# Patient Record
Sex: Female | Born: 1970 | Race: Black or African American | Hispanic: No | Marital: Single | Smoking: Former smoker
Health system: Southern US, Community
[De-identification: ages and names within clinical notes are randomized; demographics above are authoritative.]

## PROBLEM LIST (undated history)

## (undated) DIAGNOSIS — R011 Cardiac murmur, unspecified: Secondary | ICD-10-CM

## (undated) DIAGNOSIS — IMO0002 Reserved for concepts with insufficient information to code with codable children: Secondary | ICD-10-CM

## (undated) DIAGNOSIS — R87619 Unspecified abnormal cytological findings in specimens from cervix uteri: Secondary | ICD-10-CM

## (undated) DIAGNOSIS — R638 Other symptoms and signs concerning food and fluid intake: Secondary | ICD-10-CM

## (undated) DIAGNOSIS — Z8619 Personal history of other infectious and parasitic diseases: Secondary | ICD-10-CM

## (undated) DIAGNOSIS — A630 Anogenital (venereal) warts: Secondary | ICD-10-CM

## (undated) DIAGNOSIS — Z87898 Personal history of other specified conditions: Secondary | ICD-10-CM

## (undated) DIAGNOSIS — O9081 Anemia of the puerperium: Secondary | ICD-10-CM

## (undated) DIAGNOSIS — R87629 Unspecified abnormal cytological findings in specimens from vagina: Secondary | ICD-10-CM

## (undated) DIAGNOSIS — Z87440 Personal history of urinary (tract) infections: Secondary | ICD-10-CM

## (undated) DIAGNOSIS — J45909 Unspecified asthma, uncomplicated: Secondary | ICD-10-CM

## (undated) DIAGNOSIS — D219 Benign neoplasm of connective and other soft tissue, unspecified: Secondary | ICD-10-CM

## (undated) DIAGNOSIS — G43909 Migraine, unspecified, not intractable, without status migrainosus: Secondary | ICD-10-CM

## (undated) DIAGNOSIS — F53 Postpartum depression: Secondary | ICD-10-CM

## (undated) DIAGNOSIS — N926 Irregular menstruation, unspecified: Secondary | ICD-10-CM

## (undated) DIAGNOSIS — D649 Anemia, unspecified: Secondary | ICD-10-CM

## (undated) DIAGNOSIS — R51 Headache: Secondary | ICD-10-CM

## (undated) DIAGNOSIS — O99345 Other mental disorders complicating the puerperium: Secondary | ICD-10-CM

## (undated) DIAGNOSIS — E669 Obesity, unspecified: Secondary | ICD-10-CM

## (undated) DIAGNOSIS — R102 Pelvic and perineal pain: Secondary | ICD-10-CM

## (undated) DIAGNOSIS — R896 Abnormal cytological findings in specimens from other organs, systems and tissues: Secondary | ICD-10-CM

## (undated) HISTORY — DX: Personal history of urinary (tract) infections: Z87.440

## (undated) HISTORY — DX: Unspecified asthma, uncomplicated: J45.909

## (undated) HISTORY — DX: Postpartum depression: F53.0

## (undated) HISTORY — DX: Abnormal cytological findings in specimens from other organs, systems and tissues: R89.6

## (undated) HISTORY — DX: Obesity, unspecified: E66.9

## (undated) HISTORY — DX: Other symptoms and signs concerning food and fluid intake: R63.8

## (undated) HISTORY — DX: Anemia of the puerperium: O90.81

## (undated) HISTORY — DX: Anogenital (venereal) warts: A63.0

## (undated) HISTORY — DX: Personal history of other specified conditions: Z87.898

## (undated) HISTORY — DX: Pelvic and perineal pain: R10.2

## (undated) HISTORY — DX: Other mental disorders complicating the puerperium: O99.345

## (undated) HISTORY — DX: Personal history of other infectious and parasitic diseases: Z86.19

## (undated) HISTORY — DX: Migraine, unspecified, not intractable, without status migrainosus: G43.909

## (undated) HISTORY — PX: OTHER SURGICAL HISTORY: SHX169

## (undated) HISTORY — DX: Headache: R51

---

## 2000-06-23 ENCOUNTER — Encounter: Admission: RE | Admit: 2000-06-23 | Discharge: 2000-09-21 | Payer: Self-pay | Admitting: Family Medicine

## 2002-01-26 ENCOUNTER — Other Ambulatory Visit: Admission: RE | Admit: 2002-01-26 | Discharge: 2002-01-26 | Payer: Self-pay | Admitting: Family Medicine

## 2002-02-06 ENCOUNTER — Emergency Department (HOSPITAL_COMMUNITY): Admission: EM | Admit: 2002-02-06 | Discharge: 2002-02-06 | Payer: Self-pay | Admitting: Emergency Medicine

## 2002-02-06 ENCOUNTER — Encounter: Payer: Self-pay | Admitting: Emergency Medicine

## 2003-03-17 ENCOUNTER — Other Ambulatory Visit: Admission: RE | Admit: 2003-03-17 | Discharge: 2003-03-17 | Payer: Self-pay | Admitting: Family Medicine

## 2005-02-08 ENCOUNTER — Encounter: Admission: RE | Admit: 2005-02-08 | Discharge: 2005-02-08 | Payer: Self-pay | Admitting: Family Medicine

## 2006-12-23 DIAGNOSIS — IMO0001 Reserved for inherently not codable concepts without codable children: Secondary | ICD-10-CM

## 2006-12-23 HISTORY — DX: Reserved for inherently not codable concepts without codable children: IMO0001

## 2007-01-29 ENCOUNTER — Inpatient Hospital Stay (HOSPITAL_COMMUNITY): Admission: AD | Admit: 2007-01-29 | Discharge: 2007-01-29 | Payer: Self-pay | Admitting: Obstetrics and Gynecology

## 2007-04-07 ENCOUNTER — Inpatient Hospital Stay (HOSPITAL_COMMUNITY): Admission: AD | Admit: 2007-04-07 | Discharge: 2007-04-08 | Payer: Self-pay | Admitting: Obstetrics and Gynecology

## 2007-05-04 ENCOUNTER — Inpatient Hospital Stay (HOSPITAL_COMMUNITY): Admission: AD | Admit: 2007-05-04 | Discharge: 2007-05-04 | Payer: Self-pay | Admitting: Obstetrics and Gynecology

## 2007-07-04 ENCOUNTER — Inpatient Hospital Stay (HOSPITAL_COMMUNITY): Admission: AD | Admit: 2007-07-04 | Discharge: 2007-07-09 | Payer: Self-pay | Admitting: Obstetrics and Gynecology

## 2007-07-04 ENCOUNTER — Encounter (INDEPENDENT_AMBULATORY_CARE_PROVIDER_SITE_OTHER): Payer: Self-pay | Admitting: Obstetrics and Gynecology

## 2007-07-04 HISTORY — PX: TUBAL LIGATION: SHX77

## 2007-07-04 HISTORY — PX: OTHER SURGICAL HISTORY: SHX169

## 2007-07-29 DIAGNOSIS — Z87440 Personal history of urinary (tract) infections: Secondary | ICD-10-CM

## 2007-07-29 HISTORY — DX: Personal history of urinary (tract) infections: Z87.440

## 2007-08-03 ENCOUNTER — Inpatient Hospital Stay (HOSPITAL_COMMUNITY): Admission: AD | Admit: 2007-08-03 | Discharge: 2007-08-03 | Payer: Self-pay | Admitting: Obstetrics and Gynecology

## 2007-08-05 ENCOUNTER — Inpatient Hospital Stay (HOSPITAL_COMMUNITY): Admission: AD | Admit: 2007-08-05 | Discharge: 2007-08-05 | Payer: Self-pay | Admitting: Obstetrics and Gynecology

## 2007-08-08 ENCOUNTER — Inpatient Hospital Stay (HOSPITAL_COMMUNITY): Admission: AD | Admit: 2007-08-08 | Discharge: 2007-08-08 | Payer: Self-pay | Admitting: Obstetrics and Gynecology

## 2007-08-09 ENCOUNTER — Inpatient Hospital Stay (HOSPITAL_COMMUNITY): Admission: AD | Admit: 2007-08-09 | Discharge: 2007-08-09 | Payer: Self-pay | Admitting: Obstetrics and Gynecology

## 2007-08-18 DIAGNOSIS — O9081 Anemia of the puerperium: Secondary | ICD-10-CM

## 2007-08-18 DIAGNOSIS — F53 Postpartum depression: Secondary | ICD-10-CM

## 2007-08-18 HISTORY — DX: Postpartum depression: F53.0

## 2007-08-18 HISTORY — DX: Anemia of the puerperium: O90.81

## 2007-08-24 DIAGNOSIS — Z87898 Personal history of other specified conditions: Secondary | ICD-10-CM

## 2007-08-24 HISTORY — DX: Personal history of other specified conditions: Z87.898

## 2008-07-04 HISTORY — PX: MYOMECTOMY: SHX85

## 2011-05-07 NOTE — H&P (Signed)
NAMECONNER, NEISS NO.:  [id number]   MEDICAL RECORD NO.:  [id number]          PATIENT TYPE:  INP   LOCATION:  9304                          FACILITY:  [facility]   PHYSICIAN:  Hal Morales, M.D.DATE OF BIRTH:  [birth year]   DATE OF ADMISSION:  07/04/2007  DATE OF DISCHARGE:                              HISTORY & PHYSICAL   PRIORITY HISTORY AND PHYSICAL   Ms. Gossett is a 40 year old, gravida 4, para 0-0-3-0, at 59 and 4/7th  weeks, who presented this afternoon with regular uterine contractions  since last night.  She denies any leaking or bleeding and reports  positive fetal movement.  She also denies any headache, visual symptoms,  or epigastric pain.  Pregnancy has been remarkable for:  1)  a large  fibroid in lower uterine segment approximately 12 cm x 10 cm on  ultrasound this week.  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, GC and  Chlamydia cultures were negative, hemoglobin on primary entry into our  practice was 10.4, it was 9.1 in April, group-B Strep culture was  negative at 36 weeks, first trimester screening was normal, AFT was  normal, one-hour Glucola was 84 at 18 weeks, followup at 28 weeks was  normal, a 24-hour urine was done at 23 to 24 weeks secondary to a mild  elevation in blood pressure, she had a fetofibronectin done at 25 weeks  for cramping, the cervix was also shortened at that time, LFTs were  slightly elevated on April 11th at 46 SGOT and 53 SGPT, hepatitis  screening was normal, GC and Chlamydia cultures were both negative at 25  weeks, followup liver function tests showed SGOT at 52, SGPT was  negative, she had a one-hour GTT at 26 weeks that was normal, TSH was  done at 27 weeks and it was 2.052, fetofibronectin was negative on May  12th.  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  She had an ultrasound earlier this week  showing transverse lie and a large fibroid.   HISTORY OF PRESENT PREGNANCY:  Patient entered care at approximately  five-and-a-half to six weeks gestation.  She had an ultrasound showing  growth with an EDC of August 4th.  That was her first return at new OB  visit.  No fibroids were noted at that time; however, she did have high-  risk HPV noted  on her Pap with ASCUS.  Her mother did pass away during  her pregnancy at 12 weeks.  She did have some depression early but  declined any medication.  She had a colposcopy at 14 weeks.  Plan was  made to repeat her Pap at 36 weeks.  She received IV fluids at 14 weeks.  She had an ultrasound at 18 weeks showing normal growth, she had a  positive echogenic intracardiac focus, she had two large fibroids of 12  cm and 9 cm, one-hour Glucola was normal.  She had cramping at 22 weeks.  She was placed on Vicodin at that time.  She had a slightly elevated  blood pressure at 23 weeks.  Labs showed elevated LFTs.  A 24-hour urine  was done and was normal.  She had another ultrasound at 24 weeks showing  a lower uterine segment anterior fibroid.  The cervix was 1.81 cm  dilated and 1.43 cm with funneling.  She received febrofibonectin at  that time and betamethasone was given.  Hepatitis testing was done and  was negative.  GC and Chlamydia  cultures were negative at 25 weeks.  Her  LFTs were repeated and SGPT was normal, SGOT was still slightly elevated  at 52.  Fetofibronectin was negative at 24 weeks.  She was out of work  during that time when her cervix was shortened.  She was allowed to  return to work at approximately 26 weeks.  One-hour Glucola and LFTs  were done at 26 weeks.  Her one-hour Glucola was normal, her TSH was  normal, LFTs had been resolved.  She was on some terbutaline at 30  weeks.  She had a negative fetofibronectin at that time.  The cervix was  long, closed, and high.  She did desire a tubal sterilization.  This was  reviewed at 30 week with the patient.  She was having lots of pelvic  pain at 32 weeks.  Urine was sent to culture.  She had an ultrasound at  32 weeks showing a transverse lie, fibroids were 14 cm noted, the cervix  was 2.72 cm.  She had Trichomonas diagnosed at that time on wet prep.  She had all STD testing done, which showed a positive HSV-1 and HSV-2  titers, but no lesions.  She was given Ambien at 33 weeks.  Urine  culture was negative.  She had an ultrasound this past week showing  normal growth, but one fibroid was noted that was 12 x 10 cm long.  A  C-  section was discussed with the patient at that time, but no definitive  plans had been made.   OBSTETRICAL HISTORY:  In 1992, she had a termination at six to eight  weeks.  In 1999, she had a termination at eight weeks.  In 2003, she had  a termination at six weeks.   PAST MEDICAL HISTORY:  1. She is a previous pills and Depo-Provera user.  2. She was diagnosed with anemia several years ago but has never been      on any iron supplements.  3. She has had a recent bladder infection.  4. She has migraines and has been on several meds in the past but has      not used any medications during her pregnancy.  5. The patient was a previous smoker.   The patient has no known medication allergies.   FAMILY HISTORY:  Her mother,  father, sister, an aunt, and some first  cousins all have hypertension.  Her mother had varicosities.  Her  maternal aunt had diabetes.  Her mother had breast cancer and did pass  away during the pregnancy.  Her father has had prostate cancer.   GENETIC HISTORY:  Remarkable in the patient having a heart murmur at  birth.  The father of the baby has fathered twins before, and the  patient's sister has twins.   SOCIAL HISTORY:  The patient is African-American, she is of the  Apostolic faith, she is single, the father of the baby has been  sporadically involved, the patient has two years of college, she is a  Clinical biochemist rep, her partner is a Technical sales engineer.  She has been followed  initially by the certified midwife nurse service but did transfer to the  physician service during her pregnancy.  She denies any alcohol, drug,  or tobacco use during this pregnancy.   PHYSICAL EXAMINATION:  VITAL SIGNS:  Stable; the patient is afebrile.  HEENT:  Within normal limits.  LUNGS:  Her breath sounds are clear.  HEART:  Regular rate and rhythm without murmur.  BREASTS:  Soft and nontender.  ABDOMEN:  Fundal height is approximately 42 cm, and the fetal weight 6.5  to 7.5 pounds.  The fetus is transverse by a bedside ultrasound.  The  cervix is fingertip, 50%, with the presenting part high, fetal heart  rate was initially reactive but then was nonreactive after patient  received Stadol.  There were no decelerations noted.  Uterine  contractions initially were approximately every 4 minutes and remained  so despite Stadol and IV hydration.  Group-B Strep culture was negative  at 36 weeks.  There are no HSV lesions noted.  Hemoglobin today is 10.6.   IMPRESSION:  1. Intrauterine pregnancy at 70 and 4/7th weeks.  2. Probable early labor.  3. Transverse lie.  4. Large fibroid.  5. Group B Streptococcus negative.  6. Desires tubal sterilization.   PLAN:  1. Admit to [facility]  with a consult by Dr.      Pennie Rushing as attending physician.  2. Routine physician preoperative orders.  3. Dr. Pennie Rushing reviewed with the patient and her family the risks and      benefits of C-section including bleeding, infection, and damage to      other organs.  C-section was recommended in light of the patient's      transverse lie and probable early labor as well as the large      fibroid.  Dr. Pennie Rushing also reviewed with the patient the risks of      bleeding from the fibroid during the C-section, and a Hold-A-Clot      was also obtained.      Renaldo Reel Emilee Hero, C.N.M.      Hal Morales, M.D.  Electronically Signed    VLL/MEDQ  D:  07/04/2007  T:  07/05/2007  Job:  [id number]

## 2011-05-07 NOTE — Consult Note (Signed)
Ellen Hill, MAHN NO.:  [id number]   MEDICAL RECORD NO.:  [id number]          PATIENT TYPE:  MAT   LOCATION:  MATC                          FACILITY:  [facility]   PHYSICIAN:  Janine Limbo, M.D.DATE OF BIRTH:  05/05/1971   DATE OF CONSULTATION:  08/03/2007  DATE OF DISCHARGE:  08/03/2007                                 CONSULTATION   HISTORY OF PRESENT ILLNESS:  The patient is a 40 year old female, now  para 0-1-3-1, who has been followed at the [facility] for women.  The the patient had a  classical cesarean section with bilateral tubal ligation and myomectomy  on July 05, 2007.  She presented to [facility] at that time in  labor at 36 weeks and 5 days.  Her infant was noted to be in a  transverse lie.  The patient was noted to have several large fibroids  measuring 10 x 10 cm and 12 x 10 cm.  A myomectomy was required to stop  her bleeding.  The patients postoperative hemoglobin was 9.3.  A repeat  hemoglobin was 7.6.  The patient was discharged to home doing well.  The  patient reports that for the past week she has had increasing right  lower quadrant pain.  She denies fever and chills.  She denies any  drainage from her incision.  She was seen in the office 1 week ago and  was noted to have a healing incision.  However, upon presentation to the  office today she was noted to have some induration at her incision and  significant right lower quadrant pain.   OBSTETRICAL HISTORY:  On July 05, 2007 the patient had a classical  cesarean section, tubal ligation, and myomectomy at 36 weeks and 5 days  gestation.  She had three first trimester elective pregnancy  terminations (1992, 1999, 2003).   DRUG ALLERGIES:  NO KNOWN DRUG ALLERGIES.   PAST MEDICAL HISTORY:  The patient has a history of anemia even when she  is not pregnant.  She has a history of migraine headaches.  She has a  history of obesity.  Her  height is 5 feet 6 inches.  Her weight during  her pregnancy was noted to be as high as 330 pounds.  The patient has a  past history of herpes virus.  She has fibroids as mentioned in the  history of present illness.   SOCIAL HISTORY:  The patient denies smoking when she was pregnant but  she was a previous smoker.  She denies alcohol use and recreational drug  use.   REVIEW OF SYSTEMS:  Please see history of present illness.  She has  tolerated a regular diet.  She denies constipation, diabetes, nausea,  and vomiting.   FAMILY HISTORY:  Is noncontributory.   PHYSICAL EXAM:  VITAL SIGNS:  Temperature is 92, blood pressure is  136/99, pulse 96, respirations 22.  HEENT:  Within normal limits.  CHEST:  Clear.  HEART:  Regular rate and rhythm.  ABDOMEN:  Soft and there is tenderness in  the right lower quadrant.  She  has a very large pannus and when you lift the pannus you can see a well-  healed incision.  There is no evidence of infection for discharge.  There is some brawny induration at the incision and there is some edema  at the mons area where there is dependent fluid.  EXTREMITIES:  Grossly normal.  NEUROLOGY:  Grossly normal.  PELVIC:  Deferred.   Weight is 301 pounds today.   LABORATORY VALUES:  Hemoglobin is 8.0.  White blood cell count is  12,100, platelet count of 389,000.   EMERGENCY ROOM COURSE:  The patient was monitored in the emergency  department.  Her blood work was done.  The patient stated that she had  borrowed a friend's car and that she had to leave to pick up her child.  I had discussed trying to do an ultrasound or CT scan to better evaluate  the integrity of her incision and the subcutaneous area.  The patient  reported that she was unable to stay and that she would plan to return  to the office on August 04, 2007 for evaluation at that time.   ASSESSMENT:  1. Right lower quadrant pain of uncertain etiology.  2. Possible skin infection.  3.  Obesity.  4. Status post classical cesarean section, bilateral tubal ligation,      and myomectomy on July 05, 2007.  5. Significant anemia (consistent with anemia following her cesarean      section).   PLAN:  The patient was told to follow-up in the office as she has  dictated.   DISCHARGE MEDICATIONS:  1. She was given a prescription for Keflex and she will take 500 mg      three times each day for 10 days.  2. She was given a prescription for ibuprofen and she will take 600 mg      every 6 hours as needed for pain.  3. She was given a prescription for Percocet 5/325 and she will take 1-      2 tablets every 4 hours as needed for pain.   The patient was told to call and return to maternity admissions tonight  if she finds that her symptoms worsen.      Janine Limbo, M.D.  Electronically Signed     AVS/MEDQ  D:  08/03/2007  T:  08/04/2007  Job:  [id number]

## 2011-05-07 NOTE — Discharge Summary (Signed)
Ellen Hill, Ellen Hill NO.:  [id number]   MEDICAL RECORD NO.:  [id number]          PATIENT TYPE:  INP   LOCATION:  9304                          [facility]   PHYSICIAN:  Hal Morales, M.D.DATE OF BIRTH:  [birth year]   DATE OF ADMISSION:  07/04/2007  DATE OF DISCHARGE:  07/09/2007                               DISCHARGE SUMMARY   ADMISSION DIAGNOSIS:  1. Intrauterine pregnancy at 36-4/7 weeks.  2. Early labor.  3. Transverse lie.  4. Large fibroid.  5. Group B strep negative.   DISCHARGE DIAGNOSES:  1. Intrauterine pregnancy at 36-4/7 weeks.  2. Early labor.  3. Transverse lie.  4. Large fibroid.  5. Group B strep negative.  6. Two large anterior fibroids.  7. Desires sterilization.  8. Elevated liver function tests.  9. NICU infant.   HOSPITAL PROCEDURES:  1. Electronic fetal monitoring.  2. Primary classical cesarean section.  3. Multiple myomectomy.  4. Bilateral tubal ligation.   HOSPITAL COURSE:  The patient was admitted with contractions and she had  a known large fibroid on her uterus and a transverse presentation of her  baby and decision was made to proceed with cesarean section for delivery  with myomectomy. Upon entering the abdominal cavity two large anterior  fibroids, weighing a total of 949 grams were noted and had to be removed  in order to deliver the baby. Classical cesarean section incision had to  be done on the uterus in order to achieve delivery. Bilateral tubal  ligation was also done.  There were no complications.  Baby a female  baby was delivered weighing 4 pounds 9 ounces, Apgars nine and nine. The  patient was taken to recovery and then to the third floor and the baby  was taken to NICU. On postop day #1 the patient was doing well.  She was  afebrile.  JP was draining a small amount of blood. Chemistry showed an  elevated AST of 161 and an ALT of 191.  Other labs were normal.  She had  some low urine output  secondary to dehydration that was treated with  fluids. On postop day #2 she was doing well, infant was stable in the  NICU, pain was well controlled and IV was discontinued and the patient  got shower. She declined transfusion for hemoglobin of 7.6. Postop day  #3 the patient continued to do well, baby was stable in the NICU.  She  was afebrile.  AST went down 49 and ALT went down to 76, exam was within  normal limits and she had some eczema rash that was treated with  hydrocortisone cream. On postop day #4 the baby was still in NICU and  was stable, orthostatic vital signs were normal and physical exam was  normal.  She continued to receive routine care.  On postop day #5 she  was doing well and ready to go home.  Baby was going to be going home  today, vital signs were stable.  She was afebrile.  Abdomen was soft and  nontender.  Chest was clear.  Heart  rate; regular rate and rhythm.  Incision clean, dry and intact.  Staples were removed with Steri-Strips  placed.  JP drain was removed.  Lochia was within normal limits.  Extremities were within normal limits except for 1+ edema. CMET was  normal except for AST of 74 and ALT of 77. The patient was deemed to  receive full benefit of her hospital stay and was discharged home.   DISCHARGE MEDICATIONS:  1. Motrin 600 mg p.o. q.6 hours p.r.n.  2. Tylox 1-2 p.o. q.4 hours p.r.n.  3. Lasix 20 mg one p.o. daily.  4. Potassium chloride 10 mEq one p.o. daily.   DISCHARGE LABS:  Sodium 142, potassium 3.4, creatinine 0.62, AST 74, ALT  77.  CBC showed a hemoglobin 6.9, platelets 193.   DISCHARGE INSTRUCTIONS:  Per [facility] handout.  Discharge follow up in 6  weeks or p.r.n.      Marie L. Williams, C.N.M.      Hal Morales, M.D.  Electronically Signed    MLW/MEDQ  D:  07/09/2007  T:  07/09/2007  Job:  [id number]

## 2011-05-07 NOTE — Op Note (Signed)
Ellen Hill, COLCORD                ACCOUNT NO.:  [id number]   MEDICAL RECORD NO.:  [id number]          PATIENT TYPE:  INP   LOCATION:  9304                          [facility]:  [facility]   PHYSICIAN:  Hal Morales, M.D.DATE OF BIRTH:  Jun 25, 1971   DATE OF PROCEDURE:  07/04/2007  DATE OF DISCHARGE:                               OPERATIVE REPORT   PREOPERATIVE DIAGNOSES:  Intrauterine pregnancy at 36 weeks and 5 days,  preterm labor transverse fetal lie, desire for surgical sterilization,  uterine fibroids and pelvic pain.   POSTOPERATIVE DIAGNOSES:  Intrauterine pregnancy at 36 weeks and 5 days,  preterm labor transverse fetal lie, desire for surgical sterilization,  uterine fibroids and pelvic pain.   OPERATION:  Multiple myomectomy, primary classical cesarean section.   SURGEON:  Dr. Dierdre Forth   FIRST ASSISTANT:  Dr. Christin Bach   SECOND ASSISTANT:  Erin Sons, certified nurse midwife.   ANESTHESIA:  Epidural   ESTIMATED BLOOD LOSS:  1000 mL.   COMPLICATIONS:  None.   FINDINGS:  The uterus was enlarged for the gravid state.  On the  anterior surface of the uterus obstructing the entire anterior fundus  were two large myoma, the first measuring 10 x 10 cm and the second  measuring 12 x 10 cm.  The tubes and ovaries were normal for the gravid  state.  The patient was delivered of a female infant weighing 4 pounds  and 9 ounces with Apgars of 9 and 9 at 1 and 5 minutes respectively.  There were several other very small myomata throughout the uterine  fundus.   PROCEDURE:  The patient was taken to the operating room after  appropriate identification and placed on the operating table.  After the  placement of an epidural anesthetic she was placed in the supine  position with a left lateral tilt.  The abdomen was taped to allow  exposure to the suprapubic region.  The abdomen and perineum were  prepped multiple layers of Betadine and a Foley catheter inserted into  the bladder and connected to straight drainage.  The abdomen was draped  as a sterile field.  After the assurance of adequate surgical anesthesia  the suprapubic region was infiltrated with 20 mL of quarter percent  Marcaine.  A suprapubic incision was made and the abdomen opened in  layers.  The peritoneum was entered and upon visualizing the large  myomata were obstructing the anterior uterus.  It was clear that  myomectomy would have to be performed in order to allow for delivery.  The serosa of the myomas were infiltrated with a dilute solution of  Pitressin and incised.  The myoma were shelled out of their overlying  serosa and a Kelly clamp placed over the base of the pedicle attaching  the myomata to the anterior uterus.  The myomata were then removed from  the operative field.  The pedicles were oversewn with figure-of-eight  sutures of 0 Vicryl to allow for adequate hemostasis.  It was noted that  there was no presenting part in the lower uterine segment and that the  lower uterine segment was very poorly developed with minimal width for  an incision.  It was decided to proceed with a classical incision which  was made in the midline of the fundus.  This extended toward the bladder  flap.  The membranes were then ruptured with the egress of clear fluid.  The infant was delivered from a frank breech position, having been  transverse on entry into the uterus.  The nares and pharynx were  suctioned.  The cord clamped and cut.  She was handed off to the  awaiting pediatricians.  The placenta was manually removed from the  uterus.  Uterine cavity was cleared of products of conception.  The  uterine incision was then closed in three layers the first figure-of-  eight sutures.  A second figure-of-eight mattress sutures and the third  a running interlocking serosal suture of all of 0 Vicryl.  Further  hemostasis was then achieved along the entire incision with interrupted  mattress  sutures of 0 Vicryl.  The left fallopian tube was then  identified, followed to its fimbriated end, and grasped at the isthmic  portion and elevated.  A suture of 2-0 chromic was placed through the  mesosalpinx and tied fore and aft on the knuckle of tube.  A second  ligature was placed proximal to that.  The intervening knuckle of tube  was excised in the cut ends cauterized.  A similar procedure was carried  out on the opposite side.  The portions of right and left tube were  removed from the operative field.  At this time the uterus which had  been exteriorized from the abdominal incision was replaced and copious  irrigation carried out.  A last hemostatic suture was placed of 0 Vicryl  along the C-section incision line and hemostasis deemed been adequate.  A piece of intercede was placed along the entire length of the uterine  incision.  The abdominal peritoneum was then closed with a running  suture of 2-0 Vicryl.  The rectus fascia was closed with running sutures  of 0 Vicryl from each apex to the midline and tied in the midline.  The  subcutaneous tissue was copiously irrigated and made hemostatic with  Bovie cautery.  A 10 mm Jackson-Pratt drain was placed through a stab  wound in the left lower quadrant.  It was sewn in with a suture of 0-0  silk.  The skin incision was then closed with staples.  A grenade was  attached to the drain.  Sterile dressings were applied and the patient  taken from the operating room to the recovery room in satisfactory  condition having tolerated the procedure well with sponge and instrument  counts correct.   SPECIMENS TO PATHOLOGY:  Two large myomas and the placenta.  The infant  went to the full-term nursery.  Initially, however, was transferred to  the neonatal intensive care unit.      Hal Morales, M.D.  Electronically Signed     VPH/MEDQ  D:  07/05/2007  T:  07/05/2007  Job:  [id number]

## 2011-05-10 NOTE — H&P (Signed)
NAMEASHBY, LEFLORE NO.:  [id number]   MEDICAL RECORD NO.:  [id number]          PATIENT TYPE:  INP   LOCATION:  9155                          FACILITY:  [facility]   PHYSICIAN:  Janine Limbo, M.D.DATE OF BIRTH:  Aug 23, 1971   DATE OF ADMISSION:  04/07/2007  DATE OF DISCHARGE:                              HISTORY & PHYSICAL   Ms. Lewers is a 40 year old gravida 4, para 0-0-3-0 who presents at 23-  1/[redacted] weeks gestation, EDD July 28, 2007.  She presents from the office  of [facility] with complaint of contractions and cramping.  Ultrasound  examination done today finds cervical length shortened at 1.81 cm with  funneling. The patient was found to have elevated liver function tests  on April 03, 2007 with SGOT 46 and SGPT 53.  Her blood pressures in  maternity admissions have been normal.  She is admitted for continuous  monitoring.  She is to receive betamethasone 12.5 mg today to be  repeated in 24 hours. She will have a gallbladder ultrasound on  admission secondary to elevated LFTs. Her pregnancy has been followed by  the M.D. service at [facility] and is remarkable for:  1. Obesity.  The patient's weight is 332 pounds, height is 5 feet 6-      1/2 inches.  2. History of three elective AB's.  3. Fibroids.  4. Migraines.  5. Elevated liver function tests.  6. Shortened cervix on ultrasound with funneling.   This patient began prenatal care at the office of [facility] on December 02, 2007 at [redacted] weeks gestation, [facility] determined by first trimester ultrasound  and confirmed with follow-up.  Her pregnancy has been remarkable for  fibroids with increased cramping and pain.  She has had proteinuria and  isolated elevated blood pressures.   PRENATAL LAB WORK:  On December 01, 2006 hemoglobin and hematocrit 10.4  and 32.8, platelets 299,000.  Blood type and Rh A+, antibody screen  negative, VDRL nonreactive, rubella immune, hepatitis B surface antigen  negative, HIV nonreactive.  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  CF testing is negative.   OB HISTORY:  In 1992 the patient had a first trimester elective AB with  no complications. In 1999 first trimester elective AB with no  complications. In 2003 the patient had a first trimester elective AB  with no complications.  This is her fourth and current pregnancy.   MEDICAL HISTORY:  Is significant for fibroids, migraines and obesity.   FAMILY HISTORY:  The patient's mother, father, sister and aunts with  chronic hypertension. The patient's mother with varicose veins. The  patient's mother has breast cancer and father has prostate cancer.   GENETIC HISTORY.:  There is no genetic history of familial or  chromosomal disorders, children that were born with birth defects or any  that died in infancy.   ALLERGIES:  The patient has no known drug allergies.   She stopped cigarette smoking with positive EPT and denies the use of  alcohol and illicit drugs.   SOCIAL HISTORY:  Ms. Barley is a  40 year old African American single  female.  She works as a Occupational psychologist. The father of  the baby, Araceli Bouche is a Technical sales engineer. He is involved and supportive.  They are Apostolic in their faith.   REVIEW OF SYSTEMS:  Is as described above.  The patient is 24 weeks and  1 day gestation with shortened cervix to 1.81 cm on ultrasound  examination today at the office of [facility] with funneling. She complains of  cramping and has a history of elevated liver function tests and is here  for evaluation.   PHYSICAL EXAM:  VITAL SIGNS:  The patient is afebrile.  Blood pressure  117/79.  HEENT:  Unremarkable.  HEART:  Regular rate and rhythm.  LUNGS:  Clear.  ABDOMEN:  Soft and nontender.  It is gravid in its contour.  Uterine  fundus is noted to extend 24 cm above the level of the pubic symphysis.  Baseline of fetal heart rate monitor is 140-150 with positive  variability and is reassuring. There is negative  CVA tenderness noted  bilaterally.  Digital exam of the cervix done by Dr. Normand Sloop at the  office of [facility] found the cervix to be closed. Fetal fibronectin was  obtained.  EXTREMITIES:  Show no pathologic edema.  DTRs are 1+ with no clonus.  There is no calf tenderness noted bilaterally.   ASSESSMENT:  Intrauterine pregnancy at [redacted] weeks gestation, shortened  cervix to 1.81 cm with funneling on ultrasound, abdominal cramping,  elevated liver function tests.   PLAN:  Admit per Dr. Marline Backbone for continuous monitoring,  betamethasone 12.5 mg IM today and repeat in 24 hours. The patient will  begin Motrin 800 mg p.o. q.8 h for cramping.  She is admitted for  observation.      Rica Koyanagi, C.N.M.      Janine Limbo, M.D.  Electronically Signed    SDM/MEDQ  D:  04/07/2007  T:  04/08/2007  Job:  [id number]

## 2011-05-10 NOTE — Discharge Summary (Signed)
Ellen Hill, Ellen Hill NO.:  [id number]   MEDICAL RECORD NO.:  [id number]          PATIENT TYPE:  MAT   LOCATION:  [facility]                          [facility]ILITY:  [facility]   PHYSICIAN:  Janine Limbo, M.D.DATE OF BIRTH:  November 01, 1971   DATE OF ADMISSION:  05/04/2007  DATE OF DISCHARGE:  05/04/2007                               DISCHARGE SUMMARY   DISCHARGE DIAGNOSES:  1. Intrauterine gestation at [redacted] weeks gestation.  2. No evidence of preterm labor.  3. Preterm uterine irritability.  4. Fibroid uterus.   PROCEDURES THIS ADMISSION:  Bone.   HISTORY OF PRESENT ILLNESS:  Ellen Hill is a 40 year old female, gravida  4, para 0-3-0, who presented at [redacted] weeks gestation complaining of lower  abdominal cramping and vaginal pain of 1 day duration.  The patient has  been followed at the [facility]  of [facility].  The patient's pregnancy is  significant for a shortened cervix for which she received betamethasone  at [redacted] weeks gestation.  She was hospitalized for 2 days and observation.  She has a past history of 3 elective pregnancy terminations.  She has a  known history of fibroids.   PHYSICAL EXAMINATION:  VITAL SIGNS:  Stable and the patient is afebrile.  Fetal monitoring showed a assuring fetal heart rate tracing bit it was  difficult to trace the infant because of the body habitus of the  patient.  She was noted to have mild irritability of the uterus.   EMERGENCY ROOM COURSE:  The patient was tested for a urinary tract  infection and it was noted to be negative.  Her fetal fibronectin was  negative.  An ultrasound was performed that showed a transverse infant.  The cervix was 4.22 cm in length.  The amniotic fluid volume was normal.  The biophysical profile was 8 out of 8.  The patient was noted to have  two large fibroids, each measuring greater than 10 cm in size.  The  patient was given subcu terbutaline and  the uterine irritability  resolved.  After observation for several hours we felt that the patient  was ready for discharge.  She will follow up in the office or call  should she have any further problems.  She will take terbutaline 5 mg  every 4 to 6 hours as needed for uterine cramping.      Janine Limbo, M.D.  Electronically Signed     AVS/MEDQ  D:  05/22/2007  T:  05/22/2007  Job:  [id number]

## 2011-05-20 ENCOUNTER — Ambulatory Visit (INDEPENDENT_AMBULATORY_CARE_PROVIDER_SITE_OTHER): Payer: Self-pay

## 2011-05-20 ENCOUNTER — Inpatient Hospital Stay (INDEPENDENT_AMBULATORY_CARE_PROVIDER_SITE_OTHER)
Admission: RE | Admit: 2011-05-20 | Discharge: 2011-05-20 | Disposition: A | Discharge status: Home / Self Care | Payer: Self-pay | Source: Ambulatory Visit | Attending: Family Medicine | Admitting: Family Medicine

## 2011-05-20 DIAGNOSIS — J45909 Unspecified asthma, uncomplicated: Secondary | ICD-10-CM

## 2011-08-02 ENCOUNTER — Emergency Department (HOSPITAL_BASED_OUTPATIENT_CLINIC_OR_DEPARTMENT_OTHER)
Admission: EM | Admit: 2011-08-02 | Discharge: 2011-08-02 | Disposition: A | Discharge status: Home / Self Care | Payer: Self-pay | Attending: Emergency Medicine | Admitting: Emergency Medicine

## 2011-08-02 DIAGNOSIS — N898 Other specified noninflammatory disorders of vagina: Secondary | ICD-10-CM | POA: Insufficient documentation

## 2011-08-02 DIAGNOSIS — R109 Unspecified abdominal pain: Secondary | ICD-10-CM | POA: Insufficient documentation

## 2011-08-02 DIAGNOSIS — N939 Abnormal uterine and vaginal bleeding, unspecified: Secondary | ICD-10-CM

## 2011-08-02 LAB — URINALYSIS, ROUTINE W REFLEX MICROSCOPIC
Bilirubin Urine: NEGATIVE
Ketones, ur: NEGATIVE mg/dL
Leukocytes, UA: NEGATIVE
Nitrite: NEGATIVE
Protein, ur: NEGATIVE mg/dL

## 2011-08-02 LAB — BASIC METABOLIC PANEL
BUN: 12 mg/dL (ref 6–23)
Calcium: 9.4 mg/dL (ref 8.4–10.5)
Creatinine, Ser: 0.7 mg/dL (ref 0.50–1.10)
GFR calc Af Amer: >60 mL/min (ref >60)
GFR calc non Af Amer: >60 mL/min (ref >60)

## 2011-08-02 LAB — CBC
MCH: 22.9 pg — ABNORMAL LOW (ref 26.0–34.0)
MCHC: 31.6 g/dL (ref 30.0–36.0)
MCV: 72.3 fL — ABNORMAL LOW (ref 78.0–100.0)
Platelets: 215 10*3/uL (ref 150–400)
RDW: 16.7 % — ABNORMAL HIGH (ref 11.5–15.5)
WBC: 7.3 10*3/uL (ref 4.0–10.5)

## 2011-08-02 MED ORDER — HYDROCODONE-ACETAMINOPHEN 7.5-325 MG PO TABS: 1.0000 | ORAL_TABLET | Freq: Four times a day (QID) | ORAL | Status: AC | PRN

## 2011-08-02 NOTE — ED Notes (Signed)
Pt c/o abdominal pain and heavy vaginal bleeding that started "after my child was born in 2008" and symptoms have worsened the past 6 months.  Pt has not seen an OBGYN and she states she bleeds daily.

## 2011-08-02 NOTE — ED Provider Notes (Signed)
History     CSN: [id number] Arrival date & time: 08/02/2011  6:03 PM  Chief Complaint  Patient presents with  . Abdominal Pain  . Vaginal Bleeding   HPI Comments: Pt reports since 2008 she has been having increasing vaginal bleeding. She saturates multiple pads and tampons. She passes clots almost daily. She has cramping sharpe pain in the lower left abd. She has not been evaluated for this problem. She came to ED tonight because family required her to come. She c/o feeling weak and lightheaded at times.  Patient is a 40 y.o. female presenting with abdominal pain and vaginal bleeding. The history is provided by the patient.  Abdominal Pain The primary symptoms of the illness include abdominal pain and vaginal bleeding. The primary symptoms of the illness do not include shortness of breath or dysuria. The current episode started more than 2 days ago. The onset of the illness was gradual. The problem has not changed since onset. Vaginal bleeding was first noticed more than 2 days ago. Vaginal bleeding other than menses is a chronic problem. Vaginal bleeding occurred more than 10 times. The quantity of blood was heavier than menses.  The patient has not had a change in bowel habit. Additional symptoms associated with the illness include diaphoresis. Symptoms associated with the illness do not include chills, anorexia, hematuria, frequency or back pain. Significant associated medical issues do not include PUD, GERD, inflammatory bowel disease, diabetes, gallstones, liver disease, diverticulitis, HIV or cardiac disease.  Vaginal Bleeding Associated symptoms include abdominal pain and diaphoresis. Pertinent negatives include no anorexia, arthralgias, chest pain, chills, coughing or neck pain.    History reviewed. No pertinent past medical history.  Past Surgical History  Procedure Date  . Fibroidecto   . Fibroidectomy   . Tubal ligation   . Cesarean section     History reviewed. No pertinent  family history.  History  Substance Use Topics  . Smoking status: Never Smoker   . Smokeless tobacco: Not on file  . Alcohol Use: No    OB History    Grav Para Term Preterm Abortions TAB SAB Ect Mult Living                  Review of Systems  Constitutional: Positive for diaphoresis. Negative for chills and activity change.       All ROS Neg except as noted in HPI  HENT: Negative for nosebleeds and neck pain.   Eyes: Negative for photophobia and discharge.  Respiratory: Negative for cough, shortness of breath and wheezing.   Cardiovascular: Negative for chest pain and palpitations.  Gastrointestinal: Positive for abdominal pain. Negative for blood in stool and anorexia.  Genitourinary: Positive for vaginal bleeding. Negative for dysuria, frequency and hematuria.  Musculoskeletal: Negative for back pain and arthralgias.  Skin: Negative.   Neurological: Negative for dizziness, seizures and speech difficulty.  Psychiatric/Behavioral: Negative for hallucinations and confusion.    Physical Exam  BP 154/103  Pulse 92  Temp(Src) 99.4 F (37.4 C) (Oral)  Resp 20  Ht 5\' 6"  (1.676 m)  Wt 370 lb (167.831 kg)  BMI 59.72 kg/m2  SpO2 100%  Physical Exam  Nursing note and vitals reviewed. Constitutional: She is oriented to person, place, and time. She appears well-developed and well-nourished.  Non-toxic appearance.  HENT:  Head: Normocephalic.  Right Ear: Tympanic membrane and external ear normal.  Left Ear: Tympanic membrane and external ear normal.  Eyes: EOM and lids are normal. Pupils are equal, round, and  reactive to light.  Neck: Normal range of motion. Neck supple. Carotid bruit is not present.  Cardiovascular: Normal rate, regular rhythm, normal heart sounds, intact distal pulses and normal pulses.   Pulmonary/Chest: Breath sounds normal. No respiratory distress.  Abdominal: Soft. Bowel sounds are normal. There is tenderness.       Left upper and lower abd tenderness.    Genitourinary:       Left cvat. Chaperone present during pelvic exam. External vaginal area wnl. No wall motion tenderness. No mass of adnexa. Can not asses size of uterus due to body habitus.  Musculoskeletal: Normal range of motion.  Lymphadenopathy:       Head (right side): No submandibular adenopathy present.       Head (left side): No submandibular adenopathy present.    She has no cervical adenopathy.  Neurological: She is alert and oriented to person, place, and time. She has normal strength. No cranial nerve deficit or sensory deficit.  Skin: Skin is warm and dry.  Psychiatric: She has a normal mood and affect. Her speech is normal.    ED Course  Procedures  MDM I have reviewed nursing notes, vital signs, and all appropriate lab and imaging results for this patient.      Kathie Dike, [place] 08/02/11 2206

## 2011-08-05 LAB — GC/CHLAMYDIA PROBE AMP, GENITAL: Chlamydia, DNA Probe: NEGATIVE

## 2011-08-11 NOTE — ED Provider Notes (Signed)
Medical screening examination/treatment/procedure(s) were performed by non-physician practitioner and as supervising physician I was immediately available for consultation/collaboration.   Bannon Giammarco, MD 08/11/11 0912 

## 2011-10-07 LAB — COMPREHENSIVE METABOLIC PANEL
ALT: 62 — ABNORMAL HIGH
AST: 46 — ABNORMAL HIGH
AST: 74 — ABNORMAL HIGH
Albumin: 1.8 — ABNORMAL LOW
Albumin: 2 — ABNORMAL LOW
Alkaline Phosphatase: 76
Alkaline Phosphatase: 78
BUN: 3 — ABNORMAL LOW
BUN: 5 — ABNORMAL LOW
CO2: 25
Calcium: 8 — ABNORMAL LOW
Calcium: 8 — ABNORMAL LOW
Chloride: 107
Chloride: 110
Chloride: 112
Creatinine, Ser: 0.58
Creatinine, Ser: 0.62
GFR calc Af Amer: >60
GFR calc Af Amer: >60
GFR calc non Af Amer: >60
Glucose, Bld: 69 — ABNORMAL LOW
Glucose, Bld: 72
Potassium: 3.4 — ABNORMAL LOW
Potassium: 3.6
Sodium: 139
Total Bilirubin: 0.3
Total Bilirubin: 0.3
Total Bilirubin: 0.3

## 2011-10-07 LAB — CBC
HCT: 22.6 — ABNORMAL LOW
HCT: 25.2 — ABNORMAL LOW
Hemoglobin: 7.1 — CL
Hemoglobin: 6.9 — CL
MCHC: 32.5
MCV: 71.2 — ABNORMAL LOW
MCV: 71.6 — ABNORMAL LOW
Platelets: 387
RBC: 3.53 — ABNORMAL LOW
RBC: 2.83 — ABNORMAL LOW
RDW: 17.8 — ABNORMAL HIGH
WBC: 12.1 — ABNORMAL HIGH
WBC: 8.1
WBC: 5.3

## 2011-10-08 LAB — CBC
HCT: 29 — ABNORMAL LOW
Hemoglobin: 10.6 — ABNORMAL LOW
Hemoglobin: 9.3 — ABNORMAL LOW
MCHC: 32.1
MCHC: 32.5
MCV: 74.3 — ABNORMAL LOW
MCV: 75.7 — ABNORMAL LOW
MCV: 76.1 — ABNORMAL LOW
Platelets: 162
RBC: 3.82 — ABNORMAL LOW
RBC: 4.45
RDW: 16.5 — ABNORMAL HIGH
WBC: 7.9

## 2011-10-08 LAB — COMPREHENSIVE METABOLIC PANEL
AST: 161 — ABNORMAL HIGH
AST: 84 — ABNORMAL HIGH
Albumin: 2.6 — ABNORMAL LOW
Alkaline Phosphatase: 133 — ABNORMAL HIGH
CO2: 20
CO2: 25
Calcium: 8.1 — ABNORMAL LOW
Chloride: 109
Creatinine, Ser: 0.59
GFR calc Af Amer: >60
GFR calc Af Amer: >60
GFR calc non Af Amer: >60
Potassium: 3.8
Total Bilirubin: 0.5

## 2011-10-08 LAB — URINALYSIS, ROUTINE W REFLEX MICROSCOPIC
Hgb urine dipstick: NEGATIVE
Nitrite: NEGATIVE
Specific Gravity, Urine: 1.02
Urobilinogen, UA: 1
pH: 6.5

## 2011-10-08 LAB — RPR: RPR Ser Ql: NONREACTIVE

## 2011-11-01 ENCOUNTER — Other Ambulatory Visit: Payer: Self-pay | Admitting: Family Medicine

## 2011-11-01 DIAGNOSIS — R52 Pain, unspecified: Secondary | ICD-10-CM

## 2011-11-01 DIAGNOSIS — N920 Excessive and frequent menstruation with regular cycle: Secondary | ICD-10-CM

## 2011-11-05 ENCOUNTER — Ambulatory Visit
Admission: RE | Admit: 2011-11-05 | Discharge: 2011-11-05 | Disposition: A | Discharge status: Home / Self Care | Payer: Medicaid Other | Source: Ambulatory Visit | Attending: Family Medicine | Admitting: Family Medicine

## 2011-11-05 DIAGNOSIS — N920 Excessive and frequent menstruation with regular cycle: Secondary | ICD-10-CM

## 2011-11-05 DIAGNOSIS — R52 Pain, unspecified: Secondary | ICD-10-CM

## 2012-02-13 DIAGNOSIS — R102 Pelvic and perineal pain: Secondary | ICD-10-CM

## 2012-02-13 DIAGNOSIS — E669 Obesity, unspecified: Secondary | ICD-10-CM

## 2012-02-13 HISTORY — DX: Obesity, unspecified: E66.9

## 2012-02-13 HISTORY — DX: Pelvic and perineal pain: R10.2

## 2012-03-02 ENCOUNTER — Encounter (INDEPENDENT_AMBULATORY_CARE_PROVIDER_SITE_OTHER): Payer: Medicaid Other | Admitting: Obstetrics and Gynecology

## 2012-03-02 DIAGNOSIS — N926 Irregular menstruation, unspecified: Secondary | ICD-10-CM

## 2012-03-02 DIAGNOSIS — N949 Unspecified condition associated with female genital organs and menstrual cycle: Secondary | ICD-10-CM

## 2012-03-07 ENCOUNTER — Observation Stay (HOSPITAL_COMMUNITY)
Admission: AD | Admit: 2012-03-07 | Discharge: 2012-03-09 | Disposition: A | Discharge status: Home / Self Care | Payer: Medicaid Other | Source: Ambulatory Visit | Attending: Obstetrics and Gynecology | Admitting: Obstetrics and Gynecology

## 2012-03-07 ENCOUNTER — Inpatient Hospital Stay (HOSPITAL_COMMUNITY): Payer: Medicaid Other

## 2012-03-07 ENCOUNTER — Encounter (HOSPITAL_COMMUNITY): Payer: Self-pay | Admitting: Obstetrics and Gynecology

## 2012-03-07 DIAGNOSIS — R109 Unspecified abdominal pain: Secondary | ICD-10-CM | POA: Insufficient documentation

## 2012-03-07 DIAGNOSIS — E669 Obesity, unspecified: Secondary | ICD-10-CM

## 2012-03-07 DIAGNOSIS — N920 Excessive and frequent menstruation with regular cycle: Secondary | ICD-10-CM

## 2012-03-07 DIAGNOSIS — D649 Anemia, unspecified: Secondary | ICD-10-CM

## 2012-03-07 HISTORY — DX: Irregular menstruation, unspecified: N92.6

## 2012-03-07 HISTORY — DX: Benign neoplasm of connective and other soft tissue, unspecified: D21.9

## 2012-03-07 LAB — DIFFERENTIAL
Basophils Absolute: 0 10*3/uL (ref 0.0–0.1)
Basophils Relative: 0 % (ref 0–1)
Lymphocytes Relative: 35 % (ref 12–46)
Monocytes Absolute: 0.4 10*3/uL (ref 0.1–1.0)
Neutro Abs: 3.6 10*3/uL (ref 1.7–7.7)
Neutrophils Relative %: 57 % (ref 43–77)

## 2012-03-07 LAB — CBC
HCT: 31.5 % — ABNORMAL LOW (ref 36.0–46.0)
MCHC: 30.8 g/dL (ref 30.0–36.0)
RDW: 17.2 % — ABNORMAL HIGH (ref 11.5–15.5)
WBC: 6.3 10*3/uL (ref 4.0–10.5)

## 2012-03-07 MED ORDER — SODIUM CHLORIDE 0.9 % IV SOLN: 250.0000 mL | INTRAVENOUS | Status: DC | PRN

## 2012-03-07 MED ORDER — SODIUM CHLORIDE 0.9 % IJ SOLN
3.0000 mL | Freq: Two times a day (BID) | INTRAMUSCULAR | Status: DC
  Administered 2012-03-07 – 2012-03-08 (×2): 3 mL via INTRAVENOUS

## 2012-03-07 MED ORDER — ONDANSETRON HCL 4 MG/2ML IJ SOLN
4.0000 mg | Freq: Four times a day (QID) | INTRAMUSCULAR | Status: DC | PRN
  Administered 2012-03-08 – 2012-03-09 (×3): 4 mg via INTRAVENOUS
  Filled 2012-03-07 (×3): qty 2

## 2012-03-07 MED ORDER — ZOLPIDEM TARTRATE 10 MG PO TABS
10.0000 mg | ORAL_TABLET | Freq: Every evening | ORAL | Status: DC | PRN
  Administered 2012-03-07 – 2012-03-08 (×2): 10 mg via ORAL
  Filled 2012-03-07 (×2): qty 1

## 2012-03-07 MED ORDER — IBUPROFEN 600 MG PO TABS
600.0000 mg | ORAL_TABLET | Freq: Four times a day (QID) | ORAL | Status: DC | PRN
  Administered 2012-03-08: 600 mg via ORAL
  Filled 2012-03-07: qty 1

## 2012-03-07 MED ORDER — OXYCODONE-ACETAMINOPHEN 5-325 MG PO TABS
1.0000 | ORAL_TABLET | Freq: Four times a day (QID) | ORAL | Status: DC | PRN
  Administered 2012-03-07: 2 via ORAL
  Administered 2012-03-08: 1 via ORAL
  Administered 2012-03-08 – 2012-03-09 (×2): 2 via ORAL
  Filled 2012-03-07 (×2): qty 2
  Filled 2012-03-07: qty 1
  Filled 2012-03-07: qty 2

## 2012-03-07 MED ORDER — TRAMADOL HCL 50 MG PO TABS
50.0000 mg | ORAL_TABLET | Freq: Four times a day (QID) | ORAL | Status: DC | PRN
  Filled 2012-03-07: qty 2

## 2012-03-07 MED ORDER — SODIUM BICARBONATE 8.4 % IV SOLN
INTRAVENOUS | Status: AC
  Filled 2012-03-07: qty 50

## 2012-03-07 MED ORDER — LIDOCAINE HCL (PF) 1 % IJ SOLN
INTRAMUSCULAR | Status: AC
  Filled 2012-03-07: qty 5

## 2012-03-07 MED ORDER — HYDROCODONE-ACETAMINOPHEN 5-325 MG PO TABS: 1.0000 | ORAL_TABLET | Freq: Four times a day (QID) | ORAL | Status: DC | PRN

## 2012-03-07 MED ORDER — ALBUTEROL SULFATE HFA 108 (90 BASE) MCG/ACT IN AERS
2.0000 | INHALATION_SPRAY | Freq: Four times a day (QID) | RESPIRATORY_TRACT | Status: DC | PRN
  Administered 2012-03-07: 2 via RESPIRATORY_TRACT
  Filled 2012-03-07: qty 6.7

## 2012-03-07 MED ORDER — SODIUM CHLORIDE 0.9 % IJ SOLN
3.0000 mL | INTRAMUSCULAR | Status: DC | PRN
  Administered 2012-03-08: 3 mL via INTRAVENOUS

## 2012-03-07 MED ORDER — SIMETHICONE 80 MG PO CHEW: 80.0000 mg | CHEWABLE_TABLET | Freq: Four times a day (QID) | ORAL | Status: DC | PRN

## 2012-03-07 MED ORDER — ESTROGENS CONJUGATED 25 MG IJ SOLR
25.0000 mg | Freq: Four times a day (QID) | INTRAMUSCULAR | Status: DC
  Administered 2012-03-07 – 2012-03-08 (×4): 25 mg via INTRAVENOUS
  Filled 2012-03-07 (×4): qty 25

## 2012-03-07 MED ORDER — ALBUTEROL 90 MCG/ACT IN AERS: 2.0000 | INHALATION_SPRAY | Freq: Four times a day (QID) | RESPIRATORY_TRACT | Status: DC | PRN

## 2012-03-07 MED ORDER — TRAMADOL HCL 50 MG PO TABS
50.0000 mg | ORAL_TABLET | Freq: Four times a day (QID) | ORAL | Status: DC | PRN
  Administered 2012-03-07: 100 mg via ORAL

## 2012-03-07 MED ORDER — ONDANSETRON HCL 4 MG PO TABS: 4.0000 mg | ORAL_TABLET | Freq: Four times a day (QID) | ORAL | Status: DC | PRN

## 2012-03-07 NOTE — MAU Provider Note (Signed)
History     CSN: [id number]  Arrival date and time: 03/07/12 1246   First Provider Initiated Contact with Patient 03/07/12 1344      Chief Complaint  Patient presents with  . Fibroids  . Abdominal Pain   HPI Comments: Pt is a 40yo G1P1 that presents with DUB. Pt seen in office on Monday and started on provera 20mg  then on Thursday switched to OCP and has had no improvement in bleeding, reports passing clots and saturating a pad or 2 per hour. C/O dizziness with standing. Pt also c/o severe abdominal cramping with no relief from hydrocodone.    Abdominal Pain Associated symptoms include constipation.      Past Medical History  Diagnosis Date  . Fibroids   . Irregular bleeding     Past Surgical History  Procedure Date  . Fibroidecto   . Fibroidectomy   . Tubal ligation   . Cesarean section     History reviewed. No pertinent family history.  History  Substance Use Topics  . Smoking status: Never Smoker   . Smokeless tobacco: Not on file  . Alcohol Use: No    Allergies: No Known Allergies  Prescriptions prior to admission  Medication Sig Dispense Refill  . albuterol (PROVENTIL,VENTOLIN) 90 MCG/ACT inhaler Inhale 2 puffs into the lungs every 6 (six) hours as needed. Shortness of breath and wheezing       . HYDROcodone-acetaminophen (NORCO) 5-325 MG per tablet Take 1 tablet by mouth every 6 (six) hours as needed.      Marland Kitchen ibuprofen (ADVIL,MOTRIN) 200 MG tablet Take 400 mg by mouth every 6 (six) hours as needed. Pain        . Multiple Vitamin (MULTIVITAMIN) tablet Take 1 tablet by mouth daily.        . norethindrone-ethinyl estradiol (BALZIVA) 0.4-35 MG-MCG tablet Take 1 tablet by mouth daily. Take as directed      . zolpidem (AMBIEN) 5 MG tablet Take 5 mg by mouth at bedtime as needed. For sleep        Review of Systems  Gastrointestinal: Positive for abdominal pain and constipation.  Neurological: Positive for dizziness and weakness.  All other systems reviewed  and are negative.   Physical Exam   Blood pressure 140/75, pulse 97, temperature 98.1 F (36.7 C), resp. rate 18, last menstrual period 01/23/2012.  Physical Exam  Nursing note and vitals reviewed. Constitutional: She is oriented to person, place, and time. She appears well-developed and well-nourished. No distress.  HENT:  Head: Normocephalic.  Neck: Normal range of motion.  Cardiovascular: Normal rate, regular rhythm and normal heart sounds.   Respiratory: Effort normal and breath sounds normal.  GI: Soft. Bowel sounds are normal. She exhibits no distension. There is tenderness in the right lower quadrant and left lower quadrant.  Genitourinary: Vagina normal. Cervix exhibits no motion tenderness.       Bimanual - cx firm, unable to palpate uterus secondary to body habitus, slightly tender with exam   Musculoskeletal: Normal range of motion. She exhibits no edema and no tenderness.  Neurological: She is alert and oriented to person, place, and time.  Skin: Skin is warm and dry.  Psychiatric: She has a normal mood and affect. Her behavior is normal.    MAU Course  Procedures    Assessment and Plan  DUB - non-responsive to provera and OCP  Will draw CBC Check orthostatics - stable D/W DR. Dillard - she will come see pt  Ellen Hill 03/07/2012,  1:49 PM

## 2012-03-07 NOTE — MAU Note (Signed)
Vaginal bleeding since 1/30 medication to stop the bleeding is not helping (Provera) and pain medication does not help.

## 2012-03-07 NOTE — H&P (Signed)
Ellen Hill is an 41 y.o. female. whp presents with heavy vaginal bleeding.  She is soaking more than one pad in an hour.  No chest pain or sob.  She has been bleeding off and on for the last two months. She has tried provera 20 mg qd and and ocp taper without relief.  She is very frustrated and desires a hysterectomy  Pertinent Gynecological History: Menses: flow is excessive with use of 24 pads or tampons on heaviest days Bleeding: dysfunctional uterine bleeding Contraception: BTL DES exposure: unknown Blood transfusions: none Sexually transmitted diseases: na Previous GYN Procedures: myomectomy and classical cesarean  Last mammogram: na Date:  Last pap: normal Date: 2013 OB History: G1, P1   Menstrual History: Menarche age: unknown Patient's last menstrual period was 01/23/2012.    Past Medical History  Diagnosis Date  . Fibroids   . Irregular bleeding     Past Surgical History  Procedure Date  . Fibroidecto   . Fibroidectomy   . Tubal ligation   . Cesarean section     History reviewed. No pertinent family history.  Social History:  reports that she has never smoked. She does not have any smokeless tobacco history on file. She reports that she does not drink alcohol or use illicit drugs.  Allergies: No Known Allergies  Prescriptions prior to admission  Medication Sig Dispense Refill  . albuterol (PROVENTIL,VENTOLIN) 90 MCG/ACT inhaler Inhale 2 puffs into the lungs every 6 (six) hours as needed. Shortness of breath and wheezing       . HYDROcodone-acetaminophen (NORCO) 5-325 MG per tablet Take 1 tablet by mouth every 6 (six) hours as needed.      Marland Kitchen ibuprofen (ADVIL,MOTRIN) 200 MG tablet Take 400 mg by mouth every 6 (six) hours as needed. Pain        . Multiple Vitamin (MULTIVITAMIN) tablet Take 1 tablet by mouth daily.        . norethindrone-ethinyl estradiol (BALZIVA) 0.4-35 MG-MCG tablet Take 1 tablet by mouth daily. Take as directed      . zolpidem (AMBIEN) 5 MG  tablet Take 5 mg by mouth at bedtime as needed. For sleep         ROS Review of Systems - History obtained from the patient General ROS: negative Hematological and Lymphatic ROS: negative Respiratory ROS: no cough, shortness of breath, or wheezing Cardiovascular ROS: no chest pain or dyspnea on exertion Genito-Urinary ROS: no dysuria, trouble voiding, or hematuria  Blood pressure 140/75, pulse 97, temperature 98.1 F (36.7 C), resp. rate 18, last menstrual period 01/23/2012. Physical Exam Physical Examination: General appearance - alert, well appearing, and in no distress Neck - supple, no significant adenopathy Chest - clear to auscultation, no wheezes, rales or rhonchi, symmetric air entry Heart - normal rate, regular rhythm, normal S1, S2, no murmurs, rubs, clicks or gallops Abdomen - soft, nontender, nondistended, no masses or organomegaly Breasts - breasts appear normal, no suspicious masses, no skin or nipple changes or axillary nodes Pelvic - normal external genitalia, vulva, vagina, cervix, uterus and adnexa, pt has heavy vb Neurological - alert, oriented, normal speech, no focal findings or movement disorder noted Musculoskeletal - no joint tenderness, deformity or swelling Extremities - peripheral pulses normal, no pedal edema, no clubbing or cyanosis Skin - normal coloration and turgor, no rashes, no suspicious skin lesions noted   Results for orders placed during the hospital encounter of 03/07/12 (from the past 24 hour(s))  CBC     Status: Abnormal  Collection Time   03/07/12  1:24 PM      Component Value Range   WBC 6.3  4.0 - 10.5 (K/uL)   RBC 4.35  3.87 - 5.11 (MIL/uL)   Hemoglobin 9.7 (*) 12.0 - 15.0 (g/dL)   HCT 16.1 (*) 09.6 - 46.0 (%)   MCV 72.4 (*) 78.0 - 100.0 (fL)   MCH 22.3 (*) 26.0 - 34.0 (pg)   MCHC 30.8  30.0 - 36.0 (g/dL)   RDW 04.5 (*) 40.9 - 15.5 (%)   Platelets 261  150 - 400 (K/uL)  DIFFERENTIAL     Status: Normal   Collection Time   03/07/12   1:24 PM      Component Value Range   Neutrophils Relative 57  43 - 77 (%)   Neutro Abs 3.6  1.7 - 7.7 (K/uL)   Lymphocytes Relative 35  12 - 46 (%)   Lymphs Abs 2.2  0.7 - 4.0 (K/uL)   Monocytes Relative 6  3 - 12 (%)   Monocytes Absolute 0.4  0.1 - 1.0 (K/uL)   Eosinophils Relative 2  0 - 5 (%)   Eosinophils Absolute 0.1  0.0 - 0.7 (K/uL)   Basophils Relative 0  0 - 1 (%)   Basophils Absolute 0.0  0.0 - 0.1 (K/uL)    No results found.  Assessment/Plan: mennorhagia Anemia Pt failed out patient treatment She is not a candidate for endometrial ablation because of history of classical cesarean and myomectomy Pt considering [place] and hysterectomy.  Because of BMI and past surgeries will [facility] to [facility] or [facility] if she chooses hysterectomy Will give iv premarin and try ocps. If pt is refractory will do D&C Pt understands risks of DVT or VTE with iv premarin  Nadea Kirkland A 03/07/2012, 2:35 PM

## 2012-03-08 DIAGNOSIS — D649 Anemia, unspecified: Secondary | ICD-10-CM

## 2012-03-08 DIAGNOSIS — N920 Excessive and frequent menstruation with regular cycle: Secondary | ICD-10-CM

## 2012-03-08 DIAGNOSIS — E669 Obesity, unspecified: Secondary | ICD-10-CM

## 2012-03-08 LAB — CBC
MCH: 22.1 pg — ABNORMAL LOW (ref 26.0–34.0)
Platelets: 242 10*3/uL (ref 150–400)
RBC: 3.76 MIL/uL — ABNORMAL LOW (ref 3.87–5.11)
RDW: 17.3 % — ABNORMAL HIGH (ref 11.5–15.5)

## 2012-03-08 MED ORDER — ESTROGENS CONJUGATED 25 MG IJ SOLR
25.0000 mg | Freq: Four times a day (QID) | INTRAMUSCULAR | Status: AC
  Administered 2012-03-08: 25 mg via INTRAVENOUS
  Filled 2012-03-08: qty 25

## 2012-03-08 MED ORDER — NORETHINDRONE-ETH ESTRADIOL 0.4-35 MG-MCG PO TABS
1.0000 | ORAL_TABLET | Freq: Four times a day (QID) | ORAL | Status: DC
  Administered 2012-03-08 – 2012-03-09 (×3): 1 via ORAL
  Filled 2012-03-08 (×3): qty 1

## 2012-03-08 MED ORDER — FERROUS SULFATE 325 (65 FE) MG PO TABS
325.0000 mg | ORAL_TABLET | Freq: Two times a day (BID) | ORAL | Status: DC
  Administered 2012-03-08 – 2012-03-09 (×2): 325 mg via ORAL
  Filled 2012-03-08 (×2): qty 1

## 2012-03-08 MED ORDER — DOCUSATE SODIUM 100 MG PO CAPS
100.0000 mg | ORAL_CAPSULE | Freq: Two times a day (BID) | ORAL | Status: DC
  Administered 2012-03-08 – 2012-03-09 (×3): 100 mg via ORAL
  Filled 2012-03-08 (×3): qty 1

## 2012-03-08 NOTE — Progress Notes (Signed)
Subjective: Patient reports nausea, tolerating PO and no problems voiding.   Bleeding has slowed down.  Pt with hot flashes and cramping only made better with percocet  Objective: I have reviewed patient's vital signs, intake and output, labs and radiology results.  General: alert and cooperative Resp: clear to auscultation bilaterally Cardio: regular rate and rhythm, S1, S2 normal, no murmur, click, rub or gallop GI: soft, non-tender; bowel sounds normal; no masses,  no organomegaly Extremities: extremities normal, atraumatic, no cyanosis or edema and Homans sign is negative, no sign of DVT Vaginal Bleeding: minimal  Ultrasound uterus normal size with thickened endometrium CBC    Component Value Date/Time   WBC 7.9 03/08/2012 0531   RBC 3.76* 03/08/2012 0531   HGB 8.3* 03/08/2012 0531   HCT 27.4* 03/08/2012 0531   PLT 242 03/08/2012 0531   MCV 72.9* 03/08/2012 0531   MCH 22.1* 03/08/2012 0531   MCHC 30.3 03/08/2012 0531   RDW 17.3* 03/08/2012 0531   LYMPHSABS 2.2 03/07/2012 1324   MONOABS 0.4 03/07/2012 1324   EOSABS 0.1 03/07/2012 1324   BASOSABS 0.0 03/07/2012 1324    Assessment/Plan: Menorrhagia with anemia Give one more dose of iv premarin then start pill taper Return to my office on 3/20 as scheduled for EMBx and SHG Pt considering [place] will plan for that as well Continue iron  LOS: 1 day    Ha Placeres A 03/08/2012, 12:26 PM

## 2012-03-09 DIAGNOSIS — D649 Anemia, unspecified: Secondary | ICD-10-CM

## 2012-03-09 DIAGNOSIS — N920 Excessive and frequent menstruation with regular cycle: Secondary | ICD-10-CM

## 2012-03-09 DIAGNOSIS — E669 Obesity, unspecified: Secondary | ICD-10-CM

## 2012-03-09 MED ORDER — DSS 100 MG PO CAPS: 100.0000 mg | ORAL_CAPSULE | Freq: Two times a day (BID) | ORAL | Status: AC

## 2012-03-09 MED ORDER — NORETHINDRONE-ETH ESTRADIOL 0.4-35 MG-MCG PO TABS: 1.0000 | ORAL_TABLET | Freq: Four times a day (QID) | ORAL | Status: DC

## 2012-03-09 MED ORDER — IBUPROFEN 600 MG PO TABS: 600.0000 mg | ORAL_TABLET | Freq: Four times a day (QID) | ORAL | Status: DC | PRN

## 2012-03-09 MED ORDER — FERROUS SULFATE 325 (65 FE) MG PO TABS: 325.0000 mg | ORAL_TABLET | Freq: Two times a day (BID) | ORAL | Status: DC

## 2012-03-09 MED ORDER — ONDANSETRON HCL 4 MG PO TABS: 4.0000 mg | ORAL_TABLET | Freq: Four times a day (QID) | ORAL | Status: AC | PRN

## 2012-03-09 NOTE — Discharge Instructions (Signed)
Call [facility] @ [phone] if:  You have a temperature greater than or equal to 100.4 degrees Farenheit orally You have pain that is not made better by the pain medication given and taken as directed You have excessive bleeding (soaking a pad each hour) You experience chest pain, shortness of breath, leg pain or swelling or headache that causes you to lose your vision  Take Colace (Docusate Sodium/Stool Softener) 100 mg 2-3 times daily while taking narcotic  pain medicine and iron supplements to avoid constipation or until bowel movements are regular.  Remember to eat a light meal and to take pain medications prior to your office visit on Wednesday In preparation for an endometrial biopsy as explained to you.

## 2012-03-09 NOTE — Progress Notes (Signed)
Post discharge review completed. 

## 2012-03-09 NOTE — Progress Notes (Signed)
Ellen Hill is a40 y.o.  [id number]  Subjective: Patient is Doing well.  Only reports mile abdominal achiness-no cramping, and resolution of nausea. Patient denies leg pain/swelling, headache, chest pain or shortness of breath. Bowel  & bladder function regular, tolerating regular diet and ambulating without assistance.   Objective: Vital signs in last 24 hours: Temp:  [97.4 F (36.3 C)-98.6 F (37 C)] 97.4 F (36.3 C) (03/18 0538) Pulse Rate:  [69-80] 80  (03/18 0538) Resp:  [18] 18  (03/18 0538) BP: (118-129)/(71-78) 120/75 mmHg (03/18 0538) SpO2:  [100 %] 100 % (03/18 0538)  Intake/Output from previous day: 03/17 0701 - 03/18 0700 In: 10.2 [I.V.:10.2] Out: -  Intake/Output this shift:    Lab 03/08/12 0531 03/07/12 1324  WBC 7.9 6.3  HGB 8.3* 9.7*  HCT 27.4* 31.5*  PLT 242 261    No results found for this basename: NA:3,K:3,CL:3,CO2:3,BUN:3,CREATININE:3,CALCIUM:3,LABALBU:3,PROT:3,BILITOT:3,ALKPHOS:3,ALT:3,AST:3,GLUCOSE:3 in the last 168 hours  EXAM: Resp: clear to auscultation bilaterally Cardio: regular rate and rhythm GI: soft, non-tender; bowel sounds normal; no masses,  no organomegaly Extremities: Homans sign is negative, no sign of DVT Vaginal Bleeding: none on perineal pad SCD hose in place and functioning  Assessment: s/p : stable, progressing well and anemia  Plan: Discharge home [facility] taper  LOS: 2 days    Pharoah Goggins,  PA-C 03/09/2012 7:56 AM

## 2012-03-09 NOTE — Discharge Summary (Signed)
  Physician Discharge Summary  Patient ID: NAMI STRAWDER MRN: [id number] DOB/AGE: April 14, 1971 41 y.o.  Admit date: 03/07/2012 Discharge date: 03/09/2012  Admission Diagnoses: abd pn heavy bleeding fibroids  Discharge Diagnoses: abd pn heavy bleeding fibroids        Active Problems:  Menorrhagia Morbid Obesity  Discharged Condition: Stable  Hospital Course: On date of admission patient was placed on IV Premarin for management of menorrhagia and anemia.  Patient's bleeding abated with this treatment and was started on a 35 mcg oral contraceptive taper.  By hospital day #2 patient had received the maximum benefit of her hospitial stay and was deemed ready for discharge home with outpatient follow-up with Dr. Normand Sloop in two days.   Disposition: Home  Discharge Orders    Future Appointments: Provider: Department: Dept Phone: Center:   03/11/2012 2:00 PM [facility] [phone] None   03/11/2012 2:15 PM Naima A Dillard, MD [facility] [phone] None     Medication List  As of 03/09/2012  8:17 AM   TAKE these medications         albuterol 90 MCG/ACT inhaler   Commonly known as: PROVENTIL,VENTOLIN   Inhale 2 puffs into the lungs every 6 (six) hours as needed. Shortness of breath and wheezing      DSS 100 MG Caps   Take 100 mg by mouth 2 (two) times daily.      ferrous sulfate 325 (65 FE) MG tablet   Take 1 tablet (325 mg total) by mouth 2 (two) times daily with a meal.      HYDROcodone-acetaminophen 5-325 MG per tablet   Commonly known as: NORCO   Take 1 tablet by mouth every 6 (six) hours as needed.      multivitamin tablet   Take 1 tablet by mouth daily.      norethindrone-ethinyl estradiol 0.4-35 MG-MCG tablet   Commonly known as: OVCON-35,BALZIVA,BRIELLYN   Take 1 tablet by mouth 4 (four) times daily. X 2 days then tid x 2 days then bid  X 2 days then qd      ondansetron 4 MG tablet   Commonly known as: ZOFRAN   Take 1 tablet (4 mg total) by mouth every 6  (six) hours as needed for nausea.      zolpidem 5 MG tablet   Commonly known as: AMBIEN   Take 5 mg by mouth at bedtime as needed. For sleep         ASK your doctor about these medications         ibuprofen 200 MG tablet   Commonly known as: ADVIL,MOTRIN   Take 400 mg by mouth every 6 (six) hours as needed. Pain             Follow-up Information    Follow up with [facility], MD in 2 days.   Contact information:   [address] [place] [address] [phone]          Signed: Patrick Jupiter 03/09/2012, 8:17 AM

## 2012-03-10 ENCOUNTER — Other Ambulatory Visit: Payer: Self-pay | Admitting: Obstetrics and Gynecology

## 2012-03-10 DIAGNOSIS — D219 Benign neoplasm of connective and other soft tissue, unspecified: Secondary | ICD-10-CM

## 2012-03-11 ENCOUNTER — Encounter (INDEPENDENT_AMBULATORY_CARE_PROVIDER_SITE_OTHER): Payer: Medicaid Other

## 2012-03-11 ENCOUNTER — Encounter (INDEPENDENT_AMBULATORY_CARE_PROVIDER_SITE_OTHER): Payer: Medicaid Other | Admitting: Obstetrics and Gynecology

## 2012-03-11 DIAGNOSIS — N926 Irregular menstruation, unspecified: Secondary | ICD-10-CM

## 2012-03-11 DIAGNOSIS — D250 Submucous leiomyoma of uterus: Secondary | ICD-10-CM

## 2012-03-11 DIAGNOSIS — E669 Obesity, unspecified: Secondary | ICD-10-CM

## 2012-03-17 ENCOUNTER — Ambulatory Visit
Admission: RE | Admit: 2012-03-17 | Discharge: 2012-03-17 | Disposition: A | Discharge status: Home / Self Care | Payer: Medicaid Other | Source: Ambulatory Visit | Attending: Obstetrics and Gynecology | Admitting: Obstetrics and Gynecology

## 2012-03-17 DIAGNOSIS — D219 Benign neoplasm of connective and other soft tissue, unspecified: Secondary | ICD-10-CM

## 2012-03-17 HISTORY — DX: Anemia, unspecified: D64.9

## 2012-03-17 NOTE — Progress Notes (Signed)
LMP:  01/23/2012 w/ continued flow through hospitalization of 03/07/2012-03/09/2012.  Pt wasplaced on IV Premarin for management of menorrhagia and anemia.  Bleeding abated w/ Rx, & pt was then started on a 35 mcg oral contraceptive taper.    Pt describes heavy flow w/ clots, cramping pain, abd swelling & bloating.  States that she has been taking Oxycodone as needed for pain.   Hgb:  8.3 on 03/08/2012.  Pt taking Rx Fe 325 mg bid.

## 2012-03-18 ENCOUNTER — Other Ambulatory Visit: Payer: Self-pay | Admitting: Obstetrics and Gynecology

## 2012-03-18 DIAGNOSIS — D219 Benign neoplasm of connective and other soft tissue, unspecified: Secondary | ICD-10-CM

## 2012-03-19 ENCOUNTER — Ambulatory Visit
Admission: RE | Admit: 2012-03-19 | Discharge: 2012-03-19 | Disposition: A | Discharge status: Home / Self Care | Payer: Medicaid Other | Source: Ambulatory Visit | Attending: Obstetrics and Gynecology | Admitting: Obstetrics and Gynecology

## 2012-03-19 DIAGNOSIS — D219 Benign neoplasm of connective and other soft tissue, unspecified: Secondary | ICD-10-CM

## 2012-03-19 MED ORDER — GADOBENATE DIMEGLUMINE 529 MG/ML IV SOLN
20.0000 mL | Freq: Once | INTRAVENOUS | Status: AC | PRN
  Administered 2012-03-19: 20 mL via INTRAVENOUS

## 2012-03-23 ENCOUNTER — Other Ambulatory Visit: Payer: Self-pay | Admitting: Interventional Radiology

## 2012-03-23 DIAGNOSIS — D219 Benign neoplasm of connective and other soft tissue, unspecified: Secondary | ICD-10-CM

## 2012-04-01 ENCOUNTER — Other Ambulatory Visit: Payer: Self-pay | Admitting: Obstetrics and Gynecology

## 2012-04-01 ENCOUNTER — Ambulatory Visit
Admission: RE | Admit: 2012-04-01 | Discharge: 2012-04-01 | Disposition: A | Discharge status: Home / Self Care | Payer: Medicaid Other | Source: Ambulatory Visit | Attending: Interventional Radiology | Admitting: Interventional Radiology

## 2012-04-01 DIAGNOSIS — D219 Benign neoplasm of connective and other soft tissue, unspecified: Secondary | ICD-10-CM

## 2012-04-06 ENCOUNTER — Other Ambulatory Visit: Payer: Self-pay | Admitting: Obstetrics and Gynecology

## 2012-04-06 NOTE — Telephone Encounter (Signed)
Routed to triage/needs to go back to [facility] shelf

## 2012-04-07 ENCOUNTER — Encounter (HOSPITAL_COMMUNITY): Payer: Self-pay | Admitting: *Deleted

## 2012-04-07 ENCOUNTER — Other Ambulatory Visit: Payer: Self-pay

## 2012-04-07 MED ORDER — ZOLPIDEM TARTRATE 5 MG PO TABS: ORAL_TABLET | ORAL | Status: DC

## 2012-04-08 NOTE — Telephone Encounter (Signed)
Spoke with pt rgd msg pt wants rf on percocet advised pt need eval first pt has appt on 04/13/12 at 9:15 with AVS pt voice understanding.

## 2012-04-10 ENCOUNTER — Encounter (HOSPITAL_COMMUNITY): Payer: Self-pay

## 2012-04-13 ENCOUNTER — Encounter: Payer: Self-pay | Admitting: Obstetrics and Gynecology

## 2012-04-13 ENCOUNTER — Ambulatory Visit (INDEPENDENT_AMBULATORY_CARE_PROVIDER_SITE_OTHER): Payer: Medicaid Other | Admitting: Obstetrics and Gynecology

## 2012-04-13 VITALS — BP 138/82 | Temp 97.5°F | Resp 16 | Ht 66.0 in | Wt 363.0 lb

## 2012-04-13 DIAGNOSIS — D649 Anemia, unspecified: Secondary | ICD-10-CM

## 2012-04-13 DIAGNOSIS — R109 Unspecified abdominal pain: Secondary | ICD-10-CM

## 2012-04-13 DIAGNOSIS — N946 Dysmenorrhea, unspecified: Secondary | ICD-10-CM

## 2012-04-13 DIAGNOSIS — E669 Obesity, unspecified: Secondary | ICD-10-CM

## 2012-04-13 DIAGNOSIS — D259 Leiomyoma of uterus, unspecified: Secondary | ICD-10-CM

## 2012-04-13 DIAGNOSIS — D219 Benign neoplasm of connective and other soft tissue, unspecified: Secondary | ICD-10-CM

## 2012-04-13 LAB — POCT URINALYSIS DIPSTICK
Glucose, UA: NEGATIVE
Leukocytes, UA: NEGATIVE
Nitrite, UA: NEGATIVE
Protein, UA: NEGATIVE
Spec Grav, UA: 1.015
Urobilinogen, UA: NEGATIVE

## 2012-04-13 MED ORDER — IBUPROFEN 200 MG PO TABS: 200.0000 mg | ORAL_TABLET | Freq: Four times a day (QID) | ORAL | Status: AC | PRN

## 2012-04-13 MED ORDER — OXYCODONE-ACETAMINOPHEN 5-325 MG PO TABS: 1.0000 | ORAL_TABLET | ORAL | Status: AC | PRN

## 2012-04-13 NOTE — Progress Notes (Addendum)
Vag. Discharge:no Odor:no Fever:no Irreg.Periods:no Dyspareunia:no Dysuria:no Frequency:no Urgency:no Hematuria:no Kidney stones:no Constipation:no Diarrhea:no Rectal Bleeding: no Vomiting:yes Nausea:yes Pregnant:no Fibroids:yes Endometriosis:yes Hx of Ovarian Cyst:no Hx IUD:no Hx STD-PID:yes Appendectomy:no Gall Bladder Dz:no  S: Pt with a known hx of fibroids, menorrhagia, anemia, and obesity. Was treated at the [facility] recently. [place] consult was done in March 2013. MRI is next. Patient needs more pain meds.  O: Abd is nontender; Back: no CVA tenderness  A: fibroids, anemia, menorrhagia, dysmenorrhia Pelvic: declined  P: Percocet  5/325; Motrin 600 mg q 6 hours prn, RTO 1 week.  AVS

## 2012-04-21 ENCOUNTER — Ambulatory Visit (INDEPENDENT_AMBULATORY_CARE_PROVIDER_SITE_OTHER): Payer: Medicaid Other | Admitting: Obstetrics and Gynecology

## 2012-04-21 ENCOUNTER — Encounter: Payer: Self-pay | Admitting: Obstetrics and Gynecology

## 2012-04-21 VITALS — BP 134/90 | Temp 98.6°F | Wt 360.0 lb

## 2012-04-21 DIAGNOSIS — N920 Excessive and frequent menstruation with regular cycle: Secondary | ICD-10-CM

## 2012-04-21 DIAGNOSIS — D259 Leiomyoma of uterus, unspecified: Secondary | ICD-10-CM

## 2012-04-21 NOTE — Progress Notes (Signed)
Ellen Hill is an 41 y.o. female. whp presents with history of  heavy vaginal bleeding. She was admitted to the hospital for soaking more than one pad in an hour. No chest pain or sob. She has been bleeding off and on for the last two months.  Pt is not a candidate for endometrial ablation secondary to history of a myomectomy.  She is also not a candidate for [place] per Dr Fredia Sorrow.  Pt was found to have a polyp on [place].  Pt [facility] offered [facility].  Pt desires to try polypectomy first with ocps. If they dont work , she desires to be referred for hysterectomy  Pertinent Gynecological History:  Menses: flow is excessive with use of 24 pads or tampons on heaviest days  Bleeding: dysfunctional uterine bleeding  Contraception: BTL  DES exposure: unknown  Blood transfusions: none  Sexually transmitted diseases: na  Previous GYN Procedures: myomectomy and classical cesarean  Last mammogram: na Date:  Last pap: normal Date: 2013  OB History: G1, P1  Menstrual History:  Menarche age: unknown  Patient's last menstrual period was 01/23/2012.  Past Medical History   Diagnosis  Date   .  Fibroids    .  Irregular bleeding     Past Surgical History   Procedure  Date   .  Fibroidecto    .  myomectomy   .  Tubal ligation    .  Cesarean section    History reviewed. No pertinent family history.  Social History: reports that she has never smoked. She does not have any smokeless tobacco history on file. She reports that she does not drink alcohol or use illicit drugs.  Allergies: No Known Allergies  Prescriptions prior to admission   Medication  Sig  Dispense  Refill   .  albuterol (PROVENTIL,VENTOLIN) 90 MCG/ACT inhaler  Inhale 2 puffs into the lungs every 6 (six) hours as needed. Shortness of breath and wheezing     .  HYDROcodone-acetaminophen (NORCO) 5-325 MG per tablet  Take 1 tablet by mouth every 6 (six) hours as needed.     Marland Kitchen  ibuprofen (ADVIL,MOTRIN) 200 MG  tablet  Take 400 mg by mouth every 6 (six) hours as needed. Pain     .  Multiple Vitamin (MULTIVITAMIN) tablet  Take 1 tablet by mouth daily.     .  norethindrone-ethinyl estradiol (BALZIVA) 0.4-35 MG-MCG tablet  Take 1 tablet by mouth daily. Take as directed     .  zolpidem (AMBIEN) 5 MG tablet  Take 5 mg by mouth at bedtime as needed. For sleep     ROS  Review of Systems - History obtained from the patient  General ROS: negative  Hematological and Lymphatic ROS: negative  Respiratory ROS: no cough, shortness of breath, or wheezing  Cardiovascular ROS: no chest pain or dyspnea on exertion  Genito-Urinary ROS: no dysuria, trouble voiding, or hematuria  Blood pressure 140/75, pulse 97, temperature 98.1 F (36.7 C), resp. rate 18, last menstrual period 01/23/2012.  Physical Exam  Physical Examination: General appearance - alert, well appearing, and in no distress  Neck - supple, no significant adenopathy  Chest - clear to auscultation, no wheezes, rales or rhonchi, symmetric air entry  Heart - normal rate, regular rhythm, normal S1, S2, no murmurs, rubs, clicks or gallops  Abdomen - soft, nontender, nondistended, no masses or organomegaly , obese Breasts - breasts appear normal, no suspicious masses, no skin or  nipple changes or axillary nodes  Pelvic - normal external genitalia, vulva, vagina, cervix, uterus and adnexa, pt has heavy vb  Neurological - alert, oriented, normal speech, no focal findings or movement disorder noted  Musculoskeletal - no joint tenderness, deformity or swelling  Extremities - peripheral pulses normal, no pedal edema, no clubbing or cyanosis  Skin - normal coloration and turgor, no rashes, no suspicious skin lesions noted  Results for orders placed during the hospital encounter of 03/07/12 (from the past 24 hour(s))   CBC Status: Abnormal    Collection Time    03/07/12 1:24 PM   Component  Value  Range    WBC  6.3  4.0 - 10.5 (K/uL)    RBC  4.35  3.87 - 5.11  (MIL/uL)    Hemoglobin  9.7 (*)  12.0 - 15.0 (g/dL)    HCT  96.0 (*)  45.4 - 46.0 (%)    MCV  72.4 (*)  78.0 - 100.0 (fL)    MCH  22.3 (*)  26.0 - 34.0 (pg)    MCHC  30.8  30.0 - 36.0 (g/dL)    RDW  09.8 (*)  11.9 - 15.5 (%)    Platelets  261  150 - 400 (K/uL)   DIFFERENTIAL Status: Normal    Collection Time    03/07/12 1:24 PM   Component  Value  Range    Neutrophils Relative  57  43 - 77 (%)    Neutro Abs  3.6  1.7 - 7.7 (K/uL)    Lymphocytes Relative  35  12 - 46 (%)    Lymphs Abs  2.2  0.7 - 4.0 (K/uL)    Monocytes Relative  6  3 - 12 (%)    Monocytes Absolute  0.4  0.1 - 1.0 (K/uL)    Eosinophils Relative  2  0 - 5 (%)    Eosinophils Absolute  0.1  0.0 - 0.7 (K/uL)    Basophils Relative  0  0 - 1 (%)    Basophils Absolute  0.0  0.0 - 0.1 (K/uL)   No results found.  Assessment/Plan:  mennorhagia  Anemia  Pt failed out patient treatment  She desires D&C hysteroscopy polypectomy. She understands the risks are but not limited to bleeding, infection, perforation of the uterus.   Velna Hedgecock A  03/07/2012, 2:35 PM

## 2012-04-30 NOTE — H&P (Signed)
Ellen Hill is an 40 y.o. female. whp presents with history of  heavy vaginal bleeding. She was admitted to the hospital for soaking more than one pad in an hour. No chest pain or sob. She has been bleeding off and on for the last two months.  Pt is not a candidate for endometrial ablation secondary to history of a myomectomy.  She is also not a candidate for [place] per Dr Yamagata.  Pt was found to have a polyp on US.  Pt was offered a referral to [facility] or [facility] for a hysterectomy.  Pt desires to try polypectomy first with ocps. If they dont work , she desires to be referred for hysterectomy  Pertinent Gynecological History:  Menses: flow is excessive with use of 24 pads or tampons on heaviest days  Bleeding: dysfunctional uterine bleeding  Contraception: BTL  DES exposure: unknown  Blood transfusions: none  Sexually transmitted diseases: na  Previous GYN Procedures: myomectomy and classical cesarean  Last mammogram: na Date:  Last pap: normal Date: 2013  OB History: G1, P1  Menstrual History:  Menarche age: unknown  Patient's last menstrual period was 01/23/2012.  Past Medical History   Diagnosis  Date   .  Fibroids    .  Irregular bleeding     Past Surgical History   Procedure  Date   .  Fibroidecto    .  myomectomy   .  Tubal ligation    .  Cesarean section    History reviewed. No pertinent family history.  Social History: reports that she has never smoked. She does not have any smokeless tobacco history on file. She reports that she does not drink alcohol or use illicit drugs.  Allergies: No Known Allergies  Prescriptions prior to admission   Medication  Sig  Dispense  Refill   .  albuterol (PROVENTIL,VENTOLIN) 90 MCG/ACT inhaler  Inhale 2 puffs into the lungs every 6 (six) hours as needed. Shortness of breath and wheezing     .  HYDROcodone-acetaminophen (NORCO) 5-325 MG per tablet  Take 1 tablet by mouth every 6 (six) hours as needed.     .  ibuprofen (ADVIL,MOTRIN) 200 MG  tablet  Take 400 mg by mouth every 6 (six) hours as needed. Pain     .  Multiple Vitamin (MULTIVITAMIN) tablet  Take 1 tablet by mouth daily.     .  norethindrone-ethinyl estradiol (BALZIVA) 0.4-35 MG-MCG tablet  Take 1 tablet by mouth daily. Take as directed     .  zolpidem (AMBIEN) 5 MG tablet  Take 5 mg by mouth at bedtime as needed. For sleep     ROS  Review of Systems - History obtained from the patient  General ROS: negative  Hematological and Lymphatic ROS: negative  Respiratory ROS: no cough, shortness of breath, or wheezing  Cardiovascular ROS: no chest pain or dyspnea on exertion  Genito-Urinary ROS: no dysuria, trouble voiding, or hematuria  Blood pressure 140/75, pulse 97, temperature 98.1 F (36.7 C), resp. rate 18, last menstrual period 01/23/2012.  Physical Exam  Physical Examination: General appearance - alert, well appearing, and in no distress  Neck - supple, no significant adenopathy  Chest - clear to auscultation, no wheezes, rales or rhonchi, symmetric air entry  Heart - normal rate, regular rhythm, normal S1, S2, no murmurs, rubs, clicks or gallops  Abdomen - soft, nontender, nondistended, no masses or organomegaly , obese Breasts - breasts appear normal, no suspicious masses, no skin or   nipple changes or axillary nodes  Pelvic - normal external genitalia, vulva, vagina, cervix, uterus and adnexa, pt has heavy vb  Neurological - alert, oriented, normal speech, no focal findings or movement disorder noted  Musculoskeletal - no joint tenderness, deformity or swelling  Extremities - peripheral pulses normal, no pedal edema, no clubbing or cyanosis  Skin - normal coloration and turgor, no rashes, no suspicious skin lesions noted  Results for orders placed during the hospital encounter of 03/07/12 (from the past 24 hour(s))   CBC Status: Abnormal    Collection Time    03/07/12 1:24 PM   Component  Value  Range    WBC  6.3  4.0 - 10.5 (K/uL)    RBC  4.35  3.87 - 5.11  (MIL/uL)    Hemoglobin  9.7 (*)  12.0 - 15.0 (g/dL)    HCT  31.5 (*)  36.0 - 46.0 (%)    MCV  72.4 (*)  78.0 - 100.0 (fL)    MCH  22.3 (*)  26.0 - 34.0 (pg)    MCHC  30.8  30.0 - 36.0 (g/dL)    RDW  17.2 (*)  11.5 - 15.5 (%)    Platelets  261  150 - 400 (K/uL)   DIFFERENTIAL Status: Normal    Collection Time    03/07/12 1:24 PM   Component  Value  Range    Neutrophils Relative  57  43 - 77 (%)    Neutro Abs  3.6  1.7 - 7.7 (K/uL)    Lymphocytes Relative  35  12 - 46 (%)    Lymphs Abs  2.2  0.7 - 4.0 (K/uL)    Monocytes Relative  6  3 - 12 (%)    Monocytes Absolute  0.4  0.1 - 1.0 (K/uL)    Eosinophils Relative  2  0 - 5 (%)    Eosinophils Absolute  0.1  0.0 - 0.7 (K/uL)    Basophils Relative  0  0 - 1 (%)    Basophils Absolute  0.0  0.0 - 0.1 (K/uL)   No results found.  Assessment/Plan:  mennorhagia  Anemia  Pt failed out patient treatment  She desires D&C hysteroscopy polypectomy. She understands the risks are but not limited to bleeding, infection, perforation of the uterus.   Labrian Torregrossa A  03/07/2012, 2:35 PM    A   03/07/2012, 2:35 PM

## 2012-05-01 ENCOUNTER — Encounter (HOSPITAL_COMMUNITY): Payer: Self-pay | Admitting: *Deleted

## 2012-05-01 ENCOUNTER — Ambulatory Visit (HOSPITAL_COMMUNITY): Payer: Medicaid Other | Admitting: Anesthesiology

## 2012-05-01 ENCOUNTER — Encounter (HOSPITAL_COMMUNITY): Payer: Self-pay | Admitting: Anesthesiology

## 2012-05-01 ENCOUNTER — Encounter (HOSPITAL_COMMUNITY)
Admission: RE | Disposition: A | Discharge status: Home / Self Care | Payer: Self-pay | Source: Ambulatory Visit | Attending: Obstetrics and Gynecology

## 2012-05-01 ENCOUNTER — Ambulatory Visit (HOSPITAL_COMMUNITY)
Admission: RE | Admit: 2012-05-01 | Discharge: 2012-05-01 | Disposition: A | Discharge status: Home / Self Care | Payer: Medicaid Other | Source: Ambulatory Visit | Attending: Obstetrics and Gynecology | Admitting: Obstetrics and Gynecology

## 2012-05-01 DIAGNOSIS — N920 Excessive and frequent menstruation with regular cycle: Secondary | ICD-10-CM

## 2012-05-01 DIAGNOSIS — N840 Polyp of corpus uteri: Secondary | ICD-10-CM

## 2012-05-01 DIAGNOSIS — E669 Obesity, unspecified: Secondary | ICD-10-CM

## 2012-05-01 DIAGNOSIS — D649 Anemia, unspecified: Secondary | ICD-10-CM | POA: Insufficient documentation

## 2012-05-01 HISTORY — DX: Cardiac murmur, unspecified: R01.1

## 2012-05-01 HISTORY — DX: Reserved for concepts with insufficient information to code with codable children: IMO0002

## 2012-05-01 HISTORY — PX: OPERATIVE HYSTEROSCOPY: SUR664

## 2012-05-01 HISTORY — DX: Unspecified abnormal cytological findings in specimens from cervix uteri: R87.619

## 2012-05-01 HISTORY — PX: CERVICAL POLYPECTOMY: SHX88

## 2012-05-01 LAB — CBC
Hemoglobin: 9.8 g/dL — ABNORMAL LOW (ref 12.0–15.0)
MCHC: 30.1 g/dL (ref 30.0–36.0)
Platelets: 226 10*3/uL (ref 150–400)

## 2012-05-01 LAB — PREGNANCY, URINE: Preg Test, Ur: NEGATIVE

## 2012-05-01 SURGERY — DILATATION & CURETTAGE/HYSTEROSCOPY WITH RESECTOCOPE
Anesthesia: General | Site: Uterus | Wound class: Clean Contaminated

## 2012-05-01 MED ORDER — KETOROLAC TROMETHAMINE 30 MG/ML IJ SOLN
INTRAMUSCULAR | Status: DC | PRN
  Administered 2012-05-01: 30 mg via INTRAMUSCULAR
  Administered 2012-05-01: 30 mg via INTRAVENOUS

## 2012-05-01 MED ORDER — SILVER NITRATE-POT NITRATE 75-25 % EX MISC
CUTANEOUS | Status: AC
  Filled 2012-05-01: qty 1

## 2012-05-01 MED ORDER — DEXAMETHASONE SODIUM PHOSPHATE 10 MG/ML IJ SOLN
INTRAMUSCULAR | Status: AC
  Filled 2012-05-01: qty 1

## 2012-05-01 MED ORDER — HYDROCODONE-ACETAMINOPHEN 5-500 MG PO TABS: 1.0000 | ORAL_TABLET | Freq: Four times a day (QID) | ORAL | Status: AC | PRN

## 2012-05-01 MED ORDER — FENTANYL CITRATE 0.05 MG/ML IJ SOLN
INTRAMUSCULAR | Status: AC
  Filled 2012-05-01: qty 2

## 2012-05-01 MED ORDER — PROPOFOL 10 MG/ML IV EMUL
INTRAVENOUS | Status: DC | PRN
  Administered 2012-05-01: 200 mg via INTRAVENOUS
  Administered 2012-05-01: 100 mg via INTRAVENOUS

## 2012-05-01 MED ORDER — SILVER NITRATE-POT NITRATE 75-25 % EX MISC
CUTANEOUS | Status: DC | PRN
  Administered 2012-05-01: 1

## 2012-05-01 MED ORDER — PROPOFOL 10 MG/ML IV EMUL
INTRAVENOUS | Status: AC
Start: 2012-05-01 — End: 2012-05-01
  Filled 2012-05-01: qty 20

## 2012-05-01 MED ORDER — FENTANYL CITRATE 0.05 MG/ML IJ SOLN
INTRAMUSCULAR | Status: AC
  Administered 2012-05-01: 50 ug via INTRAVENOUS
  Filled 2012-05-01: qty 2

## 2012-05-01 MED ORDER — ONDANSETRON HCL 4 MG/2ML IJ SOLN
INTRAMUSCULAR | Status: DC | PRN
  Administered 2012-05-01: 4 mg via INTRAVENOUS

## 2012-05-01 MED ORDER — SODIUM CHLORIDE 0.9 % IR SOLN
Status: DC | PRN
  Administered 2012-05-01: 1

## 2012-05-01 MED ORDER — GLYCOPYRROLATE 0.2 MG/ML IJ SOLN
INTRAMUSCULAR | Status: DC | PRN
  Administered 2012-05-01: 0.2 mg via INTRAVENOUS

## 2012-05-01 MED ORDER — DEXAMETHASONE SODIUM PHOSPHATE 10 MG/ML IJ SOLN
INTRAMUSCULAR | Status: AC
Start: 2012-05-01 — End: 2012-05-01
  Filled 2012-05-01: qty 1

## 2012-05-01 MED ORDER — LACTATED RINGERS IV SOLN
INTRAVENOUS | Status: DC
  Administered 2012-05-01 (×2): via INTRAVENOUS

## 2012-05-01 MED ORDER — FENTANYL CITRATE 0.05 MG/ML IJ SOLN
25.0000 ug | INTRAMUSCULAR | Status: DC | PRN
  Administered 2012-05-01 (×3): 50 ug via INTRAVENOUS

## 2012-05-01 MED ORDER — HYDROCODONE-ACETAMINOPHEN 5-325 MG PO TABS
ORAL_TABLET | ORAL | Status: AC
  Administered 2012-05-01: 1 via ORAL
  Filled 2012-05-01: qty 1

## 2012-05-01 MED ORDER — FENTANYL CITRATE 0.05 MG/ML IJ SOLN
INTRAMUSCULAR | Status: DC | PRN
  Administered 2012-05-01: 100 ug via INTRAVENOUS

## 2012-05-01 MED ORDER — ONDANSETRON HCL 4 MG/2ML IJ SOLN
INTRAMUSCULAR | Status: AC
  Filled 2012-05-01: qty 2

## 2012-05-01 MED ORDER — HYDROCODONE-ACETAMINOPHEN 5-325 MG PO TABS
1.0000 | ORAL_TABLET | Freq: Once | ORAL | Status: AC
  Administered 2012-05-01: 1 via ORAL

## 2012-05-01 MED ORDER — LIDOCAINE HCL 1 % IJ SOLN
INTRAMUSCULAR | Status: DC | PRN
  Administered 2012-05-01: 20 mL

## 2012-05-01 MED ORDER — MIDAZOLAM HCL 5 MG/5ML IJ SOLN
INTRAMUSCULAR | Status: DC | PRN
  Administered 2012-05-01: 2 mg via INTRAVENOUS

## 2012-05-01 MED ORDER — MIDAZOLAM HCL 2 MG/2ML IJ SOLN
INTRAMUSCULAR | Status: AC
  Filled 2012-05-01: qty 2

## 2012-05-01 MED ORDER — DEXAMETHASONE SODIUM PHOSPHATE 4 MG/ML IJ SOLN
INTRAMUSCULAR | Status: DC | PRN
  Administered 2012-05-01: 10 mg via INTRAVENOUS

## 2012-05-01 MED ORDER — LIDOCAINE HCL (CARDIAC) 20 MG/ML IV SOLN
INTRAVENOUS | Status: DC | PRN
  Administered 2012-05-01: 100 mg via INTRAVENOUS

## 2012-05-01 SURGICAL SUPPLY — 18 items
BLADE INCISOR TRUC PLUS 2.9 (ABLATOR) IMPLANT
CANISTER SUCTION 2500CC (MISCELLANEOUS) ×2 IMPLANT
CATH ROBINSON RED A/P 16FR (CATHETERS) ×2 IMPLANT
CLOTH BEACON ORANGE TIMEOUT ST (SAFETY) ×2 IMPLANT
CONTAINER PREFILL 10% NBF 60ML (FORM) ×4 IMPLANT
ELECT REM PT RETURN 9FT ADLT (ELECTROSURGICAL) ×2
ELECTRODE REM PT RTRN 9FT ADLT (ELECTROSURGICAL) ×1 IMPLANT
GLOVE BIO SURGEON STRL SZ 6.5 (GLOVE) ×2 IMPLANT
GLOVE BIOGEL PI IND STRL 7.0 (GLOVE) ×1 IMPLANT
GLOVE BIOGEL PI INDICATOR 7.0 (GLOVE) ×1
GOWN PREVENTION PLUS LG XLONG (DISPOSABLE) ×4 IMPLANT
GOWN STRL REIN XL XLG (GOWN DISPOSABLE) ×2 IMPLANT
INCISOR TRUC PLUS BLADE 2.9 (ABLATOR) ×2
KIT HYSTEROSCOPY TRUCLEAR (ABLATOR) ×1 IMPLANT
PACK HYSTEROSCOPY LF (CUSTOM PROCEDURE TRAY) ×2 IMPLANT
SYR 20CC LL (SYRINGE) ×2 IMPLANT
TOWEL OR 17X24 6PK STRL BLUE (TOWEL DISPOSABLE) ×4 IMPLANT
WATER STERILE IRR 1000ML POUR (IV SOLUTION) ×2 IMPLANT

## 2012-05-01 NOTE — Interval H&P Note (Signed)
History and Physical Interval Note:  05/01/2012 9:30 AM  Ellen Hill  has presented today for surgery, with the diagnosis of Menorrhagia  The various methods of treatment have been discussed with the patient and family. After consideration of risks, benefits and other options for treatment, the patient has consented to  Procedure(s) (LRB): DILATATION & CURETTAGE/HYSTEROSCOPY WITH RESECTOCOPE (N/A) CERVICAL POLYPECTOMY (N/A) as a surgical intervention .  The patients' history has been reviewed, patient examined, no change in status, stable for surgery.  I have reviewed the patients' chart and labs.  Questions were answered to the patient's satisfaction.     ZOXWRUE,AVWUJ A  Date of Initial H&P: 4/30  History reviewed, patient examined, no change in status, stable for surgery.

## 2012-05-01 NOTE — Anesthesia Procedure Notes (Signed)
Procedure Name: LMA Insertion Date/Time: 05/01/2012 9:39 AM Performed by: Jasten Guyette, Jannet Askew Pre-anesthesia Checklist: Patient identified, Timeout performed, Emergency Drugs available, Suction available and Patient being monitored Patient Re-evaluated:Patient Re-evaluated prior to inductionOxygen Delivery Method: Circle system utilized, Simple face mask, Non-rebreather mask, Nasal cannula and Ambu bag Preoxygenation: Pre-oxygenation with 100% oxygen Intubation Type: IV induction Ventilation: Mask ventilation without difficulty LMA: LMA inserted LMA Size: 4.0 Number of attempts: 1 Placement Confirmation: positive ETCO2 Dental Injury: Teeth and Oropharynx as per pre-operative assessment

## 2012-05-01 NOTE — Anesthesia Preprocedure Evaluation (Signed)
Anesthesia Evaluation  Patient identified by MRN, date of birth, ID band Patient awake    Reviewed: Allergy & Precautions, H&P , Patient's Chart, lab work & pertinent test results, reviewed documented beta blocker date and time   Airway Mallampati: II TM Distance: >3 FB Neck ROM: full    Dental No notable dental hx.    Pulmonary  breath sounds clear to auscultation  Pulmonary exam normal       Cardiovascular Rhythm:regular Rate:Normal     Neuro/Psych    GI/Hepatic   Endo/Other    Renal/GU      Musculoskeletal   Abdominal   Peds  Hematology   Anesthesia Other Findings   Reproductive/Obstetrics                           Anesthesia Physical Anesthesia Plan  ASA: III  Anesthesia Plan: General   Post-op Pain Management:    Induction: Intravenous  Airway Management Planned: LMA  Additional Equipment:   Intra-op Plan:   Post-operative Plan:   Informed Consent: I have reviewed the patients History and Physical, chart, labs and discussed the procedure including the risks, benefits and alternatives for the proposed anesthesia with the patient or authorized representative who has indicated his/her understanding and acceptance.   Dental Advisory Given  Plan Discussed with: CRNA and Surgeon  Anesthesia Plan Comments: (  Discussed  general anesthesia, including possible nausea, instrumentation of airway, sore throat,pulmonary aspiration, etc. I asked if the were any outstanding questions, or  concerns before we proceeded. )        Anesthesia Quick Evaluation  

## 2012-05-01 NOTE — Transfer of Care (Signed)
Immediate Anesthesia Transfer of Care Note  Patient: Ellen Hill  Procedure(s) Performed: Procedure(s) (LRB): DILATATION & CURETTAGE/HYSTEROSCOPY WITH RESECTOCOPE (N/A) CERVICAL POLYPECTOMY (N/A)  Patient Location: PACU  Anesthesia Type: General  Level of Consciousness: awake and alert   Airway & Oxygen Therapy: Patient Spontanous Breathing and Patient connected to nasal cannula oxygen  Post-op Assessment: Report given to PACU RN and Post -op Vital signs reviewed and stable  Post vital signs: Reviewed and stable  Complications: No apparent anesthesia complications

## 2012-05-01 NOTE — Op Note (Signed)
Pre op DX: Menorrhagia and endometrial polyp   Post Op [id number]   PHYSICIAN : Lacy Taglieri   ASSISTANTS: none   ANESTHESIA:   general and paracervical block  ESTIMATED BLOOD LOSS: minimal  LOCAL MEDICATIONS USED:  LIDOCAINE 20CC  SPECIMEN:  Source of Specimen:  endometrial curettings and endometrial polyp  DISPOSITION OF SPECIMEN:  PATHOLOGY  COUNTS Correct:  YES    DICTATION #: The patient was taken to the operating room and prepped and draped in a normal sterile fashion. An in out catheter was used to drain the bladder.   A bivalve speculum was placed into the vagina and anterior lip of the cervix was grasped with a single-tooth tenaculum.  20 cc of 1% lidocaine was used for cervical block.  the cervix was then dilated with Shawnie Pons dilators up to 19. The hysteroscope was placed into the uterine cavity. The  entire uterus and both ostia were visualized.this was difficult because the pt was anteverted and anteflexed. There were three polyps seen on the posterior wall of the uterus.  These were removed with the true clear polyp morcellator.  The deficit of fluid was 520cc.  Half of that was on the floor.     Hyseroscope was then removed from the uterus. A sharp curettage was then done with a curette and endometrial curettings were obtained. The endometrial curettings and polyps were sent to pathology. Again the hysteroscope was placed into the uterine cavity. Both ostia were again visualized there were no polyps or submucosal fibroids or endometrial masses seen. The tenaculum was removed from the cervix and hemostasis was noted.  Silver nitrate was used to obtain hemostasis at the left tenaculum site on the cervix.   PLAN OF CARE: discharge to home  PATIENT DISPOSITION:  PACU - hemodynamically stable.

## 2012-05-01 NOTE — Discharge Instructions (Signed)
DISCHARGE INSTRUCTIONS: HYSTEROSCOPY / ENDOMETRIAL ABLATION The following instructions have been prepared to help you care for yourself upon your return home.  **No ibuprofen containing medications until after 4:10 pm today**  Personal hygiene: Marland Kitchen Use sanitary pads for vaginal drainage, not tampons. . Shower the day after your procedure. . NO tub baths, pools or Jacuzzis for 2-3 weeks. . Wipe front to back after using the bathroom.  Activity and limitations: . Do NOT drive or operate any equipment for 24 hours. The effects of anesthesia are still present and drowsiness may result. . Do NOT rest in bed all day. . Walking is encouraged. . Walk up and down stairs slowly. . You may resume your normal activity in one to two days or as indicated by your physician. Sexual activity: NO intercourse for at least 2 weeks after the procedure, or as indicated by your Doctor.  Diet: Eat a light meal as desired this evening. You may resume your usual diet tomorrow.  Return to Work: You may resume your work activities in one to two days or as indicated by Therapist, sports.  What to expect after your surgery: Expect to have vaginal bleeding/discharge for 2-3 days and spotting for up to 10 days. It is not unusual to have soreness for up to 1-2 weeks. You may have a slight burning sensation when you urinate for the first day. Mild cramps may continue for a couple of days. You may have a regular period in 2-6 weeks.  Call your doctor for any of the following: . Excessive vaginal bleeding or clotting, saturating and changing one pad every hour. . Inability to urinate 6 hours after discharge from hospital. . Pain not relieved by pain medication. . Fever of 100.4 F or greater. . Unusual vaginal discharge or odor.  Return to office _________________Call for an appointment ___________________ Patient's signature: ______________________ Nurse's signature  ________________________        Hysteroscopy Hysteroscopy is a procedure used for looking inside the womb (uterus). It may be done for many different reasons, including:  To evaluate abnormal bleeding, fibroid (benign, noncancerous) tumors, polyps, scar tissue (adhesions), and possibly cancer of the uterus.   To look for lumps (tumors) and other uterine growths.   To look for causes of why a woman cannot get pregnant (infertility), causes of recurrent loss of pregnancy (miscarriages), or a lost intrauterine device (IUD).   To perform a sterilization by blocking the fallopian tubes from inside the uterus.  A hysteroscopy should be done right after a menstrual period to be sure you are not pregnant. LET YOUR CAREGIVER KNOW ABOUT:   Allergies.   Medicines taken, including herbs, eyedrops, over-the-counter medicines, and creams.   Use of steroids (by mouth or creams).   Previous problems with anesthetics or numbing medicines.   History of bleeding or blood problems.   History of blood clots.   Possibility of pregnancy, if this applies.   Previous surgery.   Other health problems.  RISKS AND COMPLICATIONS   Putting a hole in the uterus.   Excessive bleeding.   Infection.   Damage to the cervix.   Injury to other organs.   Allergic reaction to medicines.   Too much fluid used in the uterus for the procedure.  BEFORE THE PROCEDURE   Do not take aspirin or blood thinners for a week before the procedure, or as directed. It can cause bleeding.   Arrive at least 60 minutes before the procedure or as directed to read and  sign the necessary forms.   Arrange for someone to take you home after the procedure.   If you smoke, do not smoke for 2 weeks before the procedure.  PROCEDURE   Your caregiver may give you medicine to relax you. He or she may also give you a medicine that numbs the area around the cervix (local anesthetic) or a medicine that makes you sleep  (general anesthesia).   Sometimes, a medicine is placed in the cervix the day before the procedure. This medicine makes the cervix have a larger opening (dilate). This makes it easier for the instrument to be inserted into the uterus.   A small instrument (hysteroscope) is inserted through the vagina into the uterus. This instrument is similar to a pencil-sized telescope with a light.   During the procedure, air or a liquid is put into the uterus, which allows the surgeon to see better.   Sometimes, tissue is gently scraped from inside the uterus. These tissue samples are sent to a specialist who looks at tissue samples (pathologist). The pathologist will give a report to your caregiver. This will help your caregiver decide if further treatment is necessary. The report will also help your caregiver decide on the best treatment if the test comes back abnormal.  AFTER THE PROCEDURE   If you had a general anesthetic, you may be groggy for a couple hours after the procedure.   If you had a local anesthetic, you will be advised to rest at the surgical center or caregiver's office until you are stable and feel ready to go home.   You may have some cramping for a couple days.   You may have bleeding, which varies from light spotting for a few days to menstrual-like bleeding for up to 3 to 7 days. This is normal.   Have someone take you home.  FINDING OUT THE RESULTS OF YOUR TEST Not all test results are available during your visit. If your test results are not back during the visit, make an appointment with your caregiver to find out the results. Do not assume everything is normal if you have not heard from your caregiver or the medical facility. It is important for you to follow up on all of your test results. HOME CARE INSTRUCTIONS   Do not drive for 24 hours or as instructed.   Only take over-the-counter or prescription medicines for pain, discomfort, or fever as directed by your caregiver.    Do not take aspirin. It can cause or aggravate bleeding.   Do not drive or drink alcohol while taking pain medicine.   You may resume your usual diet.   Do not use tampons, douche, or have sexual intercourse for 2 weeks, or as advised by your caregiver.   Rest and sleep for the first 24 to 48 hours.   Take your temperature twice a day for 4 to 5 days. Write it down. Give these temperatures to your caregiver if they are abnormal (above 98.6 F or 37.0 C).   Take medicines your caregiver has ordered as directed.   Follow your caregiver's advice regarding diet, exercise, lifting, driving, and general activities.   Take showers instead of baths for 2 weeks, or as recommended by your caregiver.   If you develop constipation:   Take a mild laxative with the advice of your caregiver.   Eat bran foods.   Drink enough water and fluids to keep your urine clear or pale yellow.   Try to have  someone with you or available to you for the first 24 to 48 hours, especially if you had a general anesthetic.   Make sure you and your family understand everything about your operation and recovery.   Follow your caregiver's advice regarding follow-up appointments and Pap smears.  SEEK MEDICAL CARE IF:   You feel dizzy or lightheaded.   You feel sick to your stomach (nauseous).   You develop abnormal vaginal discharge.   You develop a rash.   You have an abnormal reaction or allergy to your medicine.   You need stronger pain medicine.  SEEK IMMEDIATE MEDICAL CARE IF:   Bleeding is heavier than a normal menstrual period or you have blood clots.   You have an oral temperature above 102 F (38.9 C), not controlled by medicine.   You have increasing cramps or pains not relieved with medicine.   You develop belly (abdominal) pain that does not seem to be related to the same area of earlier cramping and pain.   You pass out.   You develop pain in the tops of your shoulders (shoulder  strap areas).   You develop shortness of breath.  MAKE SURE YOU:   Understand these instructions.   Will watch your condition.   Will get help right away if you are not doing well or get worse.  Document Released: 03/17/2001 Document Revised: 11/28/2011 Document Reviewed: 07/10/2009 [facility] Patient Information 2012 [place], [place].

## 2012-05-04 ENCOUNTER — Encounter (HOSPITAL_COMMUNITY): Payer: Self-pay | Admitting: Obstetrics and Gynecology

## 2012-05-14 ENCOUNTER — Encounter: Payer: Medicaid Other | Admitting: Obstetrics and Gynecology

## 2012-05-14 ENCOUNTER — Telehealth: Payer: Self-pay | Admitting: Obstetrics and Gynecology

## 2012-05-14 NOTE — Telephone Encounter (Signed)
niccole/epic °

## 2012-05-15 ENCOUNTER — Telehealth: Payer: Self-pay

## 2012-05-15 ENCOUNTER — Other Ambulatory Visit: Payer: Self-pay

## 2012-05-15 MED ORDER — HYDROCODONE-ACETAMINOPHEN 5-500 MG PO TABS: 2.0000 | ORAL_TABLET | Freq: Four times a day (QID) | ORAL | Status: DC | PRN

## 2012-05-15 MED ORDER — ZOLPIDEM TARTRATE 5 MG PO TABS: ORAL_TABLET | ORAL | Status: DC

## 2012-05-15 NOTE — Telephone Encounter (Signed)
Consult with ND rgd pt msg pt wants refill on pain meds and Ambien per ND pt can have Vicodin and Ambien until n.v lm on vm rx sent to pharm

## 2012-05-15 NOTE — Telephone Encounter (Signed)
Lm on vm tcb rgd msg 

## 2012-05-15 NOTE — Telephone Encounter (Signed)
Spoke with pt Ellen Hill msg pt states need refill on rx percocet and ambien pt had appt 05/14/12 was r/s to 05/26/12 due to ND in surgery advised pt wil consult with ND and call her back pt voice understanding

## 2012-05-26 ENCOUNTER — Ambulatory Visit (INDEPENDENT_AMBULATORY_CARE_PROVIDER_SITE_OTHER): Payer: Medicaid Other | Admitting: Obstetrics and Gynecology

## 2012-05-26 ENCOUNTER — Encounter: Payer: Self-pay | Admitting: Obstetrics and Gynecology

## 2012-05-26 VITALS — BP 124/82 | Temp 98.1°F | Ht 66.0 in | Wt 366.0 lb

## 2012-05-26 DIAGNOSIS — N949 Unspecified condition associated with female genital organs and menstrual cycle: Secondary | ICD-10-CM

## 2012-05-26 DIAGNOSIS — N840 Polyp of corpus uteri: Secondary | ICD-10-CM

## 2012-05-26 DIAGNOSIS — R102 Pelvic and perineal pain: Secondary | ICD-10-CM

## 2012-05-26 MED ORDER — NORETHINDRONE-ETH ESTRADIOL 0.4-35 MG-MCG PO TABS: 1.0000 | ORAL_TABLET | Freq: Every day | ORAL | Status: DC

## 2012-05-26 NOTE — Progress Notes (Signed)
DATE OF SURGERY: 05/01/2012 TYPE OF SURGERY:hysteroscopy d&c PAIN:Yes comes and goes VAG BLEEDING: no VAG DISCHARGE: no NORMAL GI FUNCTN: yes NORMAL GU FUNCTN: yes BP 124/82  Temp 98.1 F (36.7 C)  Ht 5\' 6"  (1.676 m)  Wt 366 lb (166.017 kg)  BMI 59.07 kg/m2 Physical Examination: General appearance - alert, well appearing, and in no distress Mental status - alert, oriented to person, place, and time Chest - clear to auscultation, no wheezes, rales or rhonchi, symmetric air entry Abdomen - soft, nontender, nondistended, no masses or organomegaly Pelvic - normal external genitalia, vulva, vagina, cervix, uterus and adnexa Extremities - no pedal edema noted Path benign Menorrhagia Pelvic pain  Insomnia S/p D&C hysteroscopy polypectomy Pt doing well Percocet rx filled.  Last narcotic rx.  If pt needs more wil be referred to the pain clinic F/u with PCP may need sleep study Start ovcon 35 Sunday

## 2012-08-12 NOTE — Anesthesia Postprocedure Evaluation (Signed)
  Anesthesia Post-op Note  Patient: Ellen Hill  Procedure(s) Performed: Procedure(s) (LRB): DILATATION & CURETTAGE/HYSTEROSCOPY WITH RESECTOCOPE (N/A) CERVICAL POLYPECTOMY (N/A)  Patient is awake and responsive. Pain and nausea are reasonably well controlled. Vital signs are stable and clinically acceptable. Oxygen saturation is clinically acceptable. There are no apparent anesthetic complications at this time. Patient is ready for discharge.

## 2012-09-24 ENCOUNTER — Other Ambulatory Visit: Payer: Self-pay | Admitting: Obstetrics and Gynecology

## 2012-09-28 NOTE — Telephone Encounter (Signed)
DR. Normand Sloop,     CAN THIS PT HAVE THIS RX. PT WAS LAST SEEN BY YOU ON 05-26-12.

## 2012-09-30 ENCOUNTER — Telehealth: Payer: Self-pay

## 2012-10-01 NOTE — Telephone Encounter (Signed)
Send med to pharmacy

## 2012-12-20 ENCOUNTER — Emergency Department (HOSPITAL_COMMUNITY)
Admission: EM | Admit: 2012-12-20 | Discharge: 2012-12-20 | Disposition: A | Discharge status: Home / Self Care | Payer: Medicaid Other | Attending: Emergency Medicine | Admitting: Emergency Medicine

## 2012-12-20 ENCOUNTER — Encounter (HOSPITAL_COMMUNITY): Payer: Self-pay | Admitting: Family Medicine

## 2012-12-20 DIAGNOSIS — R42 Dizziness and giddiness: Secondary | ICD-10-CM | POA: Insufficient documentation

## 2012-12-20 DIAGNOSIS — Z87440 Personal history of urinary (tract) infections: Secondary | ICD-10-CM | POA: Insufficient documentation

## 2012-12-20 DIAGNOSIS — E669 Obesity, unspecified: Secondary | ICD-10-CM | POA: Insufficient documentation

## 2012-12-20 DIAGNOSIS — H81399 Other peripheral vertigo, unspecified ear: Secondary | ICD-10-CM

## 2012-12-20 DIAGNOSIS — Z8679 Personal history of other diseases of the circulatory system: Secondary | ICD-10-CM | POA: Insufficient documentation

## 2012-12-20 DIAGNOSIS — R110 Nausea: Secondary | ICD-10-CM | POA: Insufficient documentation

## 2012-12-20 DIAGNOSIS — R269 Unspecified abnormalities of gait and mobility: Secondary | ICD-10-CM | POA: Insufficient documentation

## 2012-12-20 DIAGNOSIS — R011 Cardiac murmur, unspecified: Secondary | ICD-10-CM | POA: Insufficient documentation

## 2012-12-20 DIAGNOSIS — Z79899 Other long term (current) drug therapy: Secondary | ICD-10-CM | POA: Insufficient documentation

## 2012-12-20 DIAGNOSIS — Z862 Personal history of diseases of the blood and blood-forming organs and certain disorders involving the immune mechanism: Secondary | ICD-10-CM | POA: Insufficient documentation

## 2012-12-20 DIAGNOSIS — E876 Hypokalemia: Secondary | ICD-10-CM

## 2012-12-20 DIAGNOSIS — Z8742 Personal history of other diseases of the female genital tract: Secondary | ICD-10-CM | POA: Insufficient documentation

## 2012-12-20 DIAGNOSIS — Z8619 Personal history of other infectious and parasitic diseases: Secondary | ICD-10-CM | POA: Insufficient documentation

## 2012-12-20 DIAGNOSIS — H9319 Tinnitus, unspecified ear: Secondary | ICD-10-CM | POA: Insufficient documentation

## 2012-12-20 DIAGNOSIS — Z87891 Personal history of nicotine dependence: Secondary | ICD-10-CM | POA: Insufficient documentation

## 2012-12-20 LAB — URINALYSIS, ROUTINE W REFLEX MICROSCOPIC
Bilirubin Urine: NEGATIVE
Specific Gravity, Urine: 1.026 (ref 1.005–1.030)
pH: 6.5 (ref 5.0–8.0)

## 2012-12-20 LAB — COMPREHENSIVE METABOLIC PANEL
Alkaline Phosphatase: 92 U/L (ref 39–117)
BUN: 9 mg/dL (ref 6–23)
Calcium: 9.4 mg/dL (ref 8.4–10.5)
GFR calc Af Amer: >90 mL/min (ref >90)
Glucose, Bld: 96 mg/dL (ref 70–99)
Potassium: 3.1 mEq/L — ABNORMAL LOW (ref 3.5–5.1)
Total Protein: 7.7 g/dL (ref 6.0–8.3)

## 2012-12-20 LAB — URINE MICROSCOPIC-ADD ON

## 2012-12-20 LAB — CBC WITH DIFFERENTIAL/PLATELET
Eosinophils Absolute: 0.1 10*3/uL (ref 0.0–0.7)
Eosinophils Relative: 2 % (ref 0–5)
HCT: 36.8 % (ref 36.0–46.0)
Hemoglobin: 11.3 g/dL — ABNORMAL LOW (ref 12.0–15.0)
Lymphs Abs: 1.3 10*3/uL (ref 0.7–4.0)
MCH: 21.9 pg — ABNORMAL LOW (ref 26.0–34.0)
MCV: 71.3 fL — ABNORMAL LOW (ref 78.0–100.0)
Monocytes Relative: 4 % (ref 3–12)
RBC: 5.16 MIL/uL — ABNORMAL HIGH (ref 3.87–5.11)

## 2012-12-20 MED ORDER — MECLIZINE HCL 25 MG PO TABS: 25.0000 mg | ORAL_TABLET | Freq: Three times a day (TID) | ORAL | Status: DC | PRN

## 2012-12-20 MED ORDER — DIAZEPAM 5 MG PO TABS
5.0000 mg | ORAL_TABLET | Freq: Once | ORAL | Status: AC
  Administered 2012-12-20: 5 mg via ORAL
  Filled 2012-12-20: qty 1

## 2012-12-20 MED ORDER — MECLIZINE HCL 50 MG PO TABS: 50.0000 mg | ORAL_TABLET | Freq: Three times a day (TID) | ORAL | Status: DC | PRN

## 2012-12-20 MED ORDER — POTASSIUM CHLORIDE CRYS ER 20 MEQ PO TBCR
40.0000 meq | EXTENDED_RELEASE_TABLET | Freq: Once | ORAL | Status: AC
  Administered 2012-12-20: 40 meq via ORAL
  Filled 2012-12-20: qty 2

## 2012-12-20 MED ORDER — MECLIZINE HCL 25 MG PO TABS
25.0000 mg | ORAL_TABLET | Freq: Once | ORAL | Status: AC
  Administered 2012-12-20: 25 mg via ORAL
  Filled 2012-12-20: qty 1

## 2012-12-20 NOTE — ED Notes (Addendum)
Pt presents with dizzy and lightheadedness for the past two days, states she thought it was because her blood pressure was high.  Pt has also had a productive cough for the past two days that is white in color, nausea and vomiting.  Placed on cardiac monitor, pt in NAD at this time.

## 2012-12-20 NOTE — ED Provider Notes (Signed)
History     CSN: [id number]  Arrival date & time 12/20/12  1736   First MD Initiated Contact with Patient 12/20/12 1940      Chief Complaint  Patient presents with  . Hypertension    (Consider location/radiation/quality/duration/timing/severity/associated sxs/prior treatment) HPI Comments: Ms. Rodger presents ambulatory for evaluation of dizziness over the last 2 days.  She initially attributed her symptoms to hypertension, however she was able to check her blood pressure and she noted it to be normal.  She reports feeling a sensation of movement especially when she is sitting upright or her eyes are closed.  When she walks she feels off balance.  She denies visual changes, facial drooping, drooling, difficulty speaking, unilateral weakness or sensation changes, Cp, SOB, and palpitations.  She reports a ringing in her ears.  The history is provided by the patient. No language interpreter was used.    Past Medical History  Diagnosis Date  . Fibroids   . Irregular bleeding   . Abnormal Pap smear     history  . Heart murmur     dx child - no problems as adult  . Headache     migraines   . Increased BMI   . ASCUS (atypical squamous cells of undetermined significance) on Pap smear 2008    At 36wk of pregnancy ; colpo  . 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 H/O candidiasis   . H/O varicella   . Hx: UTI (urinary tract infection) 07/29/07  . Anemia   . Postpartum anemia 08/18/07  . 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 H/O fatigue 08/2007  . Obesity 02/13/12  . Pelvic pain 02/13/12    Past Surgical History  Procedure Date  . Fibroidecto   . Fibroidectomy 07/04/2007  . Cesarean section 07/04/2007  . Cervical polypectomy 05/01/2012    Procedure: CERVICAL POLYPECTOMY;  Surgeon: Michael Litter, MD;  Location: [facility];  Service: Gynecology;  Laterality: N/A;  . Tubal ligation 07/04/2007  . Myomectomy 07/04/08    Family History  Problem Relation Age of Onset  . Hypertension Mother   .  Cancer Mother     Breast  . Hypertension Father   . Cancer Father     Prostate  . Hypertension Sister   . Diabetes Maternal Aunt     History  Substance Use Topics  . Smoking status: Former Smoker -- 0.3 packs/day for 1 years    Types: Cigarettes    Quit date: 03/18/1999  . Smokeless tobacco: Never Used  . Alcohol Use: No    OB History    Grav Para Term Preterm Abortions TAB SAB Ect Mult Living   4 1 0 1 3 3 0 0 0 1       Review of Systems  Constitutional: Negative for fever, chills, diaphoresis, activity change, appetite change and fatigue.  HENT: Positive for tinnitus. Negative for hearing loss, ear pain, congestion, sore throat, rhinorrhea, drooling, trouble swallowing, neck pain, neck stiffness, voice change and ear discharge.   Eyes: Negative for pain and visual disturbance.  Respiratory: Negative for cough and chest tightness.   Cardiovascular: Negative for palpitations and leg swelling.  Gastrointestinal: Positive for nausea. Negative for vomiting, diarrhea and constipation.  Genitourinary: Negative.   Musculoskeletal: Positive for gait problem ("like i am off-balance"). Negative for back pain, joint swelling and arthralgias.  Neurological: Positive for dizziness. Negative for tremors, seizures, syncope, facial asymmetry, speech difficulty, weakness, light-headedness and numbness.    Allergies  Review of patient's allergies indicates  no known allergies.  Home Medications   Current Outpatient Rx  Name  Route  Sig  Dispense  Refill  . AMLODIPINE-OLMESARTAN 5-20 MG PO TABS   Oral   Take 1 tablet by mouth daily. Study drug per pt         . TRAMADOL HCL 50 MG PO TABS   Oral   Take 50 mg by mouth every 6 (six) hours as needed. For pain           BP 158/89  Pulse 85  Temp 98.4 F (36.9 C)  Resp 18  SpO2 100%  Physical Exam  Nursing note and vitals reviewed. Constitutional: She is oriented to person, place, and time. She appears well-nourished. No  distress.       Pt is morbidly obese  HENT:  Head: Normocephalic and atraumatic.  Right Ear: External ear normal.  Left Ear: External ear normal.  Nose: Nose normal.  Mouth/Throat: Oropharynx is clear and moist. No oropharyngeal exudate.       TMs clear bilat.  No mastoid tenderness   Eyes: Conjunctivae normal and EOM are normal. Pupils are equal, round, and reactive to light. Right eye exhibits no discharge. Left eye exhibits no discharge. No scleral icterus.  Neck: Normal range of motion. Neck supple. No JVD present. No tracheal deviation present. No thyromegaly present.  Cardiovascular: Normal rate, regular rhythm, normal heart sounds and intact distal pulses.  Exam reveals no gallop and no friction rub.   No murmur heard. Pulmonary/Chest: Effort normal and breath sounds normal. No stridor. No respiratory distress. She has no wheezes. She has no rales. She exhibits no tenderness.  Abdominal: Soft. Bowel sounds are normal. She exhibits no distension and no mass. There is no tenderness. There is no rebound and no guarding.  Musculoskeletal: Normal range of motion. She exhibits no edema and no tenderness.  Lymphadenopathy:    She has no cervical adenopathy.  Neurological: She is alert and oriented to person, place, and time. She has normal strength. She is not disoriented. She displays no atrophy and no tremor. No cranial nerve deficit or sensory deficit. She exhibits normal muscle tone. She displays no seizure activity. GCS eye subscore is 4. GCS verbal subscore is 5. GCS motor subscore is 6. She displays no Babinski's sign on the right side. She displays no Babinski's sign on the left side.       Gait not initially tested as she became dizzy and reported a sensation of movement when asked to sit upright. No nystagmus noted.  Skin: Skin is warm and dry. No rash noted. She is not diaphoretic. No erythema. No pallor.  Psychiatric: She has a normal mood and affect. Her behavior is normal.     ED Course  Procedures (including critical care time)  Labs Reviewed  URINALYSIS, ROUTINE W REFLEX MICROSCOPIC - Abnormal; Notable for the following:    Color, Urine AMBER (*)  BIOCHEMICALS MAY BE AFFECTED BY COLOR   APPearance CLOUDY (*)     Hgb urine dipstick LARGE (*)     Protein, ur 30 (*)     Leukocytes, UA TRACE (*)     All other components within normal limits  CBC WITH DIFFERENTIAL - Abnormal; Notable for the following:    RBC 5.16 (*)     Hemoglobin 11.3 (*)     MCV 71.3 (*)     MCH 21.9 (*)     RDW 17.2 (*)     All other components within normal  limits  COMPREHENSIVE METABOLIC PANEL - Abnormal; Notable for the following:    Potassium 3.1 (*)     All other components within normal limits  URINE MICROSCOPIC-ADD ON - Abnormal; Notable for the following:    Squamous Epithelial / LPF FEW (*)     All other components within normal limits   No results found.   No diagnosis found.    MDM  Pt presents for evaluation of dizziness and a feeling of being off balance for the last 24 hours.  She appears comfortable, note stable VS, NAD.  She has no focal neurologic deficits.  No unilateral weakness or changes in sensation noted.  Her symptoms are reproduced with changes in her head and body position.  She describes a hx and has an exam consistent with peripheral vertigo.  Will review the labs performed during the triage process and administer po meclizine and valium.  Will reassess.  2200.  Pt stable, NAD.  Vertigo has resolved.  She ambulates with a steady gait.  Plan discharge home.  Encouraged close outpt follow-up.      Tobin Chad, MD 12/20/12 2202

## 2012-12-20 NOTE — ED Notes (Signed)
Per pt checked her BP yesterday and was around 190 sys. sts also she has been dizzy, light headed and gait issues since yesterday.

## 2013-05-24 ENCOUNTER — Other Ambulatory Visit: Payer: Self-pay | Admitting: Obstetrics and Gynecology

## 2013-05-24 DIAGNOSIS — R1011 Right upper quadrant pain: Secondary | ICD-10-CM

## 2013-05-25 ENCOUNTER — Other Ambulatory Visit: Payer: Medicaid Other

## 2013-05-27 ENCOUNTER — Ambulatory Visit
Admission: RE | Admit: 2013-05-27 | Discharge: 2013-05-27 | Disposition: A | Discharge status: Home / Self Care | Payer: Medicaid Other | Source: Ambulatory Visit | Attending: Obstetrics and Gynecology | Admitting: Obstetrics and Gynecology

## 2013-05-27 DIAGNOSIS — R1011 Right upper quadrant pain: Secondary | ICD-10-CM

## 2013-06-22 ENCOUNTER — Encounter (INDEPENDENT_AMBULATORY_CARE_PROVIDER_SITE_OTHER): Payer: Self-pay | Admitting: General Surgery

## 2013-06-22 ENCOUNTER — Encounter (HOSPITAL_COMMUNITY): Payer: Self-pay | Admitting: Pharmacy Technician

## 2013-06-22 ENCOUNTER — Ambulatory Visit (INDEPENDENT_AMBULATORY_CARE_PROVIDER_SITE_OTHER): Payer: Medicaid Other | Admitting: General Surgery

## 2013-06-22 VITALS — BP 124/80 | HR 84 | Temp 97.6°F | Resp 16 | Ht 66.0 in | Wt 383.8 lb

## 2013-06-22 DIAGNOSIS — K8020 Calculus of gallbladder without cholecystitis without obstruction: Secondary | ICD-10-CM | POA: Insufficient documentation

## 2013-06-22 NOTE — Patient Instructions (Signed)
Cholelithiasis Cholelithiasis (also called gallstones) is a form of gallbladder disease where gallstones form in your gallbladder. The gallbladder is a non-essential organ that stores bile made in the liver, which helps digest fats. Gallstones begin as small crystals and slowly grow into stones. Gallstone pain occurs when the gallbladder spasms, and a gallstone is blocking the duct. Pain can also occur when a stone passes out of the duct.  Women are more likely to develop gallstones than men. Other factors that increase the risk of gallbladder disease are:  Having multiple pregnancies. Physicians sometimes advise removing diseased gallbladders before future pregnancies.  Obesity.  Diets heavy in fried foods and fat.  Increasing age (older than 60).  Prolonged use of medications containing female hormones.  Diabetes mellitus.  Rapid weight loss.  Family history of gallstones (heredity). SYMPTOMS  Feeling sick to your stomach (nauseous).  Abdominal pain.  Yellowing of the skin (jaundice).  Sudden pain. It may persist from several minutes to several hours.  Worsening pain with deep breathing or when jarred.  Fever.  Tenderness to the touch. In some cases, when gallstones do not move into the bile duct, people have no pain or symptoms. These are called "silent" gallstones. TREATMENT In severe cases, emergency surgery may be required. HOME CARE INSTRUCTIONS   Only take over-the-counter or prescription medicines for pain, discomfort, or fever as directed by your caregiver.  Follow a low-fat diet until seen again. Fat causes the gallbladder to contract, which can result in pain.  Follow up as instructed. Attacks are almost always recurrent and surgery is usually required for permanent treatment. SEEK IMMEDIATE MEDICAL CARE IF:   Your pain increases and is not controlled by medications.  You have an oral temperature above 102 F (38.9 C), not controlled by medication.  You  develop nausea and vomiting. MAKE SURE YOU:   Understand these instructions.  Will watch your condition.  Will get help right away if you are not doing well or get worse. Document Released: 12/05/2005 Document Revised: 03/02/2012 Document Reviewed: 02/07/2011 [employer] Patient Information 2014 [employer].  

## 2013-06-22 NOTE — Progress Notes (Signed)
Patient ID: Ellen Hill, female   DOB: 12/04/1971, 42 y.o.   MRN: [id number]  No chief complaint on file.   HPI Ellen Hill is a 42 y.o. female.   HPI 42-year-old morbidly obese African American female referred by Dr. Dillard for evaluation of right upper quadrant pain. The patient states that she's had at least a 7 month history and probably longer time of intermittent right upper quadrant pain. She states last year was primarily spent dealing with uterine fibroids and polyps. She relates  That she will have episodes 3-4 times a week of right upper quadrant pain that radiates to her back. The pain will last hours. She denies any nausea vomiting fever or chills. She has a bowel movement about every other day. She denies any acholic stools. The pain generally occurs in the afternoon after eating a meal. She has noticed a correlation with ice cream. She denies any jaundice or NSAID use. She describes the pain as a sharp poking type pain. Other than taking pain medicine sheHas found nothing to ameliorate her abdominal pain. She denies any weight loss.  Past Medical History  Diagnosis Date  . Fibroids   . Irregular bleeding   . Abnormal Pap smear     history  . Heart murmur     dx child - no problems as adult  . Headache(784.0)     migraines   . Increased BMI   . ASCUS (atypical squamous cells of undetermined significance) on Pap smear 2008    At 36wk of pregnancy ; colpo  . 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 H/O candidiasis   . H/O varicella   . Hx: UTI (urinary tract infection) 07/29/07  . Anemia   . Postpartum anemia 08/18/07  . 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 H/O fatigue 08/2007  . Obesity 02/13/12  . Pelvic pain 02/13/12  . Asthma     Past Surgical History  Procedure Laterality Date  . Fibroidecto    . Fibroidectomy  07/04/2007  . Cesarean section  07/04/2007  . Cervical polypectomy  05/01/2012    Procedure: CERVICAL POLYPECTOMY;  Surgeon: Naima A Dillard, MD;  Location: [facility];   Service: Gynecology;  Laterality: N/A;  . Tubal ligation  07/04/2007  . Myomectomy  07/04/08  . Abdominal hysterectomy      Family History  Problem Relation Age of Onset  . Hypertension Mother   . Cancer Mother     Breast  . Hypertension Father   . Cancer Father     Prostate  . Hypertension Sister   . Diabetes Maternal Aunt   . Heart attack Father     Social History History  Substance Use Topics  . Smoking status: Former Smoker -- 0.30 packs/day for 1 years    Types: Cigarettes    Quit date: 03/18/1999  . Smokeless tobacco: Never Used  . Alcohol Use: No    No Known Allergies  Current Outpatient Prescriptions  Medication Sig Dispense Refill  . ibuprofen (ADVIL,MOTRIN) 200 MG tablet Take 200 mg by mouth every 6 (six) hours as needed for pain.       No current facility-administered medications for this visit.    Review of Systems Review of Systems  Constitutional: Negative for fever, chills and unexpected weight change.  HENT: Negative for hearing loss, congestion, sore throat, trouble swallowing and voice change.   Eyes: Negative for visual disturbance.  Respiratory: Positive for wheezing. Negative for cough.   Cardiovascular: Positive for leg swelling.   Negative for chest pain and palpitations.       Denies chest pain, shortness of breath, paroxysmal nocturnal dyspnea. States that she sleeps on 3 pillows primarily for comfort.  Gastrointestinal: Positive for abdominal pain and constipation. Negative for nausea, vomiting, diarrhea, blood in stool, abdominal distention and anal bleeding.  Genitourinary: Negative for hematuria, vaginal bleeding and difficulty urinating.       Had problem with uterine polyps and fibroids over the past year  Musculoskeletal: Negative for arthralgias.  Skin: Negative for rash and wound.  Neurological: Negative for seizures, syncope and headaches.  Hematological: Negative for adenopathy. Does not bruise/bleed easily.    Psychiatric/Behavioral: Negative for confusion.    Blood pressure 124/80, pulse 84, temperature 97.6 F (36.4 C), resp. rate 16, height 5' 6" (1.676 m), weight 383 lb 12.8 oz (174.091 kg).  Physical Exam Physical Exam  Vitals reviewed. Constitutional: She is oriented to person, place, and time. She appears well-developed and well-nourished. No distress.  Morbidly obese  HENT:  Head: Normocephalic and atraumatic.  Right Ear: External ear normal.  Left Ear: External ear normal.  Eyes: Conjunctivae are normal.  Neck: Normal range of motion. Neck supple. No tracheal deviation present. No thyromegaly present.  Cardiovascular: Normal rate, regular rhythm and normal heart sounds.   Pulmonary/Chest: Effort normal and breath sounds normal. No respiratory distress. She has no wheezes.  Abdominal: Soft. She exhibits no distension. There is tenderness in the right upper quadrant. There is no rebound and no guarding.    Very mild tenderness to palpation in right upper quadrant; has thickened skin around her umbilicus  Musculoskeletal: Normal range of motion. She exhibits no edema and no tenderness.  Lymphadenopathy:    She has no cervical adenopathy.  Neurological: She is alert and oriented to person, place, and time.  Skin: Skin is warm and dry. No rash noted. She is not diaphoretic. No erythema. No pallor.  Psychiatric: She has a normal mood and affect. Her behavior is normal. Judgment and thought content normal.    Data Reviewed Dr Dillard's note  Abd u/s -   COMPLETE ABDOMINAL ULTRASOUND  Comparison: None.  Findings:  Gallbladder: Multiple layering gallstones measuring up to 13 mm.  No gallbladder wall thickening or pericholecystic fluid. Negative  sonographic Murphy's sign.  Common bile duct: Measures 6 mm.  Liver: No focal lesion identified. Hyperechoic hepatic  parenchyma, suggesting hepatic steatosis.  IVC: Appears normal.  Pancreas: Poorly visualized due to overlying bowel  gas.  Spleen: Measures 11.1 cm.  Right Kidney: Measures 11.7 cm. No mass or hydronephrosis.  Left Kidney: Measures 12.2 cm. No mass or hydronephrosis.  Abdominal aorta: No aneurysm identified.   IMPRESSION:  Cholelithiasis, without associated sonographic findings to suggest  acute cholecystitis.  Suspected hepatic steatosis.  Assessment    Symptomatic cholelithiasis  Hepatic steatosis    Plan    I believe the patient's symptoms are consistent with gallbladder disease. However a small component of her symptoms could be due to hepatic steatosis. We discussed the importance of weight loss  We discussed gallbladder disease. The patient was given educational material. We discussed non-operative and operative management. We discussed the signs & symptoms of acute cholecystitis  I discussed laparoscopic cholecystectomy with IOC in detail.  The patient was given educational material as well as diagrams detailing the procedure.  We discussed the risks and benefits of a laparoscopic cholecystectomy including, but not limited to bleeding, infection, injury to surrounding structures such as the intestine or liver, bile leak,   retained gallstones, need to convert to an open procedure, prolonged diarrhea, blood clots such as  DVT, common bile duct injury, anesthesia risks, and possible need for additional procedures.  We discussed the typical post-operative recovery course. I explained that the likelihood of improvement of their symptoms is good.  Shantoya Geurts M. Skylen Spiering, MD, FACS General, Bariatric, & Minimally Invasive Surgery [facility]          Freddye Cardamone M 06/22/2013, 3:15 PM    

## 2013-07-01 NOTE — Pre-Procedure Instructions (Signed)
Ellen Hill  07/01/2013   Your procedure is scheduled on: Tuesday, July 06, 2013  Report to [facility] at 10:15 AM come to main entrance A.  Call this number if you have problems the morning of surgery: [phone]   Remember:   Do not eat food or drink liquids after midnight.   Take these medicines the morning of surgery with A SIP OF WATER:  Stop taking Aspirin, Coumadin, Plavix, Effient, and herbal medications. Do not take any NSAIDs ie: Ibuprofen, Advil, Naproxen or any medication containing Aspirin.  Do not wear jewelry, make-up or nail polish.  Do not wear lotions, powders, or perfume,deodorant.  Do not shave 48 hours prior to surgery.  Do not bring valuables to the hospital.  [facility] is not responsible  for any belongings or valuables.  Contacts, dentures or bridgework may not be worn into surgery.  Leave suitcase in the car. After surgery it may be brought to your room.  For patients admitted to the hospital, checkout time is 11:00 AM the day of discharge.   Patients discharged the day of surgery will not be allowed to drive home.  Name and phone number of your driver:   Special Instructions: Shower using CHG 2 nights before surgery and the night before surgery.  If you shower the day of surgery use CHG.  Use special wash - you have one bottle of CHG for all showers.  You should use approximately 1/3 of the bottle for each shower.   Please read over the following fact sheets that you were given: Pain Booklet, Coughing and Deep Breathing and Surgical Site Infection Prevention

## 2013-07-02 ENCOUNTER — Encounter (HOSPITAL_COMMUNITY)
Admission: RE | Admit: 2013-07-02 | Discharge: 2013-07-02 | Disposition: A | Discharge status: Home / Self Care | Payer: Medicaid Other | Source: Ambulatory Visit | Attending: General Surgery | Admitting: General Surgery

## 2013-07-02 ENCOUNTER — Encounter (HOSPITAL_COMMUNITY): Payer: Self-pay

## 2013-07-02 LAB — COMPREHENSIVE METABOLIC PANEL
ALT: 29 U/L (ref 0–35)
AST: 25 U/L (ref 0–37)
CO2: 26 mEq/L (ref 19–32)
Chloride: 104 mEq/L (ref 96–112)
GFR calc non Af Amer: >90 mL/min (ref >90)
Sodium: 139 mEq/L (ref 135–145)
Total Bilirubin: 0.4 mg/dL (ref 0.3–1.2)

## 2013-07-02 LAB — CBC WITH DIFFERENTIAL/PLATELET
Basophils Absolute: 0 10*3/uL (ref 0.0–0.1)
HCT: 36.3 % (ref 36.0–46.0)
Lymphocytes Relative: 22 % (ref 12–46)
Monocytes Absolute: 0.2 10*3/uL (ref 0.1–1.0)
Neutro Abs: 5.1 10*3/uL (ref 1.7–7.7)
Platelets: 204 10*3/uL (ref 150–400)
RBC: 4.99 MIL/uL (ref 3.87–5.11)
RDW: 16.5 % — ABNORMAL HIGH (ref 11.5–15.5)
WBC: 6.8 10*3/uL (ref 4.0–10.5)

## 2013-07-02 NOTE — Progress Notes (Addendum)
Primary Physician - Dr. Darcella Cheshire Does not have a cardiologist No prior cardiac testing  No period in over a year after hysteroscopy.

## 2013-07-05 MED ORDER — CEFOXITIN SODIUM 2 G IV SOLR
2.0000 g | INTRAVENOUS | Status: AC
  Administered 2013-07-06: 2 g via INTRAVENOUS
  Filled 2013-07-05: qty 2

## 2013-07-05 MED ORDER — CHLORHEXIDINE GLUCONATE 4 % EX LIQD: 1.0000 "application " | Freq: Once | CUTANEOUS | Status: DC

## 2013-07-06 ENCOUNTER — Observation Stay (HOSPITAL_COMMUNITY)
Admission: RE | Admit: 2013-07-06 | Discharge: 2013-07-07 | Disposition: A | Discharge status: Home / Self Care | Payer: Medicaid Other | Source: Ambulatory Visit | Attending: General Surgery | Admitting: General Surgery

## 2013-07-06 ENCOUNTER — Encounter (HOSPITAL_COMMUNITY): Payer: Self-pay | Admitting: *Deleted

## 2013-07-06 ENCOUNTER — Encounter (HOSPITAL_COMMUNITY)
Admission: RE | Disposition: A | Discharge status: Home / Self Care | Payer: Self-pay | Source: Ambulatory Visit | Attending: General Surgery

## 2013-07-06 ENCOUNTER — Encounter (HOSPITAL_COMMUNITY): Payer: Self-pay | Admitting: Certified Registered"

## 2013-07-06 ENCOUNTER — Ambulatory Visit (HOSPITAL_COMMUNITY): Payer: Medicaid Other | Admitting: Certified Registered"

## 2013-07-06 DIAGNOSIS — Z6841 Body Mass Index (BMI) 40.0 and over, adult: Secondary | ICD-10-CM | POA: Insufficient documentation

## 2013-07-06 DIAGNOSIS — K824 Cholesterolosis of gallbladder: Secondary | ICD-10-CM

## 2013-07-06 DIAGNOSIS — K7689 Other specified diseases of liver: Secondary | ICD-10-CM | POA: Insufficient documentation

## 2013-07-06 DIAGNOSIS — K8010 Calculus of gallbladder with chronic cholecystitis without obstruction: Principal | ICD-10-CM | POA: Insufficient documentation

## 2013-07-06 DIAGNOSIS — K8020 Calculus of gallbladder without cholecystitis without obstruction: Secondary | ICD-10-CM

## 2013-07-06 HISTORY — PX: CHOLECYSTECTOMY: SHX55

## 2013-07-06 SURGERY — LAPAROSCOPIC CHOLECYSTECTOMY WITH INTRAOPERATIVE CHOLANGIOGRAM
Anesthesia: General | Site: Abdomen | Wound class: Contaminated

## 2013-07-06 MED ORDER — ONDANSETRON HCL 4 MG/2ML IJ SOLN
INTRAMUSCULAR | Status: DC | PRN
  Administered 2013-07-06: 4 mg via INTRAVENOUS

## 2013-07-06 MED ORDER — HYDROMORPHONE HCL PF 1 MG/ML IJ SOLN
0.2500 mg | INTRAMUSCULAR | Status: DC | PRN
  Administered 2013-07-06 (×2): 0.5 mg via INTRAVENOUS

## 2013-07-06 MED ORDER — HYDROMORPHONE HCL PF 1 MG/ML IJ SOLN
INTRAMUSCULAR | Status: AC
  Filled 2013-07-06: qty 1

## 2013-07-06 MED ORDER — KCL IN DEXTROSE-NACL 20-5-0.45 MEQ/L-%-% IV SOLN
INTRAVENOUS | Status: DC
  Administered 2013-07-06 – 2013-07-07 (×2): via INTRAVENOUS
  Filled 2013-07-06 (×4): qty 1000

## 2013-07-06 MED ORDER — ENOXAPARIN SODIUM 40 MG/0.4ML ~~LOC~~ SOLN
40.0000 mg | SUBCUTANEOUS | Status: DC
  Filled 2013-07-06: qty 0.4

## 2013-07-06 MED ORDER — BUPIVACAINE-EPINEPHRINE PF 0.25-1:200000 % IJ SOLN
INTRAMUSCULAR | Status: AC
  Filled 2013-07-06: qty 30

## 2013-07-06 MED ORDER — OXYCODONE-ACETAMINOPHEN 5-325 MG PO TABS
1.0000 | ORAL_TABLET | ORAL | Status: DC | PRN
  Administered 2013-07-07 (×2): 2 via ORAL
  Filled 2013-07-06 (×2): qty 2

## 2013-07-06 MED ORDER — METOCLOPRAMIDE HCL 5 MG/ML IJ SOLN
INTRAMUSCULAR | Status: DC | PRN
  Administered 2013-07-06: 10 mg via INTRAVENOUS

## 2013-07-06 MED ORDER — ACETAMINOPHEN 325 MG PO TABS: 650.0000 mg | ORAL_TABLET | ORAL | Status: DC | PRN

## 2013-07-06 MED ORDER — ONDANSETRON HCL 4 MG/2ML IJ SOLN: 4.0000 mg | Freq: Four times a day (QID) | INTRAMUSCULAR | Status: DC | PRN

## 2013-07-06 MED ORDER — FENTANYL CITRATE 0.05 MG/ML IJ SOLN
INTRAMUSCULAR | Status: DC | PRN
  Administered 2013-07-06: 50 ug via INTRAVENOUS
  Administered 2013-07-06: 100 ug via INTRAVENOUS
  Administered 2013-07-06: 50 ug via INTRAVENOUS

## 2013-07-06 MED ORDER — SODIUM CHLORIDE 0.9 % IR SOLN
Status: DC | PRN
  Administered 2013-07-06: 1000 mL

## 2013-07-06 MED ORDER — METOCLOPRAMIDE HCL 5 MG/ML IJ SOLN
INTRAMUSCULAR | Status: AC
  Filled 2013-07-06: qty 2

## 2013-07-06 MED ORDER — MIDAZOLAM HCL 5 MG/5ML IJ SOLN
INTRAMUSCULAR | Status: DC | PRN
  Administered 2013-07-06: 2 mg via INTRAVENOUS

## 2013-07-06 MED ORDER — SUCCINYLCHOLINE CHLORIDE 20 MG/ML IJ SOLN
INTRAMUSCULAR | Status: DC | PRN
  Administered 2013-07-06: 100 mg via INTRAVENOUS

## 2013-07-06 MED ORDER — BUPIVACAINE-EPINEPHRINE 0.25% -1:200000 IJ SOLN
INTRAMUSCULAR | Status: DC | PRN
  Administered 2013-07-06: 20 mL

## 2013-07-06 MED ORDER — DEXAMETHASONE SODIUM PHOSPHATE 4 MG/ML IJ SOLN
INTRAMUSCULAR | Status: DC | PRN
  Administered 2013-07-06: 8 mg via INTRAVENOUS

## 2013-07-06 MED ORDER — LACTATED RINGERS IV SOLN
INTRAVENOUS | Status: DC
  Administered 2013-07-06 (×2): via INTRAVENOUS

## 2013-07-06 MED ORDER — ROCURONIUM BROMIDE 100 MG/10ML IV SOLN
INTRAVENOUS | Status: DC | PRN
  Administered 2013-07-06: 10 mg via INTRAVENOUS
  Administered 2013-07-06: 30 mg via INTRAVENOUS

## 2013-07-06 MED ORDER — SODIUM CHLORIDE 0.9 % IV SOLN
INTRAVENOUS | Status: DC | PRN
  Administered 2013-07-06: 14:00:00

## 2013-07-06 MED ORDER — NEOSTIGMINE METHYLSULFATE 1 MG/ML IJ SOLN
INTRAMUSCULAR | Status: DC | PRN
  Administered 2013-07-06: 3 mg via INTRAVENOUS

## 2013-07-06 MED ORDER — OXYCODONE HCL 5 MG/5ML PO SOLN: 5.0000 mg | Freq: Once | ORAL | Status: DC | PRN

## 2013-07-06 MED ORDER — OXYCODONE HCL 5 MG PO TABS: 5.0000 mg | ORAL_TABLET | Freq: Once | ORAL | Status: DC | PRN

## 2013-07-06 MED ORDER — MORPHINE SULFATE 2 MG/ML IJ SOLN
1.0000 mg | INTRAMUSCULAR | Status: DC | PRN
  Administered 2013-07-06 (×2): 2 mg via INTRAVENOUS
  Filled 2013-07-06 (×2): qty 1

## 2013-07-06 MED ORDER — METOCLOPRAMIDE HCL 5 MG/ML IJ SOLN
10.0000 mg | Freq: Once | INTRAMUSCULAR | Status: AC | PRN
  Administered 2013-07-06: 10 mg via INTRAVENOUS

## 2013-07-06 MED ORDER — ONDANSETRON HCL 4 MG PO TABS: 4.0000 mg | ORAL_TABLET | Freq: Four times a day (QID) | ORAL | Status: DC | PRN

## 2013-07-06 MED ORDER — GLYCOPYRROLATE 0.2 MG/ML IJ SOLN
INTRAMUSCULAR | Status: DC | PRN
  Administered 2013-07-06: 0.4 mg via INTRAVENOUS

## 2013-07-06 MED ORDER — PROPOFOL 10 MG/ML IV BOLUS
INTRAVENOUS | Status: DC | PRN
  Administered 2013-07-06: 200 mg via INTRAVENOUS

## 2013-07-06 MED ORDER — PANTOPRAZOLE SODIUM 40 MG IV SOLR
40.0000 mg | INTRAVENOUS | Status: DC
  Administered 2013-07-06: 40 mg via INTRAVENOUS
  Filled 2013-07-06 (×2): qty 40

## 2013-07-06 MED ORDER — LIDOCAINE HCL (CARDIAC) 20 MG/ML IV SOLN
INTRAVENOUS | Status: DC | PRN
  Administered 2013-07-06: 100 mg via INTRAVENOUS

## 2013-07-06 SURGICAL SUPPLY — 48 items
APL SKNCLS STERI-STRIP NONHPOA (GAUZE/BANDAGES/DRESSINGS) ×1
APPLIER CLIP 5 13 M/L LIGAMAX5 (MISCELLANEOUS) ×2
APR CLP MED LRG 5 ANG JAW (MISCELLANEOUS) ×1
BAG SPEC RTRVL LRG 6X4 10 (ENDOMECHANICALS) ×1
BANDAGE ADHESIVE 1X3 (GAUZE/BANDAGES/DRESSINGS) ×6 IMPLANT
BENZOIN TINCTURE PRP APPL 2/3 (GAUZE/BANDAGES/DRESSINGS) ×2 IMPLANT
BLADE SURG ROTATE 9660 (MISCELLANEOUS) IMPLANT
CANISTER SUCTION 2500CC (MISCELLANEOUS) ×2 IMPLANT
CHLORAPREP W/TINT 26ML (MISCELLANEOUS) ×2 IMPLANT
CLIP APPLIE 5 13 M/L LIGAMAX5 (MISCELLANEOUS) ×1 IMPLANT
CLOTH BEACON ORANGE TIMEOUT ST (SAFETY) ×2 IMPLANT
COVER MAYO STAND STRL (DRAPES) ×2 IMPLANT
COVER SURGICAL LIGHT HANDLE (MISCELLANEOUS) ×2 IMPLANT
DECANTER SPIKE VIAL GLASS SM (MISCELLANEOUS) ×2 IMPLANT
DEVICE TROCAR PUNCTURE CLOSURE (ENDOMECHANICALS) ×1 IMPLANT
DRAPE C-ARM 42X72 X-RAY (DRAPES) ×2 IMPLANT
DRAPE PROXIMA HALF (DRAPES) ×1 IMPLANT
DRAPE UTILITY 15X26 W/TAPE STR (DRAPE) ×4 IMPLANT
DRSG TEGADERM 4X4.75 (GAUZE/BANDAGES/DRESSINGS) ×2 IMPLANT
ELECT REM PT RETURN 9FT ADLT (ELECTROSURGICAL) ×2
ELECTRODE REM PT RTRN 9FT ADLT (ELECTROSURGICAL) ×1 IMPLANT
GAUZE SPONGE 2X2 8PLY STRL LF (GAUZE/BANDAGES/DRESSINGS) ×1 IMPLANT
GLOVE BIOGEL M STRL SZ7.5 (GLOVE) ×2 IMPLANT
GLOVE BIOGEL PI IND STRL 7.0 (GLOVE) IMPLANT
GLOVE BIOGEL PI IND STRL 8 (GLOVE) ×1 IMPLANT
GLOVE BIOGEL PI INDICATOR 7.0 (GLOVE) ×2
GLOVE BIOGEL PI INDICATOR 8 (GLOVE) ×1
GOWN STRL NON-REIN LRG LVL3 (GOWN DISPOSABLE) ×6 IMPLANT
GOWN STRL REIN XL XLG (GOWN DISPOSABLE) ×2 IMPLANT
KIT BASIN OR (CUSTOM PROCEDURE TRAY) ×2 IMPLANT
KIT ROOM TURNOVER OR (KITS) ×2 IMPLANT
NS IRRIG 1000ML POUR BTL (IV SOLUTION) ×2 IMPLANT
PAD ARMBOARD 7.5X6 YLW CONV (MISCELLANEOUS) ×2 IMPLANT
POUCH SPECIMEN RETRIEVAL 10MM (ENDOMECHANICALS) ×2 IMPLANT
SCISSORS LAP 5X35 DISP (ENDOMECHANICALS) IMPLANT
SET CHOLANGIOGRAPH 5 50 .035 (SET/KITS/TRAYS/PACK) ×2 IMPLANT
SET IRRIG TUBING LAPAROSCOPIC (IRRIGATION / IRRIGATOR) ×2 IMPLANT
SLEEVE ENDOPATH XCEL 5M (ENDOMECHANICALS) ×4 IMPLANT
SPECIMEN JAR SMALL (MISCELLANEOUS) ×2 IMPLANT
SPONGE GAUZE 2X2 STER 10/PKG (GAUZE/BANDAGES/DRESSINGS) ×1
SUT MNCRL AB 4-0 PS2 18 (SUTURE) ×2 IMPLANT
SUT VICRYL 0 UR6 27IN ABS (SUTURE) ×1 IMPLANT
TOWEL OR 17X24 6PK STRL BLUE (TOWEL DISPOSABLE) ×2 IMPLANT
TOWEL OR 17X26 10 PK STRL BLUE (TOWEL DISPOSABLE) ×2 IMPLANT
TRAY LAPAROSCOPIC (CUSTOM PROCEDURE TRAY) ×2 IMPLANT
TROCAR XCEL 12X100 BLDLESS (ENDOMECHANICALS) ×1 IMPLANT
TROCAR XCEL BLUNT TIP 100MML (ENDOMECHANICALS) ×2 IMPLANT
TROCAR XCEL NON-BLD 5MMX100MML (ENDOMECHANICALS) ×2 IMPLANT

## 2013-07-06 NOTE — Transfer of Care (Signed)
Immediate Anesthesia Transfer of Care Note  Patient: Ellen Hill  Procedure(s) Performed: Procedure(s) with comments: LAPAROSCOPIC CHOLECYSTECTOMY  (N/A) - attempted cholangiogram  Patient Location: PACU  Anesthesia Type:General  Level of Consciousness: awake, alert , oriented and patient cooperative  Airway & Oxygen Therapy: Patient Spontanous Breathing and Patient connected to face mask oxygen  Post-op Assessment: Report given to PACU RN, Post -op Vital signs reviewed and stable and Patient moving all extremities  Post vital signs: Reviewed and stable  Complications: No apparent anesthesia complications

## 2013-07-06 NOTE — Progress Notes (Signed)
Report given to angel rn as caregiver 

## 2013-07-06 NOTE — Anesthesia Postprocedure Evaluation (Signed)
Anesthesia Post Note  Patient: Ellen Hill  Procedure(s) Performed: Procedure(s) (LRB): LAPAROSCOPIC CHOLECYSTECTOMY  (N/A)  Anesthesia type: General  Patient location: PACU  Post pain: Pain level controlled  Post assessment: Patient's Cardiovascular Status Stable  Last Vitals:  Filed Vitals:   07/06/13 1430  BP: 114/58  Pulse: 70  Temp:   Resp: 20    Post vital signs: Reviewed and stable  Level of consciousness: alert  Complications: No apparent anesthesia complications

## 2013-07-06 NOTE — Preoperative (Signed)
Beta Blockers   Reason not to administer Beta Blockers:Not Applicable 

## 2013-07-06 NOTE — Anesthesia Procedure Notes (Signed)
Procedure Name: Intubation Date/Time: 07/06/2013 12:20 PM Performed by: Jerilee Hoh Pre-anesthesia Checklist: Patient identified, Emergency Drugs available, Suction available and Patient being monitored Patient Re-evaluated:Patient Re-evaluated prior to inductionOxygen Delivery Method: Circle system utilized Preoxygenation: Pre-oxygenation with 100% oxygen Intubation Type: IV induction Ventilation: Mask ventilation without difficulty Laryngoscope Size: Mac and 3 Grade View: Grade II Tube type: Oral Tube size: 7.5 mm Number of attempts: 1 Airway Equipment and Method: Stylet Placement Confirmation: ETT inserted through vocal cords under direct vision,  positive ETCO2 and breath sounds checked- equal and bilateral Secured at: 21 cm Tube secured with: Tape Dental Injury: Teeth and Oropharynx as per pre-operative assessment

## 2013-07-06 NOTE — Interval H&P Note (Signed)
History and Physical Interval Note:  07/06/2013 10:34 AM  Ellen Hill  has presented today for surgery, with the diagnosis of Symptomatic cholelithiasis  The various methods of treatment have been discussed with the patient and family. After consideration of risks, benefits and other options for treatment, the patient has consented to  Procedure(s): LAPAROSCOPIC CHOLECYSTECTOMY WITH INTRAOPERATIVE CHOLANGIOGRAM (N/A) as a surgical intervention .  The patient's history has been reviewed, patient examined, no change in status, stable for surgery.  I have reviewed the patient's chart and labs.  Questions were answered to the patient's satisfaction.    [name]. Andrey Campanile, MD, FACS General, Bariatric, & Minimally Invasive Surgery [facility], [place]   [facility] M

## 2013-07-06 NOTE — Anesthesia Preprocedure Evaluation (Signed)
Anesthesia Evaluation  Patient identified by MRN, date of birth, ID band Patient awake    Reviewed: Allergy & Precautions, H&P , NPO status , Patient's Chart, lab work & pertinent test results, reviewed documented beta blocker date and time   Airway Mallampati: II TM Distance: >3 FB Neck ROM: full    Dental   Pulmonary asthma ,  breath sounds clear to auscultation        Cardiovascular + Valvular Problems/Murmurs Rhythm:regular     Neuro/Psych  Headaches, PSYCHIATRIC DISORDERS    GI/Hepatic negative GI ROS, Neg liver ROS,   Endo/Other  Morbid obesity  Renal/GU negative Renal ROS  negative genitourinary   Musculoskeletal   Abdominal   Peds  Hematology  (+) anemia ,   Anesthesia Other Findings See surgeon's H&P   Reproductive/Obstetrics negative OB ROS                           Anesthesia Physical Anesthesia Plan  ASA: III  Anesthesia Plan: General   Post-op Pain Management:    Induction: Intravenous  Airway Management Planned: Oral ETT and Video Laryngoscope Planned  Additional Equipment:   Intra-op Plan:   Post-operative Plan: Extubation in OR  Informed Consent: I have reviewed the patients History and Physical, chart, labs and discussed the procedure including the risks, benefits and alternatives for the proposed anesthesia with the patient or authorized representative who has indicated his/her understanding and acceptance.   Dental Advisory Given  Plan Discussed with: CRNA and Surgeon  Anesthesia Plan Comments:         Anesthesia Quick Evaluation

## 2013-07-06 NOTE — Op Note (Signed)
Laparoscopic Cholecystectomy Procedure Note  Indications: This patient presents with symptomatic gallbladder disease and will undergo laparoscopic cholecystectomy.  Pre-operative Diagnosis: Calculus of gallbladder with other cholecystitis, without mention of obstruction; fatty liver  Post-operative Diagnosis: Same  Surgeon: [name]   Assistants: Ovidio Kin, MD FACS  Anesthesia: General endotracheal anesthesia  ASA Class: 3  Procedure Details  The patient was seen again in the Holding Room. The risks, benefits, complications, treatment options, and expected outcomes were discussed with the patient. The possibilities of reaction to medication, pulmonary aspiration, perforation of viscus, bleeding, recurrent infection, finding a normal gallbladder, the need for additional procedures, failure to diagnose a condition, the possible need to convert to an open procedure, and creating a complication requiring transfusion or operation were discussed with the patient. The likelihood of improving the patient's symptoms with return to their baseline status is good.  The patient and/or family concurred with the proposed plan, giving informed consent. The site of surgery properly noted. The patient was taken to Operating Room, identified as Ellen Hill and the procedure verified as Laparoscopic Cholecystectomy with Intraoperative Cholangiogram. A Time Out was held and the above information confirmed.  Prior to the induction of general anesthesia, antibiotic prophylaxis was administered. General endotracheal anesthesia was then administered and tolerated well. After the induction, the abdomen was prepped with Chloraprep and draped in the sterile fashion. The patient had thickened skin around from her umbilicus from probable chronic drainage from her umbilicus. This was also prepped with Betadine. The patient was positioned in the supine position.  Because of the skin changes at the umbilicus and her  morbid obesity, I elected to gain entry via an optiview technique in the LUQ just below the left subcostal ribcage. 1cm incision made and then 0 degree 5mm laparoscope was advanced thru a 5mm trocar was advanced thru all layers of the abdominal cavity and the scope was carefully advanced into the abdominal cavity.   Pneumoperitoneum was then created with CO2 and tolerated well without any adverse changes in the patient's vital signs. There were a few omental adhesions in the lower midline. An 12 mm trocar was placed supraumbilically.   Two 5-mm ports were placed in the right upper quadrant. All skin incisions were infiltrated with a local anesthetic agent before making the incision and placing the trocars.   We positioned the patient in reverse Trendelenburg, tilted slightly to the patient's left.  The patient had a very large fatty liver. The gallbladder was identified, the fundus grasped and retracted cephalad. Adhesions were lysed bluntly and with the electrocautery where indicated, taking care not to injure any adjacent organs or viscus. The infundibulum was grasped and retracted laterally, exposing the peritoneum overlying the triangle of Calot. This was then divided and exposed in a blunt fashion. A critical view of the cystic duct and cystic artery was obtained.  The cystic duct was clearly identified and bluntly dissected circumferentially. The cystic duct was ligated with a clip distally.   An incision was made in the cystic duct and the [facility] cholangiogram catheter introduced. I could not thread the catheter so I aborted the cholangiogram. There was flow of bile out of the cystic duct.   The cystic duct was then ligated with 3 clips on the biliary side and divided. The cystic artery (both an anterior and posterior branch) was identified, dissected free, ligated with clips and divided as well.   The gallbladder was dissected from the liver bed in retrograde fashion with the electrocautery.  The  gallbladder was removed and placed in an Endocatch sac.  The gallbladder and Endocatch sac were then removed through the umbilical port site by opening up the bag and removing the gallbladder and stones in pieces due to size and number of gallstones.  The liver bed was irrigated and inspected. Hemostasis was achieved with the electrocautery. Copious irrigation was utilized and was repeatedly aspirated until clear.  The umbilical fascia was closed using (2) interrupted 0-vicryl sutures using a suture passer under laparoscopic guidance.   We again inspected the right upper quadrant for hemostasis.  The umbilical closure was inspected and there was no air leak and nothing trapped within the closure. Pneumoperitoneum was released as we removed the trocars.  4-0 Monocryl was used to close the skin.   Benzoin, steri-strips, and clean dressings were applied. The patient was then extubated and brought to the recovery room in stable condition. Instrument, sponge, and needle counts were correct at closure and at the conclusion of the case.   Findings: Chronic cholecystitis; fatty liver  Estimated Blood Loss: Minimal         Drains: none         Specimens: Gallbladder           Complications: None; patient tolerated the procedure well.         Disposition: PACU - hemodynamically stable.         Condition: stable  [name]. Andrey Campanile, MD, FACS General, Bariatric, & Minimally Invasive Surgery [facility], [place]

## 2013-07-06 NOTE — H&P (View-Only) (Signed)
Patient ID: Ellen Hill, female   DOB: 1971-11-30, 42 y.o.   MRN: [id number]  No chief complaint on file.   HPI Ellen Hill is a 42 y.o. female.   HPI 42 year old morbidly obese African American female referred by Dr. Normand Sloop for evaluation of right upper quadrant pain. The patient states that she's had at least a 7 month history and probably longer time of intermittent right upper quadrant pain. She states last year was primarily spent dealing with uterine fibroids and polyps. She relates  That she will have episodes 3-4 times a week of right upper quadrant pain that radiates to her back. The pain will last hours. She denies any nausea vomiting fever or chills. She has a bowel movement about every other day. She denies any acholic stools. The pain generally occurs in the afternoon after eating a meal. She has noticed a correlation with ice cream. She denies any jaundice or NSAID use. She describes the pain as a sharp poking type pain. Other than taking pain medicine sheHas found nothing to ameliorate her abdominal pain. She denies any weight loss.  Past Medical History  Diagnosis Date  . Fibroids   . Irregular bleeding   . Abnormal Pap smear     history  . Heart murmur     dx child - no problems as adult  . Headache(784.0)     migraines   . Increased BMI   . ASCUS (atypical squamous cells of undetermined significance) on Pap smear 2008    At 36wk of pregnancy ; colpo  . 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 H/O candidiasis   . H/O varicella   . Hx: UTI (urinary tract infection) 07/29/07  . Anemia   . Postpartum anemia 08/18/07  . 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 H/O fatigue 08/2007  . Obesity 02/13/12  . Pelvic pain 02/13/12  . Asthma     Past Surgical History  Procedure Laterality Date  . Fibroidecto    . Fibroidectomy  07/04/2007  . Cesarean section  07/04/2007  . Cervical polypectomy  05/01/2012    Procedure: CERVICAL POLYPECTOMY;  Surgeon: Michael Litter, MD;  Location: [facility];   Service: Gynecology;  Laterality: N/A;  . Tubal ligation  07/04/2007  . Myomectomy  07/04/08  . Abdominal hysterectomy      Family History  Problem Relation Age of Onset  . Hypertension Mother   . Cancer Mother     Breast  . Hypertension Father   . Cancer Father     Prostate  . Hypertension Sister   . Diabetes Maternal Aunt   . Heart attack Father     Social History History  Substance Use Topics  . Smoking status: Former Smoker -- 0.30 packs/day for 1 years    Types: Cigarettes    Quit date: 03/18/1999  . Smokeless tobacco: Never Used  . Alcohol Use: No    No Known Allergies  Current Outpatient Prescriptions  Medication Sig Dispense Refill  . ibuprofen (ADVIL,MOTRIN) 200 MG tablet Take 200 mg by mouth every 6 (six) hours as needed for pain.       No current facility-administered medications for this visit.    Review of Systems Review of Systems  Constitutional: Negative for fever, chills and unexpected weight change.  HENT: Negative for hearing loss, congestion, sore throat, trouble swallowing and voice change.   Eyes: Negative for visual disturbance.  Respiratory: Positive for wheezing. Negative for cough.   Cardiovascular: Positive for leg swelling.  Negative for chest pain and palpitations.       Denies chest pain, shortness of breath, paroxysmal nocturnal dyspnea. States that she sleeps on 3 pillows primarily for comfort.  Gastrointestinal: Positive for abdominal pain and constipation. Negative for nausea, vomiting, diarrhea, blood in stool, abdominal distention and anal bleeding.  Genitourinary: Negative for hematuria, vaginal bleeding and difficulty urinating.       Had problem with uterine polyps and fibroids over the past year  Musculoskeletal: Negative for arthralgias.  Skin: Negative for rash and wound.  Neurological: Negative for seizures, syncope and headaches.  Hematological: Negative for adenopathy. Does not bruise/bleed easily.    Psychiatric/Behavioral: Negative for confusion.    Blood pressure 124/80, pulse 84, temperature 97.6 F (36.4 C), resp. rate 16, height 5\' 6"  (1.676 m), weight 383 lb 12.8 oz (174.091 kg).  Physical Exam Physical Exam  Vitals reviewed. Constitutional: She is oriented to person, place, and time. She appears well-developed and well-nourished. No distress.  Morbidly obese  HENT:  Head: Normocephalic and atraumatic.  Right Ear: External ear normal.  Left Ear: External ear normal.  Eyes: Conjunctivae are normal.  Neck: Normal range of motion. Neck supple. No tracheal deviation present. No thyromegaly present.  Cardiovascular: Normal rate, regular rhythm and normal heart sounds.   Pulmonary/Chest: Effort normal and breath sounds normal. No respiratory distress. She has no wheezes.  Abdominal: Soft. She exhibits no distension. There is tenderness in the right upper quadrant. There is no rebound and no guarding.    Very mild tenderness to palpation in right upper quadrant; has thickened skin around her umbilicus  Musculoskeletal: Normal range of motion. She exhibits no edema and no tenderness.  Lymphadenopathy:    She has no cervical adenopathy.  Neurological: She is alert and oriented to person, place, and time.  Skin: Skin is warm and dry. No rash noted. She is not diaphoretic. No erythema. No pallor.  Psychiatric: She has a normal mood and affect. Her behavior is normal. Judgment and thought content normal.    Data Reviewed Dr Redmond Baseman note  Abd u/s -   COMPLETE ABDOMINAL ULTRASOUND  Comparison: None.  Findings:  Gallbladder: Multiple layering gallstones measuring up to 13 mm.  No gallbladder wall thickening or pericholecystic fluid. Negative  sonographic Murphy's sign.  Common bile duct: Measures 6 mm.  Liver: No focal lesion identified. Hyperechoic hepatic  parenchyma, suggesting hepatic steatosis.  IVC: Appears normal.  Pancreas: Poorly visualized due to overlying bowel  gas.  Spleen: Measures 11.1 cm.  Right Kidney: Measures 11.7 cm. No mass or hydronephrosis.  Left Kidney: Measures 12.2 cm. No mass or hydronephrosis.  Abdominal aorta: No aneurysm identified.   IMPRESSION:  Cholelithiasis, without associated sonographic findings to suggest  acute cholecystitis.  Suspected hepatic steatosis.  Assessment    Symptomatic cholelithiasis  Hepatic steatosis    Plan    I believe the patient's symptoms are consistent with gallbladder disease. However a small component of her symptoms could be due to hepatic steatosis. We discussed the importance of weight loss  We discussed gallbladder disease. The patient was given Agricultural engineer. We discussed non-operative and operative management. We discussed the signs & symptoms of acute cholecystitis  I discussed laparoscopic cholecystectomy with IOC in detail.  The patient was given educational material as well as diagrams detailing the procedure.  We discussed the risks and benefits of a laparoscopic cholecystectomy including, but not limited to bleeding, infection, injury to surrounding structures such as the intestine or liver, bile leak,  retained gallstones, need to convert to an open procedure, prolonged diarrhea, blood clots such as  DVT, common bile duct injury, anesthesia risks, and possible need for additional procedures.  We discussed the typical post-operative recovery course. I explained that the likelihood of improvement of their symptoms is good.  [name]. Andrey Campanile, MD, FACS General, Bariatric, & Minimally Invasive Surgery [facility], [place]          [facility] M 06/22/2013, 3:15 PM

## 2013-07-07 MED ORDER — KETOROLAC TROMETHAMINE 30 MG/ML IJ SOLN
30.0000 mg | Freq: Once | INTRAMUSCULAR | Status: AC
Start: 2013-07-07 — End: 2013-07-07
  Administered 2013-07-07: 30 mg via INTRAVENOUS
  Filled 2013-07-07: qty 1

## 2013-07-07 MED ORDER — OXYCODONE-ACETAMINOPHEN 5-325 MG PO TABS: 1.0000 | ORAL_TABLET | ORAL | Status: DC | PRN

## 2013-07-07 NOTE — Progress Notes (Signed)
Patient discharged to home with instructions. 

## 2013-07-07 NOTE — Discharge Summary (Signed)
Physician Discharge Summary  Ellen Hill:[id number] DOB: 1971/05/25 DOA: 07/06/2013  PCP: Altamese Elgin, MD  Admit date: 07/06/2013 Discharge date: 07/07/2013  Recommendations for Outpatient Follow-up:  1.   Follow-up Information   Follow up with Ellen Ina, MD On 07/21/2013. (9:30 AM)    Contact information:   [address] [place] [address] [phone]      Discharge Diagnoses:  Symptomatic cholelithiasis Fatty liver  Surgical Procedure: Lap cholecystectomy 7/15  Discharge Condition: good Disposition: home  Diet recommendation: low fat  Filed Weights   07/06/13 1537  Weight: 386 lb 12.8 oz (175.451 kg)    Hospital Course:  Pt went to surgery for planned Lap cholecystectomy for symptomatic cholelithiasis. She was kept overnight for observation. She did well. On POD 1 her vitals were normal. She was tolerating liquids. Her pain was controlled. She was deemed stable for discharge.   BP 118/67  Pulse 82  Temp(Src) 98.3 F (36.8 C) (Oral)  Resp 17  Ht 5\' 6"  (1.676 m)  Wt 386 lb 12.8 oz (175.451 kg)  BMI 62.46 kg/m2  SpO2 100%  Gen: alert, NAD, non-toxic appearing Pupils: equal, no scleral icterus Pulm: Lungs clear to auscultation, symmetric chest rise CV: regular rate and rhythm Abd: soft, nontender, nondistended. Dressings c/d/i. No cellulitis.  Ext: no edema, Skin: no rash, no jaundice    Discharge Instructions      Discharge Orders   Future Appointments Provider Department Dept Phone   07/21/2013 9:30 AM Ellen Ina, MD [place] [facility] Surgery, [place] [phone]   Future Orders Complete By Expires     Increase activity slowly  As directed         Medication List         ibuprofen 200 MG tablet  Commonly known as:  ADVIL,MOTRIN  Take 200 mg by mouth every 6 (six) hours as needed for pain.     oxyCODONE-acetaminophen 5-325 MG per tablet  Commonly known as:  PERCOCET/ROXICET  Take 1-2 tablets by mouth every 4 (four)  hours as needed.     traMADol 50 MG tablet  Commonly known as:  ULTRAM  Take 50 mg by mouth 4 (four) times daily as needed for pain.       Follow-up Information   Follow up with Ellen Ina, MD On 07/21/2013. (9:30 AM)    Contact information:   [address] [place] [address] [phone]        The results of significant diagnostics from this hospitalization (including imaging, microbiology, ancillary and laboratory) are listed below for reference.    Significant Diagnostic Studies: No results found.  Microbiology: No results found for this or any previous visit (from the past 240 hour(s)).   Labs: Basic Metabolic Panel:  Recent Labs Lab 07/02/13 0859  NA 139  K 4.2  CL 104  CO2 26  GLUCOSE 113*  BUN 8  CREATININE 0.67  CALCIUM 9.6   Liver Function Tests:  Recent Labs Lab 07/02/13 0859  AST 25  ALT 29  ALKPHOS 86  BILITOT 0.4  PROT 7.9  ALBUMIN 3.7   No results found for this basename: LIPASE, AMYLASE,  in the last 168 hours No results found for this basename: AMMONIA,  in the last 168 hours CBC:  Recent Labs Lab 07/02/13 0859  WBC 6.8  NEUTROABS 5.1  HGB 11.6*  HCT 36.3  MCV 72.7*  PLT 204    Active Problems:   Obesity  Symptomatic cholelithiasis   Time coordinating discharge: 10 minutes   Signed:  [name], MD [facility], [place] [phone] 07/07/2013, 7:46 AM

## 2013-07-08 ENCOUNTER — Encounter (HOSPITAL_COMMUNITY): Payer: Self-pay | Admitting: General Surgery

## 2013-07-21 ENCOUNTER — Ambulatory Visit (INDEPENDENT_AMBULATORY_CARE_PROVIDER_SITE_OTHER): Payer: Medicaid Other | Admitting: General Surgery

## 2013-07-21 ENCOUNTER — Encounter (INDEPENDENT_AMBULATORY_CARE_PROVIDER_SITE_OTHER): Payer: Self-pay | Admitting: General Surgery

## 2013-07-21 VITALS — BP 128/78 | HR 68 | Temp 97.0°F | Resp 15 | Ht 66.0 in | Wt 375.8 lb

## 2013-07-21 DIAGNOSIS — L02212 Cutaneous abscess of back [any part, except buttock]: Secondary | ICD-10-CM

## 2013-07-21 DIAGNOSIS — L02219 Cutaneous abscess of trunk, unspecified: Secondary | ICD-10-CM

## 2013-07-21 DIAGNOSIS — Z09 Encounter for follow-up examination after completed treatment for conditions other than malignant neoplasm: Secondary | ICD-10-CM

## 2013-07-21 MED ORDER — OXYCODONE-ACETAMINOPHEN 5-325 MG PO TABS: 1.0000 | ORAL_TABLET | ORAL | Status: DC | PRN

## 2013-07-21 NOTE — Patient Instructions (Addendum)
Abscess Care After An abscess (also called a boil or furuncle) is an infected area that contains a collection of pus. Signs and symptoms of an abscess include pain, tenderness, redness, or hardness, or you may feel a moveable soft area under your skin. An abscess can occur anywhere in the body. The infection may spread to surrounding tissues causing cellulitis. A cut (incision) by the surgeon was made over your abscess and the pus was drained out. Gauze may have been packed into the space to provide a drain that will allow the cavity to heal from the inside outwards. The boil may be painful for 5 to 7 days. Most people with a boil do not have high fevers. Your abscess, if seen early, may not have localized, and may not have been lanced. If not, another appointment may be required for this if it does not get better on its own or with medications.  HOME CARE INSTRUCTIONS   Only take over-the-counter or prescription medicines for pain, discomfort, or fever as directed by your caregiver.   When you bathe Thursday, take a pain pill about 30 minutes prior to dressing change, soak and then remove gauze and then remove iodoform packing strip.  You may then wash the wound gently with mild soapy water. Cover with gauze and change daily.   SEEK IMMEDIATE MEDICAL CARE IF:   You develop increased pain, swelling, redness, drainage, or bleeding in the wound site.   You develop signs of generalized infection including muscle aches, chills, fever, or a general ill feeling.   An oral temperature above 102 F (38.9 C) develops, not controlled by medication.  See your caregiver for a recheck if you develop any of the symptoms described above. If medications (antibiotics) were prescribed, take them as directed.   Weight Loss Surgery   August 12th is next seminar Call [phone] to register

## 2013-07-21 NOTE — Progress Notes (Signed)
Subjective:     Patient ID: Ellen Hill, female   DOB: 03/22/1971, 42 y.o.   MRN: [id number]  HPI 42 year old morbidly obese African American female comes in for followup after undergoing laparoscopic cholecystectomy on July 15. She states that she's done well since surgery. She denies any nausea, vomiting, fever, chills, diarrhea or constipation. She reports a good appetite. She states that the symptoms she was having preoperatively have resolved. She still has a little bit of soreness around her umbilical incision. However she states that she has a lump on her back in the midportion that is quite sore and swollen and actually started draining some fluid the other day. She states the fluid was yellowish and blood-tinged and foul-smelling  Review of Systems     Objective:   Physical Exam BP 128/78  Pulse 68  Temp(Src) 97 F (36.1 C) (Temporal)  Resp 15  Ht 5\' 6"  (1.676 m)  Wt 375 lb 12.8 oz (170.462 kg)  BMI 60.68 kg/m2 Alert, no apparent distress Clear toAuscultation Regular rate and rhythm Obese, soft, nontender, well-healed trocar incisions. No cellulitis. No evidence of hernia Back-over the midportion in the midline in her thoracic spine there is soft tissue swelling with fluctuance and induration. It is mildly tender. There is no cellulitis    Assessment:     Status post laparoscopic cholecystectomy for chronic cholecystitis Back abscess     Plan:     We discussed her pathology report. She was given a copy of it. We discussed the importance of eating a well-balanced diet. With respect to her back abscess it does appear to be infected and needing incision and drainage. She was given Agricultural engineer.  After obtaining informed consent, the area was prepped with ChloraPrep. 3 cc of 1% Xylocaine mixed with sodium bicarbonate was infiltrated over the area of maximal induration. A 1 inch incision was made with a #15 blade. There is copious amounts of seropurulent fluid. The  cavity was packed with quarter-inch iodoform gauze followed by dry dressing. The patient tolerated the procedure well. She was given wound care instructions. Followup with me in 3-4 weeks for a wound check  She also inquired about surgical weight loss procedures. She was given information and registration number to attend our next surgical weight loss seminar  Ellen Hill. Ellen Campanile, MD, FACS General, Bariatric, & Minimally Invasive Surgery [facility] Surgery, [place]

## 2013-08-25 ENCOUNTER — Encounter (INDEPENDENT_AMBULATORY_CARE_PROVIDER_SITE_OTHER): Payer: Medicaid Other | Admitting: General Surgery

## 2013-10-21 ENCOUNTER — Other Ambulatory Visit: Payer: Self-pay | Admitting: Family Medicine

## 2013-10-21 DIAGNOSIS — Z1231 Encounter for screening mammogram for malignant neoplasm of breast: Secondary | ICD-10-CM

## 2013-10-28 ENCOUNTER — Other Ambulatory Visit: Payer: Self-pay

## 2013-11-25 ENCOUNTER — Ambulatory Visit
Admission: RE | Admit: 2013-11-25 | Discharge: 2013-11-25 | Disposition: A | Discharge status: Home / Self Care | Payer: Medicaid Other | Source: Ambulatory Visit | Attending: Family Medicine | Admitting: Family Medicine

## 2013-11-25 DIAGNOSIS — Z1231 Encounter for screening mammogram for malignant neoplasm of breast: Secondary | ICD-10-CM

## 2013-11-26 ENCOUNTER — Other Ambulatory Visit: Payer: Self-pay | Admitting: Family Medicine

## 2013-11-26 DIAGNOSIS — R928 Other abnormal and inconclusive findings on diagnostic imaging of breast: Secondary | ICD-10-CM

## 2013-12-01 ENCOUNTER — Ambulatory Visit
Admission: RE | Admit: 2013-12-01 | Discharge: 2013-12-01 | Disposition: A | Discharge status: Home / Self Care | Payer: Medicaid Other | Source: Ambulatory Visit | Attending: Family Medicine | Admitting: Family Medicine

## 2013-12-01 DIAGNOSIS — R928 Other abnormal and inconclusive findings on diagnostic imaging of breast: Secondary | ICD-10-CM

## 2014-06-20 ENCOUNTER — Other Ambulatory Visit: Payer: Self-pay | Admitting: Family Medicine

## 2014-06-20 DIAGNOSIS — N6310 Unspecified lump in the right breast, unspecified quadrant: Secondary | ICD-10-CM

## 2014-06-30 ENCOUNTER — Ambulatory Visit
Admission: RE | Admit: 2014-06-30 | Discharge: 2014-06-30 | Disposition: A | Discharge status: Home / Self Care | Payer: Medicaid Other | Source: Ambulatory Visit | Attending: Family Medicine | Admitting: Family Medicine

## 2014-06-30 DIAGNOSIS — N6310 Unspecified lump in the right breast, unspecified quadrant: Secondary | ICD-10-CM

## 2014-10-24 ENCOUNTER — Encounter (INDEPENDENT_AMBULATORY_CARE_PROVIDER_SITE_OTHER): Payer: Self-pay | Admitting: General Surgery

## 2014-10-29 ENCOUNTER — Encounter (HOSPITAL_COMMUNITY): Payer: Self-pay | Admitting: *Deleted

## 2014-10-29 ENCOUNTER — Emergency Department (HOSPITAL_COMMUNITY)
Admission: EM | Admit: 2014-10-29 | Discharge: 2014-10-29 | Disposition: A | Discharge status: Home / Self Care | Payer: Medicaid Other | Source: Home / Self Care | Attending: Family Medicine | Admitting: Family Medicine

## 2014-10-29 DIAGNOSIS — B86 Scabies: Secondary | ICD-10-CM

## 2014-10-29 MED ORDER — PERMETHRIN 5 % EX CREA: TOPICAL_CREAM | CUTANEOUS | Status: DC

## 2014-10-29 NOTE — ED Provider Notes (Signed)
Ellen Hill is a 43 y.o. female who presents to Urgent Care today for  Rash. Patient has had a pruritic rash present across her body present for several weeks. All of her family have a similar rash and suspect scabies. No treatment tried yet. No fevers or chills nausea vomiting or diarrhea. No new detergent soaps shampoos cosmetics or medications.   Past Medical History  Diagnosis Date  . Fibroids   . Irregular bleeding   . Abnormal Pap smear     history  . Heart murmur     dx child - no problems as adult  . Headache(784.0)     migraines   . Increased BMI   . ASCUS (atypical squamous cells of undetermined significance) on Pap smear 2008    At 36wk of pregnancy ; colpo  . 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 H/O candidiasis   . H/O varicella   . Hx: UTI (urinary tract infection) 07/29/07  . Anemia   . Postpartum anemia 08/18/07  . 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 H/O fatigue 08/2007  . Obesity 02/13/12  . Pelvic pain 02/13/12  . Asthma     rarely uses inhaler (seasonal)   Past Surgical History  Procedure Laterality Date  . Fibroidecto    . Fibroidectomy  07/04/2007  . Cesarean section  07/04/2007  . Cervical polypectomy  05/01/2012    Procedure: CERVICAL POLYPECTOMY;  Surgeon: Betsy Coder, MD;  Location: [facility];  Service: Gynecology;  Laterality: N/A;  . Tubal ligation  07/04/2007  . Myomectomy  07/04/08  . Operative hysteroscopy  05/01/2012    no menses since that time  . Cholecystectomy  07/06/2013  . Cholecystectomy N/A 07/06/2013    Procedure: LAPAROSCOPIC CHOLECYSTECTOMY ;  Surgeon: Gayland Curry, MD;  Location: [facility];  Service: General;  Laterality: N/A;  attempted cholangiogram   History  Substance Use Topics  . Smoking status: Former Smoker -- 0.30 packs/day for 1 years    Types: Cigarettes    Quit date: 03/18/1999  . Smokeless tobacco: Never Used  . Alcohol Use: Yes     Comment: social   ROS as above Medications: No current facility-administered medications for  this encounter.   Current Outpatient Prescriptions  Medication Sig Dispense Refill  . ibuprofen (ADVIL,MOTRIN) 200 MG tablet Take 200 mg by mouth every 6 (six) hours as needed for pain.    Marland Kitchen oxyCODONE-acetaminophen (PERCOCET/ROXICET) 5-325 MG per tablet Take 1-2 tablets by mouth every 4 (four) hours as needed. 20 tablet 0  . permethrin (ELIMITE) 5 % cream Apply from the neck down at night and wash off in the morning once 180 g 1   No Known Allergies   Exam:  BP 130/60 mmHg  Pulse 82  Temp(Src) 98.1 F (36.7 C) (Oral)  Resp 16  SpO2 99% Gen: Well NAD morbid obesity Skin: several small erythematous excoriated pruritic papules scattered across body  No results found for this or any previous visit (from the past 24 hour(s)). No results found.  Assessment and Plan: 43 y.o. female with scabies. Treatment with permethrin  Discussed warning signs or symptoms. Please see discharge instructions. Patient expresses understanding.     Gregor Hams, MD 10/29/14 1159

## 2014-10-29 NOTE — Discharge Instructions (Signed)
Thank you for coming in today. °Apply the permethrin cream from the neck down at night and wash off in the morning. °Use Benadryl at night as needed for itching. °Use Gold Bond Itch as needed. °Come back if not getting better or worsening. ° ° °Scabies °Scabies are small bugs (mites) that burrow under the skin and cause red bumps and severe itching. These bugs can only be seen with a microscope. Scabies are highly contagious. They can spread easily from person to person by direct contact. They are also spread through sharing clothing or linens that have the scabies mites living in them. It is not unusual for an entire family to become infected through shared towels, clothing, or bedding.  °HOME CARE INSTRUCTIONS  °· Your caregiver may prescribe a cream or lotion to kill the mites. If cream is prescribed, massage the cream into the entire body from the neck to the bottom of both feet. Also massage the cream into the scalp and face if your child is less than 1 year old. Avoid the eyes and mouth. Do not wash your hands after application. °· Leave the cream on for 8 to 12 hours. Your child should bathe or shower after the 8 to 12 hour application period. Sometimes it is helpful to apply the cream to your child right before bedtime. °· One treatment is usually effective and will eliminate approximately 95% of infestations. For severe cases, your caregiver may decide to repeat the treatment in 1 week. Everyone in your household should be treated with one application of the cream. °· New rashes or burrows should not appear within 24 to 48 hours after successful treatment. However, the itching and rash may last for 2 to 4 weeks after successful treatment. Your caregiver may prescribe a medicine to help with the itching or to help the rash go away more quickly. °· Scabies can live on clothing or linens for up to 3 days. All of your child's recently used clothing, towels, stuffed toys, and bed linens should be washed in hot  water and then dried in a dryer for at least 20 minutes on high heat. Items that cannot be washed should be enclosed in a plastic bag for at least 3 days. °· To help relieve itching, bathe your child in a cool bath or apply cool washcloths to the affected areas. °· Your child may return to school after treatment with the prescribed cream. °SEEK MEDICAL CARE IF:  °· The itching persists longer than 4 weeks after treatment. °· The rash spreads or becomes infected. Signs of infection include red blisters or yellow-tan crust. °Document Released: 12/09/2005 Document Revised: 03/02/2012 Document Reviewed: 04/19/2009 °ExitCare® Patient Information ©2015 [employer]. This information is not intended to replace advice given to you by your health care provider. Make sure you discuss any questions you have with your health care provider. ° °

## 2014-10-29 NOTE — ED Notes (Signed)
Pt  Reports  She has  Symptoms  Of  A  Rash  That  Itches   - is  Fine  In nature      Symptoms  X  2  Weeks     No  Angioedema     Appears  In no  Severe  Distress         Family   Members  Has  Similar  Symptoms

## 2015-01-25 ENCOUNTER — Other Ambulatory Visit: Payer: Self-pay

## 2015-01-25 DIAGNOSIS — Z1231 Encounter for screening mammogram for malignant neoplasm of breast: Secondary | ICD-10-CM

## 2015-01-27 ENCOUNTER — Ambulatory Visit
Admission: RE | Admit: 2015-01-27 | Discharge: 2015-01-27 | Disposition: A | Discharge status: Home / Self Care | Payer: Medicaid Other | Source: Ambulatory Visit

## 2015-01-27 DIAGNOSIS — Z1231 Encounter for screening mammogram for malignant neoplasm of breast: Secondary | ICD-10-CM

## 2017-04-02 ENCOUNTER — Inpatient Hospital Stay (HOSPITAL_COMMUNITY)
Admission: AD | Admit: 2017-04-02 | Discharge: 2017-04-02 | Disposition: A | Discharge status: Home / Self Care | Payer: Medicaid Other | Source: Ambulatory Visit | Attending: Family Medicine | Admitting: Family Medicine

## 2017-04-02 ENCOUNTER — Encounter (HOSPITAL_COMMUNITY): Payer: Self-pay | Admitting: *Deleted

## 2017-04-02 DIAGNOSIS — N939 Abnormal uterine and vaginal bleeding, unspecified: Secondary | ICD-10-CM | POA: Insufficient documentation

## 2017-04-02 DIAGNOSIS — D649 Anemia, unspecified: Secondary | ICD-10-CM | POA: Insufficient documentation

## 2017-04-02 DIAGNOSIS — Z6841 Body Mass Index (BMI) 40.0 and over, adult: Secondary | ICD-10-CM | POA: Insufficient documentation

## 2017-04-02 DIAGNOSIS — J45909 Unspecified asthma, uncomplicated: Secondary | ICD-10-CM | POA: Insufficient documentation

## 2017-04-02 DIAGNOSIS — Z87891 Personal history of nicotine dependence: Secondary | ICD-10-CM | POA: Insufficient documentation

## 2017-04-02 DIAGNOSIS — E669 Obesity, unspecified: Secondary | ICD-10-CM | POA: Insufficient documentation

## 2017-04-02 HISTORY — DX: Unspecified abnormal cytological findings in specimens from vagina: R87.629

## 2017-04-02 LAB — URINALYSIS, ROUTINE W REFLEX MICROSCOPIC
Bilirubin Urine: NEGATIVE
Glucose, UA: NEGATIVE mg/dL
KETONES UR: NEGATIVE mg/dL
LEUKOCYTES UA: NEGATIVE
NITRITE: NEGATIVE
PH: 6 (ref 5.0–8.0)
PROTEIN: 30 mg/dL — AB
SPECIFIC GRAVITY, URINE: 1.013 (ref 1.005–1.030)

## 2017-04-02 LAB — POCT PREGNANCY, URINE: Preg Test, Ur: NEGATIVE

## 2017-04-02 LAB — CBC
HCT: 35.5 % — ABNORMAL LOW (ref 36.0–46.0)
Hemoglobin: 11 g/dL — ABNORMAL LOW (ref 12.0–15.0)
MCH: 23.3 pg — AB (ref 26.0–34.0)
MCHC: 31 g/dL (ref 30.0–36.0)
MCV: 75.2 fL — AB (ref 78.0–100.0)
PLATELETS: 227 10*3/uL (ref 150–400)
RBC: 4.72 MIL/uL (ref 3.87–5.11)
RDW: 16.8 % — ABNORMAL HIGH (ref 11.5–15.5)
WBC: 5.7 10*3/uL (ref 4.0–10.5)

## 2017-04-02 MED ORDER — MEGESTROL ACETATE 20 MG PO TABS: 60.0000 mg | ORAL_TABLET | Freq: Two times a day (BID) | ORAL | 0 refills | Status: DC

## 2017-04-02 MED ORDER — IBUPROFEN 600 MG PO TABS: 600.0000 mg | ORAL_TABLET | Freq: Four times a day (QID) | ORAL | 0 refills | Status: DC | PRN

## 2017-04-02 MED ORDER — KETOROLAC TROMETHAMINE 60 MG/2ML IM SOLN
60.0000 mg | Freq: Once | INTRAMUSCULAR | Status: AC
  Administered 2017-04-02: 60 mg via INTRAMUSCULAR
  Filled 2017-04-02: qty 2

## 2017-04-02 MED ORDER — TRAMADOL HCL 50 MG PO TABS: 50.0000 mg | ORAL_TABLET | Freq: Four times a day (QID) | ORAL | 0 refills | Status: DC | PRN

## 2017-04-02 NOTE — MAU Note (Addendum)
Pt C/O lower & mid abd pain for the last week.  Started bleeding heavily on Saturday, is worse today, passing clots.  Pt states she has been bleeding for the last 8-9 months.

## 2017-04-02 NOTE — MAU Provider Note (Signed)
History     CSN: [id number]  Arrival date and time: 04/02/17 1258   First Provider Initiated Contact with Patient 04/02/17 1658      Chief Complaint  Patient presents with  . Abdominal Pain  . Vaginal Bleeding   HPI   Ms.Ellen Hill is a 46 y.o. female [phone] non pregnant, with a history of abnormal uterine bleeding and anemia, here in MAU with abdominal pain and vaginal bleeding.  She is without GYN care at this time.  Her last visit was with [facility] and she underwent a hysteroscopy in 2015 due to polyps. She had several polyps removed and her bleeding improved. She recently saw her primary care Dr on 3/28 and was given medication (provera) "to stop bleeding" she took that for 10 days and says it did nothing. She has continued to have heavy bleeding, passing clots for more than 2 days, changing her pad every hour. The bleeding has been off and on for 9 months, however heavier in the last 48 hours.  She currently denies dizziness.   OB History    Gravida Para Term Preterm AB Living   4 1 0 1 3 1    SAB TAB Ectopic Multiple Live Births   0 3 0 0 1      Past Medical History:  Diagnosis Date  . Abnormal Pap smear    history  . Anemia   . ASCUS (atypical squamous cells of undetermined significance) on Pap smear 2008   At 36wk of pregnancy ; colpo  . Asthma    rarely uses inhaler (seasonal)  . 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 Fibroids   . H/O candidiasis   . H/O fatigue 08/2007  . H/O varicella   . Headache(784.0)    migraines   . Heart murmur    dx child - no problems as adult  . Hx: UTI (urinary tract infection) 07/29/07  . Increased BMI   . Irregular bleeding   . Obesity 02/13/12  . Pelvic pain 02/13/12  . Postpartum anemia 08/18/07  . 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 after loss of parents, is fine  . Vaginal Pap smear, abnormal    f/u wnl    Past Surgical History:  Procedure Laterality Date  . CERVICAL POLYPECTOMY  05/01/2012   Procedure: CERVICAL POLYPECTOMY;   Surgeon: Betsy Coder, MD;  Location: [facility];  Service: Gynecology;  Laterality: N/A;  . CESAREAN SECTION  07/04/2007  . CHOLECYSTECTOMY  07/06/2013  . CHOLECYSTECTOMY N/A 07/06/2013   Procedure: LAPAROSCOPIC CHOLECYSTECTOMY ;  Surgeon: Gayland Curry, MD;  Location: [facility];  Service: General;  Laterality: N/A;  attempted cholangiogram  . fibroidecto    . fibroidectomy  07/04/2007  . MYOMECTOMY  07/04/08  . OPERATIVE HYSTEROSCOPY  05/01/2012   no menses since that time  . TUBAL LIGATION  07/04/2007    Family History  Problem Relation Age of Onset  . Hypertension Mother   . Cancer Mother     Breast  . Hypertension Father   . Cancer Father     Prostate  . Heart attack Father   . Hypertension Sister   . Diabetes Maternal Aunt     Social History  Substance Use Topics  . Smoking status: Former Smoker    Packs/day: 0.30    Years: 1.00    Types: Cigarettes    Quit date: 03/18/1999  . Smokeless tobacco: Never Used  . Alcohol use No    Allergies: No Known Allergies  Prescriptions Prior to Admission  Medication Sig Dispense Refill Last Dose  . ibuprofen (ADVIL,MOTRIN) 200 MG tablet Take 200 mg by mouth every 6 (six) hours as needed for pain.   Taking  . oxyCODONE-acetaminophen (PERCOCET/ROXICET) 5-325 MG per tablet Take 1-2 tablets by mouth every 4 (four) hours as needed. 20 tablet 0   . permethrin (ELIMITE) 5 % cream Apply from the neck down at night and wash off in the morning once 180 g 1    Results for orders placed or performed during the hospital encounter of 04/02/17 (from the past 48 hour(s))  Urinalysis, Routine w reflex microscopic     Status: Abnormal   Collection Time: 04/02/17  1:37 PM  Result Value Ref Range   Color, Urine YELLOW YELLOW   APPearance HAZY (A) CLEAR   Specific Gravity, Urine 1.013 1.005 - 1.030   pH 6.0 5.0 - 8.0   Glucose, UA NEGATIVE NEGATIVE mg/dL   Hgb urine dipstick LARGE (A) NEGATIVE   Bilirubin Urine NEGATIVE NEGATIVE   Ketones, ur NEGATIVE  NEGATIVE mg/dL   Protein, ur 30 (A) NEGATIVE mg/dL   Nitrite NEGATIVE NEGATIVE   Leukocytes, UA NEGATIVE NEGATIVE   RBC / HPF TOO NUMEROUS TO COUNT 0 - 5 RBC/hpf   WBC, UA 6-30 0 - 5 WBC/hpf   Bacteria, UA RARE (A) NONE SEEN   Squamous Epithelial / LPF 0-5 (A) NONE SEEN   Mucous PRESENT   Pregnancy, urine POC     Status: None   Collection Time: 04/02/17  1:46 PM  Result Value Ref Range   Preg Test, Ur NEGATIVE NEGATIVE    Comment:        THE SENSITIVITY OF THIS METHODOLOGY IS >24 mIU/mL   CBC     Status: Abnormal   Collection Time: 04/02/17  3:25 PM  Result Value Ref Range   WBC 5.7 4.0 - 10.5 K/uL   RBC 4.72 3.87 - 5.11 MIL/uL   Hemoglobin 11.0 (L) 12.0 - 15.0 g/dL   HCT 35.5 (L) 36.0 - 46.0 %   MCV 75.2 (L) 78.0 - 100.0 fL   MCH 23.3 (L) 26.0 - 34.0 pg   MCHC 31.0 30.0 - 36.0 g/dL   RDW 16.8 (H) 11.5 - 15.5 %   Platelets 227 150 - 400 K/uL   Review of Systems  Constitutional: Positive for fatigue.  Neurological: Positive for weakness and headaches. Negative for dizziness.   Physical Exam   Blood pressure (!) 157/96, pulse (!) 111, temperature 98.3 F (36.8 C), temperature source Oral, resp. rate 18, height 5\' 6"  (1.676 m), weight (!) 397 lb (180.1 kg).  Physical Exam  Constitutional: She is oriented to person, place, and time. She appears well-developed and well-nourished. No distress.  Morbid obese   HENT:  Head: Normocephalic.  Eyes: Pupils are equal, round, and reactive to light.  Genitourinary:  Genitourinary Comments: Vagina - Small- moderate amount of blood in the vaginal canal. Cervix - Difficult to exam due to body habitus.  Bimanual exam: Cervix closed Uterus non tender, normal size Chaperone present for exam.   Musculoskeletal: Normal range of motion.  Neurological: She is alert and oriented to person, place, and time.  Skin: Skin is warm. She is not diaphoretic.  Psychiatric: Her behavior is normal.   MAU Course  Procedures  None  MDM  UA   CBC: hgb stable  Toradol 60 mg IM: Patient rates her pain 0/10  Assessment and Plan   A:  1. Abnormal uterine  bleeding (AUB)     P:  Discharge home in stable condition Rx: Ultram, Megace Outpatient [place] ordered Message to [facility] for follow up appointment following [place] Return to MAU if symptoms worsen  Bleeding precautions   Lezlie Lye, NP 04/02/2017 6:01 PM

## 2017-04-02 NOTE — MAU Note (Signed)
For the last 9/10 months has been having on going bleeding, though has been light, (one pad/tampon a day). Became heavy on Sunday.  Is now changing every 60-90 min, soaking  Super plus tampons and pad.

## 2017-04-09 ENCOUNTER — Telehealth: Payer: Self-pay | Admitting: *Deleted

## 2017-04-09 NOTE — Telephone Encounter (Signed)
Called pt and informed her of appts which have been scheduled.  She will have pelvic US and transvaginal US on 4/25 @ 1000 and will need to have a full bladder for the exam - instructions given. Her appt in the office will be 5/22 @ 3pm.  Pt voiced understanding and had no questions. She stated she has been taking the Megace as prescribed and her bleeding has improved - now only light bleeding.

## 2017-04-16 ENCOUNTER — Ambulatory Visit (HOSPITAL_COMMUNITY)
Admission: RE | Admit: 2017-04-16 | Discharge: 2017-04-16 | Disposition: A | Discharge status: Home / Self Care | Payer: Self-pay | Source: Ambulatory Visit | Attending: Obstetrics and Gynecology | Admitting: Obstetrics and Gynecology

## 2017-04-16 DIAGNOSIS — I10 Essential (primary) hypertension: Secondary | ICD-10-CM | POA: Insufficient documentation

## 2017-04-16 DIAGNOSIS — R102 Pelvic and perineal pain: Secondary | ICD-10-CM | POA: Insufficient documentation

## 2017-04-16 DIAGNOSIS — D250 Submucous leiomyoma of uterus: Secondary | ICD-10-CM | POA: Insufficient documentation

## 2017-04-16 DIAGNOSIS — E119 Type 2 diabetes mellitus without complications: Secondary | ICD-10-CM | POA: Insufficient documentation

## 2017-04-16 DIAGNOSIS — N939 Abnormal uterine and vaginal bleeding, unspecified: Secondary | ICD-10-CM | POA: Insufficient documentation

## 2017-05-13 ENCOUNTER — Encounter: Payer: Self-pay | Admitting: Obstetrics & Gynecology

## 2017-05-13 ENCOUNTER — Ambulatory Visit (INDEPENDENT_AMBULATORY_CARE_PROVIDER_SITE_OTHER): Payer: Self-pay | Admitting: Clinical

## 2017-05-13 ENCOUNTER — Ambulatory Visit (INDEPENDENT_AMBULATORY_CARE_PROVIDER_SITE_OTHER): Payer: Self-pay | Admitting: Obstetrics & Gynecology

## 2017-05-13 ENCOUNTER — Other Ambulatory Visit (HOSPITAL_COMMUNITY)
Admission: RE | Admit: 2017-05-13 | Discharge: 2017-05-13 | Disposition: A | Discharge status: Home / Self Care | Payer: Self-pay | Source: Ambulatory Visit | Attending: Obstetrics & Gynecology | Admitting: Obstetrics & Gynecology

## 2017-05-13 ENCOUNTER — Other Ambulatory Visit (HOSPITAL_COMMUNITY)
Admission: RE | Admit: 2017-05-13 | Discharge: 2017-05-13 | Disposition: A | Discharge status: Home / Self Care | Payer: Medicaid Other | Source: Ambulatory Visit | Attending: Obstetrics & Gynecology | Admitting: Obstetrics & Gynecology

## 2017-05-13 VITALS — BP 168/96 | HR 102 | Wt 378.3 lb

## 2017-05-13 DIAGNOSIS — Z01419 Encounter for gynecological examination (general) (routine) without abnormal findings: Secondary | ICD-10-CM | POA: Insufficient documentation

## 2017-05-13 DIAGNOSIS — N939 Abnormal uterine and vaginal bleeding, unspecified: Secondary | ICD-10-CM | POA: Insufficient documentation

## 2017-05-13 DIAGNOSIS — F4323 Adjustment disorder with mixed anxiety and depressed mood: Secondary | ICD-10-CM

## 2017-05-13 DIAGNOSIS — Z1151 Encounter for screening for human papillomavirus (HPV): Secondary | ICD-10-CM

## 2017-05-13 DIAGNOSIS — N946 Dysmenorrhea, unspecified: Secondary | ICD-10-CM

## 2017-05-13 DIAGNOSIS — Z113 Encounter for screening for infections with a predominantly sexual mode of transmission: Secondary | ICD-10-CM

## 2017-05-13 DIAGNOSIS — D251 Intramural leiomyoma of uterus: Secondary | ICD-10-CM

## 2017-05-13 DIAGNOSIS — Z124 Encounter for screening for malignant neoplasm of cervix: Secondary | ICD-10-CM

## 2017-05-13 DIAGNOSIS — D250 Submucous leiomyoma of uterus: Secondary | ICD-10-CM

## 2017-05-13 DIAGNOSIS — Z3202 Encounter for pregnancy test, result negative: Secondary | ICD-10-CM

## 2017-05-13 DIAGNOSIS — D252 Subserosal leiomyoma of uterus: Secondary | ICD-10-CM | POA: Insufficient documentation

## 2017-05-13 DIAGNOSIS — R030 Elevated blood-pressure reading, without diagnosis of hypertension: Secondary | ICD-10-CM

## 2017-05-13 LAB — POCT PREGNANCY, URINE: PREG TEST UR: NEGATIVE

## 2017-05-13 MED ORDER — MEGESTROL ACETATE 40 MG PO TABS: 40.0000 mg | ORAL_TABLET | Freq: Two times a day (BID) | ORAL | 5 refills | Status: DC

## 2017-05-13 NOTE — Progress Notes (Signed)
History:  46 y.o. [id number] here today for AUB. Pt is s/p c/section x1.   She had a hysteroscopy with [facility] for endometrial polyps in 2015. Pt with a h/o uterine fiborids.. She is s/p a myomectomy prev at the time of her c/s. She had a BTL at that time.  She was started on Megace in the MAU but, she ran out. The bleeding was stopped on the Megace but, she ran out.     The following portions of the patient's history were reviewed and updated as appropriate: allergies, current medications, past family history, past medical history, past social history, past surgical history and problem list.  Review of Systems:  Pertinent items are noted in HPI.   Objective:  Physical Exam Blood pressure (!) 168/96, pulse (!) 102, weight (!) 378 lb 4.8 oz (171.6 kg).  CONSTITUTIONAL: Well-developed, well-nourished female in no acute distress.  HENT:  Normocephalic, atraumatic EYES: Conjunctivae and EOM are normal. No scleral icterus.  NECK: Normal range of motion SKIN: Skin is warm and dry. No rash noted. Not diaphoretic.No pallor. [place]: Alert and oriented to person, place, and time. Normal coordination.  Abd: obese Pelvic: Normal appearing external genitalia; normal appearing vaginal mucosa and cervix.  lots of blood in vault. Adnexa and uterine size difficult to assess due to body habitus.  The indications for endometrial biopsy were reviewed.   Risks of the biopsy including cramping, bleeding, infection, uterine perforation, inadequate specimen and need for additional procedures  were discussed. The patient states she understands and agrees to undergo procedure today. Consent was signed. Time out was performed. Urine HCG was negative. A sterile speculum was placed in the patient's vagina and the cervix was prepped with Betadine. A single-toothed tenaculum was placed on the anterior lip of the cervix to stabilize it. The 3 mm pipelle was introduced into the endometrial cavity without difficulty to a depth of  9cm, and a moderate amount of tissue was obtained and sent to pathology. The instruments were removed from the patient's vagina. Minimal bleeding from the cervix was noted. The patient tolerated the procedure well.  Labs and Imaging US Transvaginal Non-ob  Result Date: 04/16/2017 CLINICAL DATA:  Abnormal uterine bleeding for 6-8 months, ranges from light to heavy, associated pelvic pain with heavy bleeding, history hypertension, diabetes mellitus EXAM: TRANSABDOMINAL AND TRANSVAGINAL ULTRASOUND OF PELVIS TECHNIQUE: Both transabdominal and transvaginal ultrasound examinations of the pelvis were performed. Transabdominal technique was performed for global imaging of the pelvis including uterus, ovaries, adnexal regions, and pelvic cul-de-sac. It was necessary to proceed with endovaginal exam following the transabdominal exam to visualize the uterus and ovaries. COMPARISON:  03/07/2012 FINDINGS: Uterus Measurements: 11.0 x 6.3 x 6.7 cm. Mildly heterogeneous myometrium. Multiple focal areas of abnormal echogenicity seen likely representing leiomyomata, largest posterior mid uterus 3.1 x 2.9 x 2.5 cm and 2.9 x 2.9 x 2.5 cm both extending submucosal. Endometrium Thickness: Abnormally thickened 21 mm thick, heterogeneous. No definite discrete mass or endometrial fluid Right ovary Measurements: Questionably visualized 2.2 x 2.1 x 2.3 cm versus artifact. Left ovary Not visualized on either transabdominal or endovaginal imaging Other findings No free pelvic fluid or adnexal masses. IMPRESSION: Multiple uterine leiomyomas. Nonvisualization of LEFT ovary and questionable visualization of a normal sized RIGHT ovary. Abnormal thickened endometrial complex 21 mm thick, heterogeneous in appearance. If bleeding remains unresponsive to hormonal or medical therapy, focal lesion work-up with sonohysterogram should be considered. Endometrial biopsy should also be considered in pre-menopausal patients at high risk for endometrial  carcinoma. (Ref: Radiological Reasoning: Algorithmic Workup of Abnormal Vaginal Bleeding with Endovaginal Sonography and Sonohysterography. AJR 2008; 676:P95-09) Electronically Signed   By: Lavonia Dana M.D.   On: 04/16/2017 12:20   US Pelvis Complete  Result Date: 04/16/2017 CLINICAL DATA:  Abnormal uterine bleeding for 6-8 months, ranges from light to heavy, associated pelvic pain with heavy bleeding, history hypertension, diabetes mellitus EXAM: TRANSABDOMINAL AND TRANSVAGINAL ULTRASOUND OF PELVIS TECHNIQUE: Both transabdominal and transvaginal ultrasound examinations of the pelvis were performed. Transabdominal technique was performed for global imaging of the pelvis including uterus, ovaries, adnexal regions, and pelvic cul-de-sac. It was necessary to proceed with endovaginal exam following the transabdominal exam to visualize the uterus and ovaries. COMPARISON:  03/07/2012 FINDINGS: Uterus Measurements: 11.0 x 6.3 x 6.7 cm. Mildly heterogeneous myometrium. Multiple focal areas of abnormal echogenicity seen likely representing leiomyomata, largest posterior mid uterus 3.1 x 2.9 x 2.5 cm and 2.9 x 2.9 x 2.5 cm both extending submucosal. Endometrium Thickness: Abnormally thickened 21 mm thick, heterogeneous. No definite discrete mass or endometrial fluid Right ovary Measurements: Questionably visualized 2.2 x 2.1 x 2.3 cm versus artifact. Left ovary Not visualized on either transabdominal or endovaginal imaging Other findings No free pelvic fluid or adnexal masses. IMPRESSION: Multiple uterine leiomyomas. Nonvisualization of LEFT ovary and questionable visualization of a normal sized RIGHT ovary. Abnormal thickened endometrial complex 21 mm thick, heterogeneous in appearance. If bleeding remains unresponsive to hormonal or medical therapy, focal lesion work-up with sonohysterogram should be considered. Endometrial biopsy should also be considered in pre-menopausal patients at high risk for endometrial  carcinoma. (Ref: Radiological Reasoning: Algorithmic Workup of Abnormal Vaginal Bleeding with Endovaginal Sonography and Sonohysterography. AJR 2008; 326:Z12-45) Electronically Signed   By: Lavonia Dana M.D.   On: 04/16/2017 12:20    Assessment & Plan:  AUB and uterine fibroids GYN with PAP Elevated BP- pt denies h/o HTN.  She has never been on meds  F/u endo bx F/u PAP F/u OV in 4 weeks  Megace 40mg  2 po bid for 4 days then 1 po bid  Routine post-procedure instructions were given to the patient. The patient will follow up to review the results and for further management   Izaiah Tabb L. Harraway-Smith, M.D., Cherlynn June

## 2017-05-13 NOTE — Patient Instructions (Addendum)
Uterine Artery Embolization for Fibroids Uterine artery embolization is a nonsurgical treatment to shrink fibroids. A thin plastic tube (catheter) is used to inject material that blocks off the blood supply to the fibroid, which causes the fibroid to shrink. Tell a health care provider about:  Any allergies you have.  All medicines you are taking, including vitamins, herbs, eye drops, creams, and over-the-counter medicines.  Any problems you or family members have had with anesthetic medicines.  Any blood disorders you have.  Any surgeries you have had.  Any medical conditions you have. What are the risks?  Injury to the uterus from decreased blood supply  Infection.  Blood infection (septicemia).  Lack of menstrual periods (amenorrhea).  Death of tissue cells (necrosis) around your bladder or vulva.  Development of a hole between organs or from an organ to the surface of your skin (fistula).  Blood clot in the legs (deep vein thrombosis) or lung (pulmonary embolus). What happens before the procedure?  Ask your health care provider about changing or stopping your regular medicines.  Do not take aspirin or blood thinners (anticoagulants) for 1 week before the surgery or as directed by your health care provider.  Do not eat or drink anything for 8 hours before the surgery or as directed by your health care provider.  Empty your bladder before the procedure begins. What happens during the procedure?  An IV tube will be placed into one of your veins. This will be used to give you a sedative and pain medication (conscious sedation).  You will be given a medicine that numbs the area (local anesthetic).  A small cut will be made in your groin. A catheter is then inserted into the main artery of your leg.  The catheter will be guided through the artery to your uterus. A series of images will be taken while dye is injected through the catheter in your groin. X-rays are taken at  the same time. This is done to provide a road map of the blood supply to your uterus and fibroids.  Tiny plastic spheres, about the size of sand grains, will be injected through the catheter. Metal coils may be used to help block the artery. The particles will lodge in tiny branches of the uterine artery that supplies blood to the fibroids.  The procedure is repeated on the artery that supplies the other side of the uterus.  The catheter is then removed and pressure is held to stop any bleeding. No stitches are needed.  A dressing is then placed over the cut (incision). What happens after the procedure?  You will be taken to a recovery area where your progress will be monitored until you are awake, stable, and taking fluids well. If there are no other problems, you will then be moved to a regular hospital room.  You will be observed overnight in the hospital.  You will have cramping that should be controlled with pain medication. This information is not intended to replace advice given to you by your health care provider. Make sure you discuss any questions you have with your health care provider. Document Released: 02/24/2006 Document Revised: 05/16/2016 Document Reviewed: 06/24/2013 Elsevier Interactive Patient Education  2017 Elsevier Inc.  Uterine Fibroids Uterine fibroids are tissue masses (tumors) that can develop in the womb (uterus). They are also called leiomyomas. This type of tumor is not cancerous (benign) and does not spread to other parts of the body outside of the pelvic area, which is between the  hip bones. Occasionally, fibroids may develop in the fallopian tubes, in the cervix, or on the support structures (ligaments) that surround the uterus. You can have one or many fibroids. Fibroids can vary in size, weight, and where they grow in the uterus. Some can become quite large. Most fibroids do not require medical treatment. What are the causes? A fibroid can develop when a  single uterine cell keeps growing (replicating). Most cells in the human body have a control mechanism that keeps them from replicating without control. What are the signs or symptoms? Symptoms may include:  Heavy bleeding during your period.  Bleeding or spotting between periods.  Pelvic pain and pressure.  Bladder problems, such as needing to urinate more often (urinary frequency) or urgently.  Inability to reproduce offspring (infertility).  Miscarriages. How is this diagnosed? Uterine fibroids are diagnosed through a physical exam. Your health care provider may feel the lumpy tumors during a pelvic exam. Ultrasonography and an MRI may be done to determine the size, location, and number of fibroids. How is this treated? Treatment may include:  Watchful waiting. This involves getting the fibroid checked by your health care provider to see if it grows or shrinks. Follow your health care provider's recommendations for how often to have this checked.  Hormone medicines. These can be taken by mouth or given through an intrauterine device (IUD).  Surgery.  Removing the fibroids (myomectomy) or the uterus (hysterectomy).  Removing blood supply to the fibroids (uterine artery embolization). If fibroids interfere with your fertility and you want to become pregnant, your health care provider may recommend having the fibroids removed. Follow these instructions at home:  Keep all follow-up visits as directed by your health care provider. This is important.  Take over-the-counter and prescription medicines only as told by your health care provider.  If you were prescribed a hormone treatment, take the hormone medicines exactly as directed.  Ask your health care provider about taking iron pills and increasing the amount of dark green, leafy vegetables in your diet. These actions can help to boost your blood iron levels, which may be affected by heavy menstrual bleeding.  Pay close  attention to your period and tell your health care provider about any changes, such as:  Increased blood flow that requires you to use more pads or tampons than usual per month.  A change in the number of days that your period lasts per month.  A change in symptoms that are associated with your period, such as abdominal cramping or back pain. Contact a health care provider if:  You have pelvic pain, back pain, or abdominal cramps that cannot be controlled with medicines.  You have an increase in bleeding between and during periods.  You soak tampons or pads in a half hour or less.  You feel lightheaded, extra tired, or weak. Get help right away if:  You faint.  You have a sudden increase in pelvic pain. This information is not intended to replace advice given to you by your health care provider. Make sure you discuss any questions you have with your health care provider. Document Released: 12/06/2000 Document Revised: 08/08/2016 Document Reviewed: 06/07/2014 Elsevier Interactive Patient Education  2017 [employer].

## 2017-05-13 NOTE — BH Specialist Note (Signed)
Integrated Behavioral Health Initial Visit  MRN: [id number] Name: Ellen Hill   Session Start time: 4:20 Session End time: 4:50 Total time: 30 minutes  Type of Service: [place] Interpretor:No. Interpretor Name and Language: n/a   Warm Hand Off Completed.       SUBJECTIVE: Ellen Hill is a 46 y.o. female accompanied by patient. Patient was referred by Dr Ihor Dow for depression, anxiety. Patient reports the following symptoms/concerns: Pt states her primary concern is feeling overwhelmingly tired, physical pain, stressed, with lack of appetite and worry interfering with sleep; pt open to learning strategy to cope with changing life stress. Duration of problem: Over one month; Severity of problem: moderate  OBJECTIVE: Mood: Appropriate and Affect: Appropriate Risk of harm to self or others: No plan to harm self or others   LIFE CONTEXT: Family and Social: Lives with 9yo daughter School/Work: Currently out of work because of health issues Self-Care: - Life Changes: Changing health, loss of work income  GOALS ADDRESSED: Patient will reduce symptoms of: anxiety, depression and stress and increase knowledge and/or ability of: self-management skills and also: Increase healthy adjustment to current life circumstances   INTERVENTIONS: Mindfulness or Relaxation Training and Psychoeducation and/or Health Education  Standardized Assessments completed: GAD-7 and PHQ 9  ASSESSMENT: Patient currently experiencing Adjustment disorder with mixed anxious and depressed mood. Patient may benefit from psychoeducation and brief therapeutic interventions regarding coping with symptoms of anxiety and depression.  PLAN: 1. Follow up with behavioral health clinician on : One month, or as needed 2. Behavioral recommendations:  -CALM relaxation breathing exercise in morning; at bedtime -Consider sleep app at bedtime for improved sleep -Read  educational material regarding coping with symptoms of anxiety and depression 3. Referral(s): [facility] [employer] [facility] (In Clinic)   Caroleen Hamman [place], [place]   Depression screen [facility] 2/9 05/13/2017 05/13/2017  Decreased Interest 3 3  Down, Depressed, Hopeless 1 1  PHQ - 2 Score 4 4  Altered sleeping 3 -  Tired, decreased energy 3 -  Change in appetite 3 -  Feeling bad or failure about yourself  1 -  Trouble concentrating 1 -  Moving slowly or fidgety/restless 0 -  Suicidal thoughts 0 -  PHQ-9 Score 15 -   GAD 7 : Generalized Anxiety Score 05/13/2017  Nervous, Anxious, on Edge 0  Control/stop worrying 1  Worry too much - different things 2  Trouble relaxing 3  Restless 0  Easily annoyed or irritable 2  Afraid - awful might happen 0  Total GAD 7 Score 8

## 2017-05-16 LAB — CYTOLOGY - PAP
Chlamydia: NEGATIVE
Diagnosis: NEGATIVE
HPV: NOT DETECTED
NEISSERIA GONORRHEA: NEGATIVE

## 2017-06-16 ENCOUNTER — Ambulatory Visit: Payer: Self-pay | Admitting: Obstetrics & Gynecology

## 2017-06-17 ENCOUNTER — Telehealth: Payer: Self-pay | Admitting: Obstetrics & Gynecology

## 2017-06-17 ENCOUNTER — Ambulatory Visit: Payer: Self-pay | Admitting: Obstetrics & Gynecology

## 2017-06-17 NOTE — Telephone Encounter (Signed)
Patient called to change her appointment due to her Medicaid changing. She would like a call back to discuss with a nurse her medication.

## 2017-06-18 NOTE — Telephone Encounter (Signed)
Called patient- no answer or voicemail to leave a message. 

## 2017-06-23 NOTE — Telephone Encounter (Signed)
Called patient no answer . I have left a message we are calling in regards to a medication question if she still has question she should called [place] back.

## 2017-07-21 ENCOUNTER — Ambulatory Visit (INDEPENDENT_AMBULATORY_CARE_PROVIDER_SITE_OTHER): Payer: Medicaid Other | Admitting: Obstetrics & Gynecology

## 2017-07-21 ENCOUNTER — Encounter: Payer: Self-pay | Admitting: Obstetrics & Gynecology

## 2017-07-21 VITALS — BP 129/83 | HR 97 | Ht 66.0 in | Wt 380.0 lb

## 2017-07-21 DIAGNOSIS — N939 Abnormal uterine and vaginal bleeding, unspecified: Secondary | ICD-10-CM | POA: Diagnosis not present

## 2017-07-21 DIAGNOSIS — N840 Polyp of corpus uteri: Secondary | ICD-10-CM

## 2017-07-21 MED ORDER — MEGESTROL ACETATE 40 MG PO TABS: 40.0000 mg | ORAL_TABLET | Freq: Two times a day (BID) | ORAL | 3 refills | Status: DC

## 2017-07-21 NOTE — Progress Notes (Signed)
History:  46 y.o. [id number] here today for f/u of AUB. Pt reports that she is taking the Megace and has been on it for for 3 montsh with no further bleeding.  She reports that it has 'changed my life.'   The following portions of the patient's history were reviewed and updated as appropriate: allergies, current medications, past family history, past medical history, past social history, past surgical history and problem list.  Review of Systems:  Pertinent items are noted in HPI.   Objective:  Physical Exam Blood pressure 129/83, pulse 97, height 5\' 6"  (1.676 m), weight (!) 380 lb (172.4 kg). CONSTITUTIONAL: Well-developed, well-nourished female in no acute distress.  HENT:  Normocephalic, atraumatic EYES: Conjunctivae and EOM are normal. No scleral icterus.  NECK: Normal range of motion SKIN: Skin is warm and dry. No rash noted. Not diaphoretic.No pallor. [place]: Alert and oriented to person, place, and time. Normal coordination.   Labs and Imaging 04/16/2017 CLINICAL DATA:  Abnormal uterine bleeding for 6-8 months, ranges from light to heavy, associated pelvic pain with heavy bleeding, history hypertension, diabetes mellitus  EXAM: TRANSABDOMINAL AND TRANSVAGINAL ULTRASOUND OF PELVIS  TECHNIQUE: Both transabdominal and transvaginal ultrasound examinations of the pelvis were performed. Transabdominal technique was performed for global imaging of the pelvis including uterus, ovaries, adnexal regions, and pelvic cul-de-sac. It was necessary to proceed with endovaginal exam following the transabdominal exam to visualize the uterus and ovaries.  COMPARISON:  03/07/2012  FINDINGS: Uterus  Measurements: 11.0 x 6.3 x 6.7 cm. Mildly heterogeneous myometrium. Multiple focal areas of abnormal echogenicity seen likely representing leiomyomata, largest posterior mid uterus 3.1 x 2.9 x 2.5 cm and 2.9 x 2.9 x 2.5 cm both extending submucosal.  Endometrium  Thickness:  Abnormally thickened 21 mm thick, heterogeneous. No definite discrete mass or endometrial fluid  Right ovary  Measurements: Questionably visualized 2.2 x 2.1 x 2.3 cm versus artifact.  Left ovary  Not visualized on either transabdominal or endovaginal imaging  Other findings  No free pelvic fluid or adnexal masses.  IMPRESSION: Multiple uterine leiomyomas.  Nonvisualization of LEFT ovary and questionable visualization of a normal sized RIGHT ovary.  Abnormal thickened endometrial complex 21 mm thick, heterogeneous in appearance.  If bleeding remains unresponsive to hormonal or medical therapy, focal lesion work-up with sonohysterogram should be considered. Endometrial biopsy should also be considered in pre-menopausal patients at high risk for endometrial carcinoma. (Ref: Radiological Reasoning: Algorithmic Workup of Abnormal Vaginal Bleeding with Endovaginal Sonography and Sonohysterography. AJR 2008; 051:T02-11)   Assessment & Plan:  AUB-   I reveiwed her surg PAth from the endo bx and the [place]  Keep Megace 40 bid for an additional 3 months  6 Months f/u or sooner prn  If pt restarts bleeding after her 3 months off, we will consider endometrial ablation  Total face-to-face time with patient was 15 min.  Greater than 50% was spent in counseling and coordination of care with the patient.   Lora Glomski L. Harraway-Smith, M.D., Cherlynn June

## 2017-08-22 ENCOUNTER — Ambulatory Visit (HOSPITAL_COMMUNITY)
Admission: EM | Admit: 2017-08-22 | Discharge: 2017-08-22 | Disposition: A | Discharge status: Home / Self Care | Payer: Medicaid Other | Attending: Physician Assistant | Admitting: Physician Assistant

## 2017-08-22 ENCOUNTER — Encounter (HOSPITAL_COMMUNITY): Payer: Self-pay | Admitting: *Deleted

## 2017-08-22 DIAGNOSIS — K0889 Other specified disorders of teeth and supporting structures: Secondary | ICD-10-CM

## 2017-08-22 MED ORDER — LIDOCAINE VISCOUS 2 % MT SOLN: 15.0000 mL | OROMUCOSAL | 0 refills | Status: DC | PRN

## 2017-08-22 MED ORDER — PENICILLIN V POTASSIUM 500 MG PO TABS: 500.0000 mg | ORAL_TABLET | Freq: Four times a day (QID) | ORAL | 0 refills | Status: AC

## 2017-08-22 MED ORDER — PENICILLIN V POTASSIUM 500 MG PO TABS: 500.0000 mg | ORAL_TABLET | Freq: Three times a day (TID) | ORAL | 0 refills | Status: DC

## 2017-08-22 MED ORDER — MELOXICAM 15 MG PO TABS
15.0000 mg | ORAL_TABLET | Freq: Every day | ORAL | 0 refills | Status: DC
Start: 2017-08-22 — End: 2018-12-08

## 2017-08-22 NOTE — ED Triage Notes (Signed)
Pt  Reports  Has  A  Cracked     r  Upper  Tooth    X   2-3   Days   Has  A  Toothache   At  This  Time     Pt  Reports   Headache   As   Well

## 2017-08-22 NOTE — Discharge Instructions (Signed)
Start Penicillin as directed for dental abscess. Mobic and lidocaine solution for pain. Follow up with dentist for further treatment and evaluation. If experiencing swelling of the throat, trouble breathing, trouble swallowing, follow up for reevaluation.

## 2017-08-22 NOTE — ED Provider Notes (Signed)
[place]    CSN: [id number] Arrival date & time: 08/22/17  1044     History   Chief Complaint Chief Complaint  Patient presents with  . Dental Pain    HPI Ellen Hill is a 46 y.o. female.   46 year old female comes in for 3 day history of dental pain. She cracked her tooth on the right upper jaw a few months ago, but it has not given her any problems until now. She has been taking ibuprofen 800 mg and using Orajel with temporary relief. She has associated headache. She has noticed some facial swelling on the right. Denies trouble breathing, trouble swallowing, swelling of the throat. Has had some chills, denies fever, night sweats.      Past Medical History:  Diagnosis Date  . Abnormal Pap smear    history  . Anemia   . ASCUS (atypical squamous cells of undetermined significance) on Pap smear 2008   At 36wk of pregnancy ; colpo  . Asthma    rarely uses inhaler (seasonal)  . 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 Fibroids   . H/O candidiasis   . H/O fatigue 08/2007  . H/O varicella   . Headache(784.0)    migraines   . Heart murmur    dx child - no problems as adult  . Hx: UTI (urinary tract infection) 07/29/07  . Increased BMI   . Irregular bleeding   . Obesity 02/13/12  . Pelvic pain 02/13/12  . Postpartum anemia 08/18/07  . 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 after loss of parents, is fine  . Vaginal Pap smear, abnormal    f/u wnl    Patient Active Problem List   Diagnosis Date Noted  . Dysmenorrhea 04/13/2012  . Obesity 04/13/2012  . Anemia 04/13/2012  . Fibroids 04/13/2012  . Menorrhagia 03/07/2012    Past Surgical History:  Procedure Laterality Date  . CERVICAL POLYPECTOMY  05/01/2012   Procedure: CERVICAL POLYPECTOMY;  Surgeon: Betsy Coder, MD;  Location: [facility];  Service: Gynecology;  Laterality: N/A;  . CESAREAN SECTION  07/04/2007  . CHOLECYSTECTOMY  07/06/2013  . CHOLECYSTECTOMY N/A 07/06/2013   Procedure: LAPAROSCOPIC  CHOLECYSTECTOMY ;  Surgeon: Gayland Curry, MD;  Location: [facility];  Service: General;  Laterality: N/A;  attempted cholangiogram  . fibroidecto    . fibroidectomy  07/04/2007  . MYOMECTOMY  07/04/08  . OPERATIVE HYSTEROSCOPY  05/01/2012   no menses since that time  . TUBAL LIGATION  07/04/2007    OB History    Gravida Para Term Preterm AB Living   4 1 0 1 3 1    SAB TAB Ectopic Multiple Live Births   0 3 0 0 1       Home Medications    Prior to Admission medications   Medication Sig Start Date End Date Taking? Authorizing Provider  ibuprofen (ADVIL,MOTRIN) 600 MG tablet Take 1 tablet (600 mg total) by mouth every 6 (six) hours as needed. 04/02/17   Laury Deep, CNM  lidocaine (XYLOCAINE) 2 % solution Use as directed 15 mLs in the mouth or throat as needed for mouth pain. 08/22/17   Ok Edwards, PA-C  megestrol (MEGACE) 40 MG tablet Take 1 tablet (40 mg total) by mouth 2 (two) times daily. Can increase to two tablets twice a day in the event of heavy bleeding 07/21/17   Lavonia Drafts, MD  meloxicam (MOBIC) 15 MG tablet Take 1 tablet (15 mg total) by mouth daily.  08/22/17   Tasia Catchings, Amy V, PA-C  penicillin v potassium (VEETID) 500 MG tablet Take 1 tablet (500 mg total) by mouth 4 (four) times daily. 08/22/17 08/29/17  Ok Edwards, PA-C    Family History Family History  Problem Relation Age of Onset  . Hypertension Mother   . Cancer Mother        Breast  . Hypertension Father   . Cancer Father        Prostate  . Heart attack Father   . Hypertension Sister   . Diabetes Maternal Aunt     Social History Social History  Substance Use Topics  . Smoking status: Former Smoker    Packs/day: 0.30    Years: 1.00    Types: Cigarettes    Quit date: 03/18/1999  . Smokeless tobacco: Never Used  . Alcohol use No     Allergies   Patient has no known allergies.   Review of Systems Review of Systems  Reason unable to perform ROS: See HPI as above.     Physical Exam Triage Vital  Signs ED Triage Vitals [08/22/17 1202]  Enc Vitals Group     BP (!) 142/95     Pulse Rate 78     Resp 18     Temp 98.4 F (36.9 C)     Temp Source Oral     SpO2 100 %     Weight      Height      Head Circumference      Peak Flow      Pain Score 10     Pain Loc      Pain Edu?      Excl. in [place]?    No data found.   Updated Vital Signs BP (!) 142/95 (BP Location: Right Arm)   Pulse 78   Temp 98.4 F (36.9 C) (Oral)   Resp 18   SpO2 100%    Physical Exam  Constitutional: She is oriented to person, place, and time. She appears well-developed and well-nourished. No distress.  HENT:  Head: Normocephalic and atraumatic.  Mouth/Throat: Uvula is midline, oropharynx is clear and moist and mucous membranes are normal. Abnormal dentition (right upper tooth).    No pain on palpation of the gums. No obvious gum swelling.   Mild facial swelling of the right. No trismus.   Eyes: Pupils are equal, round, and reactive to light. Conjunctivae and EOM are normal.  Neck: Normal range of motion. Neck supple.  Lymphadenopathy:    She has no cervical adenopathy.  Neurological: She is alert and oriented to person, place, and time.  Skin: Skin is warm and dry.     UC Treatments / Results  Labs (all labs ordered are listed, but only abnormal results are displayed) Labs Reviewed - No data to display  EKG  EKG Interpretation None       Radiology No results found.  Procedures Procedures (including critical care time)  Medications Ordered in UC Medications - No data to display   Initial Impression / Assessment and Plan / UC Course  I have reviewed the triage vital signs and the nursing notes.  Pertinent labs & imaging results that were available during my care of the patient were reviewed by me and considered in my medical decision making (see chart for details).    Start antibiotics for possible dental abscess. Symptomatic treatment as needed. Discussed with patient  symptoms can return if dental problem is not addressed. Follow up  with dentist for further evaluation and treatment of dental pain. Resources given. Return precautions given.    Final Clinical Impressions(s) / UC Diagnoses   Final diagnoses:  Pain, dental    New Prescriptions New Prescriptions   LIDOCAINE (XYLOCAINE) 2 % SOLUTION    Use as directed 15 mLs in the mouth or throat as needed for mouth pain.   MELOXICAM (MOBIC) 15 MG TABLET    Take 1 tablet (15 mg total) by mouth daily.   PENICILLIN V POTASSIUM (VEETID) 500 MG TABLET    Take 1 tablet (500 mg total) by mouth 4 (four) times daily.      Ok Edwards, PA-C 08/22/17 1227

## 2017-09-03 ENCOUNTER — Other Ambulatory Visit: Payer: Self-pay | Admitting: Family Medicine

## 2017-09-03 DIAGNOSIS — Z1231 Encounter for screening mammogram for malignant neoplasm of breast: Secondary | ICD-10-CM

## 2017-09-11 ENCOUNTER — Ambulatory Visit
Admission: RE | Admit: 2017-09-11 | Discharge: 2017-09-11 | Disposition: A | Discharge status: Home / Self Care | Payer: Medicaid Other | Source: Ambulatory Visit | Attending: Family Medicine | Admitting: Family Medicine

## 2017-09-11 DIAGNOSIS — Z1231 Encounter for screening mammogram for malignant neoplasm of breast: Secondary | ICD-10-CM

## 2018-12-08 ENCOUNTER — Encounter: Payer: Self-pay | Admitting: Family Medicine

## 2018-12-08 ENCOUNTER — Ambulatory Visit: Payer: Self-pay

## 2018-12-08 ENCOUNTER — Ambulatory Visit (INDEPENDENT_AMBULATORY_CARE_PROVIDER_SITE_OTHER): Payer: BC Managed Care – PPO | Admitting: Family Medicine

## 2018-12-08 VITALS — BP 128/90 | HR 108 | Temp 97.9°F | Ht 66.0 in | Wt >= 6400 oz

## 2018-12-08 DIAGNOSIS — R030 Elevated blood-pressure reading, without diagnosis of hypertension: Secondary | ICD-10-CM | POA: Diagnosis not present

## 2018-12-08 DIAGNOSIS — Z7689 Persons encountering health services in other specified circumstances: Secondary | ICD-10-CM | POA: Diagnosis not present

## 2018-12-08 DIAGNOSIS — Z6841 Body Mass Index (BMI) 40.0 and over, adult: Secondary | ICD-10-CM

## 2018-12-08 DIAGNOSIS — M62830 Muscle spasm of back: Secondary | ICD-10-CM

## 2018-12-08 DIAGNOSIS — M5416 Radiculopathy, lumbar region: Secondary | ICD-10-CM | POA: Diagnosis not present

## 2018-12-08 MED ORDER — CYCLOBENZAPRINE HCL 5 MG PO TABS: 5.0000 mg | ORAL_TABLET | Freq: Three times a day (TID) | ORAL | 0 refills | Status: DC | PRN

## 2018-12-08 NOTE — Telephone Encounter (Signed)
Message from [facility] sent at 12/08/2018 5:11 PM EST   Adonis Huguenin with [facility] stated that patient just had cyclobenzaprine (FLEXERIL) 10 MG tablet filled a few days ago that was prescribed by another provider. Adonis Huguenin requests a call back to advise if the prescription should be filled. Cb# [phone]     Reason for Disposition . Pharmacy calling with prescription questions and triager unable to answer question  Protocols used: MEDICATION QUESTION CALL-A-AH

## 2018-12-08 NOTE — Patient Instructions (Signed)
Back Exercises If you have pain in your back, do these exercises 2-3 times each day or as told by your doctor. When the pain goes away, do the exercises once each day, but repeat the steps more times for each exercise (do more repetitions). If you do not have pain in your back, do these exercises once each day or as told by your doctor. Exercises Single Knee to Chest  Do these steps 3-5 times in a row for each leg: 1. Lie on your back on a firm bed or the floor with your legs stretched out. 2. Bring one knee to your chest. 3. Hold your knee to your chest by grabbing your knee or thigh. 4. Pull on your knee until you feel a gentle stretch in your lower back. 5. Keep doing the stretch for 10-30 seconds. 6. Slowly let go of your leg and straighten it.  Pelvic Tilt  Do these steps 5-10 times in a row: 1. Lie on your back on a firm bed or the floor with your legs stretched out. 2. Bend your knees so they point up to the ceiling. Your feet should be flat on the floor. 3. Tighten your lower belly (abdomen) muscles to press your lower back against the floor. This will make your tailbone point up to the ceiling instead of pointing down to your feet or the floor. 4. Stay in this position for 5-10 seconds while you gently tighten your muscles and breathe evenly.  Cat-Cow  Do these steps until your lower back bends more easily: 1. Get on your hands and knees on a firm surface. Keep your hands under your shoulders, and keep your knees under your hips. You may put padding under your knees. 2. Let your head hang down, and make your tailbone point down to the floor so your lower back is round like the back of a cat. 3. Stay in this position for 5 seconds. 4. Slowly lift your head and make your tailbone point up to the ceiling so your back hangs low (sags) like the back of a cow. 5. Stay in this position for 5 seconds.  Press-Ups  Do these steps 5-10 times in a row: 1. Lie on your belly (face-down)  on the floor. 2. Place your hands near your head, about shoulder-width apart. 3. While you keep your back relaxed and keep your hips on the floor, slowly straighten your arms to raise the top half of your body and lift your shoulders. Do not use your back muscles. To make yourself more comfortable, you may change where you place your hands. 4. Stay in this position for 5 seconds. 5. Slowly return to lying flat on the floor.  Bridges  Do these steps 10 times in a row: 1. Lie on your back on a firm surface. 2. Bend your knees so they point up to the ceiling. Your feet should be flat on the floor. 3. Tighten your butt muscles and lift your butt off of the floor until your waist is almost as high as your knees. If you do not feel the muscles working in your butt and the back of your thighs, slide your feet 1-2 inches farther away from your butt. 4. Stay in this position for 3-5 seconds. 5. Slowly lower your butt to the floor, and let your butt muscles relax.  If this exercise is too easy, try doing it with your arms crossed over your chest. Belly Crunches  Do these steps 5-10 times in   a row: 1. Lie on your back on a firm bed or the floor with your legs stretched out. 2. Bend your knees so they point up to the ceiling. Your feet should be flat on the floor. 3. Cross your arms over your chest. 4. Tip your chin a little bit toward your chest but do not bend your neck. 5. Tighten your belly muscles and slowly raise your chest just enough to lift your shoulder blades a tiny bit off of the floor. 6. Slowly lower your chest and your head to the floor.  Back Lifts Do these steps 5-10 times in a row: 1. Lie on your belly (face-down) with your arms at your sides, and rest your forehead on the floor. 2. Tighten the muscles in your legs and your butt. 3. Slowly lift your chest off of the floor while you keep your hips on the floor. Keep the back of your head in line with the curve in your back. Look at  the floor while you do this. 4. Stay in this position for 3-5 seconds. 5. Slowly lower your chest and your face to the floor.  Contact a doctor if:  Your back pain gets a lot worse when you do an exercise.  Your back pain does not lessen 2 hours after you exercise. If you have any of these problems, stop doing the exercises. Do not do them again unless your doctor says it is okay. Get help right away if:  You have sudden, very bad back pain. If this happens, stop doing the exercises. Do not do them again unless your doctor says it is okay. This information is not intended to replace advice given to you by your health care provider. Make sure you discuss any questions you have with your health care provider. Document Released: 01/11/2011 Document Revised: 05/16/2016 Document Reviewed: 02/02/2015 Elsevier Interactive Patient Education  2018 [employer].  How to Take Your Blood Pressure You can take your blood pressure at home with a machine. You may need to check your blood pressure at home:  To check if you have high blood pressure (hypertension).  To check your blood pressure over time.  To make sure your blood pressure medicine is working.  Supplies needed: You will need a blood pressure machine, or monitor. You can buy one at a drugstore or online. When choosing one:  Choose one with an arm cuff.  Choose one that wraps around your upper arm. Only one finger should fit between your arm and the cuff.  Do not choose one that measures your blood pressure from your wrist or finger.  Your doctor can suggest a monitor. How to prepare Avoid these things for 30 minutes before checking your blood pressure:  Drinking caffeine.  Drinking alcohol.  Eating.  Smoking.  Exercising.  Five minutes before checking your blood pressure:  Pee.  Sit in a dining chair. Avoid sitting in a soft couch or armchair.  Be quiet. Do not talk.  How to take your blood pressure Follow the  instructions that came with your machine. If you have a digital blood pressure monitor, these may be the instructions: 1. Sit up straight. 2. Place your feet on the floor. Do not cross your ankles or legs. 3. Rest your left arm at the level of your heart. You may rest it on a table, desk, or chair. 4. Pull up your shirt sleeve. 5. Wrap the blood pressure cuff around the upper part of your left arm. The  cuff should be 1 inch (2.5 cm) above your elbow. It is best to wrap the cuff around bare skin. 6. Fit the cuff snugly around your arm. You should be able to place only one finger between the cuff and your arm. 7. Put the cord inside the groove of your elbow. 8. Press the power button. 9. Sit quietly while the cuff fills with air and loses air. 10. Write down the numbers on the screen. 11. Wait 2-3 minutes and then repeat steps 1-10.  What do the numbers mean? Two numbers make up your blood pressure. The first number is called systolic pressure. The second is called diastolic pressure. An example of a blood pressure reading is "120 over 80" (or 120/80). If you are an adult and do not have a medical condition, use this guide to find out if your blood pressure is normal: Normal  First number: below 120.  Second number: below 80. Elevated  First number: 120-129.  Second number: below 80. Hypertension stage 1  First number: 130-139.  Second number: 80-89. Hypertension stage 2  First number: 140 or above.  Second number: 2 or above. Your blood pressure is above normal even if only the top or bottom number is above normal. Follow these instructions at home:  Check your blood pressure as often as your doctor tells you to.  Take your monitor to your next doctor's appointment. Your doctor will: ? Make sure you are using it correctly. ? Make sure it is working right.  Make sure you understand what your blood pressure numbers should be.  Tell your doctor if your medicines are  causing side effects. Contact a doctor if:  Your blood pressure keeps being high. Get help right away if:  Your first blood pressure number is higher than 180.  Your second blood pressure number is higher than 120. This information is not intended to replace advice given to you by your health care provider. Make sure you discuss any questions you have with your health care provider. Document Released: 11/21/2008 Document Revised: 11/06/2016 Document Reviewed: 05/17/2016 Elsevier Interactive Patient Education  2018 [employer].  Preventing Hypertension Hypertension, commonly called high blood pressure, is when the force of blood pumping through the arteries is too strong. Arteries are blood vessels that carry blood from the heart throughout the body. Over time, hypertension can damage the arteries and decrease blood flow to important parts of the body, including the brain, heart, and kidneys. Often, hypertension does not cause symptoms until blood pressure is very high. For this reason, it is important to have your blood pressure checked on a regular basis. Hypertension can often be prevented with diet and lifestyle changes. If you already have hypertension, you can control it with diet and lifestyle changes, as well as medicine. What nutrition changes can be made? Maintain a healthy diet. This includes:  Eating less salt (sodium). Ask your health care provider how much sodium is safe for you to have. The general recommendation is to consume less than 1 tsp (2,300 mg) of sodium a day. ? Do not add salt to your food. ? Choose low-sodium options when grocery shopping and eating out.  Limiting fats in your diet. You can do this by eating low-fat or fat-free dairy products and by eating less red meat.  Eating more fruits, vegetables, and whole grains. Make a goal to eat: ? 1-2 cups of fresh fruits and vegetables each day. ? 3-4 servings of whole grains each day.  Avoiding  foods and  beverages that have added sugars.  Eating fish that contain healthy fats (omega-3 fatty acids), such as mackerel or salmon.  If you need help putting together a healthy eating plan, try the DASH diet. This diet is high in fruits, vegetables, and whole grains. It is low in sodium, red meat, and added sugars. DASH stands for Dietary Approaches to Stop Hypertension. What lifestyle changes can be made?  Lose weight if you are overweight. Losing just 3?5% of your body weight can help prevent or control hypertension. ? For example, if your present weight is 200 lb (91 kg), a loss of 3-5% of your weight means losing 6-10 lb (2.7-4.5 kg). ? Ask your health care provider to help you with a diet and exercise plan to safely lose weight.  Get enough exercise. Do at least 150 minutes of moderate-intensity exercise each week. ? You could do this in short exercise sessions several times a day, or you could do longer exercise sessions a few times a week. For example, you could take a brisk 10-minute walk or bike ride, 3 times a day, for 5 days a week.  Find ways to reduce stress, such as exercising, meditating, listening to music, or taking a yoga class. If you need help reducing stress, ask your health care provider.  Do not smoke. This includes e-cigarettes. Chemicals in tobacco and nicotine products raise your blood pressure each time you smoke. If you need help quitting, ask your health care provider.  Avoid alcohol. If you drink alcohol, limit alcohol intake to no more than 1 drink a day for nonpregnant women and 2 drinks a day for men. One drink equals 12 oz of beer, 5 oz of wine, or 1 oz of hard liquor. Why are these changes important? Diet and lifestyle changes can help you prevent hypertension, and they may make you feel better overall and improve your quality of life. If you have hypertension, making these changes will help you control it and help prevent major complications, such as:  Hardening and  narrowing of arteries that supply blood to: ? Your heart. This can cause a heart attack. ? Your brain. This can cause a stroke. ? Your kidneys. This can cause kidney failure.  Stress on your heart muscle, which can cause heart failure.  What can I do to lower my risk?  Work with your health care provider to make a hypertension prevention plan that works for you. Follow your plan and keep all follow-up visits as told by your health care provider.  Learn how to check your blood pressure at home. Make sure that you know your personal target blood pressure, as told by your health care provider. How is this treated? In addition to diet and lifestyle changes, your health care provider may recommend medicines to help lower your blood pressure. You may need to try a few different medicines to find what works best for you. You also may need to take more than one medicine. Take over-the-counter and prescription medicines only as told by your health care provider. Where to find support: Your health care provider can help you prevent hypertension and help you keep your blood pressure at a healthy level. Your local hospital or your community may also provide support services and prevention programs. The American Heart Association offers an online support network at: CheapBootlegs.com.cy Where to find more information: Learn more about hypertension from:  [place], [facility] Blood Institute: ElectronicHangman.is  Centers for Disease Control and Prevention:  https://ingram.com/  American Academy of Family Physicians: http://familydoctor.org/familydoctor/en/diseases-conditions/high-blood-pressure.printerview.all.html  Learn more about the DASH diet from:  [place], [facility] [place]: https://www.reyes.com/  Contact a health care provider if:  You think you are having a reaction to  medicines you have taken.  You have recurrent headaches or feel dizzy.  You have swelling in your ankles.  You have trouble with your vision. Summary  Hypertension often does not cause any symptoms until blood pressure is very high. It is important to get your blood pressure checked regularly.  Diet and lifestyle changes are the most important steps in preventing hypertension.  By keeping your blood pressure in a healthy range, you can prevent complications like heart attack, heart failure, stroke, and kidney failure.  Work with your health care provider to make a hypertension prevention plan that works for you. This information is not intended to replace advice given to you by your health care provider. Make sure you discuss any questions you have with your health care provider. Document Released: 12/24/2015 Document Revised: 08/19/2016 Document Reviewed: 08/19/2016 Elsevier Interactive Patient Education  [employer].

## 2018-12-08 NOTE — Progress Notes (Signed)
Patient presents to clinic today to establish care.  SUBJECTIVE: PMH: Pt is a 47 yo female with pmh sig for obesitiy, HTN.  Previously seen by Dr. Nyra Market at [facility] family [facility].  HTN: -Patient states she does not believe she has high blood pressure. -In the past she was on metoprolol, clonidine -BP was 176H-607P systolic -Patient's brother, sister, and dad have HTN. -Patient endorses eating fast food several times per week -Pt states her bp was up 2/2 pain in the past  back pain: -x 2-3 weeks -t endorses spasms and right lower back -Denies injury, heavy lifting, pushing/pulling -States that when her back is spasming it stops her in her tracks.  Also notes numbness in the legs from midcalf down. -states she told her previous provider about this in the past. -Pt was given muscle relaxer and ibuprofen. -Only takes muscle relaxer at home as it makes her sleepy. -pt typically walks during lunch for exercise, but has not been able to walk as far given her back pain.  Weight concern: -pt typically walks for exercise at lunch. -noted difficulty with recent back injury -trying to drink more water -may have 2 meals per day.  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  She is also in school studying business administration with concentration in accounting online.  Patient denies alcohol, tobacco, drug use.  Health Maintenance: Mammogram --2018 PAP --August 2018   Past Medical History:  Diagnosis Date  . Abnormal Pap smear    history  . Anemia   . ASCUS (atypical squamous cells of undetermined significance) on Pap smear 2008   At 36wk of pregnancy ; colpo  . Asthma    rarely uses inhaler (seasonal)  . 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 Fibroids   . H/O candidiasis   . H/O fatigue 08/2007  . H/O varicella   .  Headache(784.0)    migraines   . Heart murmur    dx child - no problems as adult  . Hx: UTI (urinary tract infection) 07/29/07  . Increased BMI   . Irregular bleeding   . Obesity 02/13/12  . Pelvic pain 02/13/12  . Postpartum anemia 08/18/07  . 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 after loss of parents, is fine  . Vaginal Pap smear, abnormal    f/u wnl    Past Surgical History:  Procedure Laterality Date  . CERVICAL POLYPECTOMY  05/01/2012   Procedure: CERVICAL POLYPECTOMY;  Surgeon: Betsy Coder, MD;  Location: [facility];  Service: Gynecology;  Laterality: N/A;  . CESAREAN SECTION  07/04/2007  . CHOLECYSTECTOMY  07/06/2013  . CHOLECYSTECTOMY N/A 07/06/2013   Procedure: LAPAROSCOPIC CHOLECYSTECTOMY ;  Surgeon: Gayland Curry, MD;  Location: [facility];  Service: General;  Laterality: N/A;  attempted cholangiogram  . fibroidecto    . fibroidectomy  07/04/2007  . MYOMECTOMY  07/04/08  . OPERATIVE HYSTEROSCOPY  05/01/2012   no menses since that time  . TUBAL LIGATION  07/04/2007    Current Outpatient Medications on File Prior to Visit  Medication Sig Dispense Refill  . cloNIDine (CATAPRES) 0.2 MG tablet Take 0.2 mg by mouth 2 (two) times daily.    . cyclobenzaprine (FLEXERIL) 10 MG tablet Take 10 mg by mouth 3 (three) times daily as needed for muscle spasms.    Marland Kitchen ibuprofen (ADVIL,MOTRIN) 600 MG tablet Take 1 tablet (600 mg total)  by mouth every 6 (six) hours as needed. (Patient taking differently: Take 800 mg by mouth every 6 (six) hours as needed. ) 30 tablet 0  . megestrol (MEGACE) 40 MG tablet Take 1 tablet (40 mg total) by mouth 2 (two) times daily. Can increase to two tablets twice a day in the event of heavy bleeding 60 tablet 3  . metoprolol tartrate (LOPRESSOR) 100 MG tablet Take 100 mg by mouth 2 (two) times daily.    Marland Kitchen OMEPRAZOLE PO Take 40 mg by mouth as needed.    Marland Kitchen spironolactone (ALDACTONE) 25 MG tablet Take 25 mg by mouth 2 (two) times daily.     No current facility-administered  medications on file prior to visit.     No Known Allergies  Family History  Problem Relation Age of Onset  . Hypertension Mother   . Cancer Mother        Breast  . Breast cancer Mother   . Hypertension Father   . Cancer Father        Prostate  . Heart attack Father   . Hypertension Sister   . Diabetes Maternal Aunt     Social History   Socioeconomic History  . Marital status: Single    Spouse name: Not on file  . Number of children: Not on file  . Years of education: Not on file  . Highest education level: Not on file  Occupational History  . Not on file  Social Needs  . Financial resource strain: Not on file  . Food insecurity:    Worry: Not on file    Inability: Not on file  . Transportation needs:    Medical: Not on file    Non-medical: Not on file  Tobacco Use  . Smoking status: Former Smoker    Packs/day: 0.30    Years: 1.00    Pack years: 0.30    Types: Cigarettes    Last attempt to quit: 03/18/1999    Years since quitting: 19.7  . Smokeless tobacco: Never Used  Substance and Sexual Activity  . Alcohol use: No  . Drug use: No  . Sexual activity: Not Currently    Birth control/protection: Surgical  Lifestyle  . Physical activity:    Days per week: Not on file    Minutes per session: Not on file  . Stress: Not on file  Relationships  . Social connections:    Talks on phone: Not on file    Gets together: Not on file    Attends religious service: Not on file    Active member of club or organization: Not on file    Attends meetings of clubs or organizations: Not on file    Relationship status: Not on file  . Intimate partner violence:    Fear of current or ex partner: Not on file    Emotionally abused: Not on file    Physically abused: Not on file    Forced sexual activity: Not on file  Other Topics Concern  . Not on file  Social History Narrative  . Not on file    ROS General: Denies fever, chills, night sweats, changes in weight, changes  in appetite HEENT: Denies headaches, ear pain, changes in vision, rhinorrhea, sore throat CV: Denies CP, palpitations, SOB, orthopnea Pulm: Denies SOB, cough, wheezing GI: Denies abdominal pain, nausea, vomiting, diarrhea, constipation GU: Denies dysuria, hematuria, frequency, vaginal discharge Msk: Denies muscle cramps, joint pains  +back pain/spasm, numbness in LEs Neuro: Denies weakness, numbness,  tingling Skin: Denies rashes, bruising Psych: Denies depression, anxiety, hallucinations  BP 128/90 (BP Location: Left Arm, Patient Position: Sitting, Cuff Size: Large) Comment (Cuff Size): thigh high cuff  Pulse (!) 108   Temp 97.9 F (36.6 C) (Oral)   Ht 5\' 6"  (1.676 m)   Wt (!) 409 lb (185.5 kg)   SpO2 98%   BMI 66.01 kg/m   Physical Exam Gen. Pleasant, well developed, well-nourished, obese, in NAD HEENT - [place]/AT, PERRL, no scleral icterus, no nasal drainage, pharynx without erythema or exudate. TMs normal b/l Lungs: no use of accessory muscles, CTAB, no wheezes, rales or rhonchi Cardiovascular: RRR, No r/g/m, no peripheral edema Musculoskeletal: TTP of R lower back in crease created by skin.  Indentions in b/l shoulders from bra strap.  No TTP of butt or LEs.  no deformities, moves all four extremities, no cyanosis or clubbing, normal tone Neuro:  A&Ox3, CN II-XII intact, normal gait Skin:  Warm, dry, intact, no lesions, hyperpigmentation of posterior neck and skin tags noted.  No results found for this or any previous visit (from the past 2160 hour(s)).  Assessment/Plan: Muscle spasm of back  -discussed various causes  -can used NSAIDs during the day, biofreeze or other topical muscle reliever ok. -given handout - Plan: cyclobenzaprine (FLEXERIL) 5 MG tablet  Lumbar back pain with radiculopathy affecting lower extremity -Discussed various causes of lumbar back pain.  Pt's back pain likely worse 2/2 large breast size and obesity. -encouraged to continue walking for exercise to  help with weight loss -continue NSAIDs, MSK relaxer qhs prn, heat, massage, topical pain reliever -given handouts  Elevated blood pressure reading -Pt advised of HTN dx. -discussed diastolic pressure elevated in clinic -pt advised to reduce the amount of fast food she is eating daily. -discussed checking bp at home and increasing p.o. intake of water.  Class III severe obesity due to excess calories without serious comorbidity with body mass index (BMI) of 60.0-69.9 in adult [facility]) -Discussed continuing to walk daily for exercise -Patient congratulated on eliminating sodas from diet -Encouraged to increase p.o. intake of water -Encouraged to eat several small meals per day -Consider follow-up with nutrition -We will continue to monitor  Encounter to establish care  -We reviewed the PMH, PSH, FH, SH, Meds and Allergies. -We provided refills for any medications we will prescribe as needed. -We addressed current concerns per orders and patient instructions. -We have asked for records for pertinent exams, studies, vaccines and notes from previous providers. -We have advised patient to follow up per instructions below.   F/u in 1 month More than 50% of over 35 minutes spent face-to-face with the patient, counseling and/or coordinating care.    Grier Mitts, MD

## 2018-12-09 ENCOUNTER — Encounter: Payer: Self-pay | Admitting: Family Medicine

## 2018-12-09 NOTE — Telephone Encounter (Signed)
Called pt pharmacy advised to hold Rx for future refill since pt just refilled this medication

## 2018-12-14 ENCOUNTER — Encounter (HOSPITAL_BASED_OUTPATIENT_CLINIC_OR_DEPARTMENT_OTHER): Payer: Self-pay

## 2018-12-14 ENCOUNTER — Other Ambulatory Visit: Payer: Self-pay

## 2018-12-14 ENCOUNTER — Emergency Department (HOSPITAL_BASED_OUTPATIENT_CLINIC_OR_DEPARTMENT_OTHER)
Admission: EM | Admit: 2018-12-14 | Discharge: 2018-12-14 | Disposition: A | Discharge status: Home / Self Care | Payer: BC Managed Care – PPO | Attending: Emergency Medicine | Admitting: Emergency Medicine

## 2018-12-14 DIAGNOSIS — M5416 Radiculopathy, lumbar region: Secondary | ICD-10-CM | POA: Insufficient documentation

## 2018-12-14 DIAGNOSIS — R202 Paresthesia of skin: Secondary | ICD-10-CM | POA: Diagnosis not present

## 2018-12-14 DIAGNOSIS — Z87891 Personal history of nicotine dependence: Secondary | ICD-10-CM | POA: Diagnosis not present

## 2018-12-14 DIAGNOSIS — R200 Anesthesia of skin: Secondary | ICD-10-CM | POA: Diagnosis not present

## 2018-12-14 DIAGNOSIS — Z79899 Other long term (current) drug therapy: Secondary | ICD-10-CM | POA: Insufficient documentation

## 2018-12-14 DIAGNOSIS — Z8679 Personal history of other diseases of the circulatory system: Secondary | ICD-10-CM | POA: Insufficient documentation

## 2018-12-14 DIAGNOSIS — J45909 Unspecified asthma, uncomplicated: Secondary | ICD-10-CM | POA: Diagnosis not present

## 2018-12-14 DIAGNOSIS — M545 Low back pain: Secondary | ICD-10-CM | POA: Diagnosis present

## 2018-12-14 MED ORDER — NAPROXEN 500 MG PO TABS: 500.0000 mg | ORAL_TABLET | Freq: Two times a day (BID) | ORAL | 0 refills | Status: DC

## 2018-12-14 NOTE — ED Provider Notes (Signed)
[facility]ER[facility] DEPARTMENT Provider Note   CSN: [id number] Arrival date & time: 12/14/18  1347     History   Chief Complaint Chief Complaint  Patient presents with  . Back Pain    HPI Ellen Hill is a 47 y.o. female.  Patient is a 47 year old female with a history of obesity, anemia and headaches who is presenting today with back pain and leg pain.  Patient states about 3 weeks ago she started having severe pain and spasm in the right lower back.  She saw her doctor and they started her on muscle relaxers and NSAIDs.  She states over the last few weeks the back pain has improved however now she is having pain that radiates down mostly the right leg but also numbness and tingling sensation in bilateral legs right greater than left and feeling like her legs are getting give out on her.  She is started walking by holding onto things because she is just concerned she may fall.  She also will intermittently urinate on herself or have a bowel movement on herself because she states she just cannot hold it but does have the sensation when she needs to go.  No prior history of back issues or surgeries.  No steroid injections of the back.  No IV drug use.  The history is provided by the patient.  Back Pain   This is a new problem. Episode onset: 3 weeks ago. The problem occurs constantly. The problem has been gradually worsening. The pain is associated with no known injury. The pain is present in the lumbar spine. The quality of the pain is described as stabbing, cramping and shooting. The pain radiates to the left thigh, right knee, right foot, right thigh, left foot and left knee. The pain is at a severity of 6/10. The pain is moderate. The symptoms are aggravated by bending, twisting and certain positions. The pain is the same all the time. Associated symptoms include numbness, bowel incontinence, leg pain, paresthesias and tingling. Pertinent negatives include no chest pain,  no fever, no abdominal pain and no bladder incontinence. She has tried muscle relaxants and NSAIDs for the symptoms. The treatment provided mild relief. Risk factors include obesity.    Past Medical History:  Diagnosis Date  . Abnormal Pap smear    history  . Anemia   . ASCUS (atypical squamous cells of undetermined significance) on Pap smear 2008   At 36wk of pregnancy ; colpo  . Asthma    rarely uses inhaler (seasonal)  . 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 Fibroids   . H/O candidiasis   . H/O fatigue 08/2007  . H/O varicella   . Headache(784.0)    migraines   . Heart murmur    dx child - no problems as adult  . Hx: UTI (urinary tract infection) 07/29/07  . Increased BMI   . Irregular bleeding   . Migraines   . Obesity 02/13/12  . Pelvic pain 02/13/12  . Postpartum anemia 08/18/07  . 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 after loss of parents, is fine  . Vaginal Pap smear, abnormal    f/u wnl    Patient Active Problem List   Diagnosis Date Noted  . Lumbar back pain with radiculopathy affecting lower extremity 12/08/2018  . Dysmenorrhea 04/13/2012  . Class 3 severe obesity due to excess calories without serious comorbidity with body mass index (BMI) of 60.0 to 69.9 in adult ([place]) 04/13/2012  . Anemia  04/13/2012  . Fibroids 04/13/2012  . Menorrhagia 03/07/2012    Past Surgical History:  Procedure Laterality Date  . CERVICAL POLYPECTOMY  05/01/2012   Procedure: CERVICAL POLYPECTOMY;  Surgeon: Betsy Coder, MD;  Location: [facility];  Service: Gynecology;  Laterality: N/A;  . CESAREAN SECTION  07/04/2007  . CHOLECYSTECTOMY  07/06/2013  . CHOLECYSTECTOMY N/A 07/06/2013   Procedure: LAPAROSCOPIC CHOLECYSTECTOMY ;  Surgeon: Gayland Curry, MD;  Location: [facility];  Service: General;  Laterality: N/A;  attempted cholangiogram  . fibroidecto    . fibroidectomy  07/04/2007  . MYOMECTOMY  07/04/08  . OPERATIVE HYSTEROSCOPY  05/01/2012   no menses since that time  . TUBAL LIGATION   07/04/2007     OB History    Gravida  4   Para  1   Term  0   Preterm  1   AB  3   Living  1     SAB  0   TAB  3   Ectopic  0   Multiple  0   Live Births  1            Home Medications    Prior to Admission medications   Medication Sig Start Date End Date Taking? Authorizing Provider  cloNIDine (CATAPRES) 0.2 MG tablet Take 0.2 mg by mouth 2 (two) times daily.    [provider]  cyclobenzaprine (FLEXERIL) 5 MG tablet Take 1 tablet (5 mg total) by mouth 3 (three) times daily as needed for muscle spasms. 12/08/18   Billie Ruddy, MD  ibuprofen (ADVIL,MOTRIN) 600 MG tablet Take 1 tablet (600 mg total) by mouth every 6 (six) hours as needed. Patient taking differently: Take 800 mg by mouth every 6 (six) hours as needed.  04/02/17   Laury Deep, CNM  megestrol (MEGACE) 40 MG tablet Take 1 tablet (40 mg total) by mouth 2 (two) times daily. Can increase to two tablets twice a day in the event of heavy bleeding 07/21/17   Lavonia Drafts, MD  metoprolol tartrate (LOPRESSOR) 100 MG tablet Take 100 mg by mouth 2 (two) times daily.    [provider]  naproxen (NAPROSYN) 500 MG tablet Take 1 tablet (500 mg total) by mouth 2 (two) times daily. 12/14/18   Blanchie Dessert, MD  OMEPRAZOLE PO Take 40 mg by mouth as needed.    [provider]  spironolactone (ALDACTONE) 25 MG tablet Take 25 mg by mouth 2 (two) times daily.    [provider]    Family History Family History  Problem Relation Age of Onset  . Hypertension Mother   . Cancer Mother        Breast  . Breast cancer Mother   . Hypertension Father   . Cancer Father        Prostate  . Heart attack Father   . Hypertension Sister   . Diabetes Maternal Aunt     Social History Social History   Tobacco Use  . Smoking status: Former Smoker    Packs/day: 0.30    Years: 1.00    Pack years: 0.30    Types: Cigarettes    Last attempt to quit: 03/18/1999    Years  since quitting: 19.7  . Smokeless tobacco: Never Used  Substance Use Topics  . Alcohol use: No  . Drug use: No     Allergies   Patient has no known allergies.   Review of Systems Review of Systems  Constitutional: Negative for fever.  Cardiovascular: Negative for chest pain.  Gastrointestinal: Positive for bowel incontinence. Negative for abdominal pain.  Genitourinary: Negative for bladder incontinence.  Musculoskeletal: Positive for back pain.  Neurological: Positive for tingling, numbness and paresthesias.  All other systems reviewed and are negative.    Physical Exam Updated Vital Signs BP (!) 145/90   Pulse 89   Temp 98.3 F (36.8 C) (Oral)   Resp 18   SpO2 99%   Physical Exam Vitals signs and nursing note reviewed.  Constitutional:      General: She is not in acute distress.    Appearance: She is well-developed.     Comments: Morbidly obese  HENT:     Head: Normocephalic and atraumatic.  Eyes:     Pupils: Pupils are equal, round, and reactive to light.  Neck:     Musculoskeletal: Normal range of motion and neck supple. Normal range of motion. No neck rigidity, spinous process tenderness or muscular tenderness.  Cardiovascular:     Rate and Rhythm: Normal rate and regular rhythm.     Heart sounds: Normal heart sounds. No murmur.  Pulmonary:     Effort: Pulmonary effort is normal.     Breath sounds: Normal breath sounds. No wheezing or rales.  Abdominal:     General: There is no distension.     Palpations: Abdomen is soft.     Tenderness: There is no abdominal tenderness.  Musculoskeletal:        General: No tenderness.     Lumbar back: She exhibits decreased range of motion and pain. She exhibits no swelling, no deformity and normal pulse.  Skin:    General: Skin is warm and dry.     Capillary Refill: Capillary refill takes less than 2 seconds.     Findings: No rash.  Neurological:     Mental Status: She is alert and oriented to person, place, and  time.     Coordination: Coordination normal.     Deep Tendon Reflexes:     Reflex Scores:      Patellar reflexes are 1+ on the right side and 1+ on the left side.    Comments: Decreased sensation in bilateral lower ext.  No foot drop.  Normal reflexes.  5/5 strength in bilateral lower ext.  Normal rectal tone and sensation.  Normal perineal sensation.  Able to ambulate here.      ED Treatments / Results  Labs (all labs ordered are listed, but only abnormal results are displayed) Labs Reviewed - No data to display  EKG None  Radiology No results found.  Procedures Procedures (including critical care time)  Medications Ordered in ED Medications - No data to display   Initial Impression / Assessment and Plan / ED Course  I have reviewed the triage vital signs and the nursing notes.  Pertinent labs & imaging results that were available during my care of the patient were reviewed by me and considered in my medical decision making (see chart for details).     Pt with gradual onset of back pain suggestive of radiculopathy.  No neurovascular compromise and no incontinence.  Pt has no infectious sx, hx of CA  or other red flags concerning for pathologic back pain.  Pt is able to ambulate but is painful.  Normal strength and reflexes on exam.  Decreased sensation in the right leg and left leg.  Normal sensation in the perineum and rectum and normal rectal tone.  Denies trauma. Will give pt pain control and  to return for developement of above sx.  Discussed f/u with her PCP for possible PT or MRI   Final Clinical Impressions(s) / ED Diagnoses   Final diagnoses:  Lumbar radiculopathy, acute    ED Discharge Orders         Ordered    naproxen (NAPROSYN) 500 MG tablet  2 times daily     12/14/18 1746           Blanchie Dessert, MD 12/14/18 1844

## 2018-12-14 NOTE — ED Notes (Signed)
Pt ambulated to the restroom with a slow gait. Pt steadied herself on the wall and with this RN.

## 2018-12-14 NOTE — ED Triage Notes (Signed)
Pt c/o low back x3 weeks. Pt states the back pain is causing her to loose strength in bilateral legs. Pt is also having urinary and fecal incontinence with the back pain.

## 2018-12-14 NOTE — ED Notes (Signed)
Nurse first-pt c/o bilat LE numbness x 3 weeks-NAD-pt was walking up to door-family member brought pt the rest of the way in via w/c

## 2018-12-14 NOTE — Discharge Instructions (Signed)
If you start having no sensation in the rectum or you can't urinate.  Or is unable to move the leg due to weakness return immediately

## 2018-12-15 ENCOUNTER — Telehealth: Payer: Self-pay | Admitting: Family Medicine

## 2018-12-15 NOTE — Telephone Encounter (Signed)
Copied from [place] [id number]. Topic: Quick Communication - See Telephone Encounter >> Dec 15, 2018  8:08 AM Margot Ables wrote: CRM for notification. See Telephone encounter for: 12/15/18.  Pt called stating she was seen in the ER at [facility] 12/14/18. They did not have MRI unit and advised her to call PCP to discuss PT or MRI. Pt requesting MRI for lower back pain and leg numbness. Pt saw Dr. Volanda Napoleon 12/08/18 for same issue but stated it got worse.

## 2018-12-15 NOTE — Telephone Encounter (Signed)
Spoke with patient and she has an appointment with Dr Volanda Napoleon 01/08/19.  An earlier appointment was offered, but the patient declined.

## 2018-12-21 ENCOUNTER — Encounter: Payer: Self-pay | Admitting: Family Medicine

## 2018-12-21 ENCOUNTER — Ambulatory Visit (INDEPENDENT_AMBULATORY_CARE_PROVIDER_SITE_OTHER): Payer: BC Managed Care – PPO | Admitting: Family Medicine

## 2018-12-21 VITALS — BP 140/82 | HR 108 | Temp 98.5°F | Wt >= 6400 oz

## 2018-12-21 DIAGNOSIS — M5442 Lumbago with sciatica, left side: Secondary | ICD-10-CM | POA: Diagnosis not present

## 2018-12-21 DIAGNOSIS — M5441 Lumbago with sciatica, right side: Secondary | ICD-10-CM | POA: Diagnosis not present

## 2018-12-21 MED ORDER — METHYLPREDNISOLONE 4 MG PO TBPK: ORAL_TABLET | ORAL | 0 refills | Status: DC

## 2018-12-21 NOTE — Progress Notes (Signed)
   Subjective:    Patient ID: Ellen Hill, female    DOB: 07-May-1971, 47 y.o.   MRN: [id number]  HPI Here for continuing low back pain. This started about 6 weeks ago. No hx of trauma. She has had severe pain in the lower back and pain radiates down both legs. The right leg is more affected than the left. Both legs also have numbness and tingling, and they are showing some weakness. She says she has almost fallen a few times when he legs give out beneath her. She has not been able to work. She was seen here on 12-08-18 and again in the ER on 12-14-18. She is taking Ibuprofen and Flexeril with no relief.    Review of Systems  Constitutional: Negative.   Respiratory: Negative.   Cardiovascular: Negative.   Musculoskeletal: Positive for back pain.  Neurological: Positive for weakness and numbness.       Objective:   Physical Exam Constitutional:      Appearance: She is obese.     Comments: She is in obvious pain, limping   Cardiovascular:     Rate and Rhythm: Normal rate and regular rhythm.     Pulses: Normal pulses.     Heart sounds: Normal heart sounds.  Pulmonary:     Effort: Pulmonary effort is normal.     Breath sounds: Normal breath sounds.  Musculoskeletal:     Comments: Tender in the lower back and over both sciatic notches. ROM is limited by pain. Positive bilateral SLR.   Neurological:     General: No focal deficit present.     Mental Status: She is oriented to person, place, and time.           Assessment & Plan:  Low back pain with sciatica. Given a Medrol dose pack. Written out of work from 01-14-18 until 12-28-18. Set up a lumbar spine MRI soon.  Alysia Penna, MD

## 2018-12-27 ENCOUNTER — Ambulatory Visit
Admission: RE | Admit: 2018-12-27 | Discharge: 2018-12-27 | Disposition: A | Discharge status: Home / Self Care | Payer: BC Managed Care – PPO | Source: Ambulatory Visit | Attending: Family Medicine | Admitting: Family Medicine

## 2018-12-27 DIAGNOSIS — M5442 Lumbago with sciatica, left side: Principal | ICD-10-CM

## 2018-12-27 DIAGNOSIS — M5441 Lumbago with sciatica, right side: Secondary | ICD-10-CM

## 2018-12-28 ENCOUNTER — Telehealth: Payer: Self-pay | Admitting: Family Medicine

## 2018-12-28 NOTE — Telephone Encounter (Signed)
Please inform her of the MRI result and she can follow up with Dr. Volanda Napoleon

## 2018-12-28 NOTE — Telephone Encounter (Signed)
Patient called in and stated that she will need a note for work due to not being able to return to work today as previous note states because she is unable to walk with out assistance.

## 2018-12-28 NOTE — Telephone Encounter (Signed)
Called and spoke with Ellen Hill and she is aware of results of the MRI.  appt scheduled with Dr. Volanda Napoleon for Wednesday at 10 am.  Ellen Hill voiced her understanding of appt.

## 2018-12-30 ENCOUNTER — Encounter: Payer: Self-pay | Admitting: Family Medicine

## 2018-12-30 ENCOUNTER — Ambulatory Visit (INDEPENDENT_AMBULATORY_CARE_PROVIDER_SITE_OTHER): Payer: BC Managed Care – PPO | Admitting: Family Medicine

## 2018-12-30 VITALS — BP 126/82 | HR 102 | Temp 98.2°F | Wt 398.0 lb

## 2018-12-30 DIAGNOSIS — M5116 Intervertebral disc disorders with radiculopathy, lumbar region: Secondary | ICD-10-CM | POA: Diagnosis not present

## 2018-12-30 NOTE — Patient Instructions (Signed)
Herniated Disk  A herniated disk, also called a ruptured disk or slipped disk, occurs when a disk in the spine bulges out too far. Between the bones in the spine (vertebrae), there are oval disks that are made of a soft, spongy center that is surrounded by a tough outer ring. The disks connect your vertebrae, help your spine move, and absorb shocks from your movement. When you have a herniated disk, the spongy center of the disk bulges out or breaks through the outer ring. It can press on a nerve between the vertebrae and cause pain. This can occur anywhere in the back or neck area, but the lower back is most commonly affected. What are the causes? This condition may be caused by:  Age-related wear and tear. The spongy centers of spinal disks tend to shrink and dry out with age, which makes them more likely to herniate.  Sudden injury, such as a strain or sprain. What increases the risk? Aging is the main risk factor for a herniated disk. Other risk factors include:  Being a man who is 30-50 years old.  Frequently doing activities that involve heavy lifting, bending, or twisting.  Frequently driving for long hours at a time.  Not getting enough exercise.  Being overweight.  Smoking.  Having a family history of back problems or herniated disks.  Being pregnant or giving birth.  Having poor nutrition.  Being tall. What are the signs or symptoms? Symptoms may vary depending on where your herniated disk is located.  A herniated disk in the lower back may cause sharp pain in: ? Part of the arm, leg, hip, or buttocks. ? The back of the lower leg (calf). ? The lower back, spreading down through the leg into the foot (sciatica).  A herniated disk in the neck may cause dizziness and vertigo. It may also cause pain or weakness in: ? The neck. ? The shoulder blades. ? Upper arm, forearm, or fingers.  You may also have muscle weakness. It may be difficult to: ? Lift your leg or  arm. ? Stand on your toes. ? Squeeze tightly with one of your hands.  Other symptoms may include: ? Numbness or tingling in the affected areas of the hands, arms, feet, or legs. ? Inability to control when you urinate or when you have bowel movements. This is a rare but serious sign of a severe herniated disk in the lower back. How is this diagnosed? This condition may be diagnosed based on:  Your symptoms.  Your medical history.  A physical exam. The exam may include: ? Straight-leg test. You will lie on your back while your health care provider lifts your leg, keeping your knee straight. If you feel pain, you likely have a herniated disk. ? Neurological tests. This includes checking for numbness, reflexes, muscle strength, and posture.  Imaging tests, such as: ? X-rays. ? MRI. ? CT scan. ? Electromyogram (EMG) to check the nerves that control muscles. This test may be used to determine which nerves are affected by your herniated disk. How is this treated? Treatment for this condition may include:  A short period of rest. This is usually the first treatment. ? You may be on bed rest for up to 2 days, or you may be instructed to stay home and avoid physical activity. ? If you have a herniated disk in your lower back, avoid sitting as much as possible. Sitting increases pressure on the disk.  Medicines. These may include: ? NSAIDs   to help reduce pain and swelling. ? Muscle relaxants to prevent sudden tightening of the back muscles (back spasms). ? Prescription pain medicines, if you have severe pain.  Steroid injections in the area of the herniated disk. This can help reduce pain and swelling.  Physical therapy to strengthen your back muscles. In many cases, symptoms go away with treatment over a period of days or weeks. You will most likely be free of symptoms after 3-4 months. If other treatments do not help to relieve your symptoms, you may need surgery. Follow these  instructions at home: Medicines  Take over-the-counter and prescription medicines only as told by your health care provider.  Do not drive or use heavy machinery while taking prescription pain medicine. Activity  Rest as directed.  After your rest period: ? Return to your normal activities and gradually begin exercising as told by your health care provider. Ask your health care provider what activities and exercises are safe for you. ? Use good posture. ? Avoid movements that cause pain. ? Do not lift anything that is heavier than 10 lb (4.5 kg) until your health care provider says this is safe. ? Do not sit or stand for long periods of time without changing positions. ? Do not sit for long periods of time without getting up and moving around.  If physical therapy was prescribed, do exercises as instructed.  Aim to strengthen muscles in your back and abdomen with exercises like crunches, swimming, or walking. General instructions  Do not use any products that contain nicotine or tobacco, such as cigarettes and e-cigarettes. These products can delay healing. If you need help quitting, ask your health care provider.  Do not wear high-heeled shoes.  Do not sleep on your belly.  If you are overweight, work with your health care provider to lose weight safely.  To prevent or treat constipation while you are taking prescription pain medicine, your health care provider may recommend that you: ? Drink enough fluid to keep your urine clear or pale yellow. ? Take over-the-counter or prescription medicines. ? Eat foods that are high in fiber, such as fresh fruits and vegetables, whole grains, and beans. ? Limit foods that are high in fat and processed sugars, such as fried and sweet foods.  Keep all follow-up visits as told by your health care provider. This is important. How is this prevented?   Maintain a healthy weight.  Try to avoid stressful situations.  Maintain physical  fitness. Do at least 150 minutes of moderate-intensity exercise each week, such as brisk walking or water aerobics.  When lifting objects: ? Keep your feet at least shoulder-width apart and tighten your abdominal muscles. ? Keep your spine neutral as you bend your knees and hips. It is important to lift using the strength of your legs, not your back. Do not lock your knees straight out. ? Always ask for help to lift heavy or awkward objects. Contact a health care provider if:  You have back pain or neck pain that does not get better after 6 weeks.  You have severe pain in your back, neck, legs, or arms.  You develop numbness, tingling, or weakness in any part of your body. Get help right away if:  You cannot move your arms or legs.  You cannot control when you urinate or have bowel movements.  You feel dizzy or you faint.  You have shortness of breath. This information is not intended to replace advice given to you by   your health care provider. Make sure you discuss any questions you have with your health care provider. Document Released: 12/06/2000 Document Revised: 08/05/2016 Document Reviewed: 08/05/2016 Elsevier Interactive Patient Education  2019 Elsevier Inc.  

## 2018-12-30 NOTE — Progress Notes (Signed)
Subjective:    Patient ID: Ellen Hill, female    DOB: [birth year], 48 y.o.   MRN: [id number]  No chief complaint on file.   HPI Patient was seen today for follow-up on MRI results.  Pt seen in ED on 12/23 and in clinic 12/21/18 for continued low back pain with sciatica, given Medrol-dosepak.  MRI done 12/27/2018 with disc herniation at L5/S1 with compression of left S1 nerve and foraminal narrowing.  Pt states she has was given a note at last [employer] to return to work next wk.  Since last visit, pt endorses worsening pain in back and numbness and tingling in legs.  Pt was unable to get out of bed last wk.  Pain worse with prolonged sitting.  She states she has been using her walker to get around.  Though ready to return to work,pt unsure how she will be able to get around using the walker.  Tried ibuprofen and flexeril with no relief.  Pt considering taking FMLA.  Past Medical History:  Diagnosis Date  . Abnormal Pap smear    history  . Anemia   . ASCUS (atypical squamous cells of undetermined significance) on Pap smear 2008   At 36wk of pregnancy ; colpo  . Asthma    rarely uses inhaler (seasonal)  . 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 Fibroids   . H/O candidiasis   . H/O fatigue 08/2007  . H/O varicella   . Headache(784.0)    migraines   . Heart murmur    dx child - no problems as adult  . Hx: UTI (urinary tract infection) 07/29/07  . Increased BMI   . Irregular bleeding   . Migraines   . Obesity 02/13/12  . Pelvic pain 02/13/12  . Postpartum anemia 08/18/07  . 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 after loss of parents, is fine  . Vaginal Pap smear, abnormal    f/u wnl    No Known Allergies  ROS General: Denies fever, chills, night sweats, changes in weight, changes in appetite HEENT: Denies headaches, ear pain, changes in vision, rhinorrhea, sore throat CV: Denies CP, palpitations, SOB, orthopnea Pulm: Denies SOB, cough, wheezing GI: Denies abdominal pain, nausea, vomiting,  diarrhea, constipation GU: Denies dysuria, hematuria, frequency, vaginal discharge Msk: Denies muscle cramps, joint pains  +low back pain   Neuro: +weakness, numbness, tingling in b/l LEs Skin: Denies rashes, bruising Psych: Denies depression, anxiety, hallucinations  Objective:    Blood pressure 126/82, pulse (!) 102, temperature 98.2 F (36.8 C), temperature source Oral, weight (!) 398 lb (180.5 kg), SpO2 98 %.   Gen. Pleasant, well-nourished, in no distress, obese, normal affect   HEENT: [place]/AT, face symmetric, no scleral icterus, PERRLA, nares patent without drainage Lungs: no accessory muscle use, CTAB, no wheezes or rales Cardiovascular: RRR, no m/r/g, no peripheral edema Musculoskeletal: No deformities, no cyanosis or clubbing, normal tone.  TTP of midline Lumbar spine and paraspinal muscles. Neuro:  A&Ox3, CN II-XII intact, ambulating with a walker   Wt Readings from Last 3 Encounters:  12/30/18 (!) 398 lb (180.5 kg)  12/21/18 (!) 405 lb 7 oz (183.9 kg)  12/08/18 (!) 409 lb (185.5 kg)    Lab Results  Component Value Date   WBC 5.7 04/02/2017   HGB 11.0 (L) 04/02/2017   HCT 35.5 (L) 04/02/2017   PLT 227 04/02/2017   GLUCOSE 113 (H) 07/02/2013   ALT 29 07/02/2013   AST 25 07/02/2013   NA 139 07/02/2013  K 4.2 07/02/2013   CL 104 07/02/2013   CREATININE 0.67 07/02/2013   BUN 8 07/02/2013   CO2 26 07/02/2013    Assessment/Plan:  Lumbar disc herniation with radiculopathy  -continue use of walker to aid with ambulation -consider Gabapentin for increased pain (concerned may make sleepy) and PT. -discussed importance of eating healthy and weight loss -will place referral to Neurosurg for further management. - Plan: Ambulatory referral to Neurosurgery  F/u prn  Grier Mitts, MD

## 2019-01-01 ENCOUNTER — Telehealth: Payer: Self-pay | Admitting: Family Medicine

## 2019-01-01 NOTE — Telephone Encounter (Signed)
FMLA paperwork to be filled out, placed in dr's folder. Call [phone] upon completion.

## 2019-01-03 ENCOUNTER — Encounter: Payer: Self-pay | Admitting: Family Medicine

## 2019-01-04 ENCOUNTER — Telehealth: Payer: Self-pay | Admitting: *Deleted

## 2019-01-04 NOTE — Telephone Encounter (Signed)
Copied from [place] [id number]. Topic: General - Inquiry >> Jan 04, 2019  9:01 AM Ellen Hill E wrote: Reason for CRM: Pt was written to go back to work today. Pt's employer will not except the note due to the Pt accommodations or instructions not being included. Please advise Pt if a new note can be done with the accommodations or special instructions asap. Pt also inquired about the status of her Neurology referral and asked if she still needs to come in for her appt on Friday 01.17.2020 / please advise

## 2019-01-04 NOTE — Telephone Encounter (Signed)
Please Advise

## 2019-01-05 NOTE — Telephone Encounter (Signed)
FMLA papers received and been given to Dr Volanda Napoleon for completing

## 2019-01-07 NOTE — Telephone Encounter (Signed)
Pt called to inquire about FMLA papers. Has OV on 01/08/19 and would like to be able to take them home then.

## 2019-01-07 NOTE — Telephone Encounter (Signed)
FYI

## 2019-01-08 ENCOUNTER — Ambulatory Visit: Payer: BC Managed Care – PPO | Admitting: Family Medicine

## 2019-01-08 NOTE — Telephone Encounter (Signed)
Pt Family and Medical Leave forms have been completed and faxed [place], pt is aware

## 2019-01-13 ENCOUNTER — Other Ambulatory Visit: Payer: Self-pay | Admitting: Neurosurgery

## 2019-01-13 DIAGNOSIS — M4804 Spinal stenosis, thoracic region: Secondary | ICD-10-CM

## 2019-01-14 ENCOUNTER — Ambulatory Visit
Admission: RE | Admit: 2019-01-14 | Discharge: 2019-01-14 | Disposition: A | Discharge status: Home / Self Care | Payer: BC Managed Care – PPO | Source: Ambulatory Visit | Attending: Neurosurgery | Admitting: Neurosurgery

## 2019-01-14 ENCOUNTER — Other Ambulatory Visit: Payer: Self-pay | Admitting: Family Medicine

## 2019-01-14 ENCOUNTER — Other Ambulatory Visit: Payer: Self-pay | Admitting: Neurosurgery

## 2019-01-14 DIAGNOSIS — M4804 Spinal stenosis, thoracic region: Secondary | ICD-10-CM

## 2019-01-14 DIAGNOSIS — M62830 Muscle spasm of back: Secondary | ICD-10-CM

## 2019-01-14 NOTE — Telephone Encounter (Signed)
Ok to send refills? 

## 2019-01-16 ENCOUNTER — Other Ambulatory Visit: Payer: Self-pay

## 2019-01-17 ENCOUNTER — Inpatient Hospital Stay (HOSPITAL_COMMUNITY)
Admission: EM | Admit: 2019-01-17 | Discharge: 2019-01-29 | DRG: 519 | Disposition: A | Discharge status: Rehabilitation Facility | Payer: BC Managed Care – PPO | Attending: Neurosurgery | Admitting: Neurosurgery

## 2019-01-17 ENCOUNTER — Encounter (HOSPITAL_COMMUNITY): Payer: Self-pay

## 2019-01-17 ENCOUNTER — Other Ambulatory Visit: Payer: Self-pay

## 2019-01-17 DIAGNOSIS — K592 Neurogenic bowel, not elsewhere classified: Secondary | ICD-10-CM | POA: Diagnosis present

## 2019-01-17 DIAGNOSIS — M5416 Radiculopathy, lumbar region: Secondary | ICD-10-CM

## 2019-01-17 DIAGNOSIS — M25562 Pain in left knee: Secondary | ICD-10-CM | POA: Diagnosis not present

## 2019-01-17 DIAGNOSIS — Z791 Long term (current) use of non-steroidal anti-inflammatories (NSAID): Secondary | ICD-10-CM | POA: Diagnosis not present

## 2019-01-17 DIAGNOSIS — M5117 Intervertebral disc disorders with radiculopathy, lumbosacral region: Secondary | ICD-10-CM | POA: Diagnosis present

## 2019-01-17 DIAGNOSIS — J45909 Unspecified asthma, uncomplicated: Secondary | ICD-10-CM | POA: Diagnosis present

## 2019-01-17 DIAGNOSIS — G9520 Unspecified cord compression: Secondary | ICD-10-CM | POA: Diagnosis not present

## 2019-01-17 DIAGNOSIS — W19XXXA Unspecified fall, initial encounter: Secondary | ICD-10-CM | POA: Diagnosis not present

## 2019-01-17 DIAGNOSIS — E669 Obesity, unspecified: Secondary | ICD-10-CM | POA: Diagnosis not present

## 2019-01-17 DIAGNOSIS — D72823 Leukemoid reaction: Secondary | ICD-10-CM | POA: Diagnosis not present

## 2019-01-17 DIAGNOSIS — Z8249 Family history of ischemic heart disease and other diseases of the circulatory system: Secondary | ICD-10-CM | POA: Diagnosis not present

## 2019-01-17 DIAGNOSIS — N319 Neuromuscular dysfunction of bladder, unspecified: Secondary | ICD-10-CM | POA: Diagnosis present

## 2019-01-17 DIAGNOSIS — G8220 Paraplegia, unspecified: Secondary | ICD-10-CM | POA: Diagnosis present

## 2019-01-17 DIAGNOSIS — Z79891 Long term (current) use of opiate analgesic: Secondary | ICD-10-CM

## 2019-01-17 DIAGNOSIS — D638 Anemia in other chronic diseases classified elsewhere: Secondary | ICD-10-CM | POA: Diagnosis not present

## 2019-01-17 DIAGNOSIS — Z6841 Body Mass Index (BMI) 40.0 and over, adult: Secondary | ICD-10-CM

## 2019-01-17 DIAGNOSIS — Z79899 Other long term (current) drug therapy: Secondary | ICD-10-CM | POA: Diagnosis not present

## 2019-01-17 DIAGNOSIS — B9620 Unspecified Escherichia coli [E. coli] as the cause of diseases classified elsewhere: Secondary | ICD-10-CM | POA: Diagnosis not present

## 2019-01-17 DIAGNOSIS — M4714 Other spondylosis with myelopathy, thoracic region: Secondary | ICD-10-CM | POA: Diagnosis present

## 2019-01-17 DIAGNOSIS — R300 Dysuria: Secondary | ICD-10-CM | POA: Diagnosis not present

## 2019-01-17 DIAGNOSIS — M48061 Spinal stenosis, lumbar region without neurogenic claudication: Secondary | ICD-10-CM | POA: Diagnosis not present

## 2019-01-17 DIAGNOSIS — N390 Urinary tract infection, site not specified: Secondary | ICD-10-CM | POA: Diagnosis not present

## 2019-01-17 DIAGNOSIS — M4804 Spinal stenosis, thoracic region: Secondary | ICD-10-CM | POA: Diagnosis not present

## 2019-01-17 DIAGNOSIS — Z87891 Personal history of nicotine dependence: Secondary | ICD-10-CM

## 2019-01-17 DIAGNOSIS — Z419 Encounter for procedure for purposes other than remedying health state, unspecified: Secondary | ICD-10-CM

## 2019-01-17 DIAGNOSIS — Z79818 Long term (current) use of other agents affecting estrogen receptors and estrogen levels: Secondary | ICD-10-CM

## 2019-01-17 DIAGNOSIS — Z4789 Encounter for other orthopedic aftercare: Secondary | ICD-10-CM | POA: Diagnosis not present

## 2019-01-17 DIAGNOSIS — G959 Disease of spinal cord, unspecified: Secondary | ICD-10-CM | POA: Diagnosis not present

## 2019-01-17 DIAGNOSIS — D72829 Elevated white blood cell count, unspecified: Secondary | ICD-10-CM | POA: Diagnosis not present

## 2019-01-17 DIAGNOSIS — A499 Bacterial infection, unspecified: Secondary | ICD-10-CM | POA: Diagnosis not present

## 2019-01-17 DIAGNOSIS — M545 Low back pain: Secondary | ICD-10-CM | POA: Diagnosis present

## 2019-01-17 LAB — BASIC METABOLIC PANEL
Anion gap: 8 (ref 5–15)
BUN: 14 mg/dL (ref 6–20)
CO2: 21 mmol/L — ABNORMAL LOW (ref 22–32)
Calcium: 9 mg/dL (ref 8.9–10.3)
Chloride: 113 mmol/L — ABNORMAL HIGH (ref 98–111)
Creatinine, Ser: 0.83 mg/dL (ref 0.44–1.00)
GFR calc Af Amer: >60 mL/min (ref >60)
GFR calc non Af Amer: >60 mL/min (ref >60)
Glucose, Bld: 85 mg/dL (ref 70–99)
Potassium: 3.9 mmol/L (ref 3.5–5.1)
Sodium: 142 mmol/L (ref 135–145)

## 2019-01-17 LAB — CBC
HCT: 37.2 % (ref 36.0–46.0)
Hemoglobin: 10.9 g/dL — ABNORMAL LOW (ref 12.0–15.0)
MCH: 23.2 pg — ABNORMAL LOW (ref 26.0–34.0)
MCHC: 29.3 g/dL — ABNORMAL LOW (ref 30.0–36.0)
MCV: 79.1 fL — ABNORMAL LOW (ref 80.0–100.0)
Platelets: 218 10*3/uL (ref 150–400)
RBC: 4.7 MIL/uL (ref 3.87–5.11)
RDW: 17.2 % — ABNORMAL HIGH (ref 11.5–15.5)
WBC: 6.2 10*3/uL (ref 4.0–10.5)
nRBC: 0 % (ref 0.0–0.2)

## 2019-01-17 LAB — MRSA PCR SCREENING: MRSA by PCR: NEGATIVE

## 2019-01-17 MED ORDER — SODIUM CHLORIDE 0.9% FLUSH: 3.0000 mL | INTRAVENOUS | Status: DC | PRN

## 2019-01-17 MED ORDER — HYDROCODONE-ACETAMINOPHEN 5-325 MG PO TABS
1.0000 | ORAL_TABLET | ORAL | Status: DC | PRN
  Administered 2019-01-19 – 2019-01-28 (×7): 2 via ORAL
  Filled 2019-01-17 (×7): qty 2

## 2019-01-17 MED ORDER — SODIUM CHLORIDE 0.9% FLUSH
3.0000 mL | Freq: Two times a day (BID) | INTRAVENOUS | Status: DC
  Administered 2019-01-17 – 2019-01-28 (×19): 3 mL via INTRAVENOUS

## 2019-01-17 MED ORDER — BISACODYL 5 MG PO TBEC
5.0000 mg | DELAYED_RELEASE_TABLET | Freq: Every day | ORAL | Status: DC | PRN
  Administered 2019-01-22: 5 mg via ORAL
  Filled 2019-01-17: qty 1

## 2019-01-17 MED ORDER — SODIUM CHLORIDE 0.9 % IV SOLN
INTRAVENOUS | Status: DC
  Administered 2019-01-19 – 2019-01-24 (×7): via INTRAVENOUS

## 2019-01-17 MED ORDER — ONDANSETRON HCL 4 MG PO TABS: 4.0000 mg | ORAL_TABLET | Freq: Four times a day (QID) | ORAL | Status: DC | PRN

## 2019-01-17 MED ORDER — METHOCARBAMOL 500 MG PO TABS: 500.0000 mg | ORAL_TABLET | Freq: Four times a day (QID) | ORAL | Status: DC | PRN

## 2019-01-17 MED ORDER — METHOCARBAMOL 1000 MG/10ML IJ SOLN
500.0000 mg | Freq: Four times a day (QID) | INTRAVENOUS | Status: DC | PRN
  Filled 2019-01-17: qty 5

## 2019-01-17 MED ORDER — MEGESTROL ACETATE 40 MG PO TABS
40.0000 mg | ORAL_TABLET | Freq: Two times a day (BID) | ORAL | Status: DC
  Administered 2019-01-17 – 2019-01-29 (×23): 40 mg via ORAL
  Filled 2019-01-17 (×25): qty 1

## 2019-01-17 MED ORDER — CYCLOBENZAPRINE HCL 10 MG PO TABS
10.0000 mg | ORAL_TABLET | Freq: Three times a day (TID) | ORAL | Status: DC
  Administered 2019-01-17 – 2019-01-29 (×34): 10 mg via ORAL
  Filled 2019-01-17 (×33): qty 1

## 2019-01-17 MED ORDER — SENNOSIDES-DOCUSATE SODIUM 8.6-50 MG PO TABS
1.0000 | ORAL_TABLET | Freq: Every evening | ORAL | Status: DC | PRN
  Administered 2019-01-21 – 2019-01-22 (×2): 1 via ORAL
  Filled 2019-01-17 (×2): qty 1

## 2019-01-17 MED ORDER — MORPHINE SULFATE (PF) 4 MG/ML IV SOLN
4.0000 mg | Freq: Once | INTRAVENOUS | Status: AC
  Administered 2019-01-17: 4 mg via INTRAVENOUS
  Filled 2019-01-17: qty 1

## 2019-01-17 MED ORDER — ACETAMINOPHEN 325 MG PO TABS
650.0000 mg | ORAL_TABLET | Freq: Four times a day (QID) | ORAL | Status: DC | PRN
  Administered 2019-01-17 – 2019-01-18 (×2): 650 mg via ORAL
  Filled 2019-01-17 (×2): qty 2

## 2019-01-17 MED ORDER — SODIUM CHLORIDE 0.9 % IV SOLN: 250.0000 mL | INTRAVENOUS | Status: DC | PRN

## 2019-01-17 MED ORDER — ACETAMINOPHEN 650 MG RE SUPP: 650.0000 mg | Freq: Four times a day (QID) | RECTAL | Status: DC | PRN

## 2019-01-17 MED ORDER — HYDROMORPHONE HCL 1 MG/ML IJ SOLN
1.0000 mg | INTRAMUSCULAR | Status: DC | PRN
  Filled 2019-01-17: qty 1

## 2019-01-17 MED ORDER — DOCUSATE SODIUM 100 MG PO CAPS
100.0000 mg | ORAL_CAPSULE | Freq: Two times a day (BID) | ORAL | Status: DC
  Administered 2019-01-17 – 2019-01-27 (×15): 100 mg via ORAL
  Filled 2019-01-17 (×17): qty 1

## 2019-01-17 MED ORDER — KETOROLAC TROMETHAMINE 15 MG/ML IJ SOLN
15.0000 mg | Freq: Four times a day (QID) | INTRAMUSCULAR | Status: AC | PRN
  Administered 2019-01-17 – 2019-01-18 (×3): 15 mg via INTRAVENOUS
  Filled 2019-01-17 (×3): qty 1

## 2019-01-17 MED ORDER — FLEET ENEMA 7-19 GM/118ML RE ENEM: 1.0000 | ENEMA | Freq: Once | RECTAL | Status: DC | PRN

## 2019-01-17 MED ORDER — ONDANSETRON HCL 4 MG/2ML IJ SOLN: 4.0000 mg | Freq: Four times a day (QID) | INTRAMUSCULAR | Status: DC | PRN

## 2019-01-17 MED ORDER — DEXAMETHASONE SODIUM PHOSPHATE 10 MG/ML IJ SOLN
10.0000 mg | Freq: Once | INTRAMUSCULAR | Status: AC
  Administered 2019-01-17: 10 mg via INTRAVENOUS
  Filled 2019-01-17: qty 1

## 2019-01-17 MED ORDER — OXYCODONE HCL 5 MG PO TABS
5.0000 mg | ORAL_TABLET | ORAL | Status: DC | PRN
  Administered 2019-01-17 – 2019-01-24 (×4): 10 mg via ORAL
  Administered 2019-01-26: 5 mg via ORAL
  Administered 2019-01-28 – 2019-01-29 (×2): 10 mg via ORAL
  Filled 2019-01-17 (×4): qty 2
  Filled 2019-01-17: qty 1
  Filled 2019-01-17 (×2): qty 2

## 2019-01-17 NOTE — ED Notes (Signed)
[facility] called for pt 

## 2019-01-17 NOTE — H&P (Addendum)
Chief Complaint   Chief Complaint  Patient presents with  . Back Pain    HPI   Consult requested by: Candice Camp Reason for consult: thoracic myelopathy, LBP, gait instability  HPI: Ellen Hill is a 48 y.o. female, patient of Dr Annette Stable who presented to the ER with worsening gait instability, LBP and RLE weakness/pain. Symptoms have been progressively worsening over the past 2.5 months. She was diagnosed with lumbar radiculopathy by her PCP and underwent a lumbar MRI. She was recently seen in the office and an MRI of her thoracic spine was ordered due to thoracic stenosis seen on the lumbar MRI. She underwent MRI T spine 01/14/19 and had scheduled f/u on Tuesday but pain was too severe and weakness caused her to fall over the weekend without injury so she came to ER. She denies bowel/bladder dysfunction, LLE symptoms.   Patient Active Problem List   Diagnosis Date Noted  . 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 Lumbar back pain with radiculopathy affecting lower extremity 12/08/2018  . Dysmenorrhea 04/13/2012  . Class 3 severe obesity due to excess calories without serious comorbidity with body mass index (BMI) of 60.0 to 69.9 in adult ([place]) 04/13/2012  . Anemia 04/13/2012  . Fibroids 04/13/2012  . Menorrhagia 03/07/2012    PMH: Past Medical History:  Diagnosis Date  . Abnormal Pap smear    history  . Anemia   . ASCUS (atypical squamous cells of undetermined significance) on Pap smear 2008   At 36wk of pregnancy ; colpo  . Asthma    rarely uses inhaler (seasonal)  . 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 Fibroids   . H/O candidiasis   . H/O fatigue 08/2007  . H/O varicella   . Headache(784.0)    migraines   . Heart murmur    dx child - no problems as adult  . Hx: UTI (urinary tract infection) 07/29/07  . Increased BMI   . Irregular bleeding   . Migraines   . Obesity 02/13/12  . Pelvic pain 02/13/12  . Postpartum anemia 08/18/07  . 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  after loss of parents, is fine  . Vaginal Pap smear, abnormal    f/u wnl    PSH: Past Surgical History:  Procedure Laterality Date  . CERVICAL POLYPECTOMY  05/01/2012   Procedure: CERVICAL POLYPECTOMY;  Surgeon: Betsy Coder, MD;  Location: [facility];  Service: Gynecology;  Laterality: N/A;  . CESAREAN SECTION  07/04/2007  . CHOLECYSTECTOMY  07/06/2013  . CHOLECYSTECTOMY N/A 07/06/2013   Procedure: LAPAROSCOPIC CHOLECYSTECTOMY ;  Surgeon: Gayland Curry, MD;  Location: [facility];  Service: General;  Laterality: N/A;  attempted cholangiogram  . fibroidecto    . fibroidectomy  07/04/2007  . MYOMECTOMY  07/04/08  . OPERATIVE HYSTEROSCOPY  05/01/2012   no menses since that time  . TUBAL LIGATION  07/04/2007    Medications Prior to Admission  Medication Sig Dispense Refill Last Dose  . cyclobenzaprine (FLEXERIL) 10 MG tablet Take 10 mg by mouth every 8 (eight) hours.   01/16/2019 at Unknown time  . HYDROcodone-acetaminophen (NORCO/VICODIN) 5-325 MG tablet Take 1 tablet by mouth every 6 (six) hours.   01/16/2019 at Unknown time  . ibuprofen (ADVIL,MOTRIN) 800 MG tablet Take 800 mg by mouth 3 (three) times daily as needed for moderate pain.    Past Week at Unknown time  . megestrol (MEGACE) 40 MG tablet Take 1 tablet (40 mg total) by mouth 2 (two) times  daily. Can increase to two tablets twice a day in the event of heavy bleeding (Patient taking differently: Take 40 mg by mouth daily. ) 60 tablet 3 01/16/2019 at Unknown time  . naproxen (NAPROSYN) 500 MG tablet Take 1 tablet (500 mg total) by mouth 2 (two) times daily. 20 tablet 0 Past Month at Unknown time  . cyclobenzaprine (FLEXERIL) 5 MG tablet TAKE 1 TABLET BY MOUTH THREE TIMES A DAY AS NEEDED FOR MUSCLE SPASMS (Patient not taking: No sig reported) 30 tablet 0 Not Taking at Unknown time  . ibuprofen (ADVIL,MOTRIN) 600 MG tablet Take 1 tablet (600 mg total) by mouth every 6 (six) hours as needed. (Patient not taking: Reported on 01/17/2019) 30 tablet 0  Not Taking at Unknown time  . methylPREDNISolone (MEDROL DOSEPAK) 4 MG TBPK tablet As directed (Patient not taking: Reported on 01/17/2019) 21 tablet 0 Not Taking at Unknown time    SH: Social History   Tobacco Use  . Smoking status: Former Smoker    Packs/day: 0.30    Years: 1.00    Pack years: 0.30    Types: Cigarettes    Last attempt to quit: 03/18/1999    Years since quitting: 19.8  . Smokeless tobacco: Never Used  Substance Use Topics  . Alcohol use: No  . Drug use: No    MEDS: Prior to Admission medications   Medication Sig Start Date End Date Taking? Authorizing Provider  cyclobenzaprine (FLEXERIL) 10 MG tablet Take 10 mg by mouth every 8 (eight) hours. 01/14/19  Yes [provider]  HYDROcodone-acetaminophen (NORCO/VICODIN) 5-325 MG tablet Take 1 tablet by mouth every 6 (six) hours. 01/14/19  Yes [provider]  ibuprofen (ADVIL,MOTRIN) 800 MG tablet Take 800 mg by mouth 3 (three) times daily as needed for moderate pain.  12/29/18  Yes [provider]  megestrol (MEGACE) 40 MG tablet Take 1 tablet (40 mg total) by mouth 2 (two) times daily. Can increase to two tablets twice a day in the event of heavy bleeding Patient taking differently: Take 40 mg by mouth daily.  07/21/17  Yes Lavonia Drafts, MD  naproxen (NAPROSYN) 500 MG tablet Take 1 tablet (500 mg total) by mouth 2 (two) times daily. 12/14/18  Yes Plunkett, Loree Fee, MD  cyclobenzaprine (FLEXERIL) 5 MG tablet TAKE 1 TABLET BY MOUTH THREE TIMES A DAY AS NEEDED FOR MUSCLE SPASMS Patient not taking: No sig reported 01/15/19   Billie Ruddy, MD  ibuprofen (ADVIL,MOTRIN) 600 MG tablet Take 1 tablet (600 mg total) by mouth every 6 (six) hours as needed. Patient not taking: Reported on 01/17/2019 04/02/17   Laury Deep, CNM  methylPREDNISolone (MEDROL DOSEPAK) 4 MG TBPK tablet As directed Patient not taking: Reported on 01/17/2019 12/21/18   Laurey Morale, MD    ALLERGY: No Known  Allergies  Social History   Tobacco Use  . Smoking status: Former Smoker    Packs/day: 0.30    Years: 1.00    Pack years: 0.30    Types: Cigarettes    Last attempt to quit: 03/18/1999    Years since quitting: 19.8  . Smokeless tobacco: Never Used  Substance Use Topics  . Alcohol use: No     Family History  Problem Relation Age of Onset  . Hypertension Mother   . Cancer Mother        Breast  . Breast cancer Mother   . Hypertension Father   . Cancer Father        Prostate  .  Heart attack Father   . Hypertension Sister   . Diabetes Maternal Aunt      ROS   Review of Systems  Constitutional: Positive for malaise/fatigue. Negative for chills and fever.  HENT: Negative.   Eyes: Negative.   Respiratory: Negative.   Cardiovascular: Negative.   Gastrointestinal: Negative.   Genitourinary: Negative.   Musculoskeletal: Positive for back pain, falls and myalgias.  Skin: Negative.   Neurological: Positive for tingling (RLE), sensory change (RLE) and weakness (RLE). Negative for dizziness, tremors, speech change, focal weakness, seizures, loss of consciousness and headaches.    Exam   Vitals:   01/17/19 2300 01/18/19 0300  BP: (!) 154/79 138/86  Pulse: 96 95  Resp:    Temp: 98.5 F (36.9 C) 98.8 F (37.1 C)  SpO2: 100% 100%   General appearance: WDWN, resting comfortably Eyes: No scleral injection Cardiovascular: Regular rate and rhythm without murmurs, rubs, gallops. No edema or variciosities. Distal pulses normal. Pulmonary: Effort normal, non-labored breathing Musculoskeletal:     Muscle tone upper extremities: Normal    Muscle tone lower extremities: Normal    Motor exam: 5/5 BUE, LLE 5/5, RLE 4-/5 (poor effort) Neurological Mental Status:    - Patient is awake, alert, oriented to person, place, month, year, and situation    - Patient is able to give a clear and coherent history.    - No signs of aphasia or neglect Cranial Nerves    - II: Visual Fields are  full. PERRL    - III/IV/VI: EOMI without ptosis or diploplia.     - V: Facial sensation is grossly normal    - VII: Facial movement is symmetric.     - VIII: hearing is intact to voice    - X: Uvula elevates symmetrically    - XI: Shoulder shrug is symmetric.    - XII: tongue is midline without atrophy or fasciculations.  Sensory: Sensation grossly intact to LT LLE, decreased sensation RLE  Results - Imaging/Labs   Results for orders placed or performed during the hospital encounter of 01/17/19 (from the past 48 hour(s))  Basic metabolic panel     Status: Abnormal   Collection Time: 01/17/19  2:46 PM  Result Value Ref Range   Sodium 142 135 - 145 mmol/L   Potassium 3.9 3.5 - 5.1 mmol/L   Chloride 113 (H) 98 - 111 mmol/L   CO2 21 (L) 22 - 32 mmol/L   Glucose, Bld 85 70 - 99 mg/dL   BUN 14 6 - 20 mg/dL   Creatinine, Ser 0.83 0.44 - 1.00 mg/dL   Calcium 9.0 8.9 - 10.3 mg/dL   GFR calc non Af Amer >60 >60 mL/min   GFR calc Af Amer >60 >60 mL/min   Anion gap 8 5 - 15    Comment: Performed at [facility], [place] [address], [place], [place] [address]  CBC     Status: Abnormal   Collection Time: 01/17/19  2:46 PM  Result Value Ref Range   WBC 6.2 4.0 - 10.5 K/uL   RBC 4.70 3.87 - 5.11 MIL/uL   Hemoglobin 10.9 (L) 12.0 - 15.0 g/dL   HCT 37.2 36.0 - 46.0 %   MCV 79.1 (L) 80.0 - 100.0 fL   MCH 23.2 (L) 26.0 - 34.0 pg   MCHC 29.3 (L) 30.0 - 36.0 g/dL   RDW 17.2 (H) 11.5 - 15.5 %   Platelets 218 150 - 400 K/uL   nRBC 0.0 0.0 -  0.2 %    Comment: Performed at [facility], [place] [address], [place], [place] [address]  MRSA PCR Screening     Status: None   Collection Time: 01/17/19  6:38 PM  Result Value Ref Range   MRSA by PCR NEGATIVE NEGATIVE    Comment:        The GeneXpert MRSA Assay (FDA approved for NASAL specimens only), is one component of a comprehensive MRSA colonization surveillance program. It is not intended to diagnose  MRSA infection nor to guide or monitor treatment for MRSA infections. Performed at [facility], [place] [address], [place], [place] [address]     No results found.  Impression/Plan   48 y.o. female with progressively worsening lower back pain, gait instability and RLE pain/weakness/numbness over the past 2.5 months. She has RLE weakness but question effort put forth vs true weakness. MRI T spine shows multilevel thoracic stenosis with cord signal changes. Will admit for pain control and possible surgical intervention in the coming week. Will advise Dr Annette Stable of her admission.

## 2019-01-17 NOTE — Progress Notes (Signed)
Called back to get report.

## 2019-01-17 NOTE — ED Triage Notes (Signed)
Pt states she is having right lower back pain for 2.5 months. Pt states that the pain is now causing her difficulty walking.

## 2019-01-17 NOTE — ED Notes (Signed)
Attempt report x1  

## 2019-01-17 NOTE — ED Provider Notes (Signed)
[facility] DEPT Provider Note   CSN: [id number] Arrival date & time: 01/17/19  1221     History   Chief Complaint Chief Complaint  Patient presents with  . Back Pain    HPI Ellen Hill is a 48 y.o. female w PMHx lumbar radiculopathy, severe obesity, multiple asthma, presenting to the emergency department with complaint of vaginally worsening low back pain and paresthesias to the right leg.  She has been having right-sided low back pain for 2-1/2 months, followed by Dr. Annette Stable with neurosurgery. Recent outpatient MRIs of the L-spine and T-spine this month.  L-spine MRI showing L5-S1 disc herniation, L4-L5 foraminal stenosis.  T-spine MRI showing significant stenosis throughout T-spine.  Patient states she has not followed up with Dr. Annette Stable regarding the T-spine results, her appointment is this Tuesday.   She reports today with gradually worsening numbness sensation to her right leg that has been causing her to fall.  She states pain shoots down the leg as well.  She lives on the second story and has to climb a flight of stairs to get into her home and has been having significant trouble ambulating and getting in and out of her home.  States she had to call some men from church to help her out of her house today.  Had a fall yesterday without new injury. States medrol dosepak provided relief of symptoms, finished a couple of weeks ago. Has been taking prescribed hydrocodone and flexeril without enough relief. Pain is rated 7/10 severity. No new incontinence.   The history is provided by the patient and medical records.    Past Medical History:  Diagnosis Date  . Abnormal Pap smear    history  . Anemia   . ASCUS (atypical squamous cells of undetermined significance) on Pap smear 2008   At 36wk of pregnancy ; colpo  . Asthma    rarely uses inhaler (seasonal)  . 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 Fibroids   . H/O candidiasis   . H/O fatigue 08/2007    . H/O varicella   . Headache(784.0)    migraines   . Heart murmur    dx child - no problems as adult  . Hx: UTI (urinary tract infection) 07/29/07  . Increased BMI   . Irregular bleeding   . Migraines   . Obesity 02/13/12  . Pelvic pain 02/13/12  . Postpartum anemia 08/18/07  . 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 after loss of parents, is fine  . Vaginal Pap smear, abnormal    f/u wnl    Patient Active Problem List   Diagnosis Date Noted  . 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 Lumbar back pain with radiculopathy affecting lower extremity 12/08/2018  . Dysmenorrhea 04/13/2012  . Class 3 severe obesity due to excess calories without serious comorbidity with body mass index (BMI) of 60.0 to 69.9 in adult ([place]) 04/13/2012  . Anemia 04/13/2012  . Fibroids 04/13/2012  . Menorrhagia 03/07/2012    Past Surgical History:  Procedure Laterality Date  . CERVICAL POLYPECTOMY  05/01/2012   Procedure: CERVICAL POLYPECTOMY;  Surgeon: Betsy Coder, MD;  Location: [facility];  Service: Gynecology;  Laterality: N/A;  . CESAREAN SECTION  07/04/2007  . CHOLECYSTECTOMY  07/06/2013  . CHOLECYSTECTOMY N/A 07/06/2013   Procedure: LAPAROSCOPIC CHOLECYSTECTOMY ;  Surgeon: Gayland Curry, MD;  Location: [facility];  Service: General;  Laterality: N/A;  attempted cholangiogram  . fibroidecto    . fibroidectomy  07/04/2007  .  MYOMECTOMY  07/04/08  . OPERATIVE HYSTEROSCOPY  05/01/2012   no menses since that time  . TUBAL LIGATION  07/04/2007     OB History    Gravida  4   Para  1   Term  0   Preterm  1   AB  3   Living  1     SAB  0   TAB  3   Ectopic  0   Multiple  0   Live Births  1            Home Medications    Prior to Admission medications   Medication Sig Start Date End Date Taking? Authorizing Provider  cyclobenzaprine (FLEXERIL) 10 MG tablet Take 10 mg by mouth every 8 (eight) hours. 01/14/19  Yes [provider]  HYDROcodone-acetaminophen (NORCO/VICODIN) 5-325 MG tablet  Take 1 tablet by mouth every 6 (six) hours. 01/14/19  Yes [provider]  ibuprofen (ADVIL,MOTRIN) 800 MG tablet Take 800 mg by mouth 3 (three) times daily as needed for moderate pain.  12/29/18  Yes [provider]  megestrol (MEGACE) 40 MG tablet Take 1 tablet (40 mg total) by mouth 2 (two) times daily. Can increase to two tablets twice a day in the event of heavy bleeding Patient taking differently: Take 40 mg by mouth daily.  07/21/17  Yes Lavonia Drafts, MD  naproxen (NAPROSYN) 500 MG tablet Take 1 tablet (500 mg total) by mouth 2 (two) times daily. 12/14/18  Yes Plunkett, Loree Fee, MD  cyclobenzaprine (FLEXERIL) 5 MG tablet TAKE 1 TABLET BY MOUTH THREE TIMES A DAY AS NEEDED FOR MUSCLE SPASMS Patient not taking: No sig reported 01/15/19   Billie Ruddy, MD  ibuprofen (ADVIL,MOTRIN) 600 MG tablet Take 1 tablet (600 mg total) by mouth every 6 (six) hours as needed. Patient not taking: Reported on 01/17/2019 04/02/17   Laury Deep, CNM  methylPREDNISolone (MEDROL DOSEPAK) 4 MG TBPK tablet As directed Patient not taking: Reported on 01/17/2019 12/21/18   Laurey Morale, MD    Family History Family History  Problem Relation Age of Onset  . Hypertension Mother   . Cancer Mother        Breast  . Breast cancer Mother   . Hypertension Father   . Cancer Father        Prostate  . Heart attack Father   . Hypertension Sister   . Diabetes Maternal Aunt     Social History Social History   Tobacco Use  . Smoking status: Former Smoker    Packs/day: 0.30    Years: 1.00    Pack years: 0.30    Types: Cigarettes    Last attempt to quit: 03/18/1999    Years since quitting: 19.8  . Smokeless tobacco: Never Used  Substance Use Topics  . Alcohol use: No  . Drug use: No     Allergies   Patient has no known allergies.   Review of Systems Review of Systems  Musculoskeletal: Positive for back pain.  Neurological: Positive for weakness and numbness.  All other  systems reviewed and are negative.    Physical Exam Updated Vital Signs BP (!) 170/100   Pulse 98   Temp 98.3 F (36.8 C) (Oral)   Resp 20   Ht 5\' 6"  (1.676 m)   Wt (!) 170.1 kg   SpO2 100%   BMI 60.53 kg/m   Physical Exam Vitals signs and nursing note reviewed.  Constitutional:      General:  She is not in acute distress.    Appearance: She is well-developed.     Comments: Morbidly obese.  HENT:     Head: Normocephalic and atraumatic.  Eyes:     Conjunctiva/sclera: Conjunctivae normal.  Cardiovascular:     Rate and Rhythm: Normal rate and regular rhythm.  Pulmonary:     Effort: Pulmonary effort is normal.     Breath sounds: Normal breath sounds.  Abdominal:     General: Bowel sounds are normal.     Tenderness: There is no abdominal tenderness.  Musculoskeletal:     Comments: No midline spinal tenderness.  Neurological:     Mental Status: She is alert.     Comments: Patient with decreased effort on neuro exam, however with encouragement patient does appear to have equal strength to bilateral lower extremities with dorsi and plantar flexion.  Reports decreased sensation to the right leg versus the left.   Psychiatric:        Mood and Affect: Mood normal.        Behavior: Behavior normal.      ED Treatments / Results  Labs (all labs ordered are listed, but only abnormal results are displayed) Labs Reviewed  BASIC METABOLIC PANEL - Abnormal; Notable for the following components:      Result Value   Chloride 113 (*)    CO2 21 (*)    All other components within normal limits  CBC - Abnormal; Notable for the following components:   Hemoglobin 10.9 (*)    MCV 79.1 (*)    MCH 23.2 (*)    MCHC 29.3 (*)    RDW 17.2 (*)    All other components within normal limits  MRSA PCR SCREENING  HIV ANTIBODY (ROUTINE TESTING W REFLEX)    EKG None  Radiology No results found.  Procedures Procedures (including critical care time)  Medications Ordered in  ED Medications  megestrol (MEGACE) tablet 40 mg (has no administration in time range)  cyclobenzaprine (FLEXERIL) tablet 10 mg (has no administration in time range)  0.9 %  sodium chloride infusion (has no administration in time range)  sodium chloride flush (NS) 0.9 % injection 3 mL (has no administration in time range)  sodium chloride flush (NS) 0.9 % injection 3 mL (has no administration in time range)  0.9 %  sodium chloride infusion (has no administration in time range)  ondansetron (ZOFRAN) tablet 4 mg (has no administration in time range)    Or  ondansetron (ZOFRAN) injection 4 mg (has no administration in time range)  sodium phosphate (FLEET) 7-19 GM/118ML enema 1 enema (has no administration in time range)  bisacodyl (DULCOLAX) EC tablet 5 mg (has no administration in time range)  senna-docusate (Senokot-S) tablet 1 tablet (has no administration in time range)  docusate sodium (COLACE) capsule 100 mg (has no administration in time range)  HYDROcodone-acetaminophen (NORCO/VICODIN) 5-325 MG per tablet 1-2 tablet (has no administration in time range)  acetaminophen (TYLENOL) tablet 650 mg (has no administration in time range)    Or  acetaminophen (TYLENOL) suppository 650 mg (has no administration in time range)  oxyCODONE (Oxy IR/ROXICODONE) immediate release tablet 5-10 mg (has no administration in time range)  HYDROmorphone (DILAUDID) injection 1 mg (has no administration in time range)  morphine 4 MG/ML injection 4 mg (4 mg Intravenous Given 01/17/19 1718)  dexamethasone (DECADRON) injection 10 mg (10 mg Intravenous Given 01/17/19 1718)     Initial Impression / Assessment and Plan / ED Course  I  have reviewed the triage vital signs and the nursing notes.  Pertinent labs & imaging results that were available during my care of the patient were reviewed by me and considered in my medical decision making (see chart for details).  Clinical Course as of Jan 18 1840  Nancy Fetter Jan 17, 2019  1444 Patient discussed with Lyndle Herrlich with neurosurgery.  Will admit to [facility] for further management.   [JR]    Clinical Course User Index [JR] , [place] N, PA-C    Patient presenting with gradually worsening pain to her low back and paresthesias to the right leg.  Outpatient MRIs of the L and T-spine revealing disc herniation and significant stenosis.  Followed by Dr. Annette Stable with neurosurgery, her follow-up appointment is on Tuesday.  Patient having significant difficulty ambulating at home, lives on the second story and is unable to climb the stairs.  Pain not sufficiently controlled with prescribed hydrocodone and Flexeril.  On neuro exam, patient displays decreased effort, however able to encourage equal strength out of both extremities.  She does report sensory deficit to the right leg compared to the left.  Patient discussed with neurosurgery PA, who will admit to [facility] for further management.  Patient stable for transfer.  Patient discussed with Dr. Ellender Hose who agrees with care plan.  Final Clinical Impressions(s) / ED Diagnoses   Final diagnoses:  Lumbar radiculopathy    ED Discharge Orders    None       , [place] N, PA-C 01/17/19 Ward Chatters    Duffy Bruce, MD 01/18/19 [phone]

## 2019-01-17 NOTE — ED Notes (Signed)
ED TO INPATIENT HANDOFF REPORT  Name/Age/Gender Ellen Hill 48 y.o. female  Code Status    Code Status Orders  (From admission, onward)         Start     Ordered   01/17/19 1503  Full code  Continuous     01/17/19 1503        Code Status History    Date Active Date Inactive Code Status Order ID Comments User Context   07/06/2013 1533 07/07/2013 1251 Full Code [id number]  Gayland Curry, MD Inpatient      Home/SNF/Other Home  Chief Complaint weakness  Level of Care/Admitting Diagnosis ED Disposition    ED Disposition Condition [facility]: [place] [[id number]]  Level of Care: Med-Surg [16]  Diagnosis: Thoracic myelopathy [627035]  Admitting Physician: Consuella Lose [[id number]]  Attending Physician: Earnie Larsson [phone]  Estimated length of stay: 5 - 7 days  Certification:: I certify this patient will need inpatient services for at least 2 midnights  Bed request comments: 3W or 4NP  PT Class (Do Not Modify): Inpatient [101]  PT Acc Code (Do Not Modify): Private [1]       Medical History Past Medical History:  Diagnosis Date  . Abnormal Pap smear    history  . Anemia   . ASCUS (atypical squamous cells of undetermined significance) on Pap smear 2008   At 36wk of pregnancy ; colpo  . Asthma    rarely uses inhaler (seasonal)  . 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 Fibroids   . H/O candidiasis   . H/O fatigue 08/2007  . H/O varicella   . Headache(784.0)    migraines   . Heart murmur    dx child - no problems as adult  . Hx: UTI (urinary tract infection) 07/29/07  . Increased BMI   . Irregular bleeding   . Migraines   . Obesity 02/13/12  . Pelvic pain 02/13/12  . Postpartum anemia 08/18/07  . 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 after loss of parents, is fine  . Vaginal Pap smear, abnormal    f/u wnl    Allergies No Known Allergies  IV Location/Drains/Wounds Patient Lines/Drains/Airways Status   Active  Line/Drains/Airways    Name:   Placement date:   Placement time:   Site:   Days:   Incision 05/01/12 Perineum Other (Comment)   05/01/12    0956     2452   Incision 07/06/13 Abdomen Other (Comment)   07/06/13    1306     2021   Incision - 4 Ports Abdomen Umbilicus Mid;Upper Right;Lateral Right;Medial   07/06/13    -     2021          Labs/Imaging No results found for this or any previous visit (from the past 20 hour(s)). No results found. None  Pending Labs Unresulted Labs (From admission, onward)    Start     Ordered   01/17/19 1502  HIV antibody (Routine Testing)  Once,   R     01/17/19 1503   01/17/19 7169  Basic metabolic panel  Once,   STAT     01/17/19 1445   01/17/19 1446  CBC  ONCE - STAT,   STAT     01/17/19 1445          Vitals/Pain Today's Vitals   01/17/19 1228 01/17/19 1500 01/17/19 1606 01/17/19 1646  BP: (!) 158/114 (!) 162/105  (!) 168/104  Pulse: (!) 111 (!) 102  99  Resp: 18 18  20   Temp: 98.3 F (36.8 C)     TempSrc: Oral     SpO2: 100% 100%  100%  Weight:      Height:      PainSc:   9      Isolation Precautions No active isolations  Medications Medications  morphine 4 MG/ML injection 4 mg (has no administration in time range)  dexamethasone (DECADRON) injection 10 mg (has no administration in time range)  megestrol (MEGACE) tablet 40 mg (has no administration in time range)  cyclobenzaprine (FLEXERIL) tablet 10 mg (has no administration in time range)  0.9 %  sodium chloride infusion (has no administration in time range)  sodium chloride flush (NS) 0.9 % injection 3 mL (has no administration in time range)  sodium chloride flush (NS) 0.9 % injection 3 mL (has no administration in time range)  0.9 %  sodium chloride infusion (has no administration in time range)  ondansetron (ZOFRAN) tablet 4 mg (has no administration in time range)    Or  ondansetron (ZOFRAN) injection 4 mg (has no administration in time range)  sodium phosphate (FLEET)  7-19 GM/118ML enema 1 enema (has no administration in time range)  bisacodyl (DULCOLAX) EC tablet 5 mg (has no administration in time range)  senna-docusate (Senokot-S) tablet 1 tablet (has no administration in time range)  docusate sodium (COLACE) capsule 100 mg (has no administration in time range)  HYDROcodone-acetaminophen (NORCO/VICODIN) 5-325 MG per tablet 1-2 tablet (has no administration in time range)  acetaminophen (TYLENOL) tablet 650 mg (has no administration in time range)    Or  acetaminophen (TYLENOL) suppository 650 mg (has no administration in time range)  oxyCODONE (Oxy IR/ROXICODONE) immediate release tablet 5-10 mg (has no administration in time range)  HYDROmorphone (DILAUDID) injection 1 mg (has no administration in time range)    Mobility non-ambulatory currently

## 2019-01-18 ENCOUNTER — Other Ambulatory Visit: Payer: Self-pay | Admitting: Neurosurgery

## 2019-01-18 LAB — HIV ANTIBODY (ROUTINE TESTING W REFLEX): HIV Screen 4th Generation wRfx: NONREACTIVE

## 2019-01-18 LAB — SURGICAL PCR SCREEN
MRSA, PCR: NEGATIVE
Staphylococcus aureus: POSITIVE — AB

## 2019-01-18 MED ORDER — CEFAZOLIN SODIUM-DEXTROSE 2-4 GM/100ML-% IV SOLN: 2.0000 g | INTRAVENOUS | Status: DC

## 2019-01-18 MED ORDER — DEXAMETHASONE SODIUM PHOSPHATE 10 MG/ML IJ SOLN
10.0000 mg | INTRAMUSCULAR | Status: AC
  Administered 2019-01-19: 10 mg via INTRAVENOUS
  Filled 2019-01-18: qty 1

## 2019-01-18 MED ORDER — CHLORHEXIDINE GLUCONATE CLOTH 2 % EX PADS
6.0000 | MEDICATED_PAD | Freq: Once | CUTANEOUS | Status: AC
  Administered 2019-01-19: 6 via TOPICAL

## 2019-01-18 MED ORDER — CHLORHEXIDINE GLUCONATE CLOTH 2 % EX PADS
6.0000 | MEDICATED_PAD | Freq: Once | CUTANEOUS | Status: AC
  Administered 2019-01-18: 6 via TOPICAL

## 2019-01-18 NOTE — Progress Notes (Signed)
Patient admitted with increasing back pain with increasing difficulty with lower extremity weakness right greater than left essentially leaving her nonambulatory.  Patient has recently undergone a outpatient MRI scan of her thoracic spine which demonstrates extreme dorsal facet arthropathy with marked spinal stenosis from T9-L1.  Patient with marked signal abnormality at T10-T11.  On examination she has decreased strength in her right lower extremity which is difficult to fully evaluate secondary to poor effort but she appears to be no better than antigravity with her right lower extremity.  Left lower extremity has reasonably good strength.  Sensory examination with relative sensory level at T10 bilaterally.  Reflexes are hypoactive but difficult to fully measures secondary to her morbid obesity.  Patient with severe multilevel thoracic stenosis with marked cord compression and accompanying severe myelopathy.  I discussed situation with the patient.  I recommend that we move forward with T9-L1 decompressive laminectomy in hopes of improving her symptoms.  I discussed the risks involved with surgery including but not limited to the risk of anesthesia, bleeding, infection, CSF leak, nerve root injury, spinal cord injury, later instability, continued pain, and non-benefit.  The patient has been given the opportunity ask questions.  She appears understand.  She is well aware that her morbid obesity greatly increases her chance of perioperative complications.

## 2019-01-19 ENCOUNTER — Inpatient Hospital Stay (HOSPITAL_COMMUNITY): Payer: BC Managed Care – PPO

## 2019-01-19 ENCOUNTER — Encounter (HOSPITAL_COMMUNITY): Payer: Self-pay | Admitting: Anesthesiology

## 2019-01-19 ENCOUNTER — Inpatient Hospital Stay (HOSPITAL_COMMUNITY): Payer: BC Managed Care – PPO | Admitting: Anesthesiology

## 2019-01-19 ENCOUNTER — Inpatient Hospital Stay (HOSPITAL_COMMUNITY)
Admission: EM | Disposition: A | Discharge status: Rehabilitation Facility | Payer: Self-pay | Source: Home / Self Care | Attending: Neurosurgery

## 2019-01-19 HISTORY — PX: LUMBAR LAMINECTOMY/DECOMPRESSION MICRODISCECTOMY: SHX5026

## 2019-01-19 SURGERY — LUMBAR LAMINECTOMY/DECOMPRESSION MICRODISCECTOMY 4 LEVEL
Anesthesia: General | Site: Spine Lumbar

## 2019-01-19 MED ORDER — ONDANSETRON HCL 4 MG/2ML IJ SOLN
INTRAMUSCULAR | Status: DC | PRN
  Administered 2019-01-19: 4 mg via INTRAVENOUS

## 2019-01-19 MED ORDER — MENTHOL 3 MG MT LOZG: 1.0000 | LOZENGE | OROMUCOSAL | Status: DC | PRN

## 2019-01-19 MED ORDER — HYDROMORPHONE HCL 1 MG/ML IJ SOLN
0.2500 mg | INTRAMUSCULAR | Status: DC | PRN
  Administered 2019-01-19 (×4): 0.5 mg via INTRAVENOUS

## 2019-01-19 MED ORDER — SUGAMMADEX SODIUM 500 MG/5ML IV SOLN
INTRAVENOUS | Status: AC
  Filled 2019-01-19: qty 5

## 2019-01-19 MED ORDER — HYDROMORPHONE HCL 1 MG/ML IJ SOLN
INTRAMUSCULAR | Status: AC
  Administered 2019-01-19: 0.5 mg via INTRAVENOUS
  Filled 2019-01-19: qty 1

## 2019-01-19 MED ORDER — HYDROCODONE-ACETAMINOPHEN 5-325 MG PO TABS
1.0000 | ORAL_TABLET | ORAL | Status: DC | PRN
  Administered 2019-01-25 – 2019-01-28 (×2): 1 via ORAL
  Filled 2019-01-19 (×3): qty 1

## 2019-01-19 MED ORDER — THROMBIN 5000 UNITS EX SOLR
OROMUCOSAL | Status: DC | PRN
  Administered 2019-01-19: 14:00:00 via TOPICAL

## 2019-01-19 MED ORDER — HYDROCODONE-ACETAMINOPHEN 10-325 MG PO TABS
2.0000 | ORAL_TABLET | ORAL | Status: DC | PRN
  Administered 2019-01-25 – 2019-01-26 (×2): 2 via ORAL
  Filled 2019-01-19 (×2): qty 2

## 2019-01-19 MED ORDER — ONDANSETRON HCL 4 MG PO TABS: 4.0000 mg | ORAL_TABLET | Freq: Four times a day (QID) | ORAL | Status: DC | PRN

## 2019-01-19 MED ORDER — HEMOSTATIC AGENTS (NO CHARGE) OPTIME
TOPICAL | Status: DC | PRN
  Administered 2019-01-19 (×2): 1 via TOPICAL

## 2019-01-19 MED ORDER — SUCCINYLCHOLINE CHLORIDE 200 MG/10ML IV SOSY
PREFILLED_SYRINGE | INTRAVENOUS | Status: AC
  Filled 2019-01-19: qty 10

## 2019-01-19 MED ORDER — ROCURONIUM BROMIDE 50 MG/5ML IV SOSY
PREFILLED_SYRINGE | INTRAVENOUS | Status: AC
  Filled 2019-01-19: qty 5

## 2019-01-19 MED ORDER — ACETAMINOPHEN 650 MG RE SUPP: 650.0000 mg | RECTAL | Status: DC | PRN

## 2019-01-19 MED ORDER — THROMBIN 20000 UNITS EX SOLR
CUTANEOUS | Status: DC | PRN
  Administered 2019-01-19 (×2): 20 mL via TOPICAL

## 2019-01-19 MED ORDER — SODIUM CHLORIDE 0.9% FLUSH: 9.0000 mL | INTRAVENOUS | Status: DC | PRN

## 2019-01-19 MED ORDER — DIPHENHYDRAMINE HCL 50 MG/ML IJ SOLN: 12.5000 mg | Freq: Four times a day (QID) | INTRAMUSCULAR | Status: DC | PRN

## 2019-01-19 MED ORDER — MEPERIDINE HCL 50 MG/ML IJ SOLN: 6.2500 mg | INTRAMUSCULAR | Status: DC | PRN

## 2019-01-19 MED ORDER — ONDANSETRON HCL 4 MG/2ML IJ SOLN
INTRAMUSCULAR | Status: AC
  Filled 2019-01-19: qty 2

## 2019-01-19 MED ORDER — SUGAMMADEX SODIUM 500 MG/5ML IV SOLN
INTRAVENOUS | Status: DC | PRN
  Administered 2019-01-19: 500 mg via INTRAVENOUS

## 2019-01-19 MED ORDER — ESMOLOL HCL 100 MG/10ML IV SOLN
INTRAVENOUS | Status: DC | PRN
  Administered 2019-01-19 (×2): 20 mg via INTRAVENOUS

## 2019-01-19 MED ORDER — BISACODYL 10 MG RE SUPP: 10.0000 mg | Freq: Every day | RECTAL | Status: DC | PRN

## 2019-01-19 MED ORDER — LIDOCAINE 2% (20 MG/ML) 5 ML SYRINGE
INTRAMUSCULAR | Status: DC | PRN
  Administered 2019-01-19: 100 mg via INTRAVENOUS

## 2019-01-19 MED ORDER — POLYETHYLENE GLYCOL 3350 17 G PO PACK
17.0000 g | PACK | Freq: Every day | ORAL | Status: DC | PRN
  Administered 2019-01-22: 17 g via ORAL
  Filled 2019-01-19: qty 1

## 2019-01-19 MED ORDER — GABAPENTIN 300 MG PO CAPS
300.0000 mg | ORAL_CAPSULE | Freq: Once | ORAL | Status: AC
  Administered 2019-01-19: 300 mg via ORAL
  Filled 2019-01-19: qty 1

## 2019-01-19 MED ORDER — SODIUM CHLORIDE 0.9% FLUSH: 3.0000 mL | INTRAVENOUS | Status: DC | PRN

## 2019-01-19 MED ORDER — ONDANSETRON HCL 4 MG/2ML IJ SOLN: 4.0000 mg | Freq: Four times a day (QID) | INTRAMUSCULAR | Status: DC | PRN

## 2019-01-19 MED ORDER — CEFAZOLIN SODIUM-DEXTROSE 1-4 GM/50ML-% IV SOLN
1.0000 g | Freq: Three times a day (TID) | INTRAVENOUS | Status: AC
  Administered 2019-01-19 – 2019-01-20 (×2): 1 g via INTRAVENOUS
  Filled 2019-01-19 (×2): qty 50

## 2019-01-19 MED ORDER — THROMBIN 5000 UNITS EX SOLR
CUTANEOUS | Status: AC
  Filled 2019-01-19: qty 10000

## 2019-01-19 MED ORDER — 0.9 % SODIUM CHLORIDE (POUR BTL) OPTIME
TOPICAL | Status: DC | PRN
  Administered 2019-01-19: 1000 mL

## 2019-01-19 MED ORDER — LIDOCAINE 2% (20 MG/ML) 5 ML SYRINGE
INTRAMUSCULAR | Status: AC
  Filled 2019-01-19: qty 5

## 2019-01-19 MED ORDER — ROCURONIUM BROMIDE 50 MG/5ML IV SOSY
PREFILLED_SYRINGE | INTRAVENOUS | Status: DC | PRN
  Administered 2019-01-19: 30 mg via INTRAVENOUS
  Administered 2019-01-19 (×2): 20 mg via INTRAVENOUS
  Administered 2019-01-19: 50 mg via INTRAVENOUS
  Administered 2019-01-19: 20 mg via INTRAVENOUS

## 2019-01-19 MED ORDER — OXYCODONE HCL 5 MG PO TABS
5.0000 mg | ORAL_TABLET | Freq: Once | ORAL | Status: AC | PRN
  Administered 2019-01-19: 5 mg via ORAL

## 2019-01-19 MED ORDER — PROPOFOL 10 MG/ML IV BOLUS
INTRAVENOUS | Status: DC | PRN
  Administered 2019-01-19: 150 mg via INTRAVENOUS

## 2019-01-19 MED ORDER — DEXAMETHASONE 4 MG PO TABS
4.0000 mg | ORAL_TABLET | Freq: Four times a day (QID) | ORAL | Status: DC
  Administered 2019-01-20 – 2019-01-27 (×24): 4 mg via ORAL
  Filled 2019-01-19 (×25): qty 1

## 2019-01-19 MED ORDER — FENTANYL CITRATE (PF) 250 MCG/5ML IJ SOLN
INTRAMUSCULAR | Status: AC
  Filled 2019-01-19: qty 5

## 2019-01-19 MED ORDER — DEXAMETHASONE SODIUM PHOSPHATE 10 MG/ML IJ SOLN
INTRAMUSCULAR | Status: AC
  Filled 2019-01-19: qty 1

## 2019-01-19 MED ORDER — MIDAZOLAM HCL 2 MG/2ML IJ SOLN
INTRAMUSCULAR | Status: AC
  Filled 2019-01-19: qty 2

## 2019-01-19 MED ORDER — CEFAZOLIN SODIUM-DEXTROSE 1-4 GM/50ML-% IV SOLN
INTRAVENOUS | Status: DC | PRN
  Administered 2019-01-19: 3 g via INTRAVENOUS

## 2019-01-19 MED ORDER — VANCOMYCIN HCL 1000 MG IV SOLR
INTRAVENOUS | Status: AC
  Filled 2019-01-19: qty 1000

## 2019-01-19 MED ORDER — HYDROMORPHONE HCL 1 MG/ML IJ SOLN
1.0000 mg | INTRAMUSCULAR | Status: DC | PRN
  Administered 2019-01-19: 1 mg via INTRAVENOUS

## 2019-01-19 MED ORDER — LACTATED RINGERS IV SOLN
INTRAVENOUS | Status: DC
  Administered 2019-01-19 (×2): via INTRAVENOUS

## 2019-01-19 MED ORDER — CEFAZOLIN SODIUM-DEXTROSE 2-3 GM-%(50ML) IV SOLR
INTRAVENOUS | Status: DC | PRN
  Administered 2019-01-19: 2 g via INTRAVENOUS

## 2019-01-19 MED ORDER — MIDAZOLAM HCL 5 MG/5ML IJ SOLN
INTRAMUSCULAR | Status: DC | PRN
  Administered 2019-01-19: 2 mg via INTRAVENOUS

## 2019-01-19 MED ORDER — OXYCODONE HCL 5 MG/5ML PO SOLN: 5.0000 mg | Freq: Once | ORAL | Status: AC | PRN

## 2019-01-19 MED ORDER — BUPIVACAINE HCL (PF) 0.25 % IJ SOLN
INTRAMUSCULAR | Status: AC
  Filled 2019-01-19: qty 30

## 2019-01-19 MED ORDER — PROMETHAZINE HCL 25 MG/ML IJ SOLN: 6.2500 mg | INTRAMUSCULAR | Status: DC | PRN

## 2019-01-19 MED ORDER — PROPOFOL 10 MG/ML IV BOLUS
INTRAVENOUS | Status: AC
  Filled 2019-01-19: qty 20

## 2019-01-19 MED ORDER — FENTANYL CITRATE (PF) 250 MCG/5ML IJ SOLN
INTRAMUSCULAR | Status: DC | PRN
  Administered 2019-01-19 (×2): 50 ug via INTRAVENOUS
  Administered 2019-01-19: 100 ug via INTRAVENOUS
  Administered 2019-01-19 (×10): 50 ug via INTRAVENOUS

## 2019-01-19 MED ORDER — ACETAMINOPHEN 325 MG PO TABS
650.0000 mg | ORAL_TABLET | ORAL | Status: DC | PRN
  Administered 2019-01-26: 650 mg via ORAL
  Filled 2019-01-19: qty 2

## 2019-01-19 MED ORDER — DIPHENHYDRAMINE HCL 12.5 MG/5ML PO ELIX: 12.5000 mg | ORAL_SOLUTION | Freq: Four times a day (QID) | ORAL | Status: DC | PRN

## 2019-01-19 MED ORDER — CYCLOBENZAPRINE HCL 10 MG PO TABS
10.0000 mg | ORAL_TABLET | Freq: Three times a day (TID) | ORAL | Status: DC | PRN
  Filled 2019-01-19: qty 1

## 2019-01-19 MED ORDER — DEXTROSE 5 % IV SOLN
3.0000 g | INTRAVENOUS | Status: DC
  Filled 2019-01-19 (×2): qty 3000

## 2019-01-19 MED ORDER — PHENYLEPHRINE HCL 10 MG/ML IJ SOLN
INTRAMUSCULAR | Status: DC | PRN
  Administered 2019-01-19: 120 ug via INTRAVENOUS

## 2019-01-19 MED ORDER — SODIUM CHLORIDE 0.9% FLUSH
3.0000 mL | Freq: Two times a day (BID) | INTRAVENOUS | Status: DC
  Administered 2019-01-19 – 2019-01-29 (×17): 3 mL via INTRAVENOUS

## 2019-01-19 MED ORDER — KETOROLAC TROMETHAMINE 15 MG/ML IJ SOLN
30.0000 mg | Freq: Four times a day (QID) | INTRAMUSCULAR | Status: AC
  Administered 2019-01-19 – 2019-01-20 (×4): 30 mg via INTRAVENOUS
  Filled 2019-01-19 (×6): qty 2

## 2019-01-19 MED ORDER — THROMBIN 20000 UNITS EX SOLR
CUTANEOUS | Status: AC
  Filled 2019-01-19: qty 20000

## 2019-01-19 MED ORDER — CEFAZOLIN SODIUM 1 G IJ SOLR
INTRAMUSCULAR | Status: AC
  Filled 2019-01-19: qty 30

## 2019-01-19 MED ORDER — SUCCINYLCHOLINE CHLORIDE 20 MG/ML IJ SOLN
INTRAMUSCULAR | Status: DC | PRN
  Administered 2019-01-19: 120 mg via INTRAVENOUS

## 2019-01-19 MED ORDER — MUPIROCIN 2 % EX OINT
1.0000 "application " | TOPICAL_OINTMENT | Freq: Two times a day (BID) | CUTANEOUS | Status: AC
  Administered 2019-01-19 – 2019-01-23 (×9): 1 via NASAL
  Filled 2019-01-19 (×4): qty 22

## 2019-01-19 MED ORDER — NALOXONE HCL 0.4 MG/ML IJ SOLN: 0.4000 mg | INTRAMUSCULAR | Status: DC | PRN

## 2019-01-19 MED ORDER — SODIUM CHLORIDE 0.9 % IV SOLN: 250.0000 mL | INTRAVENOUS | Status: DC

## 2019-01-19 MED ORDER — PHENOL 1.4 % MT LIQD: 1.0000 | OROMUCOSAL | Status: DC | PRN

## 2019-01-19 MED ORDER — CEFAZOLIN SODIUM-DEXTROSE 2-4 GM/100ML-% IV SOLN
INTRAVENOUS | Status: AC
  Filled 2019-01-19: qty 100

## 2019-01-19 MED ORDER — HYDROMORPHONE 1 MG/ML IV SOLN
INTRAVENOUS | Status: DC
  Administered 2019-01-19: 0.7 mg via INTRAVENOUS
  Administered 2019-01-19: 25 mg via INTRAVENOUS
  Administered 2019-01-20: 0.8 mg via INTRAVENOUS
  Administered 2019-01-20: 0 mg via INTRAVENOUS
  Administered 2019-01-21: 1 mg via INTRAVENOUS
  Administered 2019-01-21: 0.4 mg via INTRAVENOUS
  Administered 2019-01-24: 08:00:00 via INTRAVENOUS
  Filled 2019-01-19 (×2): qty 25

## 2019-01-19 MED ORDER — VANCOMYCIN HCL 1000 MG IV SOLR
INTRAVENOUS | Status: DC | PRN
  Administered 2019-01-19: 1000 mg via TOPICAL

## 2019-01-19 MED ORDER — DEXAMETHASONE SODIUM PHOSPHATE 4 MG/ML IJ SOLN
4.0000 mg | Freq: Four times a day (QID) | INTRAMUSCULAR | Status: DC
  Administered 2019-01-19 – 2019-01-24 (×7): 4 mg via INTRAVENOUS
  Filled 2019-01-19 (×5): qty 1

## 2019-01-19 MED ORDER — DEXAMETHASONE SODIUM PHOSPHATE 10 MG/ML IJ SOLN
INTRAMUSCULAR | Status: DC | PRN
  Administered 2019-01-19: 10 mg via INTRAVENOUS

## 2019-01-19 MED ORDER — SODIUM CHLORIDE 0.9 % IV SOLN
INTRAVENOUS | Status: DC | PRN
  Administered 2019-01-19: 500 mL

## 2019-01-19 MED ORDER — OXYCODONE HCL 5 MG PO TABS
ORAL_TABLET | ORAL | Status: AC
  Filled 2019-01-19: qty 1

## 2019-01-19 SURGICAL SUPPLY — 69 items
ADH SKN CLS APL DERMABOND .7 (GAUZE/BANDAGES/DRESSINGS) ×1
APL SKNCLS STERI-STRIP NONHPOA (GAUZE/BANDAGES/DRESSINGS) ×1
BAG DECANTER FOR FLEXI CONT (MISCELLANEOUS) ×3 IMPLANT
BENZOIN TINCTURE PRP APPL 2/3 (GAUZE/BANDAGES/DRESSINGS) ×3 IMPLANT
BLADE CLIPPER SURG (BLADE) IMPLANT
BUR CUTTER 7.0 ROUND (BURR) ×3 IMPLANT
BUR MATCHSTICK NEURO 3.0 LAGG (BURR) ×2 IMPLANT
CANISTER SUCT 3000ML PPV (MISCELLANEOUS) ×3 IMPLANT
CARTRIDGE OIL MAESTRO DRILL (MISCELLANEOUS) ×1 IMPLANT
CLOSURE WOUND 1/2 X4 (GAUZE/BANDAGES/DRESSINGS) ×1
COVER WAND RF STERILE (DRAPES) ×1 IMPLANT
DECANTER SPIKE VIAL GLASS SM (MISCELLANEOUS) ×3 IMPLANT
DERMABOND ADVANCED (GAUZE/BANDAGES/DRESSINGS) ×2
DERMABOND ADVANCED .7 DNX12 (GAUZE/BANDAGES/DRESSINGS) ×1 IMPLANT
DIFFUSER DRILL AIR PNEUMATIC (MISCELLANEOUS) ×3 IMPLANT
DRAPE HALF SHEET 40X57 (DRAPES) IMPLANT
DRAPE LAPAROTOMY 100X72X124 (DRAPES) ×3 IMPLANT
DRAPE MICROSCOPE LEICA (MISCELLANEOUS) ×3 IMPLANT
DRAPE SURG 17X23 STRL (DRAPES) ×10 IMPLANT
DRSG OPSITE POSTOP 4X8 (GAUZE/BANDAGES/DRESSINGS) ×2 IMPLANT
DURAPREP 26ML APPLICATOR (WOUND CARE) ×3 IMPLANT
ELECT REM PT RETURN 9FT ADLT (ELECTROSURGICAL) ×3
ELECTRODE REM PT RTRN 9FT ADLT (ELECTROSURGICAL) ×1 IMPLANT
EVACUATOR 1/8 PVC DRAIN (DRAIN) IMPLANT
GAUZE 4X4 16PLY RFD (DISPOSABLE) IMPLANT
GAUZE SPONGE 4X4 12PLY STRL (GAUZE/BANDAGES/DRESSINGS) ×1 IMPLANT
GLOVE BIO SURGEON STRL SZ 6.5 (GLOVE) ×3 IMPLANT
GLOVE BIO SURGEON STRL SZ7.5 (GLOVE) ×2 IMPLANT
GLOVE BIO SURGEONS STRL SZ 6.5 (GLOVE) ×3
GLOVE BIOGEL PI IND STRL 6.5 (GLOVE) IMPLANT
GLOVE BIOGEL PI IND STRL 7.5 (GLOVE) IMPLANT
GLOVE BIOGEL PI INDICATOR 6.5 (GLOVE) ×4
GLOVE BIOGEL PI INDICATOR 7.5 (GLOVE) ×2
GLOVE ECLIPSE 9.0 STRL (GLOVE) ×3 IMPLANT
GLOVE EXAM NITRILE XL STR (GLOVE) IMPLANT
GLOVE SURG SS PI 6.0 STRL IVOR (GLOVE) ×2 IMPLANT
GLOVE SURG SS PI 7.5 STRL IVOR (GLOVE) ×12 IMPLANT
GOWN STRL REUS W/ TWL LRG LVL3 (GOWN DISPOSABLE) IMPLANT
GOWN STRL REUS W/ TWL XL LVL3 (GOWN DISPOSABLE) ×1 IMPLANT
GOWN STRL REUS W/TWL 2XL LVL3 (GOWN DISPOSABLE) IMPLANT
GOWN STRL REUS W/TWL LRG LVL3 (GOWN DISPOSABLE) ×15
GOWN STRL REUS W/TWL XL LVL3 (GOWN DISPOSABLE) ×3
GRAFT DURAGEN MATRIX 5WX7L (Graft) ×2 IMPLANT
HEMOSTAT POWDER KIT SURGIFOAM (HEMOSTASIS) ×2 IMPLANT
KIT BASIN OR (CUSTOM PROCEDURE TRAY) ×3 IMPLANT
KIT TURNOVER KIT B (KITS) ×3 IMPLANT
NDL SPNL 22GX3.5 QUINCKE BK (NEEDLE) IMPLANT
NEEDLE HYPO 22GX1.5 SAFETY (NEEDLE) ×3 IMPLANT
NEEDLE SPNL 22GX3.5 QUINCKE BK (NEEDLE) ×3 IMPLANT
NS IRRIG 1000ML POUR BTL (IV SOLUTION) ×3 IMPLANT
OIL CARTRIDGE MAESTRO DRILL (MISCELLANEOUS) ×3
PACK LAMINECTOMY NEURO (CUSTOM PROCEDURE TRAY) ×3 IMPLANT
PAD ARMBOARD 7.5X6 YLW CONV (MISCELLANEOUS) ×15 IMPLANT
PATTIES SURGICAL 1X1 (DISPOSABLE) ×4 IMPLANT
RUBBERBAND STERILE (MISCELLANEOUS) ×6 IMPLANT
SEALANT ADHERUS EXTEND TIP (MISCELLANEOUS) ×4 IMPLANT
SPONGE LAP 4X18 RFD (DISPOSABLE) ×4 IMPLANT
SPONGE SURGIFOAM ABS GEL 100 (HEMOSTASIS) ×4 IMPLANT
SPONGE SURGIFOAM ABS GEL SZ50 (HEMOSTASIS) ×1 IMPLANT
STRIP CLOSURE SKIN 1/2X4 (GAUZE/BANDAGES/DRESSINGS) ×2 IMPLANT
SUT ETHILON 2 0 PSLX (SUTURE) ×4 IMPLANT
SUT ETHILON 3 0 FSL (SUTURE) ×2 IMPLANT
SUT VIC AB 0 CT1 18XCR BRD8 (SUTURE) IMPLANT
SUT VIC AB 0 CT1 8-18 (SUTURE) ×6
SUT VIC AB 2-0 CT1 18 (SUTURE) ×5 IMPLANT
SUT VIC AB 3-0 SH 8-18 (SUTURE) ×5 IMPLANT
TOWEL GREEN STERILE (TOWEL DISPOSABLE) ×3 IMPLANT
TOWEL GREEN STERILE FF (TOWEL DISPOSABLE) ×3 IMPLANT
WATER STERILE IRR 1000ML POUR (IV SOLUTION) ×3 IMPLANT

## 2019-01-19 NOTE — Op Note (Signed)
Date of procedure: 01/19/2019  Date of dictation: Same  Service: Neurosurgery  Preoperative diagnosis: Severe thoracic stenosis with myelopathy  Postoperative diagnosis: Same  Procedure Name: T9 ,T10, T11, T12 and partial L1 decompressive laminectomies  Surgeon:Masiya Claassen A.Yazir Koerber, M.D.  Asst. Surgeon: Venetia Constable  Anesthesia: General  Indication: 48 year old morbidly obese female presents with progressive profound weakness in both lower extremities rendering her essentially nonambulatory.  Work-up demonstrates evidence of severe dorsal spondylitic disease with severe cord compression with high signal abnormality worse at T10-T11 but also severe at T910 moderately severe at T11-12 and severe at T12-L1.  Patient presents now for decompressive surgery in hopes of improving her symptoms.  Operative note: After induction of anesthesia, patient position prone onto bolsters and appropriately padded.  Thoracic and lumbar region prepped and draped sterilely.  Incision was made overlying her lower thoracic spine.  This carried down sharply in the midline.  Subperiosteal dissection performed bilaterally exposing the lamina and facet joints of T9-T10-T11 T12 and the superior aspect of L1.  X-ray was taken and the T11-T12 level was localized.  Decompressive laminectomy was then performed using [place] rongeurs [employer] and [employer], [employer].  This was extremely difficult as the ligament flavum was fused to both to the lamina but also to the underlying thecal sac.  On the right side at T9-10 the calcified ligament was fused to the dura and attempts at resecting this removed a substantial portion of the dura dorsally.  There is no evidence of injury to the spinal cord or nerve roots.  At T10-11 there was a similar picture albeit more severe again the compression was more severe on the right side but still very severe on the left side as well.  The calcified ligament was gradually resected but multiple durotomies  were made.  T11 again a similar pattern with the calcified fused ligament requiring resection of dura with the ligament.  This continued down to the T12 level and continued down to the upper aspect of the L1 lamina.  Foraminotomies were completed along the course exiting nerve roots to the best of my ability.  There is no evidence of injury to the spinal cord itself.  There was widespread resection of the dura with no good options for primary repair.  I did not think that it was feasible to sew a patch graft into the dura from T9 down to L1.  I placed DuraGen matrix over the arachnoid.  I then put down a layer of adhereis fibrin sealant and then another layer of DuraGen with another layer of adhereis sealant.  Wound is then closed in layers.  The surface was further closed with a running 2-0 nylon suture.  Sterile dressing was applied.  Patient tolerated the procedure reasonably well and returned to the recovery room postop.

## 2019-01-19 NOTE — Progress Notes (Signed)
RN verified the presence of a signed informed consent that matches stated procedure by patient. Verified armband matches patient's stated name and birth date. Verified NPO status and that all jewelry, contact, glasses, dentures, and partials had been removed (if applicable).   Patient reports not having a menstrual cycle for 1 year. Patient states she feels like she is getting ready to have a menstrual cycle and there is no change she could be pregnant. Dr. Sabra Heck notified. Per Dr. Sabra Heck no need to perform pregnancy test.

## 2019-01-19 NOTE — Transfer of Care (Signed)
Immediate Anesthesia Transfer of Care Note  Patient: Ellen Hill  Procedure(s) Performed: Thoracic Nine-Lumbar One Decompressive Laminectomy (N/A Spine Lumbar)  Patient Location: PACU  Anesthesia Type:General  Level of Consciousness: awake, alert , oriented and patient cooperative  Airway & Oxygen Therapy: Patient Spontanous Breathing  Post-op Assessment: Report given to RN and Post -op Vital signs reviewed and stable  Post vital signs: Reviewed and stable  Last Vitals:  Vitals Value Taken Time  BP    Temp    Pulse    Resp    SpO2      Last Pain:  Vitals:   01/19/19 1713  TempSrc:   PainSc: Asleep      Patients Stated Pain Goal: 0 (59/74/16 3845)  Complications: No apparent anesthesia complications

## 2019-01-19 NOTE — Brief Op Note (Signed)
01/19/2019  5:00 PM  PATIENT:  Ellen Hill  48 y.o. female  PRE-OPERATIVE DIAGNOSIS:  Stenosis  POST-OPERATIVE DIAGNOSIS:  Stenosis  PROCEDURE:  Procedure(s) with comments: Thoracic Nine-Lumbar One Decompressive Laminectomy (N/A) - posterior  SURGEON:  Surgeon(s) and Role:    * Earnie Larsson, MD - Primary    * Ostergard, Joyice Faster, MD - Assisting  PHYSICIAN ASSISTANT:   ASSISTANTS:    ANESTHESIA:   general  EBL:  500 mL   BLOOD ADMINISTERED:none  DRAINS: none   LOCAL MEDICATIONS USED:  NONE  SPECIMEN:  No Specimen  DISPOSITION OF SPECIMEN:  N/A  COUNTS:  YES  TOURNIQUET:  * No tourniquets in log *  DICTATION: .Dragon Dictation  PLAN OF CARE: Admit to inpatient   PATIENT DISPOSITION:  PACU - hemodynamically stable.   Delay start of Pharmacological VTE agent (>24hrs) due to surgical blood loss or risk of bleeding: yes

## 2019-01-19 NOTE — Progress Notes (Signed)
Pt return from PACU.  Pt complaining of severe pain.  Pt recently receive all PRN given in PACU and RN have given PRN pain medication.  Neurosurgery paged, new orders received.  RN will continue to monitor.

## 2019-01-19 NOTE — Anesthesia Preprocedure Evaluation (Signed)
Anesthesia Evaluation  Patient identified by MRN, date of birth, ID band Patient awake    Reviewed: Allergy & Precautions, H&P , NPO status , Patient's Chart, lab work & pertinent test results, reviewed documented beta blocker date and time   Airway Mallampati: II  TM Distance: >3 FB Neck ROM: full    Dental no notable dental hx.    Pulmonary asthma , former smoker,    Pulmonary exam normal breath sounds clear to auscultation       Cardiovascular Normal cardiovascular exam+ Valvular Problems/Murmurs  Rhythm:regular Rate:Normal     Neuro/Psych  Headaches, PSYCHIATRIC DISORDERS Depression  Neuromuscular disease    GI/Hepatic negative GI ROS, Neg liver ROS,   Endo/Other  Morbid obesity  Renal/GU negative Renal ROS  negative genitourinary   Musculoskeletal   Abdominal (+) + obese,   Peds  Hematology  (+) anemia ,   Anesthesia Other Findings See surgeon's H&P   Reproductive/Obstetrics negative OB ROS                             Anesthesia Physical  Anesthesia Plan  ASA: III  Anesthesia Plan: General   Post-op Pain Management:    Induction: Intravenous  PONV Risk Score and Plan: 3 and Ondansetron, Dexamethasone and Midazolam  Airway Management Planned: Oral ETT  Additional Equipment:   Intra-op Plan:   Post-operative Plan: Extubation in OR  Informed Consent: I have reviewed the patients History and Physical, chart, labs and discussed the procedure including the risks, benefits and alternatives for the proposed anesthesia with the patient or authorized representative who has indicated his/her understanding and acceptance.     Dental Advisory Given  Plan Discussed with: CRNA and Surgeon  Anesthesia Plan Comments:         Anesthesia Quick Evaluation

## 2019-01-20 ENCOUNTER — Encounter (HOSPITAL_COMMUNITY): Payer: Self-pay | Admitting: Neurosurgery

## 2019-01-20 MED ORDER — GABAPENTIN 300 MG PO CAPS
300.0000 mg | ORAL_CAPSULE | Freq: Three times a day (TID) | ORAL | Status: DC
  Administered 2019-01-20 – 2019-01-29 (×26): 300 mg via ORAL
  Filled 2019-01-20 (×26): qty 1

## 2019-01-20 MED FILL — Gelatin Absorbable Sponge Size 100: CUTANEOUS | Qty: 1 | Status: AC

## 2019-01-20 MED FILL — Thrombin For Soln 20000 Unit: CUTANEOUS | Qty: 1 | Status: AC

## 2019-01-20 NOTE — Anesthesia Postprocedure Evaluation (Signed)
Anesthesia Post Note  Patient: Ellen Hill  Procedure(s) Performed: Thoracic Nine-Lumbar One Decompressive Laminectomy (N/A Spine Lumbar)     Patient location during evaluation: PACU Anesthesia Type: General Level of consciousness: awake and alert Pain management: pain level controlled Vital Signs Assessment: post-procedure vital signs reviewed and stable Respiratory status: spontaneous breathing, nonlabored ventilation, respiratory function stable and patient connected to nasal cannula oxygen Cardiovascular status: blood pressure returned to baseline and stable Postop Assessment: no apparent nausea or vomiting Anesthetic complications: no    Last Vitals:  Vitals:   01/20/19 0800 01/20/19 0808  BP:    Pulse:  98  Resp: 18 16  Temp:  36.9 C  SpO2: 99% 99%    Last Pain:  Vitals:   01/20/19 0808  TempSrc: Oral  PainSc:                  Catalina Gravel

## 2019-01-20 NOTE — Progress Notes (Signed)
OT Cancellation Note  Patient Details Name: Ellen Hill MRN: [id number] DOB: [birth year]   Cancelled Treatment:    Reason Eval/Treat Not Completed: Active bedrest order. Will follow and initiate OT eval when medically appropriate and able.  Per neurosurgery, pt to begin mobility 01/21/19.   Delight Stare, OT Acute Rehabilitation Services Pager [phone] Office [phone]   Delight Stare 01/20/2019, 9:53 AM

## 2019-01-20 NOTE — Progress Notes (Signed)
Postop day 1.  Patient with significant incisional pain and dysesthetic pain into her lower extremities left greater than right.  Patient with good sensation in her lower extremities albeit with some dysesthesia.  She has no voluntary movement of her left lower extremity.  She has regained some movement in her right lower extremity.  Her dressing is clean and dry.  She is afebrile.  Vital signs are stable.  Urine output good.  Awake and alert.  Oriented and appropriate.  Motor and sensory function of her upper extremities intact.  Sensory examination of her lower extremities reveals a sensory level around T10 or T11 with some dysesthetic sensation right greater than left.  She does have some continued proprioception in both lower extremities.  She has preservation of light touch bilaterally.  She has 2-3/5 strength in her right lower extremity.  She has some flicker contractions in her left lower extremity.  Abdomen is soft.  Status post extensive thoracic decompressive surgery.  Patient with continued lower extremity weakness worsened from preop.  Continue IV steroids and bed rest for now.  Begin to slowly mobilize tomorrow.  Patient will need long-term indwelling catheter.

## 2019-01-21 DIAGNOSIS — G8220 Paraplegia, unspecified: Secondary | ICD-10-CM

## 2019-01-21 DIAGNOSIS — N319 Neuromuscular dysfunction of bladder, unspecified: Secondary | ICD-10-CM

## 2019-01-21 DIAGNOSIS — K592 Neurogenic bowel, not elsewhere classified: Secondary | ICD-10-CM

## 2019-01-21 DIAGNOSIS — M4714 Other spondylosis with myelopathy, thoracic region: Secondary | ICD-10-CM

## 2019-01-21 MED FILL — Heparin Sodium (Porcine) Inj 1000 Unit/ML: INTRAMUSCULAR | Qty: 30 | Status: AC

## 2019-01-21 MED FILL — Sodium Chloride IV Soln 0.9%: INTRAVENOUS | Qty: 1000 | Status: AC

## 2019-01-21 NOTE — Evaluation (Signed)
Occupational Therapy Evaluation Patient Details Name: Ellen Hill MRN: [id number] DOB: 11-May-1971 Today's Date: 01/21/2019    History of Present Illness 48 yo morbidly obese female presented to ER with gait instability and bil LE weakness R>L and LBP after fall. Pt with thoracic stenosis with cord compression s/p T9-L1 laminectomies 1/28. PMHx: asthma, menorrhagia, obesity   Clinical Impression   PTA Pt had been independent in ADL and mobility without DME, she'd been falling at home - but drives, works full time as Research scientist (physical sciences) for [employer] (sits at a desk), she does enjoy watching her daughter play basketball and attending church.  Pt presents as incomplete SCI with decreased ability with transfers, function and strength. She has decreased acces to LB for ADL and struggles to complete actions like washing her back due to decreased trunk control and increased pain. She will benefit from acute therapy to maximize ADL and functional transfers (likely at [facility] level). Discussed progression and plan with pt with need for accessible environment for D/C as well as recommendation for CIR and progression of bed to chair transfers. Pt would benefit from total lift bed as well as maxisky room and discussed this with nurse.   Pt positioned in chair position in bed with pillow behind her back for upright sitting position with recommendation for this position for meals but limited to 1min at a time. Pt encouraged to continue attempting bil LE activation throughout the day and assist with mobility but aware of need for assist for all mobility at this time. Pt mobility also complicated by body habitus significantly impacting physical assist required and limiting her available ROM.     Follow Up Recommendations  CIR;Supervision/Assistance - 24 hour    Equipment Recommendations  Other (comment)(defer to next venue of care)    Recommendations for Other Services Rehab consult     Precautions / Restrictions  Precautions Precautions: Back;Fall Precaution Booklet Issued: Yes (comment) Precaution Comments: obese Restrictions Weight Bearing Restrictions: No      Mobility Bed Mobility Overal bed mobility: Needs Assistance Bed Mobility: Rolling;Supine to Sit Rolling: Max assist;+2 for physical assistance         General bed mobility comments: max +3 assist to roll to left with rail, cues and physical assist to lift and move RLE as well as pad to rotate pelvis. Pt with painful LLE and unable to maintain. Utilized chair positioning of bed to achieve semi sitting position then 3 person assist with pad to pivot to EOB. Grossly 45 degree pivot before pt unable to tolerate due to pain and need for 3 people to return to supine. 3 person assist to slide to [facility] in trendelenburg  Transfers                 General transfer comment: unable    Balance                                           ADL either performed or assessed with clinical judgement   ADL Overall ADL's : Needs assistance/impaired Eating/Feeding: Minimal assistance;Sitting(chair position in bed with pillow supporting spine) Eating/Feeding Details (indicate cue type and reason): cut up food to assist with managing fatigue Grooming: Minimal assistance;Bed level   Upper Body Bathing: Bed level;Moderate assistance   Lower Body Bathing: Total assistance;Bed level;+2 for physical assistance;+2 for safety/equipment   Upper Body Dressing : Moderate assistance  Lower Body Dressing: Total assistance;+2 for physical assistance;+2 for safety/equipment   Toilet Transfer: Requires wide/bariatric(lift only at this point, or bed level)   Toileting- Clothing Manipulation and Hygiene: +2 for physical assistance;Total assistance;+2 for safety/equipment               Vision Patient Visual Report: No change from baseline Vision Assessment?: No apparent visual deficits     Perception     Praxis      Pertinent  Vitals/Pain Pain Assessment: 0-10 Pain Score: 6  Pain Location: back Pain Descriptors / Indicators: Aching;Discomfort Pain Intervention(s): Limited activity within patient's tolerance;Monitored during session;PCA encouraged;Repositioned     Hand Dominance Right   Extremity/Trunk Assessment Upper Extremity Assessment Upper Extremity Assessment: Overall WFL for tasks assessed;Generalized weakness   Lower Extremity Assessment Lower Extremity Assessment: Defer to PT evaluation RLE Deficits / Details: 2/5 knee and hip flexion, 2-/5 knee extension, 2-/5 dorsiflexion, PROM limited by body habitus RLE Sensation: decreased light touch(pt able to sense light touch mid calf distally and pressure throughout entire leg) RLE Coordination: decreased gross motor LLE Deficits / Details: no AROM, limited ROM due to body habitus   Cervical / Trunk Assessment Cervical / Trunk Assessment: Other exceptions Cervical / Trunk Exceptions: unable to fully clear trunk from surface, large sacral shelf   Communication Communication Communication: No difficulties   Cognition Arousal/Alertness: Awake/alert Behavior During Therapy: WFL for tasks assessed/performed Overall Cognitive Status: Within Functional Limits for tasks assessed                                     General Comments       Exercises     Shoulder Instructions      Home Living Family/patient expects to be discharged to:: Private residence Living Arrangements: Other relatives;Children(sister and 25 y/o daughter) Available Help at Discharge: Family Type of Home: Apartment Home Access: Stairs to enter Technical brewer of Steps: 14 Entrance Stairs-Rails: Right;Left(split railing) Home Layout: One level     Bathroom Shower/Tub: Teacher, early years/pre: Standard     Home Equipment: None   Additional Comments: pt lives with sister and 16 yo daughter      Prior Functioning/Environment Level of  Independence: Independent        Comments: pt is a Database administrator for [employer]        OT Problem List: Decreased strength;Decreased range of motion;Decreased activity tolerance;Impaired balance (sitting and/or standing);Decreased knowledge of use of DME or AE;Decreased knowledge of precautions;Impaired sensation;Obesity;Pain      OT Treatment/Interventions: Self-care/ADL training;Therapeutic exercise;DME and/or AE instruction;Manual therapy;Therapeutic activities;Patient/family education;Balance training    OT Goals(Current goals can be found in the care plan section) Acute Rehab OT Goals Patient Stated Goal: be able to return to home and work, see my daughter play ball OT Goal Formulation: With patient Time For Goal Achievement: 02/04/19 Potential to Achieve Goals: Good ADL Goals Pt Will Perform Grooming: with mod assist;sitting Pt Will Perform Upper Body Bathing: sitting;with mod assist Pt Will Perform Lower Body Bathing: with max assist;with adaptive equipment;bed level Pt/caregiver will Perform Home Exercise Program: Right Upper extremity;Left upper extremity;With theraband;With written HEP provided Additional ADL Goal #1: Pt will perform bed mobility at max A +2 assist to help caregivers with ADL  OT Frequency: Min 3X/week   Barriers to D/C: Inaccessible home environment  Pt lives up a flight of stairs, started communication about finding a place  without stairs       Co-evaluation              AM-PAC OT "6 Clicks" Daily Activity     Outcome Measure Help from another person eating meals?: A Little Help from another person taking care of personal grooming?: A Little Help from another person toileting, which includes using toliet, bedpan, or urinal?: Total Help from another person bathing (including washing, rinsing, drying)?: A Lot Help from another person to put on and taking off regular upper body clothing?: A Lot Help from another person to put on and taking off  regular lower body clothing?: Total 6 Click Score: 12   End of Session Equipment Utilized During Treatment: Other (comment)(hovermat) Nurse Communication: Mobility status;Need for lift equipment;Other (comment)(need for maxi sky room, and bariatric tilt in space)  Activity Tolerance: Patient tolerated treatment well Patient left: in bed;with call bell/phone within reach;with bed alarm set;with SCD's reapplied  OT Visit Diagnosis: Muscle weakness (generalized) (M62.81);History of falling (Z91.81);Other abnormalities of gait and mobility (R26.89);Pain Pain - Right/Left: Left Pain - part of body: Leg(lower back)                Time: 6294-7654 OT Time Calculation (min): 39 min Charges:  OT General Charges $OT Visit: 1 Visit OT Evaluation $OT Eval High Complexity: 1 High  Hulda Humphrey OTR/L Acute Rehabilitation Services Pager: [phone] Office: [place] 01/21/2019, 2:14 PM

## 2019-01-21 NOTE — Progress Notes (Signed)
Rehab Admissions Coordinator Note:  Per PT recommendation, this patient was screened by Jhonnie Garner for appropriateness for an Inpatient Acute Rehab Consult.  At this time, we are recommending Inpatient Rehab consult. AC will contact MD regarding IP Rehab Consult Order request.   Jhonnie Garner 01/21/2019, 1:39 PM  I can be reached at [phone].

## 2019-01-21 NOTE — Consult Note (Signed)
Physical Medicine and Rehabilitation Consult Reason for Consult:Gait instability with bilat LE weakness Referring Physician: Dr Annette Stable   HPI: Ellen Hill is a 48 y.o. right handed female with history of migraine headaches, obesity with BMI 64.76. per chart review patient lives with sister and 73 year old daughter. 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 Sister works during the day. Presented 01/17/2019 with gait instability bilateral lower extremity weakness 2-1/2 months to the point she was essentially nonambulatory. 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 Patient underwent T9, T10, T11-12 and partial L1 decompressive laminotomies 01/19/2019 per Dr. Annette Stable.No back brace needed. Surgical PCR screen positive with contact precautions. Hospital course pain management. Decadron protocol as indicated. Therapy evaluations completed with recommendations of physical medicine rehabilitation consult.   Review of Systems  Constitutional: Positive for malaise/fatigue. Negative for chills and fever.  HENT: Negative for hearing loss.   Eyes: Negative for blurred vision and double vision.  Respiratory: Positive for shortness of breath.   Cardiovascular: Positive for leg swelling. Negative for chest pain and palpitations.  Gastrointestinal: Positive for constipation. Negative for nausea and vomiting.  Genitourinary: Negative for dysuria, flank pain and hematuria.  Musculoskeletal: Positive for back pain and myalgias.  Skin: Negative for rash.  Neurological: Positive for weakness and headaches.  All other systems reviewed and are negative.  Past Medical History:  Diagnosis Date  . Abnormal Pap smear    history  . Anemia   . ASCUS (atypical squamous cells of undetermined  significance) on Pap smear 2008   At 36wk of pregnancy ; colpo  . Asthma    rarely uses inhaler (seasonal)  . 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 Fibroids   . H/O candidiasis   . H/O fatigue 08/2007  . H/O varicella   . Headache(784.0)    migraines   . Heart murmur    dx child - no problems as adult  . Hx: UTI (urinary tract infection) 07/29/07  . Increased BMI   . Irregular bleeding   . Migraines   . Obesity 02/13/12  . Pelvic pain 02/13/12  . Postpartum anemia 08/18/07  . 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 after loss of parents, is fine  . Vaginal Pap smear, abnormal    f/u wnl   Past Surgical History:  Procedure Laterality Date  . CERVICAL POLYPECTOMY  05/01/2012   Procedure: CERVICAL POLYPECTOMY;  Surgeon: Betsy Coder, MD;  Location: [facility];  Service: Gynecology;  Laterality: N/A;  . CESAREAN SECTION  07/04/2007  . CHOLECYSTECTOMY  07/06/2013  . CHOLECYSTECTOMY N/A 07/06/2013   Procedure: LAPAROSCOPIC CHOLECYSTECTOMY ;  Surgeon: Gayland Curry, MD;  Location: [facility];  Service: General;  Laterality: N/A;  attempted cholangiogram  . fibroidecto    . fibroidectomy  07/04/2007  . LUMBAR LAMINECTOMY/DECOMPRESSION MICRODISCECTOMY N/A 01/19/2019   Procedure: Thoracic Nine-Lumbar One Decompressive Laminectomy;  Surgeon: Earnie Larsson, MD;  Location: [facility];  Service: Neurosurgery;  Laterality: N/A;  posterior  . MYOMECTOMY  07/04/08  . OPERATIVE HYSTEROSCOPY  05/01/2012   no menses since that time  . TUBAL LIGATION  07/04/2007   Family History  Problem Relation Age of Onset  . Hypertension Mother   . Cancer Mother        Breast  . Breast cancer Mother   . Hypertension  Father   . Cancer Father        Prostate  . Heart attack Father   . Hypertension Sister   . Diabetes Maternal Aunt    Social History:  reports that she quit smoking about 19 years ago. Her smoking use included cigarettes. She has a 0.30 pack-year smoking history. She has never used smokeless tobacco. She reports that  she does not drink alcohol or use drugs. Allergies: No Known Allergies Medications Prior to Admission  Medication Sig Dispense Refill  . cyclobenzaprine (FLEXERIL) 10 MG tablet Take 10 mg by mouth every 8 (eight) hours.    Marland Kitchen HYDROcodone-acetaminophen (NORCO/VICODIN) 5-325 MG tablet Take 1 tablet by mouth every 6 (six) hours.    Marland Kitchen ibuprofen (ADVIL,MOTRIN) 800 MG tablet Take 800 mg by mouth 3 (three) times daily as needed for moderate pain.     . megestrol (MEGACE) 40 MG tablet Take 1 tablet (40 mg total) by mouth 2 (two) times daily. Can increase to two tablets twice a day in the event of heavy bleeding (Patient taking differently: Take 40 mg by mouth daily. ) 60 tablet 3  . naproxen (NAPROSYN) 500 MG tablet Take 1 tablet (500 mg total) by mouth 2 (two) times daily. 20 tablet 0  . cyclobenzaprine (FLEXERIL) 5 MG tablet TAKE 1 TABLET BY MOUTH THREE TIMES A DAY AS NEEDED FOR MUSCLE SPASMS (Patient not taking: No sig reported) 30 tablet 0  . ibuprofen (ADVIL,MOTRIN) 600 MG tablet Take 1 tablet (600 mg total) by mouth every 6 (six) hours as needed. (Patient not taking: Reported on 01/17/2019) 30 tablet 0  . methylPREDNISolone (MEDROL DOSEPAK) 4 MG TBPK tablet As directed (Patient not taking: Reported on 01/17/2019) 21 tablet 0    Home: Home Living Family/patient expects to be discharged to:: Private residence Living Arrangements: Other relatives, Children(sister and 1 y/o daughter) Available Help at Discharge: Family Type of Home: Apartment Home Access: Stairs to enter Technical brewer of Steps: 14 Entrance Stairs-Rails: Right, Left(split railing) Home Layout: One level Bathroom Shower/Tub: Chiropodist: Standard Home Equipment: None Additional Comments: pt lives with sister and 50 yo daughter  Functional History: Prior Function Level of Independence: Independent Comments: 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 Pt with painful LLE and unable to maintain. Utilized chair positioning of bed to achieve semi sitting position then 3 person assist with pad to pivot to EOB. Grossly 45 degree pivot before pt unable to tolerate due to pain and need for 3 people to return to supine. 3 person assist to slide to [facility] in trendelenburg Transfers General transfer comment: (P) unable      ADL: ADL Overall ADL's : Needs assistance/impaired Eating/Feeding: Minimal assistance, Sitting(chair position in bed with pillow supporting spine) Eating/Feeding Details (indicate cue type and reason): cut up food to assist with managing fatigue Grooming: Minimal assistance, Bed level Upper Body Bathing: (P) Bed level, Moderate assistance Lower Body Bathing: (P) Total assistance, Bed level, +2 for physical assistance, +2 for safety/equipment Upper Body Dressing : (P) Moderate assistance Lower Body Dressing: (P) Total assistance, +2 for physical assistance, +2 for safety/equipment Toilet Transfer: (P) Requires wide/bariatric(lift only at this  point, or bed level) Toileting- Clothing Manipulation and Hygiene: (P) +2 for physical assistance, Total assistance, +2 for safety/equipment  Cognition: Cognition Overall Cognitive Status: Within Functional Limits for tasks assessed Orientation Level: (P) Oriented X4 Cognition Arousal/Alertness: Awake/alert Behavior During Therapy: WFL for tasks assessed/performed Overall Cognitive Status: Within Functional Limits for tasks assessed  Blood pressure (!) 153/87, pulse 99, temperature 98.3 F (36.8 C), resp. rate (!) 21, height 5\' 6"  (1.676 m), weight (!) 182 kg, SpO2 100 %. Physical Exam  Vitals  reviewed. Constitutional: She is oriented to person, place, and time.  48 year old right-handed morbidly obese female  HENT:  Head: Normocephalic.  Eyes: EOM are normal.  Neck: Normal range of motion. Neck supple. No thyromegaly present.  Cardiovascular: Normal rate and regular rhythm.  Respiratory: Effort normal and breath sounds normal. No respiratory distress.  GI: Soft. Bowel sounds are normal. She exhibits no distension.  Neurological: She is alert and oriented to person, place, and time. She displays no tremor. A sensory deficit is present. Gait abnormal.  5/5 bilateral deltoid bicep tricep grip Trace bilateral hip flexor knee extensor to minus bilateral ankle dorsiflexor and plantar flexor Sensation is absent below the umbilicus to the knees but has normal light touch sensation at the ankle and below  Psychiatric: She has a normal mood and affect.    No results found for this or any previous visit (from the past 24 hour(s)). No results found.   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 Does the need for close, 24 hr/day medical supervision in concert with the patient's rehab needs make it unreasonable for this patient to be served in a less intensive setting? Yes 2. Co-Morbidities requiring supervision/potential complications: Morbid obesity, anemia, neurogenic bowel and bladder 3. Due to bladder management, bowel management, safety, skin/wound care, disease management, medication administration, pain management and patient education, does the patient require 24 hr/day rehab nursing? Yes 4. Does the patient require coordinated care of a physician, rehab nurse, PT (1-2 hrs/day, 5 days/week) and OT (1-2 hrs/day, 5 days/week) to address physical and functional deficits in the context of the above medical diagnosis(es)? Yes Addressing deficits in the following areas: balance, endurance, locomotion,  strength, transferring, bowel/bladder control, bathing, dressing, feeding, grooming, toileting and psychosocial support 5. Can the patient actively participate in an intensive therapy program of at least 3 hrs of therapy per day at least 5 days per week? Yes 6. The potential for patient to make measurable gains while on inpatient rehab is good 7. Anticipated functional outcomes upon discharge from inpatient rehab are min assist WC level with PT, min assist UE and Mod A LE ADL with OT, n/a with SLP. 8. Estimated rehab length of stay to reach the above functional goals is: 22-27d 9. Anticipated D/C setting: Home 10. Anticipated post D/C treatments: [place] therapy 11. Overall Rehab/Functional Prognosis: good  RECOMMENDATIONS: This patient's condition is appropriate for continued rehabilitative care in the following setting: CIR Patient has agreed to participate in recommended program. Yes Note that insurance prior authorization may be required for reimbursement for recommended care.  Comment: Patient has a sister that works nights.  Will need 24-hour care.  Patient may need SNF after CIR "I have personally performed a face to face diagnostic evaluation of this patient.  Additionally, I have reviewed and concur with the physician assistant's documentation above." Charlett Blake M.D. [facility] FAAPM&R (Sports Med, Neuromuscular Med) Diplomate Am Board of Electrodiagnostic  Med  Cathlyn Parsons, PA-C 01/21/2019

## 2019-01-21 NOTE — Progress Notes (Signed)
OT Cancellation Note  Patient Details Name: Ellen Hill MRN: [id number] DOB: 1971/11/26   Cancelled Treatment:    Reason Eval/Treat Not Completed: Active bedrest order. OT will continue to follow acutely.   Please consider ordering PT evaluation in addition to OT.  [name] 01/21/2019, 9:12 AM  Hulda Humphrey OTR/L Acute Rehabilitation Services Pager: [phone] Office: [phone]

## 2019-01-21 NOTE — Evaluation (Signed)
Physical Therapy Evaluation Patient Details Name: Ellen Hill MRN: [id number] DOB: Mar 21, 1971 Today's Date: 01/21/2019   History of Present Illness  48 yo morbidly obese female presented to ER with gait instability and bil LE weakness R>L and LBP after fall. Pt with thoracic stenosis with cord compression s/p T9-L1 laminectomies 1/28. PMHx: asthma, menorrhagia, obesity  Clinical Impression  Ellen Hill is very pleasant and nervous about mobility due to pain and lack of movement of bil LE. PT with no activation of LLE and unable to sense light touch with inconsistent and sporadic response to pressure on LLE. Pt with no sensation to light touch from knee to umbilicus on RLE but can accurately sense pressure. Pt with grossly 2-/5 function of RLE with inability to bend knee in supine to assist with rolling. PT presents as incomplete SCI with decreased ability with transfers, function and strength who will benefit from acute therapy to maximize mobility, strength and function. Discussed progression and plan with pt with need for accessible environment for D/C as well as recommendation for CIR and progression of bed to chair transfers. Pt would benefit from total lift bed as well as maxisky room and discussed this with nurse.   Pt positioned in chair position in bed with pillow behind her back for upright sitting position with recommendation for this position for meals but limited to 28min at a time. Pt encouraged to continue attempting bil LE activation throughout the day and assist with mobility but aware of need for assist for all mobility at this time. Pt mobility also complicated by body habitus significantly impacting physical assist required and limiting her available ROM.    BP supine 154/78 With semi chair position 153/87  Follow Up Recommendations CIR;Supervision/Assistance - 24 hour    Equipment Recommendations  Wheelchair (measurements PT);Wheelchair cushion (measurements PT);Hospital  bed;Other (comment)(hoyer lift)    Recommendations for Other Services       Precautions / Restrictions Precautions Precautions: Back;Fall Precaution Booklet Issued: Yes (comment) Precaution Comments: obese      Mobility  Bed Mobility Overal bed mobility: Needs Assistance Bed Mobility: Rolling;Supine to Sit Rolling: Max assist;+2 for physical assistance         General bed mobility comments: max +3 assist to roll to left with rail, cues and physical assist to lift and move RLE as well as pad to rotate pelvis. Pt with painful LLE and unable to maintain. Utilized chair positioning of bed to achieve semi sitting position then 3 person assist with pad to pivot to EOB. Grossly 45 degree pivot before pt unable to tolerate due to pain and need for 3 people to return to supine. 3 person assist to slide to [facility] in trendelenburg  Transfers                 General transfer comment: unable  Ambulation/Gait                Stairs            Wheelchair Mobility    Modified Rankin (Stroke Patients Only)       Balance                                             Pertinent Vitals/Pain Pain Assessment: 0-10 Pain Score: 6  Pain Location: back Pain Descriptors / Indicators: Aching;Discomfort Pain Intervention(s): Limited activity within patient's tolerance;PCA encouraged;Monitored  during session;Repositioned    Home Living Family/patient expects to be discharged to:: Private residence Living Arrangements: Other relatives Available Help at Discharge: Family Type of Home: Apartment Home Access: Stairs to enter Entrance Stairs-Rails: Right;Left(split railing) Technical brewer of Steps: Wheeler: One level Home Equipment: None Additional Comments: pt lives with sister and 5 yo daughter    Prior Function Level of Independence: Independent         Comments: pt is a Database administrator for [employer]     Hand Dominance         Extremity/Trunk Assessment   Upper Extremity Assessment Upper Extremity Assessment: Defer to OT evaluation    Lower Extremity Assessment Lower Extremity Assessment: RLE deficits/detail;LLE deficits/detail RLE Deficits / Details: 2/5 knee and hip flexion, 2-/5 knee extension, 2-/5 dorsiflexion, PROM limited by body habitus RLE Sensation: decreased light touch(pt able to sense light touch mid calf distally and pressure throughout entire leg) RLE Coordination: decreased gross motor LLE Deficits / Details: no AROM, limited ROM due to body habitus    Cervical / Trunk Assessment Cervical / Trunk Assessment: Other exceptions Cervical / Trunk Exceptions: unable to fully clear trunk from surface, large sacral shelf  Communication   Communication: No difficulties  Cognition Arousal/Alertness: Awake/alert Behavior During Therapy: WFL for tasks assessed/performed Overall Cognitive Status: Within Functional Limits for tasks assessed                                        General Comments      Exercises     Assessment/Plan    PT Assessment Patient needs continued PT services  PT Problem List Decreased strength;Decreased balance;Decreased knowledge of precautions;Pain;Decreased range of motion;Decreased mobility;Decreased knowledge of use of DME;Obesity;Decreased activity tolerance       PT Treatment Interventions DME instruction;Functional mobility training;Balance training;Patient/family education;Therapeutic activities;Neuromuscular re-education;Wheelchair mobility training;Therapeutic exercise    PT Goals (Current goals can be found in the Care Plan section)  Acute Rehab PT Goals Patient Stated Goal: be able to return to home and work, see my daughter play ball Time For Goal Achievement: 02/04/19 Potential to Achieve Goals: Fair    Frequency Min 3X/week   Barriers to discharge Decreased caregiver support      Co-evaluation               AM-PAC PT "6  Clicks" Mobility  Outcome Measure Help needed turning from your back to your side while in a flat bed without using bedrails?: Total Help needed moving from lying on your back to sitting on the side of a flat bed without using bedrails?: Total Help needed moving to and from a bed to a chair (including a wheelchair)?: Total Help needed standing up from a chair using your arms (e.g., wheelchair or bedside chair)?: Total Help needed to walk in hospital room?: Total Help needed climbing 3-5 steps with a railing? : Total 6 Click Score: 6    End of Session   Activity Tolerance: Patient tolerated treatment well Patient left: in bed;with call bell/phone within reach Nurse Communication: Mobility status;Need for lift equipment PT Visit Diagnosis: Other abnormalities of gait and mobility (R26.89);Muscle weakness (generalized) (M62.81);Other symptoms and signs involving the nervous system (R29.898)    Time: 3244-0102 PT Time Calculation (min) (ACUTE ONLY): 35 min   Charges:   PT Evaluation $PT Eval High Complexity: 1 High  Elwyn Reach, PT Acute Rehabilitation Services Pager: [phone] Office: [place] 01/21/2019, 12:48 PM

## 2019-01-21 NOTE — Progress Notes (Signed)
Postop day 2.  Overall situation unchanged.  Patient still without voluntary movement of her left lower extremity.  Patient with 2-3/5 strength in her right lower extremity.  Still with dysesthetic pain into both lower extremities left greater than right.  Abdomen soft.  Spirits decent.  Afebrile.  Vital signs are stable.  Urine output good.  Physical exam as above.  Status post multilevel thoracic decompression.  Continue current medical therapy.  Begin mobilization.

## 2019-01-22 MED ORDER — BOOST / RESOURCE BREEZE PO LIQD CUSTOM
1.0000 | Freq: Two times a day (BID) | ORAL | Status: DC
  Administered 2019-01-23 – 2019-01-27 (×8): 1 via ORAL

## 2019-01-22 NOTE — Progress Notes (Signed)
Inpatient Rehabilitation Admissions Coordinator  I met with pt at bedside to discuss goals and expectation of an inpt rehab admit. Pt is discussing with her best friend the possibility of moving in with her at d/c. It is one level with level entry. Family and friends can provide assistance at a wheelchair level. I await further progress with therapies to tolerate the intensity of an inpt rehab admit. I will follow up on Monday.   , RN, MSN Rehab Admissions Coordinator [phone] 01/22/2019 3:45 PM  

## 2019-01-22 NOTE — Plan of Care (Signed)
  Problem: Health Behavior/Discharge Planning: Goal: Ability to manage health-related needs will improve Outcome: Not Progressing   Problem: Clinical Measurements: Goal: Respiratory complications will improve Outcome: Not Progressing   Problem: Activity: Goal: Risk for activity intolerance will decrease Outcome: Not Progressing   Problem: Nutrition: Goal: Adequate nutrition will be maintained Outcome: Not Progressing   Problem: Elimination: Goal: Will not experience complications related to bowel motility Outcome: Not Progressing Goal: Will not experience complications related to urinary retention Outcome: Not Progressing   Problem: Pain Managment: Goal: General experience of comfort will improve Outcome: Not Progressing

## 2019-01-22 NOTE — Progress Notes (Signed)
Postop day 3.  Patient's pain better controlled.  Using her PCA less.  Patient states her back pain is well controlled.  Still having some neuropathic symptoms into her left lower extremity but these are improved.  She is afebrile.  Her vital signs are stable.  Urine output is good.  Patient is awake and alert.  She is oriented and appropriate.  Motor and sensory function of her upper extremities or intact.  Lower extremity strength still with 2-3/5 strength in her right lower extremity.  Patient with some slightly increased flicker movement in her left lower extremity.  Patient with good sensation in both lower extremities.  Patient with good proprioception in both lower extremities.  Her wound is not evaluated today as it is very difficult to move her given her size.  Her abdomen is soft.  Overall progressing as would be expected.  Continue efforts at mobilization.

## 2019-01-22 NOTE — Progress Notes (Addendum)
Initial Nutrition Assessment  DOCUMENTATION CODES:   Morbid obesity  INTERVENTION:  -Boost Breeze po BID, each supplement provides 250 kcal and 9 grams of protein  -Encouraged high protein foods to assist with healing  NUTRITION DIAGNOSIS:   Inadequate oral intake related to poor appetite as evidenced by per patient/family report.  GOAL:   Patient will meet greater than or equal to 90% of their needs  MONITOR:   PO intake, Supplement acceptance  REASON FOR ASSESSMENT:   Low Braden   ASSESSMENT:   Pt with PMH of morbid obesity, lumbar radiculopathy, and asthma. 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  Spoke with pt at bedside with her brother Ellen Hill in room. 3 days s/p extensive thoracic decompressive surgery, pt was awake and alert.  Pt reported that she has had a poor appetite for 1-1.53months. Normally she will only have one meal a day and snacks on chips. She did mention that the meal she has is not a large one; often sandwiches or easy to make items. Her family has noticed that she is eating less often. S/p surgery, meal completion has been between 25-50% per chart and pt.   Because of the ongoing pain for the last 2-2.5 months, she was having a hard time going up the flight of stairs at her home. She said that once she did get up the stairs she did not want to eat and just wanted to rest. UBW is 350#; pt reported unknown amount of wt loss over the last 1-1.5 months. Per pt chart 1.7% wt loss since 12/08/18 which is not significant for the time frame.  Recommended Boost Breeze or Ensure to pt d/t prolonged poor appetite and to assist with healing. Pt agreed to Boost Breeze BID. Educated about increased protein needs d/t s/p surgery. NFPE revealed no depletion.   Medications reviewed and include: Dexamethasone 4mg  q 6hrs Labs reviewed.  NUTRITION - FOCUSED PHYSICAL EXAM:    Most Recent Value   Orbital Region  No depletion  Upper Arm Region  No depletion  Thoracic and Lumbar Region  No depletion  Buccal Region  No depletion  Temple Region  No depletion  Clavicle Bone Region  No depletion  Clavicle and Acromion Bone Region  No depletion  Scapular Bone Region  No depletion  Dorsal Hand  No depletion  Patellar Region  No depletion  Anterior Thigh Region  No depletion  Posterior Calf Region  No depletion  Edema (RD Assessment)  Mild [lower r/l left]  Hair  Reviewed  Eyes  Reviewed  Mouth  Reviewed  Skin  Reviewed  Nails  Reviewed      Diet Order:   Diet Order            Diet regular Room service appropriate? Yes; Fluid consistency: Thin  Diet effective now              EDUCATION NEEDS:   Education needs have been addressed  Skin:  Skin Assessment: Skin Integrity Issues: Skin Integrity Issues:: Incisions Incisions: Closed incision on back  Last BM:  1/27  Height:   Ht Readings from Last 1 Encounters:  01/17/19 5\' 6"  (1.676 m)    Weight:   Wt Readings from Last 1 Encounters:  01/17/19 (!) 182 kg    Ideal Body Weight:  59.09 kg  BMI:  Body mass index is 64.76 kg/m.  Estimated Nutritional Needs:   Kcal:  1700-1900 kcal  Protein:  85-95 g  Fluid:  >/= 1.7L    [place] Intern

## 2019-01-23 ENCOUNTER — Other Ambulatory Visit: Payer: Self-pay

## 2019-01-23 NOTE — Progress Notes (Signed)
Postop day 4.  Back pain well controlled.  Neuropathic pain into left lower extremity improved.  Patient states that she is beginning to get increased sensation in her lower abdomen and proximal thighs.  She is afebrile.  Her vital signs are stable.  She is awake and alert.  She is oriented and appropriate.  Motor and sensory function of her upper extremities normal.  Patient with 2-3/5 strength in right lower extremity.  Patient with some increased contraction in her left lower extremity with attempts at voluntary movement but no definite movement as of yet.  Patient still with proprioception bilaterally.  Less dysesthetic pain in her left lower extremity today.  Wound impossible to examine secondary to patient size.  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  Continue supportive management.  Continue efforts at mobilization.  Hopefully lower extremity strength will continue to gradually improve.

## 2019-01-24 NOTE — Progress Notes (Signed)
Wasted approximately 44mL of dilaudid PCA with Loleta Dicker, RN and replaced with new dilaudid PCA syringe.

## 2019-01-24 NOTE — Progress Notes (Signed)
2246: PCA Discontinued. 34mL wasted in Stericycle w/ Regino Schultze, RN witness.

## 2019-01-24 NOTE — Progress Notes (Signed)
   Providing Compassionate, Quality Care - Together   Subjective: Patient reports improved sensation in her abdomen and lower extremities. She is also able to move her lower extremities more than yesterday. She reports having a bowel movement overnight. Pain is well-controlled.  Objective: Vital signs in last 24 hours: Temp:  [98.1 F (36.7 C)-99.2 F (37.3 C)] 99.2 F (37.3 C) (02/02 0755) Pulse Rate:  [86-95] 86 (02/02 0755) Resp:  [15-20] 20 (02/02 0755) BP: (140-156)/(90-103) 147/93 (02/02 0755) SpO2:  [99 %-100 %] 100 % (02/02 0755)  Intake/Output from previous day: 02/01 0701 - 02/02 0700 In: 1032.1 [I.V.:1032.1] Out: 9767 [Urine:3475] Intake/Output this shift: Total I/O In: 360 [P.O.:360] Out: 1250 [Urine:1250]  Alert and oriented x 4 Motor and sensory function of BUE intact Strength RLE 3/5, LLE 1/5 Sensation improving in BLE Abdomen soft Unable to visualize patient's incision at this time  Assessment/Plan: Ms. Towell is 5 days status post T9, T10, T11, T12, and partial L1 decompressive laminectomies. Weakness and dysthesias slowly improving since surgery. Foley maintained due to bladder dysfunction. Therapies have begun working with her.   LOS: 7 days    -Discontinue PCA, utilize PO pain medication -Continue working with therapies and mobilizing patient. They are recommending CIR -Maintain foley catheter  Viona Gilmore, DNP, AGNP-C Nurse Practitioner  [facility] [place] [address], [address], [place], [place] [address] P: [phone]    F: [phone]  01/24/2019, 11:20 AM

## 2019-01-25 NOTE — Progress Notes (Signed)
Overall stable.  Pain well controlled.  Continues to feel that she is regaining sensation in both legs.  Afebrile.  Vital signs are stable.  Wound still not able to be examined secondary to immobility.  Examination of her motor function reveals stable 2-3/5 strength in her right lower extremity.  She has some continued slight contraction in her left lower extremity.  Sensory examination stable.  She still has preserved proprioception in both feet.  Continue current management.  Continue therapy and rehab efforts.

## 2019-01-25 NOTE — Progress Notes (Signed)
Occupational Therapy Treatment Patient Details Name: Ellen Hill MRN: [id number] DOB: [birth year] Today's Date: 01/25/2019    History of present illness 48 yo morbidly obese female presented to ER with gait instability and bil LE weakness R>L and LBP after fall. Pt with thoracic stenosis with cord compression s/p T9-L1 laminectomies 1/28. PMHx: asthma, menorrhagia, obesity   OT comments  Pt progressing towards OT goals this session. Pt was able to improve in rolling, requiring mod A +2 for rolling to place maxisky sling. Pt initially anxious with lift to recliner, but THRILLED once upright. Able to participate in exercises/sensation testing for BLE. Pt educated for pressure relief and need to reposition every 30 min. RN aware of above and need for lift back with tilt bed ordered. Current POC remains appropriate and essential for safety and to maximize safety and independence. Next session to bring theraband and establish HEP for BUE.    Follow Up Recommendations  CIR;Supervision/Assistance - 24 hour    Equipment Recommendations  Other (comment);Tub/shower bench;Wheelchair (measurements OT);Wheelchair cushion (measurements OT);3 in 1 bedside commode(defer to next venue of care; will need wide/bari equipment)    Recommendations for Other Services      Precautions / Restrictions Precautions Precautions: Back;Fall Precaution Booklet Issued: Yes (comment) Precaution Comments: obese Restrictions Weight Bearing Restrictions: No       Mobility Bed Mobility Overal bed mobility: Needs Assistance Bed Mobility: Rolling;Supine to Sit Rolling: Max assist;+2 for physical assistance   Supine to sit: Total assist;+2 for physical assistance     General bed mobility comments: max +3 assist to roll to right with rail, cues and physical assist to lift and move LLE as well as pad to rotate pelvis. Pt with painful LLE and required mod assist to maintain. Max +2 to roll to left with cues and  assist to bend and move RLE. Placed sling with rolling and able to use maxisky to transfer from supine to sit.   Transfers                 General transfer comment: +3 assist for lift from bed to chair and positioning in sitting in bariatric recliner with pillows in chair for pressure relief with back supported upright by pillows    Balance Overall balance assessment: Needs assistance Sitting-balance support: Bilateral upper extremity supported   Sitting balance - Comments: in chair with feet supported on floor pt min assist to use bil UE and lean forward, back support/unsupported with no LOB with bil UE support                                   ADL either performed or assessed with clinical judgement   ADL Overall ADL's : Needs assistance/impaired     Grooming: Set up;Bed level;Wash/dry hands;Wash/dry face;Sitting Grooming Details (indicate cue type and reason): sitting in recliner at end of session                 Toilet Transfer: Total assistance Toilet Transfer Details (indicate cue type and reason): maxi sky used Toileting- Clothing Manipulation and Hygiene: Total assistance Toileting - Clothing Manipulation Details (indicate cue type and reason): foley still in place             Vision   Vision Assessment?: No apparent visual deficits   Perception     Praxis      Cognition Arousal/Alertness: Awake/alert Behavior During Therapy: WFL for tasks assessed/performed  Overall Cognitive Status: Within Functional Limits for tasks assessed                                          Exercises General Exercises - Lower Extremity Short Arc Quad: AAROM;PROM;10 reps;Seated;Right;Left(PROM LLE) Hip Flexion/Marching: PROM;AAROM;10 reps;Seated;Right;Left(PROM LLE)   Shoulder Instructions       General Comments      Pertinent Vitals/ Pain       Pain Assessment: 0-10 Pain Score: 5  Pain Location: back, LLE with movement Pain  Descriptors / Indicators: Aching;Discomfort Pain Intervention(s): Limited activity within patient's tolerance;Monitored during session;Repositioned  Home Living                                          Prior Functioning/Environment              Frequency  Min 3X/week        Progress Toward Goals  OT Goals(current goals can now be found in the care plan section)  Progress towards OT goals: Progressing toward goals  Acute Rehab OT Goals Patient Stated Goal: be able to return to home and work, see my daughter play ball OT Goal Formulation: With patient Time For Goal Achievement: 02/04/19 Potential to Achieve Goals: Good  Plan Discharge plan remains appropriate;Frequency remains appropriate    Co-evaluation    PT/OT/SLP Co-Evaluation/Treatment: Yes Reason for Co-Treatment: Complexity of the patient's impairments (multi-system involvement);For patient/therapist safety;To address functional/ADL transfers PT goals addressed during session: Mobility/safety with mobility;Strengthening/ROM OT goals addressed during session: ADL's and self-care;Strengthening/ROM      AM-PAC OT "6 Clicks" Daily Activity     Outcome Measure   Help from another person eating meals?: A Little Help from another person taking care of personal grooming?: A Little Help from another person toileting, which includes using toliet, bedpan, or urinal?: Total Help from another person bathing (including washing, rinsing, drying)?: A Lot Help from another person to put on and taking off regular upper body clothing?: A Lot Help from another person to put on and taking off regular lower body clothing?: Total 6 Click Score: 12    End of Session Equipment Utilized During Treatment: (hovermat, maxi sky)  OT Visit Diagnosis: Muscle weakness (generalized) (M62.81);History of falling (Z91.81);Other abnormalities of gait and mobility (R26.89);Pain Pain - Right/Left: Left Pain - part of body:  Leg(back (icision))   Activity Tolerance Patient tolerated treatment well   Patient Left in chair;with call bell/phone within reach   Nurse Communication Mobility status;Need for lift equipment        Time: 1004-1045 OT Time Calculation (min): 41 min  Charges: OT General Charges $OT Visit: 1 Visit OT Treatments $Self Care/Home Management : 8-22 mins  Hulda Humphrey OTR/L Acute Rehabilitation Services Pager: [phone] Office: [place] 01/25/2019, 3:22 PM

## 2019-01-25 NOTE — Progress Notes (Signed)
Physical Therapy Treatment Patient Details Name: Ellen Hill MRN: [id number] DOB: September 18, 1971 Today's Date: 01/25/2019    History of Present Illness 48 yo morbidly obese female presented to ER with gait instability and bil LE weakness R>L and LBP after fall. Pt with thoracic stenosis with cord compression s/p T9-L1 laminectomies 1/28. PMHx: asthma, menorrhagia, obesity    PT Comments    Pt very pleasant and eager to progress mobility. Pt with decreased pain this session with ability to roll with decreased assistance and tolerate OOB to chair via maxisky. PT reports increased comfort in chair with pt positioned in fully upright with back supported and feet on floor. Pt educated for pressure relief and need to reposition every 30 min. RN aware of above and need for lift back with tilt bed ordered. Pt with increased strength RLE with 2/5 strength grossly and trace activation of left hip abductors/ adductors as well as hamstring with light touch only intact on left foot and throughout rLE today. Will continue to follow and recommended continued work on activating bil LE and assisting with bed mobility. Pt very appreciative for OOB this session.   BP 123/107 in chair    Follow Up Recommendations  CIR;Supervision/Assistance - 24 hour     Equipment Recommendations  Wheelchair (measurements PT);Wheelchair cushion (measurements PT);Hospital bed;Other (comment)    Recommendations for Other Services       Precautions / Restrictions Precautions Precautions: Back;Fall Precaution Booklet Issued: Yes (comment) Precaution Comments: obese Restrictions Weight Bearing Restrictions: No    Mobility  Bed Mobility Overal bed mobility: Needs Assistance Bed Mobility: Rolling;Supine to Sit Rolling: Max assist;+2 for physical assistance   Supine to sit: Total assist;+2 for physical assistance     General bed mobility comments: max +3 assist to roll to right with rail, cues and physical assist to  lift and move LLE as well as pad to rotate pelvis. Pt with painful LLE and required mod assist to maintain. Max +2 to roll to left with cues and assist to bend and move RLE. Placed sling with rolling and able to use maxisky to transfer from supine to sit.   Transfers                 General transfer comment: +3 assist for lift from bed to chair and positioning in sitting in bariatric recliner with pillows in chair for pressure relief with back supported upright by pillows  Ambulation/Gait             General Gait Details: unable   Stairs             Wheelchair Mobility    Modified Rankin (Stroke Patients Only)       Balance Overall balance assessment: Needs assistance Sitting-balance support: Bilateral upper extremity supported   Sitting balance - Comments: in chair with feet supported on floor pt min assist to use bil UE and lean forward, back support/unsupported with no LOB with bil UE support                                    Cognition Arousal/Alertness: Awake/alert Behavior During Therapy: WFL for tasks assessed/performed Overall Cognitive Status: Within Functional Limits for tasks assessed  Exercises General Exercises - Lower Extremity Short Arc Quad: AAROM;PROM;10 reps;Seated;Right;Left(PROM LLE) Hip Flexion/Marching: PROM;AAROM;10 reps;Seated;Right;Left(PROM LLE)    General Comments        Pertinent Vitals/Pain Pain Assessment: 0-10 Pain Score: 5  Pain Location: back, LLE with movement Pain Descriptors / Indicators: Aching;Discomfort Pain Intervention(s): Limited activity within patient's tolerance;Monitored during session;Repositioned    Home Living                      Prior Function            PT Goals (current goals can now be found in the care plan section) Progress towards PT goals: Progressing toward goals    Frequency    Min 4X/week       PT Plan Current plan remains appropriate    Co-evaluation PT/OT/SLP Co-Evaluation/Treatment: Yes Reason for Co-Treatment: Complexity of the patient's impairments (multi-system involvement);Necessary to address cognition/behavior during functional activity;For patient/therapist safety PT goals addressed during session: Mobility/safety with mobility;Strengthening/ROM        AM-PAC PT "6 Clicks" Mobility   Outcome Measure  Help needed turning from your back to your side while in a flat bed without using bedrails?: A Lot Help needed moving from lying on your back to sitting on the side of a flat bed without using bedrails?: Total Help needed moving to and from a bed to a chair (including a wheelchair)?: Total Help needed standing up from a chair using your arms (e.g., wheelchair or bedside chair)?: Total Help needed to walk in hospital room?: Total Help needed climbing 3-5 steps with a railing? : Total 6 Click Score: 7    End of Session   Activity Tolerance: Patient tolerated treatment well Patient left: in chair;with call bell/phone within reach Nurse Communication: Mobility status;Need for lift equipment PT Visit Diagnosis: Other abnormalities of gait and mobility (R26.89);Muscle weakness (generalized) (M62.81);Other symptoms and signs involving the nervous system (R29.898)     Time: 0569-7948 PT Time Calculation (min) (ACUTE ONLY): 41 min  Charges:  $Therapeutic Activity: 23-37 mins                     [place], PT Acute Rehabilitation Services Pager: [phone] Office: [place] 01/25/2019, 11:44 AM

## 2019-01-26 MED ORDER — METOPROLOL TARTRATE 5 MG/5ML IV SOLN
INTRAVENOUS | Status: AC
  Filled 2019-01-26: qty 5

## 2019-01-26 NOTE — Care Management Note (Signed)
Case Management Note  Patient Details  Name: Ellen Hill MRN: [id number] Date of Birth: July 23, 1971  Subjective/Objective: 48 yo morbidly obese female presented to ER with gait instability and bil LE weakness R>L and LBP after fall. Pt with thoracic stenosis with cord compression s/p T9-L1 laminectomies 1/28.  PTA, pt independent, lives at home with sister and 63 yo daughter.                   Action/Plan: PT/OT recommending CIR, and consult in process.  Awaiting further progression with therapies prior to moving forward with insurance auth for CIR.    Expected Discharge Date:                  Expected Discharge Plan:  [place]  In-House Referral:     Discharge planning Services  CM Consult  Post Acute Care Choice:    Choice offered to:     DME Arranged:    DME Agency:     HH Arranged:    [place] Agency:     Status of Service:  In process, will continue to follow  If discussed at Long Length of Stay Meetings, dates discussed:    Additional Comments:  Reinaldo Raddle, RN, BSN  Trauma/Neuro ICU Case Manager [phone]

## 2019-01-26 NOTE — Progress Notes (Signed)
Physical Therapy Treatment Patient Details Name: Ellen Hill MRN: [id number] DOB: [birth year] Today's Date: 01/26/2019    History of Present Illness 48 yo morbidly obese female presented to ER with gait instability and bil LE weakness R>L and LBP after fall. Pt with thoracic stenosis with cord compression s/p T9-L1 laminectomies 1/28. PMHx: asthma, menorrhagia, obesity    PT Comments    Pt very pleasant and ready to mobilize pt reports fatigue after being OOB yesterday but tolerated for 2 hrs with pressure relief and reposition every 30 min. Pt able to demonstrate trace activation of left quads, hip flexors and hamstrings today with increased sensation to pressure but light touch only intact on left foot. Pt with slowly improving RLE strength maintaining 2-/5 at best on RLE. Pt educated for positioning and HEP with RN educated for time limits on sitting. Continue to await tilt bed arrival.     Follow Up Recommendations  CIR;Supervision/Assistance - 24 hour     Equipment Recommendations  Wheelchair (measurements PT);Wheelchair cushion (measurements PT);Hospital bed;Other (comment)    Recommendations for Other Services       Precautions / Restrictions Precautions Precautions: Back;Fall Precaution Comments: obese    Mobility  Bed Mobility Overal bed mobility: Needs Assistance Bed Mobility: Rolling;Supine to Sit Rolling: Max assist;+2 for physical assistance         General bed mobility comments: max +3 assist to roll to right with rail, cues and physical assist to lift and move LLE as well as pad to rotate pelvis. Pt with painful LLE and required mod assist to maintain. Max +2 to roll to left with cues and assist to bend and move RLE. Placed sling with rolling and able to use maxisky to transfer from supine to sit.   Transfers                 General transfer comment: +3 assist for lift from bed to chair and positioning in sitting in bariatric recliner with pillows  in chair for pressure relief with back supported upright by pillows  Ambulation/Gait             General Gait Details: unable   Stairs             Wheelchair Mobility    Modified Rankin (Stroke Patients Only)       Balance                                            Cognition Arousal/Alertness: Awake/alert Behavior During Therapy: WFL for tasks assessed/performed Overall Cognitive Status: Within Functional Limits for tasks assessed                                        Exercises General Exercises - Lower Extremity Short Arc Quad: AAROM;PROM;10 reps;Seated;Right;Left(PROM of LLE with trace quad activation) Hip Flexion/Marching: PROM;AAROM;10 reps;Seated;Right;Left(PROM LLE with trace hip flexor activation)    General Comments        Pertinent Vitals/Pain Pain Score: 4  Pain Location: back, LLE with movement Pain Descriptors / Indicators: Aching;Discomfort Pain Intervention(s): Limited activity within patient's tolerance;Premedicated before session;Monitored during session;Repositioned    Home Living                      Prior Function  PT Goals (current goals can now be found in the care plan section) Progress towards PT goals: Progressing toward goals    Frequency    Min 4X/week      PT Plan Current plan remains appropriate    Co-evaluation              AM-PAC PT "6 Clicks" Mobility   Outcome Measure  Help needed turning from your back to your side while in a flat bed without using bedrails?: A Lot Help needed moving from lying on your back to sitting on the side of a flat bed without using bedrails?: Total Help needed moving to and from a bed to a chair (including a wheelchair)?: Total Help needed standing up from a chair using your arms (e.g., wheelchair or bedside chair)?: Total Help needed to walk in hospital room?: Total Help needed climbing 3-5 steps with a railing? :  Total 6 Click Score: 7    End of Session   Activity Tolerance: Patient tolerated treatment well Patient left: in chair;with call bell/phone within reach Nurse Communication: Mobility status;Need for lift equipment PT Visit Diagnosis: Other abnormalities of gait and mobility (R26.89);Muscle weakness (generalized) (M62.81);Other symptoms and signs involving the nervous system (R29.898)     Time: 0802-0826 PT Time Calculation (min) (ACUTE ONLY): 24 min  Charges:  $Therapeutic Exercise: 8-22 mins $Therapeutic Activity: 8-22 mins                     [place], PT Acute Rehabilitation Services Pager: [phone] Office: [place] 01/26/2019, 9:32 AM

## 2019-01-26 NOTE — Progress Notes (Signed)
   Providing Compassionate, Quality Care - Together   Subjective: Patient reports lower extremity paresthesias are continuing to improve. She is up to chair upon assessment and reports that she has been up for three hours. She is pleased with her progress.  Objective: Vital signs in last 24 hours: Temp:  [98.4 F (36.9 C)-98.9 F (37.2 C)] 98.4 F (36.9 C) (02/04 0719) Pulse Rate:  [96-108] 98 (02/04 0719) Resp:  [16-20] 20 (02/04 0719) BP: (127-138)/(66-99) 138/89 (02/04 0719) SpO2:  [100 %] 100 % (02/04 0719)  Intake/Output from previous day: 02/03 0701 - 02/04 0700 In: 837 [P.O.:837] Out: 1250 [Urine:1250] Intake/Output this shift: Total I/O In: 243 [P.O.:240; I.V.:3] Out: -   Alert and oriented x 4 Motor and sensory function of BUE intact Strength RLE 3/5, LLE 1/5 Sensation improving in BLE Abdomen soft Incision clean, dry, and intact  Lab Results: No results for input(s): WBC, HGB, HCT, PLT in the last 72 hours. BMET No results for input(s): NA, K, CL, CO2, GLUCOSE, BUN, CREATININE, CALCIUM in the last 72 hours.  Studies/Results: No results found.  Assessment/Plan: Ellen Hill is 7 days status post T9, T10, T11, T12, and partial L1 decompressive laminectomies. Weakness and dysthesias slowly improving since surgery. Foley maintained due to bladder dysfunction. Therapies are working with her.   LOS: 9 days    -Continue working with therapies and mobilizing patient. They are recommending CIR -Maintain foley catheter   Ellen Gilmore, DNP, AGNP-C Nurse Practitioner  [facility] [place] [address], [place], [place] [address] P: [phone]    F: [phone]  01/26/2019, 11:35 AM

## 2019-01-26 NOTE — Progress Notes (Signed)
Inpatient Rehabilitation Admissions Coordinator  I met with patient with his sister at bedside. Pt in recliner. I await further progress in therapies before proceeding with insurance authorization for a possible inpt rehab admit. Discussed with PT and RN CM.  Danne Baxter, RN, MSN Rehab Admissions Coordinator [phone] 01/26/2019 11:53 AM

## 2019-01-27 LAB — URINALYSIS, ROUTINE W REFLEX MICROSCOPIC
Bilirubin Urine: NEGATIVE
Glucose, UA: NEGATIVE mg/dL
Ketones, ur: NEGATIVE mg/dL
Nitrite: NEGATIVE
Protein, ur: 100 mg/dL — AB
RBC / HPF: >50 RBC/hpf — ABNORMAL HIGH (ref 0–5)
Specific Gravity, Urine: 1.033 — ABNORMAL HIGH (ref 1.005–1.030)
WBC, UA: >50 WBC/hpf — ABNORMAL HIGH (ref 0–5)
pH: 7 (ref 5.0–8.0)

## 2019-01-27 LAB — CBC
HCT: 35.1 % — ABNORMAL LOW (ref 36.0–46.0)
Hemoglobin: 11.1 g/dL — ABNORMAL LOW (ref 12.0–15.0)
MCH: 23.7 pg — ABNORMAL LOW (ref 26.0–34.0)
MCHC: 31.6 g/dL (ref 30.0–36.0)
MCV: 74.8 fL — ABNORMAL LOW (ref 80.0–100.0)
Platelets: 180 10*3/uL (ref 150–400)
RBC: 4.69 MIL/uL (ref 3.87–5.11)
RDW: 16.7 % — ABNORMAL HIGH (ref 11.5–15.5)
WBC: 20.5 10*3/uL — ABNORMAL HIGH (ref 4.0–10.5)
nRBC: 0 % (ref 0.0–0.2)

## 2019-01-27 MED ORDER — ALUM & MAG HYDROXIDE-SIMETH 200-200-20 MG/5ML PO SUSP
30.0000 mL | Freq: Four times a day (QID) | ORAL | Status: DC | PRN
  Administered 2019-01-27: 30 mL via ORAL
  Filled 2019-01-27: qty 30

## 2019-01-27 MED ORDER — DEXAMETHASONE SODIUM PHOSPHATE 4 MG/ML IJ SOLN: 4.0000 mg | Freq: Two times a day (BID) | INTRAMUSCULAR | Status: DC

## 2019-01-27 MED ORDER — SULFAMETHOXAZOLE-TRIMETHOPRIM 800-160 MG PO TABS
1.0000 | ORAL_TABLET | Freq: Two times a day (BID) | ORAL | Status: DC
  Administered 2019-01-27 – 2019-01-29 (×4): 1 via ORAL
  Filled 2019-01-27 (×6): qty 1

## 2019-01-27 MED ORDER — DEXAMETHASONE 2 MG PO TABS
2.0000 mg | ORAL_TABLET | Freq: Two times a day (BID) | ORAL | Status: DC
  Administered 2019-01-27 – 2019-01-29 (×4): 2 mg via ORAL
  Filled 2019-01-27 (×4): qty 1

## 2019-01-27 NOTE — Progress Notes (Signed)
Overall stable.  Patient looks a little bit more puny and worn out today.  Pain well controlled.  Increasing strength in her left lower extremity is certainly positive.  She is having no new radicular pain.  She does note some mild headache.  She has had some difficulty with her urinary catheter becoming clogged and having leakage around the catheter site.  Awake and alert.  Oriented and appropriate.  Motor examination with 2-3/5 strength in her right lower extremity.  Left lower extremity with good voluntary plantarflexion on the left grading out at 2/5.  She also has some internal rotation of her left hip and she has some weak extension of her left knee.  Wound the last 2 days has been examined.  It is clean and dry and appears to be healing well.  Overall progressing about as well as could be hoped.  Check blood work and urinalysis.  Continue rehab efforts.

## 2019-01-28 LAB — CBC WITH DIFFERENTIAL/PLATELET
Abs Immature Granulocytes: 0.43 10*3/uL — ABNORMAL HIGH (ref 0.00–0.07)
BASOS PCT: 0 %
Basophils Absolute: 0 10*3/uL (ref 0.0–0.1)
EOS ABS: 0 10*3/uL (ref 0.0–0.5)
Eosinophils Relative: 0 %
HCT: 36.7 % (ref 36.0–46.0)
Hemoglobin: 11 g/dL — ABNORMAL LOW (ref 12.0–15.0)
Immature Granulocytes: 2 %
Lymphocytes Relative: 15 %
Lymphs Abs: 2.6 10*3/uL (ref 0.7–4.0)
MCH: 22.9 pg — ABNORMAL LOW (ref 26.0–34.0)
MCHC: 30 g/dL (ref 30.0–36.0)
MCV: 76.5 fL — ABNORMAL LOW (ref 80.0–100.0)
Monocytes Absolute: 1.6 10*3/uL — ABNORMAL HIGH (ref 0.1–1.0)
Monocytes Relative: 9 %
NEUTROS PCT: 74 %
NRBC: 0 % (ref 0.0–0.2)
Neutro Abs: 13 10*3/uL — ABNORMAL HIGH (ref 1.7–7.7)
PLATELETS: 182 10*3/uL (ref 150–400)
RBC: 4.8 MIL/uL (ref 3.87–5.11)
RDW: 16.6 % — ABNORMAL HIGH (ref 11.5–15.5)
WBC: 17.7 10*3/uL — AB (ref 4.0–10.5)

## 2019-01-28 NOTE — Progress Notes (Signed)
Looks a little better today.  Patient states she feels better.  Pain well controlled.  Patient is having some difficulty with loose stools then urinary incontinence.  Afebrile.  Vital signs are stable.  Awake and alert.  Oriented and appropriate.  Upper extremity strength 5/5.  Lower extremity strength right 2-3/5 left with some increasing plantarflexion and some hip flexion and knee extension grading out at 1-2/5.  Sensory examination stable.  Wound clean and dry.  Abdomen soft.  Progressing reasonably well.  Continue efforts at mobilization and rehab.

## 2019-01-28 NOTE — Progress Notes (Addendum)
Occupational Therapy Treatment Patient Details Name: Ellen Hill MRN: [id number] DOB: [birth year] Today's Date: 01/28/2019    History of present illness 48 yo morbidly obese female presented to ER with gait instability and bil LE weakness R>L and LBP after fall. Pt with thoracic stenosis with cord compression s/p T9-L1 laminectomies 1/28. PMHx: asthma, menorrhagia, obesity   OT comments  Pt presents supine in bed willing to participate in therapy session. Use of maxisky to transition pt from hospital bed to Vital Go tilt bed. Pt requiring two-three person assist with bed mobility for placement of lift pad. Pt tolerating two rounds of tiling with vital go bed. Overall tolerates up to 30 degrees well and able to tolerate approx 10 min this session. Trialed tilting past 30 degrees to approx 45 degrees, pt able to withstand but only briefly due to LLE buckling, requiring return to supine to reposition. Repositioned straps to better stabilize L knee during second round but continues to require external support from therapist to prevent LLE buckle. Pt nervous about tilting given this was her first time doing so but overall tolerated well. She maintains good motivation to work and progress with therapy. Continue to recommend CIR level services at time of discharge. Will follow. BP during tilt is as follows below.   BP supine 128/85 131/108 at 30 degree tilt 111/62 at 45 degrees    Follow Up Recommendations  CIR;Supervision/Assistance - 24 hour    Equipment Recommendations  Other (comment)(TBD in next venue of care)    Recommendations for Other Services Rehab consult    Precautions / Restrictions Precautions Precautions: Back;Fall Precaution Comments: obese Restrictions Weight Bearing Restrictions: No       Mobility Bed Mobility Overal bed mobility: Needs Assistance Bed Mobility: Rolling Rolling: Max assist;+2 for physical assistance         General bed mobility comments: max +3  assist to roll to right with rail, cues and physical assist to lift and move LLE as well as pad to rotate pelvis. Pt with painful LLE and required mod assist to maintain. Max +2 to roll to left with cues and assist to bend and move RLE. Placed sling with rolling and able to use maxisky to transfer from regular bed to bari total lift bed  Transfers                 General transfer comment: +3 assist for lift from bed to vital go tilt bed, use of vital go tilt bed with tilting features this session     Balance                                           ADL either performed or assessed with clinical judgement   ADL Overall ADL's : Needs assistance/impaired Eating/Feeding: Set up;Sitting Eating/Feeding Details (indicate cue type and reason): supported sitting at bed level                         Toileting- Clothing Manipulation and Hygiene: Total assistance Toileting - Clothing Manipulation Details (indicate cue type and reason): currently with flexi seal       General ADL Comments: assisted with transition  to vital go tild bed and completing initial tilt, pt tolerating well     Vision       Perception     Praxis  Cognition Arousal/Alertness: Awake/alert Behavior During Therapy: WFL for tasks assessed/performed Overall Cognitive Status: Within Functional Limits for tasks assessed                                          Exercises General Exercises - Lower Extremity Quad Sets: AROM;10 reps;Standing(tilt 30 degrees x 2 trials)   Shoulder Instructions       General Comments      Pertinent Vitals/ Pain       Pain Assessment: 0-10 Pain Score: 5  Pain Location: back, LLE with movement Pain Descriptors / Indicators: Aching;Discomfort Pain Intervention(s): Limited activity within patient's tolerance;Premedicated before session;Monitored during session;Repositioned  Home Living                                           Prior Functioning/Environment              Frequency  Min 3X/week        Progress Toward Goals  OT Goals(current goals can now be found in the care plan section)  Progress towards OT goals: Progressing toward goals  Acute Rehab OT Goals Patient Stated Goal: be able to return to home and work, see my daughter play ball OT Goal Formulation: With patient Time For Goal Achievement: 02/04/19 Potential to Achieve Goals: Good  Plan Discharge plan remains appropriate;Frequency remains appropriate    Co-evaluation    PT/OT/SLP Co-Evaluation/Treatment: Yes Reason for Co-Treatment: Complexity of the patient's impairments (multi-system involvement);Necessary to address cognition/behavior during functional activity;For patient/therapist safety PT goals addressed during session: Mobility/safety with mobility;Strengthening/ROM        AM-PAC OT "6 Clicks" Daily Activity     Outcome Measure   Help from another person eating meals?: A Little Help from another person taking care of personal grooming?: A Little Help from another person toileting, which includes using toliet, bedpan, or urinal?: Total Help from another person bathing (including washing, rinsing, drying)?: A Lot Help from another person to put on and taking off regular upper body clothing?: A Lot Help from another person to put on and taking off regular lower body clothing?: Total 6 Click Score: 12    End of Session Equipment Utilized During Treatment: Other (comment)(vital go bed)  OT Visit Diagnosis: Muscle weakness (generalized) (M62.81);History of falling (Z91.81);Other abnormalities of gait and mobility (R26.89);Pain Pain - Right/Left: Left Pain - part of body: Leg(lower back)   Activity Tolerance Patient tolerated treatment well   Patient Left in bed;with call bell/phone within reach   Nurse Communication Mobility status;Need for lift equipment        Time: 4132-4401 OT Time  Calculation (min): 66 min  Charges: OT General Charges $OT Visit: 1 Visit OT Treatments $Self Care/Home Management : 8-22 mins $Therapeutic Activity: 8-22 mins  Lou Cal, OT Supplemental Rehabilitation Services Pager [phone] Office [place] 01/28/2019, 12:51 PM

## 2019-01-28 NOTE — Progress Notes (Signed)
Flexiseal was removed today.  Diarrhea leaking out the sides. I do not want to cause any intestinal damage because of her weight and the inability to eliminate her bowel movements properly. Flexiseal is unable to drain with the pressure of her weight. Still no movement in both legs. Marlene Bast, RN

## 2019-01-28 NOTE — H&P (Signed)
Physical Medicine and Rehabilitation Admission H&P    Chief Complaint  Patient presents with  . Back Pain  : HPI: Ellen Hill is a 48 year old right-handed female with history of migraine headaches, obesity with BMI 64.76. Per chart review patient lives with her sister 22 year old daughter. One level apartment with multiple steps to entry. 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 Sister works during the day. She has a neighbor friend who planned to assist she would stay with on discharge with accessible home. Presented 01/17/2019 with gait instability bilateral lower extremity weakness 2-1/2 months to the point she was essentially nonambulatory. 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 Patient underwent T9, T10, T11-12 and partial L1 decompressive laminotomies 01/19/2019 per Dr. Annette Stable. No back brace required. Surgical PCR screening positive for contact precautions. Hospital course ongoing pain management. Decadron protocol as indicated. Elevated WBC 20,500 decreased to 17,700 monitored closely while on steroid therapy with urine culture completed 01/27/2019 greater 100,000 Proteus and 50,000 Escherichia coli and currently maintained on Bactrim. Bouts of diarrhea noted patient initially did have a a rectal tube in  place and since has been removed.Therapy evaluations completed with recommendations of physical medicine rehabilitation consult. Patient was admitted for a comprehensive rehabilitation program.  Review of Systems  Constitutional: Positive for malaise/fatigue. Negative for fever.  HENT: Negative for hearing loss.   Eyes: Negative for blurred vision and double vision.  Respiratory: Positive for shortness of breath.   Cardiovascular: Positive for leg swelling. Negative for chest pain.  Gastrointestinal: Positive  for diarrhea. Negative for nausea and vomiting.  Genitourinary: Negative for dysuria and hematuria.  Musculoskeletal: Positive for back pain and myalgias.  Neurological: Positive for weakness.  All other systems reviewed and are negative.  Past Medical History:  Diagnosis Date  . Abnormal Pap smear    history  . Anemia   . ASCUS (atypical squamous cells of undetermined significance) on Pap smear 2008   At 36wk of pregnancy ; colpo  . Asthma    rarely uses inhaler (seasonal)  . 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 Fibroids   . H/O candidiasis   . H/O fatigue 08/2007  . H/O varicella   . Headache(784.0)    migraines   . Heart murmur    dx child - no problems as adult  . Hx: UTI (urinary tract infection) 07/29/07  . Increased BMI   . Irregular bleeding   . Migraines   . Obesity 02/13/12  . Pelvic pain 02/13/12  . Postpartum anemia 08/18/07  . 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 after loss of parents, is fine  . Vaginal Pap smear, abnormal    f/u wnl   Past Surgical History:  Procedure Laterality Date  . CERVICAL POLYPECTOMY  05/01/2012   Procedure: CERVICAL POLYPECTOMY;  Surgeon: Betsy Coder, MD;  Location: [facility];  Service: Gynecology;  Laterality: N/A;  . CESAREAN SECTION  07/04/2007  . CHOLECYSTECTOMY  07/06/2013  . CHOLECYSTECTOMY N/A 07/06/2013   Procedure: LAPAROSCOPIC CHOLECYSTECTOMY ;  Surgeon: Gayland Curry, MD;  Location: [facility];  Service: General;  Laterality: N/A;  attempted cholangiogram  . fibroidecto    . fibroidectomy  07/04/2007  . LUMBAR LAMINECTOMY/DECOMPRESSION MICRODISCECTOMY N/A 01/19/2019   Procedure: Thoracic Nine-Lumbar One Decompressive Laminectomy;  Surgeon: Earnie Larsson, MD;  Location: [facility];  Service: Neurosurgery;  Laterality:  N/A;  posterior  . MYOMECTOMY  07/04/08  . OPERATIVE HYSTEROSCOPY  05/01/2012   no menses since that time  . TUBAL LIGATION  07/04/2007   Family History  Problem Relation Age of Onset  . Hypertension Mother   . Cancer Mother          Breast  . Breast cancer Mother   . Hypertension Father   . Cancer Father        Prostate  . Heart attack Father   . Hypertension Sister   . Diabetes Maternal Aunt    Social History:  reports that she quit smoking about 19 years ago. Her smoking use included cigarettes. She has a 0.30 pack-year smoking history. She has never used smokeless tobacco. She reports that she does not drink alcohol or use drugs. Allergies: No Known Allergies Medications Prior to Admission  Medication Sig Dispense Refill  . cyclobenzaprine (FLEXERIL) 10 MG tablet Take 10 mg by mouth every 8 (eight) hours.    Marland Kitchen HYDROcodone-acetaminophen (NORCO/VICODIN) 5-325 MG tablet Take 1 tablet by mouth every 6 (six) hours.    Marland Kitchen ibuprofen (ADVIL,MOTRIN) 800 MG tablet Take 800 mg by mouth 3 (three) times daily as needed for moderate pain.     . megestrol (MEGACE) 40 MG tablet Take 1 tablet (40 mg total) by mouth 2 (two) times daily. Can increase to two tablets twice a day in the event of heavy bleeding (Patient taking differently: Take 40 mg by mouth daily. ) 60 tablet 3  . naproxen (NAPROSYN) 500 MG tablet Take 1 tablet (500 mg total) by mouth 2 (two) times daily. 20 tablet 0  . cyclobenzaprine (FLEXERIL) 5 MG tablet TAKE 1 TABLET BY MOUTH THREE TIMES A DAY AS NEEDED FOR MUSCLE SPASMS (Patient not taking: No sig reported) 30 tablet 0  . ibuprofen (ADVIL,MOTRIN) 600 MG tablet Take 1 tablet (600 mg total) by mouth every 6 (six) hours as needed. (Patient not taking: Reported on 01/17/2019) 30 tablet 0  . methylPREDNISolone (MEDROL DOSEPAK) 4 MG TBPK tablet As directed (Patient not taking: Reported on 01/17/2019) 21 tablet 0    Drug Regimen Review  Drug regimen was reviewed and remains appropriate with no significant issues identified  Home: Home Living Family/patient expects to be discharged to:: Private residence Living Arrangements: Other relatives, Children(sister and 46 y/o daughter) Available Help at Discharge:  Family Type of Home: Apartment Home Access: Stairs to enter Technical brewer of Steps: 14 Entrance Stairs-Rails: Right, Left(split railing) Home Layout: One level Bathroom Shower/Tub: Chiropodist: Standard Home Equipment: None Additional Comments: pt lives with sister and 42 yo daughter   Functional History: Prior Function Level of Independence: Independent Comments: pt is a Database administrator for [employer]  Functional Status:  Mobility: Bed Mobility Overal bed mobility: Needs Assistance Bed Mobility: Rolling Rolling: Max assist, +2 for physical assistance Supine to sit: Total assist, +2 for physical assistance General bed mobility comments: max +3 assist to roll to right with rail, cues and physical assist to lift and move LLE as well as pad to rotate pelvis. Pt with painful LLE and required mod assist to maintain. Max +2 to roll to left with cues and assist to bend and move RLE. Placed sling with rolling and able to use maxisky to transfer from regular bed to bari total lift bed Transfers Transfer via Lift Equipment: [place] transfer comment: +3 assist for lift from bed to vital go tilt bed, use of vital go tilt bed with tilting features  this session  Ambulation/Gait General Gait Details: unable    ADL: ADL Overall ADL's : Needs assistance/impaired Eating/Feeding: Set up, Sitting Eating/Feeding Details (indicate cue type and reason): supported sitting at bed level Grooming: Set up, Bed level, Wash/dry hands, Wash/dry face, Sitting Grooming Details (indicate cue type and reason): sitting in recliner at end of session Upper Body Bathing: Bed level, Moderate assistance Lower Body Bathing: Total assistance, Bed level, +2 for physical assistance, +2 for safety/equipment Upper Body Dressing : Moderate assistance Lower Body Dressing: Total assistance, +2 for physical assistance, +2 for safety/equipment Toilet Transfer: Total assistance Toilet Transfer  Details (indicate cue type and reason): maxi sky used Toileting- Clothing Manipulation and Hygiene: Total assistance Toileting - Clothing Manipulation Details (indicate cue type and reason): currently with flexi seal General ADL Comments: assisted with transition  to vital go tild bed and completing initial tilt, pt tolerating well  Cognition: Cognition Overall Cognitive Status: Within Functional Limits for tasks assessed Orientation Level: Oriented X4 Cognition Arousal/Alertness: Awake/alert Behavior During Therapy: WFL for tasks assessed/performed Overall Cognitive Status: Within Functional Limits for tasks assessed  Physical Exam: Blood pressure 124/90, pulse (!) 102, temperature (!) 97.5 F (36.4 C), temperature source Oral, resp. rate 16, height 5\' 6"  (1.676 m), weight (!) 182 kg, SpO2 100 %. Physical Exam  Constitutional: No distress.  HENT:  Head: Normocephalic.  Eyes: Pupils are equal, round, and reactive to light.  Neck: Normal range of motion. No tracheal deviation present. No thyromegaly present.  Cardiovascular: Normal rate.  Respiratory: Effort normal.  GI: Soft. She exhibits no distension.  Neurological: She is alert.  Patient is alert sitting up in bed. Mood is a bit flat but appropriate. She is oriented to person place and time. Follows full commands. UE 5/5. RLE tr HF, tr-1/5 KE, 1 to 1+/5 ADF/PF. LLE: 0/5 prox to distal. Decreased sensation to light touch/pain up to waist.   Skin: Skin is warm.  Psychiatric: She has a normal mood and affect. Her behavior is normal. Judgment and thought content normal.    Results for orders placed or performed during the hospital encounter of 01/17/19 (from the past 48 hour(s))  Culture, Urine     Status: Abnormal (Preliminary result)   Collection Time: 01/27/19 10:00 AM  Result Value Ref Range   Specimen Description URINE, RANDOM    Special Requests      NONE Performed at [facility], [address], [place],  [place] [address]    Culture (A)     >=100,000 COLONIES/mL PROTEUS MIRABILIS 50,000 COLONIES/mL ESCHERICHIA COLI    Report Status PENDING   Urinalysis, Routine w reflex microscopic     Status: Abnormal   Collection Time: 01/27/19 10:12 AM  Result Value Ref Range   Color, Urine AMBER (A) YELLOW    Comment: BIOCHEMICALS MAY BE AFFECTED BY COLOR   APPearance TURBID (A) CLEAR   Specific Gravity, Urine 1.033 (H) 1.005 - 1.030   pH 7.0 5.0 - 8.0   Glucose, UA NEGATIVE NEGATIVE mg/dL   Hgb urine dipstick LARGE (A) NEGATIVE   Bilirubin Urine NEGATIVE NEGATIVE   Ketones, ur NEGATIVE NEGATIVE mg/dL   Protein, ur 100 (A) NEGATIVE mg/dL   Nitrite NEGATIVE NEGATIVE   Leukocytes, UA LARGE (A) NEGATIVE   RBC / HPF >50 (H) 0 - 5 RBC/hpf   WBC, UA >50 (H) 0 - 5 WBC/hpf   Bacteria, UA MANY (A) NONE SEEN   WBC Clumps PRESENT    Mucus PRESENT  Comment: Performed at [facility], [place] [address] [place], [place] [address]  CBC     Status: Abnormal   Collection Time: 01/27/19 11:50 AM  Result Value Ref Range   WBC 20.5 (H) 4.0 - 10.5 K/uL   RBC 4.69 3.87 - 5.11 MIL/uL   Hemoglobin 11.1 (L) 12.0 - 15.0 g/dL   HCT 35.1 (L) 36.0 - 46.0 %   MCV 74.8 (L) 80.0 - 100.0 fL   MCH 23.7 (L) 26.0 - 34.0 pg   MCHC 31.6 30.0 - 36.0 g/dL   RDW 16.7 (H) 11.5 - 15.5 %   Platelets 180 150 - 400 K/uL   nRBC 0.0 0.0 - 0.2 %    Comment: Performed at [facility], [place] [address], [place], [place] [address]  CBC with Differential/Platelet     Status: Abnormal   Collection Time: 01/28/19 12:10 PM  Result Value Ref Range   WBC 17.7 (H) 4.0 - 10.5 K/uL   RBC 4.80 3.87 - 5.11 MIL/uL   Hemoglobin 11.0 (L) 12.0 - 15.0 g/dL   HCT 36.7 36.0 - 46.0 %   MCV 76.5 (L) 80.0 - 100.0 fL   MCH 22.9 (L) 26.0 - 34.0 pg   MCHC 30.0 30.0 - 36.0 g/dL   RDW 16.6 (H) 11.5 - 15.5 %   Platelets 182 150 - 400 K/uL   nRBC 0.0 0.0 - 0.2 %   Neutrophils Relative % 74 %   Neutro Abs 13.0 (H) 1.7 - 7.7 K/uL   Lymphocytes  Relative 15 %   Lymphs Abs 2.6 0.7 - 4.0 K/uL   Monocytes Relative 9 %   Monocytes Absolute 1.6 (H) 0.1 - 1.0 K/uL   Eosinophils Relative 0 %   Eosinophils Absolute 0.0 0.0 - 0.5 K/uL   Basophils Relative 0 %   Basophils Absolute 0.0 0.0 - 0.1 K/uL   Immature Granulocytes 2 %   Abs Immature Granulocytes 0.43 (H) 0.00 - 0.07 K/uL    Comment: Performed at [facility] [address] [place], [place] [address]   No results found.     Medical Problem List and Plan: 1.  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 No back brace required  -admit to inpatient rehab 2.  DVT Prophylaxis/Anticoagulation: SCDs. Check vascular study 3. Pain Management: Neurontin 300 mg 3 times a day, Flexeril 10 mg every 8 hours and when necessary, hydrocodone as needed 4. Mood:  Provide emotional support 5. Neuropsych: This patient is capable of making decisions on her own behalf. 6. Skin/Wound Care:  Routine skin checks 7. Fluids/Electrolytes/Nutrition:  Routine ins and outs with follow-up chemistries 8. Proteus/Escherichia coliUTI. Bactrim 01/27/2019 with sensitivities pending 9. Morbid obesity. Latest BMI 64.79. Dietary follow-up 10. Neurogenic bowel and bladder. Establish bowel program. Check PVR. Provided education.  Post Admission Physician Evaluation: 1. Functional deficits secondary  to thoracic myelopathy. 2. Patient is admitted to receive collaborative, interdisciplinary care between the physiatrist, rehab nursing staff, and therapy team. 3. Patient's level of medical complexity and substantial therapy needs in context of that medical necessity cannot be provided at a lesser intensity of care such as a SNF. 4. Patient has experienced substantial functional loss from his/her baseline which was documented above under the "Functional History" and "Functional Status" headings.  Judging by the  patient's diagnosis, physical exam, and functional history, the patient has potential for functional progress which will result in measurable gains  while on inpatient rehab.  These gains will be of substantial and practical use upon discharge  in facilitating mobility and self-care at the household level. 5. Physiatrist will provide 24 hour management of medical needs as well as oversight of the therapy plan/treatment and provide guidance as appropriate regarding the interaction of the two. 6. The Preadmission Screening has been reviewed and patient status is unchanged unless otherwise stated above. 7. 24 hour rehab nursing will assist with bladder management, bowel management, safety, skin/wound care, disease management, medication administration, pain management and patient education  and help integrate therapy concepts, techniques,education, etc. 8. PT will assess and treat for/with: Lower extremity strength, range of motion, stamina, balance, functional mobility, safety, adaptive techniques and equipment, NMR, w/c assessment, family ed.   Goals are: min assist. 9. OT will assess and treat for/with: ADL's, functional mobility, safety, upper extremity strength, adaptive techniques and equipment, NMR, w/c use, pain control, family ed.   Goals are: min assist. Therapy may proceed with showering this patient. 10. SLP will assess and treat for/with: n/a.  Goals are: n/a. 11. Case Management and Social Worker will assess and treat for psychological issues and discharge planning. 12. Team conference will be held weekly to assess progress toward goals and to determine barriers to discharge. 13. Patient will receive at least 3 hours of therapy per day at least 5 days per week. 14. ELOS: 22-27 days       15. Prognosis:  excellent   I have personally performed a face to face diagnostic evaluation of this patient and formulated the key components of the plan.  Additionally, I have personally reviewed laboratory  data, imaging studies, as well as relevant notes and concur with the physician assistant's documentation above.  [name], MD, FAAPMR    Lavon Paganini [place], PA-C 01/29/2019

## 2019-01-28 NOTE — Progress Notes (Signed)
Inpatient Rehabilitation Admissions Coordinator  I have begun insurance authorization with [employer] for a possible inpt rehab admit.  Danne Baxter, RN, MSN Rehab Admissions Coordinator [phone] 01/28/2019 8:57 AM

## 2019-01-28 NOTE — Progress Notes (Signed)
   Providing Compassionate, Quality Care - Together    Nursing staff reports foley catheter removed yesterday at noon. Patient was started on Bactrim yesterday based on UA and CBC. Lab concerned for contamination of blood sample, but phlebotomy unable to redraw yesterday.  Vital signs in last 24 hours: Temp:  [97.5 F (36.4 C)-98.4 F (36.9 C)] 98.4 F (36.9 C) (02/06 0833) Pulse Rate:  [88-98] 90 (02/06 0833) Resp:  [14-16] 16 (02/06 0303) BP: (109-156)/(59-87) 129/81 (02/06 0833) SpO2:  [99 %-100 %] 100 % (02/06 0833)  Intake/Output from previous day: 02/05 0701 - 02/06 0700 In: 360 [P.O.:360] Out: 1005 [Urine:1005] Intake/Output this shift: No intake/output data recorded.   Lab Results: Recent Labs    01/27/19 1150  WBC 20.5*  HGB 11.1*  HCT 35.1*  PLT 180    Assessment/Plan: Ellen Hill is 7 days status post T9, T10, T11, T12, and partial L1 decompressive laminectomies. Weakness and dysthesias slowly improving since surgery. Foley previously maintained due to bladder dysfunction. It was removed 01/27/2019 due to UA and culture results. Therapies are working with Ellen Hill with CIR recommendation.   LOS: 11 days    -Redraw CBC to ensure WBC trending down -Continue treatment with Bactrim; adjustment will be made if indicated with sensitivity -Bladder scan patient following void to ensure patient is fully emptying her bladder  Viona Gilmore, DNP, AGNP-C Nurse Practitioner  [facility]. [address], [address], [place], [place] [address] P: [phone]    F: [phone]  01/28/2019, 11:30 AM

## 2019-01-28 NOTE — Progress Notes (Signed)
Inpatient Rehabilitation Admissions Coordinator  I contacted Meghan NP for Dr. Annette Stable to request follow up on elevated WBC issues from 2/5 with plan of care outlined. She will discuss with Dr. Annette Stable and follow up.  Danne Baxter, RN, MSN Rehab Admissions Coordinator [phone] 01/28/2019 10:56 AM

## 2019-01-28 NOTE — Progress Notes (Signed)
Physical Therapy Treatment Patient Details Name: Ellen Hill MRN: [id number] DOB: [birth year] Today's Date: 01/28/2019    History of Present Illness 48 yo morbidly obese female presented to ER with gait instability and bil LE weakness R>L and LBP after fall. Pt with thoracic stenosis with cord compression s/p T9-L1 laminectomies 1/28. PMHx: asthma, menorrhagia, obesity    PT Comments    Pt pleasant and reports feeling discouraged with flexiseal and generally not feeling as well. Pt with bari total lift bed present and pt lifted from her bed to total lift bed. Pt then strapped and tilted. Pt tolerated tilt to 45degrees with noted bil quad contractions but left knee buckled with increased tilt with internal hip rotation and knee flexion with pt returned to supine, straps repositioned and bil knees supported by therapists with 2nd tilt performed to 30 degrees with repeated quad sets in partial tilt and then briefly tilted to 45 again with left foot sliding and pt returned to supine for safety. Pt positioned in semi chair position in bed with pillows in place. Pt with 2-/5 quad and hamstring activation of LLE with upright positioning and tilt today within very limited ROM grossly 5 degrees of knee ROM. Pt tolerated 30 degree tilt approximately 10 min total.  Will plan to vary sessions between lift to chair for increased sitting tolerance and tilting for maximal weight bearing.  BP supine 128/85 131/108 at 30 degree tilt 111/62 at 45 degrees   Follow Up Recommendations  CIR;Supervision/Assistance - 24 hour     Equipment Recommendations  Wheelchair (measurements PT);Wheelchair cushion (measurements PT);Hospital bed;Other (comment)    Recommendations for Other Services       Precautions / Restrictions Precautions Precautions: Back;Fall Precaution Comments: obese    Mobility  Bed Mobility Overal bed mobility: Needs Assistance Bed Mobility: Rolling Rolling: Max assist;+2 for physical  assistance         General bed mobility comments: max +3 assist to roll to right with rail, cues and physical assist to lift and move LLE as well as pad to rotate pelvis. Pt with painful LLE and required mod assist to maintain. Max +2 to roll to left with cues and assist to bend and move RLE. Placed sling with rolling and able to use maxisky to transfer from regular bed to bari total lift bed  Transfers                    Ambulation/Gait                 Stairs             Wheelchair Mobility    Modified Rankin (Stroke Patients Only)       Balance                                            Cognition Arousal/Alertness: Awake/alert Behavior During Therapy: WFL for tasks assessed/performed Overall Cognitive Status: Within Functional Limits for tasks assessed                                        Exercises General Exercises - Lower Extremity Quad Sets: AROM;10 reps;Standing(tilt 30 degrees x 2 trials)    General Comments        Pertinent Vitals/Pain Pain Score:  5  Pain Location: back, LLE with movement Pain Descriptors / Indicators: Aching;Discomfort Pain Intervention(s): Limited activity within patient's tolerance;Premedicated before session;Monitored during session;Repositioned    Home Living                      Prior Function            PT Goals (current goals can now be found in the care plan section) Progress towards PT goals: Progressing toward goals    Frequency    Min 4X/week      PT Plan Current plan remains appropriate    Co-evaluation PT/OT/SLP Co-Evaluation/Treatment: Yes Reason for Co-Treatment: Complexity of the patient's impairments (multi-system involvement);For patient/therapist safety PT goals addressed during session: Mobility/safety with mobility;Strengthening/ROM        AM-PAC PT "6 Clicks" Mobility   Outcome Measure  Help needed turning from your back to your  side while in a flat bed without using bedrails?: A Lot Help needed moving from lying on your back to sitting on the side of a flat bed without using bedrails?: Total Help needed moving to and from a bed to a chair (including a wheelchair)?: Total Help needed standing up from a chair using your arms (e.g., wheelchair or bedside chair)?: Total Help needed to walk in hospital room?: Total Help needed climbing 3-5 steps with a railing? : Total 6 Click Score: 7    End of Session   Activity Tolerance: Patient tolerated treatment well Patient left: in bed;with call bell/phone within reach Nurse Communication: Mobility status;Need for lift equipment;Other (comment)(total lift bed with instruction book given to RN) PT Visit Diagnosis: Other abnormalities of gait and mobility (R26.89);Muscle weakness (generalized) (M62.81);Other symptoms and signs involving the nervous system (R29.898)     Time: 9450-3888 PT Time Calculation (min) (ACUTE ONLY): 66 min  Charges:  $Therapeutic Activity: 23-37 mins                     Jenkins, PT Acute Rehabilitation Services Pager: [phone] Office: [place] 01/28/2019, 12:15 PM

## 2019-01-29 ENCOUNTER — Other Ambulatory Visit: Payer: Self-pay

## 2019-01-29 ENCOUNTER — Inpatient Hospital Stay (HOSPITAL_COMMUNITY)
Admission: RE | Admit: 2019-01-29 | Discharge: 2019-03-05 | DRG: 560 | Disposition: A | Discharge status: Skilled Nursing Facility | Payer: BC Managed Care – PPO | Source: Intra-hospital | Attending: Physical Medicine & Rehabilitation | Admitting: Physical Medicine & Rehabilitation

## 2019-01-29 DIAGNOSIS — J45909 Unspecified asthma, uncomplicated: Secondary | ICD-10-CM | POA: Diagnosis present

## 2019-01-29 DIAGNOSIS — N319 Neuromuscular dysfunction of bladder, unspecified: Secondary | ICD-10-CM | POA: Diagnosis present

## 2019-01-29 DIAGNOSIS — F4321 Adjustment disorder with depressed mood: Secondary | ICD-10-CM | POA: Diagnosis present

## 2019-01-29 DIAGNOSIS — Z6841 Body Mass Index (BMI) 40.0 and over, adult: Secondary | ICD-10-CM | POA: Diagnosis not present

## 2019-01-29 DIAGNOSIS — R300 Dysuria: Secondary | ICD-10-CM

## 2019-01-29 DIAGNOSIS — Z79899 Other long term (current) drug therapy: Secondary | ICD-10-CM

## 2019-01-29 DIAGNOSIS — K592 Neurogenic bowel, not elsewhere classified: Secondary | ICD-10-CM | POA: Diagnosis present

## 2019-01-29 DIAGNOSIS — G8220 Paraplegia, unspecified: Secondary | ICD-10-CM | POA: Diagnosis present

## 2019-01-29 DIAGNOSIS — Z87891 Personal history of nicotine dependence: Secondary | ICD-10-CM

## 2019-01-29 DIAGNOSIS — R159 Full incontinence of feces: Secondary | ICD-10-CM | POA: Diagnosis not present

## 2019-01-29 DIAGNOSIS — D72829 Elevated white blood cell count, unspecified: Secondary | ICD-10-CM | POA: Diagnosis not present

## 2019-01-29 DIAGNOSIS — F419 Anxiety disorder, unspecified: Secondary | ICD-10-CM | POA: Diagnosis present

## 2019-01-29 DIAGNOSIS — Z4789 Encounter for other orthopedic aftercare: Principal | ICD-10-CM

## 2019-01-29 DIAGNOSIS — D638 Anemia in other chronic diseases classified elsewhere: Secondary | ICD-10-CM

## 2019-01-29 DIAGNOSIS — M7989 Other specified soft tissue disorders: Secondary | ICD-10-CM | POA: Diagnosis not present

## 2019-01-29 DIAGNOSIS — R609 Edema, unspecified: Secondary | ICD-10-CM | POA: Diagnosis not present

## 2019-01-29 DIAGNOSIS — B9620 Unspecified Escherichia coli [E. coli] as the cause of diseases classified elsewhere: Secondary | ICD-10-CM | POA: Diagnosis present

## 2019-01-29 DIAGNOSIS — N390 Urinary tract infection, site not specified: Secondary | ICD-10-CM | POA: Diagnosis present

## 2019-01-29 DIAGNOSIS — G4700 Insomnia, unspecified: Secondary | ICD-10-CM | POA: Diagnosis present

## 2019-01-29 DIAGNOSIS — B964 Proteus (mirabilis) (morganii) as the cause of diseases classified elsewhere: Secondary | ICD-10-CM | POA: Diagnosis present

## 2019-01-29 DIAGNOSIS — E669 Obesity, unspecified: Secondary | ICD-10-CM

## 2019-01-29 DIAGNOSIS — G959 Disease of spinal cord, unspecified: Secondary | ICD-10-CM

## 2019-01-29 DIAGNOSIS — K5900 Constipation, unspecified: Secondary | ICD-10-CM | POA: Diagnosis present

## 2019-01-29 MED ORDER — OXYCODONE HCL 5 MG PO TABS: 5.0000 mg | ORAL_TABLET | Freq: Four times a day (QID) | ORAL | 0 refills | Status: DC | PRN

## 2019-01-29 MED ORDER — ACETAMINOPHEN 325 MG PO TABS
650.0000 mg | ORAL_TABLET | ORAL | Status: DC | PRN
  Administered 2019-02-01: 650 mg via ORAL
  Filled 2019-01-29 (×2): qty 2

## 2019-01-29 MED ORDER — DEXAMETHASONE 2 MG PO TABS
2.0000 mg | ORAL_TABLET | Freq: Two times a day (BID) | ORAL | Status: DC
  Administered 2019-01-29 – 2019-02-01 (×6): 2 mg via ORAL
  Filled 2019-01-29 (×7): qty 1

## 2019-01-29 MED ORDER — SULFAMETHOXAZOLE-TRIMETHOPRIM 800-160 MG PO TABS
1.0000 | ORAL_TABLET | Freq: Two times a day (BID) | ORAL | Status: AC
  Administered 2019-01-29 – 2019-02-03 (×11): 1 via ORAL
  Filled 2019-01-29 (×11): qty 1

## 2019-01-29 MED ORDER — SENNOSIDES-DOCUSATE SODIUM 8.6-50 MG PO TABS
2.0000 | ORAL_TABLET | Freq: Every day | ORAL | Status: DC
  Administered 2019-01-30 – 2019-03-04 (×30): 2 via ORAL
  Filled 2019-01-29 (×34): qty 2

## 2019-01-29 MED ORDER — ONDANSETRON HCL 4 MG PO TABS: 4.0000 mg | ORAL_TABLET | Freq: Four times a day (QID) | ORAL | Status: DC | PRN

## 2019-01-29 MED ORDER — CYCLOBENZAPRINE HCL 10 MG PO TABS: 10.0000 mg | ORAL_TABLET | Freq: Three times a day (TID) | ORAL | Status: DC | PRN

## 2019-01-29 MED ORDER — SORBITOL 70 % SOLN
30.0000 mL | Freq: Every day | Status: DC | PRN
  Administered 2019-02-12 – 2019-02-16 (×3): 30 mL via ORAL
  Filled 2019-01-29 (×5): qty 30

## 2019-01-29 MED ORDER — BISACODYL 5 MG PO TBEC: 5.0000 mg | DELAYED_RELEASE_TABLET | Freq: Every day | ORAL | Status: DC | PRN

## 2019-01-29 MED ORDER — DEXAMETHASONE SODIUM PHOSPHATE 4 MG/ML IJ SOLN
4.0000 mg | Freq: Two times a day (BID) | INTRAMUSCULAR | Status: DC
  Filled 2019-01-29 (×2): qty 1

## 2019-01-29 MED ORDER — ACETAMINOPHEN 650 MG RE SUPP: 650.0000 mg | RECTAL | Status: DC | PRN

## 2019-01-29 MED ORDER — BISACODYL 10 MG RE SUPP
10.0000 mg | Freq: Every day | RECTAL | Status: DC
  Administered 2019-01-30 – 2019-02-06 (×6): 10 mg via RECTAL
  Filled 2019-01-29 (×9): qty 1

## 2019-01-29 MED ORDER — ONDANSETRON HCL 4 MG/2ML IJ SOLN: 4.0000 mg | Freq: Four times a day (QID) | INTRAMUSCULAR | Status: DC | PRN

## 2019-01-29 MED ORDER — HYDROCODONE-ACETAMINOPHEN 5-325 MG PO TABS: 1.0000 | ORAL_TABLET | ORAL | 0 refills | Status: DC | PRN

## 2019-01-29 MED ORDER — GABAPENTIN 300 MG PO CAPS: 300.0000 mg | ORAL_CAPSULE | Freq: Three times a day (TID) | ORAL | 1 refills | Status: DC

## 2019-01-29 MED ORDER — ALUM & MAG HYDROXIDE-SIMETH 200-200-20 MG/5ML PO SUSP
30.0000 mL | Freq: Four times a day (QID) | ORAL | Status: DC | PRN
  Administered 2019-02-19: 30 mL via ORAL
  Filled 2019-01-29: qty 30

## 2019-01-29 MED ORDER — MEGESTROL ACETATE 40 MG PO TABS
40.0000 mg | ORAL_TABLET | Freq: Two times a day (BID) | ORAL | Status: DC
  Administered 2019-01-29 – 2019-02-08 (×20): 40 mg via ORAL
  Filled 2019-01-29 (×20): qty 1

## 2019-01-29 MED ORDER — GABAPENTIN 300 MG PO CAPS
300.0000 mg | ORAL_CAPSULE | Freq: Three times a day (TID) | ORAL | Status: DC
  Administered 2019-01-29 – 2019-02-07 (×29): 300 mg via ORAL
  Filled 2019-01-29 (×28): qty 1

## 2019-01-29 MED ORDER — HYDROCODONE-ACETAMINOPHEN 5-325 MG PO TABS
1.0000 | ORAL_TABLET | ORAL | Status: DC | PRN
  Administered 2019-01-29 – 2019-02-01 (×7): 1 via ORAL
  Administered 2019-02-01 – 2019-02-03 (×7): 2 via ORAL
  Administered 2019-02-03: 1 via ORAL
  Administered 2019-02-04 – 2019-02-09 (×13): 2 via ORAL
  Administered 2019-02-09: 1 via ORAL
  Administered 2019-02-10 – 2019-02-12 (×5): 2 via ORAL
  Administered 2019-02-12: 1 via ORAL
  Administered 2019-02-13 – 2019-02-14 (×4): 2 via ORAL
  Administered 2019-02-14: 1 via ORAL
  Administered 2019-02-15: 2 via ORAL
  Administered 2019-02-15: 1 via ORAL
  Administered 2019-02-15 – 2019-02-19 (×13): 2 via ORAL
  Administered 2019-02-20 (×2): 1 via ORAL
  Administered 2019-02-20 – 2019-02-21 (×2): 2 via ORAL
  Administered 2019-02-21: 1 via ORAL
  Administered 2019-02-21 – 2019-02-24 (×10): 2 via ORAL
  Administered 2019-02-25: 1 via ORAL
  Administered 2019-02-25 (×2): 2 via ORAL
  Administered 2019-02-25: 1 via ORAL
  Administered 2019-02-26 (×4): 2 via ORAL
  Administered 2019-02-27: 1 via ORAL
  Administered 2019-02-27 – 2019-02-28 (×3): 2 via ORAL
  Administered 2019-03-01: 1 via ORAL
  Administered 2019-03-01 – 2019-03-02 (×4): 2 via ORAL
  Administered 2019-03-02: 1 via ORAL
  Administered 2019-03-03 – 2019-03-05 (×9): 2 via ORAL
  Filled 2019-01-29: qty 1
  Filled 2019-01-29 (×10): qty 2
  Filled 2019-01-29: qty 1
  Filled 2019-01-29: qty 2
  Filled 2019-01-29: qty 1
  Filled 2019-01-29: qty 2
  Filled 2019-01-29 (×2): qty 1
  Filled 2019-01-29 (×9): qty 2
  Filled 2019-01-29 (×2): qty 1
  Filled 2019-01-29 (×6): qty 2
  Filled 2019-01-29: qty 1
  Filled 2019-01-29 (×2): qty 2
  Filled 2019-01-29: qty 1
  Filled 2019-01-29 (×4): qty 2
  Filled 2019-01-29 (×2): qty 1
  Filled 2019-01-29 (×11): qty 2
  Filled 2019-01-29: qty 1
  Filled 2019-01-29 (×11): qty 2
  Filled 2019-01-29: qty 1
  Filled 2019-01-29 (×4): qty 2
  Filled 2019-01-29: qty 1
  Filled 2019-01-29 (×8): qty 2
  Filled 2019-01-29: qty 1
  Filled 2019-01-29 (×7): qty 2
  Filled 2019-01-29: qty 1
  Filled 2019-01-29 (×2): qty 2
  Filled 2019-01-29: qty 1
  Filled 2019-01-29 (×5): qty 2
  Filled 2019-01-29: qty 1
  Filled 2019-01-29: qty 2

## 2019-01-29 MED ORDER — SULFAMETHOXAZOLE-TRIMETHOPRIM 800-160 MG PO TABS: 1.0000 | ORAL_TABLET | Freq: Two times a day (BID) | ORAL | 0 refills | Status: DC

## 2019-01-29 NOTE — Progress Notes (Signed)
Physical Therapy Treatment Patient Details Name: Ellen Hill MRN: [id number] DOB: 02-20-1971 Today's Date: 01/29/2019    History of Present Illness 48 yo morbidly obese female presented to ER with gait instability and bil LE weakness R>L and LBP after fall. Pt with thoracic stenosis with cord compression s/p T9-L1 laminectomies 1/28. PMHx: asthma, menorrhagia, obesity    PT Comments    Pt very pleasant and willing to mobilize stating she is still getting used to the tilt bed. Pt able to tolerate tilt to 45 degrees x 10 min today without buckling but with some hyperextension of LLE in increased weight bearing with decreased activation of left quad and hamstring today. Pt performed standing quad sets on right and able to lift OOB to chair with feet supported in upright position and education for pressure relief and no more than 2 hrs in chair and RN aware as well. Pt continues to be highly motivated and moving well with using bil UE to bring trunk forward in chair.    Follow Up Recommendations  CIR;Supervision/Assistance - 24 hour     Equipment Recommendations  Wheelchair (measurements PT);Wheelchair cushion (measurements PT);Hospital bed;Other (comment)    Recommendations for Other Services       Precautions / Restrictions Precautions Precautions: Back;Fall Precaution Comments: obese    Mobility  Bed Mobility Overal bed mobility: Needs Assistance Bed Mobility: Rolling Rolling: Mod assist;+2 for physical assistance         General bed mobility comments: pt able to roll to right with assist to move LLE and slight assist to roll but able to roll with 1 person assist. to left mod +2 with assist to move RLE and pt using rail to assist with rolling for lift pad placement  Transfers Overall transfer level: Needs assistance   Transfers: Sit to/from Stand Sit to Stand: Total assist         General transfer comment: utilized lift bed for tilting to 45 degrees for 10 min  with bil knees and feet monitored for sliding and buckling with pt able to perform right knee movement quad contraction in standing. Maxisky from bed to chair after tilt  Ambulation/Gait             General Gait Details: unable   Stairs             Wheelchair Mobility    Modified Rankin (Stroke Patients Only)       Balance Overall balance assessment: Needs assistance Sitting-balance support: Bilateral upper extremity supported   Sitting balance - Comments: in chair with feet supported on floor pt min assist to use bil UE and lean forward, back support/unsupported with no LOB with bil UE support                                    Cognition Arousal/Alertness: Awake/alert Behavior During Therapy: WFL for tasks assessed/performed Overall Cognitive Status: Within Functional Limits for tasks assessed                                        Exercises General Exercises - Lower Extremity Quad Sets: AROM;10 reps;Standing;Right;Left(tilt 30 degrees x 1 trial left and 2 trials right. very limited contraction on left today)    General Comments        Pertinent Vitals/Pain Pain Assessment: No/denies pain  Home Living   Living Arrangements: (lives with sister and her 56 year old daughter) Available Help at Discharge: Family;Friend(s);Available 24 hours/day(pt states friends and family can arrange 24/7 assist at d/c)     Entrance Stairs-Rails: Right;Left          Prior Function        Comments: decline in function over 2 1/2 months   PT Goals (current goals can now be found in the care plan section) Progress towards PT goals: Progressing toward goals    Frequency           PT Plan Current plan remains appropriate    Co-evaluation              AM-PAC PT "6 Clicks" Mobility   Outcome Measure  Help needed turning from your back to your side while in a flat bed without using bedrails?: A Lot Help needed moving  from lying on your back to sitting on the side of a flat bed without using bedrails?: Total Help needed moving to and from a bed to a chair (including a wheelchair)?: Total Help needed standing up from a chair using your arms (e.g., wheelchair or bedside chair)?: Total Help needed to walk in hospital room?: Total Help needed climbing 3-5 steps with a railing? : Total 6 Click Score: 7    End of Session   Activity Tolerance: Patient tolerated treatment well Patient left: in chair;with call bell/phone within reach Nurse Communication: Mobility status;Need for lift equipment;Other (comment) PT Visit Diagnosis: Other abnormalities of gait and mobility (R26.89);Muscle weakness (generalized) (M62.81);Other symptoms and signs involving the nervous system (R29.898)     Time: 2248-2500 PT Time Calculation (min) (ACUTE ONLY): 40 min  Charges:  $Therapeutic Exercise: 8-22 mins $Therapeutic Activity: 23-37 mins                     Hickman, PT Acute Rehabilitation Services Pager: [phone] Office: [place] 01/29/2019, 10:54 AM

## 2019-01-29 NOTE — Plan of Care (Signed)

## 2019-01-29 NOTE — Progress Notes (Signed)
Patient ID: Ellen Hill, female   DOB: 1971/10/25, 48 y.o.   MRN: [id number]   Pt admitted to room 4W14. Oriented to floor, room number, call bell, and rehab's no fall policy. Pt denies any pain at this time.  Continue plan of care.   Quana Chamberlain W Abigayle Wilinski

## 2019-01-29 NOTE — Progress Notes (Addendum)
Inpatient Rehabilitation Admissions Coordinator  I have insurance approval to admit pt to inpt rehab today. I met with pt at bedside and she is in agreement. I will contact Attending service to arrange d/c today. I will alert RN CM and SW.  Danne Baxter, RN, MSN Rehab Admissions Coordinator [phone] 01/29/2019 8:51 AM   I contacted Meghan NP for Dr. Annette Stable. We will arrange d/c to CIR today.  Danne Baxter, RN, MSN Rehab Admissions Coordinator [phone] 01/29/2019 9:07 AM

## 2019-01-29 NOTE — Progress Notes (Signed)
Ellen Gong, RN  Rehab Admission Coordinator  Physical Medicine and Rehabilitation  PMR Pre-admission  Signed  Date of Service:  01/29/2019 9:53 AM       Related encounter: ED to Hosp-Admission (Current) from 01/17/2019 in [facility]         Show:Clear all [x] Manual[x] Template[x] Copied  Added by: [x] Ellen Gong, RN  [] Hover for details PMR Admission Coordinator Pre-Admission Assessment  Patient: Ellen Hill is an 48 y.o., female MRN: [id number] DOB: 1971-06-05 Height: 5\' 6"  (167.6 cm) Weight: (!) 182 kg                                                                                                                                                  Insurance Information HMO:     PPO: yes     PCP:      IPA:      80/20:      OTHER:  PRIMARY: [place] [employer] of [place]      Policy#: [id number]      Subscriber: pt CM Name: Ellen Hill      Phone#: [phone]     Fax#: [phone] Pre-Cert#: [id number] approved for 7 days       Employer: [employer] Benefits:  Phone #: [phone]     Name: 01/28/2019 Eff. 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  Presented 01/17/2019 with  gait instability bilateral lower extremity weakness 2-1/2 months to the point she was essentially nonambulatory. 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 Patient underwent T9, T10, T11-12 and partial L1 decompressive laminotomies 01/19/2019 per Dr. Annette Stable. No back brace required. Surgical PCR screening positive for contact precautions. Hospital course ongoing pain management. Decadron protocol as  indicated. Elevated WBC 20,500 decreased to 17,21monitored closely while on steroid therapy with urine culture completed 01/27/2019 greater 100,000 Proteus and 50,000 Escherichia coli andcurrently maintained on Bactrim. Bouts of diarrhea noted patient initially did have a rectal tube in place and since has been removed.   Past Medical History      Past Medical History:  Diagnosis Date  . Abnormal Pap smear    history  . Anemia   . ASCUS (atypical squamous cells of undetermined significance) on Pap smear 2008   At 46wk of pregnancy ; colpo  . Asthma    rarely uses inhaler (seasonal)  . 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 Fibroids   . H/O candidiasis   . H/O fatigue 08/2007  . H/O varicella   . Headache(784.0)    migraines   . Heart murmur    dx child - no problems as adult  . Hx: UTI (urinary tract infection) 07/29/07  . Increased BMI   . Irregular bleeding   . Migraines   . Obesity 02/13/12  . Pelvic pain 02/13/12  . Postpartum anemia 08/18/07  . 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 after loss of parents, is fine  . Vaginal Pap smear, abnormal    f/u wnl    Family History  family history includes Breast cancer in her mother; Cancer in her father and mother; Diabetes in her maternal aunt; Heart attack in her father; Hypertension in her father, mother, and sister.  Prior Rehab/Hospitalizations:  Has the  patient had major surgery during 100 days prior to admission? No  Current Medications   Current Facility-Administered Medications:  .  0.9 %  sodium chloride infusion, 250 mL, Intravenous, PRN, Costella, Vincent J, PA-C .  0.9 %  sodium chloride infusion, 250 mL, Intravenous, Continuous, Pool, Henry, MD .  acetaminophen (TYLENOL) tablet 650 mg, 650 mg, Oral, Q4H PRN, 650 mg at 01/26/19 1723 **OR** acetaminophen (TYLENOL) suppository 650 mg, 650 mg, Rectal, Q4H PRN, Pool, Mallie Mussel, MD .  alum & mag hydroxide-simeth (MAALOX/MYLANTA) 200-200-20 MG/5ML suspension 30 mL, 30 mL, Oral, Q6H PRN, Kristeen Miss, MD, 30 mL at 01/27/19 0204 .  bisacodyl (DULCOLAX) EC tablet 5 mg, 5 mg, Oral, Daily PRN, Costella, Vincent J, PA-C, 5 mg at 01/22/19 1800 .  bisacodyl (DULCOLAX) suppository 10 mg, 10 mg, Rectal, Daily PRN, Earnie Larsson, MD .  cyclobenzaprine (FLEXERIL) tablet 10 mg, 10 mg, Oral, Q8H, Costella, Vincent J, PA-C, 10 mg at 01/29/19 9528 .  cyclobenzaprine (FLEXERIL) tablet 10 mg, 10 mg, Oral, TID PRN, Earnie Larsson, MD .  dexamethasone (DECADRON) injection 4 mg, 4 mg, Intravenous, Q12H **OR** dexamethasone (DECADRON) tablet 2 mg, 2 mg, Oral, Q12H, Earnie Larsson, MD, 2 mg at 01/29/19 0923 .  feeding supplement (BOOST / RESOURCE BREEZE) liquid 1 Container, 1 Container, Oral, BID BM, Earnie Larsson, MD, 1 Container at 01/27/19 1050 .  gabapentin (NEURONTIN) capsule 300 mg, 300 mg, Oral, TID, Earnie Larsson, MD, 300 mg at 01/29/19 0923 .  HYDROcodone-acetaminophen (NORCO) 10-325 MG per tablet 2 tablet, 2 tablet, Oral, Q4H PRN, Earnie Larsson, MD, 2 tablet at 01/26/19 0603 .  HYDROcodone-acetaminophen (NORCO/VICODIN) 5-325 MG per tablet 1 tablet, 1 tablet, Oral, Q4H PRN, Earnie Larsson, MD, 1 tablet at 01/28/19 1429 .  HYDROcodone-acetaminophen (NORCO/VICODIN) 5-325 MG per tablet 1-2 tablet, 1-2 tablet, Oral, Q4H PRN, Costella, Vista Mink, PA-C, 2 tablet at 01/28/19 1844 .  megestrol (MEGACE) tablet 40 mg, 40 mg, Oral, BID,  Costella, Vincent J, PA-C, 40 mg at 01/29/19  9675 .  menthol-cetylpyridinium (CEPACOL) lozenge 3 mg, 1 lozenge, Oral, PRN **OR** phenol (CHLORASEPTIC) mouth spray 1 spray, 1 spray, Mouth/Throat, PRN, Pool, Henry, MD .  ondansetron (ZOFRAN) tablet 4 mg, 4 mg, Oral, Q6H PRN **OR** ondansetron (ZOFRAN) injection 4 mg, 4 mg, Intravenous, Q6H PRN, Pool, Mallie Mussel, MD .  oxyCODONE (Oxy IR/ROXICODONE) immediate release tablet 5-10 mg, 5-10 mg, Oral, Q3H PRN, Costella, Vincent J, PA-C, 10 mg at 01/29/19 0923 .  sodium chloride flush (NS) 0.9 % injection 3 mL, 3 mL, Intravenous, Q12H, Costella, Vincent J, PA-C, 3 mL at 01/28/19 2212 .  sodium chloride flush (NS) 0.9 % injection 3 mL, 3 mL, Intravenous, PRN, Costella, Vincent J, PA-C .  sodium chloride flush (NS) 0.9 % injection 3 mL, 3 mL, Intravenous, Q12H, Pool, Mallie Mussel, MD, 3 mL at 01/28/19 2212 .  sodium chloride flush (NS) 0.9 % injection 3 mL, 3 mL, Intravenous, PRN, Earnie Larsson, MD .  sulfamethoxazole-trimethoprim (BACTRIM DS,SEPTRA DS) 800-160 MG per tablet 1 tablet, 1 tablet, Oral, Q12H, Earnie Larsson, MD, 1 tablet at 01/29/19 9163  Patients Current Diet:     Diet Order                  Diet regular Room service appropriate? Yes; Fluid consistency: Thin  Diet effective now               Precautions / Restrictions Precautions Precautions: Back, Fall Precaution Booklet Issued: Yes (comment) Precaution Comments: obese Restrictions Weight Bearing Restrictions: No   Has the patient had 2 or more falls or a fall with injury in the past year?Yes  Prior Activity Level Limited Community (1-2x/wk): was independent; sedentary; worked. Gradual decline in function over last 2 1/2 months  Development worker, international aid / [place] Devices/Equipment: Wheelchair, Environmental consultant (specify type)(front wheel walker) Home Equipment: None  Prior Device Use: Indicate devices/aids used by the patient prior to current illness, exacerbation or  injury? None of the above  Prior Functional Level Prior Function Level of Independence: Independent Comments: decline in function over 2 1/2 months  Self Care: Did the patient need help bathing, dressing, using the toilet or eating?  Independent  Indoor Mobility: Did the patient need assistance with walking from room to room (with or without device)? Independent  Stairs: Did the patient need assistance with internal or external stairs (with or without device)? Independent  Functional Cognition: Did the patient need help planning regular tasks such as shopping or remembering to take medications? Independent  Current Functional Level Cognition  Overall Cognitive Status: Within Functional Limits for tasks assessed Orientation Level: Oriented X4    Extremity Assessment (includes Sensation/Coordination)  Upper Extremity Assessment: Overall WFL for tasks assessed  Lower Extremity Assessment: Defer to PT evaluation RLE Deficits / Details: 2/5 knee and hip flexion, 2-/5 knee extension, 2-/5 dorsiflexion, PROM limited by body habitus RLE Sensation: decreased light touch(pt able to sense light touch mid calf distally and pressure throughout entire leg) RLE Coordination: decreased gross motor LLE Deficits / Details: no AROM, limited ROM due to body habitus    ADLs  Overall ADL's : Needs assistance/impaired Eating/Feeding: Set up, Sitting Eating/Feeding Details (indicate cue type and reason): supported sitting at bed level Grooming: Set up, Bed level, Wash/dry hands, Wash/dry face, Sitting Grooming Details (indicate cue type and reason): sitting in recliner at end of session Upper Body Bathing: Bed level, Moderate assistance Lower Body Bathing: Total assistance, Bed level, +2 for physical assistance, +2 for safety/equipment Upper Body Dressing :  Moderate assistance Lower Body Dressing: Total assistance, +2 for physical assistance, +2 for safety/equipment Toilet Transfer: Total  assistance Toilet Transfer Details (indicate cue type and reason): maxi sky used Toileting- Clothing Manipulation and Hygiene: Total assistance Toileting - Clothing Manipulation Details (indicate cue type and reason): currently with flexi seal General ADL Comments: assisted with transition  to vital go tild bed and completing initial tilt, pt tolerating well    Mobility  Overal bed mobility: Needs Assistance Bed Mobility: Rolling Rolling: Max assist, +2 for physical assistance Supine to sit: Total assist, +2 for physical assistance General bed mobility comments: max +3 assist to roll to right with rail, cues and physical assist to lift and move LLE as well as pad to rotate pelvis. Pt with painful LLE and required mod assist to maintain. Max +2 to roll to left with cues and assist to bend and move RLE. Placed sling with rolling and able to use maxisky to transfer from regular bed to bari total lift bed    Transfers  Transfer via Lift Equipment: [place] transfer comment: +3 assist for lift from bed to vital go tilt bed, use of vital go tilt bed with tilting features this session     Ambulation / Gait / Stairs / Wheelchair Mobility  Ambulation/Gait General Gait Details: unable    Posture / Balance Dynamic Sitting Balance Sitting balance - Comments: in chair with feet supported on floor pt min assist to use bil UE and lean forward, back support/unsupported with no LOB with bil UE support Balance Overall balance assessment: Needs assistance Sitting-balance support: Bilateral upper extremity supported Sitting balance - Comments: in chair with feet supported on floor pt min assist to use bil UE and lean forward, back support/unsupported with no LOB with bil UE support    Special needs/care consideration BiPAP/CPAP n/a CPM n/a Continuous Drip IV n/a Dialysis n/a Life Vest n/a Oxygen n/a Special Bed tilt bariatric bed Trach Size n/a Wound Vac n/a Skin MASD right leg and  bilateral groin area Bowel mgmt: incontinent LBM 2/6. flexiseal unable to be used due to pt size Bladder mgmt: external catheter Diabetic mgmt n/a Maxi sky list use Bariatric equipment   Previous Home Environment Living Arrangements: (lives with sister and her 55 year old daughter)  Lives With: (sister in sister's apartment) Available Help at Discharge: Family, Friend(s), Available 24 hours/day(pt states friends and family can arrange 24/7 assist at d/c) Type of Home: Apartment Home Layout: One level Home Access: Stairs to enter Entrance Stairs-Rails: Right, Left Entrance Stairs-Number of Steps: 14 Bathroom Shower/Tub: Optometrist: Yes How Accessible: Accessible via walker Home Care Services: No Additional Comments: pt lives with sister and 54 yo daughter  Discharge Living Setting Plans for Discharge Living Setting: Lives with (comment)(patient states she has arranged with her Best friend to go t) Type of Home at Discharge: House Discharge Home Layout: One level Discharge Home Access: Level entry Discharge Bathroom Shower/Tub: Tub/shower unit Discharge Bathroom Accessibility: No Does the patient have any problems obtaining your medications?: No  Social/Family/Support Systems Patient Roles: Parent(employee) Contact Information: Sister, Ellen Hill Anticipated Caregiver: friends and family Anticipated Caregiver's Contact Information: see above Ability/Limitations of Caregiver: Ellen Hill works; her best freind works but Estate manager/land agent adn pt state they will arrrange friends and family to provide close to 24/7 asisst Caregiver Availability: 24/7 Discharge Plan Discussed with Primary Caregiver: Yes Is Caregiver In Agreement with Plan?: Yes Does Caregiver/Family have Issues with Lodging/Transportation while Pt is in Rehab?:  No  Goals/Additional Needs Patient/Family Goal for Rehab: min assist with PT and OT at wheelchair level Expected  length of stay: elos 22 TO 27 days Equipment Needs: [place] for transfers on admission Pt/Family Agrees to Admission and willing to participate: Yes Program Orientation Provided & Reviewed with Pt/Caregiver Including Roles  & Responsibilities: Yes  Barriers to Discharge: Neurogenic Bowel & Bladder, Insurance for SNF coverage, Weight  Decrease burden of Care through IP rehab admission: n/a  Possible need for SNF placement upon discharge: if pt does not progress to min assist wheelchair level; I discussed with pt that [place] not guaranteed to pay for both CIR and SNF. Multiple discussions of the need to be at her friend's home which is one level and wheelchair accessible.  Patient Condition: This patient's medical and functional status has changed since the consult dated: 01/21/2019 in which the Rehabilitation Physician determined and documented that the patient's condition is appropriate for intensive rehabilitative care in an inpatient rehabilitation facility. See "History of Present Illness" (above) for medical update. Functional changes are: max assist with lift used to transfer. Patient's medical and functional status update has been discussed with the Rehabilitation physician and patient remains appropriate for inpatient rehabilitation. Will admit to inpatient rehab today.  Preadmission Screen Completed By:  Cleatrice Burke, 01/29/2019 9:53 AM ______________________________________________________________________   Discussed status with Dr. Naaman Plummer on 01/29/2019 at  1127 and received telephone approval for admission today.  Admission Coordinator:  Cleatrice Burke, time 0375 Date 01/29/2019           Cosigned by: [name], MD at 01/29/2019 11:30 AM  Revision History

## 2019-01-29 NOTE — Discharge Summary (Signed)
Physician Discharge Summary  Patient ID: Ellen Hill MRN: [id number] DOB/AGE: [birth year] 46 y.o.  Admit date: 01/17/2019 Discharge date: 01/29/2019  Admission Diagnoses: Thoracic myelopathy  Discharge Diagnoses:  Active Problems:   Thoracic myelopathy   Discharged Condition: fair  Hospital Course: Ellen Hill was admitted on 01/17/2019 due to severe thoracic myelopathy. She underwent T9, T10, T11, T12, and partial L1 decompressive laminectomies. Her weakness and dysthesias are slowly improving post operatively. Her post op course was complicated by a urinary tract infection. She is still receiving Bactrim for treatment. WBC improving. She will discharge to inpatient rehabilitation to continue building her strength and endurance.  Consults: rehabilitation medicine  Significant Diagnostic Studies:  Results for orders placed or performed during the hospital encounter of 01/17/19 (from the past 24 hour(s))  CBC with Differential/Platelet     Status: Abnormal   Collection Time: 01/28/19 12:10 PM  Result Value Ref Range   WBC 17.7 (H) 4.0 - 10.5 K/uL   RBC 4.80 3.87 - 5.11 MIL/uL   Hemoglobin 11.0 (L) 12.0 - 15.0 g/dL   HCT 36.7 36.0 - 46.0 %   MCV 76.5 (L) 80.0 - 100.0 fL   MCH 22.9 (L) 26.0 - 34.0 pg   MCHC 30.0 30.0 - 36.0 g/dL   RDW 16.6 (H) 11.5 - 15.5 %   Platelets 182 150 - 400 K/uL   nRBC 0.0 0.0 - 0.2 %   Neutrophils Relative % 74 %   Neutro Abs 13.0 (H) 1.7 - 7.7 K/uL   Lymphocytes Relative 15 %   Lymphs Abs 2.6 0.7 - 4.0 K/uL   Monocytes Relative 9 %   Monocytes Absolute 1.6 (H) 0.1 - 1.0 K/uL   Eosinophils Relative 0 %   Eosinophils Absolute 0.0 0.0 - 0.5 K/uL   Basophils Relative 0 %   Basophils Absolute 0.0 0.0 - 0.1 K/uL   Immature Granulocytes 2 %   Abs Immature Granulocytes 0.43 (H) 0.00 - 0.07 K/uL     Treatments: surgery: T9, T10, T11, T12, and partial L1 decompressive laminectomies  Discharge Exam: Blood pressure 133/73, pulse (!) 108, temperature  98 F (36.7 C), temperature source Oral, resp. rate 18, height 5\' 6"  (1.676 m), weight (!) 182 kg, SpO2 100 %.  Alert and oriented x 4 Motor and sensory function of BUE intact Strength RLE 3/5, LLE 1/5 Sensation improving in BLE Abdomen soft Incision clean, dry, and intact  Disposition: Discharge disposition: [place] Not Defined        Allergies as of 01/29/2019   No Known Allergies     Medication List    STOP taking these medications   methylPREDNISolone 4 MG Tbpk tablet Commonly known as:  MEDROL DOSEPAK   naproxen 500 MG tablet Commonly known as:  NAPROSYN     TAKE these medications   cyclobenzaprine 10 MG tablet Commonly known as:  FLEXERIL Take 10 mg by mouth every 8 (eight) hours. What changed:  Another medication with the same name was removed. Continue taking this medication, and follow the directions you see here.   gabapentin 300 MG capsule Commonly known as:  NEURONTIN Take 1 capsule (300 mg total) by mouth 3 (three) times daily.   HYDROcodone-acetaminophen 5-325 MG tablet Commonly known as:  NORCO/VICODIN Take 1-2 tablets by mouth every 4 (four) hours as needed for moderate pain. What changed:    how much to take  when to take this  reasons to take this   ibuprofen 800 MG tablet Commonly  known as:  ADVIL,MOTRIN Take 800 mg by mouth 3 (three) times daily as needed for moderate pain. What changed:  Another medication with the same name was removed. Continue taking this medication, and follow the directions you see here.   megestrol 40 MG tablet Commonly known as:  MEGACE Take 1 tablet (40 mg total) by mouth 2 (two) times daily. Can increase to two tablets twice a day in the event of heavy bleeding What changed:    when to take this  additional instructions   oxyCODONE 5 MG immediate release tablet Commonly known as:  Oxy IR/ROXICODONE Take 1-2 tablets (5-10 mg total) by mouth every 6 (six) hours as needed for  breakthrough pain.   sulfamethoxazole-trimethoprim 800-160 MG tablet Commonly known as:  BACTRIM DS,SEPTRA DS Take 1 tablet by mouth every 12 (twelve) hours for 4 days.      Follow-up Information    Earnie Larsson, MD. Schedule an appointment as soon as possible for a visit in 2 week(s).   Specialty:  Neurosurgery Contact information: [address] [place] [address] [phone]           Signed: Viona Gilmore, DNP, AGNP-C Nurse Practitioner  [facility] [place] [address], [place], [place] [address] P: [phone]    F: [phone]  01/29/2019, 9:20 AM

## 2019-01-29 NOTE — Progress Notes (Signed)
Kirsteins, Luanna Salk, MD  Physician  Physical Medicine and Rehabilitation  Consult Note  Signed  Date of Service:  01/21/2019 2:06 PM       Related encounter: ED to Hosp-Admission (Current) from 01/17/2019 in [place]      Signed      Expand All Collapse All    Show:Clear all [x] Manual[x] Template[] Copied  Added by: [x] Angiulli, Lavon Paganini, PA-C[x] Kirsteins, Luanna Salk, MD  [] Hover for details      Physical Medicine and Rehabilitation Consult Reason for Consult:Gait instability with bilat LE weakness Referring Physician: Dr Annette Stable   HPI: Ellen Hill is a 48 y.o. right handed female with history of migraine headaches, obesity with BMI 64.76. per chart review patient lives with sister and 60 year old daughter. 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 Sister works during the day. Presented 01/17/2019 with gait instability bilateral lower extremity weakness 2-1/2 months to the point she was essentially nonambulatory. 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 Patient underwent T9, T10, T11-12 and partial L1 decompressive laminotomies 01/19/2019 per Dr. Annette Stable.No back brace needed. Surgical PCR screen positive with contact precautions. Hospital course pain management. Decadron protocol as indicated. Therapy evaluations completed with recommendations of physical medicine rehabilitation consult.   Review of Systems  Constitutional: Positive for malaise/fatigue. Negative for chills and fever.  HENT: Negative for hearing loss.   Eyes: Negative for blurred vision and double vision.  Respiratory: Positive for shortness of breath.   Cardiovascular: Positive for leg swelling. Negative for chest pain and palpitations.  Gastrointestinal: Positive  for constipation. Negative for nausea and vomiting.  Genitourinary: Negative for dysuria, flank pain and hematuria.  Musculoskeletal: Positive for back pain and myalgias.  Skin: Negative for rash.  Neurological: Positive for weakness and headaches.  All other systems reviewed and are negative.      Past Medical History:  Diagnosis Date  . Abnormal Pap smear    history  . Anemia   . ASCUS (atypical squamous cells of undetermined significance) on Pap smear 2008   At 36wk of pregnancy ; colpo  . Asthma    rarely uses inhaler (seasonal)  . 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 Fibroids   . H/O candidiasis   . H/O fatigue 08/2007  . H/O varicella   . Headache(784.0)    migraines   . Heart murmur    dx child - no problems as adult  . Hx: UTI (urinary tract infection) 07/29/07  . Increased BMI   . Irregular bleeding   . Migraines   . Obesity 02/13/12  . Pelvic pain 02/13/12  . Postpartum anemia 08/18/07  . 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 after loss of parents, is fine  . Vaginal Pap smear, abnormal    f/u wnl        Past Surgical History:  Procedure Laterality Date  . CERVICAL POLYPECTOMY  05/01/2012   Procedure: CERVICAL POLYPECTOMY;  Surgeon: Betsy Coder, MD;  Location: [facility];  Service: Gynecology;  Laterality: N/A;  . CESAREAN SECTION  07/04/2007  . CHOLECYSTECTOMY  07/06/2013  . CHOLECYSTECTOMY N/A 07/06/2013   Procedure: LAPAROSCOPIC CHOLECYSTECTOMY ;  Surgeon: Gayland Curry, MD;  Location: [facility];  Service: General;  Laterality: N/A;  attempted cholangiogram  . fibroidecto    . fibroidectomy  07/04/2007  .  LUMBAR LAMINECTOMY/DECOMPRESSION MICRODISCECTOMY N/A 01/19/2019   Procedure: Thoracic Nine-Lumbar One Decompressive Laminectomy;  Surgeon: Earnie Larsson, MD;  Location: [facility];  Service: Neurosurgery;  Laterality: N/A;  posterior  . MYOMECTOMY  07/04/08  . OPERATIVE HYSTEROSCOPY  05/01/2012   no menses since that time  . TUBAL LIGATION   07/04/2007        Family History  Problem Relation Age of Onset  . Hypertension Mother   . Cancer Mother        Breast  . Breast cancer Mother   . Hypertension Father   . Cancer Father        Prostate  . Heart attack Father   . Hypertension Sister   . Diabetes Maternal Aunt    Social History:  reports that she quit smoking about 19 years ago. Her smoking use included cigarettes. She has a 0.30 pack-year smoking history. She has never used smokeless tobacco. She reports that she does not drink alcohol or use drugs. Allergies: No Known Allergies       Medications Prior to Admission  Medication Sig Dispense Refill  . cyclobenzaprine (FLEXERIL) 10 MG tablet Take 10 mg by mouth every 8 (eight) hours.    Marland Kitchen HYDROcodone-acetaminophen (NORCO/VICODIN) 5-325 MG tablet Take 1 tablet by mouth every 6 (six) hours.    Marland Kitchen ibuprofen (ADVIL,MOTRIN) 800 MG tablet Take 800 mg by mouth 3 (three) times daily as needed for moderate pain.     . megestrol (MEGACE) 40 MG tablet Take 1 tablet (40 mg total) by mouth 2 (two) times daily. Can increase to two tablets twice a day in the event of heavy bleeding (Patient taking differently: Take 40 mg by mouth daily. ) 60 tablet 3  . naproxen (NAPROSYN) 500 MG tablet Take 1 tablet (500 mg total) by mouth 2 (two) times daily. 20 tablet 0  . cyclobenzaprine (FLEXERIL) 5 MG tablet TAKE 1 TABLET BY MOUTH THREE TIMES A DAY AS NEEDED FOR MUSCLE SPASMS (Patient not taking: No sig reported) 30 tablet 0  . ibuprofen (ADVIL,MOTRIN) 600 MG tablet Take 1 tablet (600 mg total) by mouth every 6 (six) hours as needed. (Patient not taking: Reported on 01/17/2019) 30 tablet 0  . methylPREDNISolone (MEDROL DOSEPAK) 4 MG TBPK tablet As directed (Patient not taking: Reported on 01/17/2019) 21 tablet 0    Home: Home Living Family/patient expects to be discharged to:: Private residence Living Arrangements: Other relatives, Children(sister and 22 y/o daughter) Available  Help at Discharge: Family Type of Home: Apartment Home Access: Stairs to enter Technical brewer of Steps: 14 Entrance Stairs-Rails: Right, Left(split railing) Home Layout: One level Bathroom Shower/Tub: Chiropodist: Standard Home Equipment: None Additional Comments: pt lives with sister and 41 yo daughter  Functional History: Prior Function Level of Independence: Independent Comments: pt is a Database administrator for [employer] Functional Status:  Mobility: Bed Mobility Overal bed mobility: (P) Needs Assistance Bed Mobility: (P) Rolling, Supine to Sit Rolling: (P) Max assist, +2 for physical assistance General bed mobility comments: (P) max +3 assist to roll to left with rail, cues and physical assist to lift and move RLE as well as pad to rotate pelvis. Pt with painful LLE and unable to maintain. Utilized chair positioning of bed to achieve semi sitting position then 3 person assist with pad to pivot to EOB. Grossly 45 degree pivot before pt unable to tolerate due to pain and need for 3 people to return to supine. 3 person assist to slide to [facility] in  trendelenburg Transfers General transfer comment: (P) unable  ADL: ADL Overall ADL's : Needs assistance/impaired Eating/Feeding: Minimal assistance, Sitting(chair position in bed with pillow supporting spine) Eating/Feeding Details (indicate cue type and reason): cut up food to assist with managing fatigue Grooming: Minimal assistance, Bed level Upper Body Bathing: (P) Bed level, Moderate assistance Lower Body Bathing: (P) Total assistance, Bed level, +2 for physical assistance, +2 for safety/equipment Upper Body Dressing : (P) Moderate assistance Lower Body Dressing: (P) Total assistance, +2 for physical assistance, +2 for safety/equipment Toilet Transfer: (P) Requires wide/bariatric(lift only at this point, or bed level) Toileting- Clothing Manipulation and Hygiene: (P) +2 for physical assistance, Total assistance, +2  for safety/equipment  Cognition: Cognition Overall Cognitive Status: Within Functional Limits for tasks assessed Orientation Level: (P) Oriented X4 Cognition Arousal/Alertness: Awake/alert Behavior During Therapy: WFL for tasks assessed/performed Overall Cognitive Status: Within Functional Limits for tasks assessed  Blood pressure (!) 153/87, pulse 99, temperature 98.3 F (36.8 C), resp. rate (!) 21, height 5\' 6"  (1.676 m), weight (!) 182 kg, SpO2 100 %. Physical Exam  Vitals reviewed. Constitutional: She is oriented to person, place, and time.  48 year old right-handed morbidly obese female  HENT:  Head: Normocephalic.  Eyes: EOM are normal.  Neck: Normal range of motion. Neck supple. No thyromegaly present.  Cardiovascular: Normal rate and regular rhythm.  Respiratory: Effort normal and breath sounds normal. No respiratory distress.  GI: Soft. Bowel sounds are normal. She exhibits no distension.  Neurological: She is alert and oriented to person, place, and time. She displays no tremor. A sensory deficit is present. Gait abnormal.  5/5 bilateral deltoid bicep tricep grip Trace bilateral hip flexor knee extensor to minus bilateral ankle dorsiflexor and plantar flexor Sensation is absent below the umbilicus to the knees but has normal light touch sensation at the ankle and below  Psychiatric: She has a normal mood and affect.    LabResultsLast24Hours  No results found for this or any previous visit (from the past 24 hour(s)).   ImagingResults(Last48hours)  No results found.     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 Does the need for close, 24 hr/day medical supervision in concert with the patient's rehab needs make it unreasonable for this patient to be served in a less intensive setting? Yes 2. Co-Morbidities requiring supervision/potential complications: Morbid obesity,  anemia, neurogenic bowel and bladder 3. Due to bladder management, bowel management, safety, skin/wound care, disease management, medication administration, pain management and patient education, does the patient require 24 hr/day rehab nursing? Yes 4. Does the patient require coordinated care of a physician, rehab nurse, PT (1-2 hrs/day, 5 days/week) and OT (1-2 hrs/day, 5 days/week) to address physical and functional deficits in the context of the above medical diagnosis(es)? Yes Addressing deficits in the following areas: balance, endurance, locomotion, strength, transferring, bowel/bladder control, bathing, dressing, feeding, grooming, toileting and psychosocial support 5. Can the patient actively participate in an intensive therapy program of at least 3 hrs of therapy per day at least 5 days per week? Yes 6. The potential for patient to make measurable gains while on inpatient rehab is good 7. Anticipated functional outcomes upon discharge from inpatient rehab are min assist WC level with PT, min assist UE and Mod A LE ADL with OT, n/a with SLP. 8. Estimated rehab length of stay to reach the above functional goals is: 22-27d 9. Anticipated D/C setting: Home 10. Anticipated post D/C treatments: [place] therapy 11. Overall Rehab/Functional  Prognosis: good  RECOMMENDATIONS: This patient's condition is appropriate for continued rehabilitative care in the following setting: CIR Patient has agreed to participate in recommended program. Yes Note that insurance prior authorization may be required for reimbursement for recommended care.  Comment: Patient has a sister that works nights.  Will need 24-hour care.  Patient may need SNF after CIR "I have personally performed a face to face diagnostic evaluation of this patient.  Additionally, I have reviewed and concur with the physician assistant's documentation above." Charlett Blake M.D. [facility] FAAPM&R (Sports Med, Neuromuscular  Med) Diplomate Am Board of Electrodiagnostic Med  Elizabeth Sauer 01/21/2019        Revision History                        Routing History

## 2019-01-29 NOTE — H&P (Cosign Needed)
Physical Medicine and Rehabilitation Admission H&P        Chief Complaint  Patient presents with  . Back Pain  : HPI: Ellen Hill is a 48 year old right-handed female with history of migraine headaches, obesity with BMI 64.76. Per chart review patient lives with her sister 46 year old daughter. One level apartment with multiple steps to entry. 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 Sister works during the day. She has a neighbor friend who planned to assist she would stay with on discharge with accessible home. Presented 01/17/2019 with gait instability bilateral lower extremity weakness 2-1/2 months to the point she was essentially nonambulatory. 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 Patient underwent T9, T10, T11-12 and partial L1 decompressive laminotomies 01/19/2019 per Dr. Annette Stable. No back brace required. Surgical PCR screening positive for contact precautions. Hospital course ongoing pain management. Decadron protocol as indicated. Elevated WBC 20,500 decreased to 17,700 monitored closely while on steroid therapy with urine culture completed 01/27/2019 greater 100,000 Proteus and 50,000 Escherichia coli and currently maintained on Bactrim. Bouts of diarrhea noted patient initially did have a a rectal tube in  place and since has been removed.Therapy evaluations completed with recommendations of physical medicine rehabilitation consult. Patient was admitted for a comprehensive rehabilitation program.   Review of Systems  Constitutional: Positive for malaise/fatigue. Negative for fever.  HENT: Negative for hearing loss.   Eyes: Negative for blurred vision and double vision.  Respiratory: Positive for shortness of breath.   Cardiovascular: Positive for leg swelling. Negative for chest pain.  Gastrointestinal:  Positive for diarrhea. Negative for nausea and vomiting.  Genitourinary: Negative for dysuria and hematuria.  Musculoskeletal: Positive for back pain and myalgias.  Neurological: Positive for weakness.  All other systems reviewed and are negative.       Past Medical History:  Diagnosis Date  . Abnormal Pap smear      history  . Anemia    . ASCUS (atypical squamous cells of undetermined significance) on Pap smear 2008    At 36wk of pregnancy ; colpo  . Asthma      rarely uses inhaler (seasonal)  . Condylomata acuminata in female    . Fibroids    . H/O candidiasis    . H/O fatigue 08/2007  . H/O varicella    . Headache(784.0)      migraines   . Heart murmur      dx child - no problems as adult  . Hx: UTI (urinary tract infection) 07/29/07  . Increased BMI    . Irregular bleeding    . Migraines    . Obesity 02/13/12  . Pelvic pain 02/13/12  . Postpartum anemia 08/18/07  . Postpartum depression 08/18/07    ? after loss of parents, is fine  . Vaginal Pap smear, abnormal      f/u wnl         Past Surgical History:  Procedure Laterality Date  . CERVICAL POLYPECTOMY   05/01/2012    Procedure: CERVICAL POLYPECTOMY;  Surgeon: Betsy Coder, MD;  Location: [facility];  Service: Gynecology;  Laterality: N/A;  . CESAREAN SECTION   07/04/2007  . CHOLECYSTECTOMY   07/06/2013  . CHOLECYSTECTOMY N/A 07/06/2013    Procedure: LAPAROSCOPIC CHOLECYSTECTOMY ;  Surgeon: Gayland Curry, MD;  Location: [facility];  Service: General;  Laterality:  N/A;  attempted cholangiogram  . fibroidecto      . fibroidectomy   07/04/2007  . LUMBAR LAMINECTOMY/DECOMPRESSION MICRODISCECTOMY N/A 01/19/2019    Procedure: Thoracic Nine-Lumbar One Decompressive Laminectomy;  Surgeon: Earnie Larsson, MD;  Location: [facility];  Service: Neurosurgery;  Laterality: N/A;  posterior  . MYOMECTOMY   07/04/08  . OPERATIVE HYSTEROSCOPY   05/01/2012    no menses since that time  . TUBAL LIGATION   07/04/2007         Family History  Problem  Relation Age of Onset  . Hypertension Mother    . Cancer Mother          Breast  . Breast cancer Mother    . Hypertension Father    . Cancer Father          Prostate  . Heart attack Father    . Hypertension Sister    . Diabetes Maternal Aunt      Social History:  reports that she quit smoking about 19 years ago. Her smoking use included cigarettes. She has a 0.30 pack-year smoking history. She has never used smokeless tobacco. She reports that she does not drink alcohol or use drugs. Allergies: No Known Allergies Medications Prior to Admission  Medication Sig Dispense Refill  . cyclobenzaprine (FLEXERIL) 10 MG tablet Take 10 mg by mouth every 8 (eight) hours.      Marland Kitchen HYDROcodone-acetaminophen (NORCO/VICODIN) 5-325 MG tablet Take 1 tablet by mouth every 6 (six) hours.      Marland Kitchen ibuprofen (ADVIL,MOTRIN) 800 MG tablet Take 800 mg by mouth 3 (three) times daily as needed for moderate pain.       . megestrol (MEGACE) 40 MG tablet Take 1 tablet (40 mg total) by mouth 2 (two) times daily. Can increase to two tablets twice a day in the event of heavy bleeding (Patient taking differently: Take 40 mg by mouth daily. ) 60 tablet 3  . naproxen (NAPROSYN) 500 MG tablet Take 1 tablet (500 mg total) by mouth 2 (two) times daily. 20 tablet 0  . cyclobenzaprine (FLEXERIL) 5 MG tablet TAKE 1 TABLET BY MOUTH THREE TIMES A DAY AS NEEDED FOR MUSCLE SPASMS (Patient not taking: No sig reported) 30 tablet 0  . ibuprofen (ADVIL,MOTRIN) 600 MG tablet Take 1 tablet (600 mg total) by mouth every 6 (six) hours as needed. (Patient not taking: Reported on 01/17/2019) 30 tablet 0  . methylPREDNISolone (MEDROL DOSEPAK) 4 MG TBPK tablet As directed (Patient not taking: Reported on 01/17/2019) 21 tablet 0      Drug Regimen Review  Drug regimen was reviewed and remains appropriate with no significant issues identified   Home: Home Living Family/patient expects to be discharged to:: Private residence Living Arrangements: Other  relatives, Children(sister and 59 y/o daughter) Available Help at Discharge: Family Type of Home: Apartment Home Access: Stairs to enter Technical brewer of Steps: 14 Entrance Stairs-Rails: Right, Left(split railing) Home Layout: One level Bathroom Shower/Tub: Chiropodist: Standard Home Equipment: None Additional Comments: pt lives with sister and 65 yo daughter   Functional History: Prior Function Level of Independence: Independent Comments: pt is a Database administrator for [employer]   Functional Status:  Mobility: Bed Mobility Overal bed mobility: Needs Assistance Bed Mobility: Rolling Rolling: Max assist, +2 for physical assistance Supine to sit: Total assist, +2 for physical assistance General bed mobility comments: max +3 assist to roll to right with rail, cues and physical assist to lift and move LLE as well as  pad to rotate pelvis. Pt with painful LLE and required mod assist to maintain. Max +2 to roll to left with cues and assist to bend and move RLE. Placed sling with rolling and able to use maxisky to transfer from regular bed to bari total lift bed Transfers Transfer via Lift Equipment: [place] transfer comment: +3 assist for lift from bed to vital go tilt bed, use of vital go tilt bed with tilting features this session  Ambulation/Gait General Gait Details: unable   ADL: ADL Overall ADL's : Needs assistance/impaired Eating/Feeding: Set up, Sitting Eating/Feeding Details (indicate cue type and reason): supported sitting at bed level Grooming: Set up, Bed level, Wash/dry hands, Wash/dry face, Sitting Grooming Details (indicate cue type and reason): sitting in recliner at end of session Upper Body Bathing: Bed level, Moderate assistance Lower Body Bathing: Total assistance, Bed level, +2 for physical assistance, +2 for safety/equipment Upper Body Dressing : Moderate assistance Lower Body Dressing: Total assistance, +2 for physical assistance,  +2 for safety/equipment Toilet Transfer: Total assistance Toilet Transfer Details (indicate cue type and reason): maxi sky used Toileting- Clothing Manipulation and Hygiene: Total assistance Toileting - Clothing Manipulation Details (indicate cue type and reason): currently with flexi seal General ADL Comments: assisted with transition  to vital go tild bed and completing initial tilt, pt tolerating well   Cognition: Cognition Overall Cognitive Status: Within Functional Limits for tasks assessed Orientation Level: Oriented X4 Cognition Arousal/Alertness: Awake/alert Behavior During Therapy: WFL for tasks assessed/performed Overall Cognitive Status: Within Functional Limits for tasks assessed   Physical Exam: Blood pressure 124/90, pulse (!) 102, temperature (!) 97.5 F (36.4 C), temperature source Oral, resp. rate 16, height 5\' 6"  (1.676 m), weight (!) 182 kg, SpO2 100 %. Physical Exam  Constitutional: No distress.  HENT:  Head: Normocephalic.  Eyes: Pupils are equal, round, and reactive to light.  Neck: Normal range of motion. No tracheal deviation present. No thyromegaly present.  Cardiovascular: Normal rate.  Respiratory: Effort normal.  GI: Soft. She exhibits no distension.  Neurological: She is alert.  Patient is alert sitting up in bed. Mood is a bit flat but appropriate. She is oriented to person place and time. Follows full commands. UE 5/5. RLE tr HF, tr-1/5 KE, 1 to 1+/5 ADF/PF. LLE: 0/5 prox to distal. Decreased sensation to light touch/pain up to waist.   Skin: Skin is warm.  Psychiatric: She has a normal mood and affect. Her behavior is normal. Judgment and thought content normal.      Lab Results Last 48 Hours        Results for orders placed or performed during the hospital encounter of 01/17/19 (from the past 48 hour(s))  Culture, Urine     Status: Abnormal (Preliminary result)    Collection Time: 01/27/19 10:00 AM  Result Value Ref Range    Specimen  Description URINE, RANDOM      Special Requests          NONE Performed at [facility], [address], [place], [place] [address]      Culture (A)        >=100,000 COLONIES/mL PROTEUS MIRABILIS 50,000 COLONIES/mL ESCHERICHIA COLI      Report Status PENDING    Urinalysis, Routine w reflex microscopic     Status: Abnormal    Collection Time: 01/27/19 10:12 AM  Result Value Ref Range    Color, Urine AMBER (A) YELLOW      Comment: BIOCHEMICALS MAY BE AFFECTED BY COLOR  APPearance TURBID (A) CLEAR    Specific Gravity, Urine 1.033 (H) 1.005 - 1.030    pH 7.0 5.0 - 8.0    Glucose, UA NEGATIVE NEGATIVE mg/dL    Hgb urine dipstick LARGE (A) NEGATIVE    Bilirubin Urine NEGATIVE NEGATIVE    Ketones, ur NEGATIVE NEGATIVE mg/dL    Protein, ur 100 (A) NEGATIVE mg/dL    Nitrite NEGATIVE NEGATIVE    Leukocytes, UA LARGE (A) NEGATIVE    RBC / HPF >50 (H) 0 - 5 RBC/hpf    WBC, UA >50 (H) 0 - 5 WBC/hpf    Bacteria, UA MANY (A) NONE SEEN    WBC Clumps PRESENT      Mucus PRESENT        Comment: Performed at [facility], [address], [place], [place] [address]  CBC     Status: Abnormal    Collection Time: 01/27/19 11:50 AM  Result Value Ref Range    WBC 20.5 (H) 4.0 - 10.5 K/uL    RBC 4.69 3.87 - 5.11 MIL/uL    Hemoglobin 11.1 (L) 12.0 - 15.0 g/dL    HCT 35.1 (L) 36.0 - 46.0 %    MCV 74.8 (L) 80.0 - 100.0 fL    MCH 23.7 (L) 26.0 - 34.0 pg    MCHC 31.6 30.0 - 36.0 g/dL    RDW 16.7 (H) 11.5 - 15.5 %    Platelets 180 150 - 400 K/uL    nRBC 0.0 0.0 - 0.2 %      Comment: Performed at [facility], [address], [place], [place] [address]  CBC with Differential/Platelet     Status: Abnormal    Collection Time: 01/28/19 12:10 PM  Result Value Ref Range    WBC 17.7 (H) 4.0 - 10.5 K/uL    RBC 4.80 3.87 - 5.11 MIL/uL    Hemoglobin 11.0 (L) 12.0 - 15.0 g/dL    HCT 36.7 36.0 - 46.0 %    MCV 76.5 (L) 80.0 - 100.0 fL    MCH 22.9 (L) 26.0 - 34.0 pg    MCHC 30.0 30.0 -  36.0 g/dL    RDW 16.6 (H) 11.5 - 15.5 %    Platelets 182 150 - 400 K/uL    nRBC 0.0 0.0 - 0.2 %    Neutrophils Relative % 74 %    Neutro Abs 13.0 (H) 1.7 - 7.7 K/uL    Lymphocytes Relative 15 %    Lymphs Abs 2.6 0.7 - 4.0 K/uL    Monocytes Relative 9 %    Monocytes Absolute 1.6 (H) 0.1 - 1.0 K/uL    Eosinophils Relative 0 %    Eosinophils Absolute 0.0 0.0 - 0.5 K/uL    Basophils Relative 0 %    Basophils Absolute 0.0 0.0 - 0.1 K/uL    Immature Granulocytes 2 %    Abs Immature Granulocytes 0.43 (H) 0.00 - 0.07 K/uL      Comment: Performed at [facility] [address] [place], [place] [address]      Imaging Results (Last 48 hours)  No results found.           Medical Problem List and Plan: 1.  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 No back brace required             -admit to inpatient rehab 2.  DVT Prophylaxis/Anticoagulation: SCDs. Check vascular  study 3. Pain Management: Neurontin 300 mg 3 times a day, Flexeril 10 mg every 8 hours and when necessary, hydrocodone as needed 4. Mood:  Provide emotional support 5. Neuropsych: This patient is capable of making decisions on her own behalf. 6. Skin/Wound Care:  Routine skin checks 7. Fluids/Electrolytes/Nutrition:  Routine ins and outs with follow-up chemistries 8. Proteus/Escherichia coliUTI. Bactrim 01/27/2019 with sensitivities pending 9. Morbid obesity. Latest BMI 64.79. Dietary follow-up 10. Neurogenic bowel and bladder. Establish bowel program. Check PVR. Provided education.   Post Admission Physician Evaluation: 1. Functional deficits secondary  to thoracic myelopathy. 2. Patient is admitted to receive collaborative, interdisciplinary care between the physiatrist, rehab nursing staff, and therapy team. 3. Patient's level of medical complexity and substantial therapy needs in context of that medical necessity  cannot be provided at a lesser intensity of care such as a SNF. 4. Patient has experienced substantial functional loss from his/her baseline which was documented above under the "Functional History" and "Functional Status" headings.  Judging by the patient's diagnosis, physical exam, and functional history, the patient has potential for functional progress which will result in measurable gains while on inpatient rehab.  These gains will be of substantial and practical use upon discharge  in facilitating mobility and self-care at the household level. 5. Physiatrist will provide 24 hour management of medical needs as well as oversight of the therapy plan/treatment and provide guidance as appropriate regarding the interaction of the two. 6. The Preadmission Screening has been reviewed and patient status is unchanged unless otherwise stated above. 7. 24 hour rehab nursing will assist with bladder management, bowel management, safety, skin/wound care, disease management, medication administration, pain management and patient education  and help integrate therapy concepts, techniques,education, etc. 8. PT will assess and treat for/with: Lower extremity strength, range of motion, stamina, balance, functional mobility, safety, adaptive techniques and equipment, NMR, w/c assessment, family ed.   Goals are: min assist. 9. OT will assess and treat for/with: ADL's, functional mobility, safety, upper extremity strength, adaptive techniques and equipment, NMR, w/c use, pain control, family ed.   Goals are: min assist. Therapy may proceed with showering this patient. 10. SLP will assess and treat for/with: n/a.  Goals are: n/a. 11. Case Management and Social Worker will assess and treat for psychological issues and discharge planning. 12. Team conference will be held weekly to assess progress toward goals and to determine barriers to discharge. 13. Patient will receive at least 3 hours of therapy per day at least 5 days  per week. 14. ELOS: 22-27 days       15. Prognosis:  excellent     I have personally performed a face to face diagnostic evaluation of this patient and formulated the key components of the plan.  Additionally, I have personally reviewed laboratory data, imaging studies, as well as relevant notes and concur with the physician assistant's documentation above.   [name], MD, Mell[facility] Drown     Lavon Paganini Lankin, PA-C 01/29/2019  The patient's status has not changed. The original post admission physician evaluation remains appropriate, and any changes from the pre-admission screening or documentation from the acute chart are noted above.  [name], MD 01/29/2019

## 2019-01-29 NOTE — PMR Pre-admission (Signed)
PMR Admission Coordinator Pre-Admission Assessment  Patient: Ellen Hill is an 48 y.o., female MRN: [id number] DOB: [birth year] Height: 5\' 6"  (167.6 cm) Weight: (!) 182 kg              Insurance Information HMO:     PPO: yes     PCP:      IPA:      80/20:      OTHER:  PRIMARY: [place] BC[employer] [place]      Policy#: [id number]      Subscriber: pt CM Name: [name]      Phone#: [phone]     Fax#: [phone] Pre-Cert#: [id number] approved for 7 days       Employer: [employer] Benefits:  Phone #: [phone]     Name: 01/28/2019 Eff. 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  Presented 01/17/2019 with gait instability bilateral lower extremity weakness 2-1/2 months to the point she was essentially nonambulatory. 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 Patient underwent T9, T10, T11-12 and partial L1 decompressive laminotomies 01/19/2019 per Dr. Annette Stable. No back brace required. Surgical PCR screening positive for contact precautions. Hospital course ongoing pain management. Decadron protocol  as indicated. Elevated WBC 20,500 decreased to 17,700 monitored closely while on steroid therapy with urine culture completed 01/27/2019 greater 100,000 Proteus and 50,000 Escherichia coli and currently maintained on Bactrim. Bouts of diarrhea noted patient initially did have a rectal tube in  place and since has been removed.   Past Medical History  Past Medical History:  Diagnosis Date  . Abnormal Pap smear    history  . Anemia   . ASCUS (atypical squamous cells of undetermined significance) on Pap smear 2008   At 36wk of pregnancy ; colpo  . Asthma    rarely uses inhaler (seasonal)  . 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 Fibroids   . H/O candidiasis   . H/O fatigue 08/2007  . H/O varicella   . Headache(784.0)    migraines   . Heart murmur    dx child - no problems as adult  . Hx: UTI (urinary tract infection) 07/29/07  . Increased BMI   . Irregular bleeding   . Migraines   . Obesity 02/13/12  . Pelvic pain 02/13/12  . Postpartum anemia 08/18/07  . 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 after loss of parents, is fine  . Vaginal Pap smear, abnormal    f/u wnl  Family History  family history includes Breast cancer in her mother; Cancer in her father and mother; Diabetes in her maternal aunt; Heart attack in her father; Hypertension in her father, mother, and sister.  Prior Rehab/Hospitalizations:  Has the patient had major surgery during 100 days prior to admission? No  Current Medications   Current Facility-Administered Medications:  .  0.9 %  sodium chloride infusion, 250 mL, Intravenous, PRN, Costella, Vincent J, PA-C .  0.9 %  sodium chloride infusion, 250 mL, Intravenous, Continuous, Pool, Henry, MD .  acetaminophen (TYLENOL) tablet 650 mg, 650 mg, Oral, Q4H PRN, 650 mg at 01/26/19 1723 **OR** acetaminophen (TYLENOL) suppository 650 mg, 650 mg, Rectal, Q4H PRN, Pool, Mallie Mussel, MD .  alum & mag hydroxide-simeth (MAALOX/MYLANTA) 200-200-20 MG/5ML suspension 30 mL, 30 mL, Oral, Q6H  PRN, Kristeen Miss, MD, 30 mL at 01/27/19 0204 .  bisacodyl (DULCOLAX) EC tablet 5 mg, 5 mg, Oral, Daily PRN, Costella, Vincent J, PA-C, 5 mg at 01/22/19 1800 .  bisacodyl (DULCOLAX) suppository 10 mg, 10 mg, Rectal, Daily PRN, Earnie Larsson, MD .  cyclobenzaprine (FLEXERIL) tablet 10 mg, 10 mg, Oral, Q8H, Costella, Vincent J, PA-C, 10 mg at 01/29/19 3419 .  cyclobenzaprine (FLEXERIL) tablet 10 mg, 10 mg, Oral, TID PRN, Earnie Larsson, MD .  dexamethasone (DECADRON) injection 4 mg, 4 mg, Intravenous, Q12H **OR** dexamethasone (DECADRON) tablet 2 mg, 2 mg, Oral, Q12H, Earnie Larsson, MD, 2 mg at 01/29/19 0923 .  feeding supplement (BOOST / RESOURCE BREEZE) liquid 1 Container, 1 Container, Oral, BID BM, Earnie Larsson, MD, 1 Container at 01/27/19 1050 .  gabapentin (NEURONTIN) capsule 300 mg, 300 mg, Oral, TID, Earnie Larsson, MD, 300 mg at 01/29/19 0923 .  HYDROcodone-acetaminophen (NORCO) 10-325 MG per tablet 2 tablet, 2 tablet, Oral, Q4H PRN, Earnie Larsson, MD, 2 tablet at 01/26/19 0603 .  HYDROcodone-acetaminophen (NORCO/VICODIN) 5-325 MG per tablet 1 tablet, 1 tablet, Oral, Q4H PRN, Earnie Larsson, MD, 1 tablet at 01/28/19 1429 .  HYDROcodone-acetaminophen (NORCO/VICODIN) 5-325 MG per tablet 1-2 tablet, 1-2 tablet, Oral, Q4H PRN, Costella, Vista Mink, PA-C, 2 tablet at 01/28/19 1844 .  megestrol (MEGACE) tablet 40 mg, 40 mg, Oral, BID, Costella, Vincent J, PA-C, 40 mg at 01/29/19 3790 .  menthol-cetylpyridinium (CEPACOL) lozenge 3 mg, 1 lozenge, Oral, PRN **OR** phenol (CHLORASEPTIC) mouth spray 1 spray, 1 spray, Mouth/Throat, PRN, Pool, Henry, MD .  ondansetron (ZOFRAN) tablet 4 mg, 4 mg, Oral, Q6H PRN **OR** ondansetron (ZOFRAN) injection 4 mg, 4 mg, Intravenous, Q6H PRN, Earnie Larsson, MD .  oxyCODONE (Oxy IR/ROXICODONE) immediate release tablet 5-10 mg, 5-10 mg, Oral, Q3H PRN, Costella, Vincent J, PA-C, 10 mg at 01/29/19 0923 .  sodium chloride flush (NS) 0.9 % injection 3 mL, 3 mL, Intravenous, Q12H, Costella,  Vincent J, PA-C, 3 mL at 01/28/19 2212 .  sodium chloride flush (NS) 0.9 % injection 3 mL, 3 mL, Intravenous, PRN, Costella, Vincent J, PA-C .  sodium chloride flush (NS) 0.9 % injection 3 mL, 3 mL, Intravenous, Q12H, Pool, Mallie Mussel, MD, 3 mL at 01/28/19 2212 .  sodium chloride flush (NS) 0.9 % injection 3 mL, 3 mL, Intravenous, PRN, Earnie Larsson, MD .  sulfamethoxazole-trimethoprim (BACTRIM DS,SEPTRA DS) 800-160 MG per tablet 1 tablet, 1 tablet, Oral, Q12H, Earnie Larsson, MD, 1 tablet at 01/29/19 2409  Patients Current Diet:  Diet Order            Diet regular Room service appropriate? Yes; Fluid consistency: Thin  Diet effective now  Precautions / Restrictions Precautions Precautions: Back, Fall Precaution Booklet Issued: Yes (comment) Precaution Comments: obese Restrictions Weight Bearing Restrictions: No   Has the patient had 2 or more falls or a fall with injury in the past year?Yes  Prior Activity Level Limited Community (1-2x/wk): was independent; sedentary; worked. Gradual decline in function over last 2 1/2 months  Development worker, international aid / [place] Devices/Equipment: Wheelchair, Environmental consultant (specify type)(front wheel walker) Home Equipment: None  Prior Device Use: Indicate devices/aids used by the patient prior to current illness, exacerbation or injury? None of the above  Prior Functional Level Prior Function Level of Independence: Independent Comments: decline in function over 2 1/2 months  Self Care: Did the patient need help bathing, dressing, using the toilet or eating?  Independent  Indoor Mobility: Did the patient need assistance with walking from room to room (with or without device)? Independent  Stairs: Did the patient need assistance with internal or external stairs (with or without device)? Independent  Functional Cognition: Did the patient need help planning regular tasks such as shopping or remembering to take medications?  Independent  Current Functional Level Cognition  Overall Cognitive Status: Within Functional Limits for tasks assessed Orientation Level: Oriented X4    Extremity Assessment (includes Sensation/Coordination)  Upper Extremity Assessment: Overall WFL for tasks assessed  Lower Extremity Assessment: Defer to PT evaluation RLE Deficits / Details: 2/5 knee and hip flexion, 2-/5 knee extension, 2-/5 dorsiflexion, PROM limited by body habitus RLE Sensation: decreased light touch(pt able to sense light touch mid calf distally and pressure throughout entire leg) RLE Coordination: decreased gross motor LLE Deficits / Details: no AROM, limited ROM due to body habitus    ADLs  Overall ADL's : Needs assistance/impaired Eating/Feeding: Set up, Sitting Eating/Feeding Details (indicate cue type and reason): supported sitting at bed level Grooming: Set up, Bed level, Wash/dry hands, Wash/dry face, Sitting Grooming Details (indicate cue type and reason): sitting in recliner at end of session Upper Body Bathing: Bed level, Moderate assistance Lower Body Bathing: Total assistance, Bed level, +2 for physical assistance, +2 for safety/equipment Upper Body Dressing : Moderate assistance Lower Body Dressing: Total assistance, +2 for physical assistance, +2 for safety/equipment Toilet Transfer: Total assistance Toilet Transfer Details (indicate cue type and reason): maxi sky used Toileting- Clothing Manipulation and Hygiene: Total assistance Toileting - Clothing Manipulation Details (indicate cue type and reason): currently with flexi seal General ADL Comments: assisted with transition  to vital go tild bed and completing initial tilt, pt tolerating well    Mobility  Overal bed mobility: Needs Assistance Bed Mobility: Rolling Rolling: Max assist, +2 for physical assistance Supine to sit: Total assist, +2 for physical assistance General bed mobility comments: max +3 assist to roll to right with rail, cues  and physical assist to lift and move LLE as well as pad to rotate pelvis. Pt with painful LLE and required mod assist to maintain. Max +2 to roll to left with cues and assist to bend and move RLE. Placed sling with rolling and able to use maxisky to transfer from regular bed to bari total lift bed    Transfers  Transfer via Lift Equipment: [place] transfer comment: +3 assist for lift from bed to vital go tilt bed, use of vital go tilt bed with tilting features this session     Ambulation / Gait / Stairs / Wheelchair Mobility  Ambulation/Gait General Gait Details: unable    Posture / Balance Dynamic Sitting Balance Sitting balance - Comments:  in chair with feet supported on floor pt min assist to use bil UE and lean forward, back support/unsupported with no LOB with bil UE support Balance Overall balance assessment: Needs assistance Sitting-balance support: Bilateral upper extremity supported Sitting balance - Comments: in chair with feet supported on floor pt min assist to use bil UE and lean forward, back support/unsupported with no LOB with bil UE support    Special needs/care consideration BiPAP/CPAP n/a CPM n/a Continuous Drip IV n/a Dialysis n/a Life Vest n/a Oxygen n/a Special Bed tilt bariatric bed Trach Size n/a Wound Vac n/a Skin MASD right leg and bilateral groin area Bowel mgmt: incontinent LBM 2/6. flexiseal unable to be used due to pt size Bladder mgmt: external catheter Diabetic mgmt n/a Maxi sky list use Bariatric equipment   Previous Home Environment Living Arrangements: (lives with sister and her 45 year old daughter)  Lives With: (sister in sister's apartment) Available Help at Discharge: Family, Friend(s), Available 24 hours/day(pt states friends and family can arrange 24/7 assist at d/c) Type of Home: Apartment Home Layout: One level Home Access: Stairs to enter Entrance Stairs-Rails: Right, Left Entrance Stairs-Number of Steps: 14 Bathroom  Shower/Tub: Optometrist: Yes How Accessible: Accessible via walker Home Care Services: No Additional Comments: pt lives with sister and 37 yo daughter  Discharge Living Setting Plans for Discharge Living Setting: Lives with (comment)(patient states she has arranged with her Best friend to go t) Type of Home at Discharge: House Discharge Home Layout: One level Discharge Home Access: Level entry Discharge Bathroom Shower/Tub: Tub/shower unit Discharge Bathroom Accessibility: No Does the patient have any problems obtaining your medications?: No  Social/Family/Support Systems Patient Roles: Parent(employee) Contact Information: Sister, [name] Anticipated Caregiver: friends and family Anticipated Caregiver's Contact Information: see above Ability/Limitations of Caregiver: [name] works; her best freind works but Estate manager/land agent adn pt state they will arrrange friends and family to provide close to 24/7 asisst Caregiver Availability: 24/7 Discharge Plan Discussed with Primary Caregiver: Yes Is Caregiver In Agreement with Plan?: Yes Does Caregiver/Family have Issues with Lodging/Transportation while Pt is in Rehab?: No  Goals/Additional Needs Patient/Family Goal for Rehab: min assist with PT and OT at wheelchair level Expected length of stay: elos 22 TO 27 days Equipment Needs: Maxisky for transfers on admission Pt/Family Agrees to Admission and willing to participate: Yes Program Orientation Provided & Reviewed with Pt/Caregiver Including Roles  & Responsibilities: Yes  Barriers to Discharge: Neurogenic Bowel & Bladder, Insurance for SNF coverage, Weight  Decrease burden of Care through IP rehab admission: n/a  Possible need for SNF placement upon discharge: if pt does not progress to min assist wheelchair level; I discussed with pt that BCBS not guaranteed to pay for both CIR and SNF. Multiple discussions of the need to be at her friend's  home which is one level and wheelchair accessible.  Patient Condition: This patient's medical and functional status has changed since the consult dated: 01/21/2019 in which the Rehabilitation Physician determined and documented that the patient's condition is appropriate for intensive rehabilitative care in an inpatient rehabilitation facility. See "History of Present Illness" (above) for medical update. Functional changes are: max assist with lift used to transfer. Patient's medical and functional status update has been discussed with the Rehabilitation physician and patient remains appropriate for inpatient rehabilitation. Will admit to inpatient rehab today.  Preadmission Screen Completed By:  Cleatrice Burke, 01/29/2019 9:53 AM ______________________________________________________________________   Discussed status with Dr. Naaman Plummer on 01/29/2019 at  1127 and received telephone approval for admission today.  Admission Coordinator:  Cleatrice Burke, time [id number] Date 01/29/2019

## 2019-01-29 NOTE — Care Management Note (Addendum)
Case Management Note  Patient Details  Name: Ellen Hill MRN: [id number] Date of Birth: 07/06/1971  Subjective/Objective: 48 yo morbidly obese female presented to ER with gait instability and bil LE weakness R>L and LBP after fall. Pt with thoracic stenosis with cord compression s/p T9-L1 laminectomies 1/28.  PTA, pt independent, lives at home with sister and 60 yo daughter.                   Action/Plan: PT/OT recommending CIR, and consult in process.  Awaiting further progression with therapies prior to moving forward with insurance auth for CIR.    Expected Discharge Date:  01/29/19               Expected Discharge Plan:  [place]  In-House Referral:     Discharge planning Services  CM Consult  Post Acute Care Choice:    Choice offered to:     DME Arranged:    DME Agency:     HH Arranged:    HH Agency:     Status of Service:  Completed, signed off  If discussed at [employer] [facility] [employer], dates discussed:    Additional Comments:  01/29/19 J. Kailan Carmen, RN, BSN Pt medically stable for discharge to CIR today, and has been accepted for admission to [facility].    Reinaldo Raddle, RN, BSN  Trauma/Neuro ICU Case Manager [phone]

## 2019-01-30 ENCOUNTER — Inpatient Hospital Stay (HOSPITAL_COMMUNITY): Payer: Self-pay | Admitting: Physical Therapy

## 2019-01-30 ENCOUNTER — Inpatient Hospital Stay (HOSPITAL_COMMUNITY): Payer: BC Managed Care – PPO

## 2019-01-30 ENCOUNTER — Inpatient Hospital Stay (HOSPITAL_COMMUNITY): Payer: Self-pay | Admitting: Occupational Therapy

## 2019-01-30 DIAGNOSIS — N319 Neuromuscular dysfunction of bladder, unspecified: Secondary | ICD-10-CM

## 2019-01-30 DIAGNOSIS — B9620 Unspecified Escherichia coli [E. coli] as the cause of diseases classified elsewhere: Secondary | ICD-10-CM

## 2019-01-30 DIAGNOSIS — N390 Urinary tract infection, site not specified: Secondary | ICD-10-CM

## 2019-01-30 DIAGNOSIS — D72829 Elevated white blood cell count, unspecified: Secondary | ICD-10-CM

## 2019-01-30 DIAGNOSIS — M7989 Other specified soft tissue disorders: Secondary | ICD-10-CM

## 2019-01-30 DIAGNOSIS — D638 Anemia in other chronic diseases classified elsewhere: Secondary | ICD-10-CM

## 2019-01-30 DIAGNOSIS — K592 Neurogenic bowel, not elsewhere classified: Secondary | ICD-10-CM

## 2019-01-30 DIAGNOSIS — R609 Edema, unspecified: Secondary | ICD-10-CM

## 2019-01-30 LAB — URINE CULTURE: Culture: >=100000 — AB

## 2019-01-30 MED ORDER — TAMSULOSIN HCL 0.4 MG PO CAPS
0.4000 mg | ORAL_CAPSULE | Freq: Every day | ORAL | Status: DC
  Administered 2019-01-30 – 2019-03-04 (×34): 0.4 mg via ORAL
  Filled 2019-01-30 (×34): qty 1

## 2019-01-30 MED ORDER — ZOLPIDEM TARTRATE 5 MG PO TABS
5.0000 mg | ORAL_TABLET | Freq: Every evening | ORAL | Status: DC | PRN
  Administered 2019-01-30 – 2019-02-02 (×4): 5 mg via ORAL
  Filled 2019-01-30 (×4): qty 1

## 2019-01-30 NOTE — Plan of Care (Signed)
  Problem: Consults Goal: RH SPINAL CORD INJURY PATIENT EDUCATION Description  See Patient Education module for education specifics.  Outcome: Progressing Goal: Skin Care Protocol Initiated - if Braden Score 18 or less Description If consults are not indicated, leave blank or document N/A Outcome: Progressing Goal: Nutrition Consult-if indicated Outcome: Progressing   Problem: SCI BOWEL ELIMINATION Goal: RH STG MANAGE BOWEL WITH ASSISTANCE Description STG Manage Bowel with moderate x2 Assistance.  Outcome: Progressing Goal: RH STG SCI MANAGE BOWEL WITH MEDICATION WITH ASSISTANCE Description STG SCI Manage bowel with medication with min to mod assistance.  Outcome: Progressing Goal: RH STG SCI MANAGE BOWEL PROGRAM W/ASSIST OR AS APPROPRIATE Description STG SCI Manage bowel program with min to mod assist or as appropriate.  Outcome: Progressing   Problem: SCI BLADDER ELIMINATION Goal: RH STG MANAGE BLADDER WITH ASSISTANCE Description STG Manage Bladder With maximum Assistance  Outcome: Progressing Goal: RH STG MANAGE BLADDER WITH EQUIPMENT WITH ASSISTANCE Description STG Manage Bladder With Bladder scanner Equipment With moderate Assistance.  *Bladder scan prior to each void. *PVR after every void.   Outcome: Progressing   Problem: RH SKIN INTEGRITY Goal: RH STG SKIN FREE OF INFECTION/BREAKDOWN Description Skin to remain free from infection and breakdown while on rehab with moderate assistance.  Outcome: Progressing Goal: RH STG MAINTAIN SKIN INTEGRITY WITH ASSISTANCE Description STG Maintain Skin Integrity With min to mod Assistance.  * Use Barrier Cream and MGP on skin folds, buttocks, and groin area PRN.  Outcome: Progressing Goal: RH STG ABLE TO PERFORM INCISION/WOUND CARE W/ASSISTANCE Description STG Able To Perform Incision/Wound Care With min to mod Assistance.  Outcome: Progressing   Problem: RH SAFETY Goal: RH STG ADHERE TO SAFETY PRECAUTIONS  W/ASSISTANCE/DEVICE Description STG Adhere to Safety Precautions With mod to max Assistance and with appropriate safety or assistive Device.  Outcome: Progressing   Problem: RH PAIN MANAGEMENT Goal: RH STG PAIN MANAGED AT OR BELOW PT'S PAIN GOAL Description <3 on a 1-10 pain scale  Outcome: Progressing   Problem: RH KNOWLEDGE DEFICIT SCI Goal: RH STG INCREASE KNOWLEDGE OF SELF CARE AFTER SCI Description Patient will be able to demonstrate how to manage medications, diet, bowel and bladder with min assistance from rehab staff before discharge.  Outcome: Progressing

## 2019-01-30 NOTE — Evaluation (Signed)
Occupational Therapy Assessment and Plan  Patient Details  Name: Ellen Hill MRN: [id number] Date of Birth: [birth year]  OT Diagnosis: acute pain, muscle weakness (generalized), paraplegia at level T9 and sensory loss Rehab Potential: Rehab Potential (ACUTE ONLY): Fair ELOS: 24- 27 days   Today's Date: 01/30/2019 OT Individual Time: 8338-2505 and 1300-1400 OT Individual Time Calculation (min): 72 min     Problem List:  Patient Active Problem List   Diagnosis Date Noted  . Myelopathy ([place]) 01/29/2019  . 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 Lumbar back pain with radiculopathy affecting lower extremity 12/08/2018  . Dysmenorrhea 04/13/2012  . Class 3 severe obesity due to excess calories without serious comorbidity with body mass index (BMI) of 60.0 to 69.9 in adult ([place]) 04/13/2012  . Anemia 04/13/2012  . Fibroids 04/13/2012  . Menorrhagia 03/07/2012    Past Medical History:  Past Medical History:  Diagnosis Date  . Abnormal Pap smear    history  . Anemia   . ASCUS (atypical squamous cells of undetermined significance) on Pap smear 2008   At 36wk of pregnancy ; colpo  . Asthma    rarely uses inhaler (seasonal)  . 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 Fibroids   . H/O candidiasis   . H/O fatigue 08/2007  . H/O varicella   . Headache(784.0)    migraines   . Heart murmur    dx child - no problems as adult  . Hx: UTI (urinary tract infection) 07/29/07  . Increased BMI   . Irregular bleeding   . Migraines   . Obesity 02/13/12  . Pelvic pain 02/13/12  . Postpartum anemia 08/18/07  . 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 after loss of parents, is fine  . Vaginal Pap smear, abnormal    f/u wnl   Past Surgical History:  Past Surgical History:  Procedure Laterality Date  . CERVICAL POLYPECTOMY  05/01/2012   Procedure: CERVICAL POLYPECTOMY;  Surgeon: Betsy Coder, MD;  Location: [facility];  Service: Gynecology;  Laterality: N/A;  . CESAREAN SECTION  07/04/2007  . CHOLECYSTECTOMY   07/06/2013  . CHOLECYSTECTOMY N/A 07/06/2013   Procedure: LAPAROSCOPIC CHOLECYSTECTOMY ;  Surgeon: Gayland Curry, MD;  Location: [facility];  Service: General;  Laterality: N/A;  attempted cholangiogram  . fibroidecto    . fibroidectomy  07/04/2007  . LUMBAR LAMINECTOMY/DECOMPRESSION MICRODISCECTOMY N/A 01/19/2019   Procedure: Thoracic Nine-Lumbar One Decompressive Laminectomy;  Surgeon: Earnie Larsson, MD;  Location: [place];  Service: Neurosurgery;  Laterality: N/A;  posterior  . MYOMECTOMY  07/04/08  . OPERATIVE HYSTEROSCOPY  05/01/2012   no menses since that time  . TUBAL LIGATION  07/04/2007    Assessment & Plan Clinical Impression: Patient is a 48 y.o. year old female with history of migraine headaches, obesity with BMI 64.76. Per chart review patient lives with her sister 58 year old daughter. One level apartment with multiple steps to entry. 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 Sister works during the day. She has a neighbor friend who planned to assist she would stay with on discharge with accessible home. Presented 01/17/2019 with gait instability bilateral lower extremity weakness 2-1/2 months to the point she was essentially nonambulatory. 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 Patient underwent T9, T10, T11-12 and partial L1 decompressive laminotomies 01/19/2019 per Dr. Annette Stable. No back brace required. Surgical PCR screening positive  for contact precautions. Hospital course ongoing pain management. Decadron protocol as indicated. Elevated WBC 20,500 decreased to 17,35moitored closely while on steroid therapy with urine culture completed 01/27/2019 greater 100,000 Proteus and 50,000 Escherichia coli andcurrently maintained on Bactrim. Bouts of diarrhea noted patient initially did have a a rectal tube in place and since has been  removed.Therapy evaluations completed with recommendations of physical medicine rehabilitation consult. Patient was admitted for a comprehensive rehabilitation program .  Patient transferred to CIR on 01/29/2019 .    Patient currently requires total + 2 helpers with basic self-care skills secondary to muscle weakness and muscle paralysis, decreased cardiorespiratoy endurance, sensation loss, safety awareness and decreased sitting balance and decreased balance strategies.  Prior to hospitalization, patient could complete ADLs and IADLs with independent .  Patient will benefit from skilled intervention to decrease level of assist with basic self-care skills prior to discharge home with care partner.  Anticipate patient will require 24 hour supervision, minimal physical assistance and moderate physical assestance and follow up home health.  OT - End of Session Activity Tolerance: Decreased this session Endurance Deficit: Yes Endurance Deficit Description: multiple rest breaks secondary to fatigue OT Assessment Rehab Potential (ACUTE ONLY): Fair OT Barriers to Discharge: Inaccessible home environment;Home environment access/layout OT Patient demonstrates impairments in the following area(s): Balance;Endurance;Motor;Pain;Perception;Safety;Sensory OT Basic ADL's Functional Problem(s): Grooming;Bathing;Dressing;Toileting OT Transfers Functional Problem(s): Toilet OT Additional Impairment(s): None OT Plan OT Intensity: Minimum of 1-2 x/day, 45 to 90 minutes OT Frequency: 5 out of 7 days OT Duration/Estimated Length of Stay: 24- 27 days OT Treatment/Interventions: Balance/vestibular training;Disease mangement/prevention;Self Care/advanced ADL retraining;Therapeutic Exercise;Wheelchair propulsion/positioning;Cognitive remediation/compensation;DME/adaptive equipment instruction;UE/LE Strength taining/ROM;Pain management;Community reintegration;Functional electrical stimulation;Patient/family education;UE/LE  Coordination activities;Discharge planning;Functional mobility training;Psychosocial support;Therapeutic Activities OT Self Feeding Anticipated Outcome(s): n/a OT Basic Self-Care Anticipated Outcome(s): min - mod A OT Toileting Anticipated Outcome(s): mod A OT Bathroom Transfers Anticipated Outcome(s): mod A toileting OT Recommendation Recommendations for Other Services: Neuropsych consult Patient destination: Home Follow Up Recommendations: Home health OT;24 hour supervision/assistance Equipment Recommended: To be determined   Skilled Therapeutic Intervention Session 1: Upon entering the room, pt supine in bed and agreeable to OT intervention. OT educated pt on OT purpose, POC, and goals with pt verbalizing understanding and agreement. OT wrapping B LEs secondary to pt having order for TEDs but due to body habitus wrapping performed instead.  Pt able to briefly prop self on B elbows for 1-2 minutes this session. Pt needing total A +2 for bed mobility and supine >sit to EOB. Pt able to prop self up for less than 1 minute at EOB and needing to return to supine secondary to increased pain. Pt required +2 assist to return to supine and repositioning in bed. Pt was very excited since she has never sat EOB since PTA. Pt remaining in bed with call bell and all needed items within reach upon exiting the room.   Session 2: Upon entering the room, pt supine in bed with no c/o pain this session. Skilled OT intervention with focus on B UE strengthening with use 4 lb resistive dowel rod and focus on keeping weight close to body. Pt performed 3 sets of 10 chest presses, forward rows, backwards rows, straight arm raises, and bicep curls. Pt needing multiple rest breaks secondary to fatigue this session. Pt needing min verbal cuing for proper technique and hand placement. Pt also with several questions regarding bowel and bladder. OT answering but also encouraged pt ask physician and RN these questions as well. Pt  remained in bed with call bell and all needed items within reach upon exiting the room.   OT Evaluation Precautions/Restrictions  Precautions Precautions: Back;Fall Precaution Comments: obese. No brace needed Restrictions Weight Bearing Restrictions: No Vital Signs Therapy Vitals Temp: 98.3 F (36.8 C) Temp Source: Oral Pulse Rate: (!) 102 Resp: 18 BP: (!) 131/96 Patient Position (if appropriate): Lying Oxygen Therapy SpO2: 100 % O2 Device: Room Air Pain Pain Assessment Pain Scale: 0-10 Pain Score: 10-Worst pain ever Pain Type: Acute pain Pain Location: Abdomen Pain Orientation: Lower Pain Descriptors / Indicators: Aching;Dull Pain Onset: On-going Patients Stated Pain Goal: 4 Pain Intervention(s): RN made aware;Repositioned Multiple Pain Sites: No Home Living/Prior Functioning Home Living Available Help at Discharge: Family, Friend(s), Available 24 hours/day Type of Home: Apartment Home Access: Stairs to enter Technical brewer of Steps: 14 Entrance Stairs-Rails: Right, Left Home Layout: One level Bathroom Shower/Tub: Government social research officer Accessibility: Yes Additional Comments: pt lives with sister and 66 yo daughter  Lives With: Family(lives with sister and her 34 y/o daughter) IADL History Occupation: Full time employment Prior Function Level of Independence: Independent with basic ADLs, Independent with homemaking with ambulation, Independent with gait, Independent with transfers  Able to Take Stairs?: Yes Driving: Yes Vocation: Full time employment Comments: decline in function over 2 1/2 months Vision Baseline Vision/History: No visual deficits Patient Visual Report: No change from baseline Vision Assessment?: No apparent visual deficits Cognition Overall Cognitive Status: Within Functional Limits for tasks assessed Arousal/Alertness: Awake/alert Orientation Level: Person;Place;Situation Person: Oriented Place:  Oriented Situation: Oriented Year: 2020 Month: February Day of Week: Correct Memory: Appears intact Immediate Memory Recall: Sock;Blue;Bed Memory Recall: Sock;Blue;Bed Memory Recall Sock: Without Cue Memory Recall Blue: Without Cue Memory Recall Bed: Without Cue Sensation Sensation Light Touch: Appears Intact(B UEs) Light Touch Impaired Details: Impaired LLE Coordination Gross Motor Movements are Fluid and Coordinated: No Fine Motor Movements are Fluid and Coordinated: Yes Motor  Motor Motor: Paraplegia;Abnormal tone;Abnormal postural alignment and control Motor - Skilled Clinical Observations: incomplete motor paraplegia below T9 Mobility  Bed Mobility Bed Mobility: Rolling Right;Rolling Left Rolling Right: 2 Helpers Rolling Left: 2 Helpers  Trunk/Postural Assessment  Cervical Assessment Cervical Assessment: Within Functional Limits Thoracic Assessment Thoracic Assessment: Exceptions to [facility] Lumbar Assessment Lumbar Assessment: Exceptions to [facility] Postural Control Postural Control: Deficits on evaluation  Balance Balance Balance Assessed: Yes Static Sitting Balance Static Sitting - Level of Assistance: 2: Max assist Extremity/Trunk Assessment RUE Assessment RUE Assessment: Within Functional Limits LUE Assessment LUE Assessment: Within Functional Limits     Refer to Care Plan for Long Term Goals  Recommendations for other services: Neuropsych   Discharge Criteria: Patient will be discharged from OT if patient refuses treatment 3 consecutive times without medical reason, if treatment goals not met, if there is a change in medical status, if patient makes no progress towards goals or if patient is discharged from hospital.  The above assessment, treatment plan, treatment alternatives and goals were discussed and mutually agreed upon: by patient  [name] 01/30/2019, 8:41 AM

## 2019-01-30 NOTE — Progress Notes (Signed)
[facility] PHYSICAL MEDICINE & REHABILITATION PROGRESS NOTE  Subjective/Complaints: Patient seen laying in bed this morning.  She states she slept fairly overnight due to trouble adjusting to her new bed.  States she was only able to tolerate 40 degrees elevation on standing frame for her legs "gave out" discussed with therapies limited previous participation.  ROS: Denies CP, shortness of breath, nausea, vomiting, diarrhea.  Objective: Vital Signs: Blood pressure (!) 131/96, pulse (!) 102, temperature 98.3 F (36.8 C), temperature source Oral, resp. rate 18, height 5\' 6"  (1.676 m), SpO2 100 %. No results found. Recent Labs    01/27/19 1150 01/28/19 1210  WBC 20.5* 17.7*  HGB 11.1* 11.0*  HCT 35.1* 36.7  PLT 180 182   No results for input(s): NA, K, CL, CO2, GLUCOSE, BUN, CREATININE, CALCIUM in the last 72 hours.  Physical Exam: BP (!) 131/96 (BP Location: Right Wrist)   Pulse (!) 102   Temp 98.3 F (36.8 C) (Oral)   Resp 18   Ht 5\' 6"  (1.676 m)   SpO2 100%   BMI 64.76 kg/m  Constitutional: No distress . Vital signs reviewed.  Super obese. HENT: Normocephalic.  Atraumatic. Eyes: EOMI. No discharge. Cardiovascular: + Tachycardia.  Regular rhythm. No JVD. Respiratory: CTA Bilaterally. Normal effort. GI: BS +. Non-distended. Musc: No edema or tenderness in extremities. Neurological: She isalert and oriented. Follows full commands.  Motor: Bilateral UE 5/5.  RLE HF, KE 2/5, 3/5 ADF.  LLE: 0/5 prox to distal.  Skin: Skin iswarm.  Psychiatric: She has anormal mood and affect. Herbehavior is normal.Judgmentand thought contentnormal.   Assessment/Plan: 1. 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  Physiatrist is providing close team supervision and 24 hour management of active medical problems listed below.  Physiatrist and  rehab team continue to assess barriers to discharge/monitor patient progress toward functional and medical goals  Care Tool:  Bathing    Body parts bathed by patient: Right arm, Left arm, Chest, Abdomen, Face   Body parts bathed by helper: Front perineal area, Buttocks, Right upper leg, Left upper leg, Right lower leg, Left lower leg     Bathing assist Assist Level: Maximal Assistance - Patient 24 - 49%     Upper Body Dressing/Undressing Upper body dressing   What is the patient wearing?: Hospital gown only    Upper body assist Assist Level: Set up assist    Lower Body Dressing/Undressing Lower body dressing      What is the patient wearing?: Hospital gown only     Lower body assist Assist for lower body dressing: Maximal Assistance - Patient 25 - 49%     Toileting Toileting    Toileting assist Assist for toileting: Total Assistance - Patient < 25%     Transfers Chair/bed transfer  Transfers assist     Chair/bed transfer assist level: Dependent - mechanical lift     Locomotion Ambulation   Ambulation assist              Walk 10 feet activity   Assist           Walk 50 feet activity   Assist           Walk 150 feet activity   Assist           Walk 10 feet on uneven surface  activity   Assist  Wheelchair     Assist               Wheelchair 50 feet with 2 turns activity    Assist            Wheelchair 150 feet activity     Assist            Medical Problem List and Plan: 1.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 No back brace required  Begin CIR  Notes reviewed decompressive laminectomies in morbidly obese 2. DVT Prophylaxis/Anticoagulation: SCDs.   Vascular study pending 3. Pain Management:Neurontin 300 mg 3 times a day, Flexeril 10 mg every 8 hours and when necessary,  hydrocodone as needed 4. Mood:Provide emotional support 5. Neuropsych: This patientiscapable of making decisions on herown behalf. 6. Skin/Wound Care:Routine skin checks 7. Fluids/Electrolytes/Nutrition:Routine ins and outs  BMP within acceptable range on 1/26, labs ordered for Monday 8.Proteus/Escherichia coli UTI.   Bactrim started on 01/27/2019, both sensitive 9.  Super obesity. Latest BMI 64.79. Dietary follow-up 10. Neurogenic bowel and bladder. Establish bowel program. Provide education.  PVRs showing retention  Flomax started on 2/8 11.  Leukocytosis  WBCs 17.7 on 2/6  Labs ordered for Monday  Afebrile  Cont to monitor 12.?  Anemia of chronic disease  Hemoglobin 11.0 on 2/6  Labs ordered for Monday  LOS: 1 days A FACE TO FACE EVALUATION WAS PERFORMED  Luismario Coston Lorie Phenix 01/30/2019, 9:04 AM

## 2019-01-30 NOTE — Progress Notes (Signed)
Lower extremity venous has been completed.   Preliminary results in CV Proc.   Ellen Hill 01/30/2019 3:24 PM

## 2019-01-30 NOTE — Evaluation (Signed)
Physical Therapy Assessment and Plan  Patient Details  Name: Ellen Hill MRN: [id number] Date of Birth: July 07, 1971  PT Diagnosis: Impaired sensation, Muscle weakness and Paraplegia Rehab Potential: Fair ELOS: 24-27 days   Today's Date: 01/30/2019 PT Individual Time: 1000-1100 PT Individual Time Calculation (min): 60 min    Problem List:  Patient Active Problem List   Diagnosis Date Noted  . Anemia of chronic disease   . Leukocytosis   . Neurogenic bowel   . Neurogenic bladder   . E. coli UTI   . Myelopathy ([place]) 01/29/2019  . 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 Lumbar back pain with radiculopathy affecting lower extremity 12/08/2018  . Dysmenorrhea 04/13/2012  . Class 3 severe obesity due to excess calories without serious comorbidity with body mass index (BMI) of 60.0 to 69.9 in adult ([place]) 04/13/2012  . Anemia 04/13/2012  . Fibroids 04/13/2012  . Menorrhagia 03/07/2012    Past Medical History:  Past Medical History:  Diagnosis Date  . Abnormal Pap smear    history  . Anemia   . ASCUS (atypical squamous cells of undetermined significance) on Pap smear 2008   At 36wk of pregnancy ; colpo  . Asthma    rarely uses inhaler (seasonal)  . 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 Fibroids   . H/O candidiasis   . H/O fatigue 08/2007  . H/O varicella   . Headache(784.0)    migraines   . Heart murmur    dx child - no problems as adult  . Hx: UTI (urinary tract infection) 07/29/07  . Increased BMI   . Irregular bleeding   . Migraines   . Obesity 02/13/12  . Pelvic pain 02/13/12  . Postpartum anemia 08/18/07  . 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 after loss of parents, is fine  . Vaginal Pap smear, abnormal    f/u wnl   Past Surgical History:  Past Surgical History:  Procedure Laterality Date  . CERVICAL POLYPECTOMY  05/01/2012   Procedure: CERVICAL POLYPECTOMY;  Surgeon: Betsy Coder, MD;  Location: [facility];  Service: Gynecology;  Laterality: N/A;  . CESAREAN SECTION   07/04/2007  . CHOLECYSTECTOMY  07/06/2013  . CHOLECYSTECTOMY N/A 07/06/2013   Procedure: LAPAROSCOPIC CHOLECYSTECTOMY ;  Surgeon: Gayland Curry, MD;  Location: [facility];  Service: General;  Laterality: N/A;  attempted cholangiogram  . fibroidecto    . fibroidectomy  07/04/2007  . LUMBAR LAMINECTOMY/DECOMPRESSION MICRODISCECTOMY N/A 01/19/2019   Procedure: Thoracic Nine-Lumbar One Decompressive Laminectomy;  Surgeon: Earnie Larsson, MD;  Location: [facility];  Service: Neurosurgery;  Laterality: N/A;  posterior  . MYOMECTOMY  07/04/08  . OPERATIVE HYSTEROSCOPY  05/01/2012   no menses since that time  . TUBAL LIGATION  07/04/2007    Assessment & Plan Clinical Impression:  Ellen Hill is a 48 year old right-handed female with history of migraine headaches, obesity with BMI 64.76. Per chart review patient lives with her sister 65 year old daughter. One level apartment with multiple steps to entry. 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 Sister works during the day. She has a neighbor friend who planned to assist she would stay with on discharge with accessible home. Presented 01/17/2019 with gait instability bilateral lower extremity weakness 2-1/2 months to the point she was essentially nonambulatory. 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 Patient underwent T9, T10, T11-12 and partial L1  decompressive laminotomies 01/19/2019 per Dr. Annette Stable. No back brace required. Surgical PCR screening positive for contact precautions. Hospital course ongoing pain management. Decadron protocol as indicated. Elevated WBC 20,500 decreased to 17,62moitored closely while on steroid therapy with urine culture completed 01/27/2019 greater 100,000 Proteus and 50,000 Escherichia coli andcurrently maintained on Bactrim. Bouts of diarrhea noted patient initially  did have a a rectal tube in place and since has been removed.Therapy evaluations completed with recommendations of physical medicine rehabilitation consult. Patient was admitted for a comprehensive rehabilitation program. Patient transferred to CIR on 01/29/2019 .   Patient currently requires total with mobility secondary to muscle weakness, abnormal tone, unbalanced muscle activation and decreased coordination and decreased sitting balance, decreased postural control and decreased balance strategies.  Prior to hospitalization, patient was modified independent  with mobility and lived with Family(sister and 141yo dtr) in a [place]home.  Home access is 14Stairs to enter.  Patient will benefit from skilled PT intervention to maximize safe functional mobility, minimize fall risk and decrease caregiver burden for planned discharge home with 24 hour assist.  Anticipate [facility]  PT - End of Session Activity Tolerance: Tolerates 30+ min activity with multiple rests Endurance Deficit: Yes Endurance Deficit Description: multiple rest breaks secondary to fatigue PT Assessment Rehab Potential (ACUTE/IP ONLY): Fair PT Barriers to Discharge: IMiamihome environment;Decreased caregiver support;Medical stability;Home environment access/layout;Incontinence;Neurogenic Bowel & Bladder;Weight PT Patient demonstrates impairments in the following area(s): Balance;Endurance;Motor;Perception;Safety;Pain;Sensory;Skin Integrity PT Transfers Functional Problem(s): Bed Mobility;Bed to Chair;Car;Furniture;Floor PT Locomotion Functional Problem(s): Ambulation;Wheelchair Mobility;Stairs PT Plan PT Intensity: Minimum of 1-2 x/day ,45 to 90 minutes PT Frequency: 5 out of 7 days PT Duration Estimated Length of Stay: 24-27 days PT Treatment/Interventions: Balance/vestibular training;Community reintegration;Discharge planning;Disease management/prevention;DME/adaptive equipment  instruction;Functional mobility training;Functional electrical stimulation;Neuromuscular re-education;Pain management;Patient/family education;Psychosocial support;Skin care/wound management;Splinting/orthotics;Therapeutic Activities;Therapeutic Exercise;UE/LE Strength taining/ROM;UE/LE Coordination activities;Wheelchair propulsion/positioning PT Transfers Anticipated Outcome(s): min A PT Locomotion Anticipated Outcome(s): min A at w/c level PT Recommendation Recommendations for Other Services: Neuropsych consult;Therapeutic Recreation consult Therapeutic Recreation Interventions: Stress management;Outing/community reintergration Follow Up Recommendations: Home health PT;24 hour supervision/assistance Patient destination: Home Equipment Recommended: Wheelchair (measurements);Wheelchair cushion (measurements);To be determined Equipment Details: w/c type and size TBD pending progress  Skilled Therapeutic Intervention Evaluation completed (see details above and below) with education on PT POC and goals and individual treatment initiated with focus on functional transfer assessment. Pt received seated in bed, agreeable to PT eval. No complaints of pain. Rolling L/R with max A x 2, use of bedrails, skilled cueing for UE and LE placement during transfer. Dependent for placement of maxisky sling. Maxisky transfer from bed to bari-chair. Pt reclined in bari-chair, BP 107/74 and pt asymptomatic. Pt left seated in barichair with call button in reach, RN informed that pt left up in chair.  PT Evaluation Precautions/Restrictions Precautions Precautions: Back;Fall Restrictions Weight Bearing Restrictions: No Home Living/Prior Functioning Home Living Available Help at Discharge: Family;Friend(s);Available 24 hours/day Type of Home: Apartment Home Access: Stairs to enter Entrance Stairs-Number of Steps: 14 Entrance Stairs-Rails: Right;Left Home Layout: One level  Lives With: Family(sister and 168yo  dtr) Prior Function Level of Independence: Independent with gait;Independent with transfers;Requires assistive device for independence  Able to Take Stairs?: Yes Driving: Yes Vocation: Full time employment Vocation Requirements: works for the state/[employer] Comments: decline in function over 2 1/2 months Vision/Perception  Perception Perception: Within Functional Limits Praxis Praxis: Intact  Cognition Overall Cognitive Status: Within Functional Limits for tasks assessed Arousal/Alertness: Awake/alert Orientation  Level: Oriented X4 Attention: Sustained Sustained Attention: Appears intact Memory: Appears intact Awareness: Appears intact Problem Solving: Appears intact Safety/Judgment: Appears intact Sensation Sensation Light Touch: Impaired Detail Central sensation comments: impaired T9 and below Light Touch Impaired Details: Impaired RLE;Impaired LLE(L impairment > R) Proprioception: Impaired Detail Proprioception Impaired Details: Impaired RLE;Impaired LLE Coordination Gross Motor Movements are Fluid and Coordinated: No Fine Motor Movements are Fluid and Coordinated: Yes Coordination and Movement Description: impaired by weakness and paraplegia Heel Shin Test: unable to assess 2/2 paraplegia Motor  Motor Motor: Paraplegia;Abnormal tone;Abnormal postural alignment and control Motor - Skilled Clinical Observations: incomplete motor paraplegia below T9  Mobility Bed Mobility Bed Mobility: Rolling Right;Rolling Left Rolling Right: Maximal Assistance - Patient 25-49% Rolling Left: Maximal Assistance - Patient 25-49% Transfers Transfers: Transfer via Public house manager (Assistive device): Other (Comment) Transfer via Lift Equipment: Film/video editor / Additional Locomotion Stairs: No Architect: No  Trunk/Postural Assessment  Cervical Assessment Cervical Assessment: Within Scientist, physiological  Assessment: Exceptions to WFL(limited by incomplete paraplegia) Lumbar Assessment Lumbar Assessment: Exceptions to WFL(limited by incomplete paraplegia) Postural Control Postural Control: Deficits on evaluation Trunk Control: impaired sitting balance Postural Limitations: impaired  Balance Balance Balance Assessed: Yes Static Sitting Balance Static Sitting - Balance Support: Bilateral upper extremity supported;Feet supported Static Sitting - Level of Assistance: 2: Max assist Dynamic Sitting Balance Dynamic Sitting - Balance Support: Bilateral upper extremity supported;Feet unsupported;During functional activity Dynamic Sitting - Level of Assistance: 2: Max assist Extremity Assessment  RUE Assessment RUE Assessment: Within Functional Limits LUE Assessment LUE Assessment: Within Functional Limits RLE Assessment RLE Assessment: Exceptions to [facility] Passive Range of Motion (PROM) Comments: limited due to body habitus General Strength Comments: see below RLE Strength Right Hip Flexion: 1/5 Right Knee Flexion: 2/5 Right Knee Extension: 2/5 Right Ankle Dorsiflexion: 2/5 LLE Assessment LLE Assessment: Exceptions to [facility] Passive Range of Motion (PROM) Comments: limited due to body habitus General Strength Comments: see below LLE Strength Left Hip Flexion: 0/5 Left Knee Flexion: 0/5 Left Knee Extension: 0/5 Left Ankle Dorsiflexion: 0/5    Refer to Care Plan for Long Term Goals  Recommendations for other services: Neuropsych and Therapeutic Recreation  Stress management and Outing/community reintegration  Discharge Criteria: Patient will be discharged from PT if patient refuses treatment 3 consecutive times without medical reason, if treatment goals not met, if there is a change in medical status, if patient makes no progress towards goals or if patient is discharged from hospital.  The above assessment, treatment plan, treatment alternatives and goals were discussed and mutually  agreed upon: by patient   Excell Seltzer, PT, DPT 01/30/2019, 4:13 PM

## 2019-01-31 ENCOUNTER — Inpatient Hospital Stay (HOSPITAL_COMMUNITY): Payer: Self-pay

## 2019-01-31 MED ORDER — POLYETHYLENE GLYCOL 3350 17 G PO PACK
17.0000 g | PACK | Freq: Every day | ORAL | Status: DC
  Administered 2019-02-01: 17 g via ORAL
  Filled 2019-01-31 (×2): qty 1

## 2019-01-31 NOTE — Progress Notes (Signed)
Patient reported pain this morning when after suppository and unable to go on bedpan. Patient placed on her side where she was able to have a medium soft formed stool. Patient reported some relief with this. No stool felt in rectal vault. Patient will need evaluation from therapy to see if a BSC option would be safe.

## 2019-01-31 NOTE — Plan of Care (Signed)
  Problem: Consults Goal: RH SPINAL CORD INJURY PATIENT EDUCATION Description  See Patient Education module for education specifics.  Outcome: Progressing Goal: Skin Care Protocol Initiated - if Braden Score 18 or less Description If consults are not indicated, leave blank or document N/A Outcome: Progressing Goal: Nutrition Consult-if indicated Outcome: Progressing   Problem: SCI BLADDER ELIMINATION Goal: RH STG MANAGE BLADDER WITH ASSISTANCE Description STG Manage Bladder With maximum Assistance  Outcome: Progressing Goal: RH STG MANAGE BLADDER WITH EQUIPMENT WITH ASSISTANCE Description STG Manage Bladder With Bladder scanner Equipment With moderate Assistance.  *Bladder scan prior to each void. *PVR after every void.   Outcome: Progressing   Problem: RH SKIN INTEGRITY Goal: RH STG SKIN FREE OF INFECTION/BREAKDOWN Description Skin to remain free from infection and breakdown while on rehab with moderate assistance.  Outcome: Progressing Goal: RH STG MAINTAIN SKIN INTEGRITY WITH ASSISTANCE Description STG Maintain Skin Integrity With min to mod Assistance.  * Use Barrier Cream and MGP on skin folds, buttocks, and groin area PRN.  Outcome: Progressing Goal: RH STG ABLE TO PERFORM INCISION/WOUND CARE W/ASSISTANCE Description STG Able To Perform Incision/Wound Care With min to mod Assistance.  Outcome: Progressing   Problem: RH SAFETY Goal: RH STG ADHERE TO SAFETY PRECAUTIONS W/ASSISTANCE/DEVICE Description STG Adhere to Safety Precautions With mod to max Assistance and with appropriate safety or assistive Device.  Outcome: Progressing   Problem: RH PAIN MANAGEMENT Goal: RH STG PAIN MANAGED AT OR BELOW PT'S PAIN GOAL Description <3 on a 1-10 pain scale  Outcome: Progressing   Problem: RH KNOWLEDGE DEFICIT SCI Goal: RH STG INCREASE KNOWLEDGE OF SELF CARE AFTER SCI Description Patient will be able to demonstrate how to manage medications, diet, bowel and bladder  with min assistance from rehab staff before discharge.  Outcome: Progressing   Problem: SCI BOWEL ELIMINATION Goal: RH STG MANAGE BOWEL WITH ASSISTANCE Description STG Manage Bowel with moderate x2 Assistance.  Outcome: Not Progressing Goal: RH STG SCI MANAGE BOWEL WITH MEDICATION WITH ASSISTANCE Description STG SCI Manage bowel with medication with min to mod assistance.  Outcome: Not Progressing Goal: RH STG SCI MANAGE BOWEL PROGRAM W/ASSIST OR AS APPROPRIATE Description STG SCI Manage bowel program with min to mod assist or as appropriate.  Outcome: Not Progressing

## 2019-01-31 NOTE — Progress Notes (Signed)
[facility] PHYSICAL MEDICINE & REHABILITATION PROGRESS NOTE  Subjective/Complaints: Patient seen laying in bed this morning.  She states she slept well overnight.  She states she had a good first day of therapies yesterday.  She notes that she is constipated.  ROS: + Constipation.  Denies CP, shortness of breath, nausea, vomiting, diarrhea.  Objective: Vital Signs: Blood pressure (!) 146/89, pulse (!) 103, temperature 98.7 F (37.1 C), temperature source Oral, resp. rate 17, height 5\' 6"  (1.676 m), SpO2 100 %. Vas [place] Lower Extremity Venous (dvt)  Result Date: 01/30/2019  Lower Venous Study Indications: Swelling, and Edema.  Limitations: Body habitus, poor ultrasound/tissue interface and bandages. Performing Technologist: Abram Sander RVS  Examination Guidelines: A complete evaluation includes B-mode imaging, spectral Doppler, color Doppler, and power Doppler as needed of all accessible portions of each vessel. Bilateral testing is considered an integral part of a complete examination. Limited examinations for reoccurring indications may be performed as noted.  Right Venous Findings: +---------+---------------+---------+-----------+----------+--------------+          CompressibilityPhasicitySpontaneityPropertiesSummary        +---------+---------------+---------+-----------+----------+--------------+ CFV      Full           Yes      Yes                                 +---------+---------------+---------+-----------+----------+--------------+ SFJ      Full                                                        +---------+---------------+---------+-----------+----------+--------------+ FV Prox  Full                                                        +---------+---------------+---------+-----------+----------+--------------+ FV Mid   Full                                                        +---------+---------------+---------+-----------+----------+--------------+  FV Distal                                             Not visualized +---------+---------------+---------+-----------+----------+--------------+ PFV      Full                                                        +---------+---------------+---------+-----------+----------+--------------+ POP      Full           Yes      Yes                  limited        +---------+---------------+---------+-----------+----------+--------------+ PTV  Not visualized +---------+---------------+---------+-----------+----------+--------------+ PERO                                                  Not visualized +---------+---------------+---------+-----------+----------+--------------+  Left Venous Findings: +---------+---------------+---------+-----------+----------+--------------+          CompressibilityPhasicitySpontaneityPropertiesSummary        +---------+---------------+---------+-----------+----------+--------------+ CFV      Full           Yes      Yes                                 +---------+---------------+---------+-----------+----------+--------------+ SFJ      Full                                                        +---------+---------------+---------+-----------+----------+--------------+ FV Prox  Full                                                        +---------+---------------+---------+-----------+----------+--------------+ FV Mid   Full                                                        +---------+---------------+---------+-----------+----------+--------------+ FV Distal                                             Not visualized +---------+---------------+---------+-----------+----------+--------------+ PFV      Full                                                        +---------+---------------+---------+-----------+----------+--------------+ POP      Full            Yes      Yes                  limited        +---------+---------------+---------+-----------+----------+--------------+ PTV                                                   Not visualized +---------+---------------+---------+-----------+----------+--------------+ PERO                                                  Not visualized +---------+---------------+---------+-----------+----------+--------------+    Summary: Right: There is no evidence of  deep vein thrombosis in the lower extremity. However, portions of this examination were limited- see technologist comments above. No cystic structure found in the popliteal fossa. Left: There is no evidence of deep vein thrombosis in the lower extremity. However, portions of this examination were limited- see technologist comments above. No cystic structure found in the popliteal fossa.  *See table(s) above for measurements and observations. Electronically signed by Monica Martinez MD on 01/30/2019 at 5:51:37 PM.    Final    No results for input(s): WBC, HGB, HCT, PLT in the last 72 hours. No results for input(s): NA, K, CL, CO2, GLUCOSE, BUN, CREATININE, CALCIUM in the last 72 hours.  Physical Exam: BP (!) 146/89 (BP Location: Right Wrist)   Pulse (!) 103   Temp 98.7 F (37.1 C) (Oral)   Resp 17   Ht 5\' 6"  (1.676 m)   SpO2 100%   BMI 64.76 kg/m  Constitutional: No distress . Vital signs reviewed.  Super obese. HENT: Normocephalic.  Atraumatic. Eyes: EOMI. No discharge. Cardiovascular: + Tachycardia.  Regular rhythm.  No JVD. Respiratory: CTA bilaterally.  Normal effort. GI: BS +. Non-distended. Musc: No edema or tenderness in extremities. Neurological: She isalert and oriented. Follows full commands.  Motor: Bilateral UE 5/5.  RLE HF, KE 2/5, 3/5 ADF, stable.  LLE: 0/5 prox to distal, stable.  Skin: Skin iswarm.  Psychiatric: She has anormal mood and affect. Herbehavior is normal.Judgmentand thought contentnormal.    Assessment/Plan: 1. 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  Physiatrist is providing close team supervision and 24 hour management of active medical problems listed below.  Physiatrist and rehab team continue to assess barriers to discharge/monitor patient progress toward functional and medical goals  Care Tool:  Bathing    Body parts bathed by patient: Right arm, Left arm, Chest, Abdomen, Face   Body parts bathed by helper: Front perineal area, Buttocks, Right upper leg, Left upper leg, Right lower leg, Left lower leg     Bathing assist Assist Level: Maximal Assistance - Patient 24 - 49%     Upper Body Dressing/Undressing Upper body dressing   What is the patient wearing?: Hospital gown only    Upper body assist Assist Level: Set up assist    Lower Body Dressing/Undressing Lower body dressing      What is the patient wearing?: Incontinence brief     Lower body assist Assist for lower body dressing: 2 Helpers     Toileting Toileting    Toileting assist Assist for toileting: 2 Helpers     Transfers Chair/bed transfer  Transfers assist     Chair/bed transfer assist level: Dependent - mechanical lift     Locomotion Ambulation   Ambulation assist   Ambulation activity did not occur: Safety/medical concerns          Walk 10 feet activity   Assist  Walk 10 feet activity did not occur: Safety/medical concerns        Walk 50 feet activity   Assist Walk 50 feet with 2 turns activity did not occur: Safety/medical concerns         Walk 150 feet activity   Assist Walk 150 feet activity did not occur: Safety/medical concerns         Walk 10 feet on uneven surface  activity   Assist Walk 10 feet on uneven surfaces activity did not occur: Safety/medical concerns  Wheelchair     Assist Will patient use wheelchair at discharge?: Yes Type of Wheelchair: (TBD) Wheelchair activity did not occur: Safety/medical concerns         Wheelchair 50 feet with 2 turns activity    Assist    Wheelchair 50 feet with 2 turns activity did not occur: Safety/medical concerns       Wheelchair 150 feet activity     Assist Wheelchair 150 feet activity did not occur: Safety/medical concerns          Medical Problem List and Plan: 1.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 No back brace required  Continue CIR 2. DVT Prophylaxis/Anticoagulation: SCDs.   Vascular study limited, but negative 3. Pain Management:Neurontin 300 mg 3 times a day, Flexeril 10 mg every 8 hours and when necessary, hydrocodone as needed 4. Mood:Provide emotional support 5. Neuropsych: This patientiscapable of making decisions on herown behalf. 6. Skin/Wound Care:Routine skin checks 7. Fluids/Electrolytes/Nutrition:Routine ins and outs  BMP within acceptable range on 1/26, labs ordered for tomorrow 8.Proteus/Escherichia coli UTI.   Bactrim started on 01/27/2019, both sensitive 9.  Super obesity. Latest BMI 64.79. Dietary follow-up 10. Neurogenic bowel and bladder. Establish bowel program. Provide education.  PVRs showing retention  Flomax started on 2/8  Bowel meds increased on 2/9 11.  Leukocytosis  WBCs 17.7 on 2/6  Labs ordered for tomorrow  Afebrile  Cont to monitor 12.?  Anemia of chronic disease  Hemoglobin 11.0 on 2/6  Labs ordered for tomorrow  LOS: 2 days A FACE TO FACE EVALUATION WAS PERFORMED  Ankit Lorie Phenix 01/31/2019, 9:26 PM

## 2019-01-31 NOTE — Progress Notes (Signed)
Physical Therapy Session Note  Patient Details  Name: Ellen Hill MRN: [id number] Date of Birth: Sep 24, 1971  Today's Date: 01/31/2019 PT Individual Time: 0930-1030 PT Individual Time Calculation (min): 60 min   Short Term Goals: Week 1:  PT Short Term Goal 1 (Week 1): Pt will perform rolling in bed with assist x 1 consistently PT Short Term Goal 2 (Week 1): Pt will tolerate sitting EOB x 10 min PT Short Term Goal 3 (Week 1): Pt will initiate w/c mobility  Skilled Therapeutic Interventions/Progress Updates:    Pt supine in bed upon PT arrival, agreeable to therapy tx and denies pain. Therapist doned ace wraps to B LEs total assist, donned socks. Pt performed rolling in each direction with max assist while donning lift sling. BP 133/91 in supine. Pt transferred to beri chair this session dependently using the maxisky. BP in sitting 130/95. Pt transported to the gym in beri chair total assist. Pt transferred beri chair<>mat this session using maxi ski, dependent transfer +2 assist. Pt sat edge of mat 15 minutes total, BP remained WFL. Pt worked on sitting balance this session with and without UE support, static sitting. 4 inch steps used under pt's feet for improved LE support while sitting on mat. Pt able to maintain static sitting balance with UE support and supervision bouts of 20-40 sec, therapist providing support posteriorly intermittently secondary to fatigue. Pt performed static sitting without UE support bouts of 5-10 sec with CGA-min assist. Pt transferred back to beri-chair and transported back to room, left with needs in reach and chair alarm set.   Therapy Documentation Precautions:  Precautions Precautions: Back, Fall Precaution Comments: obese. No brace needed Restrictions Weight Bearing Restrictions: No    Therapy/Group: Individual Therapy  Netta Corrigan, PT, DPT 01/31/2019, 8:27 AM

## 2019-02-01 ENCOUNTER — Inpatient Hospital Stay (HOSPITAL_COMMUNITY): Payer: Self-pay | Admitting: Occupational Therapy

## 2019-02-01 ENCOUNTER — Inpatient Hospital Stay (HOSPITAL_COMMUNITY): Payer: Self-pay | Admitting: Physical Therapy

## 2019-02-01 DIAGNOSIS — D72823 Leukemoid reaction: Secondary | ICD-10-CM

## 2019-02-01 LAB — COMPREHENSIVE METABOLIC PANEL
ALT: 366 U/L — ABNORMAL HIGH (ref 0–44)
AST: 113 U/L — ABNORMAL HIGH (ref 15–41)
Albumin: 3.1 g/dL — ABNORMAL LOW (ref 3.5–5.0)
Alkaline Phosphatase: 74 U/L (ref 38–126)
Anion gap: 9 (ref 5–15)
BILIRUBIN TOTAL: 1.1 mg/dL (ref 0.3–1.2)
BUN: 15 mg/dL (ref 6–20)
CO2: 20 mmol/L — ABNORMAL LOW (ref 22–32)
Calcium: 8.6 mg/dL — ABNORMAL LOW (ref 8.9–10.3)
Chloride: 105 mmol/L (ref 98–111)
Creatinine, Ser: 0.69 mg/dL (ref 0.44–1.00)
GFR calc Af Amer: >60 mL/min (ref >60)
GFR calc non Af Amer: >60 mL/min (ref >60)
Glucose, Bld: 131 mg/dL — ABNORMAL HIGH (ref 70–99)
Potassium: 4.7 mmol/L (ref 3.5–5.1)
Sodium: 134 mmol/L — ABNORMAL LOW (ref 135–145)
TOTAL PROTEIN: 6.7 g/dL (ref 6.5–8.1)

## 2019-02-01 LAB — CBC WITH DIFFERENTIAL/PLATELET
Abs Immature Granulocytes: 0.22 10*3/uL — ABNORMAL HIGH (ref 0.00–0.07)
Basophils Absolute: 0 10*3/uL (ref 0.0–0.1)
Basophils Relative: 0 %
EOS PCT: 1 %
Eosinophils Absolute: 0.1 10*3/uL (ref 0.0–0.5)
HCT: 35.3 % — ABNORMAL LOW (ref 36.0–46.0)
Hemoglobin: 11.2 g/dL — ABNORMAL LOW (ref 12.0–15.0)
Immature Granulocytes: 1 %
Lymphocytes Relative: 13 %
Lymphs Abs: 2 10*3/uL (ref 0.7–4.0)
MCH: 23.5 pg — ABNORMAL LOW (ref 26.0–34.0)
MCHC: 31.7 g/dL (ref 30.0–36.0)
MCV: 74.2 fL — ABNORMAL LOW (ref 80.0–100.0)
Monocytes Absolute: 1 10*3/uL (ref 0.1–1.0)
Monocytes Relative: 6 %
Neutro Abs: 12.5 10*3/uL — ABNORMAL HIGH (ref 1.7–7.7)
Neutrophils Relative %: 79 %
Platelets: 200 10*3/uL (ref 150–400)
RBC: 4.76 MIL/uL (ref 3.87–5.11)
RDW: 17.3 % — ABNORMAL HIGH (ref 11.5–15.5)
WBC: 15.7 10*3/uL — ABNORMAL HIGH (ref 4.0–10.5)
nRBC: 0 % (ref 0.0–0.2)

## 2019-02-01 MED ORDER — DEXAMETHASONE 2 MG PO TABS
2.0000 mg | ORAL_TABLET | Freq: Every day | ORAL | Status: DC
  Administered 2019-02-02 – 2019-02-03 (×2): 2 mg via ORAL
  Filled 2019-02-01 (×2): qty 1

## 2019-02-01 NOTE — IPOC Note (Addendum)
Overall Plan of Care [facility]) Patient Details Name: Elira Colasanti MRN: [id number] DOB: September 15, 1971  Admitting Diagnosis: <principal problem not specified>  Hospital Problems: Active Problems:   Myelopathy ([place])   Anemia of chronic disease   Leukocytosis   Neurogenic bowel   Neurogenic bladder   E. coli UTI     Functional Problem List: Nursing Bladder, Bowel, Edema, Endurance, Pain, Safety, Skin Integrity  PT Balance, Endurance, Motor, Perception, Safety, Pain, Sensory, Skin Integrity  OT Balance, Endurance, Motor, Pain, Perception, Safety, Sensory  SLP    TR         Basic ADL's: OT Grooming, Bathing, Dressing, Toileting     Advanced  ADL's: OT       Transfers: PT Bed Mobility, Bed to Chair, Car, [employer], Floor  OT Toilet     Locomotion: PT Ambulation, Emergency planning/management officer, Stairs     Additional Impairments: OT None  SLP        TR      Anticipated Outcomes Item Anticipated Outcome  Self Feeding n/a  Swallowing      Basic self-care  min - mod A  Toileting  mod A   Bathroom Transfers mod A toileting  Bowel/Bladder  Moderate x2 assistance to BSC, PVR with each void.  Transfers  min A  Locomotion  min A at w/c level  Communication     Cognition     Pain  <3 on a 1-10 pain scale  Safety/Judgment  moderate x2 assistance with appropriate safety device   Therapy Plan: PT Intensity: Minimum of 1-2 x/day ,45 to 90 minutes PT Frequency: 5 out of 7 days PT Duration Estimated Length of Stay: 24-27 days OT Intensity: Minimum of 1-2 x/day, 45 to 90 minutes OT Frequency: 5 out of 7 days OT Duration/Estimated Length of Stay: 24- 27 days      Team Interventions: Nursing Interventions Patient/Family Education, Pain Management, Bladder Management, Medication Management, Discharge Planning, Bowel Management, Skin Care/Wound Management, Disease Management/Prevention, Psychosocial Support  PT interventions Balance/vestibular training, Community  reintegration, Discharge planning, Disease management/prevention, DME/adaptive equipment instruction, Functional mobility training, Functional electrical stimulation, Neuromuscular re-education, Pain management, Patient/family education, Psychosocial support, Skin care/wound management, Splinting/orthotics, Therapeutic Activities, Therapeutic Exercise, UE/LE Strength taining/ROM, UE/LE Coordination activities, Wheelchair propulsion/positioning  OT Interventions Training and development officer, Disease mangement/prevention, Self Care/advanced ADL retraining, Therapeutic Exercise, Wheelchair propulsion/positioning, Cognitive remediation/compensation, DME/adaptive equipment instruction, UE/LE Strength taining/ROM, Pain management, Community reintegration, Functional electrical stimulation, Patient/family education, UE/LE Coordination activities, Discharge planning, Functional mobility training, Psychosocial support, Therapeutic Activities  SLP Interventions    TR Interventions    SW/CM Interventions Discharge Planning, Psychosocial Support, Patient/Family Education   Barriers to Discharge MD  Medical stability, Neurogenic bowel and bladder and Weight  Nursing Medical stability, Incontinence, Medication compliance, Decreased caregiver support, Lack of/limited family support, Weight    PT Inaccessible home environment, Decreased caregiver support, Medical stability, Home environment access/layout, Incontinence, Neurogenic Bowel & Bladder, Weight    OT Inaccessible home environment, Home environment access/layout    SLP      SW       Team Discharge Planning: Destination: PT-Home ,OT- Home , SLP-  Projected Follow-up: PT-Home health PT, 24 hour supervision/assistance, OT-  Home health OT, 24 hour supervision/assistance, SLP-  Projected Equipment Needs: PT-Wheelchair (measurements), Wheelchair cushion (measurements), To be determined, OT- To be determined, SLP-  Equipment Details: PT-w/c type and size TBD  pending progress, OT-  Patient/family involved in discharge planning: PT- Patient,  OT-Patient, SLP-   MD ELOS: 24-27 days Medical  Rehab Prognosis:  Good Assessment: The patient has been admitted for CIR therapies with the diagnosis of thoracic myelopathy with paraplegia. The team will be addressing functional mobility, strength, stamina, balance, safety, adaptive techniques and equipment, self-care, bowel and bladder mgt, patient and caregiver education, NMR, w/c assessment/use, ego support, skin care. Goals have been set at moderate to min assist with self-care and min assist for transfers and w/c mobility.    [name], MD, FAAPMR      See Team Conference Notes for weekly updates to the plan of care

## 2019-02-01 NOTE — Progress Notes (Signed)
Occupational Therapy Session Note  Patient Details  Name: Ellen Hill MRN: [id number] Date of Birth: [birth year]  Today's Date: 02/01/2019 OT Individual Time: 0800-0900 and 1030-1100 OT Individual Time Calculation (min): 60 min and 30 min    Short Term Goals: Week 1:  OT Short Term Goal 1 (Week 1): Pt will tolerate sitting EOB/mat for 3 minutes for self care tasks.  OT Short Term Goal 2 (Week 1): Pt will perform UB self care with min A overall in order to reduce assistance with functional tasks.  OT Short Term Goal 3 (Week 1): Pt will demonstrate B UE strengthening HEP with supervision and use of paper handout as needed.  Skilled Therapeutic Interventions/Progress Updates:   Session 1: Upon entering the room, pt supine in bed and reports feelings of constipation and just " not feeling good". Pt is still agreeable to OT intervention. Pt bathing from bed level with assistance to wash lower body and +2 assist for rolling and hygiene. Pt verbalized urge to void and placed onto bedpan with +2 assist. OT wrapping B LEs to thighs this session with noted increase in sensation in L LE per pt report. Clothing donned with +2 assistance for rolling and pulling over B hips and down trunk. Sling for maxi sky placed under pt as well. Pt given few minutes to rest with RN present in room before next session.   Session 2:  Upon entering the room, pt seated in bariatric chair with continued complaints of not feeling well. +2 assistance to transfer pt from chair > bed to rest with use of maxi sky. OT discussed plan to get onto commode chair in conjunction with enema that RN would be giving this session in order to increase chances of having successful BM. Pt remained in bed with call bell and all needed items within reach upon exiting the room.   Therapy Documentation Precautions:  Precautions Precautions: Back, Fall Precaution Comments: obese. No brace needed Restrictions Weight Bearing Restrictions:  No General: General PT Missed Treatment Reason: Nursing care Vital Signs: Therapy Vitals Temp: 98.8 F (37.1 C) Pulse Rate: (!) 105 Resp: 20 BP: (!) 149/93 Patient Position (if appropriate): Lying Oxygen Therapy SpO2: 100 % O2 Device: Room Air   Therapy/Group: Individual Therapy  [name] 02/01/2019, 4:18 PM

## 2019-02-01 NOTE — Progress Notes (Signed)
Inpatient Rehabilitation  Patient information reviewed and entered into eRehab system by Mailynn Everly M. Jakory Matsuo, M.A., CCC/SLP, PPS Coordinator.  Information including medical coding, functional ability and quality indicators will be reviewed and updated through discharge.    

## 2019-02-01 NOTE — Discharge Instructions (Signed)
Inpatient Rehab Discharge Instructions  [place] Discharge date and time: No discharge date for patient encounter.   Activities/Precautions/ Functional Status: Activity: activity as tolerated Diet: regular diet Wound Care: keep wound clean and dry Functional status:  ___ No restrictions     ___ Walk up steps independently ___ 24/7 supervision/assistance   ___ Walk up steps with assistance ___ Intermittent supervision/assistance  ___ Bathe/dress independently ___ Walk with walker     _x__ Bathe/dress with assistance ___ Walk Independently    ___ Shower independently ___ Walk with assistance    ___ Shower with assistance ___ No alcohol     ___ Return to work/school ________  Special Instructions:    My questions have been answered and I understand these instructions. I will adhere to these goals and the provided educational materials after my discharge from the hospital.  Patient/Caregiver Signature _______________________________ Date __________  Clinician Signature _______________________________________ Date __________  Please bring this form and your medication list with you to all your follow-up doctor's appointments. 2

## 2019-02-01 NOTE — Progress Notes (Signed)
Social Work Assessment and Plan  Patient Details  Name: Ellen Hill MRN: [id number] Date of Birth: [birth year]  Today's Date: 02/01/2019  Problem List:  Patient Active Problem List   Diagnosis Date Noted  . Anemia of chronic disease   . Leukocytosis   . Neurogenic bowel   . Neurogenic bladder   . E. coli UTI   . Myelopathy ([place]) 01/29/2019  . 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 Lumbar back pain with radiculopathy affecting lower extremity 12/08/2018  . Dysmenorrhea 04/13/2012  . Class 3 severe obesity due to excess calories without serious comorbidity with body mass index (BMI) of 60.0 to 69.9 in adult ([place]) 04/13/2012  . Anemia 04/13/2012  . Fibroids 04/13/2012  . Menorrhagia 03/07/2012   Past Medical History:  Past Medical History:  Diagnosis Date  . Abnormal Pap smear    history  . Anemia   . ASCUS (atypical squamous cells of undetermined significance) on Pap smear 2008   At 36wk of pregnancy ; colpo  . Asthma    rarely uses inhaler (seasonal)  . 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 Fibroids   . H/O candidiasis   . H/O fatigue 08/2007  . H/O varicella   . Headache(784.0)    migraines   . Heart murmur    dx child - no problems as adult  . Hx: UTI (urinary tract infection) 07/29/07  . Increased BMI   . Irregular bleeding   . Migraines   . Obesity 02/13/12  . Pelvic pain 02/13/12  . Postpartum anemia 08/18/07  . 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 after loss of parents, is fine  . Vaginal Pap smear, abnormal    f/u wnl   Past Surgical History:  Past Surgical History:  Procedure Laterality Date  . CERVICAL POLYPECTOMY  05/01/2012   Procedure: CERVICAL POLYPECTOMY;  Surgeon: Betsy Coder, MD;  Location: [facility];  Service: Gynecology;  Laterality: N/A;  . CESAREAN SECTION  07/04/2007  . CHOLECYSTECTOMY  07/06/2013  . CHOLECYSTECTOMY N/A 07/06/2013   Procedure: LAPAROSCOPIC CHOLECYSTECTOMY ;  Surgeon: Gayland Curry, MD;  Location: [facility];  Service: General;   Laterality: N/A;  attempted cholangiogram  . fibroidecto    . fibroidectomy  07/04/2007  . LUMBAR LAMINECTOMY/DECOMPRESSION MICRODISCECTOMY N/A 01/19/2019   Procedure: Thoracic Nine-Lumbar One Decompressive Laminectomy;  Surgeon: Earnie Larsson, MD;  Location: [facility];  Service: Neurosurgery;  Laterality: N/A;  posterior  . MYOMECTOMY  07/04/08  . OPERATIVE HYSTEROSCOPY  05/01/2012   no menses since that time  . TUBAL LIGATION  07/04/2007   Social History:  reports that she quit smoking about 19 years ago. Her smoking use included cigarettes. She has a 0.30 pack-year smoking history. She has never used smokeless tobacco. She reports that she does not drink alcohol or use drugs.  Family / Support Systems Marital Status: Single Patient Roles: Parent, Other (Comment)(sister; employee) Other Supports: Ellen Hill - sister - [phone] Anticipated Caregiver: friends and family Ability/Limitations of Caregiver: Ellen Hill works; her best freind works but Estate manager/land agent and pt state they will arrrange friends and family to provide close to 24/7 asisst Caregiver Availability: 24/7 Family Dynamics: close, supportive family  Social History Preferred language: English Religion: Holiness/[employer] Read: Yes Write: Yes Employment Status: Employed Name of Employer: [place] [employer] [place] [employer] of Employment: 3 Return to Work Plans: Pt wishes to return to work when she is able. Legal History/Current Legal Issues: none reported Guardian/Conservator: N/A - MD has determined  that pt is capable of making her own decisions.    Abuse/Neglect Abuse/Neglect Assessment Can Be Completed: Yes Physical Abuse: Denies Verbal Abuse: Denies Sexual Abuse: Denies Exploitation of patient/patient's resources: Denies Self-Neglect: Denies  Emotional Status Pt's affect, behavior and adjustment status: Pt reports having some hard moments/days, but tries to keep fighting and stay positive.  She appreciates  all of the support she's received so far. Recent Psychosocial Issues: Pt with an 46y/o dtr Ellen Hill) who is 6th grade at [employer].  She looks forward to getting home to her. Psychiatric History: none reported Substance Abuse History: none reported  Patient / Family Perceptions, Expectations & Goals Pt/Family understanding of illness & functional limitations: Pt reports a good understanding of her condition. Premorbid pt/family roles/activities: Pt goes to work and her dtr's sporting events. Anticipated changes in roles/activities/participation: Pt would like to resume these activities as she is able. Pt/family expectations/goals: Pt is taking this time to "self-reflect" and to look forward past this time to what her life will be like.  Community Resources [employer]: None Premorbid Home Care/DME Agencies: None Transportation available at discharge: family, if pt is able to travel via Academic librarian referrals recommended: Neuropsychology  Discharge Planning Living Arrangements: Children, Other relatives(Sister and pt's 11y/o dtr) Support Systems: Children, Other relatives, Friends/neighbors, Social worker community Type of Residence: Private residence Insurance Resources: Multimedia programmer (specify)(State [employer]) Museum/gallery curator Resources: Employment, Secondary school teacher Screen Referred: No Money Management: Patient Does the patient have any problems obtaining your medications?: No Home Management: Pt and her sister were sharing these responsibilites. Patient/Family Preliminary Plans: Pt plans to d/c to her friend's home that is one level, as her home with her sister has a flight of stairs to enter.  Sister and others will come to friend's home to care for pt there. Social Work Anticipated Follow Up Needs: [place] Additional Notes/Comments: It is anticipated that pt will need min to mod assistance at home at d/c.  CSW told pt of  this on assessment and that 46y/o dtr would not be an appropriate caregiver due to anticipated level of assistance needed. Expected length of stay: 24 to 28 days  Clinical Impression CSW met with pt to introduce self and role of CSW, as well as to complete assessment.  Pt was talkative with CSW and opened up about her experience and how she is coping with the physical changes she has experienced.  Pt's faith is important to her and helps in these situations.  She also has strong, positive self talk and is working through emotions with this strategy.  She admits to some hard moments, but overall has great support and love from family and friends.  Her sister is caring for her 11y/o dtr, so pt is confident that she is well cared for.  CSW talked with pt about the need for 24/7 hands on care.  She acknowledged this, but team should continue to tell her what that means and show her what that will look like, as her 48y/o being with her would not be an acceptable caregiver.  Pt reports a positive experience on CIR already and that she is grateful to be here.  CSW will continue to follow and assist as needed.  Neva Ramaswamy, Silvestre Mesi 02/01/2019, 3:18 PM

## 2019-02-01 NOTE — Progress Notes (Signed)
Physical Therapy Session Note  Patient Details  Name: Ellen Hill MRN: [id number] Date of Birth: [birth year]  Today's Date: 02/01/2019 PT Individual Time: 0900-1000; 1315-1400 PT Individual Time Calculation (min): 60 min and 45 min PT Missed Time: 15 min Missed Time Reason: Nursing Care  Short Term Goals: Week 1:  PT Short Term Goal 1 (Week 1): Pt will perform rolling in bed with assist x 1 consistently PT Short Term Goal 2 (Week 1): Pt will tolerate sitting EOB x 10 min PT Short Term Goal 3 (Week 1): Pt will initiate w/c mobility  Skilled Therapeutic Interventions/Progress Updates:    Session 1: Pt received supine in bed from OT session, agreeable to PT. Pt has some LLE pain with mobility, not rated and decreases when at rest. Pt has increased sensitivity in LLE this date and notes some return of light touch sensation. Dependent transfer via maxisky to bari-chair. Seated BP 131/94. Dependent transport via bari-chair to therapy gym, maxisky transfer to bariatric therapy mat. Session focus on sitting balance EOM with gradual decrease in posterior support. Pt initially starts with BUE in tripod position for balance, transition to holding onto rails in bari-chair in front of her then transition to [place] in lap. Pt requires anywhere from CGA to mod A to maintain sitting balance. Pt tolerates sitting EOM x 15 min. Maxisky transfer back to bari-chair. Pt left reclined in bari-chair in room with needs in reach.  Session 2: Pt received supine in bed. Pt about to have I/O cath performed by RN, missed 15 min at beginning of therapy session for RN care. Rolling L/R with max A and use of bedrails for RN to perform soap sud enema on patient and for placement of maxisky sling. Maxisky transfer bed to bedside commode dependently with assist x 2 for safety. Pt is able to have a continent bowel movement once seated on bedside commode. Pt requires CGA to min A to maintain sitting balance on commode. Pt has  some pain in abdominal region while seated on commode, subsides once back in supine. Maxisky transfer back to bed with assist x 2. Rolling L/R for removal of sling, dependent for pericare and donning of brief. Pt left semi-reclined in bed with needs in reach.   Therapy Documentation Precautions:  Precautions Precautions: Back, Fall Precaution Comments: obese. No brace needed Restrictions Weight Bearing Restrictions: No   Therapy/Group: Individual Therapy   Excell Seltzer, PT, DPT  02/01/2019, 3:36 PM

## 2019-02-01 NOTE — Progress Notes (Signed)
[facility] PHYSICAL MEDICINE & REHABILITATION PROGRESS NOTE  Subjective/Complaints: Pt states that weekend was ok. Still can't move bowels (although RN flow sheet indicates medium BM yesterday). Appetite fair. Pain reasonable  ROS: Patient denies fever, rash, sore throat, blurred vision, nausea, vomiting, diarrhea, cough, shortness of breath or chest pain,   headache, or mood change.   Objective: Vital Signs: Blood pressure (!) 147/68, pulse (!) 102, temperature 98.5 F (36.9 C), temperature source Oral, resp. rate 17, height 5\' 6"  (1.676 m), SpO2 94 %. Vas [place] Lower Extremity Venous (dvt)  Result Date: 01/30/2019  Lower Venous Study Indications: Swelling, and Edema.  Limitations: Body habitus, poor ultrasound/tissue interface and bandages. Performing Technologist: Abram Sander RVS  Examination Guidelines: A complete evaluation includes B-mode imaging, spectral Doppler, color Doppler, and power Doppler as needed of all accessible portions of each vessel. Bilateral testing is considered an integral part of a complete examination. Limited examinations for reoccurring indications may be performed as noted.  Right Venous Findings: +---------+---------------+---------+-----------+----------+--------------+          CompressibilityPhasicitySpontaneityPropertiesSummary        +---------+---------------+---------+-----------+----------+--------------+ CFV      Full           Yes      Yes                                 +---------+---------------+---------+-----------+----------+--------------+ SFJ      Full                                                        +---------+---------------+---------+-----------+----------+--------------+ FV Prox  Full                                                        +---------+---------------+---------+-----------+----------+--------------+ FV Mid   Full                                                         +---------+---------------+---------+-----------+----------+--------------+ FV Distal                                             Not visualized +---------+---------------+---------+-----------+----------+--------------+ PFV      Full                                                        +---------+---------------+---------+-----------+----------+--------------+ POP      Full           Yes      Yes                  limited        +---------+---------------+---------+-----------+----------+--------------+ PTV  Not visualized +---------+---------------+---------+-----------+----------+--------------+ PERO                                                  Not visualized +---------+---------------+---------+-----------+----------+--------------+  Left Venous Findings: +---------+---------------+---------+-----------+----------+--------------+          CompressibilityPhasicitySpontaneityPropertiesSummary        +---------+---------------+---------+-----------+----------+--------------+ CFV      Full           Yes      Yes                                 +---------+---------------+---------+-----------+----------+--------------+ SFJ      Full                                                        +---------+---------------+---------+-----------+----------+--------------+ FV Prox  Full                                                        +---------+---------------+---------+-----------+----------+--------------+ FV Mid   Full                                                        +---------+---------------+---------+-----------+----------+--------------+ FV Distal                                             Not visualized +---------+---------------+---------+-----------+----------+--------------+ PFV      Full                                                         +---------+---------------+---------+-----------+----------+--------------+ POP      Full           Yes      Yes                  limited        +---------+---------------+---------+-----------+----------+--------------+ PTV                                                   Not visualized +---------+---------------+---------+-----------+----------+--------------+ PERO                                                  Not visualized +---------+---------------+---------+-----------+----------+--------------+    Summary: Right: There is no evidence of  deep vein thrombosis in the lower extremity. However, portions of this examination were limited- see technologist comments above. No cystic structure found in the popliteal fossa. Left: There is no evidence of deep vein thrombosis in the lower extremity. However, portions of this examination were limited- see technologist comments above. No cystic structure found in the popliteal fossa.  *See table(s) above for measurements and observations. Electronically signed by Monica Martinez MD on 01/30/2019 at 5:51:37 PM.    Final    Recent Labs    02/01/19 0521  WBC 15.7*  HGB 11.2*  HCT 35.3*  PLT 200   Recent Labs    02/01/19 0521  NA 134*  K 4.7  CL 105  CO2 20*  GLUCOSE 131*  BUN 15  CREATININE 0.69  CALCIUM 8.6*    Physical Exam: BP (!) 147/68 (BP Location: Right Arm)   Pulse (!) 102   Temp 98.5 F (36.9 C) (Oral)   Resp 17   Ht 5\' 6"  (1.676 m)   SpO2 94%   BMI 64.76 kg/m  Constitutional: No distress . Vital signs reviewed. obese HEENT: EOMI, oral membranes moist Neck: supple Cardiovascular: RRR without murmur. No JVD    Respiratory: CTA Bilaterally without wheezes or rales. Normal effort    GI: BS +, non-tender, non-distended  Musc: No edema or tenderness in extremities. Neurological: She isalert and oriented.   Motor: Bilateral UE 5/5.  RLE HF, KE 2/5, 3/5 ADF, no change  LLE: 0/5 prox to distal, no change.   Skin: Skin iswarm.  Psychiatric: pleasant   Assessment/Plan: 1. 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  Physiatrist is providing close team supervision and 24 hour management of active medical problems listed below.  Physiatrist and rehab team continue to assess barriers to discharge/monitor patient progress toward functional and medical goals  Care Tool:  Bathing    Body parts bathed by patient: Right arm, Left arm, Chest, Abdomen, Face   Body parts bathed by helper: Front perineal area, Buttocks, Right upper leg, Left upper leg, Right lower leg, Left lower leg     Bathing assist Assist Level: Maximal Assistance - Patient 24 - 49%     Upper Body Dressing/Undressing Upper body dressing   What is the patient wearing?: Hospital gown only    Upper body assist Assist Level: Set up assist    Lower Body Dressing/Undressing Lower body dressing      What is the patient wearing?: Incontinence brief     Lower body assist Assist for lower body dressing: 2 Helpers     Toileting Toileting    Toileting assist Assist for toileting: 2 Helpers     Transfers Chair/bed transfer  Transfers assist     Chair/bed transfer assist level: Dependent - mechanical lift     Locomotion Ambulation   Ambulation assist   Ambulation activity did not occur: Safety/medical concerns          Walk 10 feet activity   Assist  Walk 10 feet activity did not occur: Safety/medical concerns        Walk 50 feet activity   Assist Walk 50 feet with 2 turns activity did not occur: Safety/medical concerns         Walk 150 feet activity   Assist Walk 150 feet activity did not occur: Safety/medical concerns         Walk 10 feet on uneven  surface  activity   Assist Walk 10 feet on uneven surfaces activity did not occur:  Safety/medical concerns         Wheelchair     Assist Will patient use wheelchair at discharge?: Yes Type of Wheelchair: (TBD) Wheelchair activity did not occur: Safety/medical concerns         Wheelchair 50 feet with 2 turns activity    Assist    Wheelchair 50 feet with 2 turns activity did not occur: Safety/medical concerns       Wheelchair 150 feet activity     Assist Wheelchair 150 feet activity did not occur: Safety/medical concerns          Medical Problem List and Plan: 1.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 No back brace required  Continue CIR therapies 2. DVT Prophylaxis/Anticoagulation: SCDs.   Vascular study limited, but negative 3. Pain Management:Neurontin 300 mg 3 times a day, Flexeril 10 mg every 8 hours and when necessary, hydrocodone as needed 4. Mood:Provide emotional support 5. Neuropsych: This patientiscapable of making decisions on herown behalf. 6. Skin/Wound Care:Routine skin checks 7. Fluids/Electrolytes/Nutrition:Routine ins and outs  Continue to encourage PO 8.Proteus/Escherichia coli UTI.   Bactrim started on 01/27/2019, both sensitive 9.  Super obesity. Latest BMI 64.79. Dietary follow-up 10. Neurogenic bowel and bladder. Establish bowel program. Provide education.  PVRs showing retention  Flomax started on 2/8  Bowel meds increased on 2/9  -had small/medium BM yesterday  -SSE today to see if we can empty her out 11.  Leukocytosis  WBCs 17.7 on 2/6---> 15.7 today  -likely related to UTI and steroids    Afebrile  Cont to monitor 12.?  Anemia of chronic disease  Hemoglobin 11.2 2/10      LOS: 3 days A FACE TO FACE EVALUATION WAS PERFORMED  [name] 02/01/2019, 8:44 AM

## 2019-02-02 ENCOUNTER — Inpatient Hospital Stay (HOSPITAL_COMMUNITY): Payer: Self-pay | Admitting: Occupational Therapy

## 2019-02-02 ENCOUNTER — Inpatient Hospital Stay (HOSPITAL_COMMUNITY): Payer: Self-pay | Admitting: Physical Therapy

## 2019-02-02 ENCOUNTER — Encounter (HOSPITAL_COMMUNITY): Payer: Self-pay | Admitting: *Deleted

## 2019-02-02 MED ORDER — POLYETHYLENE GLYCOL 3350 17 G PO PACK
17.0000 g | PACK | Freq: Two times a day (BID) | ORAL | Status: DC
  Administered 2019-02-02 – 2019-02-08 (×6): 17 g via ORAL
  Filled 2019-02-02 (×10): qty 1

## 2019-02-02 NOTE — Progress Notes (Signed)
Occupational Therapy Session Note  Patient Details  Name: Ellen Hill MRN: [id number] Date of Birth: 1971/03/01  Today's Date: 02/02/2019 OT Individual Time: 3734-2876 OT Individual Time Calculation (min): 75 min    Short Term Goals: Week 1:  OT Short Term Goal 1 (Week 1): Pt will tolerate sitting EOB/mat for 3 minutes for self care tasks.  OT Short Term Goal 2 (Week 1): Pt will perform UB self care with min A overall in order to reduce assistance with functional tasks.  OT Short Term Goal 3 (Week 1): Pt will demonstrate B UE strengthening HEP with supervision and use of paper handout as needed.  Skilled Therapeutic Interventions/Progress Updates:    Upon entering the room, pt seated in wheelchair awaiting OT arrival with no c/o pain and motivated for OT intervention. OT assisted pt via wheelchair to gym and transferred onto EOM with use of maxi sky. Pt seated on EOM for 30 minutes this session with intermittent rest periods as needed. Pt able to place hands into lap and maintain static sitting balance with close supervision for 2-3 minutes. Boxing gloves donned and pt hitting various targets in all planes by reaching out of base of support with min A for balance to return to midline. Pt performing bouts of 20 - 25 reps ( x 5 this session) with B UEs before taking rest break. Pt returned to wheelchair via maxi sky and call bell within reach when exiting pt's room.   Therapy Documentation Precautions:  Precautions Precautions: Back, Fall Precaution Comments: obese. No brace needed Restrictions Weight Bearing Restrictions: No General: General PT Missed Treatment Reason: Nursing care Vital Signs: Therapy Vitals Pulse Rate: (!) 113 Resp: 19 BP: 137/82 Patient Position (if appropriate): Lying Oxygen Therapy O2 Device: Room Air Pain: Pain Assessment Pain Scale: 0-10 Pain Score: 8  Pain Type: Acute pain Pain Location: Leg Pain Orientation: Left Pain Descriptors / Indicators:  Aching Pain Frequency: Intermittent Patients Stated Pain Goal: 0 Pain Intervention(s): Medication (See eMAR)   Therapy/Group: Individual Therapy  [name] 02/02/2019, 4:14 PM

## 2019-02-02 NOTE — Progress Notes (Signed)
Ok to stop abx 2/12 per Dr Naaman Plummer.  Onnie Boer, PharmD, BCIDP, AAHIVP, CPP Infectious Disease Pharmacist 02/02/2019 9:45 AM

## 2019-02-02 NOTE — Progress Notes (Signed)
Inpatient [facility] Individual Statement of Services  Patient Name:  Ellen Hill [facility]  Date:  02/02/2019  Welcome to the [facility].  Our goal is to provide you with an individualized program based on your diagnosis and situation, designed to meet your specific needs.  With this comprehensive rehabilitation program, you will be expected to participate in at least 3 hours of rehabilitation therapies Monday-Friday, with modified therapy programming on the weekends.  Your rehabilitation program will include the following services:  Physical Therapy (PT), Occupational Therapy (OT), 24 hour per day rehabilitation nursing, Neuropsychology, Case Management (Social Worker), Rehabilitation Medicine, Nutrition Services and Pharmacy Services  Weekly team conferences will be held on Tuesdays to discuss your progress.  Your Social Worker will talk with you frequently to get your input and to update you on team discussions.  Team conferences with you and your family in attendance may also be held.  Expected length of stay:  24 to 28 days  Overall anticipated outcome:  Minimal to moderate assistance  Depending on your progress and recovery, your program may change. Your Social Worker will coordinate services and will keep you informed of any changes. Your Social Worker's name and contact numbers are listed  below.  The following services may also be recommended but are not provided by the [facility] will be made to provide these services after discharge if needed.  Arrangements include referral to agencies that provide these services.  Your insurance has been verified to be:  [place] Your primary doctor is:  Dr. Grier Mitts  Pertinent information will be shared with your doctor and your  insurance company.  Social Worker:  Alfonse Alpers, LCSW  [phone] or [phone]  Information discussed with and copy given to patient by: Trey Sailors, 02/02/2019, 10:29 AM

## 2019-02-02 NOTE — Progress Notes (Signed)
[facility] PHYSICAL MEDICINE & REHABILITATION PROGRESS NOTE  Subjective/Complaints: Had some results with SSE yesterday. Has noticed more sensation in left leg. ?increase in strength  ROS: Patient denies fever, rash, sore throat, blurred vision, nausea, vomiting, diarrhea, cough, shortness of breath or chest pain,  headache, or mood change.   Objective: Vital Signs: Blood pressure (!) 147/97, pulse (!) 103, temperature 98.4 F (36.9 C), temperature source Oral, resp. rate 19, height 5\' 6"  (1.676 m), SpO2 100 %. No results found. Recent Labs    02/01/19 0521  WBC 15.7*  HGB 11.2*  HCT 35.3*  PLT 200   Recent Labs    02/01/19 0521  NA 134*  K 4.7  CL 105  CO2 20*  GLUCOSE 131*  BUN 15  CREATININE 0.69  CALCIUM 8.6*    Physical Exam: BP (!) 147/97 (BP Location: Right Arm)   Pulse (!) 103   Temp 98.4 F (36.9 C) (Oral)   Resp 19   Ht 5\' 6"  (1.676 m)   SpO2 100%   BMI 64.76 kg/m  Constitutional: No distress . Vital signs reviewed. obese HEENT: EOMI, oral membranes moist Neck: supple Cardiovascular: RRR without murmur. No JVD    Respiratory: CTA Bilaterally without wheezes or rales. Normal effort    GI: BS +, non-tender, non-distended  Musc: No edema or tenderness in extremities. Neurological: She isalert and oriented.   Motor: Bilateral UE 5/5.  RLE HF, KE 2/5, 3/5 ADF, no change  LLE: 0-tr/5 prox to distal, no change.  Senses pain and gross touch in both legs Skin: Skin iswarm.  Psychiatric: pleasant   Assessment/Plan: 1. 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  Physiatrist is providing close team supervision and 24 hour management of active medical problems listed below.  Physiatrist and rehab team continue to assess barriers to discharge/monitor patient progress toward functional and medical goals  Care  Tool:  Bathing    Body parts bathed by patient: Right arm, Left arm, Chest, Abdomen, Face   Body parts bathed by helper: Front perineal area, Buttocks, Right upper leg, Left upper leg, Right lower leg, Left lower leg     Bathing assist Assist Level: 2 Helpers     Upper Body Dressing/Undressing Upper body dressing   What is the patient wearing?: Pull over shirt    Upper body assist Assist Level: Maximal Assistance - Patient 25 - 49%    Lower Body Dressing/Undressing Lower body dressing      What is the patient wearing?: Incontinence brief, Pants     Lower body assist Assist for lower body dressing: 2 Helpers     Toileting Toileting    Toileting assist Assist for toileting: 2 Helpers     Transfers Chair/bed transfer  Transfers assist     Chair/bed transfer assist level: Dependent - mechanical lift     Locomotion Ambulation   Ambulation assist   Ambulation activity did not occur: Safety/medical concerns          Walk 10 feet activity   Assist  Walk 10 feet activity did not occur: Safety/medical concerns        Walk 50 feet activity   Assist Walk 50 feet with 2 turns activity did not occur: Safety/medical concerns         Walk 150 feet activity   Assist Walk 150 feet activity did not occur: Safety/medical concerns  Walk 10 feet on uneven surface  activity   Assist Walk 10 feet on uneven surfaces activity did not occur: Safety/medical concerns         Wheelchair     Assist Will patient use wheelchair at discharge?: Yes Type of Wheelchair: (TBD) Wheelchair activity did not occur: Safety/medical concerns         Wheelchair 50 feet with 2 turns activity    Assist    Wheelchair 50 feet with 2 turns activity did not occur: Safety/medical concerns       Wheelchair 150 feet activity     Assist Wheelchair 150 feet activity did not occur: Safety/medical concerns          Medical Problem List and  Plan: 1.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 No back brace required  Continue CIR therapies/  -Interdisciplinary Team Conference today   2. DVT Prophylaxis/Anticoagulation: SCDs.   Vascular study limited, but negative 3. Pain Management:Neurontin 300 mg 3 times a day, Flexeril 10 mg every 8 hours and when necessary, hydrocodone as needed 4. Mood:Provide emotional support 5. Neuropsych: This patientiscapable of making decisions on herown behalf. 6. Skin/Wound Care:Routine skin checks 7. Fluids/Electrolytes/Nutrition:Routine ins and outs  Continue to encourage PO 8.Proteus/Escherichia coli UTI.   Bactrim started on 01/27/2019, both sensitive 9.  Super obesity. Latest BMI 64.79. Dietary follow-up 10. Neurogenic bowel and bladder.    PVRs showing retention  Flomax started on 2/8  Bowel meds increased on 2/9  -had two small/medium BM yesterday after enema   -continue senna-s at night, dulcolax supp in am  -increase miralax to bid 11.  Leukocytosis  WBCs 17.7 on 2/6---> 15.7 2/10  -likely related to UTI and steroids    Afebrile  Cont to monitor 12.?  Anemia of chronic disease  Hemoglobin 11.2 2/10      LOS: 4 days A FACE TO FACE EVALUATION WAS PERFORMED  [name] 02/02/2019, 7:40 AM

## 2019-02-02 NOTE — Progress Notes (Signed)
Physical Therapy Session Note  Patient Details  Name: Ellen Hill MRN: [id number] Date of Birth: 1971/04/29  Today's Date: 02/02/2019 PT Individual Time: 0800-0900; 1315-1400 PT Individual Time Calculation (min): 60 min and 45 min PT Missed Time: 15 min Missed Time Reason: Nursing Care  Short Term Goals: Week 1:  PT Short Term Goal 1 (Week 1): Pt will perform rolling in bed with assist x 1 consistently PT Short Term Goal 2 (Week 1): Pt will tolerate sitting EOB x 10 min PT Short Term Goal 3 (Week 1): Pt will initiate w/c mobility  Skilled Therapeutic Interventions/Progress Updates:    Session 1: Pt received seated in bed, agreeable to PT session. No complaints of pain and reports decreased hypersensitivity of LLE this date. Rolling L/R with assist x 2 and use of bedrails for dependent pericare due to urinary incontinence. Pt is dependent to don new brief and her skirt, mod A to shirt while seated in bed, dependent for BLE ACE wrap and donning of shoes. Rolling L/R with max A x 1-2 and bedrails for placement of maxisky sling. Maxisky transfer bed to manual w/c. Pt left seated in manual w/c in room with needs in reach.  Session 2: Pt received seated in bed on bedpan. Pt missed 15 min of scheduled therapy session due to RN care and use of bedpan. Rolling L/R with assist x 2 and use of bedrails for dependent pericare and donning of new brief. Use of bed features for standing and weight-bearing through BLE. Pt tolerates standing at 35 degree elevation x 5 min, 40 degree x 5 min, and 45 degree elevation x 5 min. BP 128/100 in standing, pt remains asymptomatic. Pt does demonstrate B knee buckling in stance, unable to activate quads for knee ext. Pt demos good tolerance for standing this date. Pt left supine in bed with needs in reach at end of therapy session.  Therapy Documentation Precautions:  Precautions Precautions: Back, Fall Precaution Comments: obese. No brace  needed Restrictions Weight Bearing Restrictions: No    Therapy/Group: Individual Therapy   Excell Seltzer, PT, DPT  02/02/2019, 11:11 AM

## 2019-02-03 ENCOUNTER — Encounter (HOSPITAL_COMMUNITY): Payer: Self-pay | Admitting: Psychology

## 2019-02-03 ENCOUNTER — Inpatient Hospital Stay (HOSPITAL_COMMUNITY): Payer: Self-pay

## 2019-02-03 ENCOUNTER — Inpatient Hospital Stay (HOSPITAL_COMMUNITY): Payer: Self-pay | Admitting: Occupational Therapy

## 2019-02-03 ENCOUNTER — Inpatient Hospital Stay (HOSPITAL_COMMUNITY): Payer: Self-pay | Admitting: *Deleted

## 2019-02-03 ENCOUNTER — Inpatient Hospital Stay (HOSPITAL_COMMUNITY): Payer: Self-pay | Admitting: Physical Therapy

## 2019-02-03 MED ORDER — BACLOFEN 10 MG PO TABS
10.0000 mg | ORAL_TABLET | Freq: Three times a day (TID) | ORAL | Status: DC | PRN
  Administered 2019-02-03 – 2019-02-26 (×15): 10 mg via ORAL
  Filled 2019-02-03 (×15): qty 1

## 2019-02-03 MED ORDER — TRAZODONE HCL 50 MG PO TABS
50.0000 mg | ORAL_TABLET | Freq: Every evening | ORAL | Status: DC | PRN
  Administered 2019-02-03: 50 mg via ORAL
  Filled 2019-02-03: qty 1

## 2019-02-03 MED ORDER — DEXAMETHASONE 2 MG PO TABS
1.0000 mg | ORAL_TABLET | Freq: Every day | ORAL | Status: AC
  Administered 2019-02-04 – 2019-02-05 (×2): 1 mg via ORAL
  Filled 2019-02-03 (×2): qty 1

## 2019-02-03 NOTE — Progress Notes (Signed)
Occupational Therapy Session Note  Patient Details  Name: Ellen Hill MRN: [id number] Date of Birth: 1971/11/09  Today's Date: 02/03/2019 OT Individual Time: 7616-0737 OT Individual Time Calculation (min): 45 min    Short Term Goals: Week 1:  OT Short Term Goal 1 (Week 1): Pt will tolerate sitting EOB/mat for 3 minutes for self care tasks.  OT Short Term Goal 2 (Week 1): Pt will perform UB self care with min A overall in order to reduce assistance with functional tasks.  OT Short Term Goal 3 (Week 1): Pt will demonstrate B UE strengthening HEP with supervision and use of paper handout as needed.  Skilled Therapeutic Interventions/Progress Updates:    Began session with pt in the bed and agreeable to OOB.  Maxisky utilized for transfer with total assist.  She was able to complete rolling side to side for placement of sling with mod facilitation using the rails for support  Once up in the wheelchair she requested to complete oral hygiene.  Therapist positioned her at the sink where she brushed her teeth with setup.  Pillows placed behind her back as well to help increase upright posture.  After completion of oral care therapist took her to the dayroom where she worked on UE strength and endurance with use of the ergonometer.  She completed 3 sets.  First set was for 2 mins on level 5 with RPMs maintained at 35-38.  Next 2 sets were with resistance increased at level 8.  The first of them peddling forward for 2 mins and the second peddling backwards for 4 mins.  She was able to maintain RPMs above 28 for both sets.  Oxygen sats 98% with HR increasing to 130 BPM post activity.  Finished session with return to the room and call button and phone placed in reach.    Therapy Documentation Precautions:  Precautions Precautions: Back, Fall Precaution Comments: obese. No brace needed Restrictions Weight Bearing Restrictions: No   Pain: Pain Assessment Pain Scale: Faces Faces Pain Scale: Hurts  a little bit Pain Type: Acute pain Pain Location: Pelvis Pain Orientation: Mid Pain Descriptors / Indicators: Discomfort Pain Onset: With Activity Pain Intervention(s): Repositioned    Therapy/Group: Individual Therapy  Xzaiver Vayda OTR/L  02/03/2019, 2:57 PM

## 2019-02-03 NOTE — Consult Note (Signed)
Neuropsychological Consultation   Patient:   Ellen Hill   DOB:   May 18, 1971  MR Number:  [id number]  Location:  [facility] [id number] [place] [address] Dept: [place]: [phone]           Date of Service:   02/03/2019  Start Time:   9 AM End Time:   10 AM  Provider/Observer:  Ilean Skill, Psy.D.       Clinical Neuropsychologist       Billing Code/Service: [phone]  Chief Complaint:    Ellen Hill is a 48 year old female with history of migraine headaches, obesity with BMI 64.76.  Presented 01/17/2019 with gait instability bilateral lower extremity weakness x2.5 months to the point of being essentially nonambulatory.  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  Patient underwent surgery with Dr. Annette Stable.  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    Reason for Service:  Patient referred for neuropsychological consultation due to coping and adjustment issues.  Below is the HPI for the current admission.    AST:Ellen Hill is a 48 year old right-handed female with history of migraine headaches, obesity with BMI 64.76. Per chart review patient lives with her sister 32 year old daughter. One level apartment with multiple steps to entry. 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 Sister works during the day. She has a neighbor friend who planned to assist she would stay with on discharge with accessible home. Presented 01/17/2019 with gait instability bilateral lower extremity weakness 2-1/2 months to the point she was essentially nonambulatory. 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 Patient underwent T9, T10, T11-12 and partial L1 decompressive laminotomies 01/19/2019 per Dr. Annette Stable. No back brace required. Surgical PCR screening positive for contact precautions. Hospital course ongoing pain management. Decadron protocol as indicated. Elevated WBC 20,500 decreased to 17,60monitored closely while on steroid therapy with urine culture completed 01/27/2019 greater 100,000 Proteus and 50,000 Escherichia coli andcurrently maintained on Bactrim. Bouts of diarrhea noted patient initially did have a a rectal tube in place and since has been removed.Therapy evaluations completed with recommendations of physical medicine rehabilitation consult. Patient was admitted for a comprehensive rehabilitation program.  Current Status:  The patient reports some increase in anxiety and stress with current deficits and worry about status going forward.  Motivated to work in therapies but is hard for her to do and she was in poor physical condition prior to her cord compression myelopathy.    Behavioral Observation: Ellen Hill  presents as a 48 y.o.-year-old Right African American Female who appeared her stated age. her dress was Appropriate and she was Well Groomed and her manners were Appropriate to the situation.  her participation was indicative of Appropriate and Attentive behaviors.  There were any physical disabilities noted.  she displayed an appropriate level of cooperation and motivation.     Interactions:    Active Appropriate and Attentive  Attention:   within normal limits and attention span and concentration were age appropriate  Memory:   within normal limits; recent and remote memory intact  Visuo-spatial:  not examined  Speech (Volume):  low  Speech:  normal; normal  Thought Process:  Coherent and Relevant  Though Content:  WNL; not suicidal and not homicidal  Orientation:   person, place, time/date and  situation  Judgment:   Good  Planning:   Fair  Affect:    Anxious  Mood:    Anxious  Insight:   Good  Intelligence:   normal  Medical History:   Past Medical History:  Diagnosis Date  . Abnormal Pap smear    history  . Anemia   . ASCUS (atypical squamous cells of undetermined significance) on Pap smear 2008   At 36wk of pregnancy ; colpo  . Asthma    rarely uses inhaler (seasonal)  . 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 Fibroids   . H/O candidiasis   . H/O fatigue 08/2007  . H/O varicella   . Headache(784.0)    migraines   . Heart murmur    dx child - no problems as adult  . Hx: UTI (urinary tract infection) 07/29/07  . Increased BMI   . Irregular bleeding   . Migraines   . Obesity 02/13/12  . Pelvic pain 02/13/12  . Postpartum anemia 08/18/07  . 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 after loss of parents, is fine  . Vaginal Pap smear, abnormal    f/u wnl     Psychiatric History:  Patient denies prior psychiatric history.  Family Med/Psych History:  Family History  Problem Relation Age of Onset  . Hypertension Mother   . Cancer Mother        Breast  . Breast cancer Mother   . Hypertension Father   . Cancer Father        Prostate  . Heart attack Father   . Hypertension Sister   . Diabetes Maternal Aunt     Risk of Suicide/Violence: low Patient denies Si or HI.    Impression/DX:  Ellen Hill is a 48 year old female with history of migraine headaches, obesity with BMI 64.76.  Presented 01/17/2019 with gait instability bilateral lower extremity weakness x2.5 months to the point of being essentially nonambulatory.  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  Patient underwent surgery with Dr. Annette Stable.  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   The patient reports some increase in anxiety and  stress with current deficits and worry about status going forward.  Motivated to work in therapies but is hard for her to do and she was in poor physical condition prior to her cord compression myelopathy.     Disposition/Plan:  Worked on coping and adjustment issues.           Electronically Signed   _______________________ Ilean Skill, Psy.D.

## 2019-02-03 NOTE — Progress Notes (Signed)
Orthopedic Tech Progress Note Patient Details:  Ellen Hill [facility] 09/19/1971 [id number]  Patient ID: [name], female   DOB: [birth year], 48 y.o.   MRN: [id number]   [place] Pink 02/03/2019, 9:29 AMCalled [facility] for left Prafo brace.

## 2019-02-03 NOTE — Progress Notes (Signed)
[facility] PHYSICAL MEDICINE & REHABILITATION PROGRESS NOTE  Subjective/Complaints: Didn't sleep all that well. Couldn't get comfortable. Had some spasms in left leg too  ROS: Patient denies fever, rash, sore throat, blurred vision, nausea, vomiting, diarrhea, cough, shortness of breath or chest pain, joint or back pain, headache, or mood change.   Objective: Vital Signs: Blood pressure 128/87, pulse (!) 104, temperature 98.7 F (37.1 C), temperature source Oral, resp. rate 18, height 5\' 6"  (1.676 m), SpO2 100 %. No results found. Recent Labs    02/01/19 0521  WBC 15.7*  HGB 11.2*  HCT 35.3*  PLT 200   Recent Labs    02/01/19 0521  NA 134*  K 4.7  CL 105  CO2 20*  GLUCOSE 131*  BUN 15  CREATININE 0.69  CALCIUM 8.6*    Physical Exam: BP 128/87 (BP Location: Right Arm)   Pulse (!) 104   Temp 98.7 F (37.1 C) (Oral)   Resp 18   Ht 5\' 6"  (1.676 m)   SpO2 100%   BMI 64.76 kg/m  Constitutional: No distress . Vital signs reviewed. obese HEENT: EOMI, oral membranes moist Neck: supple Cardiovascular: RRR without murmur. No JVD    Respiratory: CTA Bilaterally without wheezes or rales. Normal effort    GI: BS +, non-tender, non-distended  Musc: No edema or tenderness in extremities. Neurological: She isalert and oriented.   Motor: Bilateral UE 5/5.  RLE HF, KE 2/5, 3/5 ADF, stable  LLE: 0-tr/5 prox to distal, heel cord tight Senses pain and gross touch in both legs Skin: Skin iswarm.  Psychiatric: pleasant   Assessment/Plan: 1. 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  Physiatrist is providing close team supervision and 24 hour management of active medical problems listed below.  Physiatrist and rehab team continue to assess barriers to discharge/monitor patient progress toward functional and medical goals  Care  Tool:  Bathing    Body parts bathed by patient: Right arm, Left arm, Chest, Abdomen, Face   Body parts bathed by helper: Front perineal area, Buttocks, Right upper leg, Left upper leg, Right lower leg, Left lower leg     Bathing assist Assist Level: 2 Helpers     Upper Body Dressing/Undressing Upper body dressing   What is the patient wearing?: Pull over shirt    Upper body assist Assist Level: Maximal Assistance - Patient 25 - 49%    Lower Body Dressing/Undressing Lower body dressing      What is the patient wearing?: Incontinence brief, Pants     Lower body assist Assist for lower body dressing: Dependent - Patient 0%     Toileting Toileting    Toileting assist Assist for toileting: 2 Helpers     Transfers Chair/bed transfer  Transfers assist     Chair/bed transfer assist level: Dependent - mechanical lift     Locomotion Ambulation   Ambulation assist   Ambulation activity did not occur: Safety/medical concerns          Walk 10 feet activity   Assist  Walk 10 feet activity did not occur: Safety/medical concerns        Walk 50 feet activity   Assist Walk 50 feet with 2 turns activity did not occur: Safety/medical concerns         Walk 150 feet activity   Assist Walk 150 feet activity did not occur: Safety/medical concerns  Walk 10 feet on uneven surface  activity   Assist Walk 10 feet on uneven surfaces activity did not occur: Safety/medical concerns         Wheelchair     Assist Will patient use wheelchair at discharge?: Yes Type of Wheelchair: (TBD) Wheelchair activity did not occur: Safety/medical concerns         Wheelchair 50 feet with 2 turns activity    Assist    Wheelchair 50 feet with 2 turns activity did not occur: Safety/medical concerns       Wheelchair 150 feet activity     Assist Wheelchair 150 feet activity did not occur: Safety/medical concerns          Medical  Problem List and Plan: 1.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 No back brace required  Continue CIR therapies/ PT, OT  -PRAFO LLE 2. DVT Prophylaxis/Anticoagulation: SCDs.   Vascular study limited, but negative 3. Pain Management:Neurontin 300 mg 3 times a day,   hydrocodone as needed  -change flexeril to baclofen 4. Mood:Provide emotional support 5. Neuropsych: This patientiscapable of making decisions on herown behalf. 6. Skin/Wound Care:Routine skin checks 7. Fluids/Electrolytes/Nutrition:Routine ins and outs  Continue to encourage PO 8.Proteus/Escherichia coli UTI.   Bactrim started on 01/27/2019, ends today 2/12 9.  Super obesity. Latest BMI 64.79. Dietary follow-up 10. Neurogenic bowel and bladder.    PVRs showing retention  Flomax started on 2/8  Bowel meds increased on 2/9  -had two small/medium BM yesterday after enema   -continue senna-s at night, dulcolax supp in am  -increased miralax to bid 11.  Leukocytosis  WBCs 17.7 on 2/6---> 15.7 2/10  -likely related to UTI and steroids    Afebrile  -weaning steroids off  -recheck CBC 2/13  Cont to monitor 12.?  Anemia of chronic disease  Hemoglobin 11.2 2/10      LOS: 5 days A FACE TO FACE EVALUATION WAS PERFORMED  [name] 02/03/2019, 8:59 AM

## 2019-02-03 NOTE — Progress Notes (Signed)
Occupational Therapy Session Note  Patient Details  Name: Ellen Hill MRN: [id number] Date of Birth: 1971/06/01  Today's Date: 02/03/2019 OT Individual Time: 0375-4360 OT Individual Time Calculation (min): 42 min    Short Term Goals: Week 1:  OT Short Term Goal 1 (Week 1): Pt will tolerate sitting EOB/mat for 3 minutes for self care tasks.  OT Short Term Goal 2 (Week 1): Pt will perform UB self care with min A overall in order to reduce assistance with functional tasks.  OT Short Term Goal 3 (Week 1): Pt will demonstrate B UE strengthening HEP with supervision and use of paper handout as needed.  Skilled Therapeutic Interventions/Progress Updates:    1:1. Pt received in bed, Pt dons shirt with A to pull shirt down back. Pt transfers into chair via maxi sky with MAX A for rolling B directions to place sling. Pt grooms at sink with set up. Pt completes 3x30 ball tosses (chest, bounce and overhead passes) for BUE strengthening seated in w/c. Pt requesting to 'see how strong my legs are." Pt able to complete 15-20* ROM of LAQ. Exited session with pt seated in w/c with call light in reach and all need smet  Therapy Documentation Precautions:  Precautions Precautions: Back, Fall Precaution Comments: obese. No brace needed Restrictions Weight Bearing Restrictions: No General:   Vital Signs:  Pain: Pain Assessment Pain Scale: 0-10 Pain Score: 7  Pain Type: Acute pain Pain Location: Leg Pain Orientation: Right;Left Pain Descriptors / Indicators: Aching Pain Frequency: Intermittent Patients Stated Pain Goal: 0 Pain Intervention(s): Medication (See eMAR) ADL:   Vision   Perception    Praxis   Exercises:   Other Treatments:     Therapy/Group: Individual Therapy  [name] 02/03/2019, 9:01 AM

## 2019-02-03 NOTE — Progress Notes (Signed)
Physical Therapy Session Note  Patient Details  Name: Ellen Hill MRN: [id number] Date of Birth: [birth year]  Today's Date: 02/03/2019 PT Individual Time: 9628-3662 PT Individual Time Calculation (min): 40 min   Short Term Goals: Week 1:  PT Short Term Goal 1 (Week 1): Pt will perform rolling in bed with assist x 1 consistently PT Short Term Goal 2 (Week 1): Pt will tolerate sitting EOB x 10 min PT Short Term Goal 3 (Week 1): Pt will initiate w/c mobility  Skilled Therapeutic Interventions/Progress Updates:  Pt received in w/c reporting significant fatigue following all of her therapy sessions today. Pt reports 5/10 back pain but declines request for pain medication & rest breaks provided during session. Transported pt to/from gym via w/c dependent assist. Pt transfers w/c<>mat table and w/c<>bed with maxisky lift. Pt sat EOM with steps underneath BLE for BLE support and engaged in reaching for then tossing beanbags with 1UE support throughout task (pt prefers to support trunk with LUE & reach with RUE) with max assist to maintain upright posture. Task focused on core/trunk strengthening, sitting balance, and midline orientation. Pt tearful at one moment, reporting feeling "overwhelmed" with therapist providing encouragement, distraction & music therapy with pt's mood improving as session progressed. At end of session pt performed rolling L<>R with assistance and cuing for LE placement for sling removal. Pt left in bed with needs at hand.  Therapy Documentation Precautions:  Precautions Precautions: Back, Fall Precaution Comments: obese. No brace needed Restrictions Weight Bearing Restrictions: No    Therapy/Group: Individual Therapy  [name] 02/03/2019, 3:35 PM

## 2019-02-03 NOTE — Progress Notes (Signed)
Occupational Therapy Session Note  Patient Details  Name: Ellen Hill MRN: [id number] Date of Birth: 07/13/1971  Today's Date: 02/03/2019 OT Individual Time: 2863-8177 OT Individual Time Calculation (min): 40 min    Short Term Goals: Week 1:  OT Short Term Goal 1 (Week 1): Pt will tolerate sitting EOB/mat for 3 minutes for self care tasks.  OT Short Term Goal 2 (Week 1): Pt will perform UB self care with min A overall in order to reduce assistance with functional tasks.  OT Short Term Goal 3 (Week 1): Pt will demonstrate B UE strengthening HEP with supervision and use of paper handout as needed.  Skilled Therapeutic Interventions/Progress Updates:    Upon entering the room, pt supine in bed with c/o feeling very tired. She reports not sleeping well. OT wrapping B LEs with ACE wrap and pt rolling L <> R with max A and use of bed rails x 6 for LB clothing management. OT discussed pt's progress this week and continued to encourage pt towards goals. Pt remains very motivated. Pt remained in bed with call bell and all needed items within reach.   Therapy Documentation Precautions:  Precautions Precautions: Back, Fall Precaution Comments: obese. No brace needed Restrictions Weight Bearing Restrictions: No General:   Vital Signs:  Pain: Pain Assessment Pain Scale: 0-10 Pain Score: 7  Pain Type: Acute pain Pain Location: Leg Pain Orientation: Right;Left Pain Descriptors / Indicators: Aching Pain Frequency: Intermittent Patients Stated Pain Goal: 0 Pain Intervention(s): Medication (See eMAR)   Therapy/Group: Individual Therapy  [name] 02/03/2019, 8:51 AM

## 2019-02-04 ENCOUNTER — Inpatient Hospital Stay (HOSPITAL_COMMUNITY): Payer: Self-pay | Admitting: Physical Therapy

## 2019-02-04 ENCOUNTER — Inpatient Hospital Stay (HOSPITAL_COMMUNITY): Payer: Self-pay | Admitting: Occupational Therapy

## 2019-02-04 ENCOUNTER — Inpatient Hospital Stay (HOSPITAL_COMMUNITY): Payer: Self-pay

## 2019-02-04 LAB — CBC
HCT: 41.7 % (ref 36.0–46.0)
Hemoglobin: 12.7 g/dL (ref 12.0–15.0)
MCH: 23.3 pg — ABNORMAL LOW (ref 26.0–34.0)
MCHC: 30.5 g/dL (ref 30.0–36.0)
MCV: 76.4 fL — ABNORMAL LOW (ref 80.0–100.0)
Platelets: 180 10*3/uL (ref 150–400)
RBC: 5.46 MIL/uL — ABNORMAL HIGH (ref 3.87–5.11)
RDW: 18.2 % — ABNORMAL HIGH (ref 11.5–15.5)
WBC: 12.4 10*3/uL — ABNORMAL HIGH (ref 4.0–10.5)
nRBC: 0 % (ref 0.0–0.2)

## 2019-02-04 MED ORDER — TRAZODONE HCL 50 MG PO TABS
50.0000 mg | ORAL_TABLET | Freq: Every day | ORAL | Status: DC
  Administered 2019-02-04 – 2019-03-04 (×29): 50 mg via ORAL
  Filled 2019-02-04 (×29): qty 1

## 2019-02-04 NOTE — Progress Notes (Signed)
Physical Therapy Session Note  Patient Details  Name: Ellen Hill MRN: [id number] Date of Birth: 14-May-1971  Today's Date: 02/04/2019 PT Individual Time: 0623-7628; 1415-1530 PT Individual Time Calculation (min): 75 min and 75 min  Short Term Goals: Week 1:  PT Short Term Goal 1 (Week 1): Pt will perform rolling in bed with assist x 1 consistently PT Short Term Goal 2 (Week 1): Pt will tolerate sitting EOB x 10 min PT Short Term Goal 3 (Week 1): Pt will initiate w/c mobility  Skilled Therapeutic Interventions/Progress Updates:    Session 1:  Pt received seated in bed, reports 8/10 pain in back at rest, RN provides pain medication during therapy session. Rolling L/R with mod A and use of bedrails, dependent for brief change and pericare due to urinary incontinence. Dependent to don BLE ACE wrap and to don pants. Min A to don shirt while seated in bed. Rolling L/R with mod A and use of bedrails for maxisky sling placement. Maxisky transfer bed to w/c. Dependent transfer via w/c to therapy gym. Maxisky transfer to therapy mat in gym. Session focus on sitting balance EOM, pt requires max A posteriorly to maintain sitting balance, focus on decreased UE support in sitting, 6" step under BLE for support. Pt exhibits fair tolerance for sitting balance this date due to pain in back. Maxisky transfer back to w/c. Pt left seated in w/c in room with needs in reach.  Session 2: Pt received seated in w/c in room, agreeable to PT session. No complaints of pain. Manual w/c propulsion x 30 ft with use of BUE, min A due to difficulty reaching wheel rims due to body habitus. Seated RLE AAROM hip flex, knee flex/ext x 10 reps each; LLE PROM x 10 reps. Pt continues to exhibit no voluntary motor control of LLE. Seated forward leans x 5 reps with min A progressing to CGA with BUE resting on knees. Pt is able to maintain upright sitting with no UE support x 45 sec, focus on core muscle activation. Attempt to have  pt perform w/c pushup for pressure relief, pt unable to unweight herself 2/2 body habitus. Lateral leans L/R for pressure relief. Education with patient about pressure relief techniques and skin care management. Maxisky transfer back to bed. Rolling L/R with mod A and use of bedrails for dependent brief change due to urinary incontinence. Pt left seated in bed with needs in reach.  Therapy Documentation Precautions:  Precautions Precautions: Back, Fall Precaution Comments: obese. No brace needed Restrictions Weight Bearing Restrictions: No   Therapy/Group: Individual Therapy   Excell Seltzer, PT, DPT  02/04/2019, 10:12 AM

## 2019-02-04 NOTE — Progress Notes (Signed)
Occupational Therapy Session Note  Patient Details  Name: Ellen Hill MRN: [id number] Date of Birth: 1971-04-24  Today's Date: 02/04/2019 OT Individual Time: 1245-1330 (Make-up time) OT Individual Time Calculation (min): 45 min    Short Term Goals: Week 1:  OT Short Term Goal 1 (Week 1): Pt will tolerate sitting EOB/mat for 3 minutes for self care tasks.  OT Short Term Goal 2 (Week 1): Pt will perform UB self care with min A overall in order to reduce assistance with functional tasks.  OT Short Term Goal 3 (Week 1): Pt will demonstrate B UE strengthening HEP with supervision and use of paper handout as needed.  Skilled Therapeutic Interventions/Progress Updates:    Pt seen for OT make-up session focusing on functional mobility. Pt in supine upon arrival, agreeable to tx session. RN aware of pain during session, 6/10, agreeable to cont as able.  Pt requesting to void bladder. Mod-max A with +2 for bed mobility for clothing management and placing of sling. Used maxi-sky to transfer pt to [facility]. Pt able to void while suspended in Maxi-sky over [facility]. Returned to bed for hygiene to be completed and clothing management total A +2. Pt then transitioned via lift back to w/c at end of session. Pt left seated in w/c at end of session, set-up with meal tray and all needs in reach.   Therapy Documentation Precautions:  Precautions Precautions: Back, Fall Precaution Comments: obese. No brace needed Restrictions Weight Bearing Restrictions: No   Therapy/Group: Individual Therapy  [name] 02/04/2019, 10:00 AM

## 2019-02-04 NOTE — Progress Notes (Signed)
Occupational Therapy Session Note  Patient Details  Name: Ellen Hill MRN: [id number] Date of Birth: 16-Mar-1971  Today's Date: 02/04/2019 OT Individual Time: 1030-1058 OT Individual Time Calculation (min): 28 min    Short Term Goals: Week 1:  OT Short Term Goal 1 (Week 1): Pt will tolerate sitting EOB/mat for 3 minutes for self care tasks.  OT Short Term Goal 2 (Week 1): Pt will perform UB self care with min A overall in order to reduce assistance with functional tasks.  OT Short Term Goal 3 (Week 1): Pt will demonstrate B UE strengthening HEP with supervision and use of paper handout as needed.  Skilled Therapeutic Interventions/Progress Updates:    1;1. Pt received in w/c. Pt requesting pain medication at end of session and RN delivers. Pt completes wii bowling activity with 2# wrist weight on LUE for UE strengthening pt tolerated well and smiled during game. Exited session with pt seated in w/c with RN in room and call light in reach.  Therapy Documentation Precautions:  Precautions Precautions: Back, Fall Precaution Comments: obese. No brace needed Restrictions Weight Bearing Restrictions: No General:     Therapy/Group: Individual Therapy  [name] 02/04/2019, 11:06 AM

## 2019-02-04 NOTE — Progress Notes (Signed)
[place] PHYSICAL MEDICINE & REHABILITATION PROGRESS NOTE  Subjective/Complaints: Slept better. Left heel cord less tender. Had some concerns about her back and feeling like it was "coming apart" near op site  ROS: Patient denies fever, rash, sore throat, blurred vision, nausea, vomiting, diarrhea, cough, shortness of breath or chest pain,  headache, or mood change. .   Objective: Vital Signs: Blood pressure (!) 138/91, pulse (!) 104, temperature 98 F (36.7 C), resp. rate 18, height 5\' 6"  (1.676 m), SpO2 100 %. No results found. Recent Labs    02/04/19 0538  WBC 12.4*  HGB 12.7  HCT 41.7  PLT 180   No results for input(s): NA, K, CL, CO2, GLUCOSE, BUN, CREATININE, CALCIUM in the last 72 hours.  Physical Exam: BP (!) 138/91 (BP Location: Right Arm)   Pulse (!) 104   Temp 98 F (36.7 C)   Resp 18   Ht 5\' 6"  (1.676 m)   SpO2 100%   BMI 64.76 kg/m  Constitutional: No distress . Vital signs reviewed. HEENT: EOMI, oral membranes moist Neck: supple Cardiovascular: RRR without murmur. No JVD    Respiratory: CTA Bilaterally without wheezes or rales. Normal effort    GI: BS +, non-tender, non-distended  Musc: No edema or tenderness in extremities. Neurological: She isalert and oriented.   Motor: Bilateral UE 5/5.  RLE HF, KE 2/5, 3/5 ADF, stable  LLE: 0-tr/5 prox to distal, heel cord tight Senses pain and gross touch in both legs Skin: Skin iswarm. Back incision clean and intact,dry, honeycomb dressing Psychiatric: pleasant   Assessment/Plan: 1. 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  Physiatrist is providing close team supervision and 24 hour management of active medical problems listed below.  Physiatrist and rehab team continue to assess barriers to discharge/monitor patient progress toward functional and medical  goals  Care Tool:  Bathing    Body parts bathed by patient: Right arm, Left arm, Chest, Abdomen, Face   Body parts bathed by helper: Front perineal area, Buttocks, Right upper leg, Left upper leg, Right lower leg, Left lower leg     Bathing assist Assist Level: 2 Helpers     Upper Body Dressing/Undressing Upper body dressing   What is the patient wearing?: Pull over shirt    Upper body assist Assist Level: Maximal Assistance - Patient 25 - 49%    Lower Body Dressing/Undressing Lower body dressing      What is the patient wearing?: Incontinence brief, Pants     Lower body assist Assist for lower body dressing: Dependent - Patient 0%     Toileting Toileting    Toileting assist Assist for toileting: 2 Helpers     Transfers Chair/bed transfer  Transfers assist     Chair/bed transfer assist level: Dependent - mechanical lift     Locomotion Ambulation   Ambulation assist   Ambulation activity did not occur: Safety/medical concerns          Walk 10 feet activity   Assist  Walk 10 feet activity did not occur: Safety/medical concerns        Walk 50 feet activity   Assist Walk 50 feet with 2 turns activity did not occur: Safety/medical concerns         Walk 150 feet activity   Assist Walk 150 feet activity did not occur: Safety/medical concerns         Walk 10  feet on uneven surface  activity   Assist Walk 10 feet on uneven surfaces activity did not occur: Safety/medical concerns         Wheelchair     Assist Will patient use wheelchair at discharge?: Yes Type of Wheelchair: (TBD) Wheelchair activity did not occur: Safety/medical concerns         Wheelchair 50 feet with 2 turns activity    Assist    Wheelchair 50 feet with 2 turns activity did not occur: Safety/medical concerns       Wheelchair 150 feet activity     Assist Wheelchair 150 feet activity did not occur: Safety/medical concerns           Medical Problem List and Plan: 1.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 No back brace required  Continue CIR therapies/ PT, OT  -PRAFO LLE at HS 2. DVT Prophylaxis/Anticoagulation: SCDs.   Vascular study limited, but negative 3. Pain Management:Neurontin 300 mg 3 times a day,   hydrocodone as needed  -change flexeril to baclofen 4. Mood:Provide emotional support 5. Neuropsych: This patientiscapable of making decisions on herown behalf. 6. Skin/Wound Care:Routine skin checks 7. Fluids/Electrolytes/Nutrition:Routine ins and outs  Push PO 8.Proteus/Escherichia coli UTI.   Bactrim completed 2/12 9.  Super obesity. Latest BMI 64.79. Dietary follow-up 10. Neurogenic bowel and bladder.    incontinent  Flomax started on 2/8, peri-care ongoing  Bowel meds increased on 2/9  -stools more frequent now but soft   -continue senna-s at night, dulcolax supp in am  -maintain miralax to bid  -working toward an AM bowel program 11.  Leukocytosis  WBCs 17.7 on 2/6---> 15.7 2/10 --->12.4 2/13  -likely related to UTI and steroids    Afebrile  -decadron 1mg  daily ends tomorrow  -recheck CBC next week  Cont to monitor 12.?  Anemia of chronic disease  Hemoglobin 12.7 2/13 13. Insomnia: schedule trazodone      LOS: 6 days A FACE TO FACE EVALUATION WAS PERFORMED  [name] 02/04/2019, 9:04 AM

## 2019-02-04 NOTE — Patient Care Conference (Signed)
Inpatient RehabilitationTeam Conference and Plan of Care Update Date: 02/02/2019   Time: 2:00 PM    Patient Name: Ellen Hill [facility]      Medical Record Number: [id number]  Date of Birth: January 29, 1971 Sex: Female         Room/Bed: 4W14C/4W14C-01 Payor Info: Payor: [place] / Plan: [facility] PPO / Product Type: *No Product type* /    Admitting Diagnosis: Thoracic myelopathy incomplete sci  Admit Date/Time:  01/29/2019  1:39 PM Admission Comments: No comment available   Primary Diagnosis:  <principal problem not specified> Principal Problem: <principal problem not specified>  Patient Active Problem List   Diagnosis Date Noted  . Anemia of chronic disease   . Leukocytosis   . Neurogenic bowel   . Neurogenic bladder   . E. coli UTI   . Myelopathy ([place]) 01/29/2019  . 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 Lumbar back pain with radiculopathy affecting lower extremity 12/08/2018  . Dysmenorrhea 04/13/2012  . Class 3 severe obesity due to excess calories without serious comorbidity with body mass index (BMI) of 60.0 to 69.9 in adult ([place]) 04/13/2012  . Anemia 04/13/2012  . Fibroids 04/13/2012  . Menorrhagia 03/07/2012    Expected Discharge Date: Expected Discharge Date: (3-4 weeks)  Team Members Present: Physician leading conference: Dr. 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  morbidly obese  regulate bowels and bladder, improve pain control  see above   Bowel/Bladder   Mostly continent of bladder, q8 cath for no void. Continent of bowel LBM 2/10  fewer episodes of incontinence.   Assess bowel and bladder needs  qshift and PRN.   Swallow/Nutrition/ Hydration             ADL's   Total +2 for bed mobility, donning clothing, and use of maxisky for transfers  mod A LB dressing and toileting with min A for overall goals  sitting balance, self care retraining, strengthening, pt/family education   Mobility   max A x 1-2 for bed mobility, dependent for transfers via maxisky  min A overall  sitting balance, OOB tolerance   Communication             Safety/Cognition/ Behavioral Observations            Pain   c/o pain to BLE, relived with PRN narco, and scheduled gabapentin,  pain less than 3  assess pain management qshift and PRN   Skin   fissure to buttock crease healing  apply barrier cream after toileting or brief changes  assess skin qshift and PRN    Rehab Goals Patient on target to meet rehab goals: Yes Rehab Goals Revised: none - pt's first conference *See Care Plan and progress notes for long and short-term goals.     Barriers to Discharge  Current Status/Progress Possible Resolutions Date Resolved   Physician    Medical stability        ongoing regulation of caths/bowel regimen      Nursing                  PT  Inaccessible home environment;Decreased caregiver support;Medical stability;Home environment access/layout;Incontinence;Neurogenic Bowel & Bladder;Weight  OT                  SLP                SW                Discharge Planning/Teaching Needs:  Pt is planning to go to her friend's home that is one level and more w/c accessible, unless her sister can move them to a one level until on the first floor.  Family education to occur prior to pt's d/c.   Team Discussion:  Pt with incomplete spinal cord injury with paralysis.  Pt is incontinent of bowel and bladder.  Pt requires max A +1-2 for rolling in bed.  She is dependent for txs using the maxi-sky.  She can tolerate 45 degrees in stand up bed.  Pt is +2 with LB bathing and dressing and min guard for 15-30  minutes edge of mat.  Pt has min A w/c level goals with some mod A needed for ADLs.  Revisions to Treatment Plan:  none    Continued Need for Acute Rehabilitation Level of Care: The patient requires daily medical management by a physician with specialized training in physical medicine and rehabilitation for the following conditions: Daily direction of a multidisciplinary physical rehabilitation program to ensure safe treatment while eliciting the highest outcome that is of practical value to the patient.: Yes Daily medical management of patient stability for increased activity during participation in an intensive rehabilitation regime.: Yes Daily analysis of laboratory values and/or radiology reports with any subsequent need for medication adjustment of medical intervention for : Post surgical problems;Neurological problems;Urological problems   I attest that I was present, lead the team conference, and concur with the assessment and plan of the team.   Ellen Hill, Silvestre Mesi 02/04/2019, 2:32 PM

## 2019-02-04 NOTE — Progress Notes (Signed)
Social Work Patient ID: Ellen Hill, female   DOB: 28-Mar-1971, 48 y.o.   MRN: [id number]   CSW met with pt and her sister 02-03-19 to update them on team conference discussion and anticipated length of stay of about 3-4 weeks.  Pt and sister expressed understanding and appreciation of continued length of stay.  Pt's sister requested a letter from Dr. Naaman Plummer so that she could try to transfer to a first floor, level unit for sister, pt, and pt's dtr.  CSW provided this and also gave sister a social security disability application at her request.  Pt continues to be motivated to work hard on [employer]  CSW will continue to follow and support pt/family as needed.

## 2019-02-05 ENCOUNTER — Inpatient Hospital Stay (HOSPITAL_COMMUNITY): Payer: Self-pay | Admitting: Physical Therapy

## 2019-02-05 ENCOUNTER — Inpatient Hospital Stay (HOSPITAL_COMMUNITY): Payer: Self-pay | Admitting: Occupational Therapy

## 2019-02-05 DIAGNOSIS — M25562 Pain in left knee: Secondary | ICD-10-CM

## 2019-02-05 MED ORDER — DICLOFENAC SODIUM 1 % TD GEL
2.0000 g | Freq: Four times a day (QID) | TRANSDERMAL | Status: DC
  Administered 2019-02-05 – 2019-03-05 (×106): 2 g via TOPICAL
  Filled 2019-02-05 (×4): qty 100

## 2019-02-05 NOTE — Progress Notes (Signed)
Physical Therapy Session Note  Patient Details  Name: Ellen Hill MRN: [id number] Date of Birth: [birth year]  Today's Date: 02/05/2019 PT Individual Time: 1034-1100 PT Individual Time Calculation (min): 26 min   Short Term Goals: Week 1:  PT Short Term Goal 1 (Week 1): Pt will perform rolling in bed with assist x 1 consistently PT Short Term Goal 2 (Week 1): Pt will tolerate sitting EOB x 10 min PT Short Term Goal 3 (Week 1): Pt will initiate w/c mobility  Skilled Therapeutic Interventions/Progress Updates:   Pt in w/c and agreeable to therapy, pain as detailed below. Session focused on functional endurance and w/c mobility. Pt self-propelled w/c ~400' in total in 50-75' bouts w/ supervision using BUEs. Removed armrests for increased ease of self-propelling and to decrease energy expenditure. Brief rest breaks 2/2 fatigue and increased work of breathing. Verbal and visual cues throughout for w/c propulsion technique at turns and for steering. Returned to room and ended session in w/c, all needs in reach.   Therapy Documentation Precautions:  Precautions Precautions: Back, Fall Precaution Comments: obese. No brace needed Restrictions Weight Bearing Restrictions: No Pain: Pain Assessment Pain Scale: 0-10 Pain Score: 7  Pain Type: Chronic pain Pain Location: Leg Pain Orientation: Left Pain Descriptors / Indicators: Aching;Spasm Pain Frequency: Constant Pain Onset: On-going Patients Stated Pain Goal: 3 Pain Intervention(s): Medication (See eMAR)  Therapy/Group: Individual Therapy  Ellen Hill 02/05/2019, 11:01 AM

## 2019-02-05 NOTE — Progress Notes (Signed)
Physical Therapy Session Note  Patient Details  Name: Ellen Hill MRN: [id number] Date of Birth: 1971/09/16  Today's Date: 02/05/2019 PT Individual Time: 0900-1015 PT Individual Time Calculation (min): 75 min   Short Term Goals: Week 1:  PT Short Term Goal 1 (Week 1): Pt will perform rolling in bed with assist x 1 consistently PT Short Term Goal 2 (Week 1): Pt will tolerate sitting EOB x 10 min PT Short Term Goal 3 (Week 1): Pt will initiate w/c mobility  Skilled Therapeutic Interventions/Progress Updates:    Pt received seated in bed, agreeable to PT session. Pt has onset of L knee pain with mobility, decreases at rest, MD aware and to prescribe Voltarin gel for pain management. Rolling L/R with mod A and use of bedrails for dependent pericare and brief change following incontinence. Dependent to don pants, BLE ACE wrap, and shoes. Min A to don shirt while seated in bed. Rolling L/R with mod A for placement of maxisky sling. Maxisky transfer bed to w/c then w/c to therapy mat. Session focus on sitting balance EOM with min to mod A to prevent posterior lean. Focus on engaging core musculature with anterior leans reaching for w/c handrails for support, pt is able to hold upright seated position with BUE on w/c handrails and close CGA 5 x 45 sec. Seated alt UE reaching outside BOS and anteriorly with mod to max A for support posteriorly. Maxisky transfer back to chair. Pt left seated in w/c in room with needs in reach at end of session.  Therapy Documentation Precautions:  Precautions Precautions: Back, Fall Precaution Comments: obese. No brace needed Restrictions Weight Bearing Restrictions: No   Therapy/Group: Individual Therapy   Excell Seltzer, PT, DPT  02/05/2019, 11:28 AM

## 2019-02-05 NOTE — Progress Notes (Signed)
[place] PHYSICAL MEDICINE & REHABILITATION PROGRESS NOTE  Subjective/Complaints: Patient is complaining of left knee pain.  At rest this was not an issue.  When she has passive range of motion it feels like it needs to pop but becomes painful.  She has had no injuries to the left knee recently. She is with PT as well as with nursing who have not noted any swelling. ROS: Patient denies nausea, vomiting, diarrhea, cough, shortness of breath or chest pain,  Objective: Vital Signs: Blood pressure (!) 129/91, pulse (!) 104, temperature 97.7 F (36.5 C), temperature source Oral, resp. rate 12, height 5\' 6"  (1.676 m), SpO2 100 %. No results found. Recent Labs    02/04/19 0538  WBC 12.4*  HGB 12.7  HCT 41.7  PLT 180   No results for input(s): NA, K, CL, CO2, GLUCOSE, BUN, CREATININE, CALCIUM in the last 72 hours.  Physical Exam: BP (!) 129/91 (BP Location: Right Arm) Comment: rn notified   Pulse (!) 104 Comment: rn notified   Temp 97.7 F (36.5 C) (Oral)   Resp 12   Ht 5\' 6"  (1.676 m)   SpO2 100%   BMI 64.76 kg/m  Constitutional: No distress . Vital signs reviewed. HEENT: EOMI, oral membranes moist Neck: supple Cardiovascular: RRR without murmur. No JVD    Respiratory: CTA Bilaterally without wheezes or rales. Normal effort    GI: BS +, non-tender, non-distended  Musc: No edema or tenderness in extremities. Left knee has no evidence of effusion no erythema no tenderness palpation.  She does have some pain with attempted flexion passively of the knee.  She does have crepitus at the patella, negative apprehension sign Neurological: She isalert and oriented.   Motor: Bilateral UE 5/5.  RLE HF, KE 2/5, 3/5 ADF, stable  LLE: Trace toe flexion and extension left foot  Senses pain and gross touch in both legs Skin: Skin iswarm. Back incision clean and intact,dry, honeycomb dressing Psychiatric: pleasant   Assessment/Plan: 1. Functional deficits secondary to thoracic  myelopathy status post decompression which require 3+ hours per day of interdisciplinary therapy in a comprehensive inpatient rehab setting.  Physiatrist is providing close team supervision and 24 hour management of active medical problems listed below.  Physiatrist and rehab team continue to assess barriers to discharge/monitor patient progress toward functional and medical goals  Care Tool:  Bathing    Body parts bathed by patient: Right arm, Left arm, Chest, Abdomen, Face   Body parts bathed by helper: Front perineal area, Buttocks, Right upper leg, Left upper leg, Right lower leg, Left lower leg     Bathing assist Assist Level: 2 Helpers     Upper Body Dressing/Undressing Upper body dressing   What is the patient wearing?: Pull over shirt    Upper body assist Assist Level: Maximal Assistance - Patient 25 - 49%    Lower Body Dressing/Undressing Lower body dressing      What is the patient wearing?: Incontinence brief, Pants     Lower body assist Assist for lower body dressing: Dependent - Patient 0%     Toileting Toileting    Toileting assist Assist for toileting: 2 Helpers     Transfers Chair/bed transfer  Transfers assist     Chair/bed transfer assist level: Dependent - mechanical lift     Locomotion Ambulation   Ambulation assist   Ambulation activity did not occur: Safety/medical concerns          Walk 10 feet activity   Assist  Walk 10 feet activity did not occur: Safety/medical concerns        Walk 50 feet activity   Assist Walk 50 feet with 2 turns activity did not occur: Safety/medical concerns         Walk 150 feet activity   Assist Walk 150 feet activity did not occur: Safety/medical concerns         Walk 10 feet on uneven surface  activity   Assist Walk 10 feet on uneven surfaces activity did not occur: Safety/medical concerns         Wheelchair     Assist Will patient use wheelchair at discharge?:  Yes Type of Wheelchair: (TBD) Wheelchair activity did not occur: Safety/medical concerns         Wheelchair 50 feet with 2 turns activity    Assist    Wheelchair 50 feet with 2 turns activity did not occur: Safety/medical concerns       Wheelchair 150 feet activity     Assist Wheelchair 150 feet activity did not occur: Safety/medical concerns          Medical Problem List and Plan: 1.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 No back brace required  Continue CIR therapies/ PT, OT  -PRAFO LLE at HS 2. DVT Prophylaxis/Anticoagulation: SCDs.   Vascular study limited, but negative 3. Pain Management:Neurontin 300 mg 3 times a day,   hydrocodone as needed  -change flexeril to baclofen Left knee pain likely patellofemoral will start Voltaren gel 4. Mood:Provide emotional support 5. Neuropsych: This patientiscapable of making decisions on herown behalf. 6. Skin/Wound Care:Routine skin checks 7. Fluids/Electrolytes/Nutrition:Routine ins and outs  Push PO 8.Proteus/Escherichia coli UTI.   Bactrim completed 2/12 9.  Super obesity. Latest BMI 64.79. Dietary follow-up 10. Neurogenic bowel and bladder.    incontinent  Flomax started on 2/8, peri-care ongoing  Bowel meds increased on 2/9  -stools more frequent now but soft   -continue senna-s at night, dulcolax supp in am  -maintain miralax to bid  -working toward an AM bowel program 11.  Leukocytosis  WBCs 17.7 on 2/6---> 15.7 2/10 --->12.4 2/13  -likely related to UTI and steroids    Afebrile  -decadron 1mg  daily ends today  -recheck CBC next week  Cont to monitor 12.?  Anemia of chronic disease  Hemoglobin 12.7 2/13 13. Insomnia: schedule trazodone      LOS: 7 days A FACE TO FACE EVALUATION WAS PERFORMED  Charlett Blake 02/05/2019, 9:06 AM

## 2019-02-05 NOTE — Progress Notes (Signed)
Occupational Therapy Session Note  Patient Details  Name: Ellen Hill MRN: [id number] Date of Birth: 26-May-1971  Today's Date: 02/05/2019 OT Individual Time: 8182-9937 and 1333-1430 OT Individual Time Calculation (min): 27 min and 57 min  Short Term Goals: Week 1:  OT Short Term Goal 1 (Week 1): Pt will tolerate sitting EOB/mat for 3 minutes for self care tasks.  OT Short Term Goal 2 (Week 1): Pt will perform UB self care with min A overall in order to reduce assistance with functional tasks.  OT Short Term Goal 3 (Week 1): Pt will demonstrate B UE strengthening HEP with supervision and use of paper handout as needed.  Skilled Therapeutic Interventions/Progress Updates:    Pt greeted in w/c and reported pain was manageable for tx. Worked on UB strengthening with having pt self propel w/c to dayroom. Discussed technique for activating core to improve trunk control/sitting balance. Worked on core by having pt weight shift forward and lean Lt<Rt to wash table. Pt able to maintain unsupported sitting position with supervision for 15-20 second windows before needing rest. Afterwards she was escorted back to room via w/c and left with all needs and lunch set up.    2nd Session 1:1 tx (57 min) Pt greeted in w/c and pain manageable for tx. Wanting to have some "toilet time" for B+B void. Maxi Sky transfer with 2 assist completed to bed, where pants and brief were doffed. Pt able to assist with lowering pants in the front. Maxi Sky transfer completed to [facility], and pt sat on BSC while still in sling. With increased time, pt able to void bladder! She reported it made her day! Pt transferred back to bed in manner as written above. 2 assist for bed mobility and hygiene. Pt actively assisting with rolling Rt>Lt using bedrails. Also able to assist with frontal perihygiene with HOB elevated and help with LE positioning. With UEs placed on headboard, pt assisted with boosting herself up along with 2 helpers. Pt  opted to keep LB clothing removed to rest. ACE wraps doffed and Lt PRAFO donned. She was left comfortably in bed with all needs within reach.    After consultation with RN, updated safety plan so that RN staff can assist pt with OOB toileting.   Therapy Documentation Precautions:  Precautions Precautions: Back, Fall Precaution Comments: obese. No brace needed Restrictions Weight Bearing Restrictions: No Pain Assessment Pain Scale: 0-10 Pain Score: 4  Pain Type: Chronic pain Pain Location: Leg Pain Orientation: Left Pain Descriptors / Indicators: Aching;Spasm Pain Frequency: Constant Pain Onset: On-going Patients Stated Pain Goal: 3 Pain Intervention(s): Medication (See eMAR) ADL:        Therapy/Group: Individual Therapy  Kingston Shawgo A Gabrielle Mester 02/05/2019, 1:02 PM

## 2019-02-06 ENCOUNTER — Inpatient Hospital Stay (HOSPITAL_COMMUNITY): Payer: Self-pay | Admitting: Occupational Therapy

## 2019-02-06 NOTE — Progress Notes (Signed)
[place] PHYSICAL MEDICINE & REHABILITATION PROGRESS NOTE  Subjective/Complaints: Pt lying in bed. No new complaints. Left knee responded to voltaren gel. Did well with a transfer to use bathroom this morning, very pleased about that  ROS: Patient denies fever, rash, sore throat, blurred vision, nausea, vomiting, diarrhea, cough, shortness of breath or chest pain, joint or back pain, headache, or mood change.   Objective: Vital Signs: Blood pressure 127/80, pulse (!) 108, temperature 98.6 F (37 C), resp. rate 18, height 5\' 6"  (1.676 m), SpO2 100 %. No results found. Recent Labs    02/04/19 0538  WBC 12.4*  HGB 12.7  HCT 41.7  PLT 180   No results for input(s): NA, K, CL, CO2, GLUCOSE, BUN, CREATININE, CALCIUM in the last 72 hours.  Physical Exam: BP 127/80 (BP Location: Right Wrist)   Pulse (!) 108   Temp 98.6 F (37 C)   Resp 18   Ht 5\' 6"  (1.676 m)   SpO2 100%   BMI 64.76 kg/m  Constitutional: No distress . Vital signs reviewed. HEENT: EOMI, oral membranes moist Neck: supple Cardiovascular: RRR without murmur. No JVD    Respiratory: CTA Bilaterally without wheezes or rales. Normal effort    GI: BS +, non-tender, non-distended  Musc: mild tenderness left knee with PROM Neurological: She isalert and oriented.   Motor: Bilateral UE 5/5.  RLE HF, KE 2/5, 3/5 ADF.  LLE: Trace toe flexion and extension left foot--without change Senses pain and gross touch in both legs Skin: Skin iswarm. Back incision clean and intact,dry, honeycomb dressing Psychiatric: pleasant   Assessment/Plan: 1. 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  Physiatrist is providing close team supervision and 24 hour management of active medical problems listed below.  Physiatrist and rehab team continue to assess barriers to discharge/monitor patient  progress toward functional and medical goals  Care Tool:  Bathing    Body parts bathed by patient: Right arm, Left arm, Chest, Abdomen, Face   Body parts bathed by helper: Front perineal area, Buttocks, Right upper leg, Left upper leg, Right lower leg, Left lower leg     Bathing assist Assist Level: 2 Helpers     Upper Body Dressing/Undressing Upper body dressing   What is the patient wearing?: Pull over shirt    Upper body assist Assist Level: Maximal Assistance - Patient 25 - 49%    Lower Body Dressing/Undressing Lower body dressing      What is the patient wearing?: Incontinence brief, Pants     Lower body assist Assist for lower body dressing: Dependent - Patient 0%     Toileting Toileting    Toileting assist Assist for toileting: 2 Helpers     Transfers Chair/bed transfer  Transfers assist     Chair/bed transfer assist level: Dependent - mechanical lift     Locomotion Ambulation   Ambulation assist   Ambulation activity did not occur: Safety/medical concerns          Walk 10 feet activity   Assist  Walk 10 feet activity did not occur: Safety/medical concerns        Walk 50 feet activity   Assist Walk 50 feet with 2 turns activity did not occur: Safety/medical concerns         Walk 150 feet activity   Assist Walk 150 feet activity did not occur: Safety/medical concerns  Walk 10 feet on uneven surface  activity   Assist Walk 10 feet on uneven surfaces activity did not occur: Safety/medical concerns         Wheelchair     Assist Will patient use wheelchair at discharge?: Yes Type of Wheelchair: (TBD) Wheelchair activity did not occur: Safety/medical concerns  Wheelchair assist level: Supervision/Verbal cueing Max wheelchair distance: 79'    Wheelchair 50 feet with 2 turns activity    Assist    Wheelchair 50 feet with 2 turns activity did not occur: Safety/medical concerns   Assist Level:  Supervision/Verbal cueing   Wheelchair 150 feet activity     Assist Wheelchair 150 feet activity did not occur: Safety/medical concerns          Medical Problem List and Plan: 1.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 No back brace required  Continue CIR therapies/ PT, OT  -PRAFO LLE at HS 2. DVT Prophylaxis/Anticoagulation: SCDs.   Vascular study limited, but negative 3. Pain Management:Neurontin 300 mg 3 times a day,   hydrocodone as needed  -change flexeril to baclofen  Left knee pain likely patellofemoral: Voltaren gel helpful 4. Mood:Provide emotional support 5. Neuropsych: This patientiscapable of making decisions on herown behalf. 6. Skin/Wound Care:Routine skin checks 7. Fluids/Electrolytes/Nutrition:Routine ins and outs  Push PO 8.Proteus/Escherichia coli UTI.   Bactrim completed 2/12 9.  Super obesity. Latest BMI 64.79. Dietary follow-up 10. Neurogenic bowel and bladder.    incontinent  Flomax started on 2/8, peri-care ongoing  Bowel meds increased on 2/9  -stools more frequent now but soft   -continue senna-s at night, dulcolax supp in am  -maintain miralax to bid  -working toward an AM bowel program 11.  Leukocytosis  WBCs 17.7 on 2/6---> 15.7 2/10 --->12.4 2/13  -likely related to UTI and steroids    Afebrile  -decadron completed  -recheck CBC next week  Cont to monitor 12.?  Anemia of chronic disease  Hemoglobin 12.7 2/13 13. Insomnia: scheduled trazodone      LOS: 8 days A FACE TO FACE EVALUATION WAS PERFORMED  [name] 02/06/2019, 9:54 AM

## 2019-02-06 NOTE — Progress Notes (Signed)
Occupational Therapy Weekly Progress Note  Patient Details  Name: Ellen Hill MRN: [id number] Date of Birth: [birth year]  Beginning of progress report period: 01/30/19 End of progress report period: 02/06/19  Today's Date: 02/06/2019 OT Individual Time: 8502-7741 OT Individual Time Calculation (min): 72 min   Patient has met 3 of 3 short term goals.   Patient is making steady progress towards LTGs at this time. Her upright sitting tolerance has improved from time of evaluation, as has her ability to assist more with self care tasks bedlevel and EOB. Pt still needs to work on sitting balance in prep for functional transfer training. She continues to be +2 assist for [facility] transfers using [facility]. Her biggest strength is that she remains positive and highly motivated to improve her functional status. Continue POC.    Patient continues to demonstrate the following deficits: muscle weakness and muscle paralysis, decreased cardiorespiratoy endurance, abnormal tone and unbalanced muscle activation and decreased sitting balance, decreased standing balance, decreased postural control and decreased balance strategies and therefore will continue to benefit from skilled OT intervention to enhance overall performance with BADL.  Patient progressing toward long term goals..  Continue plan of care.  OT Short Term Goals Week 1:  OT Short Term Goal 1 (Week 1): Pt will tolerate sitting EOB/mat for 3 minutes for self care tasks.  OT Short Term Goal 1 - Progress (Week 1): Met OT Short Term Goal 2 (Week 1): Pt will perform UB self care with min Ellen overall in order to reduce assistance with functional tasks.  OT Short Term Goal 2 - Progress (Week 1): Met OT Short Term Goal 3 (Week 1): Pt will demonstrate B UE strengthening HEP with supervision and use of paper handout as needed. OT Short Term Goal 3 - Progress (Week 1): Met Week 2:  OT Short Term Goal 1 (Week 2): Pt will complete 2 self care tasks in  unsupported sitting with Mod Ellen for balance  OT Short Term Goal 2 (Week 2): Pt will incorporate pressure relief techniques into daily routine AEB pt report  OT Short Term Goal 3 (Week 2): Pt will be able to direct care for OOB toileting using BSC AEB pt and staff report  Skilled Therapeutic Interventions/Progress Updates:    Pt greeted in bed. Premedicated for pain and requesting to get dressed. OT ACE wrapped B LEs with RT assisting to support each limb. Afterwards, educated pt on reacher use to thread LEs into pants. She was able to position bed herself using hospital controls with supervision. Pt required Max Ellen for L LE, but Min Ellen for R LE with additional use of leg lifter and increased time. She assisted with elevating pants in the front and Ellen little past hips. She rolled Rt>Lt with Mod Ellen while 2nd helper assisted with elevating pants completely over hips. Supine<sit completed with Mod Ellen x2. Reciprocal scooting towards EOB Mod Ax2 as well. For twenty minutes, she sat EOB with Mod-Max Ellen for balance due to posterior bias and Rt LOBs. Max encouragement for removing B UE support from bed to engage in lotion/deoderent application, and doffing/donning shirts. She was able to complete stated self care tasks with supervision-Min Ellen. OT donned her sneakers and pt assisted with pushing her feet into shoes. Positioned pt in Maxi sling while still EOB and she completed transfer to w/c with 2 assist. Provided pt with UE HEP handout and reviewed exercise techniques using dowel rod in sitting and supine positions. She was able  demonstrate carryover using 4# dowel rod with min vcs. At end of session pt was left comfortably in w/c with all needs within reach.       Therapy Documentation Precautions:  Precautions Precautions: Back, Fall Precaution Comments: obese. No brace needed Restrictions Weight Bearing Restrictions: No ADL:      Therapy/Group: Individual Therapy  Ellen Hill Ellen Hill 02/06/2019, 12:23 PM

## 2019-02-07 ENCOUNTER — Inpatient Hospital Stay (HOSPITAL_COMMUNITY): Payer: Self-pay | Admitting: Occupational Therapy

## 2019-02-07 ENCOUNTER — Inpatient Hospital Stay (HOSPITAL_COMMUNITY): Payer: Self-pay

## 2019-02-07 NOTE — Progress Notes (Signed)
At 2100, 2 PRN vicodin given per patient's request. C/O BLE pain, Left>right. PRAFO boot applied. voltaren gel applied to left knee. Toes to bilateral feet cool, pedal pulses positive. Moderate, mushy stool at HS, Patient refused scheduled miralax, senna s, and AM supp. Assisted with female urinal. Bladder scan=64cc's. Light vaginal discharge R/T menses. +/- sleep. Tachy. Ellen Hill A

## 2019-02-07 NOTE — Progress Notes (Signed)
Occupational Therapy Session Note  Patient Details  Name: Ellen Hill MRN: [id number] Date of Birth: [birth year]  Today's Date: 02/07/2019 OT Group Time: 1100-1130 OT Group Time Calculation (min): 30 min 30 minutes missed.   Skilled Therapeutic Interventions/Progress Updates:    Pt engaged in therapeutic w/c level dance group focusing on patient choice, UE/LE strengthening, salience, activity tolerance, and social participation. Pt was guided through various dance-based exercises involving UEs/LEs and trunk. All music was selected by group members. Emphasis placed on general strengthening and endurance. Pt required encouragement to incorporate gentle LE movement but did involve UEs. She picked out one song for the group. She then returned to room with NT due to fatigue when being up in w/c. 30 minutes missed.   Therapy Documentation Precautions:  Precautions Precautions: Back, Fall Precaution Comments: obese. No brace needed Restrictions Weight Bearing Restrictions: No Pain: No s/s pain during tx Pain Assessment Pain Scale: 0-10 Pain Score: 0-No pain ADL:     Therapy/Group: Group Therapy  Mical Brun A Chrystina Naff 02/07/2019, 12:50 PM

## 2019-02-07 NOTE — Progress Notes (Signed)
[place] PHYSICAL MEDICINE & REHABILITATION PROGRESS NOTE  Subjective/Complaints: Patient sleeping comfortably when I arrived.  Refuse this morning's bowel program as well as other scheduled meds apparently.  Did have a bowel movement last night  ROS: Patient denies fever, rash, sore throat, blurred vision, nausea, vomiting, diarrhea, cough, shortness of breath or chest pain,   headache, or mood change.   Objective: Vital Signs: Blood pressure 137/89, pulse (!) 111, temperature 98.3 F (36.8 C), resp. rate 16, height 5\' 6"  (1.676 m), SpO2 100 %. No results found. No results for input(s): WBC, HGB, HCT, PLT in the last 72 hours. No results for input(s): NA, K, CL, CO2, GLUCOSE, BUN, CREATININE, CALCIUM in the last 72 hours.  Physical Exam: BP 137/89 (BP Location: Right Wrist)   Pulse (!) 111   Temp 98.3 F (36.8 C)   Resp 16   Ht 5\' 6"  (1.676 m)   SpO2 100%   BMI 64.76 kg/m  Constitutional: No distress . Vital signs reviewed. HEENT: EOMI, oral membranes moist Neck: supple Cardiovascular: RRR without murmur. No JVD    Respiratory: CTA Bilaterally without wheezes or rales. Normal effort    GI: BS +, non-tender, non-distended  Musc: mild tenderness left knee with PROM Neurological: She isalert and oriented.   Motor: Bilateral UE 5/5.  RLE HF, KE 2/5, 3/5 ADF.  LLE: Trace toe flexion and extension left foot--stable exam Senses pain and gross touch in both legs Skin: Skin iswarm. Back incision clean and intact,dry, honeycomb dressing intact Psychiatric: pleasant   Assessment/Plan: 1. 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  Physiatrist is providing close team supervision and 24 hour management of active medical problems listed below.  Physiatrist and rehab team continue to assess barriers to discharge/monitor patient progress  toward functional and medical goals  Care Tool:  Bathing    Body parts bathed by patient: Right arm, Left arm, Chest, Abdomen, Face   Body parts bathed by helper: Front perineal area, Buttocks, Right upper leg, Left upper leg, Right lower leg, Left lower leg     Bathing assist Assist Level: 2 Helpers     Upper Body Dressing/Undressing Upper body dressing   What is the patient wearing?: Pull over shirt    Upper body assist Assist Level: Minimal Assistance - Patient > 75%    Lower Body Dressing/Undressing Lower body dressing      What is the patient wearing?: Pants     Lower body assist Assist for lower body dressing: Maximal Assistance - Patient 25 - 49%     Toileting Toileting    Toileting assist Assist for toileting: 2 Helpers     Transfers Chair/bed transfer  Transfers assist     Chair/bed transfer assist level: Dependent - mechanical lift     Locomotion Ambulation   Ambulation assist   Ambulation activity did not occur: Safety/medical concerns          Walk 10 feet activity   Assist  Walk 10 feet activity did not occur: Safety/medical concerns        Walk 50 feet activity   Assist Walk 50 feet with 2 turns activity did not occur: Safety/medical concerns         Walk 150 feet activity   Assist Walk 150 feet activity did not occur: Safety/medical concerns         Walk 10 feet on uneven surface  activity   Assist Walk 10 feet on uneven surfaces activity did not occur: Safety/medical concerns         Wheelchair     Assist Will patient use wheelchair at discharge?: Yes Type of Wheelchair: (TBD) Wheelchair activity did not occur: Safety/medical concerns  Wheelchair assist level: Supervision/Verbal cueing Max wheelchair distance: 7'    Wheelchair 50 feet with 2 turns activity    Assist    Wheelchair 50 feet with 2 turns activity did not occur: Safety/medical concerns   Assist Level: Supervision/Verbal  cueing   Wheelchair 150 feet activity     Assist Wheelchair 150 feet activity did not occur: Safety/medical concerns          Medical Problem List and Plan: 1.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 No back brace required  Continue CIR therapies/ PT, OT  -PRAFO LLE at HS 2. DVT Prophylaxis/Anticoagulation: SCDs.   Vascular study limited, but negative 3. Pain Management:Neurontin 300 mg 3 times a day,   hydrocodone as needed  -change flexeril to baclofen  Left knee pain likely patellofemoral: Voltaren gel helpful 4. Mood:Provide emotional support 5. Neuropsych: This patientiscapable of making decisions on herown behalf. 6. Skin/Wound Care:Routine skin checks 7. Fluids/Electrolytes/Nutrition:Routine ins and outs  Push PO 8.Proteus/Escherichia coli UTI.   Bactrim completed 2/12 9.  Super obesity. Latest BMI 64.79. Dietary follow-up 10. Neurogenic bowel and bladder.    incontinent  Flomax started on 2/8, peri-care ongoing  Bowel meds increased on 2/9  -stools more frequent now but soft   -continue senna-s at night, dulcolax supp in am  -maintain miralax to bid  -working toward an AM bowel program--will review again with patient 11.  Leukocytosis  WBCs 17.7 on 2/6---> 15.7 2/10 --->12.4 2/13  -likely related to UTI and steroids    Afebrile  -decadron completed  -recheck CBC this week  Cont to monitor 12.?  Anemia of chronic disease  Hemoglobin 12.7 2/13 13. Insomnia: scheduled trazodone      LOS: 9 days A FACE TO FACE EVALUATION WAS PERFORMED  Ellen Hill 02/07/2019, 8:53 AM

## 2019-02-07 NOTE — Progress Notes (Signed)
Physical Therapy Session Note  Patient Details  Name: Ellen Hill MRN: [id number] Date of Birth: [birth year]  Today's Date: 02/07/2019 PT Individual Time: 0945-1100 PT Individual Time Calculation (min): 75 min   Short Term Goals: Week 1:  PT Short Term Goal 1 (Week 1): Pt will perform rolling in bed with assist x 1 consistently PT Short Term Goal 2 (Week 1): Pt will tolerate sitting EOB x 10 min PT Short Term Goal 3 (Week 1): Pt will initiate w/c mobility  Skilled Therapeutic Interventions/Progress Updates:    Pt seated in w/c upon PT arrival, agreeable to therapy tx and denies pain. Pt brushed teeth at the sink while seated in w/c, set up assist. Session focused on LE weightbearing through use of the tilt table this session. Pt transferred from w/c<>tilt table using the maxisky. Pt tolerated x 6 minutes at 30 degrees with BP 133/91 and HR 110 bpm, x 5 minutes at 40 degrees with vitals WFL and x 3 minutes at 45 degrees with vitals WFL. Therapist has pt perform quad sets with R LE while on tilt table. Pt transferred back to w/c with maxisky and transported to the gym. Pt worked on upright sitting in w/c with verbal/tactile cues to come off of the back support, pt reports feeling abdominal tightness when doing this. Pt performed shoulder press 2 x 10 with 3# dowel this session. Pt transported off unit this session and went to [facility], therapist providing education during. Pt left in dayroom at end of session with needs in reach.  Therapy Documentation Precautions:  Precautions Precautions: Back, Fall Precaution Comments: obese. No brace needed Restrictions Weight Bearing Restrictions: No    Therapy/Group: Individual Therapy  Netta Corrigan, PT, DPT 02/07/2019, 7:48 AM

## 2019-02-08 ENCOUNTER — Inpatient Hospital Stay (HOSPITAL_COMMUNITY): Payer: Self-pay | Admitting: Physical Therapy

## 2019-02-08 ENCOUNTER — Inpatient Hospital Stay (HOSPITAL_COMMUNITY): Payer: Self-pay | Admitting: Occupational Therapy

## 2019-02-08 MED ORDER — FLEET ENEMA 7-19 GM/118ML RE ENEM
1.0000 | ENEMA | Freq: Every day | RECTAL | Status: DC
  Administered 2019-02-08: 1 via RECTAL
  Filled 2019-02-08: qty 1

## 2019-02-08 MED ORDER — POLYETHYLENE GLYCOL 3350 17 G PO PACK
17.0000 g | PACK | Freq: Every day | ORAL | Status: DC
  Administered 2019-02-09 – 2019-02-10 (×2): 17 g via ORAL
  Filled 2019-02-08 (×2): qty 1

## 2019-02-08 MED ORDER — GABAPENTIN 400 MG PO CAPS
400.0000 mg | ORAL_CAPSULE | Freq: Three times a day (TID) | ORAL | Status: DC
  Administered 2019-02-08 – 2019-03-05 (×77): 400 mg via ORAL
  Filled 2019-02-08 (×52): qty 1
  Filled 2019-02-08: qty 4
  Filled 2019-02-08 (×24): qty 1

## 2019-02-08 MED ORDER — FLEET ENEMA 7-19 GM/118ML RE ENEM
1.0000 | ENEMA | Freq: Every day | RECTAL | Status: DC
  Administered 2019-02-09 – 2019-02-17 (×3): 1 via RECTAL
  Filled 2019-02-08 (×5): qty 1

## 2019-02-08 NOTE — Progress Notes (Signed)
Occupational Therapy Session Note  Patient Details  Name: Ellen Hill MRN: [id number] Date of Birth: [birth year]  Today's Date: 02/08/2019 OT Individual Time: 1052-1206 OT Individual Time Calculation (min): 74 min   Short Term Goals: Week 2:  OT Short Term Goal 1 (Week 2): Pt will complete 2 self care tasks in unsupported sitting with Mod A for balance  OT Short Term Goal 2 (Week 2): Pt will incorporate pressure relief techniques into daily routine AEB pt report  OT Short Term Goal 3 (Week 2): Pt will be able to direct care for OOB toileting using BSC AEB pt and staff report  Skilled Therapeutic Interventions/Progress Updates:    Pt greeted in bed and pain manageable for tx. OT-RN collaboration completed during session for bowel program. Pt able to assist with lowering pants in the front with instruction. Mod-Max A for rolling while 2nd helper assisted with removing LB garments in prep for transfer. Pt positioned in Maxi sling with peri-areas exposed and towels placed under lower straps. 2 helpers for hovering pt over [facility] while RN inserted enema. Due to body habitus, RN unable to insert enema, and pt was returned to bed to correct sling position so that RN could insert enema. During 2nd transfer attempt, RN successful and pt lowered on BSC to proceed with voiding. Discussed using gentle belly massage, deep breathing, and rocking techniques to facilitate BM. However after several minutes, pt reported no urge and had no output in [place]. Retrieved RN, who performed digital stimulation while OT hovered pt above BSC in sling. Pt with bowel output after. Perihygiene completed with 2 assist while pt was still hovering over BSC in Maxi sling. Pt returned to bed for application of clean brief with Mod-Max A for rolling and 2 helper present for LB dressing. 2 helpers for Maxi transfer back to w/c for lunch. 2 assist for reciprocal hip scooting back in w/c. Pt left with all needs within reach and lunch  tray set up.   Therapy Documentation Precautions:  Precautions Precautions: Back, Fall Precaution Comments: obese. No brace needed Restrictions Weight Bearing Restrictions: No Pain: Pain Assessment Pain Score: 4  ADL:       Therapy/Group: Individual Therapy  Ellen Hill 02/08/2019, 12:41 PM

## 2019-02-08 NOTE — Significant Event (Signed)
Pt refused bisacodyl suppository this AM x2. States "its going to make me go all day and I just dont want it. Pt educated.

## 2019-02-08 NOTE — Progress Notes (Signed)
Occupational Therapy Session Note  Patient Details  Name: Ellen Hill MRN: [id number] Date of Birth: June 14, 1971  Today's Date: 02/08/2019 OT Individual Time: 3559-7416 OT Individual Time Calculation (min): 60 min    Short Term Goals: Week 2:  OT Short Term Goal 1 (Week 2): Pt will complete 2 self care tasks in unsupported sitting with Mod A for balance  OT Short Term Goal 2 (Week 2): Pt will incorporate pressure relief techniques into daily routine AEB pt report  OT Short Term Goal 3 (Week 2): Pt will be able to direct care for OOB toileting using BSC AEB pt and staff report  Skilled Therapeutic Interventions/Progress Updates:    Upon entering the room, pt supine in bed with no c/o pain this session and agreeable to OT intervention. Pt verbalizing need for urinate and OT placed female catheter and pt was able to void. Pt was unable to hold catheter herself or perform hygiene and clothing management. Pt rolling L <> R with +2 assist to place sling and use of maxi sky to transfer pt into wheelchair. Pt propelled self 100' with arm rests removed from wheelchair. OT assisting pt the rest of the way via wheelchair to [employer] per pt request. Pt ordered food and able to get drink from machine while seated in wheelchair but needing assistance to return to unit as she was unable to hold food and propel wheelchair secondary to body habitus in wheelchair. Pt remained in chair at end of session with call bell and all needed items within reach upon exiting the room.   Therapy Documentation Precautions:  Precautions Precautions: Back, Fall Precaution Comments: obese. No brace needed Restrictions Weight Bearing Restrictions: No General:   Vital Signs: Therapy Vitals Temp: 97.7 F (36.5 C) Temp Source: Oral Pulse Rate: (!) 103 Resp: 20 BP: (!) 136/91 Patient Position (if appropriate): Lying Oxygen Therapy SpO2: 100 % O2 Device: Room Air   Therapy/Group: Individual  Therapy  [name] 02/08/2019, 4:15 PM

## 2019-02-08 NOTE — Progress Notes (Signed)
Patient now on daily enema bowel regimen scheduled for 0600 after refusing daily suppository bowel program. To administer enema it consisted of this RN, Herschel [place], OT, and Kelvin, RT. Patient was lifted via maxi-sky to [facility] and enema was administered before being lowered on BSC. Patient prefers that it not to be administered not to be given in the bed. Spoke with Dr. Naaman Plummer to inform him of difficult transfer and it would be easier for nursing to combine treatment with OT. Dr. Naaman Plummer was in agreement with plan and scheduled daily enema's for 0900.

## 2019-02-08 NOTE — Progress Notes (Signed)
[place] PHYSICAL MEDICINE & REHABILITATION PROGRESS NOTE  Subjective/Complaints: Pt up in bed. States she generally hurts, especially LLE. Refused am supp again. Feels 'band' like sensation over trunk when she's up with therapies  ROS: Patient denies fever, rash, sore throat, blurred vision, nausea, vomiting, diarrhea, cough, shortness of breath or chest pain,  headache, or mood change.    Objective: Vital Signs: Blood pressure (!) 133/96, pulse (!) 110, temperature 98.4 F (36.9 C), temperature source Oral, resp. rate 19, height 5\' 6"  (1.676 m), SpO2 98 %. No results found. No results for input(s): WBC, HGB, HCT, PLT in the last 72 hours. No results for input(s): NA, K, CL, CO2, GLUCOSE, BUN, CREATININE, CALCIUM in the last 72 hours.  Physical Exam: BP (!) 133/96 (BP Location: Right Arm)   Pulse (!) 110   Temp 98.4 F (36.9 C) (Oral)   Resp 19   Ht 5\' 6"  (1.676 m)   SpO2 98%   BMI 64.76 kg/m  Constitutional: No distress . Vital signs reviewed. HEENT: EOMI, oral membranes moist Neck: supple Cardiovascular: RRR without murmur. No JVD    Respiratory: CTA Bilaterally without wheezes or rales. Normal effort    GI: BS +, non-tender, non-distended  Musc: mild tenderness left knee Neurological: She isalert and oriented.   Motor: Bilateral UE 5/5.  RLE HF, KE 2/5, 3/5 ADF.  LLE: Trace toe flexion and extension left foot--stable exam Senses pain and gross touch in both legs, left leg tender to ROM Skin: Skin iswarm. Back incision cdi Psychiatric:  pleasant   Assessment/Plan: 1. 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  Physiatrist is providing close team supervision and 24 hour management of active medical problems listed below.  Physiatrist and rehab team continue to assess barriers to discharge/monitor patient progress toward  functional and medical goals  Care Tool:  Bathing    Body parts bathed by patient: Right arm, Left arm, Chest, Abdomen, Face   Body parts bathed by helper: Front perineal area, Buttocks, Right upper leg, Left upper leg, Right lower leg, Left lower leg     Bathing assist Assist Level: 2 Helpers     Upper Body Dressing/Undressing Upper body dressing   What is the patient wearing?: Pull over shirt    Upper body assist Assist Level: Minimal Assistance - Patient > 75%    Lower Body Dressing/Undressing Lower body dressing      What is the patient wearing?: Pants     Lower body assist Assist for lower body dressing: Maximal Assistance - Patient 25 - 49%     Toileting Toileting    Toileting assist Assist for toileting: 2 Helpers     Transfers Chair/bed transfer  Transfers assist     Chair/bed transfer assist level: Dependent - mechanical lift     Locomotion Ambulation   Ambulation assist   Ambulation activity did not occur: Safety/medical concerns          Walk 10 feet activity   Assist  Walk 10 feet activity did not occur: Safety/medical concerns        Walk 50 feet activity   Assist Walk 50 feet with 2 turns activity did not occur: Safety/medical concerns         Walk 150 feet activity   Assist Walk 150 feet activity did not occur: Safety/medical concerns         Walk 10 feet on  uneven surface  activity   Assist Walk 10 feet on uneven surfaces activity did not occur: Safety/medical concerns         Wheelchair     Assist Will patient use wheelchair at discharge?: Yes Type of Wheelchair: (TBD) Wheelchair activity did not occur: Safety/medical concerns  Wheelchair assist level: Supervision/Verbal cueing Max wheelchair distance: 42'    Wheelchair 50 feet with 2 turns activity    Assist    Wheelchair 50 feet with 2 turns activity did not occur: Safety/medical concerns   Assist Level: Supervision/Verbal cueing    Wheelchair 150 feet activity     Assist Wheelchair 150 feet activity did not occur: Safety/medical concerns          Medical Problem List and Plan: 1.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 No back brace required  Continue CIR therapies/ PT, OT  -PRAFO LLE at HS 2. DVT Prophylaxis/Anticoagulation: SCDs.   Vascular study limited, but negative 3. Pain Management:Neurontin 300 mg 3 times a day---increase to 400mg  tid  - hydrocodone as needed  -prn baclofen  Left knee pain likely patellofemoral: Voltaren gel helpful 4. Mood:Provide emotional support 5. Neuropsych: This patientiscapable of making decisions on herown behalf. 6. Skin/Wound Care:Routine skin checks 7. Fluids/Electrolytes/Nutrition:Routine ins and outs  Push PO 8.Proteus/Escherichia coli UTI.   Bactrim completed 2/12 9.  Super obesity. Latest BMI 64.79. Dietary follow-up 10. Neurogenic bowel and bladder.    incontinent  Flomax started on 2/8, peri-care ongoing   -continue senna-s at night, will try fleet enema qam  -decrease miralax to daily  -working toward an AM bowel program- I reviewed with patient again in front of RN what we're trying to with bowels. Need her buy in. She agreed.  11.  Leukocytosis  WBCs 17.7 on 2/6---> 15.7 2/10 --->12.4 2/13  -likely related to UTI and steroids    Afebrile  -decadron completed  -recheck CBC wednesay  Cont to monitor 12.?  Anemia of chronic disease  Hemoglobin 12.7 2/13 13. Insomnia: scheduled trazodone      LOS: 10 days A FACE TO FACE EVALUATION WAS PERFORMED  [name] 02/08/2019, 8:48 AM

## 2019-02-08 NOTE — Progress Notes (Signed)
Physical Therapy Weekly Progress Note  Patient Details  Name: Ellen Hill MRN: [id number] Date of Birth: [birth year]  Beginning of progress report period: January 30, 2019 End of progress report period: February 08, 2019  Today's Date: 02/08/2019 PT Individual Time: 0900-1015 PT Individual Time Calculation (min): 75 min   Patient has met 3 of 3 short term goals.  Pt exhibits improved ability to roll in bed with mod A and use of bedrails. Pt is able to maintain sitting balance EOB x 10-15 min with min to max A, pt tends to lean posteriorly and is fearful of balancing without UE support. Pt is able to perform w/c mobility up to 100 ft with use of BUE in manual w/c and Supervision assist before onset of fatigue. Pt continues to be dependent for transfers via overhead mechanical lift. Weekly focus on improving sitting balance in order to progress to more functional transfer methods.  Patient continues to demonstrate the following deficits muscle weakness and muscle paralysis, impaired timing and sequencing, abnormal tone, unbalanced muscle activation and decreased coordination and decreased sitting balance, decreased postural control and decreased balance strategies and therefore will continue to benefit from skilled PT intervention to increase functional independence with mobility.  Patient progressing toward long term goals..  Continue plan of care.  PT Short Term Goals Week 1:  PT Short Term Goal 1 (Week 1): Pt will perform rolling in bed with assist x 1 consistently PT Short Term Goal 1 - Progress (Week 1): Met PT Short Term Goal 2 (Week 1): Pt will tolerate sitting EOB x 10 min PT Short Term Goal 2 - Progress (Week 1): Met PT Short Term Goal 3 (Week 1): Pt will initiate w/c mobility PT Short Term Goal 3 - Progress (Week 1): Met Week 2:  PT Short Term Goal 1 (Week 2): Pt will maintain sitting balance EOB/EOM with CGA consistently PT Short Term Goal 2 (Week 2): Pt will propel manual w/c  x 150 ft with Supervision PT Short Term Goal 3 (Week 2): Pt will direct staff in how to assist with care in 75% of opportunities  Skilled Therapeutic Interventions/Progress Updates:  Pt received seated in bed, agreeable to PT session. No complaints of pain. Rolling L/R with mod A and use of bedrails for dependent donning of brief and pants. Min A to don shirt while seated in bed. Dependent to don BLE ACE wrap and shoes. Rolling L/R with mod A for maxisky sling placement. Maxisky transfer bed to w/c. Manual w/c propulsion x 100 ft with use of BUE with w/c armrests removed for improved body mechanics. Maxisky transfer w/c to mat table. Sitting balance EOM with mod A posteriorly, 4" step under BLE for support. Attempt to progress to decreased BUE support in "tripod" position, discover pt has had incontinence of urine. Maxisky transfer back to w/c then back to bed dependently. Rolling L/R with mod A for dependent pericare and brief change. Pt left seated in bed with needs in reach at end of session.  Therapy Documentation Precautions:  Precautions Precautions: Back, Fall Precaution Comments: obese. No brace needed Restrictions Weight Bearing Restrictions: No   Therapy/Group: Individual Therapy   Excell Seltzer, PT, DPT  02/08/2019, 12:18 PM

## 2019-02-09 ENCOUNTER — Inpatient Hospital Stay (HOSPITAL_COMMUNITY): Payer: Self-pay | Admitting: Physical Therapy

## 2019-02-09 ENCOUNTER — Inpatient Hospital Stay (HOSPITAL_COMMUNITY): Payer: Self-pay

## 2019-02-09 NOTE — Patient Care Conference (Signed)
Inpatient RehabilitationTeam Conference and Plan of Care Update Date: 02/09/2019   Time: 2:20 PM    Patient Name: Ellen Hill [facility]      Medical Record Number: [id number]  Date of Birth: October 10, 1971 Sex: Female         Room/Bed: 4W14C/4W14C-01 Payor Info: Payor: [place] / Plan: [facility] PPO / Product Type: *No Product type* /    Admitting Diagnosis: Thoracic myelopathy incomplete sci  Admit Date/Time:  01/29/2019  1:39 PM Admission Comments: No comment available   Primary Diagnosis:  <principal problem not specified> Principal Problem: <principal problem not specified>  Patient Active Problem List   Diagnosis Date Noted  . Anemia of chronic disease   . Leukocytosis   . Neurogenic bowel   . Neurogenic bladder   . E. coli UTI   . Myelopathy ([place]) 01/29/2019  . 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 Lumbar back pain with radiculopathy affecting lower extremity 12/08/2018  . Dysmenorrhea 04/13/2012  . Class 3 severe obesity due to excess calories without serious comorbidity with body mass index (BMI) of 60.0 to 69.9 in adult ([place]) 04/13/2012  . Anemia 04/13/2012  . Fibroids 04/13/2012  . Menorrhagia 03/07/2012    Expected Discharge Date: Expected Discharge Date: 03/02/19  Team Members Present: Physician leading conference: Dr. Alger Simons Social Worker Present: Alfonse Alpers, LCSW Nurse Present: Rosita Fire, LPN PT Present: Other (comment)(Taylor Ervin Knack, PT) OT Present: Other (comment)(Sandra Rosana Hoes, OT) SLP Present: Weston Anna, SLP PPS Coordinator present : Gunnar Fusi     Current Status/Progress Goal Weekly Team Focus  Medical   still with unregulated bowels, slow neuro return, pain issues persist  improve pain, bowel regimen consistency  see above, bladder emptying   Bowel/Bladder   Continent of bowel and bladder, occasional incontinent episodes. LBM-  Regain normal pattern of bowel and bladder  Assist with toilteing needs every shift and as  needed.    Swallow/Nutrition/ Hydration             ADL's   mod A rolling, +2 LB self care bed level, UB self care mod A, maxisky transfers +2  mod A LB dressing and toileting with min A for overall goals  sitting balance, self care, strengthening, pt/family edu   Mobility   mod A rolling, dependent for transfers via maxisky, S w/c mobility x 100'  min A overall  sitting balance to progress to functional transfers, w/c mobility   Communication             Safety/Cognition/ Behavioral Observations            Pain   C/o pain to BLE-managed with norco, scheduled gabapentin, and prn baclofen  pain less than 3  Assess pain every shift and as needed.    Skin   healing fissure to buttock fold   no further skin issues  assess skin every shift and as needed.     Rehab Goals Patient on target to meet rehab goals: Yes Rehab Goals Revised: none *See Care Plan and progress notes for long and short-term goals.     Barriers to Discharge  Current Status/Progress Possible Resolutions Date Resolved   Physician    Medical stability               Nursing                  PT  Inaccessible home environment;Decreased caregiver support;Medical stability;Home environment access/layout;Incontinence;Weight  OT                  SLP                SW                Discharge Planning/Teaching Needs:  Pt's sister was able to obtain a first floor, one level unit and she and pt are still preparing for pt to discharge to this new home.  Family education to occur prior to pt's d/c.   Team Discussion:  Pt struggles with bowels, bladder, and pain.  Back dressing changed today and looks good.  Foam patch on her bottom - looks fine.  Nursing is working on bowel program that works for pt.  Pt is dependent for transfers with lift and does not have good core strength, but she is very motivated and works hard.  OT stated pt is mod A for rolling and total A to total A +2 for dressing with  maxisky.  OT to send home measurement sheet home with sister for layout of new home.  Revisions to Treatment Plan:  none    Continued Need for Acute Rehabilitation Level of Care: The patient requires daily medical management by a physician with specialized training in physical medicine and rehabilitation for the following conditions: Daily direction of a multidisciplinary physical rehabilitation program to ensure safe treatment while eliciting the highest outcome that is of practical value to the patient.: Yes Daily medical management of patient stability for increased activity during participation in an intensive rehabilitation regime.: Yes Daily analysis of laboratory values and/or radiology reports with any subsequent need for medication adjustment of medical intervention for : Post surgical problems;Neurological problems;Urological problems;Other   I attest that I was present, lead the team conference, and concur with the assessment and plan of the team.   Naryiah Schley, Silvestre Mesi 02/09/2019, 2:37 PM

## 2019-02-09 NOTE — Progress Notes (Signed)
Occupational Therapy Session Note  Patient Details  Name: Ellen Hill MRN: [id number] Date of Birth: May 11, 1971  Today's Date: 02/09/2019 OT Individual Time: 0830-0930 Session 2: 1045-1200 OT Individual Time Calculation (min): 60 min Session 2: 75 min   Short Term Goals: Week 2:  OT Short Term Goal 1 (Week 2): Pt will complete 2 self care tasks in unsupported sitting with Mod A for balance  OT Short Term Goal 2 (Week 2): Pt will incorporate pressure relief techniques into daily routine AEB pt report  OT Short Term Goal 3 (Week 2): Pt will be able to direct care for OOB toileting using BSC AEB pt and staff report  Skilled Therapeutic Interventions/Progress Updates:    Session 1: Session focused on toileting and bowel program with care coordination with nursing. Pt completed rolling R and L with mod-max +2 to position sling for maxi sky and for LPN Kayla to insert enema. Maxi sky was used to transfer pt to [facility]. Excellent care direction from pt throughout session. Increased time provided for pt void. Pt returned to bed and completed more rolling R and L with mod A, using bed rails. Pt left supine with all needs met, on bed pan.   Session 2: Session focused on dynamic/static sitting balance. Pt with continued c/o pain in L UE with tactile input/movement. Pt received in bed. Pants donned total A supine. Pt rolled R and L with mod A to position sling for mechanical lift. Pt was transferred into w/c and transported into gym, then transferred to mat similiarly. Pt initially provided with posterior support for sitting balance but with cueing was able to progress slowly to sitting EOM with no support. Pt completed functional reaching in all planes to challenge core stabilization, moderate cueing for technique/encouragement throughout. No overt LOB. Pt returned to w/c via maxi sky and then was left sitting up in w/c in room with all needs met.   Therapy Documentation Precautions:   Precautions Precautions: Back, Fall Precaution Comments: obese. No brace needed Restrictions Weight Bearing Restrictions: No Pain: Pain Assessment Pain Scale: 0-10 Pain Score: 8  Pain Type: Acute pain Pain Location: Leg Pain Orientation: Left Pain Descriptors / Indicators: Aching Pain Onset: On-going Pain Intervention(s): RN made aware;Emotional support   Therapy/Group: Individual Therapy  Curtis Sites 02/09/2019, 12:07 PM

## 2019-02-09 NOTE — Progress Notes (Signed)
Physical Therapy Session Note  Patient Details  Name: Ellen Hill MRN: [id number] Date of Birth: [birth year]  Today's Date: 02/09/2019 PT Individual Time: 1300-1400; 1500-1530 PT Individual Time Calculation (min): 60 min and 30 min  Short Term Goals: Week 2:  PT Short Term Goal 1 (Week 2): Pt will maintain sitting balance EOB/EOM with CGA consistently PT Short Term Goal 2 (Week 2): Pt will propel manual w/c x 150 ft with Supervision PT Short Term Goal 3 (Week 2): Pt will direct staff in how to assist with care in 75% of opportunities  Skilled Therapeutic Interventions/Progress Updates:    Session 1: Pt received seated in w/c in room, agreeable to PT session. Pt reports 8/10 pain in abdomen and LLE, RN provides pain medication at beginning of session. Attempt to perform slide board transfer w/c to mat table while in overhead maxisky sling for safety, total A x 2. Pt unable to complete slide board transfer 2/2 pain in LLE. During transfer pt requires max A to maintain anterior lean and is unable to use BUE for much assist pushing up 2/2 body habitus. Sitting balance focus on leaning forwards and reaching with alt UE forwards and across midline for targets. Discussion with patient about focus of therapy sessions, goals, expected level of assist needed at d/c, etc. Pt left seated in w/c in room with needs in reach.  Session 2: Pt received seated in w/c in room, agreeable to PT session. Pt reports ongoing pain in LLE this PM, not rated and requests pain medication from RN. Dependent maxisky transfer w/c to bed. Rolling L/R with mod A and use of bedrails to remove maxisky sling and for placement of pillows for optimal positioning. Provided home measurement sheet for patient to give to family to see if she will be able to fit through doorways in a w/c in her home. Pt left seated in bed with needs in reach.  Therapy Documentation Precautions:  Precautions Precautions: Back, Fall Precaution  Comments: obese. No brace needed Restrictions Weight Bearing Restrictions: No Pain: Pain Assessment Pain Scale: 0-10 Pain Score: 8  Pain Type: Acute pain Pain Location: Abdomen Pain Descriptors / Indicators: Dull(Intermittent with movement.) Pain Intervention(s): RN made aware;Emotional support;Other (Comment)    Therapy/Group: Individual Therapy  Excell Seltzer, PT, DPT  02/09/2019, 2:13 PM

## 2019-02-09 NOTE — Progress Notes (Signed)
[place] PHYSICAL MEDICINE & REHABILITATION PROGRESS NOTE  Subjective/Complaints: Sitting in bed with a bag of potato chips at her side. States that pain is better today, thankful. Expressed that she will have her enema later this morning (with a sigh).   ROS: Patient denies fever, rash, sore throat, blurred vision, nausea, vomiting, diarrhea, cough, shortness of breath or chest pain,   headache, or mood change.    Objective: Vital Signs: Blood pressure 134/88, pulse (!) 115, temperature 98.3 F (36.8 C), temperature source Oral, resp. rate 18, height 5\' 6"  (1.676 m), SpO2 100 %. No results found. No results for input(s): WBC, HGB, HCT, PLT in the last 72 hours. No results for input(s): NA, K, CL, CO2, GLUCOSE, BUN, CREATININE, CALCIUM in the last 72 hours.  Physical Exam: BP 134/88 (BP Location: Right Arm)   Pulse (!) 115   Temp 98.3 F (36.8 C) (Oral)   Resp 18   Ht 5\' 6"  (1.676 m)   SpO2 100%   BMI 64.76 kg/m  Constitutional: No distress . Vital signs reviewed. HEENT: EOMI, oral membranes moist Neck: supple Cardiovascular: RRR without murmur. No JVD    Respiratory: CTA Bilaterally without wheezes or rales. Normal effort    GI: BS +, non-tender, non-distended  Musc: mild tenderness left knee, tolerated palpation and movement better today  Neurological: She isalert and oriented.   Motor: Bilateral UE 5/5.  RLE HF, KE 2/5, 3/5 ADF.  LLE: Trace toe flexion and extension left foot--stable exam Senses pain and gross touch in both legs--stable Skin: Skin iswarm. Back incision cdi Psychiatric:  pleasant   Assessment/Plan: 1. 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  Physiatrist is providing close team supervision and 24 hour management of active medical problems listed below.  Physiatrist and rehab team continue to assess  barriers to discharge/monitor patient progress toward functional and medical goals  Care Tool:  Bathing    Body parts bathed by patient: Right arm, Left arm, Chest, Abdomen, Face   Body parts bathed by helper: Front perineal area, Buttocks, Right upper leg, Left upper leg, Right lower leg, Left lower leg     Bathing assist Assist Level: 2 Helpers     Upper Body Dressing/Undressing Upper body dressing   What is the patient wearing?: Pull over shirt    Upper body assist Assist Level: Minimal Assistance - Patient > 75%    Lower Body Dressing/Undressing Lower body dressing      What is the patient wearing?: Pants     Lower body assist Assist for lower body dressing: Maximal Assistance - Patient 25 - 49%     Toileting Toileting    Toileting assist Assist for toileting: 2 Helpers     Transfers Chair/bed transfer  Transfers assist     Chair/bed transfer assist level: Dependent - mechanical lift     Locomotion Ambulation   Ambulation assist   Ambulation activity did not occur: Safety/medical concerns          Walk 10 feet activity   Assist  Walk 10 feet activity did not occur: Safety/medical concerns        Walk 50 feet activity   Assist Walk 50 feet with 2 turns activity did not occur: Safety/medical concerns         Walk 150 feet activity   Assist Walk 150 feet activity did not occur: Safety/medical concerns  Walk 10 feet on uneven surface  activity   Assist Walk 10 feet on uneven surfaces activity did not occur: Safety/medical concerns         Wheelchair     Assist Will patient use wheelchair at discharge?: Yes Type of Wheelchair: (TBD) Wheelchair activity did not occur: Safety/medical concerns  Wheelchair assist level: Supervision/Verbal cueing Max wheelchair distance: 100'    Wheelchair 50 feet with 2 turns activity    Assist    Wheelchair 50 feet with 2 turns activity did not occur: Safety/medical  concerns   Assist Level: Supervision/Verbal cueing   Wheelchair 150 feet activity     Assist Wheelchair 150 feet activity did not occur: Safety/medical concerns          Medical Problem List and Plan: 1.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 No back brace required  -Interdisciplinary Team Conference today    -PRAFO LLE at HS 2. DVT Prophylaxis/Anticoagulation: SCDs.   Vascular study limited, but negative 3. Pain Management:Neurontin 300 mg 3 times a day---increased to 400mg  tid with benefit  - hydrocodone as needed  -prn baclofen  Left knee pain likely patellofemoral: Voltaren gel helpful 4. Mood:Provide emotional support 5. Neuropsych: This patientiscapable of making decisions on herown behalf. 6. Skin/Wound Care:Routine skin checks 7. Fluids/Electrolytes/Nutrition:Routine ins and outs  Push PO 8.Proteus/Escherichia coli UTI.   Bactrim completed 2/12 9.  Super obesity. Latest BMI 64.79. Dietary follow-up  -discussed appropriate intake to help with healing and weight control  -she says the only things she can eat are chips and pickles. Family does bring in some things from outside. 10. Neurogenic bowel and bladder.    incontinent  Flomax started on 2/8, peri-care ongoing   -continue senna-s at night, will try fleet enema qam  -decrease miralax to daily  -working toward an AM bowel program- I reviewed with patient AGAIN what we are trying to do and need for patience and buy in  11.  Leukocytosis  WBCs 17.7 on 2/6---> 15.7 2/10 --->12.4 2/13  -likely related to UTI and steroids    Afebrile  -decadron completed  -recheck CBC wednesay  Cont to monitor 12.?  Anemia of chronic disease  Hemoglobin 12.7 2/13 13. Insomnia: scheduled trazodone      LOS: 11 days A FACE TO FACE EVALUATION WAS PERFORMED  [name] 02/09/2019, 8:45 AM

## 2019-02-09 NOTE — Plan of Care (Signed)
  Problem: Consults Goal: RH SPINAL CORD INJURY PATIENT EDUCATION Description  See Patient Education module for education specifics.  Outcome: Progressing Goal: Skin Care Protocol Initiated - if Braden Score 18 or less Description If consults are not indicated, leave blank or document N/A Outcome: Progressing Goal: Nutrition Consult-if indicated Outcome: Progressing   Problem: SCI BOWEL ELIMINATION Goal: RH STG MANAGE BOWEL WITH ASSISTANCE Description STG Manage Bowel with moderate x2 Assistance.  Outcome: Progressing Goal: RH STG SCI MANAGE BOWEL WITH MEDICATION WITH ASSISTANCE Description STG SCI Manage bowel with medication with min to mod assistance.  Outcome: Progressing Goal: RH STG SCI MANAGE BOWEL PROGRAM W/ASSIST OR AS APPROPRIATE Description STG SCI Manage bowel program with min to mod assist or as appropriate.  Outcome: Progressing   Problem: SCI BLADDER ELIMINATION Goal: RH STG MANAGE BLADDER WITH ASSISTANCE Description STG Manage Bladder With maximum Assistance  Outcome: Progressing Goal: RH STG MANAGE BLADDER WITH EQUIPMENT WITH ASSISTANCE Description STG Manage Bladder With Bladder scanner Equipment With moderate Assistance.  *Bladder scan prior to each void. *PVR after every void.   Outcome: Progressing   Problem: RH SKIN INTEGRITY Goal: RH STG SKIN FREE OF INFECTION/BREAKDOWN Description Skin to remain free from infection and breakdown while on rehab with moderate assistance.  Outcome: Progressing Goal: RH STG MAINTAIN SKIN INTEGRITY WITH ASSISTANCE Description STG Maintain Skin Integrity With min to mod Assistance.  * Use Barrier Cream and MGP on skin folds, buttocks, and groin area PRN.  Outcome: Progressing Goal: RH STG ABLE TO PERFORM INCISION/WOUND CARE W/ASSISTANCE Description STG Able To Perform Incision/Wound Care With min to mod Assistance.  Outcome: Progressing   Problem: RH SAFETY Goal: RH STG ADHERE TO SAFETY PRECAUTIONS  W/ASSISTANCE/DEVICE Description STG Adhere to Safety Precautions With mod to max Assistance and with appropriate safety or assistive Device.  Outcome: Progressing   Problem: RH PAIN MANAGEMENT Goal: RH STG PAIN MANAGED AT OR BELOW PT'S PAIN GOAL Description <3 on a 1-10 pain scale  Outcome: Progressing   Problem: RH KNOWLEDGE DEFICIT SCI Goal: RH STG INCREASE KNOWLEDGE OF SELF CARE AFTER SCI Description Patient will be able to demonstrate how to manage medications, diet, bowel and bladder with min assistance from rehab staff before discharge.  Outcome: Progressing

## 2019-02-10 ENCOUNTER — Inpatient Hospital Stay (HOSPITAL_COMMUNITY): Payer: Self-pay

## 2019-02-10 ENCOUNTER — Inpatient Hospital Stay (HOSPITAL_COMMUNITY): Payer: Self-pay | Admitting: Physical Therapy

## 2019-02-10 LAB — CBC
HCT: 34 % — ABNORMAL LOW (ref 36.0–46.0)
Hemoglobin: 10.2 g/dL — ABNORMAL LOW (ref 12.0–15.0)
MCH: 23.4 pg — ABNORMAL LOW (ref 26.0–34.0)
MCHC: 30 g/dL (ref 30.0–36.0)
MCV: 78 fL — ABNORMAL LOW (ref 80.0–100.0)
Platelets: 89 10*3/uL — ABNORMAL LOW (ref 150–400)
RBC: 4.36 MIL/uL (ref 3.87–5.11)
RDW: 18.5 % — ABNORMAL HIGH (ref 11.5–15.5)
WBC: 6.8 10*3/uL (ref 4.0–10.5)
nRBC: 0 % (ref 0.0–0.2)

## 2019-02-10 NOTE — Plan of Care (Signed)
  Problem: Consults Goal: RH SPINAL CORD INJURY PATIENT EDUCATION Description  See Patient Education module for education specifics.  Outcome: Progressing Goal: Skin Care Protocol Initiated - if Braden Score 18 or less Description If consults are not indicated, leave blank or document N/A Outcome: Progressing   Problem: RH SKIN INTEGRITY Goal: RH STG SKIN FREE OF INFECTION/BREAKDOWN Description Skin to remain free from infection and breakdown while on rehab with moderate assistance.  Outcome: Progressing Goal: RH STG MAINTAIN SKIN INTEGRITY WITH ASSISTANCE Description STG Maintain Skin Integrity With min to mod Assistance.  * Use Barrier Cream and MGP on skin folds, buttocks, and groin area PRN.  Outcome: Progressing

## 2019-02-10 NOTE — Progress Notes (Signed)
[facility] PHYSICAL MEDICINE & REHABILITATION PROGRESS NOTE  Subjective/Complaints: Patient states that she had a reasonable night.  Pain under control for the most part.  Trying to time up medications with therapy for optimal results.  Had some success with enema and willing to continue using.  ROS: Patient denies fever, rash, sore throat, blurred vision, nausea, vomiting, diarrhea, cough, shortness of breath or chest pain,  headache, or mood change.    Objective: Vital Signs: Blood pressure 138/86, pulse 98, temperature 98.4 F (36.9 C), temperature source Oral, resp. rate 16, height 5\' 6"  (1.676 m), SpO2 99 %. No results found. No results for input(s): WBC, HGB, HCT, PLT in the last 72 hours. No results for input(s): NA, K, CL, CO2, GLUCOSE, BUN, CREATININE, CALCIUM in the last 72 hours.  Physical Exam: BP 138/86 (BP Location: Right Arm)   Pulse 98   Temp 98.4 F (36.9 C) (Oral)   Resp 16   Ht 5\' 6"  (1.676 m)   SpO2 99%   BMI 64.76 kg/m  Constitutional: No distress . Vital signs reviewed. HEENT: EOMI, oral membranes moist Neck: supple Cardiovascular: RRR without murmur. No JVD    Respiratory: CTA Bilaterally without wheezes or rales. Normal effort    GI: BS +, non-tender, non-distended  Musc: mild tenderness left knee, tolerated palpation and movement better today  Neurological: She isalert and oriented.   Motor: Bilateral UE 5/5.  RLE HF, KE 2/5, 3/5 ADF.  LLE: Trace toe flexion and extension left foot--no changes Senses pain and gross touch in both legs--no changes Skin: Skin iswarm.  Back incision not visualized Psychiatric:  pleasant   Assessment/Plan: 1. 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  Physiatrist is providing close team supervision and 24 hour management of active medical problems listed below.  Physiatrist  and rehab team continue to assess barriers to discharge/monitor patient progress toward functional and medical goals  Care Tool:  Bathing    Body parts bathed by patient: Right arm, Left arm, Chest, Abdomen, Face   Body parts bathed by helper: Front perineal area, Buttocks, Right upper leg, Left upper leg, Right lower leg, Left lower leg     Bathing assist Assist Level: 2 Helpers     Upper Body Dressing/Undressing Upper body dressing   What is the patient wearing?: Pull over shirt    Upper body assist Assist Level: Minimal Assistance - Patient > 75%    Lower Body Dressing/Undressing Lower body dressing      What is the patient wearing?: Pants     Lower body assist Assist for lower body dressing: Maximal Assistance - Patient 25 - 49%     Toileting Toileting    Toileting assist Assist for toileting: 2 Helpers     Transfers Chair/bed transfer  Transfers assist     Chair/bed transfer assist level: Dependent - mechanical lift     Locomotion Ambulation   Ambulation assist   Ambulation activity did not occur: Safety/medical concerns          Walk 10 feet activity   Assist  Walk 10 feet activity did not occur: Safety/medical concerns        Walk 50 feet activity   Assist Walk 50 feet with 2 turns activity did not occur: Safety/medical concerns         Walk 150 feet activity   Assist Walk 150 feet activity did not occur:  Safety/medical concerns         Walk 10 feet on uneven surface  activity   Assist Walk 10 feet on uneven surfaces activity did not occur: Safety/medical concerns         Wheelchair     Assist Will patient use wheelchair at discharge?: Yes Type of Wheelchair: (TBD) Wheelchair activity did not occur: Safety/medical concerns  Wheelchair assist level: Supervision/Verbal cueing Max wheelchair distance: 100'    Wheelchair 50 feet with 2 turns activity    Assist    Wheelchair 50 feet with 2 turns  activity did not occur: Safety/medical concerns   Assist Level: Supervision/Verbal cueing   Wheelchair 150 feet activity     Assist Wheelchair 150 feet activity did not occur: Safety/medical concerns          Medical Problem List and Plan: 1.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 No back brace required  --Continue CIR therapies including PT, OT     -PRAFO LLE at HS 2. DVT Prophylaxis/Anticoagulation: SCDs.   Vascular study limited, but negative 3. Pain Management:Neurontin 300 mg 3 times a day---increased to 400mg  tid with benefit  - hydrocodone as needed  -prn baclofen  Left knee pain likely patellofemoral: Voltaren gel helpful 4. Mood:Provide emotional support 5. Neuropsych: This patientiscapable of making decisions on herown behalf. 6. Skin/Wound Care:Routine skin checks 7. Fluids/Electrolytes/Nutrition:Routine ins and outs  Push PO 8.Proteus/Escherichia coli UTI.   Bactrim completed 2/12 9.  Super obesity. Latest BMI 64.79. Dietary follow-up  -discussed appropriate intake to help with healing and weight control  -she says the only things she can eat are chips and pickles. Family does bring in some things from outside. 10. Neurogenic bowel and bladder.    incontinent  Flomax started on 2/8, peri-care ongoing   -continue senna-s at night with fleet enema qam  -decreased miralax to daily  -working toward an AM bowel program-seeing some initial results. 11.  Leukocytosis  WBCs 17.7 on 2/6---> 15.7 2/10 --->12.4 2/13  -likely related to UTI and steroids    Afebrile  -decadron completed  -recheck CBC Thursday  Cont to monitor 12.?  Anemia of chronic disease  Hemoglobin 12.7 2/13--recheck Thursday 13. Insomnia: scheduled trazodone      LOS: 12 days A FACE TO FACE EVALUATION WAS PERFORMED  [name] 02/10/2019, 8:54 AM

## 2019-02-10 NOTE — Progress Notes (Signed)
Physical Therapy Session Note  Patient Details  Name: Ellen Hill MRN: [id number] Date of Birth: Sep 03, 1971  Today's Date: 02/10/2019 PT Individual Time: 1015-1115; 1400-1500 PT Individual Time Calculation (min): 60 min and 60 min  Short Term Goals: Week 2:  PT Short Term Goal 1 (Week 2): Pt will maintain sitting balance EOB/EOM with CGA consistently PT Short Term Goal 2 (Week 2): Pt will propel manual w/c x 150 ft with Supervision PT Short Term Goal 3 (Week 2): Pt will direct staff in how to assist with care in 75% of opportunities  Skilled Therapeutic Interventions/Progress Updates:    Session 1: Pt received seated in bed, agreeable to PT session. Pt has onset of LLE pain with mobility, decreases at rest. Pain is not rated and pt declines any intervention. Rolling L/R with max A for dependent donning of pants and placement of maxisky sling. Pt reports feeling more fatigued this AM following bowel program and requires increased assist for mobility this date. Maxisky transfer bed to w/c dependently. Manual w/c propulsion x 100 ft with use of BUE and Supervision. Maxisky transfer w/c to mat table. Sitting balance EOM with max A posteriorly progressing to close SBA with BUE on pt's thighs. Pt tolerates sitting with close SBA 2 x 5 min. Maxisky transfer back to w/c then back to bed per pt request. Pt left supine in bed with needs in reach.  Session 2: Pt received supine in bed, was able to take a short nap over lunch and feeling better this PM. No complaints of pain. Maxisky transfer bed to tilt table. Tilt table progression from 0 degrees to 20 degrees, 30 degrees, 40 degrees, 50 degrees, and finally 55 degrees, several minutes spent at each level. BP in supine 130/78, 20 degrees 123/88, and no significant change with increase in elevation. Pt remains asymptomatic throughout session. Standing ball toss on tilt table with use of BUE reaching across midline and outside BOS for global endurance  and core strengthening. Pt does have one instance of L knee buckling in stance at 55 degrees, decreased angle to 50 degrees and pt able to maintain standing. Pt unable to weight RLE in standing to perform any therex. Maxisky transfer back to bed. Pt left supine in bed with needs in reach at end of session.  Therapy Documentation Precautions:  Precautions Precautions: Back, Fall Precaution Comments: obese. No brace needed Restrictions Weight Bearing Restrictions: No   Therapy/Group: Individual Therapy   Excell Seltzer, PT, DPT  02/10/2019, 12:40 PM

## 2019-02-10 NOTE — Progress Notes (Addendum)
Occupational Therapy Session Note  Patient Details  Name: Ellen Hill MRN: [id number] Date of Birth: 12/02/1971  Today's Date: 02/10/2019 OT Individual Time: 7001-7494 OT Individual Time Calculation (min): 72 min    Short Term Goals: Week 1:  OT Short Term Goal 1 (Week 1): Pt will tolerate sitting EOB/mat for 3 minutes for self care tasks.  OT Short Term Goal 1 - Progress (Week 1): Met OT Short Term Goal 2 (Week 1): Pt will perform UB self care with min A overall in order to reduce assistance with functional tasks.  OT Short Term Goal 2 - Progress (Week 1): Met OT Short Term Goal 3 (Week 1): Pt will demonstrate B UE strengthening HEP with supervision and use of paper handout as needed. OT Short Term Goal 3 - Progress (Week 1): Met  Skilled Therapeutic Interventions/Progress Updates:    1;1. Pt received in bed with no report of pain. Coordination with nursing about bowel program timing. Attempted to allow 15 min or greater after enema prior to getting on BSC (LEs wrapped and oral care completed during time. When attempting to place sling pt reporting needing bed pan because "we arent going to make it over," however after waiting for pt to void on bed pan, pt unable. Pt rolls B during session with MOD A to place sling for second attempt to go over to [facility].Marland Kitchen Pt continues to still not void seated in BSC, however pt uncomfortableas sling was placed too high. Back in bed, Pt completes 1x15 shoulder press, chest press, flex/ext, elbow flex ext and int/ext rotation for BUE strengthening nad endurance required for BADLs. As OT leaving pt reports voiding (60 min post enema). OT discusses with rehab team and RN about timing for next day for bowel program. Exited session with pt seated in bed, call light in reach and all needs met  Therapy Documentation Precautions:  Precautions Precautions: Back, Fall Precaution Comments: obese. No brace needed Restrictions Weight Bearing Restrictions:  No General:   Vital Signs: Therapy Vitals Temp: 98.4 F (36.9 C) Temp Source: Oral Pulse Rate: 98 Resp: 16 BP: 138/86 Patient Position (if appropriate): Lying Oxygen Therapy SpO2: 99 % O2 Device: Room Air Pain:   ADL:   Vision   Perception    Praxis   Exercises:   Other Treatments:     Therapy/Group: Individual Therapy  [name] 02/10/2019, 8:40 AM

## 2019-02-11 ENCOUNTER — Inpatient Hospital Stay (HOSPITAL_COMMUNITY): Payer: Self-pay

## 2019-02-11 ENCOUNTER — Inpatient Hospital Stay (HOSPITAL_COMMUNITY): Payer: Self-pay | Admitting: Physical Therapy

## 2019-02-11 LAB — CBC
HCT: 33.4 % — ABNORMAL LOW (ref 36.0–46.0)
HEMOGLOBIN: 9.9 g/dL — AB (ref 12.0–15.0)
MCH: 23.1 pg — ABNORMAL LOW (ref 26.0–34.0)
MCHC: 29.6 g/dL — ABNORMAL LOW (ref 30.0–36.0)
MCV: 78 fL — ABNORMAL LOW (ref 80.0–100.0)
Platelets: 100 10*3/uL — ABNORMAL LOW (ref 150–400)
RBC: 4.28 MIL/uL (ref 3.87–5.11)
RDW: 18.5 % — ABNORMAL HIGH (ref 11.5–15.5)
WBC: 6.6 10*3/uL (ref 4.0–10.5)
nRBC: 0 % (ref 0.0–0.2)

## 2019-02-11 MED ORDER — POLYSACCHARIDE IRON COMPLEX 150 MG PO CAPS
150.0000 mg | ORAL_CAPSULE | Freq: Every day | ORAL | Status: DC
  Administered 2019-02-11 – 2019-03-05 (×23): 150 mg via ORAL
  Filled 2019-02-11 (×23): qty 1

## 2019-02-11 NOTE — Progress Notes (Signed)
[facility] PHYSICAL MEDICINE & REHABILITATION PROGRESS NOTE  Subjective/Complaints: Pt complains of gas, stomach upset. Denies frank pain, cramps. States she's felt that way since yesterday late afternoon. Had mushy BM yesterday with suppository   ROS: Patient denies fever, rash, sore throat, blurred vision,   vomiting, diarrhea, cough, shortness of breath or chest pain, joint or back pain, headache, or mood change.    Objective: Vital Signs: Blood pressure 113/75, pulse (!) 103, temperature 98 F (36.7 C), temperature source Oral, resp. rate 18, height 5\' 6"  (1.676 m), SpO2 100 %. No results found. Recent Labs    02/10/19 0706 02/11/19 0804  WBC 6.8 6.6  HGB 10.2* 9.9*  HCT 34.0* 33.4*  PLT 89* 100*   No results for input(s): NA, K, CL, CO2, GLUCOSE, BUN, CREATININE, CALCIUM in the last 72 hours.  Physical Exam: BP 113/75 (BP Location: Right Arm)   Pulse (!) 103   Temp 98 F (36.7 C) (Oral)   Resp 18   Ht 5\' 6"  (1.676 m)   SpO2 100%   BMI 64.76 kg/m  Constitutional: No distress . Vital signs reviewed. HEENT: EOMI, oral membranes moist Neck: supple Cardiovascular: RRR without murmur. No JVD    Respiratory: CTA Bilaterally without wheezes or rales. Normal effort    GI: BS +, non-tender, non-distended  Musc: mild tenderness left knee  Neurological: She isalert and oriented.   Motor: Bilateral UE 5/5.  RLE HF, KE 2/5, 3/5 ADF.  LLE: Trace toe flexion and extension left foot--no changes Senses pain and gross touch in both legs--no changes Skin: Skin iswarm.  Back incision not visualized Psychiatric:  pleasant   Assessment/Plan: 1. 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  Physiatrist is providing close team supervision and 24 hour management of active medical problems listed below.  Physiatrist and rehab team continue to  assess barriers to discharge/monitor patient progress toward functional and medical goals  Care Tool:  Bathing    Body parts bathed by patient: Right arm, Left arm, Chest, Abdomen, Face   Body parts bathed by helper: Front perineal area, Buttocks, Right upper leg, Left upper leg, Right lower leg, Left lower leg     Bathing assist Assist Level: 2 Helpers     Upper Body Dressing/Undressing Upper body dressing   What is the patient wearing?: Pull over shirt    Upper body assist Assist Level: Minimal Assistance - Patient > 75%    Lower Body Dressing/Undressing Lower body dressing      What is the patient wearing?: Pants     Lower body assist Assist for lower body dressing: Maximal Assistance - Patient 25 - 49%     Toileting Toileting    Toileting assist Assist for toileting: 2 Helpers     Transfers Chair/bed transfer  Transfers assist     Chair/bed transfer assist level: Dependent - mechanical lift     Locomotion Ambulation   Ambulation assist   Ambulation activity did not occur: Safety/medical concerns          Walk 10 feet activity   Assist  Walk 10 feet activity did not occur: Safety/medical concerns        Walk 50 feet activity   Assist Walk 50 feet with 2 turns activity did not occur: Safety/medical concerns         Walk 150 feet activity   Assist Walk 150 feet activity did  not occur: Safety/medical concerns         Walk 10 feet on uneven surface  activity   Assist Walk 10 feet on uneven surfaces activity did not occur: Safety/medical concerns         Wheelchair     Assist Will patient use wheelchair at discharge?: Yes Type of Wheelchair: Manual Wheelchair activity did not occur: Safety/medical concerns  Wheelchair assist level: Supervision/Verbal cueing Max wheelchair distance: 100'    Wheelchair 50 feet with 2 turns activity    Assist    Wheelchair 50 feet with 2 turns activity did not occur:  Safety/medical concerns   Assist Level: Supervision/Verbal cueing   Wheelchair 150 feet activity     Assist Wheelchair 150 feet activity did not occur: Safety/medical concerns          Medical Problem List and Plan: 1.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 No back brace required  --Continue CIR therapies including PT, OT     -PRAFO LLE at HS 2. DVT Prophylaxis/Anticoagulation: SCDs.   Vascular study limited, but negative 3. Pain Management:Neurontin 300 mg 3 times a day---increased to 400mg  tid with benefit  - hydrocodone as needed  -prn baclofen   Left knee pain likely patellofemoral: voltaren gel helpful 4. Mood:Provide emotional support 5. Neuropsych: This patientiscapable of making decisions on herown behalf. 6. Skin/Wound Care:Routine skin checks 7. Fluids/Electrolytes/Nutrition:Routine ins and outs  Push PO 8.Proteus/Escherichia coli UTI.   Bactrim completed 2/12 9.  Super obesity. Latest BMI 64.79. Dietary follow-up  -discussed appropriate intake to help with healing and weight control  -she says the only things she can eat are chips and pickles. Family does bring in some things from outside. 10. Neurogenic bowel and bladder. Pt with GI discomfort today of unclear etiology  Flomax started on 2/8, peri-care ongoing   -continue senna-s at night  -hold miralax for now due to upset, soft stools  -working toward an AM bowel program-seeing some initial results. 11.  Leukocytosis:wbc 6.6 2/20   -resolved  -likely related to UTI and steroids  12.?  Anemia: hgb down to 9.9 today 2/20  -check stool guaiac to r/o GI source  -re-check hgb in AM  -close observation for now 13. Insomnia: scheduled trazodone      LOS: 13 days A FACE TO FACE EVALUATION WAS PERFORMED  [name] 02/11/2019, 9:26 AM

## 2019-02-11 NOTE — Progress Notes (Signed)
Occupational Therapy Session Note  Patient Details  Name: Ellen Hill MRN: [id number] Date of Birth: Oct 17, 1971  Today's Date: 02/11/2019 OT Individual Time: [phone] OT Individual Time Calculation (min): 73 min    Short Term Goals: Week 1:  OT Short Term Goal 1 (Week 1): Pt will tolerate sitting EOB/mat for 3 minutes for self care tasks.  OT Short Term Goal 1 - Progress (Week 1): Met OT Short Term Goal 2 (Week 1): Pt will perform UB self care with min A overall in order to reduce assistance with functional tasks.  OT Short Term Goal 2 - Progress (Week 1): Met OT Short Term Goal 3 (Week 1): Pt will demonstrate B UE strengthening HEP with supervision and use of paper handout as needed. OT Short Term Goal 3 - Progress (Week 1): Met  Skilled Therapeutic Interventions/Progress Updates:    1:1. Pt reporting 5/10 abdominal pain that wash premedicated (declines intervention) and feels as though staff does not listen about too many laxatives upsetting her stomach. Continued education about bowel program and use of laxatives/stool sofeners to improve ease of voiding. OT dons ace wraps, socks and pants total A. Pt rolls B throughout session with mod-MAX A for advancing patns past hips/placement and removal of sling. Maxi sky transfer to w/c totalA. Pt grooms at sink and dons shirt with min A to pull down back seated in w/c at sink. Pt reuesting to complete BUE therex this date because "I felt it yesterday." 5# dowel rod HEP completed 1x12 as follows: shoudler flex/ext, press, int/ext rotation, elevation/depression, ab/adduction, elbow flex/ext, wrist flex/ext. Pt returned to room and maxi lift back to bed with total A. Exited session with pt seated in bed, call light inr each and all needs met  Therapy Documentation Precautions:  Precautions Precautions: Back, Fall Precaution Comments: obese. No brace needed Restrictions Weight Bearing Restrictions: No General:   Vital  Signs:    Therapy/Group: Individual Therapy  [name] 02/11/2019, 9:15 AM

## 2019-02-11 NOTE — Progress Notes (Signed)
Physical Therapy Session Note  Patient Details  Name: Tram Wrenn MRN: [id number] Date of Birth: Jul 04, 1971  Today's Date: 02/11/2019 PT Individual Time: 1530-1605 PT Individual Time Calculation (min): 35 min   Short Term Goals:  Week 2:  PT Short Term Goal 1 (Week 2): Pt will maintain sitting balance EOB/EOM with CGA consistently PT Short Term Goal 2 (Week 2): Pt will propel manual w/c x 150 ft with Supervision PT Short Term Goal 3 (Week 2): Pt will direct staff in how to assist with care in 75% of opportunities     Skilled Therapeutic Interventions/Progress Updates:   Pt resting in bed.    Neuro re-ed in supine; via demo, multimodal cues and visual feedback for R hip internal rotation, L assisted internal rotation, and bil hip internal rotation, L ankle pumps limited excursion, bil ankle pumps,  L assisted hip adduction, bil hip adduction, pelvic tilts with bil LE wt bearing (+2 assist).  Pt was very pleased about movements of her LEs.    Pt able to describe back precautions of no twisting and no lifting.  Pt left resting in bed with needs at hand.     Therapy Documentation Precautions:  Precautions Precautions: Back, Fall Precaution Comments: obese. No brace needed Restrictions Weight Bearing Restrictions: No  Pain: none per pt, except paraesthesias LLE      Therapy/Group: Individual Therapy  Caison Hearn 02/11/2019, 4:22 PM

## 2019-02-11 NOTE — Progress Notes (Signed)
Occupational Therapy Session Note  Patient Details  Name: Ellen Hill MRN: [id number] Date of Birth: 28-Jun-1971  Today's Date: 02/11/2019 OT Individual Time: 1300-1345 OT Individual Time Calculation (min): 45 min    Short Term Goals: Week 1:  OT Short Term Goal 1 (Week 1): Pt will tolerate sitting EOB/mat for 3 minutes for self care tasks.  OT Short Term Goal 1 - Progress (Week 1): Met OT Short Term Goal 2 (Week 1): Pt will perform UB self care with min A overall in order to reduce assistance with functional tasks.  OT Short Term Goal 2 - Progress (Week 1): Met OT Short Term Goal 3 (Week 1): Pt will demonstrate B UE strengthening HEP with supervision and use of paper handout as needed. OT Short Term Goal 3 - Progress (Week 1): Met  Skilled Therapeutic Interventions/Progress Updates:    1;1. Pt received in w/c still reporting nausea/gassy feeling in stomach. Pt completes w/c propulsion to/from all tx destinations ~50 feet per direction to dayroom. Pt compeltes 7 min on UBE for "warm up" increasing resistance every 2 min iwiht VC for >30 rpm while seated in w/c for improved BUE endurance required for BADLs. Pt enjoys favorite gospel music during session. Pt completes 4x2 min rounds on (2 min rest in between) of beach ball volley bouncing beach ball back to OT with 3# down rod for BUE strengthening and endruance. Pt reporting 6/10 RPE at end of each 2 min interval. Returned pt to room and utilized maxi sky to transfer back to bed. Exited session with pt seated in bed, call light in reach and all need smet  Therapy Documentation Precautions:  Precautions Precautions: Back, Fall Precaution Comments: obese. No brace needed Restrictions Weight Bearing Restrictions: No General:   Vital Signs:  Pain: Pain Assessment Pain Scale: 0-10 Pain Score: 4  Pain Type: Acute pain Pain Location: Abdomen Pain Orientation: Upper Pain Descriptors / Indicators: Aching;Discomfort Pain Frequency:  Intermittent Patients Stated Pain Goal: 4 Pain Intervention(s): Repositioned;Relaxation(pain med administered earlier) ADL:   Vision   Perception    Praxis   Exercises:   Other Treatments:     Therapy/Group: Individual Therapy  [name] 02/11/2019, 1:18 PM

## 2019-02-11 NOTE — Progress Notes (Signed)
Physical Therapy Session Note  Patient Details  Name: Ellen Hill MRN: [id number] Date of Birth: [birth year]  Today's Date: 02/11/2019 PT Individual Time: 1100-1200 PT Individual Time Calculation (min): 60 min   Short Term Goals: Week 2:  PT Short Term Goal 1 (Week 2): Pt will maintain sitting balance EOB/EOM with CGA consistently PT Short Term Goal 2 (Week 2): Pt will propel manual w/c x 150 ft with Supervision PT Short Term Goal 3 (Week 2): Pt will direct staff in how to assist with care in 75% of opportunities  Skilled Therapeutic Interventions/Progress Updates:    Pt received seated in bed, agreeable to PT. No complaints of pain at rest, has LLE pain with mobility, declines any intervention. Rolling L/R with max A and use of bedrails to place maxisky sling. Maxisky transfer bed to w/c dependently. Pt has incontinence of urine. Transport back to bed via [employer]. Rolling L/R with max A and use of bedrails for dependent pericare, brief change, pants change. Maxisky transfer back to w/c. Attempt to transfer pt to therapy mat, sling not positioned well and pt unable to complete transfer safely. Assist x 3 for safety to get pt positioned back in w/c. Pt left seated in w/c in room with needs in reach.  Therapy Documentation Precautions:  Precautions Precautions: Back, Fall Precaution Comments: obese. No brace needed Restrictions Weight Bearing Restrictions: No    Therapy/Group: Individual Therapy   Excell Seltzer, PT, DPT  02/11/2019, 12:19 PM

## 2019-02-11 NOTE — Plan of Care (Signed)
  Problem: Consults Goal: RH SPINAL CORD INJURY PATIENT EDUCATION Description  See Patient Education module for education specifics.  Outcome: Progressing Goal: Skin Care Protocol Initiated - if Braden Score 18 or less Description If consults are not indicated, leave blank or document N/A Outcome: Progressing

## 2019-02-12 ENCOUNTER — Inpatient Hospital Stay (HOSPITAL_COMMUNITY): Payer: Self-pay | Admitting: Physical Therapy

## 2019-02-12 ENCOUNTER — Inpatient Hospital Stay (HOSPITAL_COMMUNITY): Payer: Self-pay | Admitting: Occupational Therapy

## 2019-02-12 LAB — URINALYSIS, ROUTINE W REFLEX MICROSCOPIC
Bilirubin Urine: NEGATIVE
Glucose, UA: NEGATIVE mg/dL
Ketones, ur: NEGATIVE mg/dL
Nitrite: NEGATIVE
Protein, ur: 100 mg/dL — AB
RBC / HPF: >50 RBC/hpf — ABNORMAL HIGH (ref 0–5)
Specific Gravity, Urine: 1.027 (ref 1.005–1.030)
WBC, UA: >50 WBC/hpf — ABNORMAL HIGH (ref 0–5)
pH: 7 (ref 5.0–8.0)

## 2019-02-12 LAB — CBC
HCT: 35 % — ABNORMAL LOW (ref 36.0–46.0)
Hemoglobin: 10.6 g/dL — ABNORMAL LOW (ref 12.0–15.0)
MCH: 23.2 pg — ABNORMAL LOW (ref 26.0–34.0)
MCHC: 30.3 g/dL (ref 30.0–36.0)
MCV: 76.8 fL — ABNORMAL LOW (ref 80.0–100.0)
NRBC: 0 % (ref 0.0–0.2)
Platelets: 117 10*3/uL — ABNORMAL LOW (ref 150–400)
RBC: 4.56 MIL/uL (ref 3.87–5.11)
RDW: 18.2 % — ABNORMAL HIGH (ref 11.5–15.5)
WBC: 6.1 10*3/uL (ref 4.0–10.5)

## 2019-02-12 NOTE — Progress Notes (Signed)
Occupational Therapy Session Note  Patient Details  Name: Ellen Hill MRN: [id number] Date of Birth: [birth year]  Today's Date: 02/12/2019 OT Individual Time: 0349-1791 OT Individual Time Calculation (min): 73 min   Short Term Goals: Week 2:  OT Short Term Goal 1 (Week 2): Pt will complete 2 self care tasks in unsupported sitting with Mod A for balance  OT Short Term Goal 2 (Week 2): Pt will incorporate pressure relief techniques into daily routine AEB pt report  OT Short Term Goal 3 (Week 2): Pt will be able to direct care for OOB toileting using BSC AEB pt and staff report  Skilled Therapeutic Interventions/Progress Updates:    Pt greeted in bed and agreeable to tx. Declining to use BSC because she did not have the urge to go. OT ACE wrapped B LEs with Total A. Next had pt use reacher to thread pants. She managed bed controls unassisted for positioning herself during task. Pt then c/o abdominal pain, stating: "It feels like I'm in labor." She required several rest breaks and Mod A to thread R LE into pants. She rolled Rt>Lt with Max A while 2nd helper assisted with elevating pants over hips. However, pt then reported she had the urge to have a BM. 2 assist for removal of pants and for placing Maxi sling in prep for [facility] transfer. While pt was hovering over bed, pt yelling out and reporting "It's coming- put me down." She was unable to tolerate slight hover in sling for bedpan placement, and was adamant that she wanted to be back in bed. 2 helpers for rolling to the Lt for bedpan placement. Pt still with abdominal discomfort. Provided her with MHP and an aromatherapy blend (with pt consent) to promote relaxation and increase ease of void. Pt left on bedpan with all needs within reach. RN made aware of pts position and amount of time she had already been on bedpan.   Therapy Documentation Precautions:  Precautions Precautions: Back, Fall Precaution Comments: obese. No brace  needed Restrictions Weight Bearing Restrictions: No Vital Signs: Therapy Vitals Temp: 98.1 F (36.7 C) Pulse Rate: (!) 105 Resp: 18 BP: 133/82 Patient Position (if appropriate): Standing Oxygen Therapy SpO2: 100 % O2 Device: Room Air Pain: Per pt, premedicated for pain Pain Assessment Pain Scale: 0-10 Pain Score: 6  Pain Location: Abdomen Pain Descriptors / Indicators: Aching Patients Stated Pain Goal: 3 Pain Intervention(s): Medication (See eMAR) ADL:       Therapy/Group: Individual Therapy  Meshilem Machuca A Cambre Matson 02/12/2019, 3:48 PM

## 2019-02-12 NOTE — Progress Notes (Signed)
Social Work Patient ID: Ellen Hill, female   DOB: Mar 30, 1971, 48 y.o.   MRN: [id number]   CSW met with pt 02-09-19 to update her on team conference discussion and targeted d/c date of 03-02-19.  CSW reconfirmed with pt that she will need a caregiver at home and pt expressed understanding.  Also saw sister on 02-10-19 and she was aware of targeted d/c date and she is hopeful that pt will be able to d/c that day and go home.  She will get measurements of new home.  She also requested letter from Dr. Naaman Plummer for her employer so that they understand she may need to miss work to help pt after d/c.  CSW gave sister the letter.  CSW will continue to follow and assist as needed.

## 2019-02-12 NOTE — Progress Notes (Signed)
Physical Therapy Session Note  Patient Details  Name: Ellen Hill MRN: [id number] Date of Birth: 1971/11/07  Today's Date: 02/12/2019 PT Individual Time: 1100-1200 PT Individual Time Calculation (min): 60 min   Short Term Goals: Week 2:  PT Short Term Goal 1 (Week 2): Pt will maintain sitting balance EOB/EOM with CGA consistently PT Short Term Goal 2 (Week 2): Pt will propel manual w/c x 150 ft with Supervision PT Short Term Goal 3 (Week 2): Pt will direct staff in how to assist with care in 75% of opportunities  Skilled Therapeutic Interventions/Progress Updates:    Pt received seated in bed, reporting abdominal pain and cramping following administration of bowel meds this AM. Pt is using heating pad for pain relief, agreeable to participate in therapy session. Rolling L/R with max A with use of bedrails for dependent donning of brief, pants, shoes. Min A to don shirt while seated in bed. Rolling L/R with max A and used of bedrails for placement of maxisky sling. Maxisky transfer to 22x18 manual w/c to assess size and fit for potential equipment to be used upon d/c. Pt does not fit comfortably nor safely into 22x18 manual w/c, will need a custom size w/c upon d/c home. Maxisky transfer to 28" width w/c. Seated UB endurance activity: volleyball with 3# dowel, 2 x 5 min. Pt requests to lay back down at end of therapy session. Maxisky transfer back to bed. Pt left supine in bed with needs in reach.  Therapy Documentation Precautions:  Precautions Precautions: Back, Fall Precaution Comments: obese. No brace needed Restrictions Weight Bearing Restrictions: No    Therapy/Group: Individual Therapy   Excell Seltzer, PT, DPT  02/12/2019, 12:18 PM

## 2019-02-12 NOTE — Progress Notes (Signed)
Physical Therapy Session Note  Patient Details  Name: Ellen Hill MRN: [id number] Date of Birth: [birth year]  Today's Date: 02/12/2019 PT Individual Time: 4098-1191 PT Individual Time Calculation (min): 57 min   Short Term Goals: Week 2:  PT Short Term Goal 1 (Week 2): Pt will maintain sitting balance EOB/EOM with CGA consistently PT Short Term Goal 2 (Week 2): Pt will propel manual w/c x 150 ft with Supervision PT Short Term Goal 3 (Week 2): Pt will direct staff in how to assist with care in 75% of opportunities  Skilled Therapeutic Interventions/Progress Updates:   Pt in supine and agreeable to therapy, reports pain in abdomen 7/10. Max assist roll R/L to don maxi sling. Maxi-sky transfer to w/c w/ total assist x2 to scoot back in chair and reposition. Total assist w/c transport to/from therapy gym. Worked on BLE WB and global strengthening this session. Propped feet up on 4" step while seated in w/c to promote neutral LE positioning in seated and for sensory input at bottom of feet. Attempted to work on forward trunk lean in preparation for slide board transfer, however pt unable to tolerate forward lean 2/2 abdominal pain. UE/trunk strengthening exercises included 1 kg med ball shoulder press and trunk rotation to hit armrests w/ ball. 3x10 of both exercises and intermittent rest breaks 2/2 fatigue and abdominal pain w/ all active movement. Returned to room via w/c and maxisky transfer back to supine. Ended session in supine, all needs in reach. Heat pad re-applied to abdomen for pain relief.   Therapy Documentation Precautions:  Precautions Precautions: Back, Fall Precaution Comments: obese. No brace needed Restrictions Weight Bearing Restrictions: No Vital Signs: Therapy Vitals Temp: 98.1 F (36.7 C) Pulse Rate: (!) 105 Resp: 18 BP: 133/82 Patient Position (if appropriate): Standing Oxygen Therapy SpO2: 100 % O2 Device: Room Air  Therapy/Group: Individual  Therapy  Aram Domzalski Clent Demark 02/12/2019, 2:57 PM

## 2019-02-12 NOTE — Progress Notes (Signed)
[place] PHYSICAL MEDICINE & REHABILITATION PROGRESS NOTE  Subjective/Complaints: Pt feeling much better today. Stomach improved after bowel program yesterday which produced substantial results.   ROS: Patient denies fever, rash, sore throat, blurred vision, nausea, vomiting, diarrhea, cough, shortness of breath or chest pain,  headache, or mood change.     Objective: Vital Signs: Blood pressure 135/84, pulse (!) 105, temperature 98.1 F (36.7 C), resp. rate 17, height 5\' 6"  (1.676 m), SpO2 100 %. No results found. Recent Labs    02/10/19 0706 02/11/19 0804  WBC 6.8 6.6  HGB 10.2* 9.9*  HCT 34.0* 33.4*  PLT 89* 100*   No results for input(s): NA, K, CL, CO2, GLUCOSE, BUN, CREATININE, CALCIUM in the last 72 hours.  Physical Exam: BP 135/84 (BP Location: Right Wrist)   Pulse (!) 105   Temp 98.1 F (36.7 C)   Resp 17   Ht 5\' 6"  (1.676 m)   SpO2 100%   BMI 64.76 kg/m  Constitutional: No distress . Vital signs reviewed. HEENT: EOMI, oral membranes moist Neck: supple Cardiovascular: RRR without murmur. No JVD    Respiratory: CTA Bilaterally without wheezes or rales. Normal effort    GI: BS +, non-tender, non-distended  Musc: mild tenderness left knee  Neurological: She isalert and oriented. Motor: Bilateral UE 5/5.  RLE HF, KE 2/5, 3/5 ADF.  LLE: Trace toe flexion and extension left foot--no change today Senses pain and gross touch in both legs--no change today Skin: Skin iswarm.  Back incision not visualized Psychiatric: in good spirits  Assessment/Plan: 1. 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  Physiatrist is providing close team supervision and 24 hour management of active medical problems listed below.  Physiatrist and rehab team continue to assess barriers to discharge/monitor patient progress toward functional and  medical goals  Care Tool:  Bathing    Body parts bathed by patient: Right arm, Left arm, Chest, Abdomen, Face   Body parts bathed by helper: Front perineal area, Buttocks, Right upper leg, Left upper leg, Right lower leg, Left lower leg     Bathing assist Assist Level: 2 Helpers     Upper Body Dressing/Undressing Upper body dressing   What is the patient wearing?: Pull over shirt    Upper body assist Assist Level: Minimal Assistance - Patient > 75%    Lower Body Dressing/Undressing Lower body dressing      What is the patient wearing?: Pants, Ace wrap/stump shrinker(ace wrap BLE)     Lower body assist Assist for lower body dressing: Total Assistance - Patient < 25%     Toileting Toileting    Toileting assist Assist for toileting: 2 Helpers     Transfers Chair/bed transfer  Transfers assist     Chair/bed transfer assist level: Dependent - mechanical lift     Locomotion Ambulation   Ambulation assist   Ambulation activity did not occur: Safety/medical concerns          Walk 10 feet activity   Assist  Walk 10 feet activity did not occur: Safety/medical concerns        Walk 50 feet activity   Assist Walk 50 feet with 2 turns activity did not occur: Safety/medical concerns         Walk 150 feet activity   Assist Walk 150 feet activity did not occur: Safety/medical concerns         Walk  10 feet on uneven surface  activity   Assist Walk 10 feet on uneven surfaces activity did not occur: Safety/medical concerns         Wheelchair     Assist Will patient use wheelchair at discharge?: Yes Type of Wheelchair: Manual Wheelchair activity did not occur: Safety/medical concerns  Wheelchair assist level: Supervision/Verbal cueing Max wheelchair distance: 100'    Wheelchair 50 feet with 2 turns activity    Assist    Wheelchair 50 feet with 2 turns activity did not occur: Safety/medical concerns   Assist Level:  Supervision/Verbal cueing   Wheelchair 150 feet activity     Assist Wheelchair 150 feet activity did not occur: Safety/medical concerns          Medical Problem List and Plan: 1.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 No back brace required  --Continue CIR therapies including PT, OT      -PRAFO LLE at HS 2. DVT Prophylaxis/Anticoagulation: SCDs.   Vascular study limited, but negative 3. Pain Management:Neurontin 300 mg 3 times a day---increased to 400mg  tid with benefit  - hydrocodone as needed  -prn baclofen   Left knee  Patellofemoral pain: voltaren gel helpful 4. Mood:Provide emotional support 5. Neuropsych: This patientiscapable of making decisions on herown behalf. 6. Skin/Wound Care:Routine skin checks 7. Fluids/Electrolytes/Nutrition:Routine ins and outs  Push PO 8.Proteus/Escherichia coli UTI.   Bactrim completed 2/12 9.  Super obesity. Latest BMI 64.79. Dietary follow-up  -discussed appropriate intake to help with healing and weight control  -she says the only things she can eat are chips and pickles. Family does bring in some things from outside. 10. Neurogenic bowel and bladder. Pt with GI discomfort today of unclear etiology  Flomax started on 2/8, peri-care ongoing   -continue senna-s at night  -hold miralax for now due to upset, soft stools  -working toward an AM bowel program-beginning to have some results 11.  Leukocytosis:wbc 6.6 2/20   -resolved  -likely related to UTI and steroids  12.?  Anemia: hgb down to 9.9  2/20  -  stool guaiac pending  -cbc pending today  -close observation for now 13. Insomnia: scheduled trazodone      LOS: 14 days A FACE TO FACE EVALUATION WAS PERFORMED  [name] 02/12/2019, 9:01 AM

## 2019-02-13 NOTE — Plan of Care (Signed)
  Problem: Consults Goal: RH SPINAL CORD INJURY PATIENT EDUCATION Description  See Patient Education module for education specifics.  Outcome: Progressing Goal: Skin Care Protocol Initiated - if Braden Score 18 or less Description If consults are not indicated, leave blank or document N/A Outcome: Progressing   Problem: SCI BLADDER ELIMINATION Goal: RH STG MANAGE BLADDER WITH ASSISTANCE Description STG Manage Bladder With maximum Assistance  Outcome: Progressing   Problem: RH SKIN INTEGRITY Goal: RH STG SKIN FREE OF INFECTION/BREAKDOWN Description Skin to remain free from infection and breakdown while on rehab with moderate assistance.  Outcome: Progressing

## 2019-02-13 NOTE — Plan of Care (Signed)
  Problem: SCI BOWEL ELIMINATION Goal: RH STG MANAGE BOWEL WITH ASSISTANCE Description STG Manage Bowel with moderate x2 Assistance.  02/13/2019 1503 by Marney Setting, RN Outcome: Not Progressing  Goal: RH STG SCI MANAGE BOWEL WITH MEDICATION WITH ASSISTANCE Description STG SCI Manage bowel with medication with min to mod assistance.  02/13/2019 1503 by Marney Setting, RN Outcome: Not Progressing  Goal: RH STG SCI MANAGE BOWEL PROGRAM W/ASSIST OR AS APPROPRIATE Description STG SCI Manage bowel program with min to mod assist or as appropriate.  02/13/2019 1503 by Marney Setting, RN Outcome: Not Progressing    Problem: SCI BLADDER ELIMINATION Goal: RH STG MANAGE BLADDER WITH ASSISTANCE Description STG Manage Bladder With maximum Assistance  02/13/2019 1503 by Marney Setting, RN Outcome: Not Progressing    Patient refused enema with RN this am per report. Pt educated on purpose of bowel program. Pt given prn sorbitol per report with no results.   Pt not able to manage bladder without total assistance from staff. Staff positioning and holding urinal at this time and urinal not effective in preventing soiling.

## 2019-02-13 NOTE — Progress Notes (Signed)
Ellen Hill is a 48 y.o. female who is admitted for CIR with functional deficits secondary to thoracic myelopathy.  Past Medical History:  Diagnosis Date  . Abnormal Pap smear    history  . Anemia   . ASCUS (atypical squamous cells of undetermined significance) on Pap smear 2008   At 36wk of pregnancy ; colpo  . Asthma    rarely uses inhaler (seasonal)  . 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 Fibroids   . H/O candidiasis   . H/O fatigue 08/2007  . H/O varicella   . Headache(784.0)    migraines   . Heart murmur    dx child - no problems as adult  . Hx: UTI (urinary tract infection) 07/29/07  . Increased BMI   . Irregular bleeding   . Migraines   . Obesity 02/13/12  . Pelvic pain 02/13/12  . Postpartum anemia 08/18/07  . 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 after loss of parents, is fine  . Vaginal Pap smear, abnormal    f/u wnl     Subjective: No new complaints. No new problems.  History of slight worsening anemia; patient aware of need for stool sample to check for FOB.  Objective: Vital signs in last 24 hours: Temp:  [98.1 F (36.7 C)-98.9 F (37.2 C)] 98.2 F (36.8 C) (02/22 0327) Pulse Rate:  [105-115] 109 (02/22 0327) Resp:  [18-19] 18 (02/22 0327) BP: (124-133)/(80-89) 126/89 (02/22 0327) SpO2:  [100 %] 100 % (02/22 0327) Weight change:  Last BM Date: 02/10/19  Intake/Output from previous day: 02/21 0701 - 02/22 0700 In: 360 [P.O.:360] Out: 450 [Urine:450]  Patient Vitals for the past 24 hrs:  BP Temp Temp src Pulse Resp SpO2  02/13/19 0327 126/89 98.2 F (36.8 C) Oral (!) 109 18 100 %  02/13/19 0031 124/83 98.9 F (37.2 C) - (!) 114 19 100 %  02/12/19 2122 132/80 98.7 F (37.1 C) Oral (!) 115 18 100 %  02/12/19 1330 133/82 98.1 F (36.7 C) - (!) 105 18 100 %     Physical Exam General: No apparent distress obese HEENT: not dry Lungs: Normal effort. Lungs clear to auscultation, no crackles or wheezes. Cardiovascular: Regular rate and rhythm, no  edema Abdomen: S/NT/ND; BS(+) Musculoskeletal:  Unchanged; left ankle brace Neurological: No new neurological deficits with bilateral LE weakness; only trace toe extension left foot Wounds: Back incision not visualized Skin: clear no rash Mental state: Alert, oriented, cooperative    Lab Results: BMET    Component Value Date/Time   NA 134 (L) 02/01/2019 0521   K 4.7 02/01/2019 0521   CL 105 02/01/2019 0521   CO2 20 (L) 02/01/2019 0521   GLUCOSE 131 (H) 02/01/2019 0521   BUN 15 02/01/2019 0521   CREATININE 0.69 02/01/2019 0521   CALCIUM 8.6 (L) 02/01/2019 0521   GFRNONAA >60 02/01/2019 0521   GFRAA >60 02/01/2019 0521   CBC    Component Value Date/Time   WBC 6.1 02/12/2019 1012   RBC 4.56 02/12/2019 1012   HGB 10.6 (L) 02/12/2019 1012   HCT 35.0 (L) 02/12/2019 1012   PLT 117 (L) 02/12/2019 1012   MCV 76.8 (L) 02/12/2019 1012   MCH 23.2 (L) 02/12/2019 1012   MCHC 30.3 02/12/2019 1012   RDW 18.2 (H) 02/12/2019 1012   LYMPHSABS 2.0 02/01/2019 0521   MONOABS 1.0 02/01/2019 0521   EOSABS 0.1 02/01/2019 0521   BASOSABS 0.0 02/01/2019 0521    Medications: I have reviewed the  patient's current medications.  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  Continue CIR DVT prophylaxis.  Continue SCDs Morbid obesity Neurogenic bowel and bladder Anemia.  Improved to hematocrit 35% yesterday.  Stool for occult blood pending.  Continue observation   Length of stay, days: 15  Marletta Lor , MD 02/13/2019, 10:30 AM

## 2019-02-14 ENCOUNTER — Encounter (HOSPITAL_COMMUNITY): Payer: Self-pay | Admitting: Occupational Therapy

## 2019-02-14 NOTE — Progress Notes (Signed)
Occupational Therapy Session Note  Patient Details  Name: Ellen Hill MRN: [id number] Date of Birth: [birth year]  Today's Date: 02/14/2019 OT Missed Time: 51 Minutes Missed Time Reason: Patient fatigue  Skilled Therapeutic Interventions/Progress Updates:    Pt refused group tx due to fatigue after having a BM. 60 minutes missed.   Therapy Documentation Precautions:  Precautions Precautions: Back, Fall Precaution Comments: obese. No brace needed Restrictions Weight Bearing Restrictions: No General: General OT Amount of Missed Time: 60 Minutes Vital Signs: Therapy Vitals Temp: (!) 97.5 F (36.4 C) Pulse Rate: (!) 109 Resp: 18 BP: (!) 129/91 Patient Position (if appropriate): Lying Oxygen Therapy SpO2: 100 % O2 Device: Room Air   ADL:     Therapy/Group: Group Therapy  Matteo Banke A Antero Derosia 02/14/2019, 4:31 PM

## 2019-02-14 NOTE — Progress Notes (Signed)
Ellen Hill is a 48 y.o. female admitted for CIR with functional deficits secondary to thoracic myelopathy  Past Medical History:  Diagnosis Date  . Abnormal Pap smear    history  . Anemia   . ASCUS (atypical squamous cells of undetermined significance) on Pap smear 2008   At 36wk of pregnancy ; colpo  . Asthma    rarely uses inhaler (seasonal)  . 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 Fibroids   . H/O candidiasis   . H/O fatigue 08/2007  . H/O varicella   . Headache(784.0)    migraines   . Heart murmur    dx child - no problems as adult  . Hx: UTI (urinary tract infection) 07/29/07  . Increased BMI   . Irregular bleeding   . Migraines   . Obesity 02/13/12  . Pelvic pain 02/13/12  . Postpartum anemia 08/18/07  . 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 after loss of parents, is fine  . Vaginal Pap smear, abnormal    f/u wnl     Subjective: No new complaints. No new problems.   Objective: Vital signs in last 24 hours: Temp:  [97.6 F (36.4 C)-98 F (36.7 C)] 97.6 F (36.4 C) (02/23 0336) Pulse Rate:  [101-110] 108 (02/23 0336) Resp:  [14-18] 18 (02/23 0336) BP: (124-129)/(84-98) 129/84 (02/23 0336) SpO2:  [99 %-100 %] 100 % (02/23 0336) Weight change:  Last BM Date: 02/10/19  Intake/Output from previous day: 02/22 0701 - 02/23 0700 In: 960 [P.O.:960] Out: 75 [Urine:75]   Patient Vitals for the past 24 hrs:  BP Temp Pulse Resp SpO2  02/14/19 0336 129/84 97.6 F (36.4 C) (!) 108 18 100 %  02/13/19 2102 (!) 124/98 98 F (36.7 C) (!) 110 17 99 %  02/13/19 1431 (!) 128/91 98 F (36.7 C) (!) 101 14 100 %     Physical Exam General: No apparent distress obese HEENT: not dry Lungs: Normal effort. Lungs clear to auscultation, no crackles or wheezes. Cardiovascular: Regular rate and rhythm, no edema; resting heart rate 100-110 Abdomen: S/NT/ND; BS(+) Musculoskeletal:  Unchanged; left ankle brace Neurological: No new neurological deficits with bilateral lower  extremity weakness left greater than right.  Only trace toe extension involving the left foot Extremities no edema Skin: clear   Mental state: Alert, oriented, cooperative    Lab Results: BMET    Component Value Date/Time   NA 134 (L) 02/01/2019 0521   K 4.7 02/01/2019 0521   CL 105 02/01/2019 0521   CO2 20 (L) 02/01/2019 0521   GLUCOSE 131 (H) 02/01/2019 0521   BUN 15 02/01/2019 0521   CREATININE 0.69 02/01/2019 0521   CALCIUM 8.6 (L) 02/01/2019 0521   GFRNONAA >60 02/01/2019 0521   GFRAA >60 02/01/2019 0521   CBC    Component Value Date/Time   WBC 6.1 02/12/2019 1012   RBC 4.56 02/12/2019 1012   HGB 10.6 (L) 02/12/2019 1012   HCT 35.0 (L) 02/12/2019 1012   PLT 117 (L) 02/12/2019 1012   MCV 76.8 (L) 02/12/2019 1012   MCH 23.2 (L) 02/12/2019 1012   MCHC 30.3 02/12/2019 1012   RDW 18.2 (H) 02/12/2019 1012   LYMPHSABS 2.0 02/01/2019 0521   MONOABS 1.0 02/01/2019 0521   EOSABS 0.1 02/01/2019 0521   BASOSABS 0.0 02/01/2019 0521     Medications: I have reviewed the patient's current medications.  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  Continue CIR DVT prophylaxis.  Continue SCDs Resting tachycardia.  Anemia slightly improved.  Afebrile.  Normal O2 saturation Neurogenic bowel and bladder    Length of stay, days: 16  Marletta Lor , MD 02/14/2019, 10:19 AM

## 2019-02-14 NOTE — Progress Notes (Signed)
Patient has had a medium size BM today. Patient also c/o burning at the end of her urinating. Urine is  Dark and very cloudy.

## 2019-02-14 NOTE — Progress Notes (Signed)
At HS complained of abdominal pain. BS's hypoactive X 4 quads. Bowel program daily at 0900. Multi charted smears, Last charted large BM was 02/19. PRN vicodin given at 2120 for C/O BLE pain, left > right. Using female urinal, urine tea colored, cloudy and malodorous. Slept good.Ellen Hill A

## 2019-02-15 ENCOUNTER — Inpatient Hospital Stay (HOSPITAL_COMMUNITY): Payer: Self-pay | Admitting: Physical Therapy

## 2019-02-15 ENCOUNTER — Inpatient Hospital Stay (HOSPITAL_COMMUNITY): Payer: Self-pay | Admitting: Occupational Therapy

## 2019-02-15 NOTE — Progress Notes (Signed)
Occupational Therapy Weekly Progress Note  Patient Details  Name: Ellen Hill MRN: [id number] Date of Birth: 08-03-1971  Beginning of progress report period: 02/06/19 End of progress report period: 02/15/19  Today's Date: 02/15/2019 OT Individual Time: 1032-1100 and 1300-1429 OT Individual Time Calculation (min): 28 min and 89 min  Patient has 0 met of 3 short term goals.    Pt has made limited progress at time of report. She continues to require +2 assist for self care EOB and bedlevel, and +2 assist for Maxi sky BSC transfers. Max A for dynamic sitting balance EOB during UB ADL as well. Pt with decreased motivation to remain OOB in w/c and use BSC for bowel program vs bed. For remainder of her CIR stay, OT will increase focus on family education for safe transition home at time of d/c.   Patient continues to demonstrate the following deficits: muscle weakness, decreased cardiorespiratoy endurance and decreased sitting balance, decreased standing balance, decreased postural control and decreased balance strategies and therefore will continue to benefit from skilled OT intervention to enhance overall performance with BADL.  Patient progressing toward long term goals..  Continue plan of care.  OT Short Term Goals Week 2:  OT Short Term Goal 1 (Week 2): Pt will complete 2 self care tasks in unsupported sitting with Mod A for balance  OT Short Term Goal 1 - Progress (Week 2): Not met OT Short Term Goal 2 (Week 2): Pt will incorporate pressure relief techniques into daily routine AEB pt report  OT Short Term Goal 2 - Progress (Week 2): Not met OT Short Term Goal 3 (Week 2): Pt will be able to direct care for OOB toileting using BSC AEB pt and staff report OT Short Term Goal 3 - Progress (Week 2): Not met Week 3:  OT Short Term Goal 1 (Week 3): STGs=LTGs due to ELOS  Skilled Therapeutic Interventions/Progress Updates:    Pt greeted in w/c, premedicated for pain. Tx focus on UE  strengthening. First guided her through gentle UB stretches and then instructed pt through B UE therapeutic exercise using 4# bar x10 reps 1 set. Exercises included forward/backward rows, bicep curls, straight arm raises, stirring, overhead presses, and chest presses. Pt required 3 rest breaks. At end of session pt was left in w/c with all needs within reach. Notified NT of pts request to return to bed.   2nd Session 1:1 tx (89 min) Pt greeted in bed finishing up lunch. She did not do her bowel program this AM, so OT suggested for bowel program to be done during session. Educated pt on health benefits of bowel program, however she reported she didn't want to "waste therapy time" or use BSC for toileting. Pt reported she prefers to use the bed. Reeducated pt on role of OT and importance of using BSC for upright voiding. Questionable carryover of education. Per RN, pt refused bowel program medication this AM. Therefore, tx focus placed on LE weightbearing and UE strengthening with use of tilt table. Attempted to use tilt table built into her bed to no avail. Therefore Maxi transfer with 2 assist completed to tilt table placed in room. Supine BP 130/85 with minimal c/o dizziness. Pt positioned at 40 degrees, with BP 105/95. After 2 minutes pt with increased dizziness and BP 88/77. Pt lowered to 20 degrees, and BP 103/82 after 2 minutes. She completed UB exercises, including arm circles, straight arm raises, and arm rolls while dancing to favorite music. Pt initiated rest breaks  as needed. Positioned pt at 40 degrees again, with BP 120/93. Total Ax2 for mini squats and hip abduction/adduction exercises. Pt reported feeling "frustrated" by not being able to assist. Per pt: "I just can't feel anything." She also c/o 8/10 pain in R LE, which improved when pt was returned to supine position. Maxi sky transfer completed with 2 assist to w/c. For remainder of session worked on trunk strengthening via anterior weight shifts  at sink during oral care/grooming tasks. Max A for weight shift towards sink to spit toothpaste and mouthwash x2. Educated pt of importance of spitting in sink to work on core strength vs using kidney cup. She completed oral care, face washing, and handwashing. Pt often used sink to pull herself forward to reach needed items for stated tasks. At end of session pt agreeable to remain up in w/c. She was left in w/c with all needs within reach.  Pt reported R LE pain absolved once she sat in w/c.    Therapy Documentation Precautions:  Precautions Precautions: Back, Fall Precaution Comments: obese. No brace needed Restrictions Weight Bearing Restrictions: No Vital Signs: Therapy Vitals Temp: 98.5 F (36.9 C) Temp Source: Oral Pulse Rate: (!) 110 Resp: 17 BP: (!) 121/98 Patient Position (if appropriate): Lying Oxygen Therapy SpO2: 100 % O2 Device: Room Air   ADL:       Therapy/Group: Individual Therapy  Dailey Buccheri A Arhan Mcmanamon 02/15/2019, 7:23 AM

## 2019-02-15 NOTE — Progress Notes (Addendum)
Physical Therapy Weekly Progress Note  Patient Details  Name: Ellen Hill MRN: [id number] Date of Birth: 11/14/1971  Beginning of progress report period: February 08, 2019 End of progress report period: February 15, 2019  Today's Date: 02/15/2019 PT Individual Time: 0800-0900; 1510-1550 PT Individual Time Calculation (min): 60 min and 40 min  Patient has met 1 of 3 short term goals.  Pt has made slow progress this week and has been limited by fatigue due to ongoing issues with bowel program as well as increase in depressed mood. Pt remains motivated and participatory in therapy sessions but continues to be limited in progress due to body habitus and neurological deficits.  Patient continues to demonstrate the following deficits muscle weakness, abnormal tone and unbalanced muscle activation and decreased sitting balance, decreased postural control and decreased balance strategies and therefore will continue to benefit from skilled PT intervention to increase functional independence with mobility.  Patient not progressing toward long term goals.  See goal revision..  Plan of care revisions: Discharged car transfer goal due to lack of progress and will likely not be safe to attempt with patient.  PT Short Term Goals Week 2:  PT Short Term Goal 1 (Week 2): Pt will maintain sitting balance EOB/EOM with CGA consistently PT Short Term Goal 1 - Progress (Week 2): Progressing toward goal PT Short Term Goal 2 (Week 2): Pt will propel manual w/c x 150 ft with Supervision PT Short Term Goal 2 - Progress (Week 2): Progressing toward goal PT Short Term Goal 3 (Week 2): Pt will direct staff in how to assist with care in 75% of opportunities PT Short Term Goal 3 - Progress (Week 2): Met Week 3:  PT Short Term Goal 1 (Week 3): Pt will complete rolling in bed with mod A and use of bedrails consistently PT Short Term Goal 2 (Week 3): Pt will propel manual w/c x 150 ft with use of BUE and  Supervision PT Short Term Goal 3 (Week 3): Pt will maintain sitting balance EOM with CGA consistently  Skilled Therapeutic Interventions/Progress Updates:    Session 1: Pt received seated in bed, agreeable to PT session. See pain details below. Dependent to don BLE ACE wrap and shoes. Rolling L/R with mod A and use of bedrails for dependent donning of pants and placement of maxisky sling. Maxisky transfer bed to w/c dependently. Pt is able to don shirt with min A while seated in w/c. Session focus on activation of core musculature leaning forwards while seated in w/c with 4" step under BLE. Pt is able to maintain sitting balance in upright position with no back support, BUE on thighs x 10 sec before onset of fatigue. Pt is able to complete x 5 repetitions. Pt left seated in w/c in room with needs in reach at end of session.  Session 2: Pt received seated in w/c in room, agreeable to PT session. No complaints of pain. Pt reports feeling dizzy this PM, seated BP 106/87. Encouraged PO fluid intake. Manual w/c propulsion 2 x 50 ft, 1 x 100 ft with Supervision. BP following w/c propulsion 110/87. Pt remains dizzy throughout session. Pt appears very fatigued and lethargic this PM, requests to go back to bed at end of session. Maxisky transfer back to bed. Rolling L/R with mod A and use of bedrails to remove sling. Pt left seated in bed in care of RN for urinal use.  Therapy Documentation Precautions:  Precautions Precautions: Back, Fall Precaution Comments: obese. No  brace needed Restrictions Weight Bearing Restrictions: No Pain: Pain Assessment Pain Scale: 0-10 Pain Score: 6  Pain Type: Chronic pain Pain Location: Back Pain Orientation: Lower Pain Descriptors / Indicators: Aching;Discomfort Pain Frequency: Constant Pain Onset: On-going Patients Stated Pain Goal: 2 Pain Intervention(s): Medication (See eMAR)   Therapy/Group: Individual Therapy   Excell Seltzer, PT, DPT  02/15/2019, 11:11  AM

## 2019-02-15 NOTE — Progress Notes (Signed)
Late entry- 02/14/19 Patient seems less willing to do for her self. Called nurse to the room a couple times to move her leg for her. Patient refused to get up and go to dance group therapy. Patient wanted to "rest" yesterday therefore not getting up of dressed.   Nicholes Rough, LPN

## 2019-02-15 NOTE — Progress Notes (Signed)
Pain managed with 2 PRN vicodin. Voltaren gel applied to left knee. Total assist to use female urinal. Urine tea colored.Seems more subdued. Patrici Ranks A

## 2019-02-15 NOTE — Progress Notes (Signed)
[place] PHYSICAL MEDICINE & REHABILITATION PROGRESS NOTE  Subjective/Complaints: No issues overnite but has had burning pain with urination  ROS: Patient denies fever, rash, sore throat, blurred vision, nausea, vomiting, diarrhea, cough, shortness of breath or chest pain,  headache, or mood change.     Objective: Vital Signs: Blood pressure (!) 121/98, pulse (!) 110, temperature 98.5 F (36.9 C), temperature source Oral, resp. rate 17, height 5\' 6"  (1.676 m), SpO2 100 %. No results found. Recent Labs    02/12/19 1012  WBC 6.1  HGB 10.6*  HCT 35.0*  PLT 117*   No results for input(s): NA, K, CL, CO2, GLUCOSE, BUN, CREATININE, CALCIUM in the last 72 hours.  Physical Exam: BP (!) 121/98 (BP Location: Right Wrist)   Pulse (!) 110   Temp 98.5 F (36.9 C) (Oral)   Resp 17   Ht 5\' 6"  (1.676 m)   SpO2 100%   BMI 64.76 kg/m  Constitutional: No distress . Vital signs reviewed. HEENT: EOMI, oral membranes moist Neck: supple Cardiovascular: RRR without murmur. No JVD    Respiratory: CTA Bilaterally without wheezes or rales. Normal effort    GI: BS +, non-tender, non-distended  Musc: mild tenderness left knee  Neurological: She isalert and oriented. Motor: Bilateral UE 5/5.  RLE HF, KE 2/5, 3/5 ADF.  LLE: Trace toe flexion and extension left foot--no change today Senses pain and gross touch in both legs--no change today Skin: Skin iswarm.  Back incision not visualized Psychiatric: in good spirits  Assessment/Plan: 1. 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  Physiatrist is providing close team supervision and 24 hour management of active medical problems listed below.  Physiatrist and rehab team continue to assess barriers to discharge/monitor patient progress toward functional and medical goals  Care Tool:  Bathing    Body  parts bathed by patient: Right arm, Left arm, Chest, Abdomen, Face   Body parts bathed by helper: Front perineal area, Buttocks, Right upper leg, Left upper leg, Right lower leg, Left lower leg     Bathing assist Assist Level: 2 Helpers     Upper Body Dressing/Undressing Upper body dressing   What is the patient wearing?: Pull over shirt    Upper body assist Assist Level: Minimal Assistance - Patient > 75%    Lower Body Dressing/Undressing Lower body dressing      What is the patient wearing?: Pants, Ace wrap/stump shrinker(ace wrap BLE)     Lower body assist Assist for lower body dressing: Total Assistance - Patient < 25%     Toileting Toileting    Toileting assist Assist for toileting: 2 Helpers     Transfers Chair/bed transfer  Transfers assist     Chair/bed transfer assist level: Dependent - mechanical lift     Locomotion Ambulation   Ambulation assist   Ambulation activity did not occur: Safety/medical concerns          Walk 10 feet activity   Assist  Walk 10 feet activity did not occur: Safety/medical concerns        Walk 50 feet activity   Assist Walk 50 feet with 2 turns activity did not occur: Safety/medical concerns         Walk 150 feet activity   Assist Walk 150 feet activity did not occur: Safety/medical concerns         Walk 10 feet on uneven surface  activity   Assist Walk 10 feet on uneven surfaces activity did not occur: Safety/medical concerns         Wheelchair     Assist Will patient use wheelchair at discharge?: Yes Type of Wheelchair: Manual Wheelchair activity did not occur: Safety/medical concerns  Wheelchair assist level: Supervision/Verbal cueing Max wheelchair distance: 100'    Wheelchair 50 feet with 2 turns activity    Assist    Wheelchair 50 feet with 2 turns activity did not occur: Safety/medical concerns   Assist Level: Supervision/Verbal cueing   Wheelchair 150 feet  activity     Assist Wheelchair 150 feet activity did not occur: Safety/medical concerns          Medical Problem List and Plan: 1.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 No back brace required  --Continue CIR therapies including PT, OT    Team conf in am   -PRAFO LLE at HS 2. DVT Prophylaxis/Anticoagulation: SCDs.   Vascular study limited, but negative 3. Pain Management:Neurontin 300 mg 3 times a day---increased to 400mg  tid with benefit  - hydrocodone as needed  -prn baclofen   Left knee  Patellofemoral pain: voltaren gel helpful 4. Mood:Provide emotional support 5. Neuropsych: This patientiscapable of making decisions on herown behalf. 6. Skin/Wound Care:Routine skin checks 7. Fluids/Electrolytes/Nutrition:Routine ins and outs  Push PO 8.Proteus/Escherichia coli UTI. Neurogenic bladder Bactrim completed 2/12, recurrent symptoms of dysuria, + UA, but cx not obtained will order 9.  Super obesity. Latest BMI 64.79. Dietary follow-up  -discussed appropriate intake to help with healing and weight control  -she says the only things she can eat are chips and pickles. Family does bring in some things from outside. 10. Neurogenic bowel and bladder. Pt with GI discomfort today of unclear etiology  Flomax started on 2/8, peri-care ongoing   -continue senna-s at night  -hold miralax for now due to upset, soft stools  -working toward an AM bowel program-beginning to have some results 11.  Leukocytosis:resolved 12.?  Anemia: hgb down to 9.9  2/20  -  stool guaiac pending  -cbc pending today  -close observation for now 13. Insomnia: scheduled trazodone      LOS: 17 days A FACE TO FACE EVALUATION WAS PERFORMED  Charlett Blake 02/15/2019, 8:51 AM

## 2019-02-16 ENCOUNTER — Inpatient Hospital Stay (HOSPITAL_COMMUNITY): Payer: Self-pay | Admitting: Occupational Therapy

## 2019-02-16 ENCOUNTER — Inpatient Hospital Stay (HOSPITAL_COMMUNITY): Payer: Self-pay | Admitting: Physical Therapy

## 2019-02-16 LAB — CBC WITH DIFFERENTIAL/PLATELET
Abs Immature Granulocytes: 0.09 10*3/uL — ABNORMAL HIGH (ref 0.00–0.07)
Basophils Absolute: 0 10*3/uL (ref 0.0–0.1)
Basophils Relative: 0 %
EOS ABS: 0.2 10*3/uL (ref 0.0–0.5)
Eosinophils Relative: 2 %
HCT: 35.8 % — ABNORMAL LOW (ref 36.0–46.0)
Hemoglobin: 10.7 g/dL — ABNORMAL LOW (ref 12.0–15.0)
IMMATURE GRANULOCYTES: 1 %
Lymphocytes Relative: 16 %
Lymphs Abs: 1.5 10*3/uL (ref 0.7–4.0)
MCH: 23.1 pg — ABNORMAL LOW (ref 26.0–34.0)
MCHC: 29.9 g/dL — ABNORMAL LOW (ref 30.0–36.0)
MCV: 77.3 fL — ABNORMAL LOW (ref 80.0–100.0)
Monocytes Absolute: 0.7 10*3/uL (ref 0.1–1.0)
Monocytes Relative: 7 %
Neutro Abs: 6.9 10*3/uL (ref 1.7–7.7)
Neutrophils Relative %: 74 %
Platelets: 280 10*3/uL (ref 150–400)
RBC: 4.63 MIL/uL (ref 3.87–5.11)
RDW: 17.8 % — ABNORMAL HIGH (ref 11.5–15.5)
WBC: 9.3 10*3/uL (ref 4.0–10.5)
nRBC: 0.2 % (ref 0.0–0.2)

## 2019-02-16 NOTE — Progress Notes (Signed)
Pt comfortable at moment. Pt was continent /incontinent last night. Offered urinal Q2H WA and upon waking. Norco with relief for pain. Resting comfortable at present

## 2019-02-16 NOTE — Progress Notes (Signed)
Occupational Therapy Session Note  Patient Details  Name: Quanetta Truss MRN: [id number] Date of Birth: 1971/02/25  Today's Date: 02/16/2019 OT Individual Time: 1415-1500 OT Individual Time Calculation (min): 45 min    Short Term Goals: Week 3:  OT Short Term Goal 1 (Week 3): STGs=LTGs due to ELOS  Skilled Therapeutic Interventions/Progress Updates:    Patient in w/c finishing with PT at start of pm session today.  She c/o of fatigue but is willing to participate.  Requires max A to reposition in w/c.  Completed trunk forward flexion activities with arms on table - max A to pull trunk forward, min A to maintain and return to semi reclined position.  Completed light activity at table top level.  Returned to bed with OH lift system max A of 2.  Patient limited by fatigue this afternoon.  She remained in bed with call bell and phone in reach.  Therapy Documentation Precautions:  Precautions Precautions: Back, Fall Precaution Comments: obese. No brace needed Restrictions Weight Bearing Restrictions: No General:   Vital Signs:  Pain: Pain Assessment Pain Scale: 0-10 Pain Score: 3  Pain Location: Back Pain Intervention(s): Repositioned   Therapy/Group: Individual Therapy  Carlos Levering 02/16/2019, 3:13 PM

## 2019-02-16 NOTE — Patient Care Conference (Signed)
Inpatient RehabilitationTeam Conference and Plan of Care Update Date: 02/17/2019   Time: 10:00 AM    Patient Name: Ellen Hill [facility]      Medical Record Number: [id number]  Date of Birth: Jul 01, 1971 Sex: Female         Room/Bed: 4W14C/4W14C-01 Payor Info: Payor: [place] / Plan: [facility] PPO / Product Type: *No Product type* /    Admitting Diagnosis: Thoracic myelopathy incomplete sci  Admit Date/Time:  01/29/2019  1:39 PM Admission Comments: No comment available   Primary Diagnosis:  <principal problem not specified> Principal Problem: <principal problem not specified>  Patient Active Problem List   Diagnosis Date Noted  . Anemia of chronic disease   . Leukocytosis   . Neurogenic bowel   . Neurogenic bladder   . E. coli UTI   . Myelopathy ([place]) 01/29/2019  . 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 Lumbar back pain with radiculopathy affecting lower extremity 12/08/2018  . Dysmenorrhea 04/13/2012  . Class 3 severe obesity due to excess calories without serious comorbidity with body mass index (BMI) of 60.0 to 69.9 in adult ([place]) 04/13/2012  . Anemia 04/13/2012  . Fibroids 04/13/2012  . Menorrhagia 03/07/2012    Expected Discharge Date: Expected Discharge Date: 03/02/19  Team Members Present: Physician leading conference: Dr. Delice Lesch Social Worker Present: Alfonse Alpers, LCSW Nurse Present: Other (comment)(Tonia G., LPN) PT Present: Lavone Nian, PT OT Present: Other (comment)(Sandra Rosana Hoes, OT) SLP Present: Charolett Bumpers, SLP PPS Coordinator present : Gunnar Fusi     Current Status/Progress Goal Weekly Team Focus  Medical   Minimal improvement in lower extremity strength  Manage pain, reduce medical complications associated with immobility  Increase bowel and bladder continence   Bowel/Bladder   Continent of bowel continent and incontinent of urinal LBM 02/23  regain continence of B/B normal bowel pattern  B/B program    Swallow/Nutrition/  Hydration             ADL's   mod A rolling, +2 LB self care bed level, UB self care min - mod A, maxi sky transfers+ 2. Pt on night bath  mod A LB dressing and toileting with min A for overall goals  sitting balance, self care, strengthening, pt/family edu   Mobility   mod A rolling, dependent for transfers via maxisky, S w/c mobility x 100'  min A overall, will likely downgrade  sitting balance to progress to more functional transfers   Communication             Safety/Cognition/ Behavioral Observations            Pain   c/o pain LLE managed with norco, scheduled gabapentin, and prn baclofen  pain <2/10  assess pain q shift and prn- medicate prn assess for relief   Skin   healing fissure to buttock fold  no further skin issues  assess skin qshift and prn    Rehab Goals Patient on target to meet rehab goals: No Rehab Goals Revised: Goals will need to be downgraded to mod A. *See Care Plan and progress notes for long and short-term goals.     Barriers to Discharge  Current Status/Progress Possible Resolutions Date Resolved   Physician    Medical stability;Medication compliance  Has refused a program  Symptoms consistent with recurrent UTI  Evaluate dysuria      Nursing                  PT  Inaccessible home  environment;Decreased caregiver support;Medical stability;Home environment access/layout;Incontinence;Weight                 OT                  SLP                SW                Discharge Planning/Teaching Needs:  Pt's sister was able to obtain a first floor, one level unit, but we are waiting to receive home measurement sheet to see how pt will be able to move about the home.  Pt still plans to go home with her family to provide mod A level of care.  CSW will confirm this with pt's sister.  Family education to occur with all of pt's caregivers prior to pt's d/c.   Team Discussion:  MD has ordered UA/culture due to pain with urination and pt was started on  Keflex.  Pt has anemia and repeat labs were ordered.  Voltaren gel for knee pain ordered also.  Pt has some clear drainage from her back sutures, but has no fever.  Pt is refusing suppositories and enemas and is only using sorbitol and reports it is helping her with BMs.  OT is using maxi-sky for txs and is min/mod for UB ADLs and mod/max for LB.  Pt will need hospital bed, bariatric hoyer lift, and ambulance transport home.  Therapists still need home measurement sheet.  P tis mod A for rolling in bed and min A PT goals will be downgraded to mod A.  Pt to be measured for custom w/c.  Family needs to be on board for mod A care at home and will need to come in for family education.  Revisions to Treatment Plan:  none    Continued Need for Acute Rehabilitation Level of Care: The patient requires daily medical management by a physician with specialized training in physical medicine and rehabilitation for the following conditions: Daily direction of a multidisciplinary physical rehabilitation program to ensure safe treatment while eliciting the highest outcome that is of practical value to the patient.: Yes Daily medical management of patient stability for increased activity during participation in an intensive rehabilitation regime.: Yes Daily analysis of laboratory values and/or radiology reports with any subsequent need for medication adjustment of medical intervention for : Post surgical problems;Urological problems;Other   I attest that I was present, lead the team conference, and concur with the assessment and plan of the team.   Floyd Wade, Silvestre Mesi 02/17/2019, 9:38 AM

## 2019-02-16 NOTE — Progress Notes (Signed)
Physical Therapy Session Note  Patient Details  Name: Ellen Hill MRN: [id number] Date of Birth: July 02, 1971  Today's Date: 02/16/2019 PT Individual Time: 1300-1415 PT Individual Time Calculation (min): 75 min   Short Term Goals: Week 3:  PT Short Term Goal 1 (Week 3): Pt will complete rolling in bed with mod A and use of bedrails consistently PT Short Term Goal 2 (Week 3): Pt will propel manual w/c x 150 ft with use of BUE and Supervision PT Short Term Goal 3 (Week 3): Pt will maintain sitting balance EOM with CGA consistently  Skilled Therapeutic Interventions/Progress Updates:    Pt received seated in bed, agreeable to PT session. Pt appears very lethargic this date and reports she has felt fatigued since taking her sleeping pill the previous night. Pt missed her lunch due to being asleep. Rolling L/R with max A to place maxisky sling. Maxisky transfer bed to w/c. Pt is is setup A to attempt to eat some lunch, pt unwilling to eat lunch that was delivered so provided pt with some yogurt to eat. Maxisky transfer w/c to/from mat table. Sitting balance EOM with posterior support, focus on leaning forwards onto therapy ball. Pt reports some pulling in her mid-back at surgical site, pain not rated. Pt is able to perform anterior leans x 5 with min A. Pt reports urge to urinate. Assisted pt back to bed for dependent use of female urinal. Pt is able to have some urination continently with urinal, has some incontinence in brief. Pt is dependent for pericare and brief change. Maxisky transfer back to w/c. Pt left seated in w/c in care of OT for next therapy session.  Therapy Documentation Precautions:  Precautions Precautions: Back, Fall Precaution Comments: obese. No brace needed Restrictions Weight Bearing Restrictions: No    Therapy/Group: Individual Therapy  Excell Seltzer, PT, DPT  02/16/2019, 2:59 PM

## 2019-02-16 NOTE — Progress Notes (Signed)
Occupational Therapy Session Note  Patient Details  Name: Ellen Hill MRN: [id number] Date of Birth: 06/30/1971  Today's Date: 02/16/2019 OT Individual Time: [phone] OT Individual Time Calculation (min): 75 min    Short Term Goals: Week 3:  OT Short Term Goal 1 (Week 3): STGs=LTGs due to ELOS  Skilled Therapeutic Interventions/Progress Updates:    Patient in bed upon arrival, denies pain in this position and is ready for therapy.   LB dressing dependent due to limited reach (she attempts to use a reacher but is unable to manage due to body habitus and sliding in bed - feet rest on foot board limiting mobility)  Eating and UB dressing completed sitting at edge of bed.  She requires min A to maintain unsupported sitting with bilateral feet on platform.  Eating with assist to maintain sitting only, UB dressing with min A to straighten shirt.  She tolerates unsupported sitting for 20 minutes.  Bed mobility - rolling with max A, supine to/from SSP with max A of 2.  Completed bed to wc with overhead lift system.  Max A to complete lateral leans and side to side LE/hip mobility for w/c positioning.  Completed trunk/core strength activities seated in w/c, UB reaching and conditioning activities and reviewed weight shifts/skin care at w/c position.  She demonstrates good recall and motivation today.  Patient remained in w/c with call bell and tray table in reach.    Therapy Documentation Precautions:  Precautions Precautions: Back, Fall Precaution Comments: obese. No brace needed Restrictions Weight Bearing Restrictions: No General:   Vital Signs:  Pain: Pain Assessment Pain Scale: 0-10 Pain Score: 4  Pain Location: Back Pain Intervention(s): Repositioned   Therapy/Group: Individual Therapy  Carlos Levering 02/16/2019, 12:00 PM

## 2019-02-16 NOTE — Progress Notes (Signed)
[place] PHYSICAL MEDICINE & REHABILITATION PROGRESS NOTE  Subjective/Complaints: Poor sensation/hypersensitivity on L Foot  ROS: Patient denies fever, rash, sore throat, blurred vision, nausea, vomiting, diarrhea, cough, shortness of breath or chest pain,  headache, or mood change.     Objective: Vital Signs: Blood pressure 120/81, pulse (!) 102, temperature 98 F (36.7 C), resp. rate 18, height _0  (1.676 m), SpO2 100 %. No results found. No results for input(s): WBC, HGB, HCT, PLT in the last 72 hours. No results for input(s): NA, K, CL, CO2, GLUCOSE, BUN, CREATININE, CALCIUM in the last 72 hours.  Physical Exam: BP 120/81 (BP Location: Right Arm)   Pulse (!) 102   Temp 98 F (36.7 C)   Resp 18   Ht _1  (1.676 m)   SpO2 100%   BMI 64.76 kg/m  Constitutional: No distress . Vital signs reviewed. HEENT: EOMI, oral membranes moist Neck: supple Cardiovascular: RRR without murmur. No JVD    Respiratory: CTA Bilaterally without wheezes or rales. Normal effort    GI: BS +, non-tender, non-distended  Musc: mild tenderness left knee  Neurological: She isalert and oriented. Motor: Bilateral UE 5/5.  RLE HF, KE 2/5, 3/5 ADF.  LLE: Trace toe flexion and extension left foot--no change today Senses pain and gross touch in both legs--no change today Skin: Skin iswarm.  Back incision not visualized Psychiatric: in good spirits  Assessment/Plan: 1. 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  Physiatrist is providing close team supervision and 24 hour management of active medical problems listed below.  Physiatrist and rehab team continue to assess barriers to discharge/monitor patient progress toward functional and medical goals  Care Tool:  Bathing    Body parts bathed by patient: Right arm, Left arm, Chest, Abdomen, Face   Body  parts bathed by helper: Front perineal area, Buttocks, Right upper leg, Left upper leg, Right lower leg, Left lower leg     Bathing assist Assist Level: 2 Helpers     Upper Body Dressing/Undressing Upper body dressing   What is the patient wearing?: Pull over shirt    Upper body assist Assist Level: Minimal Assistance - Patient > 75%    Lower Body Dressing/Undressing Lower body dressing      What is the patient wearing?: Pants, Ace wrap/stump shrinker(ace wrap BLE)     Lower body assist Assist for lower body dressing: Total Assistance - Patient < 25%     Toileting Toileting    Toileting assist Assist for toileting: 2 Helpers     Transfers Chair/bed transfer  Transfers assist     Chair/bed transfer assist level: Dependent - mechanical lift     Locomotion Ambulation   Ambulation assist   Ambulation activity did not occur: Safety/medical concerns          Walk 10 feet activity   Assist  Walk 10 feet activity did not occur: Safety/medical concerns        Walk 50 feet activity   Assist Walk 50 feet with 2 turns activity did not occur: Safety/medical concerns         Walk 150 feet activity   Assist Walk 150 feet activity did not occur: Safety/medical concerns         Walk 10 feet on uneven surface  activity   Assist Walk 10 feet on uneven surfaces activity did not occur: Safety/medical concerns  Wheelchair     Assist Will patient use wheelchair at discharge?: Yes Type of Wheelchair: Manual Wheelchair activity did not occur: Safety/medical concerns  Wheelchair assist level: Supervision/Verbal cueing Max wheelchair distance: 100'    Wheelchair 50 feet with 2 turns activity    Assist    Wheelchair 50 feet with 2 turns activity did not occur: Safety/medical concerns   Assist Level: Supervision/Verbal cueing   Wheelchair 150 feet activity     Assist Wheelchair 150 feet activity did not occur:  Safety/medical concerns          Medical Problem List and Plan: 1.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 No back brace required  --Continue CIR therapies including PT, OT    Team conference today please see physician documentation under team conference tab, met with team face-to-face to discuss problems,progress, and goals. Formulized individual treatment plan based on medical history, underlying problem and comorbidities.   -PRAFO LLE at HS 2. DVT Prophylaxis/Anticoagulation: SCDs.   Vascular study limited, but negative 3. Pain Management:Neurontin 300 mg 3 times a day---increased to 484m tid with benefit  - hydrocodone as needed  -prn baclofen   Left knee  Patellofemoral pain: voltaren gel helpful 4. Mood:Provide emotional support 5. Neuropsych: This patientiscapable of making decisions on herown behalf. 6. Skin/Wound Care:Routine skin checks 7. Fluids/Electrolytes/Nutrition:Routine ins and outs  Push PO 8.Proteus/Escherichia coli UTI. Recurrent Neurogenic bladder Bactrim completed 2/12, recurrent symptoms of dysuria, + UA, Cx just obtained, start Keflex 9.  Super obesity. Latest BMI 64.79. Dietary follow-up  -discussed appropriate intake to help with healing and weight control  -she says the only things she can eat are chips and pickles. Family does bring in some things from outside. 10. Neurogenic bowel and bladder. Pt with GI discomfort today of unclear etiology  Flomax started on 2/8, peri-care ongoing   -continue senna-s at night  -hold miralax for now due to upset, soft stools  -working toward an AM bowel program-beginning to have some results 12.?  Anemia: hgb down to 9.9  2/20  -repeat CBC 13. Insomnia: scheduled trazodone      LOS: 18 days A FACE TO FACE EVALUATION WAS PERFORMED  ACharlett Blake2/25/2020, 8:20 AM

## 2019-02-17 ENCOUNTER — Inpatient Hospital Stay (HOSPITAL_COMMUNITY): Payer: Self-pay | Admitting: Physical Therapy

## 2019-02-17 ENCOUNTER — Inpatient Hospital Stay (HOSPITAL_COMMUNITY): Payer: Self-pay

## 2019-02-17 MED ORDER — CEPHALEXIN 250 MG PO CAPS
250.0000 mg | ORAL_CAPSULE | Freq: Three times a day (TID) | ORAL | Status: AC
  Administered 2019-02-17 – 2019-02-23 (×21): 250 mg via ORAL
  Filled 2019-02-17 (×20): qty 1

## 2019-02-17 NOTE — Progress Notes (Signed)
Physical Therapy Session Note  Patient Details  Name: Ellen Hill MRN: [id number] Date of Birth: 1971/04/21  Today's Date: 02/17/2019 PT Individual Time: 0800-0930 PT Individual Time Calculation (min): 90 min   Short Term Goals: Week 3:  PT Short Term Goal 1 (Week 3): Pt will complete rolling in bed with mod A and use of bedrails consistently PT Short Term Goal 2 (Week 3): Pt will propel manual w/c x 150 ft with use of BUE and Supervision PT Short Term Goal 3 (Week 3): Pt will maintain sitting balance EOM with CGA consistently  Skilled Therapeutic Interventions/Progress Updates:    Pt received seated in bed, agreeable to PT session. No complaints of pain. Dependent to don BLE ACE wrap. Rolling L/R with max A for donning pants dependently and for placement of maxisky sling. Maxisky transfer to w/c. Dependent transport via w/c to therapy gym for time conservation. Session focus on initiating slide board transfer w/c to mat table. Pt is dependent for placement of slide board. Pt requires min A for anterior lean for forward weight shift before initiating transfer. Pt is able to perform transfer about halfway from w/c to mat table before onset of fatigue and unable to complete transfer. One therapist positioned in front of patient and provides CGA for balance, one therapist behind patient to assist with UE placement and anterior weight shift, 4" curb step under BLE for support. Pt is able to perform slide board transfer from halfway on slide board back into w/c. Per RN pt to be returned to bed at end of session for bowel program. Maxisky transfer back to bed. Rolling L/R with max A and use of bedrails for removal of sling. Pt left seated in bed with needs in reach.  Therapy Documentation Precautions:  Precautions Precautions: Back, Fall Precaution Comments: obese. No brace needed Restrictions Weight Bearing Restrictions: No    Therapy/Group: Individual Therapy   Excell Seltzer, PT,  DPT  02/17/2019, 1:09 PM

## 2019-02-17 NOTE — Progress Notes (Signed)
Occupational Therapy Session Note  Patient Details  Name: Ellen Hill MRN: [id number] Date of Birth: 1971/07/07  Today's Date: 02/17/2019 OT Individual Time: 1545-1630 OT Individual Time Calculation (min): 45 min    Short Term Goals: Week 3:  OT Short Term Goal 1 (Week 3): STGs=LTGs due to ELOS  Skilled Therapeutic Interventions/Progress Updates:    Session focused on B UE strengthening/endurance to assist with ADL transfers and bed mobility. Pt was received sitting up in w/c with c/o pain as described below but willing to participate. Pt was transported down to therapy gym for time management. Pt held a 4 # dowel with additional resistance from resistance band and completed full UE circuit with a focus on triceps, lats, and scapular stabilizers to increase independence with pushing up from surfaces. Pt returned to her room and with use of maxi sky was transferred back to bed. Pt rolled R and L with mod A d/t fatigue to remove sling. Pt left supine with all needs met.    Therapy Documentation Precautions:  Precautions Precautions: Back, Fall Precaution Comments: obese. No brace needed Restrictions Weight Bearing Restrictions: No Pain: Pain Assessment Pain Scale: 0-10 Pain Score: 0-No pain Pain Type: Chronic pain Pain Location: Back Pain Orientation: Right;Left Pain Descriptors / Indicators: Aching Pain Frequency: Constant Pain Intervention(s): Medication (See eMAR)   Therapy/Group: Individual Therapy  Curtis Sites 02/17/2019, 5:04 PM

## 2019-02-17 NOTE — Progress Notes (Signed)
Physical Therapy Session Note  Patient Details  Name: Ellen Hill MRN: [id number] Date of Birth: Aug 23, 1971  Today's Date: 02/17/2019 PT Individual Time: 1300-1400 PT Individual Time Calculation (min): 60 min   Short Term Goals: Week 3:  PT Short Term Goal 1 (Week 3): Pt will complete rolling in bed with mod A and use of bedrails consistently PT Short Term Goal 2 (Week 3): Pt will propel manual w/c x 150 ft with use of BUE and Supervision PT Short Term Goal 3 (Week 3): Pt will maintain sitting balance EOM with CGA consistently  Skilled Therapeutic Interventions/Progress Updates:    Pt reports some frustration with enema/bowel situation and worried that she may go again once up. Encouragement provided and options offered if that were to happen. Pt thankful. Pt engaged in rolling in bed for donning of pants and placement of sling for lift requiring max assist for rolling and use of bed rails - cues for technique. Total +2 for use of maxi sky to transfer bed -> w/c and then w/c <> mat. +2 assist for reposition in w/c and on EOM with focus on pt using anterior and lateral weightshifting to assist. On mat, focused on initiation of slideboard transfer training with focus on transitional movement pattern retraining and postural control re-training. Pt able to perform propped on elbow with max assist to L for positioning of slideboard (+2 needed due to one person to assist with trunk and second person to assist with placement of slideboard and lifting of leg). Pt able to perform several repetitions of scooting to the R with focus on technique but unable to fully complete transfer (~30% of the way) due to movement of the board, pt complaints of discomfort, and then pt appearing anxious and noted dizziness. Ultimately requires use of maxi sky lift back to w/c and assist for repositioning as described above. Pt making good progress with ability to problem solve and work on scooting technique to advance  slideboard transfer (using curb step under feet and elevated mat this session to allow for more downhill transfer) Will benefit from continued practice.   Therapy Documentation Precautions:  Precautions Precautions: Back, Fall Precaution Comments: obese. No brace needed Restrictions Weight Bearing Restrictions: No   Pain: Reports pain throughout session in various locations depending on movements - alleviated with repositioning and rest breaks or pain that pt reports is ongoing.     Therapy/Group: Individual Therapy  Canary Brim Ivory Broad, PT, DPT, CBIS  02/17/2019, 3:28 PM

## 2019-02-17 NOTE — Plan of Care (Signed)
Has been refusing bowel program, no BM since 2/23. Agreed to enema in bed today.

## 2019-02-17 NOTE — Progress Notes (Signed)
PHYSICAL MEDICINE & REHABILITATION PROGRESS NOTE  Subjective/Complaints: Poor sensation/hypersensitivity on L Foot  ROS: Patient denies fever, rash, sore throat, blurred vision, nausea, vomiting, diarrhea, cough, shortness of breath or chest pain,  headache, or mood change.     Objective: Vital Signs: Blood pressure 128/88, pulse 90, temperature 98.6 F (37 C), temperature source Oral, resp. rate 19, height 5\' 6"  (1.676 m), SpO2 100 %. No results found. Recent Labs    02/16/19 0946  WBC 9.3  HGB 10.7*  HCT 35.8*  PLT 280   No results for input(s): NA, K, CL, CO2, GLUCOSE, BUN, CREATININE, CALCIUM in the last 72 hours.  Physical Exam: BP 128/88 (BP Location: Right Wrist)   Pulse 90   Temp 98.6 F (37 C) (Oral)   Resp 19   Ht 5\' 6"  (1.676 m)   SpO2 100%   BMI 64.76 kg/m  Constitutional: No distress . Vital signs reviewed. HEENT: EOMI, oral membranes moist Neck: supple Cardiovascular: RRR without murmur. No JVD    Respiratory: CTA Bilaterally without wheezes or rales. Normal effort    GI: BS +, non-tender, non-distended  Musc: mild tenderness left knee  Neurological: She isalert and oriented. Motor: Bilateral UE 5/5.  RLE HF, KE 2/5, 3/5 ADF.  LLE: Trace toe flexion and extension left foot--no change today Senses pain and gross touch in both legs--no change today Skin: Skin iswarm.  Back incision not visualized Psychiatric: in good spirits  Assessment/Plan: 1. 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  Physiatrist is providing close team supervision and 24 hour management of active medical problems listed below.  Physiatrist and rehab team continue to assess barriers to discharge/monitor patient progress toward functional and medical goals  Care Tool:  Bathing    Body parts bathed by patient: Right arm, Left  arm, Chest, Abdomen, Face   Body parts bathed by helper: Front perineal area, Buttocks, Right upper leg, Left upper leg, Right lower leg, Left lower leg     Bathing assist Assist Level: 2 Helpers     Upper Body Dressing/Undressing Upper body dressing   What is the patient wearing?: Pull over shirt    Upper body assist Assist Level: Minimal Assistance - Patient > 75%    Lower Body Dressing/Undressing Lower body dressing      What is the patient wearing?: Pants     Lower body assist Assist for lower body dressing: Total Assistance - Patient < 25%     Toileting Toileting    Toileting assist Assist for toileting: 2 Helpers     Transfers Chair/bed transfer  Transfers assist     Chair/bed transfer assist level: Dependent - mechanical lift     Locomotion Ambulation   Ambulation assist   Ambulation activity did not occur: Safety/medical concerns          Walk 10 feet activity   Assist  Walk 10 feet activity did not occur: Safety/medical concerns        Walk 50 feet activity   Assist Walk 50 feet with 2 turns activity did not occur: Safety/medical concerns         Walk 150 feet activity   Assist Walk 150 feet activity did not occur: Safety/medical concerns         Walk 10 feet on uneven surface  activity   Assist Walk 10 feet on uneven surfaces activity did not occur:  Safety/medical concerns         Wheelchair     Assist Will patient use wheelchair at discharge?: Yes Type of Wheelchair: Manual Wheelchair activity did not occur: Safety/medical concerns  Wheelchair assist level: Supervision/Verbal cueing Max wheelchair distance: 100'    Wheelchair 50 feet with 2 turns activity    Assist    Wheelchair 50 feet with 2 turns activity did not occur: Safety/medical concerns   Assist Level: Supervision/Verbal cueing   Wheelchair 150 feet activity     Assist Wheelchair 150 feet activity did not occur: Safety/medical  concerns          Medical Problem List and Plan: 1.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 No back brace required  --Continue CIR therapies including PT, OT     Discussed with PT, still using maxi move Will need 2 person assist at home -PRAFO LLE at HS 2. DVT Prophylaxis/Anticoagulation: SCDs.   Vascular study limited, but negative 3. Pain Management:Neurontin 300 mg 3 times a day---increased to 400mg  tid with benefit  - hydrocodone as needed  -prn baclofen   Left knee  Patellofemoral pain: voltaren gel helpful 4. Mood:Provide emotional support 5. Neuropsych: This patientiscapable of making decisions on herown behalf. 6. Skin/Wound Care:Routine skin checks 7. Fluids/Electrolytes/Nutrition:Routine ins and outs  Push PO 8.Proteus/Escherichia coli UTI. Recurrent Neurogenic bladder Bactrim completed 2/12, recurrent symptoms of dysuria, + UA, Cx prelimn proteus based on prior C and S will , start Keflex 9.  Super obesity. Latest BMI 64.79. Dietary follow-up  -discussed appropriate intake to help with healing and weight control  -she says the only things she can eat are chips and pickles. Family does bring in some things from outside. 10. Neurogenic bowel and bladder. Pt with GI discomfort today of unclear etiology  Flomax started on 2/8, peri-care ongoing   -continue senna-s at night  -hold miralax for now due to upset, soft stools  -working toward an AM bowel program-beginning to have some results 12.?  Anemia: hgb down to 9.9  2/20  -repeat CBC, improved Hgb to 10.713. Insomnia: scheduled trazodone      LOS: 19 days A FACE TO FACE EVALUATION WAS PERFORMED  Charlett Blake 02/17/2019, 9:10 AM

## 2019-02-17 NOTE — Progress Notes (Signed)
Social Work Patient ID: [name], female   DOB: 1971-10-31, 48 y.o.   MRN: [id number]   CSW met with pt 02-16-19 to update her on team conference discussion and that d/c date remains 03-02-19, but that her goals will be downgraded to mod A.  CSW asked pt who would be her caregivers, besides [name] when she is at work.  Pt stated her other sister.  CSW explained that we would need to do family education for a couple of days prior to d/c to make sure that caregivers feel comfortable with pt's needed level of care.  Also shared with pt that they will need to use a hospital bed, bariatric hoyer lift, custom w/c, etc at home.  Reminded pt that we need home measurement sheet back from [employer] and she will ask her about it.  Pt remains motivated to rehabilitate and work toward getting home, as this is her first preference for d/c disposition.  CSW will also talk with [name] about the above.

## 2019-02-18 ENCOUNTER — Inpatient Hospital Stay (HOSPITAL_COMMUNITY): Payer: Self-pay | Admitting: Occupational Therapy

## 2019-02-18 ENCOUNTER — Inpatient Hospital Stay (HOSPITAL_COMMUNITY): Payer: Self-pay | Admitting: Physical Therapy

## 2019-02-18 LAB — URINE CULTURE: Culture: >=100000 — AB

## 2019-02-18 MED ORDER — FLEET ENEMA 7-19 GM/118ML RE ENEM
1.0000 | ENEMA | Freq: Every day | RECTAL | Status: DC
  Administered 2019-02-19 – 2019-03-02 (×7): 1 via RECTAL
  Filled 2019-02-18 (×8): qty 1

## 2019-02-18 NOTE — Progress Notes (Signed)
Occupational Therapy Session Note  Patient Details  Name: Ellen Hill MRN: [id number] Date of Birth: 10/06/1971  Today's Date: 02/18/2019 OT Individual Time: 1100-1200 OT Individual Time Calculation (min): 60 min    Short Term Goals: Week 3:  OT Short Term Goal 1 (Week 3): STGs=LTGs due to ELOS  Skilled Therapeutic Interventions/Progress Updates:    Patient seated in w/c upon arrival and ready for therapy session.  Patient requires assistance for w/c propulsion to therapy gym.  Completed UE exercises to include 2 x 5 minutes of UE ergometer, core/trunk and unsupported sitting activities w/c level with focus on pelvis mobility, weight shift, abdominal strength, scapular strength.  Good tolerance overall with exercises today although patient appears sleepy.  Returned to room and transferred back to bed with [facility] lift system.  She has call bell and tray table in reach at close of session.    Therapy Documentation Precautions:  Precautions Precautions: Back, Fall Precaution Comments: obese. No brace needed Restrictions Weight Bearing Restrictions: No General:   Vital Signs:  Pain: Pain Assessment Pain Scale: 0-10 Pain Score: 3  A  Other Treatments:     Therapy/Group: Individual Therapy  Carlos Levering 02/18/2019, 12:19 PM

## 2019-02-18 NOTE — Progress Notes (Signed)
[facility] PHYSICAL MEDICINE & REHABILITATION PROGRESS NOTE  Subjective/Complaints: Discussed bowel program and effects of spinal cord injury on bowel fxn  ROS: Patient denies cp, SOB, N/V/D     Objective: Vital Signs: Blood pressure 128/88, pulse (!) 102, temperature 97.8 F (36.6 C), temperature source Oral, resp. rate 18, height 5\' 6"  (1.676 m), SpO2 100 %. No results found. Recent Labs    02/16/19 0946  WBC 9.3  HGB 10.7*  HCT 35.8*  PLT 280   No results for input(s): NA, K, CL, CO2, GLUCOSE, BUN, CREATININE, CALCIUM in the last 72 hours.  Physical Exam: BP 128/88 (BP Location: Right Arm)   Pulse (!) 102   Temp 97.8 F (36.6 C) (Oral)   Resp 18   Ht 5\' 6"  (1.676 m)   SpO2 100%   BMI 64.76 kg/m  Constitutional: No distress . Vital signs reviewed. HEENT: EOMI, oral membranes moist Neck: supple Cardiovascular: RRR without murmur. No JVD    Respiratory: CTA Bilaterally without wheezes or rales. Normal effort    GI: BS +, non-tender, non-distended  Musc: mild tenderness left knee  Neurological: She isalert and oriented. Motor: Bilateral UE 5/5.  RLE HF, KE 2/5, 3/5 ADF.  LLE: Trace toe flexion and extension left foot--no change today Senses pain and gross touch in both legs--no change today Skin: Skin iswarm.  Back incision not visualized Psychiatric: in good spirits  Assessment/Plan: 1. 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  Physiatrist is providing close team supervision and 24 hour management of active medical problems listed below.  Physiatrist and rehab team continue to assess barriers to discharge/monitor patient progress toward functional and medical goals  Care Tool:  Bathing    Body parts bathed by patient: Right arm, Left arm, Chest, Abdomen, Face   Body parts bathed by helper: Front perineal area,  Buttocks, Right upper leg, Left upper leg, Right lower leg, Left lower leg     Bathing assist Assist Level: 2 Helpers     Upper Body Dressing/Undressing Upper body dressing   What is the patient wearing?: Pull over shirt    Upper body assist Assist Level: Minimal Assistance - Patient > 75%    Lower Body Dressing/Undressing Lower body dressing      What is the patient wearing?: Pants     Lower body assist Assist for lower body dressing: Total Assistance - Patient < 25%     Toileting Toileting    Toileting assist Assist for toileting: 2 Helpers     Transfers Chair/bed transfer  Transfers assist     Chair/bed transfer assist level: Dependent - mechanical lift     Locomotion Ambulation   Ambulation assist   Ambulation activity did not occur: Safety/medical concerns          Walk 10 feet activity   Assist  Walk 10 feet activity did not occur: Safety/medical concerns        Walk 50 feet activity   Assist Walk 50 feet with 2 turns activity did not occur: Safety/medical concerns         Walk 150 feet activity   Assist Walk 150 feet activity did not occur: Safety/medical concerns         Walk 10 feet on uneven surface  activity   Assist Walk 10 feet on uneven surfaces activity did not occur: Safety/medical concerns  Wheelchair     Assist Will patient use wheelchair at discharge?: Yes Type of Wheelchair: Manual Wheelchair activity did not occur: Safety/medical concerns  Wheelchair assist level: Dependent - Patient 0% Max wheelchair distance: 100'    Wheelchair 50 feet with 2 turns activity    Assist    Wheelchair 50 feet with 2 turns activity did not occur: Safety/medical concerns   Assist Level: Dependent - Patient 0%   Wheelchair 150 feet activity     Assist Wheelchair 150 feet activity did not occur: Safety/medical concerns          Medical Problem List and Plan: 1.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 No back brace required  --Continue CIR therapies including PT, OT     still using maxi move Will need 2 person assist at home -PRAFO LLE at HS 2. DVT Prophylaxis/Anticoagulation: SCDs.   Vascular study limited, but negative 3. Pain Management:Neurontin 300 mg 3 times a day---increased to 400mg  tid with benefit  - hydrocodone as needed  -prn baclofen  Patellofemoral pain: voltaren gel helpful 4. Mood:Provide emotional support 5. Neuropsych: This patientiscapable of making decisions on herown behalf. 6. Skin/Wound Care:Routine skin checks 7. Fluids/Electrolytes/Nutrition:Routine ins and outs  Push PO 8.Proteus/Escherichia coli UTI. Recurrent Neurogenic bladder Bactrim completed 2/12, recurrent symptoms of dysuria, + UA, Cx proteus based on  C and S will , Cont Keflex 9.  Super obesity. Latest BMI 64.79. Dietary follow-up  -discussed appropriate intake to help with healing and weight control  -she says the only things she can eat are chips and pickles. Family does bring in some things from outside. 10. Neurogenic bowel and bladder. Pt with GI discomfort today of unclear etiology  Flomax started on 2/8, peri-care ongoing   -continue senna-s at night  -hold miralax for now due to upset, soft stools  -daily dulcolax 12.?  Anemia: hgb down to 9.9  2/20  -repeat CBC, improved Hgb to 10.713. Insomnia: scheduled trazodone      LOS: 20 days A FACE TO FACE EVALUATION WAS PERFORMED  Charlett Blake 02/18/2019, 8:49 AM

## 2019-02-18 NOTE — Progress Notes (Signed)
Occupational Therapy Session Note  Patient Details  Name: Sarai January MRN: [id number] Date of Birth: [birth year]  Today's Date: 02/18/2019 OT Individual Time: 0700-0800 OT Individual Time Calculation (min): 60 min    Short Term Goals: Week 3:  OT Short Term Goal 1 (Week 3): STGs=LTGs due to ELOS  Skilled Therapeutic Interventions/Progress Updates:     Upon entering the room, pt supine in bed with c/o pain in L LE. Pt received pain medication during this session. Pt discussed home set up with therapist that she now has 1st floor apartment but is unable to move into until 4 weeks after discharge. This set up will clearly not work at discharge. Pt as also been refusing bowel program and OT provided education of importance of following through with bowel program. OT donning B ACE wraps on B LEs. Pt rolling L <> R with max A for LB clothing management. Total A to don pants from bed level. Min A to pull shirt down trunk this session. Pt remained in bed at end of session and eating breakfast. Call bell and all needed items within reach upon exiting the room.   Therapy Documentation Precautions:  Precautions Precautions: Back, Fall Precaution Comments: obese. No brace needed Restrictions Weight Bearing Restrictions: No   Therapy/Group: Individual Therapy  [name] 02/18/2019, 8:54 AM

## 2019-02-18 NOTE — Progress Notes (Signed)
Patient does not want to have enema at 0900, states that it interferes with her therapy. Enema will now be given at 0600 daily to avoid conflict with therapy.  Doy Hutching, LPN

## 2019-02-18 NOTE — Progress Notes (Signed)
Physical Therapy Session Note  Patient Details  Name: Ellen Hill MRN: [id number] Date of Birth: 1971-04-05  Today's Date: 02/18/2019 PT Individual Time: 0900-1015 PT Individual Time Calculation (min): 75 min   Short Term Goals: Week 3:  PT Short Term Goal 1 (Week 3): Pt will complete rolling in bed with mod A and use of bedrails consistently PT Short Term Goal 2 (Week 3): Pt will propel manual w/c x 150 ft with use of BUE and Supervision PT Short Term Goal 3 (Week 3): Pt will maintain sitting balance EOM with CGA consistently  Skilled Therapeutic Interventions/Progress Updates:    Pt received seated in bed, agreeable to PT session. No complaints of pain. Rolling L/R with max A and use of bedrails for sling placement. Maxisky transfer bed to w/c. Dependent transport via w/c to therapy gym for time conservation. Maxisky transfer w/c to mat table. Lateral lean to the L with mod A with assist from 2nd person to lift RLE and place slide board. Pt is able to complete slide board transfer to the R from mat table to w/c with 4" block under BLE for support, one therapist in front of patient to block knees and adjust feet during transfer, one therapist behind patient to prevent posterior leaning, min A provided. Pt needs increased time and encouragement to complete transfer due to anxiety. Slide board transfer w/c to mat table to the L with min to mod A x 3: one therapist in front of patient, one behind pt and one to the L of patient. Pt needs increased assist with this transfer due to not having posterior support in sitting from the w/c and has increase in posterior lean with onset of fatigue as well as increase in anxiety and amount of encouragement needed to complete transfer. Maxisky transfer back to w/c. Pt left seated in w/c in room with needs in reach at end of therapy session.  Therapy Documentation Precautions:  Precautions Precautions: Back, Fall Precaution Comments: obese. No brace  needed Restrictions Weight Bearing Restrictions: No   Therapy/Group: Individual Therapy   Excell Seltzer, PT, DPT  02/18/2019, 4:31 PM

## 2019-02-19 ENCOUNTER — Inpatient Hospital Stay (HOSPITAL_COMMUNITY): Payer: Self-pay | Admitting: Occupational Therapy

## 2019-02-19 ENCOUNTER — Inpatient Hospital Stay (HOSPITAL_COMMUNITY): Payer: Self-pay | Admitting: Physical Therapy

## 2019-02-19 NOTE — Progress Notes (Signed)
Physical Therapy Session Note  Patient Details  Name: Canyon Willow MRN: [id number] Date of Birth: 1971-05-30  Today's Date: 02/19/2019 PT Individual Time: 1415-1515 PT Individual Time Calculation (min): 60 min   Short Term Goals: Week 3:  PT Short Term Goal 1 (Week 3): Pt will complete rolling in bed with mod A and use of bedrails consistently PT Short Term Goal 2 (Week 3): Pt will propel manual w/c x 150 ft with use of BUE and Supervision PT Short Term Goal 3 (Week 3): Pt will maintain sitting balance EOM with CGA consistently  Skilled Therapeutic Interventions/Progress Updates:   Pt received supine in bed and agreeable to PT. Maxi sky transfer to [facility]. Transported to rehab gym in [facility]. Maxi sky transfer to mat table. Attempted scooting EOB. Max cues for anterior weight shift, UE Placement, improved use of BLE as able. Pt unable to perform any lateral movement R or L through 10 attempts.   Forward weight shifting on therapist shoulders to provide resistance and force use of deep core. 2 x 10 with prolonged rest break between bouts. Maxi sky transfer to [facility] and then to bed. Pt left in bed with call bell in reach and all needs met.      Therapy Documentation Precautions:  Precautions Precautions: Back, Fall Precaution Comments: obese. No brace needed Restrictions Weight Bearing Restrictions: No Vital Signs: Therapy Vitals Temp: 98.5 F (36.9 C) Temp Source: Oral Pulse Rate: 96 Resp: 16 BP: 118/82 Patient Position (if appropriate): Lying Oxygen Therapy SpO2: 96 % O2 Device: Room Air Pain: denies   Therapy/Group: Individual Therapy  Lorie Phenix 02/19/2019, 5:38 PM

## 2019-02-19 NOTE — Progress Notes (Signed)
[facility] PHYSICAL MEDICINE & REHABILITATION PROGRESS NOTE  Subjective/Complaints:  No new issues, BM this am after bowel program at 530A  ROS: Patient denies cp, SOB, N/V/D     Objective: Vital Signs: Blood pressure 115/70, pulse 96, temperature 98.2 F (36.8 C), resp. rate 18, height 5\' 6"  (1.676 m), SpO2 100 %. No results found. Recent Labs    02/16/19 0946  WBC 9.3  HGB 10.7*  HCT 35.8*  PLT 280   No results for input(s): NA, K, CL, CO2, GLUCOSE, BUN, CREATININE, CALCIUM in the last 72 hours.  Physical Exam: BP 115/70 (BP Location: Right Arm)   Pulse 96   Temp 98.2 F (36.8 C)   Resp 18   Ht 5\' 6"  (1.676 m)   SpO2 100%   BMI 64.76 kg/m  Constitutional: No distress . Vital signs reviewed. HEENT: EOMI, oral membranes moist Neck: supple Cardiovascular: RRR without murmur. No JVD    Respiratory: CTA Bilaterally without wheezes or rales. Normal effort    GI: BS +, non-tender, non-distended  Musc: mild tenderness left knee  Neurological: She isalert and oriented. Motor: Bilateral UE 5/5.  RLE HF, KE 2/5, 3/5 ADF.  LLE: Trace toe flexion and extension left foot--no change today Senses pain and gross touch in both legs--no change today Skin: Skin iswarm.  Back incision not visualized Psychiatric: in good spirits  Assessment/Plan: 1. 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  Physiatrist is providing close team supervision and 24 hour management of active medical problems listed below.  Physiatrist and rehab team continue to assess barriers to discharge/monitor patient progress toward functional and medical goals  Care Tool:  Bathing    Body parts bathed by patient: Right arm, Left arm, Chest, Abdomen, Face   Body parts bathed by helper: Front perineal area, Buttocks, Right upper leg, Left upper leg, Right lower leg,  Left lower leg     Bathing assist Assist Level: 2 Helpers     Upper Body Dressing/Undressing Upper body dressing   What is the patient wearing?: Pull over shirt    Upper body assist Assist Level: Minimal Assistance - Patient > 75%    Lower Body Dressing/Undressing Lower body dressing      What is the patient wearing?: Pants     Lower body assist Assist for lower body dressing: Total Assistance - Patient < 25%     Toileting Toileting    Toileting assist Assist for toileting: 2 Helpers     Transfers Chair/bed transfer  Transfers assist     Chair/bed transfer assist level: 2 Helpers     Locomotion Ambulation   Ambulation assist   Ambulation activity did not occur: Safety/medical concerns          Walk 10 feet activity   Assist  Walk 10 feet activity did not occur: Safety/medical concerns        Walk 50 feet activity   Assist Walk 50 feet with 2 turns activity did not occur: Safety/medical concerns         Walk 150 feet activity   Assist Walk 150 feet activity did not occur: Safety/medical concerns         Walk 10 feet on uneven surface  activity   Assist Walk 10 feet on uneven surfaces activity did not occur: Safety/medical concerns         Wheelchair     Assist Will  patient use wheelchair at discharge?: Yes Type of Wheelchair: Manual Wheelchair activity did not occur: Safety/medical concerns  Wheelchair assist level: Dependent - Patient 0% Max wheelchair distance: 100'    Wheelchair 50 feet with 2 turns activity    Assist    Wheelchair 50 feet with 2 turns activity did not occur: Safety/medical concerns   Assist Level: Dependent - Patient 0%   Wheelchair 150 feet activity     Assist Wheelchair 150 feet activity did not occur: Safety/medical concerns          Medical Problem List and Plan: 1.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 No back brace required  --Continue CIR therapies including PT, OT     still using maxi move Will need 2 person assist at home -PRAFO LLE at HS 2. DVT Prophylaxis/Anticoagulation: SCDs.   Vascular study limited, but negative 3. Pain Management:Neurontin 300 mg 3 times a day---increased to 400mg  tid with benefit  - hydrocodone as needed  -prn baclofen  Patellofemoral pain: voltaren gel helpful 4. Mood:Provide emotional support 5. Neuropsych: This patientiscapable of making decisions on herown behalf. 6. Skin/Wound Care:Routine skin checks 7. Fluids/Electrolytes/Nutrition:Routine ins and outs  Push PO 8.Proteus/Escherichia coli UTI. Recurrent Neurogenic bladder Bactrim completed 2/12, recurrent symptoms of dysuria, + UA, Cx proteus based on  C and S  Cont Keflex 9.  Super obesity. Latest BMI 64.79. Dietary follow-up  -discussed appropriate intake to help with healing and weight control  -she says the only things she can eat are chips and pickles. Family does bring in some things from outside. 10. Neurogenic bowel and bladder. Pt with GI discomfort today of unclear etiology  Flomax started on 2/8, peri-care ongoing   -continue senna-s at night  -hold miralax for now due to upset, soft stools  -daily dulcolax 12.?  Anemia: hgb down to 9.9  2/20  -repeat CBC, improved Hgb to 10.7 13. Insomnia: scheduled trazodone      LOS: 21 days A FACE TO FACE EVALUATION WAS PERFORMED  Charlett Blake 02/19/2019, 8:25 AM

## 2019-02-19 NOTE — Progress Notes (Signed)
Occupational Therapy Session Note  Patient Details  Name: Ellen Hill MRN: [id number] Date of Birth: [birth year]  Today's Date: 02/19/2019 OT Individual Time: 4098-1191 OT Individual Time Calculation (min): 73 min   Short Term Goals: Week 1:  OT Short Term Goal 1 (Week 1): Pt will tolerate sitting EOB/mat for 3 minutes for self care tasks.  OT Short Term Goal 1 - Progress (Week 1): Met OT Short Term Goal 2 (Week 1): Pt will perform UB self care with min A overall in order to reduce assistance with functional tasks.  OT Short Term Goal 2 - Progress (Week 1): Met OT Short Term Goal 3 (Week 1): Pt will demonstrate B UE strengthening HEP with supervision and use of paper handout as needed. OT Short Term Goal 3 - Progress (Week 1): Met  Skilled Therapeutic Interventions/Progress Updates:    Pt greeted in bed, premedicated for pain and bowel program already completed this AM. B LEs ACE wrapped with 2 assist. While donning pants, had pt use leg lifter and reacher to increase functional independence. In the middle of AE training, pt reported urgent need to void bladder. Pt with continent void using urinal. She was able to complete frontal perihygiene with assist for LE mgt. 2 assist for thorough perihygiene, brief change, and donning pants with pt rolling Rt>Lt. Dependent for w/c transfer using Maxi sky. While sitting, pt completed hand washing, lotion application, and oral care w/c level at sink with close supervision for anterior weight shifting towards sink! Pt left in w/c with all needs within reach at session exit. Tx focus today placed on functional bed mobility/UB strengthening, ADL retraining, and trunk strengthening.    Therapy Documentation Precautions:  Precautions Precautions: Back, Fall Precaution Comments: obese. No brace needed Restrictions Weight Bearing Restrictions: No Vital Signs: Therapy Vitals Temp: 98.5 F (36.9 C) Temp Source: Oral Pulse Rate: 96 Resp: 16 BP:  118/82 Patient Position (if appropriate): Lying Oxygen Therapy SpO2: 96 % O2 Device: Room Air Pain: Per pt, premedicated for pain    ADL:     Therapy/Group: Individual Therapy  Ellen Hill 02/19/2019, 4:25 PM

## 2019-02-19 NOTE — Progress Notes (Signed)
Physical Therapy Session Note  Patient Details  Name: Ellen Hill MRN: [id number] Date of Birth: 14-Mar-1971  Today's Date: 02/19/2019 PT Individual Time: 1100-1200 PT Individual Time Calculation (min): 60 min   Short Term Goals: Week 3:  PT Short Term Goal 1 (Week 3): Pt will complete rolling in bed with mod A and use of bedrails consistently PT Short Term Goal 2 (Week 3): Pt will propel manual w/c x 150 ft with use of BUE and Supervision PT Short Term Goal 3 (Week 3): Pt will maintain sitting balance EOM with CGA consistently  Skilled Therapeutic Interventions/Progress Updates:    Pt received seated in w/c in room, agreeable to participate in therapy session. Pt reports no pain but has some nausea following bowel program this AM. Pt also feeling more depressed this date regarding questionable d/c plan. Pt requires max encouragement to agree to attempt slide board transfer this session. Maxisky transfer w/c to mat table. Slide board transfer mat table to w/c with min A x 2: one therapist in front to help reposition LE, one behind to prevent posterior leaning, 4" step under BLE. Pt needs increased time and encouragement to complete transfer this date. Pt is able to reposition herself in w/c with BLE supported on 4" step, min A and cueing for sequencing and weight shift. Pt left seated in w/c in room with needs in reach at end of therapy session.  Therapy Documentation Precautions:  Precautions Precautions: Back, Fall Precaution Comments: obese. No brace needed Restrictions Weight Bearing Restrictions: No    Therapy/Group: Individual Therapy   Excell Seltzer, PT, DPT  02/19/2019, 4:11 PM

## 2019-02-20 ENCOUNTER — Inpatient Hospital Stay (HOSPITAL_COMMUNITY): Payer: Self-pay | Admitting: Occupational Therapy

## 2019-02-20 NOTE — Plan of Care (Signed)
  Problem: Consults Goal: RH SPINAL CORD INJURY PATIENT EDUCATION Description  See Patient Education module for education specifics.  Outcome: Progressing Goal: Skin Care Protocol Initiated - if Braden Score 18 or less Description If consults are not indicated, leave blank or document N/A Outcome: Progressing   Problem: SCI BOWEL ELIMINATION Goal: RH STG MANAGE BOWEL WITH ASSISTANCE Description STG Manage Bowel with moderate x2 Assistance.  Outcome: Progressing Goal: RH STG SCI MANAGE BOWEL WITH MEDICATION WITH ASSISTANCE Description STG SCI Manage bowel with medication with min to mod assistance.  Outcome: Progressing Goal: RH STG SCI MANAGE BOWEL PROGRAM W/ASSIST OR AS APPROPRIATE Description STG SCI Manage bowel program with min to mod assist or as appropriate.  Outcome: Progressing   Problem: SCI BLADDER ELIMINATION Goal: RH STG MANAGE BLADDER WITH ASSISTANCE Description STG Manage Bladder With maximum Assistance  Outcome: Progressing   Problem: RH SAFETY Goal: RH STG ADHERE TO SAFETY PRECAUTIONS W/ASSISTANCE/DEVICE Description STG Adhere to Safety Precautions With mod to max Assistance and with appropriate safety or assistive Device.  Outcome: Progressing   Problem: RH PAIN MANAGEMENT Goal: RH STG PAIN MANAGED AT OR BELOW PT'S PAIN GOAL Description <3 on a 1-10 pain scale  Outcome: Progressing   Problem: RH KNOWLEDGE DEFICIT SCI Goal: RH STG INCREASE KNOWLEDGE OF SELF CARE AFTER SCI Description Patient will be able to demonstrate how to manage medications, diet, bowel and bladder with min assistance from rehab staff before discharge.  Outcome: Progressing

## 2019-02-20 NOTE — Progress Notes (Signed)
Occupational Therapy Session Note  Patient Details  Name: Ellen Hill MRN: [id number] Date of Birth: December 22, 1971  Today's Date: 02/20/2019 OT Individual Time: 1135-1206 OT Individual Time Calculation (min): 31 min    Short Term Goals: Week 3:  OT Short Term Goal 1 (Week 3): STGs=LTGs due to ELOS    Skilled Therapeutic Interventions/Progress Updates:    Pt greeted in bed, no c/o pain. Just fatigue. Requesting to complete bedlevel tx. Provided pt with a bedlevel HEP focusing on trunk and UE strengthening using bedrails and theraband. She completed B UE therapeutic exercise with green band x10-15 reps 2 sets with instruction. Also had pt complete mini modified crunches with B UE support on bedrails. Educated both pt and sister to complete exercises as often as possible throughout the day. Also educated both of them on pressure relief in bed to prevent skin breakdown. Pt verbalized she frequently calls RN staff to assist with repositioning in bed. At end of session pt was left with sister and all needs.   Therapy Documentation Precautions:  Precautions Precautions: Back, Fall Precaution Comments: obese. No brace needed Restrictions Weight Bearing Restrictions: No Pain Assessment Faces Pain Scale: Hurts a little bit ADL:       Therapy/Group: Individual Therapy  Ellen Hill 02/20/2019, 12:41 PM

## 2019-02-20 NOTE — Progress Notes (Signed)
[place] PHYSICAL MEDICINE & REHABILITATION PROGRESS NOTE  Subjective/Complaints:  Having BM after am bowel program  ROS: Patient denies cp, SOB, N/V/D     Objective: Vital Signs: Blood pressure 127/85, pulse 100, temperature (!) 97.5 F (36.4 C), temperature source Oral, resp. rate 18, height 5\' 6"  (1.676 m), SpO2 100 %. No results found. No results for input(s): WBC, HGB, HCT, PLT in the last 72 hours. No results for input(s): NA, K, CL, CO2, GLUCOSE, BUN, CREATININE, CALCIUM in the last 72 hours.  Physical Exam: BP 127/85 (BP Location: Right Arm)   Pulse 100   Temp (!) 97.5 F (36.4 C) (Oral)   Resp 18   Ht 5\' 6"  (1.676 m)   SpO2 100%   BMI 64.76 kg/m  Constitutional: No distress . Vital signs reviewed. HEENT: EOMI, oral membranes moist Neck: supple Cardiovascular: RRR without murmur. No JVD    Respiratory: CTA Bilaterally without wheezes or rales. Normal effort    GI: BS +, non-tender, non-distended  Musc: mild tenderness left knee  Neurological: She isalert and oriented. Motor: Bilateral UE 5/5.  RLE HF, KE 2/5, 3/5 ADF.  LLE: Trace toe flexion and extension left foot--no change today Senses pain and gross touch in both legs--no change today Skin: Skin iswarm.  Back incision not visualized Psychiatric: in good spirits  Assessment/Plan: 1. 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  Physiatrist is providing close team supervision and 24 hour management of active medical problems listed below.  Physiatrist and rehab team continue to assess barriers to discharge/monitor patient progress toward functional and medical goals  Care Tool:  Bathing    Body parts bathed by patient: Right arm, Left arm, Chest, Abdomen, Face   Body parts bathed by helper: Front perineal area, Buttocks, Right upper leg, Left upper leg, Right  lower leg, Left lower leg     Bathing assist Assist Level: 2 Helpers     Upper Body Dressing/Undressing Upper body dressing   What is the patient wearing?: Pull over shirt    Upper body assist Assist Level: Minimal Assistance - Patient > 75%    Lower Body Dressing/Undressing Lower body dressing      What is the patient wearing?: Pants     Lower body assist Assist for lower body dressing: 2 Helpers     Toileting Toileting    Toileting assist Assist for toileting: 2 Helpers     Transfers Chair/bed transfer  Transfers assist     Chair/bed transfer assist level: 2 Helpers     Locomotion Ambulation   Ambulation assist   Ambulation activity did not occur: Safety/medical concerns          Walk 10 feet activity   Assist  Walk 10 feet activity did not occur: Safety/medical concerns        Walk 50 feet activity   Assist Walk 50 feet with 2 turns activity did not occur: Safety/medical concerns         Walk 150 feet activity   Assist Walk 150 feet activity did not occur: Safety/medical concerns         Walk 10 feet on uneven surface  activity   Assist Walk 10 feet on uneven surfaces activity did not occur: Safety/medical concerns         Wheelchair     Assist Will patient use wheelchair at discharge?: Yes Type of Wheelchair: Educational psychologist  activity did not occur: Safety/medical concerns  Wheelchair assist level: Dependent - Patient 0% Max wheelchair distance: 100'    Wheelchair 50 feet with 2 turns activity    Assist    Wheelchair 50 feet with 2 turns activity did not occur: Safety/medical concerns   Assist Level: Dependent - Patient 0%   Wheelchair 150 feet activity     Assist Wheelchair 150 feet activity did not occur: Safety/medical concerns          Medical Problem List and Plan: 1.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 No back brace required  --Continue CIR therapies including PT, OT     Little improvement with motor strength in LEs PRAFO LLE at HS 2. DVT Prophylaxis/Anticoagulation: SCDs.   Vascular study limited, but negative 3. Pain Management:Neurontin 300 mg 3 times a day---increased to 400mg  tid with benefit  - hydrocodone as needed  -prn baclofen  Patellofemoral pain: voltaren gel helpful 4. Mood:Provide emotional support 5. Neuropsych: This patientiscapable of making decisions on herown behalf. 6. Skin/Wound Care:Routine skin checks 7. Fluids/Electrolytes/Nutrition:Routine ins and outs  Push PO 8.Proteus/Escherichia coli UTI. Recurrent Neurogenic bladder Bactrim completed 2/12, recurrent symptoms of dysuria, + UA, Cx proteus based on  C and S  Cont Keflex 9.  Super obesity. Latest BMI 64.79. Dietary follow-up  -discussed appropriate intake to help with healing and weight control  -she says the only things she can eat are chips and pickles. Family does bring in some things from outside. 10. Neurogenic bowel and bladder. Pt with GI discomfort today of unclear etiology  Flomax started on 2/8, peri-care ongoing   -continue senna-s at night  -hold miralax for now due to upset, soft stools  -daily fleets, some cramping, but effective 12.?  Anemia: hgb down to 9.9  2/20  -repeat CBC, improved Hgb to 10.7 13. Insomnia: scheduled trazodone      LOS: 22 days A FACE TO FACE EVALUATION WAS PERFORMED  Charlett Blake 02/20/2019, 11:24 AM

## 2019-02-21 ENCOUNTER — Inpatient Hospital Stay (HOSPITAL_COMMUNITY): Payer: Self-pay | Admitting: Occupational Therapy

## 2019-02-21 DIAGNOSIS — G8220 Paraplegia, unspecified: Secondary | ICD-10-CM | POA: Diagnosis present

## 2019-02-21 DIAGNOSIS — Z79899 Other long term (current) drug therapy: Secondary | ICD-10-CM | POA: Diagnosis not present

## 2019-02-21 DIAGNOSIS — E669 Obesity, unspecified: Secondary | ICD-10-CM | POA: Diagnosis not present

## 2019-02-21 DIAGNOSIS — K592 Neurogenic bowel, not elsewhere classified: Secondary | ICD-10-CM | POA: Diagnosis present

## 2019-02-21 DIAGNOSIS — Z87891 Personal history of nicotine dependence: Secondary | ICD-10-CM | POA: Diagnosis not present

## 2019-02-21 DIAGNOSIS — F4321 Adjustment disorder with depressed mood: Secondary | ICD-10-CM | POA: Diagnosis present

## 2019-02-21 DIAGNOSIS — B9620 Unspecified Escherichia coli [E. coli] as the cause of diseases classified elsewhere: Secondary | ICD-10-CM | POA: Diagnosis present

## 2019-02-21 DIAGNOSIS — R300 Dysuria: Secondary | ICD-10-CM | POA: Diagnosis not present

## 2019-02-21 DIAGNOSIS — G4700 Insomnia, unspecified: Secondary | ICD-10-CM | POA: Diagnosis present

## 2019-02-21 DIAGNOSIS — J45909 Unspecified asthma, uncomplicated: Secondary | ICD-10-CM | POA: Diagnosis present

## 2019-02-21 DIAGNOSIS — Z4789 Encounter for other orthopedic aftercare: Secondary | ICD-10-CM | POA: Diagnosis present

## 2019-02-21 DIAGNOSIS — N390 Urinary tract infection, site not specified: Secondary | ICD-10-CM | POA: Diagnosis present

## 2019-02-21 DIAGNOSIS — N319 Neuromuscular dysfunction of bladder, unspecified: Secondary | ICD-10-CM | POA: Diagnosis present

## 2019-02-21 DIAGNOSIS — B964 Proteus (mirabilis) (morganii) as the cause of diseases classified elsewhere: Secondary | ICD-10-CM | POA: Diagnosis present

## 2019-02-21 DIAGNOSIS — R159 Full incontinence of feces: Secondary | ICD-10-CM | POA: Diagnosis not present

## 2019-02-21 DIAGNOSIS — F419 Anxiety disorder, unspecified: Secondary | ICD-10-CM | POA: Diagnosis present

## 2019-02-21 DIAGNOSIS — D638 Anemia in other chronic diseases classified elsewhere: Secondary | ICD-10-CM | POA: Diagnosis present

## 2019-02-21 DIAGNOSIS — G959 Disease of spinal cord, unspecified: Secondary | ICD-10-CM | POA: Diagnosis not present

## 2019-02-21 DIAGNOSIS — K5900 Constipation, unspecified: Secondary | ICD-10-CM | POA: Diagnosis present

## 2019-02-21 DIAGNOSIS — Z6841 Body Mass Index (BMI) 40.0 and over, adult: Secondary | ICD-10-CM | POA: Diagnosis not present

## 2019-02-21 NOTE — Progress Notes (Signed)
[name] PHYSICAL MEDICINE & REHABILITATION PROGRESS NOTE  Subjective/Complaints:  Still having problem moving legs, refused bowel program this morning she is feels like the enema makes her feel nauseated all day afterwards.  We discussed that this is unlikely to be related to her enema.  She states that she will try it again tomorrow  ROS: Patient denies cp, SOB, N/V/D     Objective: Vital Signs: Blood pressure 120/78, pulse 95, temperature 98.6 F (37 C), resp. rate 14, height 5\' 6"  (1.676 m), SpO2 100 %. No results found. No results for input(s): WBC, HGB, HCT, PLT in the last 72 hours. No results for input(s): NA, K, CL, CO2, GLUCOSE, BUN, CREATININE, CALCIUM in the last 72 hours.  Physical Exam: BP 120/78 (BP Location: Right Wrist)   Pulse 95   Temp 98.6 F (37 C)   Resp 14   Ht 5\' 6"  (1.676 m)   SpO2 100%   BMI 64.76 kg/m  Constitutional: No distress . Vital signs reviewed. HEENT: EOMI, oral membranes moist Neck: supple Cardiovascular: RRR without murmur. No JVD    Respiratory: CTA Bilaterally without wheezes or rales. Normal effort    GI: BS +, non-tender, non-distended  Musc: mild tenderness left knee  Neurological: She isalert and oriented. Motor: Bilateral UE 5/5.  RLE HF, KE 2/5, 3/5 ADF.  LLE: Trace toe flexion and extension left foot--no change today Senses pain and gross touch in both legs--no change today Skin: Skin iswarm.  Back incision not visualized Psychiatric: in good spirits  Assessment/Plan: 1. 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  Physiatrist is providing close team supervision and 24 hour management of active medical problems listed below.  Physiatrist and rehab team continue to assess barriers to discharge/monitor patient progress toward functional and medical goals  Care Tool:  Bathing     Body parts bathed by patient: Right arm, Left arm, Chest, Abdomen, Face   Body parts bathed by helper: Front perineal area, Buttocks, Right upper leg, Left upper leg, Right lower leg, Left lower leg     Bathing assist Assist Level: 2 Helpers     Upper Body Dressing/Undressing Upper body dressing   What is the patient wearing?: Pull over shirt    Upper body assist Assist Level: Minimal Assistance - Patient > 75%    Lower Body Dressing/Undressing Lower body dressing      What is the patient wearing?: Pants     Lower body assist Assist for lower body dressing: 2 Helpers     Toileting Toileting    Toileting assist Assist for toileting: 2 Helpers     Transfers Chair/bed transfer  Transfers assist     Chair/bed transfer assist level: 2 Helpers     Locomotion Ambulation   Ambulation assist   Ambulation activity did not occur: Safety/medical concerns          Walk 10 feet activity   Assist  Walk 10 feet activity did not occur: Safety/medical concerns        Walk 50 feet activity   Assist Walk 50 feet with 2 turns activity did not occur: Safety/medical concerns         Walk 150 feet activity   Assist Walk 150 feet activity did not occur: Safety/medical concerns         Walk 10 feet on uneven surface  activity   Assist Walk 10 feet on  uneven surfaces activity did not occur: Safety/medical concerns         Wheelchair     Assist Will patient use wheelchair at discharge?: Yes Type of Wheelchair: Manual Wheelchair activity did not occur: Safety/medical concerns  Wheelchair assist level: Dependent - Patient 0% Max wheelchair distance: 100'    Wheelchair 50 feet with 2 turns activity    Assist    Wheelchair 50 feet with 2 turns activity did not occur: Safety/medical concerns   Assist Level: Dependent - Patient 0%   Wheelchair 150 feet activity     Assist Wheelchair 150 feet activity did not occur: Safety/medical  concerns          Medical Problem List and Plan: 1.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 No back brace required  --Continue CIR therapies including PT, OT     Little improvement with motor strength in LEs, still requiring Maxi sky lift PRAFO LLE at HS 2. DVT Prophylaxis/Anticoagulation: SCDs.   Vascular study limited, but negative 3. Pain Management:Neurontin 300 mg 3 times a day---increased to 400mg  tid with benefit  - hydrocodone as needed  -prn baclofen  Patellofemoral pain: voltaren gel helpful 4. Mood:Provide emotional support 5. Neuropsych: This patientiscapable of making decisions on herown behalf. 6. Skin/Wound Care:Routine skin checks 7. Fluids/Electrolytes/Nutrition:Routine ins and outs  Push PO 8.Proteus/Escherichia coli UTI. Recurrent Neurogenic bladder Bactrim completed 2/12, recurrent symptoms of dysuria, + UA, Cx proteus based on  C and S  Cont Keflex, should finish treatment on 02/24/2019 9.  Super obesity. Latest BMI 64.79. Dietary follow-up  -discussed appropriate intake to help with healing and weight control  -she says the only things she can eat are chips and pickles. Family does bring in some things from outside. 10. Neurogenic bowel and bladder. Pt with GI discomfort today of unclear etiology  Flomax started on 2/8, peri-care ongoing   -continue senna-s at night  -hold miralax for now due to upset, soft stools  -daily fleets, some cramping, but effective, patient refused today.  She states she will try again tomorrow. 12.?  Anemia: hgb down to 9.9  2/20  -repeat CBC, improved Hgb to 10.7 13. Insomnia: scheduled trazodone      LOS: 23 days A FACE TO FACE EVALUATION WAS PERFORMED  Ellen Hill 02/21/2019, 11:05 AM

## 2019-02-21 NOTE — Progress Notes (Signed)
Occupational Therapy Session Note  Patient Details  Name: Ellen Hill MRN: [id number] Date of Birth: [birth year]  Today's Date: 02/21/2019 OT Individual Time: 9357-0177 OT Individual Time Calculation (min): 58 min   Short Term Goals: Week 3:  OT Short Term Goal 1 (Week 3): STGs=LTGs due to ELOS  Skilled Therapeutic Interventions/Progress Updates:    Pt greeted in bed, premedicated for pain. Started with ACE wrapping B LEs with Total A. Pt rolled towards Lt with 2 assist to check if brief was clean, and then proceeded with donning pants. 2 assist for rolling Rt>Lt to elevate pants over hips with pt able to help with elevating pants in the front. Min A for donning shirt bedlevel and setup for deodorant application. Maxi transfer<w/c completed with Total A. Pt self propelled w/c to sink to complete oral care, handwashing and other grooming tasks with vcs for anterior weight shifting to work on core strength. Pt still with heavy UE reliance for anterior weight shifts and maintaining unsupported sitting. Increased time and rest breaks required due to fatigue. Reciprocal scooting back in w/c completed with Mod A of 1. Pt was then left in w/c with all needs within reach. Tx focus today placed on UB strengthening and activity tolerance.    Therapy Documentation Precautions:  Precautions Precautions: Back, Fall Precaution Comments: obese. No brace needed Restrictions Weight Bearing Restrictions: No Vital Signs: Therapy Vitals Temp: (!) 97.5 F (36.4 C) Pulse Rate: (!) 105 Resp: 18 BP: (!) 115/94 Patient Position (if appropriate): Sitting Oxygen Therapy SpO2: 99 % O2 Device: Room Air Pain: L LE Pain Assessment Pain Scale: 0-10 Pain Score: 6  Faces Pain Scale: Hurts a little bit Pain Type: Acute pain Pain Location: Abdomen Pain Orientation: Left;Lateral;Upper Pain Descriptors / Indicators: Cramping Pain Frequency: Intermittent Pain Onset: On-going Pain Intervention(s): Medication  (See eMAR)(norco) ADL:       Therapy/Group: Individual Therapy  Leocadia Idleman A Mellisa Arshad 02/21/2019, 4:02 PM

## 2019-02-21 NOTE — Plan of Care (Signed)
  Problem: Consults Goal: RH SPINAL CORD INJURY PATIENT EDUCATION Description  See Patient Education module for education specifics.  Outcome: Progressing Goal: Skin Care Protocol Initiated - if Braden Score 18 or less Description If consults are not indicated, leave blank or document N/A Outcome: Progressing   Problem: SCI BOWEL ELIMINATION Goal: RH STG MANAGE BOWEL WITH ASSISTANCE Description STG Manage Bowel with moderate x2 Assistance.  Outcome: Progressing Goal: RH STG SCI MANAGE BOWEL WITH MEDICATION WITH ASSISTANCE Description STG SCI Manage bowel with medication with min to mod assistance.  Outcome: Progressing Goal: RH STG SCI MANAGE BOWEL PROGRAM W/ASSIST OR AS APPROPRIATE Description STG SCI Manage bowel program with min to mod assist or as appropriate.  Outcome: Progressing   Problem: SCI BLADDER ELIMINATION Goal: RH STG MANAGE BLADDER WITH ASSISTANCE Description STG Manage Bladder With maximum Assistance  Outcome: Progressing   Problem: RH SAFETY Goal: RH STG ADHERE TO SAFETY PRECAUTIONS W/ASSISTANCE/DEVICE Description STG Adhere to Safety Precautions With mod to max Assistance and with appropriate safety or assistive Device.  Outcome: Progressing   Problem: RH PAIN MANAGEMENT Goal: RH STG PAIN MANAGED AT OR BELOW PT'S PAIN GOAL Description <3 on a 1-10 pain scale  Outcome: Progressing   Problem: RH KNOWLEDGE DEFICIT SCI Goal: RH STG INCREASE KNOWLEDGE OF SELF CARE AFTER SCI Description Patient will be able to demonstrate how to manage medications, diet, bowel and bladder with min assistance from rehab staff before discharge.  Outcome: Progressing

## 2019-02-21 NOTE — Progress Notes (Signed)
Slept good. 2 PRN vicodin given at 2108, for C/O BLE pain, left > right. Refusing enema in AM. Patient reports, enema gives her abdominal cramps, that last all day. Ellen Hill A

## 2019-02-22 ENCOUNTER — Inpatient Hospital Stay (HOSPITAL_COMMUNITY): Payer: Self-pay | Admitting: Occupational Therapy

## 2019-02-22 ENCOUNTER — Inpatient Hospital Stay (HOSPITAL_COMMUNITY): Payer: Self-pay | Admitting: Physical Therapy

## 2019-02-22 DIAGNOSIS — A499 Bacterial infection, unspecified: Secondary | ICD-10-CM

## 2019-02-22 NOTE — Progress Notes (Signed)
Physical Therapy Session Note  Patient Details  Name: Ellen Hill MRN: [id number] Date of Birth: [birth year]  Today's Date: 02/22/2019 PT Individual Time: 4650-3546 PT Individual Time Calculation (min): 75 min   Short Term Goals: Week 3:  PT Short Term Goal 1 (Week 3): Pt will complete rolling in bed with mod A and use of bedrails consistently PT Short Term Goal 2 (Week 3): Pt will propel manual w/c x 150 ft with use of BUE and Supervision PT Short Term Goal 3 (Week 3): Pt will maintain sitting balance EOM with CGA consistently  Skilled Therapeutic Interventions/Progress Updates:    Pt received seated in bed, agreeable to PT session. No complaints of pain. Rolling L/R with max A and use of bedrails for dependent donning of BLE ACE wrap, pants, shoes, and for placement of maxisky sling. Maxisky transfer bed to w/c with assist x 2. Dependent transport via manual w/c to therapy gym for time conservations. Luz Brazen, ATP present for patient w/c evaluation. Pt is able to propel manual w/c up to 150 ft with use of BUE at Supervision level but does not demonstrate optimal UB biomechanics due to body habitus and wheel placement on w/c. Pt also requires significant energy expenditure in order to propel manual w/c due to body habitus as well as added weight of chair across hard, level ground. It is not practical to expect that pt will be able to maneuver a manual w/c across carpeted surfaces in her home and through doorways, around corners, etc independently. Pt would benefit from use of a power w/c for increased independence with functional mobility. Pt is also not able to perform adequate pressure relief while seated in manual w/c as she is unable to get any clearance when pushing with BUE through armrests to lift up her body due to body habitus and weight. Pt would benefit from tilting feature present in a power w/c for improved pressure relief and prevention of skin breakdown. Re-assessed pt's BLE  strength: RLE 2-3/5, LLE 1/5. Pt exhibits active musculature in LLE though not at a strength that is functional. Pt reports improvement in LLE sensation with decrease in hypersensitivity. Pt left seated in w/c in room with needs in reach at end of therapy session.  Therapy Documentation Precautions:  Precautions Precautions: Back, Fall Precaution Comments: obese. No brace needed Restrictions Weight Bearing Restrictions: No    Therapy/Group: Individual Therapy   Excell Seltzer, PT, DPT  02/22/2019, 12:05 PM

## 2019-02-22 NOTE — Progress Notes (Signed)
[place] PHYSICAL MEDICINE & REHABILITATION PROGRESS NOTE  Subjective/Complaints:  Had results with Fleet enema today. Asked if we could do it every other day. Feels that she's made some gains with her mobility. Left leg pain is better  ROS: Patient denies fever, rash, sore throat, blurred vision, nausea, vomiting, diarrhea, cough, shortness of breath or chest pain,  headache, or mood change.   Objective: Vital Signs: Blood pressure 101/76, pulse 98, temperature 97.7 F (36.5 C), temperature source Oral, resp. rate 16, height 5\' 6"  (1.676 m), SpO2 92 %. No results found. No results for input(s): WBC, HGB, HCT, PLT in the last 72 hours. No results for input(s): NA, K, CL, CO2, GLUCOSE, BUN, CREATININE, CALCIUM in the last 72 hours.  Physical Exam: BP 101/76 (BP Location: Left Arm)   Pulse 98   Temp 97.7 F (36.5 C) (Oral)   Resp 16   Ht 5\' 6"  (1.676 m)   SpO2 92%   BMI 64.76 kg/m  Constitutional: No distress . Vital signs reviewed. Morbidly obese HEENT: EOMI, oral membranes moist Neck: supple Cardiovascular: RRR without murmur. No JVD    Respiratory: CTA Bilaterally without wheezes or rales. Normal effort    GI: BS +, non-tender, non-distended  Musc: mild tenderness left knee  Neurological: She isalert and oriented. Motor: Bilateral UE 5/5.  RLE HF, KE 2/5, 3/5 ADF---about the same LLE: Trace toe flexion and extension left foot--no canges Senses pain and gross touch in both legs--no change today Skin: Skin iswarm.  Back incision not visualized Psychiatric:flat   Assessment/Plan: 1. 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  Physiatrist is providing close team supervision and 24 hour management of active medical problems listed below.  Physiatrist and rehab team continue to assess barriers to discharge/monitor patient progress  toward functional and medical goals  Care Tool:  Bathing    Body parts bathed by patient: Right arm, Left arm, Chest, Abdomen, Face   Body parts bathed by helper: Front perineal area, Buttocks, Right upper leg, Left upper leg, Right lower leg, Left lower leg     Bathing assist Assist Level: 2 Helpers     Upper Body Dressing/Undressing Upper body dressing   What is the patient wearing?: Pull over shirt    Upper body assist Assist Level: Minimal Assistance - Patient > 75%    Lower Body Dressing/Undressing Lower body dressing      What is the patient wearing?: Pants     Lower body assist Assist for lower body dressing: 2 Helpers     Toileting Toileting    Toileting assist Assist for toileting: 2 Helpers     Transfers Chair/bed transfer  Transfers assist     Chair/bed transfer assist level: 2 Helpers     Locomotion Ambulation   Ambulation assist   Ambulation activity did not occur: Safety/medical concerns          Walk 10 feet activity   Assist  Walk 10 feet activity did not occur: Safety/medical concerns        Walk 50 feet activity   Assist Walk 50 feet with 2 turns activity did not occur: Safety/medical concerns         Walk 150 feet activity   Assist Walk 150 feet activity did not occur: Safety/medical concerns         Walk 10 feet on uneven surface  activity   Assist Walk  10 feet on uneven surfaces activity did not occur: Safety/medical concerns         Wheelchair     Assist Will patient use wheelchair at discharge?: Yes Type of Wheelchair: Manual Wheelchair activity did not occur: Safety/medical concerns  Wheelchair assist level: Dependent - Patient 0% Max wheelchair distance: 100'    Wheelchair 50 feet with 2 turns activity    Assist    Wheelchair 50 feet with 2 turns activity did not occur: Safety/medical concerns   Assist Level: Dependent - Patient 0%   Wheelchair 150 feet activity      Assist Wheelchair 150 feet activity did not occur: Safety/medical concerns          Medical Problem List and Plan: 1.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 No back brace required  --Continue CIR therapies including PT, OT     Little improvement with motor strength in LEs, still requiring Maxi sky lift PRAFO LLE at HS 2. DVT Prophylaxis/Anticoagulation: SCDs.   Vascular study limited, but negative 3. Pain Management:Neurontin 300 mg 3 times a day---increased to 400mg  tid with benefit  - hydrocodone as needed  -prn baclofen  Patellofemoral pain: voltaren gel helpful 4. Mood:Provide emotional support 5. Neuropsych: This patientiscapable of making decisions on herown behalf. 6. Skin/Wound Care:Routine skin checks 7. Fluids/Electrolytes/Nutrition:Routine ins and outs  Push PO 8.Proteus/Escherichia coli UTI. Recurrent Neurogenic bladder Bactrim completed 2/12, recurrent symptoms of dysuria, + UA, Cx proteus based on  C and S    -Cont Keflex through 02/24/2019 9.  Super obesity. Latest BMI 64.79. Dietary follow-up  - still some behavioral/diet choice issues going on here 10. Neurogenic bowel and bladder. Pt with GI discomfort today of unclear etiology  Flomax started on 2/8, peri-care ongoing   -continue senna-s at night  -hold miralax for now due to upset, soft stools  -will try QOD fleet enema 12.?  Anemia: hgb down to 9.9  2/20  -repeat CBC, improved Hgb to 10.7  13. Insomnia: scheduled trazodone      LOS: 24 days A FACE TO FACE EVALUATION WAS PERFORMED  [name] 02/22/2019, 10:22 AM

## 2019-02-22 NOTE — Progress Notes (Signed)
Occupational Therapy Session Note  Patient Details  Name: Ellen Hill MRN: [id number] Date of Birth: 1971/06/12  Today's Date: 02/22/2019 OT Individual Time: 1300-1417 OT Individual Time Calculation (min): 77 min   Short Term Goals: Week 3:  OT Short Term Goal 1 (Week 3): STGs=LTGs due to ELOS  Skilled Therapeutic Interventions/Progress Updates:    Pt greeted in w/c finishing up lunch. Agreeable to tx. Focus placed on sitting balance, LE weightbearing, and psychosocial wellbeing via coloring and dancing activity. Maxi lift transfer completed EOB, where pt rested LEs on wooden step. Able to maintain unsupported sitting balance with unilateral or bilateral UE support with CGA-supervision assist (supervision assist 90% of the time). Pt during session either engaged in meaningful coloring task for anterior weight shifting/core strengthening, or dancing to favorite music for activity tolerance and creating positive associations with upright sitting (as sitting unsupported typically increases feelings of anxiety for pt). At end of session pt returned to supine with 2 assist and was repositioned for comfort. Before OT left, pt stated "Thank you. I really needed that." Pt was left with all needs within reach at end of tx.     Therapy Documentation Precautions:  Precautions Precautions: Back, Fall Precaution Comments: obese. No brace needed Restrictions Weight Bearing Restrictions: No Vital Signs: Therapy Vitals Temp: 98.6 F (37 C) Temp Source: Oral Pulse Rate: 94 Resp: 20 BP: 127/87 Patient Position (if appropriate): Lying Oxygen Therapy SpO2: 100 % O2 Device: Room Air Pain: Pt c/o L LE pain. RN in to administer pain medication during session  Pain Assessment Pain Scale: 0-10 Pain Score: 0-No pain ADL:       Therapy/Group: Individual Therapy  [name] A Cordella Nyquist 02/22/2019, 4:05 PM

## 2019-02-22 NOTE — Progress Notes (Signed)
Physical Therapy Weekly Progress Note  Patient Details  Name: Ellen Hill MRN: [id number] Date of Birth: March 19, 1971  Beginning of progress report period: February 15, 2019 End of progress report period: February 22, 2019  Today's Date: 02/22/2019  Patient has met 1 of 3 short term goals.  Pt still requires anywhere from mod to max A for rolling in bed with use of bedrails. Pt is dependent for transfers via maxisky bed to/from w/c. Pt has been able to complete several slide board transfers with min-mod A x 2 and with increased time to complete transfer due to anxiety, body habitus, and need for cueing for sequencing of transfer. Pt is able to propel manual w/c x 150 ft with use of BUE and Supervision, although pt would benefit from use of power w/c mobility for increased independence with mobility due to significant effort required to propel manual w/c as well as manual w/c will not likely fit through doorways or be able to navigate tight spaces in patient's home. The patient is also not able to perform adequate pressure relief due to body habitus while seated in manual w/c and will require tilt feature present in power w/c for prevention of skin breakdown. Pt is able to maintain sitting balance EOM with CGA to min A. Pt's progress has been limited by body habitus and motor deficits from injury to spinal cord.  Patient continues to demonstrate the following deficits muscle weakness, impaired timing and sequencing, abnormal tone and unbalanced muscle activation and decreased sitting balance, decreased postural control and decreased balance strategies and therefore will continue to benefit from skilled PT intervention to increase functional independence with mobility.  Patient progressing toward long term goals..  Continue plan of care.  PT Short Term Goals Week 3:  PT Short Term Goal 1 (Week 3): Pt will complete rolling in bed with mod A and use of bedrails consistently PT Short Term Goal 1 -  Progress (Week 3): Progressing toward goal PT Short Term Goal 2 (Week 3): Pt will propel manual w/c x 150 ft with use of BUE and Supervision PT Short Term Goal 2 - Progress (Week 3): Met PT Short Term Goal 3 (Week 3): Pt will maintain sitting balance EOM with CGA consistently PT Short Term Goal 3 - Progress (Week 3): Progressing toward goal Week 4:  PT Short Term Goal 1 (Week 4): =LTG due to ELOS  Skilled Therapeutic Interventions/Progress Updates:  Balance/vestibular training;Community reintegration;Discharge planning;Disease management/prevention;DME/adaptive equipment instruction;Functional mobility training;Functional electrical stimulation;Neuromuscular re-education;Pain management;Patient/family education;Psychosocial support;Skin care/wound management;Splinting/orthotics;Therapeutic Activities;Therapeutic Exercise;UE/LE Strength taining/ROM;UE/LE Coordination activities;Wheelchair propulsion/positioning   Therapy Documentation Precautions:  Precautions Precautions: Back, Fall Precaution Comments: obese. No brace needed Restrictions Weight Bearing Restrictions: No  Therapy/Group: Individual Therapy   Excell Seltzer, PT, DPT 02/22/2019, 12:23 PM

## 2019-02-22 NOTE — Progress Notes (Signed)
Occupational Therapy Session Note  Patient Details  Name: Milka Windholz MRN: [id number] Date of Birth: [birth year]  Today's Date: 02/22/2019 OT Individual Time: 1015-1100 OT Individual Time Calculation (min): 45 min    Short Term Goals: Week 3:  OT Short Term Goal 1 (Week 3): STGs=LTGs due to ELOS  Skilled Therapeutic Interventions/Progress Updates:    Patient seated in w/c and ready for therapy session.  Patient participated in bilateral UE conditioning activities with ergometer 2 x 5 minutes, core strength and seated balance activities, UE dowel ex. Weight shifts, w/c push ups and lateral leans with difficulty due to body habitus.  She is pleasant and cooperative t/o session - note improved ability to flex trunk forward in SSP .  She remained in w/c at close of session with call bell and phone in reach.    Therapy Documentation Precautions:  Precautions Precautions: Back, Fall Precaution Comments: obese. No brace needed Restrictions Weight Bearing Restrictions: No General:   Vital Signs:   Pain: Pain Assessment Pain Scale: 0-10 Pain Score: 0-No pain   Therapy/Group: Individual Therapy  Carlos Levering 02/22/2019, 12:29 PM

## 2019-02-23 ENCOUNTER — Inpatient Hospital Stay (HOSPITAL_COMMUNITY): Payer: Self-pay | Admitting: Physical Therapy

## 2019-02-23 ENCOUNTER — Inpatient Hospital Stay (HOSPITAL_COMMUNITY): Payer: Self-pay | Admitting: Occupational Therapy

## 2019-02-23 NOTE — Patient Care Conference (Signed)
Inpatient RehabilitationTeam Conference and Plan of Care Update Date: 02/23/2019   Time: 2:10 PM    Patient Name: Anureet Bruington [facility]      Medical Record Number: [id number]  Date of Birth: 09/23/1971 Sex: Female         Room/Bed: 4W14C/4W14C-01 Payor Info: Payor: [place] / Plan: [facility] PPO / Product Type: *No Product type* /    Admitting Diagnosis: Thoracic myelopathy incomplete sci  Admit Date/Time:  01/29/2019  1:39 PM Admission Comments: No comment available   Primary Diagnosis:  <principal problem not specified> Principal Problem: <principal problem not specified>  Patient Active Problem List   Diagnosis Date Noted  . Anemia of chronic disease   . Leukocytosis   . Neurogenic bowel   . Neurogenic bladder   . E. coli UTI   . Myelopathy ([place]) 01/29/2019  . 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 Lumbar back pain with radiculopathy affecting lower extremity 12/08/2018  . Dysmenorrhea 04/13/2012  . Class 3 severe obesity due to excess calories without serious comorbidity with body mass index (BMI) of 60.0 to 69.9 in adult ([place]) 04/13/2012  . Anemia 04/13/2012  . Fibroids 04/13/2012  . Menorrhagia 03/07/2012    Expected Discharge Date: Expected Discharge Date: (SNF)  Team Members Present: Physician leading conference: Dr. Alger Simons Social Worker Present: Alfonse Alpers, LCSW Nurse Present: Other (comment)(Tonia Gane, LPN) PT Present: Other (comment)(Taylor Ervin Knack, PT) OT Present: Elisabeth Most, OT SLP Present: Charolett Bumpers, SLP PPS Coordinator present : Gunnar Fusi     Current Status/Progress Goal Weekly Team Focus  Medical   slow progress. bowel program has been effective but pt has been resistant. pain better  ongoing normalization of bowel program  see above, pain control, ongoing education, RX uti   Bowel/Bladder   On bowel program with scheduled enema LBM: 03/02; episodes of incontinences  regain continence of B/B; normal bowel pattern  B/B  program   Swallow/Nutrition/ Hydration             ADL's   2 assist for self care tasks/toileting/toilet transfers using Maxi sky. On night baths  mod A LB dressing and toileting with min A for overall goals  Sitting balance, functional transfers, self care retraining, pt/family education    Mobility   mod to max A rolling, dependent maxisky transfers, min to mod A x 2 slide board transfer, w/c mobility Supervision  min A overall, will likely downgrade  w/c eval this week, SB transfers, sitting balance, family education   Communication             Safety/Cognition/ Behavioral Observations            Pain   c/o pain LLE; scheduled gabapentin and PRN norco  pain <2/10  assess pain q shift and prn    Skin   healing fissure to buttock fold  no new skin infection/breakdown  assess skin q shift and prn     Rehab Goals Patient on target to meet rehab goals: No Rehab Goals Revised: Additional goals may need to be downgraded. *See Care Plan and progress notes for long and short-term goals.     Barriers to Discharge  Current Status/Progress Possible Resolutions Date Resolved   Physician    Neurogenic Bowel & Bladder        continue as above      Nursing                  PT  Inaccessible home environment;Decreased  caregiver support;Medical stability;Home environment access/layout;Incontinence;Weight                 OT                  SLP                SW                Discharge Planning/Teaching Needs:  Even though a first floor, one level unit has been secured, family is not allowed to move into it until April 9th.  Pt will not be able to get up/down the stairs at d/c and her level of care is too great at this time, so she and family would like to pursue short term SNF until unit  is ready and family is ready to care for pt at home.  Defer family education to next setting, although some education has already begun when sister is present.   Team Discussion:  Pt and team still  working on finding a bowel program that is acceptable and works for the pt.  Pt still has some c/o pain.  Pt is min/mod A, sometimes max A of 2 for slide board transfers, although they are not functional yet.  Pt still is needs to use the lift.  Pt had a custom w/c evaluation on Monday.  Pt's ADLs are difficult to do due to it's hard for pt to reach.  Pt's trunk control has improved and she is able to sit upright while completing tasks without assistance.  CSW to reach out to insurance case manager to discuss need for continued rehab at SNF level.  Revisions to Treatment Plan:  none    Continued Need for Acute Rehabilitation Level of Care: The patient requires daily medical management by a physician with specialized training in physical medicine and rehabilitation for the following conditions: Daily direction of a multidisciplinary physical rehabilitation program to ensure safe treatment while eliciting the highest outcome that is of practical value to the patient.: Yes Daily medical management of patient stability for increased activity during participation in an intensive rehabilitation regime.: Yes Daily analysis of laboratory values and/or radiology reports with any subsequent need for medication adjustment of medical intervention for : Neurological problems;Urological problems   I attest that I was present, lead the team conference, and concur with the assessment and plan of the team.   Nahiem Dredge, Silvestre Mesi 02/23/2019, 2:28 PM

## 2019-02-23 NOTE — Progress Notes (Signed)
Physical Therapy Session Note  Patient Details  Name: Ellen Hill MRN: [id number] Date of Birth: 1971/05/17  Today's Date: 02/23/2019 PT Individual Time: 1000-1100 PT Individual Time Calculation (min): 60 min   Short Term Goals: Week 4:  PT Short Term Goal 1 (Week 4): =LTG due to ELOS  Skilled Therapeutic Interventions/Progress Updates:    Pt received seated in w/c in room, agreeable to PT session. No complaints of pain. Pt's sister Ellen Hill present during therapy session, agreeable to perform hands-on family training and education. Discussed equipment that patient will require upon d/c home: power w/c, bariatric manual hoyer lift, and hospital bed. Dependent transfer via maxisky w/c to bed. Demonstration of how to provide max A for rolling L/R with pt's use of bedrails. Pt's sister is able to assist pt with rolling, will require continued education for best technique and body mechanics. Rolling L/R with max A for removal of sling. Reviewed how to ACE wrap BLE for edema and BP management. Pt's sister is able to wrap LLE with verbal cues and demonstration, will require continued education and therapy to provide handout. Reviewed BLE PROM stretching HEP: hip flexion, hip IR/ER, hip abd, knee flex, hamstrings, ankle DF/PF, heel cord stretch. Handout for stretching program provided, will continue to review. Pt left seated in bed with needs in reach, sister present.  Therapy Documentation Precautions:  Precautions Precautions: Back, Fall Precaution Comments: obese. No brace needed Restrictions Weight Bearing Restrictions: No Pain: Pain Assessment Pain Scale: 0-10 Pain Score: 0-No pain    Therapy/Group: Individual Therapy   Excell Seltzer, PT, DPT  02/23/2019, 12:06 PM

## 2019-02-23 NOTE — Progress Notes (Signed)
[place] PHYSICAL MEDICINE & REHABILITATION PROGRESS NOTE  Subjective/Complaints:  Slept fairly well. No specific new complaints. Happy that no bowel program this morning  ROS: Patient denies fever, rash, sore throat, blurred vision, nausea, vomiting, diarrhea, cough, shortness of breath or chest pain,   headache, or mood change.   Objective: Vital Signs: Blood pressure (!) 142/86, pulse (!) 101, temperature 98.1 F (36.7 C), resp. rate 17, height 5\' 6"  (1.676 m), SpO2 100 %. No results found. No results for input(s): WBC, HGB, HCT, PLT in the last 72 hours. No results for input(s): NA, K, CL, CO2, GLUCOSE, BUN, CREATININE, CALCIUM in the last 72 hours.  Physical Exam: BP (!) 142/86 (BP Location: Right Arm)   Pulse (!) 101   Temp 98.1 F (36.7 C)   Resp 17   Ht 5\' 6"  (1.676 m)   SpO2 100%   BMI 64.76 kg/m  Constitutional: No distress . Vital signs reviewed. HEENT: EOMI, oral membranes moist Neck: supple Cardiovascular: RRR without murmur. No JVD    Respiratory: CTA Bilaterally without wheezes or rales. Normal effort    GI: BS +, non-tender, non-distended  Musc: mild tenderness left knee, operative site tender to touch Neurological: She isalert and oriented. Motor: Bilateral UE 5/5.  RLE HF, KE 2/5, 3/5 ADF---  LLE: Trace toe flexion and extension left foot--no canges Senses pain and gross touch in both legs--no change 3/3 Skin: Skin iswarm.  Back incision not visualized Psychiatric:flat   Assessment/Plan: 1. 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  Physiatrist is providing close team supervision and 24 hour management of active medical problems listed below.  Physiatrist and rehab team continue to assess barriers to discharge/monitor patient progress toward functional and medical goals  Care Tool:  Bathing    Body  parts bathed by patient: Right arm, Left arm, Chest, Abdomen, Face   Body parts bathed by helper: Front perineal area, Buttocks, Right upper leg, Left upper leg, Right lower leg, Left lower leg     Bathing assist Assist Level: 2 Helpers     Upper Body Dressing/Undressing Upper body dressing   What is the patient wearing?: Pull over shirt    Upper body assist Assist Level: Minimal Assistance - Patient > 75%    Lower Body Dressing/Undressing Lower body dressing      What is the patient wearing?: Pants     Lower body assist Assist for lower body dressing: 2 Helpers     Toileting Toileting    Toileting assist Assist for toileting: 2 Helpers     Transfers Chair/bed transfer  Transfers assist     Chair/bed transfer assist level: Dependent - mechanical lift     Locomotion Ambulation   Ambulation assist   Ambulation activity did not occur: Safety/medical concerns          Walk 10 feet activity   Assist  Walk 10 feet activity did not occur: Safety/medical concerns        Walk 50 feet activity   Assist Walk 50 feet with 2 turns activity did not occur: Safety/medical concerns         Walk 150 feet activity   Assist Walk 150 feet activity did not occur: Safety/medical concerns         Walk 10 feet on uneven surface  activity   Assist Walk 10 feet on uneven surfaces activity did not occur: Safety/medical concerns  Wheelchair     Assist Will patient use wheelchair at discharge?: Yes Type of Wheelchair: Manual Wheelchair activity did not occur: Safety/medical concerns  Wheelchair assist level: Supervision/Verbal cueing Max wheelchair distance: 150'    Wheelchair 50 feet with 2 turns activity    Assist    Wheelchair 50 feet with 2 turns activity did not occur: Safety/medical concerns   Assist Level: Supervision/Verbal cueing   Wheelchair 150 feet activity     Assist Wheelchair 150 feet activity did not occur:  Safety/medical concerns   Assist Level: Supervision/Verbal cueing      Medical Problem List and Plan: 1.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 No back brace required  --Continue CIR therapies including PT, OT, team conf today    Slow improvement motor strength in LEs, still requiring Maxi sky lift PRAFO LLE at HS 2. DVT Prophylaxis/Anticoagulation: SCDs.   Vascular study limited, but negative 3. Pain Management:Neurontin 300 mg 3 times a day---increased to 400mg  tid with benefit  - hydrocodone as needed  -prn baclofen  Patellofemoral pain: voltaren gel helpful 4. Mood:Provide emotional support 5. Neuropsych: This patientiscapable of making decisions on herown behalf. 6. Skin/Wound Care:Routine skin checks 7. Fluids/Electrolytes/Nutrition:Routine ins and outs  Push PO 8.Proteus/Escherichia coli UTI. Recurrent Neurogenic bladder Bactrim completed 2/12, recurrent symptoms of dysuria, + UA, Cx proteus based on  C and S    -Cont Keflex through 02/24/2019 9.  Super obesity. Latest BMI 64.79. Dietary follow-up  - still some behavioral/diet choice issues going on here 10. Neurogenic bowel and bladder. Pt with GI discomfort today of unclear etiology  Flomax started on 2/8, peri-care ongoing   -continue senna-s at night  -hold miralax for now due to upset, soft stools  -changed to QOD fleet enema 12.?  Anemia: hgb down to 9.9  2/20  -repeat CBC, improved Hgb to 10.7  13. Insomnia: scheduled trazodone      LOS: 25 days A FACE TO FACE EVALUATION WAS PERFORMED  [name] 02/23/2019, 8:54 AM

## 2019-02-23 NOTE — Progress Notes (Signed)
Occupational Therapy Session Note  Patient Details  Name: Gwen Sarvis MRN: [id number] Date of Birth: 1971/03/14  Today's Date: 02/23/2019 OT Individual Time: 5400-8676 OT Individual Time Calculation (min): 80 min    Short Term Goals: Week 3:  OT Short Term Goal 1 (Week 3): STGs=LTGs due to ELOS  Skilled Therapeutic Interventions/Progress Updates:    Patient in bed upon arrival.  LB dressing max A of 2, OH lift to w/c with A of 2. Completed SB transfer w/c to mat with max A of 2 - feet on 4" platform.  Improved sitting balance/trunk control noted .  Use of bilateral push up blocks to move laterally on mat table with mod A of 2.  Return to w/c via [facility] lift.  Completed UB conditioning exercises and trunk mobility activities.  Returned to room, remained in w/c with call bell in reach.  Therapy Documentation Precautions:  Precautions Precautions: Back, Fall Precaution Comments: obese. No brace needed Restrictions Weight Bearing Restrictions: No General:   Vital Signs:  Pain: Pain Assessment Pain Scale: 0-10 Pain Score: 0-No pain  Therapy/Group: Individual Therapy  Carlos Levering 02/23/2019, 12:04 PM

## 2019-02-23 NOTE — Progress Notes (Signed)
Occupational Therapy Session Note  Patient Details  Name: Ellen Hill MRN: [id number] Date of Birth: 04/03/1971  Today's Date: 02/23/2019 OT Individual Time: 1430-1530 OT Individual Time Calculation (min): 60 min    Short Term Goals: Week 3:  OT Short Term Goal 1 (Week 3): STGs=LTGs due to ELOS  Skilled Therapeutic Interventions/Progress Updates:    Patient in bed and needing to be changed after voiding in brief.  Max A of 2 to remove brief, complete hygiene and redress.  OH lift to w/c.  Completed trunk control, UE conditioning and core strength exercises for remainder of session.  She is in good spirits today but tired at close of session.  Returned to bed via [place] for rest break.  Call bell and tray table in reach.  Therapy Documentation Precautions:  Precautions Precautions: Back, Fall Precaution Comments: obese. No brace needed Restrictions Weight Bearing Restrictions: No General:   Vital Signs:  Pain: Pain Assessment Pain Scale: 0-10 Pain Score: 1  :     Therapy/Group: Individual Therapy  Carlos Levering 02/23/2019, 3:35 PM

## 2019-02-24 ENCOUNTER — Inpatient Hospital Stay (HOSPITAL_COMMUNITY): Payer: Self-pay

## 2019-02-24 ENCOUNTER — Inpatient Hospital Stay (HOSPITAL_COMMUNITY): Payer: Self-pay | Admitting: Physical Therapy

## 2019-02-24 NOTE — Plan of Care (Signed)
  Problem: Consults Goal: RH SPINAL CORD INJURY PATIENT EDUCATION Description  See Patient Education module for education specifics.  Outcome: Progressing   Problem: SCI BOWEL ELIMINATION Goal: RH STG SCI MANAGE BOWEL PROGRAM W/ASSIST OR AS APPROPRIATE Description STG SCI Manage bowel program with min to mod assist or as appropriate.  Outcome: Not Progressing   Problem: RH SAFETY Goal: RH STG ADHERE TO SAFETY PRECAUTIONS W/ASSISTANCE/DEVICE Description STG Adhere to Safety Precautions With mod to max Assistance and with appropriate safety or assistive Device.  Outcome: Progressing   Problem: RH PAIN MANAGEMENT Goal: RH STG PAIN MANAGED AT OR BELOW PT'S PAIN GOAL Description <3 on a 1-10 pain scale  Outcome: Not Progressing Note:  Patient medicated with PRN Vicodin (2) for c/o pain 9/10 on a pain scale of 1-10.    Problem: RH KNOWLEDGE DEFICIT SCI Goal: RH STG INCREASE KNOWLEDGE OF SELF CARE AFTER SCI Description Patient will be able to demonstrate how to manage medications, diet, bowel and bladder with min assistance from rehab staff before discharge.  Outcome: Progressing

## 2019-02-24 NOTE — Progress Notes (Signed)
Occupational Therapy Session Note  Patient Details  Name: Ellen Hill MRN: [id number] Date of Birth: Mar 22, 1971  Today's Date: 02/24/2019 OT Individual Time: 1015-1100 and 1300 1400 OT Individual Time Calculation (min): 45 min and 60 min   Short Term Goals: Week 1:  OT Short Term Goal 1 (Week 1): Pt will tolerate sitting EOB/mat for 3 minutes for self care tasks.  OT Short Term Goal 1 - Progress (Week 1): Met OT Short Term Goal 2 (Week 1): Pt will perform UB self care with min A overall in order to reduce assistance with functional tasks.  OT Short Term Goal 2 - Progress (Week 1): Met OT Short Term Goal 3 (Week 1): Pt will demonstrate B UE strengthening HEP with supervision and use of paper handout as needed. OT Short Term Goal 3 - Progress (Week 1): Met  Skilled Therapeutic Interventions/Progress Updates:    Session 1: 1;1. Pt received in bed with no c/o pain. Total A for maxi sky transfer after MOD A rolling in bed to place sling. Pt completes UB therex/conditioning propeling w/c to/from part way to gym as well as 2# weighted shuttlecock activity focusing on shoulder strengthening. Exited session with pt returned back to room, call light in reach and all needs met  Session 2: Pt received supine in bed reporitng lunch should be delivered soon. Pt completes supine>sitting EOB with A for trunk elevation +2. Pt sits EOB with S to eat lunch with B feet support and VC for no UE support to challenge balance. Pt completes SBT down hill EOB>w/c with MAX A +2 with VC for trunk flexion and total A for LE management. Pt given w/c gloves and completes w/c mobility with improved speed d/t greater grasp. Pt completes 4 min UBE for improved BUE endurance and strengthening 2 min forward and 2 min backward at level 7. Exited session with pt seated in w/c, call light in reach and all needs met.  Therapy Documentation Precautions:  Precautions Precautions: Back, Fall Precaution Comments: obese. No  brace needed Restrictions Weight Bearing Restrictions: No   Therapy/Group: Individual Therapy  [name] 02/24/2019, 11:00 AM

## 2019-02-24 NOTE — Consult Note (Signed)
Neuropsychological Consultation   Patient:   Ellen Hill   DOB:   [birth year]  MR Number:  [id number]  Location:  [facility] [place] [id number] [place] [address] Dept: [place]: [phone]           Date of Service:   02/24/2019  Start Time:   9 AM End Time:   10 AM  Provider/Observer:  Ilean Skill, Psy.D.       Clinical Neuropsychologist       Billing Code/Service: 96158/96159  Chief Complaint:    Ellen Hill is a 48 year old female with history of migraine headaches, obesity with BMI 64.76.  Presented 01/17/2019 with gait instability bilateral lower extremity weakness x2.5 months to the point of being essentially nonambulatory.  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  Patient underwent surgery with Dr. Annette Stable.  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    Today (02/24/2019) I followed up with the patient regarding concerns about motivation and the development of depression.  By the patient acknowledged that she is dealing with motivation and depressive symptoms at times she does realize the realities of what is happening.  The patient will not be transferred home but will likely be going to a skilled nursing facility following her discharge from our unit.  While there have been improvements in her overall functioning since admission the patient continues to need significant assistance with transfer and movement and her current living situation she is on the second floor.  This is expected to change in April to a first floor but the patient is still morbidly obese and unable to stand or walk independently.  The patient would meet the criterion for an adjustment disorder with depressed mood.  Reason for  Service:  Patient referred for neuropsychological consultation due to coping and adjustment issues.  Below is the HPI for the current admission.    OAC:ZYSAY Ellen Hill is a 48 year old right-handed female with history of migraine headaches, obesity with BMI 64.76. Per chart review patient lives with her sister 56 year old daughter. One level apartment with multiple steps to entry. 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 Sister works during the day. She has a neighbor friend who planned to assist she would stay with on discharge with accessible home. Presented 01/17/2019 with gait instability bilateral lower extremity weakness 2-1/2 months to the point she was essentially nonambulatory. 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 Patient underwent T9, T10, T11-12 and partial L1 decompressive laminotomies 01/19/2019 per Dr. Annette Stable. No back brace required. Surgical PCR screening positive for contact precautions. Hospital course ongoing pain management. Decadron protocol as indicated. Elevated WBC 20,500 decreased to 17,10monitored closely while on steroid therapy with urine culture completed 01/27/2019 greater 100,000 Proteus and 50,000 Escherichia coli andcurrently maintained on Bactrim. Bouts of diarrhea noted patient initially did have a a rectal tube in place and since has been removed.Therapy evaluations completed with recommendations of physical medicine rehabilitation consult. Patient was admitted for a comprehensive rehabilitation program.  Current Status:  The patient is having increasing symptoms of depression although anxiety has not been as problematic as it was initially.  The patient  is dealing with the reality now that she will not be getting transferred home but will be transferred initially to a skilled nursing  facility.  Behavioral Observation: Ellen Hill  presents as a 48 y.o.-year-old Right African American Female who appeared her stated age. her dress was Appropriate and she was Well Groomed and her manners were Appropriate to the situation.  her participation was indicative of Appropriate and Attentive behaviors.  There were any physical disabilities noted.  she displayed an appropriate level of cooperation and motivation.     Interactions:    Active Appropriate and Attentive  Attention:   within normal limits and attention span and concentration were age appropriate  Memory:   within normal limits; recent and remote memory intact  Visuo-spatial:  not examined  Speech (Volume):  low  Speech:   normal; normal  Thought Process:  Coherent and Relevant  Though Content:  WNL; not suicidal and not homicidal  Orientation:   person, place, time/date and situation  Judgment:   Good  Planning:   Fair  Affect:    Anxious  Mood:    Anxious  Insight:   Good  Intelligence:   normal  Medical History:   Past Medical History:  Diagnosis Date  . Abnormal Pap smear    history  . Anemia   . ASCUS (atypical squamous cells of undetermined significance) on Pap smear 2008   At 36wk of pregnancy ; colpo  . Asthma    rarely uses inhaler (seasonal)  . 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 Fibroids   . H/O candidiasis   . H/O fatigue 08/2007  . H/O varicella   . Headache(784.0)    migraines   . Heart murmur    dx child - no problems as adult  . Hx: UTI (urinary tract infection) 07/29/07  . Increased BMI   . Irregular bleeding   . Migraines   . Obesity 02/13/12  . Pelvic pain 02/13/12  . Postpartum anemia 08/18/07  . 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 after loss of parents, is fine  . Vaginal Pap smear, abnormal    f/u wnl     Psychiatric History:  Patient denies prior psychiatric history.  Family Med/Psych History:  Family History  Problem Relation Age of Onset  .  Hypertension Mother   . Cancer Mother        Breast  . Breast cancer Mother   . Hypertension Father   . Cancer Father        Prostate  . Heart attack Father   . Hypertension Sister   . Diabetes Maternal Aunt     Risk of Suicide/Violence: low Patient denies Si or HI.    Impression/DX:  Ellen Hill is a 48 year old female with history of migraine headaches, obesity with BMI 64.76.  Presented 01/17/2019 with gait instability bilateral lower extremity weakness x2.5 months to the point of being essentially nonambulatory.  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  Patient underwent surgery with Dr. Annette Stable.  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   Today (02/24/2019) I followed up with the patient regarding concerns about motivation and the development of depression.  By the patient acknowledged that she is dealing with motivation and depressive symptoms at times she does realize the realities of what is happening.  The patient will  not be transferred home but will likely be going to a skilled nursing facility following her discharge from our unit.  While there have been improvements in her overall functioning since admission the patient continues to need significant assistance with transfer and movement and her current living situation she is on the second floor.  This is expected to change in April to a first floor but the patient is still morbidly obese and unable to stand or walk independently.  The patient would meet the criterion for an adjustment disorder with depressed mood.  The patient is having increasing symptoms of depression although anxiety has not been as problematic as it was initially.  The patient is dealing with the reality now that she will not be getting transferred home but will be transferred initially to  a skilled nursing facility.    Disposition/Plan:  I do think that we should consider adding an SSRI medication to her current medication regimen.  I will speak with the attending about this possibility and get his input.           Electronically Signed   _______________________ Ilean Skill, Psy.D.

## 2019-02-24 NOTE — Progress Notes (Addendum)
[facility] PHYSICAL MEDICINE & REHABILITATION PROGRESS NOTE  Subjective/Complaints:  Slept well again. No new complaints. Success with bowel program this morning  ROS: Patient denies fever, rash, sore throat, blurred vision, nausea, vomiting, diarrhea, cough, shortness of breath or chest pain,  , headache, or mood change.    Objective: Vital Signs: Blood pressure 128/74, pulse 98, temperature 98.5 F (36.9 C), temperature source Oral, resp. rate 18, height 5\' 6"  (1.676 m), SpO2 97 %. No results found. No results for input(s): WBC, HGB, HCT, PLT in the last 72 hours. No results for input(s): NA, K, CL, CO2, GLUCOSE, BUN, CREATININE, CALCIUM in the last 72 hours.  Physical Exam: BP 128/74 (BP Location: Left Arm)   Pulse 98   Temp 98.5 F (36.9 C) (Oral)   Resp 18   Ht 5\' 6"  (1.676 m)   SpO2 97%   BMI 64.76 kg/m  Constitutional: No distress . Vital signs reviewed. HEENT: EOMI, oral membranes moist Neck: supple Cardiovascular: RRR without murmur. No JVD    Respiratory: CTA Bilaterally without wheezes or rales. Normal effort    GI: BS +, non-tender, non-distended  Musc: mild tenderness left knee, operative site tender to touch Neurological: She isalert and oriented. Motor: Bilateral UE 5/5.  RLE HF, KE 2/5, 3/5 ADF---  LLE: Trace toe flexion and extension left foot--no canges Senses pain and gross touch in both legs--stable 3/4 Skin: Skin iswarm.  Back incision not visualized Psychiatric:flat   Assessment/Plan: 1. 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  Physiatrist is providing close team supervision and 24 hour management of active medical problems listed below.  Physiatrist and rehab team continue to assess barriers to discharge/monitor patient progress toward functional and medical goals  Care Tool:  Bathing    Body  parts bathed by patient: Right arm, Left arm, Chest, Abdomen, Face   Body parts bathed by helper: Front perineal area, Buttocks, Right upper leg, Left upper leg, Right lower leg, Left lower leg     Bathing assist Assist Level: 2 Helpers     Upper Body Dressing/Undressing Upper body dressing   What is the patient wearing?: Pull over shirt    Upper body assist Assist Level: Minimal Assistance - Patient > 75%    Lower Body Dressing/Undressing Lower body dressing      What is the patient wearing?: Pants     Lower body assist Assist for lower body dressing: 2 Helpers     Toileting Toileting    Toileting assist Assist for toileting: 2 Helpers     Transfers Chair/bed transfer  Transfers assist     Chair/bed transfer assist level: Dependent - mechanical lift     Locomotion Ambulation   Ambulation assist   Ambulation activity did not occur: Safety/medical concerns          Walk 10 feet activity   Assist  Walk 10 feet activity did not occur: Safety/medical concerns        Walk 50 feet activity   Assist Walk 50 feet with 2 turns activity did not occur: Safety/medical concerns         Walk 150 feet activity   Assist Walk 150 feet activity did not occur: Safety/medical concerns         Walk 10 feet on uneven surface  activity   Assist Walk 10 feet on uneven surfaces activity did not occur: Safety/medical concerns  Wheelchair     Assist Will patient use wheelchair at discharge?: Yes Type of Wheelchair: Manual Wheelchair activity did not occur: Safety/medical concerns  Wheelchair assist level: Supervision/Verbal cueing Max wheelchair distance: 150'    Wheelchair 50 feet with 2 turns activity    Assist    Wheelchair 50 feet with 2 turns activity did not occur: Safety/medical concerns   Assist Level: Supervision/Verbal cueing   Wheelchair 150 feet activity     Assist Wheelchair 150 feet activity did not occur:  Safety/medical concerns   Assist Level: Supervision/Verbal cueing      Medical Problem List and Plan: 1.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 No back brace required  --Continue CIR therapies including PT, OT     Slow improvement motor strength in LEs, still requiring Maxi sky lift PRAFO LLE at HS  -seeking SNF  Mobility Assessment  Ellen Hill was seen today for the purpose of a mobility assessment for a powered wheelchair. I have reviewed and agree with the detailed PT evaluation. 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 Due to her spinal cord injury, she is unable to utilize a cane, walker, manual wheelchair, or scooter. The patient is appropriate for a customized power wheelchair.    With a power wheelchair  she can move independently at a household level and on a limited basis in the community. The chair will also allow the patient to perform ADL's more independently and easily. The patient is competent to operate the recommended chair on her own and is motivated to utilize the chair on a daily basis.     2. DVT Prophylaxis/Anticoagulation: SCDs.   Vascular study limited, but negative 3. Pain Management:Neurontin 300 mg 3 times a day---increased to 400mg  tid with benefit  - hydrocodone as needed  -prn baclofen  Patellofemoral pain: voltaren gel helpful 4. Mood:Provide emotional support 5. Neuropsych: This patientiscapable of making decisions on herown behalf. 6. Skin/Wound Care:Routine skin checks 7. Fluids/Electrolytes/Nutrition:Routine ins and outs  Push PO 8.Proteus/Escherichia coli UTI. Recurrent Neurogenic bladder Bactrim completed 2/12, recurrent symptoms of dysuria, + UA, Cx proteus based on  C and S    -Cont Keflex through today 9.  Super obesity. Latest BMI 64.79. Dietary follow-up  - still some behavioral/diet choice issues  going on here 10. Neurogenic bowel and bladder. Pt with GI discomfort today of unclear etiology  Flomax started on 2/8, peri-care ongoing   -continue senna-s at night  -hold miralax for now due to upset, soft stools  -changed to QOD fleet enema--results this morning 12.?  Anemia: hgb down to 9.9  2/20  -repeat CBC, improved Hgb to 10.7  13. Insomnia: scheduled trazodone      LOS: 26 days A FACE TO FACE EVALUATION WAS PERFORMED  Ellen Hill 02/24/2019, 9:15 AM

## 2019-02-24 NOTE — Progress Notes (Signed)
Social Work Patient ID: Ellen Hill, female   DOB: 09/04/1971, 47 y.o.   MRN: [id number]   CSW met with pt to update her on the team conference discussion and to talk about pt's d/c plan.  CSW also talked with pt's sister, Cheryl, who stated that their first floor unit would not be available until April 9th.  In addition, some of pt's goals were downgraded and it is expected that pt will need more hands on assistance than we had hoped for.  Pt/sister feel pt will need a step in between CIR and home and they would like for CSW to pursue SNF care.  CSW has left a message for the case manager at [employer] and has given pt a SNF list.  CSW will assist pt/family with SNF process.  CSW has facilitated paperwork for pt and her sister and their employers.  CSW continued to follow and assist as needed.  

## 2019-02-24 NOTE — Progress Notes (Signed)
Physical Therapy Session Note  Patient Details  Name: Ellen Hill MRN: [id number] Date of Birth: 04-29-1971  Today's Date: 02/24/2019 PT Individual Time: 0835-0900; 1415-1540 PT Individual Time Calculation (min): 25 min and 85 min  Short Term Goals: Week 4:  PT Short Term Goal 1 (Week 4): =LTG due to ELOS  Skilled Therapeutic Interventions/Progress Updates:    Session 1: Pt received seated in bed, agreeable to PT session. No complaints of pain this AM but does report soreness in BLE following PROM yesterday which resolved over time. Rolling L/R with max A and use of bedrails for depending donning of pants, BLE ACE wrap. Pt left seated in bed with needs in reach at end of therapy session.  Session 2: Pt received seated in w/c in room, agreeable to PT session. No complaints of pain. Pt reports feeling very fatigued this PM as well as overwhelmed with regards to d/c plan, etc. Provided emotional support to patient. Manual w/c propulsion x 150 ft with use of BUE and increased time, Supervision. Maxisky transfer w/c to mat table. Pt declines to perform a slide board transfer this PM due to already performing one during OT session. Encouraged pt to complete another transfer as the more practice she can obtain the better, pt declines. Session focus on seated balance EOM: anterior leans onto therapy ball with min A; anterior leans onto chair armrests with min A. Pt is able to maintain static sitting balance EOM with close Supervision. Maxisky transfer back to w/c then back to bed. Rolling L/R with max A and use of bedrails for removal of maxi sling and for dependent brief change/pericare following BM smear in brief. Pt left seated in bed with needs in reach.  Therapy Documentation Precautions:  Precautions Precautions: Back, Fall Precaution Comments: obese. No brace needed Restrictions Weight Bearing Restrictions: No    Therapy/Group: Individual Therapy   Excell Seltzer, PT,  DPT  02/24/2019, 9:27 AM

## 2019-02-25 ENCOUNTER — Inpatient Hospital Stay (HOSPITAL_COMMUNITY): Payer: Self-pay

## 2019-02-25 ENCOUNTER — Inpatient Hospital Stay (HOSPITAL_COMMUNITY): Payer: Self-pay | Admitting: Occupational Therapy

## 2019-02-25 ENCOUNTER — Inpatient Hospital Stay (HOSPITAL_COMMUNITY): Payer: Self-pay | Admitting: Physical Therapy

## 2019-02-25 NOTE — NC FL2 (Signed)
[place] LEVEL OF CARE SCREENING TOOL     IDENTIFICATION  Patient Name: Ellen Hill [facility] Birthdate: 1971/10/15 Sex: female Admission Date (Current Location): 01/29/2019  [facility] and [place] Number:  Herbalist and Address:  [facility], [place] [address] [place], [place] [address]      Provider Number: [id number]  Attending Physician Name and Address:  [name], MD  Relative Name and Phone Number:       Current Level of Care: Other (Comment)(Comprehensive Inpatient Rehabilitation) Recommended Level of Care: [place] Prior Approval Number:    Date Approved/Denied:   PASRR Number: [id number]  Discharge Plan: SNF    Current Diagnoses: Patient Active Problem List   Diagnosis Date Noted  . Anemia of chronic disease   . Leukocytosis   . Neurogenic bowel   . Neurogenic bladder   . E. coli UTI   . Myelopathy ([place]) 01/29/2019  . 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 Lumbar back pain with radiculopathy affecting lower extremity 12/08/2018  . Dysmenorrhea 04/13/2012  . Class 3 severe obesity due to excess calories without serious comorbidity with body mass index (BMI) of 60.0 to 69.9 in adult ([place]) 04/13/2012  . Anemia 04/13/2012  . Fibroids 04/13/2012  . Menorrhagia 03/07/2012    Orientation RESPIRATION BLADDER Height & Weight     Self, Time, Situation, Place  Normal Continent(incontinent at times) Weight: (couldn't get bed to weigh properly) Height:  5\' 6"  (167.6 cm)  BEHAVIORAL SYMPTOMS/MOOD NEUROLOGICAL BOWEL NUTRITION STATUS      Continent(bowel program - enema every other day) Diet(regular diet, thin liquids)  AMBULATORY STATUS COMMUNICATION OF NEEDS Skin   Supervision(wheelchair level) Verbally Other (Comment)(healing fissure to buttock fold)                       Personal Care Assistance Level of Assistance  Bathing, Feeding, Dressing Bathing Assistance: Maximum assistance Feeding  assistance: Independent Dressing Assistance: Maximum assistance     Functional Limitations Info             SPECIAL CARE FACTORS FREQUENCY  PT (By licensed PT), OT (By licensed OT), Bowel and bladder program, Blood pressure Blood Pressure Frequency: per facility protocol   PT Frequency: 5-7x/week OT Frequency: 5-7x/week Bowel and Bladder Program Frequency: every other day enema; work on regaining full continence          Contractures Contractures Info: Not present    Additional Factors Info  Code Status, Allergies Code Status Info: full Allergies Info: no known allergies           Current Medications (02/25/2019):  This is the current hospital active medication list Current Facility-Administered Medications  Medication Dose Route Frequency Provider Last Rate Last Dose  . acetaminophen (TYLENOL) tablet 650 mg  650 mg Oral Q4H PRN Cathlyn Parsons, PA-C   650 mg at 02/01/19 9798   Or  . acetaminophen (TYLENOL) suppository 650 mg  650 mg Rectal Q4H PRN Angiulli, Lavon Paganini, PA-C      . alum & mag hydroxide-simeth (MAALOX/MYLANTA) 200-200-20 MG/5ML suspension 30 mL  30 mL Oral Q6H PRN Cathlyn Parsons, PA-C   30 mL at 02/19/19 2040  . baclofen (LIORESAL) tablet 10 mg  10 mg Oral Q8H PRN [name], MD   10 mg at 02/24/19 1553  . diclofenac sodium (VOLTAREN) 1 % transdermal gel 2 g  2 g Topical QID Kirsteins, Luanna Salk, MD   2  g at 02/25/19 0934  . gabapentin (NEURONTIN) capsule 400 mg  400 mg Oral TID [name], MD   400 mg at 02/25/19 0933  . HYDROcodone-acetaminophen (NORCO/VICODIN) 5-325 MG per tablet 1-2 tablet  1-2 tablet Oral Q4H PRN Cathlyn Parsons, PA-C   2 tablet at 02/25/19 1030  . iron polysaccharides (NIFEREX) capsule 150 mg  150 mg Oral Daily [name], MD   150 mg at 02/25/19 0934  . ondansetron (ZOFRAN) tablet 4 mg  4 mg Oral Q6H PRN Angiulli, Lavon Paganini, PA-C       Or  . ondansetron [facility]) injection 4 mg  4 mg Intravenous Q6H PRN  Angiulli, Lavon Paganini, PA-C      . senna-docusate (Senokot-S) tablet 2 tablet  2 tablet Oral QHS [name], MD   2 tablet at 02/24/19 2017  . sodium phosphate (FLEET) 7-19 GM/118ML enema 1 enema  1 enema Rectal P1121 Charlett Blake, MD   1 enema at 02/24/19 0506  . sorbitol 70 % solution 30 mL  30 mL Oral Daily PRN Cathlyn Parsons, PA-C   30 mL at 02/16/19 1152  . tamsulosin (FLOMAX) capsule 0.4 mg  0.4 mg Oral QPC supper Jamse Arn, MD   0.4 mg at 02/24/19 1747  . traZODone (DESYREL) tablet 50 mg  50 mg Oral QHS [name], MD   50 mg at 02/24/19 2017     Discharge Medications: Please see discharge summary for a list of discharge medications.  Relevant Imaging Results:  Relevant Lab Results:   Additional Information SSN:  [id number]; Pt working on [employer] txs, but routinely using the [employer].  Pt is around 400lbs.  Tavares Levinson, Silvestre Mesi, LCSW

## 2019-02-25 NOTE — Progress Notes (Signed)
Occupational Therapy Session Note  Patient Details  Name: Calaya Gildner MRN: [id number] Date of Birth: 03-01-1971  Today's Date: 02/25/2019 OT Individual Time: 0700-0800 OT Individual Time Calculation (min): 60 min    Short Term Goals: Week 3:  OT Short Term Goal 1 (Week 3): STGs=LTGs due to ELOS  Skilled Therapeutic Interventions/Progress Updates:    Upon entering the room, pt supine in bed with no c/o pain this session and agreeable to OT intervention. OT wrapping B LEs with ACE wrap and needing total A to thread onto B feet and pt is able to pull pants up in front. Pt rolling L <> R with max A and use of bed rails while therapist pulling pants over B hips. Pt requesting LE stretching this session. Pt has LE PROM stretching HEP that therapist reviewed with pt verbalizing increased discomfort with PROM and first but LEs feeling much better after. PROM to B ankles, knees, and hips in all planes of movement x 5 reps each. Pt remaining in bed at end of session with call bell and all needed items within reach.   Therapy Documentation Precautions:  Precautions Precautions: Back, Fall Precaution Comments: obese. No brace needed Restrictions Weight Bearing Restrictions: No General:   Vital Signs: Therapy Vitals Temp: 98.2 F (36.8 C) Temp Source: Oral Pulse Rate: 91 BP: 122/82 Patient Position (if appropriate): Sitting Oxygen Therapy SpO2: 98 % O2 Device: Room Air Pain: Pain Assessment Pain Scale: 0-10 Pain Score: 6  Pain Location: Leg Pain Orientation: Left Pain Descriptors / Indicators: Aching Patients Stated Pain Goal: 3 Pain Intervention(s): Medication (See eMAR)   Therapy/Group: Individual Therapy    [name] 02/25/2019, 10:45 AM

## 2019-02-25 NOTE — Progress Notes (Signed)
Reported dull ache left chest. Non radiating , non increasing with dep breath, not Hikeem Andersson or pressure sensation. Reported started yesterday , has not increased or decreased , just nagging. VSS. No SOB or other symptoms noted. Alcide Goodness PAC notified.  Margarito Liner

## 2019-02-25 NOTE — Progress Notes (Signed)
Physical Therapy Session Note  Patient Details  Name: Wayne Brunker MRN: [id number] Date of Birth: September 29, 1971  Today's Date: 02/25/2019 PT Individual Time: 1415-1530 PT Individual Time Calculation (min): 75 min   Short Term Goals: Week 4:  PT Short Term Goal 1 (Week 4): =LTG due to ELOS  Skilled Therapeutic Interventions/Progress Updates:    Patient in supine willing to work and do whatever needed to get mobility better.  Assisted to w/c via maxisky.  Patient assisted to gym with focus of session on sitting forward and improving stability and confidence with sitting forward.  Sitting in front of parallel bars and pulling up and reaching for horseshoes and placing on basketball goal, then reaching for ball and placing through hoop.  Then sitting holding onto ball with two hands, then with hands on knees while sitting forward feet supported and no UE support up to 20 seconds.  Patient then participated in sitting leaning forward at table for checkers game leaning mostly on R elbow some without support with cues to utilize R UE to move pieces on checker board.  Patient assisted to room and into bed per her request with maxi sky stating would get up later with staff when visitors here.  Left with call button and needs in reach.   Therapy Documentation Precautions:  Precautions Precautions: Back, Fall Precaution Comments: obese. No brace needed Restrictions Weight Bearing Restrictions: No Pain: Pain Assessment Pain Scale: 0-10 Pain Score: 6  Faces Pain Scale: Hurts even more Pain Type: Acute pain Pain Location: Leg Pain Orientation: Left Pain Descriptors / Indicators: Spasm Patients Stated Pain Goal: 3 Pain Intervention(s): Repositioned    Therapy/Group: Individual Therapy  Reginia Naas 02/25/2019, 5:54 PM

## 2019-02-25 NOTE — Progress Notes (Signed)
Physical Therapy Session Note  Patient Details  Name: Ellen Hill MRN: [id number] Date of Birth: June 23, 1971  Today's Date: 02/25/2019 PT Individual Time: 1045-1200 PT Individual Time Calculation (min): 75 min   Short Term Goals: Week 4:  PT Short Term Goal 1 (Week 4): =LTG due to ELOS  Skilled Therapeutic Interventions/Progress Updates:    Patient received in bed, pleasant and willing to participate in PT session; able to roll side to side for placement of maxisky pad with modA, then performed dependent lift to chair in [place]. Patient able to self-propel herself to PT gym with B UEs and multiple rest breaks due to UE muscle soreness, then was transferred to mat table using [facility] lift. Focused this session on pre-transfer training including forward translation of head, head-hips relationship, and introduction of small lateral scoots as precursor to sliding board use. Mod-Max cues and extended time provided for all activities today due to patient's high levels of fear with mobility and impaired balance at edge of mat table. Worked on anterior weight shift and then rolling therapy ball forward with fingertips to increase use of trunk musculature and reducing heavy reliance on B UE support, then progressed to anterior weight shift/lightly clearing hips from mat table without ball in front of her. Attempted very small lateral scoot but patient unable due to fear of falling from table, ultimately able to achieve this with MaxAx2 and one person guarding patient in front. She was returned to her WC in [place], and also returned to bed in [place] lift, left in bed with all needs met this morning.   Therapy Documentation Precautions:  Precautions Precautions: Back, Fall Precaution Comments: obese. No brace needed Restrictions Weight Bearing Restrictions: No General:   Pain: Pain Assessment Pain Scale: 0-10 Pain Score: 2  Pain Type: Acute pain Pain Location: Chest Pain Orientation:  Left Pain Descriptors / Indicators: Aching;Nagging Pain Onset: On-going Patients Stated Pain Goal: 0 Pain Intervention(s): RN made aware;Repositioned;Ambulation/increased activity Multiple Pain Sites: No    Therapy/Group: Individual Therapy  Deniece Ree PT, DPT, CBIS  Supplemental Physical Therapist [facility]    Pager [phone] Acute Rehab Office [phone]   02/25/2019, 12:29 PM

## 2019-02-25 NOTE — Progress Notes (Signed)
[place] PHYSICAL MEDICINE & REHABILITATION PROGRESS NOTE  Subjective/Complaints:  Overall without new issues. A little anxious yesterday regarding dispo, planning  ROS: Patient denies fever, rash, sore throat, blurred vision, nausea, vomiting, diarrhea, cough, shortness of breath or chest pain, joint or back pain, headache, or mood change. .    Objective: Vital Signs: Blood pressure (!) 138/7, pulse (!) 102, temperature 98.2 F (36.8 C), temperature source Oral, resp. rate 18, height 5\' 6"  (1.676 m), SpO2 98 %. No results found. No results for input(s): WBC, HGB, HCT, PLT in the last 72 hours. No results for input(s): NA, K, CL, CO2, GLUCOSE, BUN, CREATININE, CALCIUM in the last 72 hours.  Physical Exam: BP (!) 138/7 (BP Location: Right Arm)   Pulse (!) 102   Temp 98.2 F (36.8 C) (Oral)   Resp 18   Ht 5\' 6"  (1.676 m)   SpO2 98%   BMI 64.76 kg/m  Constitutional: No distress . Vital signs reviewed. HEENT: EOMI, oral membranes moist Neck: supple Cardiovascular: RRR without murmur. No JVD    Respiratory: CTA Bilaterally without wheezes or rales. Normal effort    GI: BS +, non-tender, non-distended  Musc: mild tenderness left knee, LB tender Neurological: She isalert and oriented. Motor: Bilateral UE 5/5.  RLE HF, KE 2/5, 3/5 ADF---  LLE: 1/5 flexion and extension left foot--1/5 KE Senses pain and gross touch in both legs--stable sensory Skin: Skin iswarm.  Back incision not visualized Psychiatric: pleasant   Assessment/Plan: 1. 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  Physiatrist is providing close team supervision and 24 hour management of active medical problems listed below.  Physiatrist and rehab team continue to assess barriers to discharge/monitor patient progress toward functional and medical goals  Care  Tool:  Bathing    Body parts bathed by patient: Right arm, Left arm, Chest, Abdomen, Face   Body parts bathed by helper: Front perineal area, Buttocks, Right upper leg, Left upper leg, Right lower leg, Left lower leg     Bathing assist Assist Level: 2 Helpers     Upper Body Dressing/Undressing Upper body dressing   What is the patient wearing?: Pull over shirt    Upper body assist Assist Level: Minimal Assistance - Patient > 75%    Lower Body Dressing/Undressing Lower body dressing      What is the patient wearing?: Pants     Lower body assist Assist for lower body dressing: 2 Helpers     Toileting Toileting    Toileting assist Assist for toileting: 2 Helpers     Transfers Chair/bed transfer  Transfers assist     Chair/bed transfer assist level: Dependent - mechanical lift     Locomotion Ambulation   Ambulation assist   Ambulation activity did not occur: Safety/medical concerns          Walk 10 feet activity   Assist  Walk 10 feet activity did not occur: Safety/medical concerns        Walk 50 feet activity   Assist Walk 50 feet with 2 turns activity did not occur: Safety/medical concerns         Walk 150 feet activity   Assist Walk 150 feet activity did not occur: Safety/medical concerns         Walk 10 feet on uneven surface  activity   Assist Walk 10 feet on uneven surfaces activity did not occur: Safety/medical  concerns         Wheelchair     Assist Will patient use wheelchair at discharge?: Yes Type of Wheelchair: Manual Wheelchair activity did not occur: Safety/medical concerns  Wheelchair assist level: Supervision/Verbal cueing Max wheelchair distance: 150'    Wheelchair 50 feet with 2 turns activity    Assist    Wheelchair 50 feet with 2 turns activity did not occur: Safety/medical concerns   Assist Level: Supervision/Verbal cueing   Wheelchair 150 feet activity     Assist Wheelchair 150  feet activity did not occur: Safety/medical concerns   Assist Level: Supervision/Verbal cueing      Medical Problem List and Plan: 1.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 No back brace required  --Continue CIR therapies including PT, OT     Slow improvement motor strength in LEs, still requiring Maxi sky lift PRAFO LLE at HS  -seeking SNF given lack of social supports 2. DVT Prophylaxis/Anticoagulation: SCDs.   Vascular study limited, but negative 3. Pain Management:Neurontin 300 mg 3 times a day---increased to 400mg  tid with benefit  - hydrocodone as needed  -prn baclofen  Patellofemoral pain: voltaren gel helpful 4. Mood:Provide emotional support 5. Neuropsych: This patientiscapable of making decisions on herown behalf. 6. Skin/Wound Care:Routine skin checks 7. Fluids/Electrolytes/Nutrition:Routine ins and outs  Push PO 8.Proteus/Escherichia coli UTI. Recurrent Neurogenic bladder Bactrim completed 2/12, recurrent symptoms of dysuria, + UA, Cx proteus based on  C and S    -keflex completed 9.  Super obesity. Latest BMI 64.79. Dietary follow-up  - still some behavioral/diet choice issues going on here 10. Neurogenic bowel and bladder. Pt with GI discomfort today of unclear etiology  Flomax started on 2/8, peri-care ongoing   -continue senna-s at night  -hold miralax for now due to upset, soft stools  -changed to QOD fleet enema--results this morning 12.?  Anemia: hgb down to 9.9  2/20  -repeat CBC, improved Hgb to 10.7  13. Insomnia: scheduled trazodone      LOS: 27 days A FACE TO FACE EVALUATION WAS PERFORMED  [name] 02/25/2019, 9:17 AM

## 2019-02-25 NOTE — Progress Notes (Signed)
Occupational Therapy Weekly Progress Note  Patient Details  Name: Ellen Hill MRN: [id number] Date of Birth: [birth year]  Beginning of progress report period: February 15, 2019 End of progress report period: February 25, 2019   Patient has met 0 of 7 long term goals.  Short term goals not set due to estimated length of stay. Pt continued to require max A for functional bed mobility for hygiene and clothing management and continues to be dependent with maxisky for bed <>wheelchair transfer. Pt quite often declines bowel program and has been educated frequently on importance. Pt declines toilet transfer as well. Pt has been able to don pull over shirt with min - mod A and is currently on night bath. Pt attempting to help pull pants up from front during LB clothing management as well. Pt's progress has been limited by body habitus and motor deficits from injury to spinal cord.   Patient continues to demonstrate the following deficits: muscle weakness, decreased cardiorespiratoy endurance, decreased coordination, decreased balance, decreased bed mobility, decreased sensation, increased pain and decreased balance strategies and therefore will continue to benefit from skilled OT intervention to enhance overall performance with Reduce care partner burden.  See Patient's Care Plan for progression toward long term goals.  Patient not progressing toward long term goals.  See goal revision..  Plan of care revisions: toileting and toilet transfer downgraded to total A and LB dressing downgraded to max A.  Skilled Therapeutic Interventions/Progress Updates:      Therapy Documentation Precautions:  Precautions Precautions: Back, Fall Precaution Comments: obese. No brace needed Restrictions Weight Bearing Restrictions: No Pain: Pain Assessment Pain Scale: 0-10 Pain Score: 6  Pain Type: Acute pain Pain Location: Leg Pain Orientation: Left Pain Descriptors / Indicators: Aching Pain Onset:  On-going Patients Stated Pain Goal: 3 Pain Intervention(s): Medication (See eMAR) Multiple Pain Sites: No    [name] 02/25/2019, 2:47 PM

## 2019-02-26 ENCOUNTER — Inpatient Hospital Stay (HOSPITAL_COMMUNITY): Payer: Self-pay | Admitting: Occupational Therapy

## 2019-02-26 ENCOUNTER — Inpatient Hospital Stay (HOSPITAL_COMMUNITY): Payer: Self-pay | Admitting: Physical Therapy

## 2019-02-26 ENCOUNTER — Inpatient Hospital Stay (HOSPITAL_COMMUNITY): Payer: Self-pay

## 2019-02-26 NOTE — Progress Notes (Signed)
[place] PHYSICAL MEDICINE & REHABILITATION PROGRESS NOTE  Subjective/Complaints:  Had a fair night. Some burning pain in her left leg this morning. Occasional left sided chest pain too yesterday  ROS: Patient denies fever, rash, sore throat, blurred vision, nausea, vomiting, diarrhea, cough, shortness of breath or chest pain,  headache, or mood change.    Objective: Vital Signs: Blood pressure 129/84, pulse 93, temperature 97.8 F (36.6 C), resp. rate 19, height 5\' 6"  (1.676 m), SpO2 100 %. No results found. No results for input(s): WBC, HGB, HCT, PLT in the last 72 hours. No results for input(s): NA, K, CL, CO2, GLUCOSE, BUN, CREATININE, CALCIUM in the last 72 hours.  Physical Exam: BP 129/84 (BP Location: Right Wrist)   Pulse 93   Temp 97.8 F (36.6 C)   Resp 19   Ht 5\' 6"  (1.676 m)   SpO2 100%   BMI 64.76 kg/m  Constitutional: No distress . Vital signs reviewed. HEENT: EOMI, oral membranes moist Neck: supple Cardiovascular: RRR without murmur. No JVD    Respiratory: CTA Bilaterally without wheezes or rales. Normal effort    GI: BS +, non-tender, non-distended  Musc: mild tenderness left knee/leg, LB tender Neurological: She isalert and oriented. Motor: Bilateral UE 5/5.  RLE HF, KE 2/5, 3/5 ADF---  LLE: 1/5 flexion and extension left foot--1/5 KE Senses pain and gross touch in both legs--sensory stable Skin: Skin iswarm. Back  Incision healed  Psychiatric: pleasant   Assessment/Plan: 1. 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  Physiatrist is providing close team supervision and 24 hour management of active medical problems listed below.  Physiatrist and rehab team continue to assess barriers to discharge/monitor patient progress toward functional and medical goals  Care Tool:  Bathing    Body parts bathed by  patient: Right arm, Left arm, Chest, Abdomen, Face   Body parts bathed by helper: Front perineal area, Buttocks, Right upper leg, Left upper leg, Right lower leg, Left lower leg     Bathing assist Assist Level: 2 Helpers     Upper Body Dressing/Undressing Upper body dressing   What is the patient wearing?: Pull over shirt    Upper body assist Assist Level: Minimal Assistance - Patient > 75%    Lower Body Dressing/Undressing Lower body dressing      What is the patient wearing?: Pants, Ace wrap/stump shrinker     Lower body assist Assist for lower body dressing: Total Assistance - Patient < 25%     Toileting Toileting    Toileting assist Assist for toileting: 2 Helpers     Transfers Chair/bed transfer  Transfers assist     Chair/bed transfer assist level: Dependent - mechanical lift     Locomotion Ambulation   Ambulation assist   Ambulation activity did not occur: Safety/medical concerns          Walk 10 feet activity   Assist  Walk 10 feet activity did not occur: Safety/medical concerns        Walk 50 feet activity   Assist Walk 50 feet with 2 turns activity did not occur: Safety/medical concerns         Walk 150 feet activity   Assist Walk 150 feet activity did not occur: Safety/medical concerns         Walk 10 feet on uneven surface  activity   Assist Walk 10 feet on uneven surfaces activity  did not occur: Safety/medical concerns         Wheelchair     Assist Will patient use wheelchair at discharge?: Yes Type of Wheelchair: Manual Wheelchair activity did not occur: Safety/medical concerns  Wheelchair assist level: Supervision/Verbal cueing Max wheelchair distance: 176ft    Wheelchair 50 feet with 2 turns activity    Assist    Wheelchair 50 feet with 2 turns activity did not occur: Safety/medical concerns   Assist Level: Supervision/Verbal cueing   Wheelchair 150 feet activity     Assist Wheelchair  150 feet activity did not occur: Safety/medical concerns   Assist Level: Supervision/Verbal cueing      Medical Problem List and Plan: 1.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 No back brace required  --Continue CIR therapies including PT, OT     Slow improvement motor strength in LEs, still requiring Maxi sky lift PRAFO LLE at HS  -SNF pending 2. DVT Prophylaxis/Anticoagulation: SCDs.   Vascular study limited, but negative 3. Pain Management:Neurontin 300 mg 3 times a day---increased to 400mg  tid with benefit  - hydrocodone as needed  -prn baclofen  Patellofemoral pain: voltaren gel helpful 4. Mood:Provide emotional support 5. Neuropsych: This patientiscapable of making decisions on herown behalf. 6. Skin/Wound Care:Routine skin checks 7. Fluids/Electrolytes/Nutrition:Routine ins and outs  Push PO, check labs Monday  8.Proteus/Escherichia coli UTI. Recurrent Neurogenic bladder Bactrim completed 2/12, recurrent symptoms of dysuria, + UA, Cx proteus based on  C and S    -keflex completed 9.  Super obesity. Latest BMI 64.79. Dietary follow-up  - still some behavioral/diet choice issues going on here 10. Neurogenic bowel and bladder. Pt with GI discomfort today of unclear etiology  Flomax started on 2/8, peri-care ongoing   -continue senna-s at night  -hold miralax for now due to upset, soft stools  -changed to QOD fleet enema--no BM this morning   -repeat program tomorrow. She may need to go to daily program. I discussed that with her today 12.?  Anemia:   -repeat CBC, improved Hgb to 10.7 --recheck monday 13. Insomnia: scheduled trazodone      LOS: 28 days A FACE TO FACE EVALUATION WAS PERFORMED  [name] 02/26/2019, 10:22 AM

## 2019-02-26 NOTE — Progress Notes (Signed)
Occupational Therapy Session Note  Patient Details  Name: Ellen Hill MRN: [id number] Date of Birth: Jan 11, 1971  Today's Date: 02/26/2019 OT Individual Time: 4098-1191 OT Individual Time Calculation (min): 73 min   Short Term Goals: Week 3:  OT Short Term Goal 1 (Week 3): STGs=LTGs due to ELOS  Skilled Therapeutic Interventions/Progress Updates:    Pt greeted in bed, already dressed but without ACE wraps. Total A for donning ACE wraps and sneakers bedlevel. Pt rolling with Max A for Maxi sling placement and transferred to w/c using lift equipment. She self propelled w/c to sink and worked on weight shifting forward at sink during oral care, handwashing, and other grooming tasks. Pt able to expectorate into sink with steady assist. Next she self propelled to dayroom, weaving around OT as an obstacle to improve w/c proficiency. Once in dayroom, focused on LE weightbearing on step, core activation, and UB strengthening during guided w/c push ups. Pt unable to clear buttocks off of w/c, but reported ability to relieve pressure from buttocks. She completed several sets, 3-5 reps. Advised her to complete modified w/c push ups in room (with leg rests on chair) for continued pressure relief and strengthening outside of tx. Pt verbalized understanding. At end of session she was taken back to room and left with all needs within reach.   Also modified w/c brakes to increase ease of locking/unlocking, with pt reporting helpfulness.   Therapy Documentation Precautions:  Precautions Precautions: Back, Fall Precaution Comments: obese. No brace needed Restrictions Weight Bearing Restrictions: No Pain: RN in to provide pain medication during session  Pain Assessment Pain Scale: 0-10 Pain Score: 6  Pain Location: Leg Pain Orientation: Left Pain Descriptors / Indicators: Aching Patients Stated Pain Goal: 3 Pain Intervention(s): Medication (See eMAR) ADL:       Therapy/Group: Individual  Therapy  Kyan Yurkovich A Lajada Janes 02/26/2019, 12:38 PM

## 2019-02-26 NOTE — Progress Notes (Signed)
Physical Therapy Session Note  Patient Details  Name: Ellen Hill MRN: [id number] Date of Birth: Apr 06, 1971  Today's Date: 02/26/2019 PT Individual Time: 1300-1415 PT Individual Time Calculation (min): 75 min   Short Term Goals: Week 4:  PT Short Term Goal 1 (Week 4): =LTG due to ELOS  Skilled Therapeutic Interventions/Progress Updates:    Pt received seated in w/c in room, agreeable to PT session. No complaints of pain at rest. Pt reports urge to urinate. Maxisky transfer w/c to bed. Rolling L/R with mod A and use of bedrails for removal of clothing. Dependent for placement of female urinal, pt is able to continently void into urinal. Pt reports burning sensation with urination, RN notified of burning as well as frequency. Maxisky transfer to EOB. Sitting EOB balance with Supervision. Dependent for placement of slide board under L hip. Attempt to perform slide board transfer to the L into w/c. Pt is able to perform several scoots to the L with mod A for BLE management during transfer. Pt becomes very anxious during transfer and reports onset of pain in buttocks where she is seated on slide board. Pt reports urge to urinate again. Pt requires total A x 3 to safely return from sitting EOB on slide board to supine position. Dependent for clothing management and placement of urinal. Rolling L/R with mod A and use of bedrails for dependent pericare and brief change. Pt left seated in bed with needs in reach.  Therapy Documentation Precautions:  Precautions Precautions: Back, Fall Precaution Comments: obese. No brace needed Restrictions Weight Bearing Restrictions: No    Therapy/Group: Individual Therapy   Excell Seltzer, PT, DPT  02/26/2019, 3:30 PM

## 2019-02-26 NOTE — Progress Notes (Signed)
Occupational Therapy Session Note  Patient Details  Name: Kameren Baade MRN: [id number] Date of Birth: 01/16/1971  Today's Date: 02/26/2019 OT Individual Time: 1530-1630 OT Individual Time Calculation (min): 60 min    Short Term Goals: Week 4:  OT Short Term Goal 1 (Week 4): STGs=LTGs secondary to upcoming discharge  Skilled Therapeutic Interventions/Progress Updates:    Session focused on bed level B LE PROM and B UE AROM/resistive training. Pt with no c/o pain, except tightness during PROM which was alleviated with stretch released. Pt was guided through B LE PROM including hip and knee flexion, hip IR/ER, and foot dorsi/plantar flexion. Pt then held 4 lb dowel with bilateral resistant bands and completed B UE circuit with a focus on triceps, shoulder flex/ext, and lats. Pt left supine with all needs met.   Therapy Documentation Precautions:  Precautions Precautions: Back, Fall Precaution Comments: obese. No brace needed Restrictions Weight Bearing Restrictions: No    Therapy/Group: Individual Therapy  Curtis Sites 02/26/2019, 1:01 PM

## 2019-02-26 NOTE — Progress Notes (Signed)
Pt reported burning with urination and increased frequency of urination with tea colored fluid noted. Alcide Goodness [facility] Notified of return in urinary symptoms and completed keflex 02/24/2019. Pt has been drinking more cranberry jiuce and water with out relief.  Margarito Liner

## 2019-02-27 ENCOUNTER — Inpatient Hospital Stay (HOSPITAL_COMMUNITY): Payer: Self-pay | Admitting: Occupational Therapy

## 2019-02-27 DIAGNOSIS — E669 Obesity, unspecified: Secondary | ICD-10-CM

## 2019-02-27 NOTE — Progress Notes (Signed)
[facility] PHYSICAL MEDICINE & REHABILITATION PROGRESS NOTE  Subjective/Complaints: Patient seen laying in bed this morning.  She states she slept on and off overnight secondary to having to have a bowel movement, which she states she eventually succeeded at.  ROS: Denies CP, SOB, N/V/D  Objective: Vital Signs: Blood pressure 115/78, pulse 96, temperature 97.9 F (36.6 C), resp. rate 18, height 5\' 6"  (1.676 m), weight (!) 174 kg, SpO2 100 %. No results found. No results for input(s): WBC, HGB, HCT, PLT in the last 72 hours. No results for input(s): NA, K, CL, CO2, GLUCOSE, BUN, CREATININE, CALCIUM in the last 72 hours.  Physical Exam: BP 115/78 (BP Location: Right Arm)   Pulse 96   Temp 97.9 F (36.6 C)   Resp 18   Ht 5\' 6"  (1.676 m)   Wt (!) 174 kg   SpO2 100%   BMI 61.90 kg/m  Constitutional: No distress . Vital signs reviewed. HENT: Normocephalic.  Atraumatic. Eyes: EOMI. No discharge. Cardiovascular: RRR. No JVD. Respiratory: CTA Bilaterally. Normal effort. GI: BS +. Non-distended. Musc: No edema or tenderness in extremities. Neurological: She isalert and oriented. Motor: Bilateral UE 5/5.  RLE HF, KE 2/5, 3/5 ADF LLE: 1/5 hip flexion and knee extension, wiggles toes Skin: Skin iswarm. Back  Incision healed  Psychiatric: pleasant   Assessment/Plan: 1. 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  Physiatrist is providing close team supervision and 24 hour management of active medical problems listed below.  Physiatrist and rehab team continue to assess barriers to discharge/monitor patient progress toward functional and medical goals  Care Tool:  Bathing    Body parts bathed by patient: Right arm, Left arm, Chest, Abdomen, Face   Body parts bathed by helper: Front perineal area, Buttocks, Right upper leg, Left upper leg,  Right lower leg, Left lower leg     Bathing assist Assist Level: 2 Helpers     Upper Body Dressing/Undressing Upper body dressing   What is the patient wearing?: Pull over shirt    Upper body assist Assist Level: Minimal Assistance - Patient > 75%    Lower Body Dressing/Undressing Lower body dressing      What is the patient wearing?: Pants, Ace wrap/stump shrinker     Lower body assist Assist for lower body dressing: Total Assistance - Patient < 25%     Toileting Toileting    Toileting assist Assist for toileting: 2 Helpers     Transfers Chair/bed transfer  Transfers assist     Chair/bed transfer assist level: Dependent - mechanical lift     Locomotion Ambulation   Ambulation assist   Ambulation activity did not occur: Safety/medical concerns          Walk 10 feet activity   Assist  Walk 10 feet activity did not occur: Safety/medical concerns        Walk 50 feet activity   Assist Walk 50 feet with 2 turns activity did not occur: Safety/medical concerns         Walk 150 feet activity   Assist Walk 150 feet activity did not occur: Safety/medical concerns         Walk 10 feet on uneven surface  activity   Assist Walk 10 feet on uneven surfaces activity did not occur: Safety/medical concerns         Wheelchair     Assist Will patient use wheelchair  at discharge?: Yes Type of Wheelchair: Manual Wheelchair activity did not occur: Safety/medical concerns  Wheelchair assist level: Supervision/Verbal cueing Max wheelchair distance: 150ft    Wheelchair 50 feet with 2 turns activity    Assist    Wheelchair 50 feet with 2 turns activity did not occur: Safety/medical concerns   Assist Level: Supervision/Verbal cueing   Wheelchair 150 feet activity     Assist Wheelchair 150 feet activity did not occur: Safety/medical concerns   Assist Level: Supervision/Verbal cueing      Medical Problem List and  Plan: 1.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 No back brace required  Continue CIR     PRAFO LLE at HS  -SNF pending  Notes reviewed-slow progress 2. DVT Prophylaxis/Anticoagulation: SCDs.   Vascular study limited, but negative 3. Pain Management:Neurontin 300 mg 3 times a day---increased to 400mg  tid with benefit  - hydrocodone as needed  -prn baclofen   Patellofemoral pain: voltaren gel helpful  Controlled on 3/7 4. Mood:Provide emotional support 5. Neuropsych: This patientiscapable of making decisions on herown behalf. 6. Skin/Wound Care:Routine skin checks 7. Fluids/Electrolytes/Nutrition:Routine ins and outs  Labs ordered for Monday 8.Proteus/Escherichia coli UTI. Recurrent Neurogenic bladder Bactrim completed 2/12, recurrent symptoms of dysuria, + UA, Cx proteus based on  C and S    -keflex completed 9.  Super obesity. Latest BMI 64.79. Dietary follow-up  - still some behavioral/diet choice issues going on here 10. Neurogenic bowel and bladder. Pt with GI discomfort today of unclear etiology  Flomax started on 2/8, peri-care ongoing   -continue senna-s at night  -hold miralax for now due to upset, soft stools  -changed to QOD fleet enema  Improving 12.?  Anemia of chronic disease:   Hgb to 10.7 on 2/25  Labs ordered for Monday 13. Insomnia: scheduled trazodone      LOS: 29 days A FACE TO FACE EVALUATION WAS PERFORMED  Keshana Klemz Lorie Phenix 02/27/2019, 9:06 AM

## 2019-02-27 NOTE — Progress Notes (Signed)
Occupational Therapy Session Note  Patient Details  Name: Ellen Hill MRN: [id number] Date of Birth: 1971/07/01  Today's Date: 02/27/2019 OT Individual Time: 9371-6967 and 1300-1416 OT Individual Time Calculation (min): 57 min and 76 min  Short Term Goals: Week 3:  OT Short Term Goal 1 (Week 3): STGs=LTGs due to ELOS  Skilled Therapeutic Interventions/Progress Updates:    Pt greeted in bed and premedicated for pain. Wanting to get dressed. Started tx with ACE wrapping B LEs Total A. 2 helpers for supine<sit to proceed with self care EOB to work on static and dynamic sitting balance. Used a step for foot support. Pt able to maintain balance with close supervision assist while engaging B UEs in various tasks including deoderent application, lotion application, and donning overhead shirt. When using reacher to thread LEs into pants EOB, pt required unilateral UE support on bed and manual assist with LE placement/mgt. Pt had small LOBs backwards and towards Rt during this time, however able to self correct with steady assist. LB weightbearing/strengthening with having pt push down through LEs to don sneakers. She returned to supine with 2 assist to elevate pants over hips, rolling Rt>Lt with Max A and pt actively assisting using UB. Maxi sky transfer completed to w/c and she completed oral care and grooming tasks at sink with setup. With UE support, able to weight shift forward to gather necessary items and meet demands of stated tasks unassisted. At end of session pt was left with sister and setup to don clean pillowcases on several of her pillows. Discussed with sister and pt importance of continued therapeutic activity outside of tx for UE strengthening and activity tolerance. They verbalized understanding. Pt left in w/c with all needs within reach.    2nd session 1:1 tx (76 min) Pt greeted in w/c without c/o pain. Escorted pt to atrium to work on UE and core strengthening in a stimulating  community environment. Pt self propelled throughout the atrium area, navigating around obstacles with min-mod vcs for technique. She also self propelled in gift shop when OT repositioned furniture pieces. Anterior reaching completed several times with supervision to hold stuffed animals, listen to stuffed animals play music, and to look at bags and jewelry. Pts affect brightened when sitting in sunlight and dancing along to familiar music. 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 At end of session pt was returned to room, thanking OT for opportunity to leave unit for tx. She was left with RN and RT for transferring back to bed.    Therapy Documentation Precautions:  Precautions Precautions: Back, Fall Precaution Comments: obese. No brace needed Restrictions Weight Bearing Restrictions: No Pain: Pain Assessment Pain Scale: 0-10 Pain Score: 6  Faces Pain Scale: Hurts even more ADL:       Therapy/Group: Individual Therapy  Alyna Stensland A Regis Hinton 02/27/2019, 12:23 PM

## 2019-02-28 ENCOUNTER — Inpatient Hospital Stay (HOSPITAL_COMMUNITY): Payer: Self-pay | Admitting: Occupational Therapy

## 2019-02-28 DIAGNOSIS — R300 Dysuria: Secondary | ICD-10-CM

## 2019-02-28 NOTE — Progress Notes (Signed)
[facility] PHYSICAL MEDICINE & REHABILITATION PROGRESS NOTE  Subjective/Complaints: Patient seen laying in bed this morning.  Ellen Hill states Ellen Hill slept well overnight.  Ellen Hill complains of burning with urination.  ROS: + Dysuria.  Denies CP, SOB, N/V/D  Objective: Vital Signs: Blood pressure 121/66, pulse (!) 104, temperature 98.5 F (36.9 C), temperature source Oral, resp. rate 16, height 5\' 6"  (1.676 m), weight (!) 174 kg, SpO2 99 %. No results found. No results for input(s): WBC, HGB, HCT, PLT in the last 72 hours. No results for input(s): NA, K, CL, CO2, GLUCOSE, BUN, CREATININE, CALCIUM in the last 72 hours.  Physical Exam: BP 121/66 (BP Location: Right Wrist)   Pulse (!) 104   Temp 98.5 F (36.9 C) (Oral)   Resp 16   Ht 5\' 6"  (1.676 m)   Wt (!) 174 kg   SpO2 99%   BMI 61.90 kg/m  Constitutional: No distress . Vital signs reviewed. HENT: Normocephalic.  Atraumatic. Eyes: EOMI. No discharge. Cardiovascular: RRR.  No JVD. Respiratory: CTA bilaterally.  Normal effort. GI: BS +. Non-distended. Musc: No edema or tenderness in extremities. Neurological: Ellen Hill isalert and oriented. Motor:   RLE HF, KE 2/5, 3/5 ADF, stable LLE: 1/5 hip flexion and knee extension, wiggles toes, stable Skin: Skin iswarm. Back incision healed, not examined today Psychiatric: pleasant   Assessment/Plan: 1. 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  Physiatrist is providing close team supervision and 24 hour management of active medical problems listed below.  Physiatrist and rehab team continue to assess barriers to discharge/monitor patient progress toward functional and medical goals  Care Tool:  Bathing    Body parts bathed by patient: Right arm, Left arm, Chest, Abdomen, Face   Body parts bathed by helper: Front perineal area, Buttocks, Right upper  leg, Left upper leg, Right lower leg, Left lower leg     Bathing assist Assist Level: 2 Helpers     Upper Body Dressing/Undressing Upper body dressing   What is the patient wearing?: Pull over shirt    Upper body assist Assist Level: Minimal Assistance - Patient > 75%    Lower Body Dressing/Undressing Lower body dressing      What is the patient wearing?: Pants, Ace wrap/stump shrinker     Lower body assist Assist for lower body dressing: 2 Helpers     Toileting Toileting    Toileting assist Assist for toileting: 2 Helpers     Transfers Chair/bed transfer  Transfers assist     Chair/bed transfer assist level: Dependent - mechanical lift     Locomotion Ambulation   Ambulation assist   Ambulation activity did not occur: Safety/medical concerns          Walk 10 feet activity   Assist  Walk 10 feet activity did not occur: Safety/medical concerns        Walk 50 feet activity   Assist Walk 50 feet with 2 turns activity did not occur: Safety/medical concerns         Walk 150 feet activity   Assist Walk 150 feet activity did not occur: Safety/medical concerns         Walk 10 feet on uneven surface  activity   Assist Walk 10 feet on uneven surfaces activity did not occur: Safety/medical concerns         Wheelchair     Assist Will patient use wheelchair at discharge?:  Yes Type of Wheelchair: Manual Wheelchair activity did not occur: Safety/medical concerns  Wheelchair assist level: Supervision/Verbal cueing Max wheelchair distance: 118ft    Wheelchair 50 feet with 2 turns activity    Assist    Wheelchair 50 feet with 2 turns activity did not occur: Safety/medical concerns   Assist Level: Supervision/Verbal cueing   Wheelchair 150 feet activity     Assist Wheelchair 150 feet activity did not occur: Safety/medical concerns   Assist Level: Supervision/Verbal cueing      Medical Problem List and  Plan: 1.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 No back brace required  Continue CIR     PRAFO LLE at HS  -SNF pending 2. DVT Prophylaxis/Anticoagulation: SCDs.   Vascular study limited, but negative 3. Pain Management:Neurontin 300 mg 3 times a day---increased to 400mg  tid with benefit  - hydrocodone as needed  -prn baclofen   Patellofemoral pain: voltaren gel helpful  Controlled on 3/7 4. Mood:Provide emotional support 5. Neuropsych: This patientiscapable of making decisions on herown behalf. 6. Skin/Wound Care:Routine skin checks 7. Fluids/Electrolytes/Nutrition:Routine ins and outs  Labs ordered for tomorrow 8.Proteus/Escherichia coli UTI. Recurrent Neurogenic bladder Bactrim completed 2/12, recurrent symptoms of dysuria, + UA, Cx proteus based on  C and S    -keflex completed 9.  Super obesity. Latest BMI 64.79. Dietary follow-up  - still some behavioral/diet choice issues going on here 10. Neurogenic bowel and bladder. Pt with GI discomfort today of unclear etiology  Flomax started on 2/8, peri-care ongoing   -continue senna-s at night  -hold miralax for now   -changed to QOD fleet enema  Improving 12.?  Anemia of chronic disease:   Hgb to 10.7 on 2/25  Labs ordered for tomorrow 13.  Sleep disturbance: scheduled trazodone 14. Dysuria  UA/urine culture ordered      LOS: 30 days A FACE TO FACE EVALUATION WAS PERFORMED  Ankit Lorie Phenix 02/28/2019, 8:04 AM

## 2019-02-28 NOTE — Progress Notes (Signed)
Patient is having episodes of incontinent bowels with loose stools. Patient states she is unable to feel when this is happening. Patient also states she cannot feel an urge to empty her bowel only her bladder. Patient has no complaints of nausea or pain. No fever. Doy Hutching, LPN

## 2019-02-28 NOTE — Progress Notes (Signed)
Occupational Therapy Session Note  Patient Details  Name: Ellen Hill MRN: [id number] Date of Birth: [birth year]  Today's Date: 02/28/2019 OT Group Time: 1100-1200 OT Group Time Calculation (min): 60 min  Skilled Therapeutic Interventions/Progress Updates:    Pt engaged in therapeutic w/c level dance group focusing on patient choice, UE/LE strengthening, salience, activity tolerance, and social participation. Pt was guided through various dance-based exercises involving UEs/LEs and trunk. All music was selected by group members. Emphasis placed on general strengthening and endurance. Pt actively participated throughout group, incorporating shoulder and neck movement during UE dance exercises and singing along to familiar music. Had her shake hips for core strengthening. Gentle PROM completed for B LEs and encouraged pt to wriggle toes for NMR. She often smiled and requested music as well. At end of session pt was returned to room by RN.    Therapy Documentation Precautions:  Precautions Precautions: Back, Fall Precaution Comments: obese. No brace needed Restrictions Weight Bearing Restrictions: No Pain: No s/s pain during tx    ADL:       Therapy/Group: Group Therapy  Bernarda Erck A Ysidra Sopher 02/28/2019, 12:45 PM

## 2019-03-01 ENCOUNTER — Inpatient Hospital Stay (HOSPITAL_COMMUNITY): Payer: Self-pay | Admitting: Occupational Therapy

## 2019-03-01 ENCOUNTER — Inpatient Hospital Stay (HOSPITAL_COMMUNITY): Payer: Self-pay | Admitting: Physical Therapy

## 2019-03-01 LAB — CBC
HCT: 34.8 % — ABNORMAL LOW (ref 36.0–46.0)
HEMOGLOBIN: 9.9 g/dL — AB (ref 12.0–15.0)
MCH: 23.1 pg — ABNORMAL LOW (ref 26.0–34.0)
MCHC: 28.4 g/dL — ABNORMAL LOW (ref 30.0–36.0)
MCV: 81.3 fL (ref 80.0–100.0)
Platelets: 211 10*3/uL (ref 150–400)
RBC: 4.28 MIL/uL (ref 3.87–5.11)
RDW: 18 % — ABNORMAL HIGH (ref 11.5–15.5)
WBC: 8.9 10*3/uL (ref 4.0–10.5)
nRBC: 0 % (ref 0.0–0.2)

## 2019-03-01 LAB — URINALYSIS, COMPLETE (UACMP) WITH MICROSCOPIC
Bilirubin Urine: NEGATIVE
Glucose, UA: NEGATIVE mg/dL
Ketones, ur: NEGATIVE mg/dL
NITRITE: NEGATIVE
Protein, ur: 100 mg/dL — AB
RBC / HPF: >50 RBC/hpf — ABNORMAL HIGH (ref 0–5)
Specific Gravity, Urine: 1.023 (ref 1.005–1.030)
WBC, UA: >50 WBC/hpf — ABNORMAL HIGH (ref 0–5)
pH: 8 (ref 5.0–8.0)

## 2019-03-01 LAB — BASIC METABOLIC PANEL
Anion gap: 11 (ref 5–15)
BUN: 21 mg/dL — ABNORMAL HIGH (ref 6–20)
CO2: 22 mmol/L (ref 22–32)
Calcium: 8.7 mg/dL — ABNORMAL LOW (ref 8.9–10.3)
Chloride: 102 mmol/L (ref 98–111)
Creatinine, Ser: 0.64 mg/dL (ref 0.44–1.00)
GFR calc non Af Amer: >60 mL/min (ref >60)
Glucose, Bld: 98 mg/dL (ref 70–99)
POTASSIUM: 3.8 mmol/L (ref 3.5–5.1)
Sodium: 135 mmol/L (ref 135–145)

## 2019-03-01 NOTE — Progress Notes (Signed)
Occupational Therapy Session Note  Patient Details  Name: Ellen Hill MRN: [id number] Date of Birth: 07/31/1971  Today's Date: 03/01/2019 OT Individual Time: 0930-1042 OT Individual Time Calculation (min): 72 min   Short Term Goals: Week 3:  OT Short Term Goal 1 (Week 3): STGs=LTGs due to ELOS  Skilled Therapeutic Interventions/Progress Updates:    Pt greeted in w/c, premedicated for pain and ready to go. ADL needs met. Took pt to [place] hallway and initially worked on LE weightbearing, anterior weight shifting in prep for slideboard transfers, and core strengthening. Used elevated flooring near window to serve as step for B LEs. With vcs, pt able to symmetrically activate trunk when pushing herself forward to touch sunny window. Pt able to sustain anterior weight shift for 5-10 second holds with hands pressed on window, 10 reps. W/c push ups completed afterwards with vcs for core activation x10 reps. Though pt is unable to clear buttocks from chair, pt reported being able to relieve pressure from buttocks. Afterwards to work on UB strengthening pt self propelled up small ramp 155 ft x2. Pt was then returned to room via w/c and left with all needs within reach and sister present.   Therapy Documentation Precautions:  Precautions Precautions: Back, Fall Precaution Comments: obese. No brace needed Restrictions Weight Bearing Restrictions: No Pain: in L LE when repositioned. Subsided when placed on step.    ADL:       Therapy/Group: Individual Therapy   A  03/01/2019, 12:53 PM  

## 2019-03-01 NOTE — Progress Notes (Signed)
[facility] PHYSICAL MEDICINE & REHABILITATION PROGRESS NOTE  Subjective/Complaints: Having incontinent stools. Bowel program still qod. Denies any new problems however. Intermittent back and leg pain  ROS: Patient denies fever, rash, sore throat, blurred vision, nausea, vomiting, diarrhea, cough, shortness of breath or chest pain,  headache, or mood change.    Objective: Vital Signs: Blood pressure 137/74, pulse 93, temperature 98.3 F (36.8 C), temperature source Oral, resp. rate 18, height 5\' 6"  (1.676 m), weight (!) 174 kg, SpO2 100 %. No results found. Recent Labs    03/01/19 0612  WBC 8.9  HGB 9.9*  HCT 34.8*  PLT 211   Recent Labs    03/01/19 0612  NA 135  K 3.8  CL 102  CO2 22  GLUCOSE 98  BUN 21*  CREATININE 0.64  CALCIUM 8.7*    Physical Exam: BP 137/74 (BP Location: Right Arm)   Pulse 93   Temp 98.3 F (36.8 C) (Oral)   Resp 18   Ht 5\' 6"  (1.676 m)   Wt (!) 174 kg   SpO2 100%   BMI 61.90 kg/m  General: No acute distress, obese HEENT: EOMI, oral membranes moist Cards: reg rate  Chest: normal effort Abdomen: Soft, NT, ND Skin: dry, intact Extremities: no edema Musc: No edema or tenderness in extremities. Neurological: She isalert and oriented. Motor:   RLE HF, KE 2/5, 3/5 ADF, stable LLE: 1/5 hip flexion and knee extension, wiggles toes, stable exam.  Skin: warm Psychiatric: pleasant   Assessment/Plan: 1. 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  Physiatrist is providing close team supervision and 24 hour management of active medical problems listed below.  Physiatrist and rehab team continue to assess barriers to discharge/monitor patient progress toward functional and medical goals  Care Tool:  Bathing    Body parts bathed by patient: Right arm, Left arm, Chest, Abdomen, Face   Body parts  bathed by helper: Front perineal area, Buttocks, Right upper leg, Left upper leg, Right lower leg, Left lower leg     Bathing assist Assist Level: 2 Helpers     Upper Body Dressing/Undressing Upper body dressing   What is the patient wearing?: Pull over shirt    Upper body assist Assist Level: Minimal Assistance - Patient > 75%    Lower Body Dressing/Undressing Lower body dressing      What is the patient wearing?: Pants, Ace wrap/stump shrinker     Lower body assist Assist for lower body dressing: 2 Helpers     Toileting Toileting    Toileting assist Assist for toileting: 2 Helpers     Transfers Chair/bed transfer  Transfers assist     Chair/bed transfer assist level: Dependent - mechanical lift     Locomotion Ambulation   Ambulation assist   Ambulation activity did not occur: Safety/medical concerns          Walk 10 feet activity   Assist  Walk 10 feet activity did not occur: Safety/medical concerns        Walk 50 feet activity   Assist Walk 50 feet with 2 turns activity did not occur: Safety/medical concerns         Walk 150 feet activity   Assist Walk 150 feet activity did not occur: Safety/medical concerns         Walk 10 feet on uneven surface  activity   Assist Walk 10 feet on  uneven surfaces activity did not occur: Safety/medical concerns         Wheelchair     Assist Will patient use wheelchair at discharge?: Yes Type of Wheelchair: Manual Wheelchair activity did not occur: Safety/medical concerns  Wheelchair assist level: Supervision/Verbal cueing Max wheelchair distance: 144ft    Wheelchair 50 feet with 2 turns activity    Assist    Wheelchair 50 feet with 2 turns activity did not occur: Safety/medical concerns   Assist Level: Supervision/Verbal cueing   Wheelchair 150 feet activity     Assist Wheelchair 150 feet activity did not occur: Safety/medical concerns   Assist Level:  Supervision/Verbal cueing      Medical Problem List and Plan: 1.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 No back brace required  Continue CIR   PT, OT   PRAFO LLE at HS  -SNF pending 2. DVT Prophylaxis/Anticoagulation: SCDs.   Vascular study limited, but negative 3. Pain Management:Neurontin 300 mg 3 times a day---increased to 400mg  tid with benefit  - hydrocodone as needed  -prn baclofen   Patellofemoral pain: voltaren gel helpful  Controlled on 3/9 4. Mood:Provide emotional support 5. Neuropsych: This patientiscapable of making decisions on herown behalf. 6. Skin/Wound Care:Routine skin checks 7. Fluids/Electrolytes/Nutrition:Routine ins and outs  I personally reviewed the patient's labs today.  wnl 8.Proteus/Escherichia coli UTI. Recurrent Neurogenic bladder Bactrim completed 2/12, recurrent symptoms of dysuria, + UA, Cx proteus based on  C and S    -keflex completed 9.  Super obesity. Latest BMI 64.79. Dietary follow-up  - still some behavioral/diet choice issues going on here 10. Neurogenic bowel and bladder. Pt with GI discomfort today of unclear etiology  Flomax started on 2/8, peri-care ongoing   -continue senna-s at night  -hold miralax for now   -changed to QOD fleet enema  Now off track again since we switched to QOD program. Discussed patient 12.?  Anemia of chronic disease:   Hgb to 9.9 13.  Sleep disturbance: scheduled trazodone 14. Dysuria  UA equivocal, ucx pending      LOS: 31 days A FACE TO FACE EVALUATION WAS PERFORMED  [name] 03/01/2019, 1:18 PM

## 2019-03-01 NOTE — Progress Notes (Signed)
Physical Therapy Weekly Progress Note  Patient Details  Name: Ellen Hill MRN: [id number] Date of Birth: 1971-02-02  Beginning of progress report period: February 22, 2019 End of progress report period: March 01, 2019  Today's Date: 03/01/2019 PT Individual Time: 1540-0867; 1345-1430 PT Individual Time Calculation (min): 75 min and 45 min  Patient has met 0 of 1 short term goals.  No short term goals were set due to estimated d/c this week, STG = LTG, however patient is expecting to d/c to SNF once she has been accepted into a facility so she is continuing with therapy while awaiting placement.  Patient continues to demonstrate the following deficits muscle weakness and muscle joint tightness, abnormal tone and unbalanced muscle activation and decreased sitting balance, decreased postural control and decreased balance strategies and therefore will continue to benefit from skilled PT intervention to increase functional independence with mobility.  Patient progressing toward long term goals..  Continue plan of care.  PT Short Term Goals Week 4:  PT Short Term Goal 1 (Week 4): =LTG due to ELOS PT Short Term Goal 1 - Progress (Week 4): Progressing toward goal Week 5:  PT Short Term Goal 1 (Week 5): =LTG due to ELOS  Skilled Therapeutic Interventions/Progress Updates:    Session 1: Pt received seated in bed, agreeable to PT session. No complaints of pain. Pt reports ongoing loose stools this AM. Dependent to don BLE ACE wrap, provided handout for how to wrap for patient and family reference. Supine BLE PROM in all available planes of motion, pt exhibits improved tolerance for PROM this date. Rolling L/R with mod A and use of bedrails for dependent donning of pants and placement of maxisky sling. Maxisky transfer bed to w/c. Pt is min A to don shirt while seated in w/c, setup A to brush teeth at sink. Manual w/c propulsion x 100 ft with use of BUE and Supervision. Pt left seated in w/c in room  with needs in reach.  Session 2: Pt received seated in bed, agreeable to PT session. Pt reports 7/10 pain in L knee, receives pain medication from RN. Rolling L/R with mod A to place maxisky sling. Maxisky transfer bed to w/c. Manual w/c propulsion x 100 ft with use of BUE and Supervision. Session focus on core muscle activation while reaching outside BOS and leaning forwards playing Connect 4 game while seated in w/c with 4" step under BLE for [place] and support. Pt left seated in w/c in room with needs in reach at end of session.  Therapy Documentation Precautions:  Precautions Precautions: Back, Fall Precaution Comments: obese. No brace needed Restrictions Weight Bearing Restrictions: No   Therapy/Group: Individual Therapy   Excell Seltzer, PT, DPT 03/01/2019, 12:12 PM

## 2019-03-02 ENCOUNTER — Inpatient Hospital Stay (HOSPITAL_COMMUNITY): Payer: Self-pay | Admitting: Physical Therapy

## 2019-03-02 ENCOUNTER — Inpatient Hospital Stay (HOSPITAL_COMMUNITY): Payer: Self-pay

## 2019-03-02 MED ORDER — AMOXICILLIN 250 MG PO CAPS
250.0000 mg | ORAL_CAPSULE | Freq: Three times a day (TID) | ORAL | Status: DC
  Administered 2019-03-02 – 2019-03-05 (×10): 250 mg via ORAL
  Filled 2019-03-02 (×10): qty 1

## 2019-03-02 NOTE — Progress Notes (Signed)
[facility] PHYSICAL MEDICINE & REHABILITATION PROGRESS NOTE  Subjective/Complaints: No new issues.  Slept all right.  ROS: Patient denies fever, rash, sore throat, blurred vision, nausea, vomiting, diarrhea, cough, shortness of breath or chest pain,  headache, or mood change.    Objective: Vital Signs: Blood pressure 140/88, pulse (!) 110, temperature 98.5 F (36.9 C), temperature source Oral, resp. rate 17, height 5\' 6"  (1.676 m), weight (!) 174 kg, SpO2 100 %. No results found. Recent Labs    03/01/19 0612  WBC 8.9  HGB 9.9*  HCT 34.8*  PLT 211   Recent Labs    03/01/19 0612  NA 135  K 3.8  CL 102  CO2 22  GLUCOSE 98  BUN 21*  CREATININE 0.64  CALCIUM 8.7*    Physical Exam: BP 140/88 (BP Location: Right Wrist)   Pulse (!) 110   Temp 98.5 F (36.9 C) (Oral)   Resp 17   Ht 5\' 6"  (1.676 m)   Wt (!) 174 kg   SpO2 100%   BMI 61.90 kg/m  Constitutional: No distress . Vital signs reviewed.  Obese HEENT: EOMI, oral membranes moist Neck: supple Cardiovascular: RRR without murmur. No JVD    Respiratory: CTA Bilaterally without wheezes or rales. Normal effort    GI: BS +, non-tender, non-distended  Skin: dry, intact Extremities: no edema Musc: No edema or tenderness in extremities. Neurological: She isalert and oriented. Motor:   RLE HF, KE 2/5, 3/5 ADF, stable LLE: 1/5 hip flexion and knee extension, wiggles toes, no changes on motor exam.  Skin: warm Psychiatric: pleasant   Assessment/Plan: 1. 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  Physiatrist is providing close team supervision and 24 hour management of active medical problems listed below.  Physiatrist and rehab team continue to assess barriers to discharge/monitor patient progress toward functional and medical goals  Care Tool:  Bathing    Body parts  bathed by patient: Right arm, Left arm, Chest, Abdomen, Face, Front perineal area, Buttocks   Body parts bathed by helper: Front perineal area, Buttocks, Right upper leg, Left upper leg, Right lower leg, Left lower leg     Bathing assist Assist Level: 2 Helpers     Upper Body Dressing/Undressing Upper body dressing   What is the patient wearing?: Pull over shirt    Upper body assist Assist Level: Minimal Assistance - Patient > 75%    Lower Body Dressing/Undressing Lower body dressing      What is the patient wearing?: Pants, Ace wrap/stump shrinker     Lower body assist Assist for lower body dressing: 2 Helpers     Toileting Toileting    Toileting assist Assist for toileting: 2 Helpers     Transfers Chair/bed transfer  Transfers assist     Chair/bed transfer assist level: Dependent - mechanical lift     Locomotion Ambulation   Ambulation assist   Ambulation activity did not occur: Safety/medical concerns          Walk 10 feet activity   Assist  Walk 10 feet activity did not occur: Safety/medical concerns        Walk 50 feet activity   Assist Walk 50 feet with 2 turns activity did not occur: Safety/medical concerns         Walk 150 feet activity   Assist Walk 150 feet activity did not occur: Safety/medical concerns  Walk 10 feet on uneven surface  activity   Assist Walk 10 feet on uneven surfaces activity did not occur: Safety/medical concerns         Wheelchair     Assist Will patient use wheelchair at discharge?: Yes Type of Wheelchair: Manual Wheelchair activity did not occur: Safety/medical concerns  Wheelchair assist level: Supervision/Verbal cueing Max wheelchair distance: 165ft    Wheelchair 50 feet with 2 turns activity    Assist    Wheelchair 50 feet with 2 turns activity did not occur: Safety/medical concerns   Assist Level: Supervision/Verbal cueing   Wheelchair 150 feet activity      Assist Wheelchair 150 feet activity did not occur: Safety/medical concerns   Assist Level: Supervision/Verbal cueing      Medical Problem List and Plan: 1.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 No back brace required  Continue CIR   PT, OT, team conference today  PRAFO LLE at [place]  -SNF pending 2. DVT Prophylaxis/Anticoagulation: SCDs.   Vascular study limited, but negative 3. Pain Management:Neurontin 300 mg 3 times a day---increased to 400mg  tid with benefit  - hydrocodone as needed  -prn baclofen   Patellofemoral pain: voltaren gel helpful  Controlled on 3/10 4. Mood:Provide emotional support 5. Neuropsych: This patientiscapable of making decisions on herown behalf. 6. Skin/Wound Care:Routine skin checks 7. Fluids/Electrolytes/Nutrition:Routine ins and outs  I personally reviewed the patient's labs today.  wnl 8.Proteus/Escherichia coli UTI. Recurrent Neurogenic bladder Bactrim completed 2/12, recurrent symptoms of dysuria, + UA, Cx proteus based on  C and S    -keflex completed 9.  Super obesity. Latest BMI 64.79. Dietary follow-up  - still some behavioral/diet choice issues going on here 10. Neurogenic bowel and bladder. Pt with GI discomfort today of unclear etiology  Flomax started on 2/8, peri-care ongoing   -continue senna-s at night  -hold miralax for now   -changed to QOD fleet enema  Had bowel movement this morning 12.?  Anemia of chronic disease:   Hgb to 9.9 13.  Sleep disturbance: scheduled trazodone 14. Dysuria  Urine culture 100,000 gram-negative rods.  Begin Amoxil 250 mg 3 times daily      LOS: 32 days A FACE TO FACE EVALUATION WAS PERFORMED  [name] 03/02/2019, 9:08 AM

## 2019-03-02 NOTE — Progress Notes (Signed)
Occupational Therapy Session Note  Patient Details  Name: Ellen Hill MRN: [id number] Date of Birth: [birth year]  Today's Date: 03/02/2019 OT Individual Time: 9381-0175 OT Individual Time Calculation (min): 74 min    Short Term Goals: Week 1:  OT Short Term Goal 1 (Week 1): Pt will tolerate sitting EOB/mat for 3 minutes for self care tasks.  OT Short Term Goal 1 - Progress (Week 1): Met OT Short Term Goal 2 (Week 1): Pt will perform UB self care with min A overall in order to reduce assistance with functional tasks.  OT Short Term Goal 2 - Progress (Week 1): Met OT Short Term Goal 3 (Week 1): Pt will demonstrate B UE strengthening HEP with supervision and use of paper handout as needed. OT Short Term Goal 3 - Progress (Week 1): Met  Skilled Therapeutic Interventions/Progress Updates:    1:1. Pt received in bed. Total A to don pants and wrap BLE for edema management. Pt rolls B with MOD A for advancing pants past hips and placing sling. maxisky transfer to w/c with +2 A. Pt completes w/c mobility part way to outside courtyard. Pt completes towel slides with 5# weights to work on forward weight shifting in prep of slide board transfers, LE weight bearing on ground and UE strengthening. Pt smiling stating its the first time she has been outside since admission. Exited session with pt seated in w/c, call ligh in reac and all needs met Therapy Documentation Precautions:  Precautions Precautions: Back, Fall Precaution Comments: obese. No brace needed Restrictions Weight Bearing Restrictions: No  Therapy/Group: Individual Therapy  [name] 03/02/2019, 10:48 AM

## 2019-03-02 NOTE — Progress Notes (Signed)
Physical Therapy Session Note  Patient Details  Name: Ellen Hill MRN: [id number] Date of Birth: 08-07-1971  Today's Date: 03/02/2019 PT Individual Time: 3007-6226 PT Individual Time Calculation (min): 75 min   Short Term Goals: Week 5:  PT Short Term Goal 1 (Week 5): =LTG due to ELOS  Skilled Therapeutic Interventions/Progress Updates:    Pt received seated in w/c in room, agreeable to PT session. Pt reports 8/10 pain in mid-back that she describes as muscle soreness, tender to deep pressure. Seated BUE shoulder abduction and overhead stretch, arm across chest stretch, arm to opposite hip stretch 3 x 30 sec each. Seated shoulder rolls forwards/backwards x 15 reps. Education with patient during session about muscle soreness from exercise vs pain. Also educated pt on importance of continuing UE exercise when she leaves the hospital to maintain muscle strength, endurance, etc. Pt requests to return to bed at end of session. Maxisky transfer w/c to bed. Rolling L/R with mod A for removal of maxisky sling and placement of heating pad for pain relief. Pt has onset of urinary incontinence once seated in bed with heating pad. Pt is mod A for rolling L/R and dependent for brief change and pericare. Pt left seated in bed with needs in reach at end of session.  Therapy Documentation Precautions:  Precautions Precautions: Back, Fall Precaution Comments: obese. No brace needed Restrictions Weight Bearing Restrictions: No    Therapy/Group: Individual Therapy  Excell Seltzer, PT, DPT  03/02/2019, 3:59 PM

## 2019-03-02 NOTE — Progress Notes (Signed)
Physical Therapy Session Note  Patient Details  Name: Ellen Hill MRN: [id number] Date of Birth: 11-15-1971  Today's Date: 03/02/2019 PT Individual Time: [phone] PT Individual Time Calculation (min): 53 min   Short Term Goals: Week 5:  PT Short Term Goal 1 (Week 5): =LTG due to ELOS  Skilled Therapeutic Interventions/Progress Updates:   Pt in supine and agreeable to therapy, pain as detailed below. Max assist R/L rolling while 2nd helper donned pants w/ total assist and adjusted sling. Maxisky transfer to w/c. Total assist w/c transport to/from therapy gym. Worked on [employer] transfer this session. Performed 1 transfer to/from mat w/ min assist +2. Total assist for slide board placement and LE management on 4" step for support. Pt maintaining trunk control and using BUEs w/o assist to push up during transfer. Required 30 minutes to perform full transfer to/from 2/2 frequent rest breaks and repositioning for safety. Returned to room via w/c, ended session in w/c w/ all needs in reach.   Therapy Documentation Precautions:  Precautions Precautions: Back, Fall Precaution Comments: obese. No brace needed Restrictions Weight Bearing Restrictions: No Pain: Pain Assessment Pain Scale: 0-10 Pain Score: 7  Pain Type: Acute pain Pain Location: Leg Pain Orientation: Right;Left Pain Descriptors / Indicators: Aching Pain Frequency: Intermittent Pain Onset: With Activity Patients Stated Pain Goal: 0 Pain Intervention(s): Medication (See eMAR);Repositioned Multiple Pain Sites: No  Therapy/Group: Individual Therapy  Ellen Hill 03/02/2019, 1:58 PM

## 2019-03-03 ENCOUNTER — Inpatient Hospital Stay (HOSPITAL_COMMUNITY): Payer: Self-pay | Admitting: Occupational Therapy

## 2019-03-03 ENCOUNTER — Inpatient Hospital Stay (HOSPITAL_COMMUNITY): Payer: Self-pay | Admitting: Physical Therapy

## 2019-03-03 NOTE — Patient Care Conference (Signed)
Inpatient RehabilitationTeam Conference and Plan of Care Update Date: 03/02/2019   Time: 2:00 PM    Patient Name: Ellen Hill [facility]      Medical Record Number: [id number]  Date of Birth: 02-28-1971 Sex: Female         Room/Bed: 4W14C/4W14C-01 Payor Info: Payor: [place] / Plan: [facility] PPO / Product Type: *No Product type* /    Admitting Diagnosis: Thoracic myelopathy incomplete sci  Admit Date/Time:  01/29/2019  1:39 PM Admission Comments: No comment available   Primary Diagnosis:  <principal problem not specified> Principal Problem: <principal problem not specified>  Patient Active Problem List   Diagnosis Date Noted  . Dysuria   . Super obesity   . Anemia of chronic disease   . Leukocytosis   . Neurogenic bowel   . Neurogenic bladder   . E. coli UTI   . Myelopathy ([place]) 01/29/2019  . 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 Lumbar back pain with radiculopathy affecting lower extremity 12/08/2018  . Dysmenorrhea 04/13/2012  . Class 3 severe obesity due to excess calories without serious comorbidity with body mass index (BMI) of 60.0 to 69.9 in adult ([place]) 04/13/2012  . Anemia 04/13/2012  . Fibroids 04/13/2012  . Menorrhagia 03/07/2012    Expected Discharge Date: Expected Discharge Date: (SNF)  Team Members Present: Physician leading conference: Dr. Alger Simons Social Worker Present: Alfonse Alpers, LCSW Nurse Present: Rayetta Humphrey, RN PT Present: Other (comment)(Taylor Ervin Knack, PT) OT Present: Willeen Cass, OT SLP Present: Charolett Bumpers, SLP     Current Status/Progress Goal Weekly Team Focus  Medical   No substantial medical changes.  Still with profound weakness.  Pain is improved.  Has another UTI.  Having struggles with bowel program due to inconsistent by and from patient  Continue to manage medical issues to maximize therapy participation  See medical problem list   Bowel/Bladder    Incontinent of bowel/bladder. Patient on bowel program with  scheduleed enema. LBM 03/01/19  Regain normal bowel patter with mod assist  Continue bowel program   Swallow/Nutrition/ Hydration             ADL's   2 assist for self care tasks/toileting/toilet transfers using [facility]. On night baths. Functional sitting balance has improved  mod A LB dressing and toileting with min A for overall goals  Sitting balance, functional transfers, self care retraining, general strengthening + activity tolerance    Mobility   mod A rolling, dependent maxisky transfers, mod A x 2 slide board transfers, S w/c mobility  mod A overall  sitting balance, SB transfers, family education   Communication             Safety/Cognition/ Behavioral Observations            Pain   Complained of legs pain, scheduled gabapentin and Norco PRN  <2  Assess and treat pain q shift and as needed   Skin   Healing fissure to buttock fold and back incision   maintain skin integrity with mod assit  Assess skin q shift and as needed    Rehab Goals Patient on target to meet rehab goals: Yes Rehab Goals Revised: none *See Care Plan and progress notes for long and short-term goals.     Barriers to Discharge  Current Status/Progress Possible Resolutions Date Resolved   Physician    Medical stability;Neurogenic Bowel & Bladder        Continue per medical problem list  Nursing                  PT  Inaccessible home environment;Decreased caregiver support;Medical stability;Home environment access/layout;Incontinence;Weight                 OT                  SLP                SW                Discharge Planning/Teaching Needs:  Pt to go to SNF pending insurance authorization.  Bed offer has been made and we await insurance approval so that we can facilitate transfer.  Defer family education to next setting, although some education has already begun when sister is present.   Team Discussion:  Pt with no medical changes.  She continues to have pain, is more comfortable in  bed, and is on the every other day bowel program.  Pt having more incontinence.  Pt is mod A with UB OT taskes and total A for LB tasks.  Most transfers are with maxi-sky, as slideboard requires min to mod A +two people and it takes 30 minutes, making it not a functional task.    Revisions to Treatment Plan:  none    Continued Need for Acute Rehabilitation Level of Care: The patient requires daily medical management by a physician with specialized training in physical medicine and rehabilitation for the following conditions: Daily medical management of patient stability for increased activity during participation in an intensive rehabilitation regime.: Yes Daily analysis of laboratory values and/or radiology reports with any subsequent need for medication adjustment of medical intervention for : Post surgical problems;Neurological problems;Urological problems   I attest that I was present, lead the team conference, and concur with the assessment and plan of the team.   Joseluis Alessio, Silvestre Mesi 03/03/2019, 1:52 PM

## 2019-03-03 NOTE — Progress Notes (Signed)
[place] PHYSICAL MEDICINE & REHABILITATION PROGRESS NOTE  Subjective/Complaints: Concerned about drainage from her back incision. Intermittent back pain, leg pain still  ROS: Patient denies fever, rash, sore throat, blurred vision, nausea, vomiting, diarrhea, cough, shortness of breath or chest pain, joint or back pain, headache, or mood change.     Objective: Vital Signs: Blood pressure 118/64, pulse (!) 107, temperature 98.5 F (36.9 C), resp. rate 18, height 5\' 6"  (1.676 m), weight (!) 174 kg, SpO2 99 %. No results found. Recent Labs    03/01/19 0612  WBC 8.9  HGB 9.9*  HCT 34.8*  PLT 211   Recent Labs    03/01/19 0612  NA 135  K 3.8  CL 102  CO2 22  GLUCOSE 98  BUN 21*  CREATININE 0.64  CALCIUM 8.7*    Physical Exam: BP 118/64 (BP Location: Right Wrist)   Pulse (!) 107   Temp 98.5 F (36.9 C)   Resp 18   Ht 5\' 6"  (1.676 m)   Wt (!) 174 kg   SpO2 99%   BMI 61.90 kg/m  Constitutional: No distress . Vital signs reviewed. HEENT: EOMI, oral membranes moist Neck: supple Cardiovascular: RRR without murmur. No JVD    Respiratory: CTA Bilaterally without wheezes or rales. Normal effort    GI: BS +, non-tender, non-distended  Skin: back incision with small speck of dry blood on dressing. No other visible drainage on wound itself. No dehiscence  Extremities: no edema Musc: No edema or tenderness in extremities. Neurological: She isalert and oriented. Motor:   RLE HF, KE 2/5, 3/5 ADF, stable LLE: 1/5 hip flexion and knee extension, wiggles toes-motor exam unchanged  Skin: warm Psychiatric: pleasant   Assessment/Plan: 1. 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  Physiatrist is providing close team supervision and 24 hour management of active medical problems listed below.  Physiatrist and rehab team continue to  assess barriers to discharge/monitor patient progress toward functional and medical goals  Care Tool:  Bathing    Body parts bathed by patient: Right arm, Left arm, Chest, Abdomen, Face, Front perineal area, Buttocks   Body parts bathed by helper: Front perineal area, Buttocks, Right upper leg, Left upper leg, Right lower leg, Left lower leg     Bathing assist Assist Level: 2 Helpers     Upper Body Dressing/Undressing Upper body dressing   What is the patient wearing?: Pull over shirt    Upper body assist Assist Level: Minimal Assistance - Patient > 75%    Lower Body Dressing/Undressing Lower body dressing      What is the patient wearing?: Pants, Ace wrap/stump shrinker     Lower body assist Assist for lower body dressing: 2 Helpers     Toileting Toileting    Toileting assist Assist for toileting: 2 Helpers     Transfers Chair/bed transfer  Transfers assist     Chair/bed transfer assist level: Dependent - mechanical lift     Locomotion Ambulation   Ambulation assist   Ambulation activity did not occur: Safety/medical concerns          Walk 10 feet activity   Assist  Walk 10 feet activity did not occur: Safety/medical concerns        Walk 50 feet activity   Assist Walk 50 feet with 2 turns activity did not occur: Safety/medical concerns  Walk 150 feet activity   Assist Walk 150 feet activity did not occur: Safety/medical concerns         Walk 10 feet on uneven surface  activity   Assist Walk 10 feet on uneven surfaces activity did not occur: Safety/medical concerns         Wheelchair     Assist Will patient use wheelchair at discharge?: Yes Type of Wheelchair: Manual Wheelchair activity did not occur: Safety/medical concerns  Wheelchair assist level: Supervision/Verbal cueing Max wheelchair distance: 152ft    Wheelchair 50 feet with 2 turns activity    Assist    Wheelchair 50 feet with 2 turns  activity did not occur: Safety/medical concerns   Assist Level: Supervision/Verbal cueing   Wheelchair 150 feet activity     Assist Wheelchair 150 feet activity did not occur: Safety/medical concerns   Assist Level: Supervision/Verbal cueing      Medical Problem List and Plan: 1.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 No back brace required  -Continue CIR therapies including PT, OT   PRAFO LLE at [facility]  -SNF pending 2. DVT Prophylaxis/Anticoagulation: SCDs.   Vascular study limited, but negative 3. Pain Management:Neurontin 300 mg 3 times a day---increased to 400mg  tid with benefit  - hydrocodone as needed  -prn baclofen   Patellofemoral pain: voltaren gel helpful  Reasonable control 3/11 4. Mood:Provide emotional support 5. Neuropsych: This patientiscapable of making decisions on herown behalf. 6. Skin/Wound Care: Dry dressing to back. Not concerned about drainage today which was minimal at best 7. Fluids/Electrolytes/Nutrition:Routine ins and outs  Have discussed better dietary choices with pt 8.Proteus/Escherichia coli UTI. Recurrent Neurogenic bladder Bactrim completed 2/12, recurrent symptoms of dysuria, + UA, Cx proteus based on  C and S    -keflex completed 9.  Super obesity. Latest BMI 64.79. Dietary follow-up  - still some behavioral/diet choice issues going on here 10. Neurogenic bowel and bladder. Pt with GI discomfort today of unclear etiology  Flomax started on 2/8, peri-care ongoing   -continue senna-s at night  -hold miralax for now   -changed to QOD fleet enema  Had bowel movement this morning 12.?  Anemia of chronic disease:   Hgb to 9.9 13.  Sleep disturbance: scheduled trazodone 14. UTI  Urine culture 100,000 gram-negative rods, proteus, sensitive to amoxil, continue for 7 day course,  250 mg 3 times daily      LOS: 33 days A FACE TO  FACE EVALUATION WAS PERFORMED  [name] 03/03/2019, 10:53 AM

## 2019-03-03 NOTE — Progress Notes (Signed)
Occupational Therapy Session Note  Patient Details  Name: Jakiah Bienaime MRN: [id number] Date of Birth: [birth year]  Today's Date: 03/03/2019 OT Individual Time: 1330-1430 OT Individual Time Calculation (min): 60 min    Short Term Goals: Week 3:  OT Short Term Goal 1 (Week 3): STGs=LTGs due to ELOS  Skilled Therapeutic Interventions/Progress Updates:    Pt needed total assist for donning shoes prior to transfer to the EOB.  She needed max assist then for transition from supine to sitting EOB.  Once on the side of the bed, she was able to maintain sitting with close supervision.  Attempted sliding board transfer to the wheelchair, but therapist and tech unable to position board properly and pt with increased pain in the right hip secondary to bed support pushing into her leg.  Transitioned back to supine with total assist +2 (pt 30%) and then used the Maxi sky for transfer to the wheelchair.  Pt was given controls to raise and lower herself into the wheelchair as therapist assisted with stabilizing the wheelchair.  Next took pt down to the therapy gym where we attempted transfer to the mat via slidingboard.  Again pt unsuccessful secondary to board moving as pt attempted scooting.  After taking a couple of small scoots, therapist had pt transfer back to the wheelchair instead of continuing with unsafe transfer.  Finished session with return to the room and pt left up in the wheelchair at bedside in order to reach call button and phone in reach.    Therapy Documentation Precautions:  Precautions Precautions: Back, Fall Precaution Comments: obese. No brace needed Restrictions Weight Bearing Restrictions: No  Pain: Pain Assessment Pain Scale: Faces Pain Score: 5  Faces Pain Scale: Hurts a little bit Pain Type: Acute pain Pain Location: Hip Pain Orientation: Right Pain Radiating Towards: leg Pain Descriptors / Indicators: Discomfort Pain Onset: With Activity Pain Intervention(s):  Repositioned PAINAD (Pain Assessment in Advanced Dementia) Breathing: normal Negative Vocalization: none Facial Expression: smiling or inexpressive Body Language: relaxed Consolability: no need to console PAINAD Score: 0 ADL: See Care Tool Section for some details of ADL  Therapy/Group: Individual Therapy  Jacorie Ernsberger OTR/L 03/03/2019, 3:44 PM

## 2019-03-03 NOTE — Progress Notes (Signed)
Social Work Patient ID: Ellen Hill, female   DOB: [birth year], 48 y.o.   MRN: [id number]   CSW met with pt and her sister to update them on team conference discussion and to let them know that even though we have a SNF bed offer, we await the insurance company to authorize pt's care.  Pt expressed understanding and appreciation.  CSW will continue to follow and assist as needed.

## 2019-03-03 NOTE — Progress Notes (Signed)
Physical Therapy Session Note  Patient Details  Name: Ellen Hill MRN: [id number] Date of Birth: 1971/12/16  Today's Date: 03/03/2019 PT Individual Time: 0800-0915 PT Individual Time Calculation (min): 75 min   Short Term Goals: Week 5:  PT Short Term Goal 1 (Week 5): =LTG due to ELOS  Skilled Therapeutic Interventions/Progress Updates:    Pt received seated in bed, agreeable to PT. No complaints of pain but does report trouble sleeping overnight due to back pain and burning sensation in LLE. Dependent to don BLE ACE wrap. Rolling L/R with mod A and use of bedrails for dependent donning of pants and placement of maxisky sling. Maxisky transfer bed to w/c. Manual w/c propulsion x 100 ft with use of BUE and Supervision. Session focus on core musculature activation leaning forwards and laterally while seated in w/c reaching for targets across midline and outside BOS playing Connect 4 game. Pt left seated in w/c in room with needs in reach at end of session.  Therapy Documentation Precautions:  Precautions Precautions: Back, Fall Precaution Comments: obese. No brace needed Restrictions Weight Bearing Restrictions: No    Therapy/Group: Individual Therapy  Excell Seltzer, PT, DPT  03/03/2019, 12:11 PM

## 2019-03-03 NOTE — Progress Notes (Signed)
Occupational Therapy Session Note  Patient Details  Name: Ellen Hill MRN: [id number] Date of Birth: 1971-12-18  Today's Date: 03/03/2019 OT Individual Time: 0945-1100 OT Individual Time Calculation (min): 75 min    Short Term Goals: Week 1:  OT Short Term Goal 1 (Week 1): Pt will tolerate sitting EOB/mat for 3 minutes for self care tasks.  OT Short Term Goal 1 - Progress (Week 1): Met OT Short Term Goal 2 (Week 1): Pt will perform UB self care with min A overall in order to reduce assistance with functional tasks.  OT Short Term Goal 2 - Progress (Week 1): Met OT Short Term Goal 3 (Week 1): Pt will demonstrate B UE strengthening HEP with supervision and use of paper handout as needed. OT Short Term Goal 3 - Progress (Week 1): Met Week 2:  OT Short Term Goal 1 (Week 2): Pt will complete 2 self care tasks in unsupported sitting with Mod A for balance  OT Short Term Goal 1 - Progress (Week 2): Not met OT Short Term Goal 2 (Week 2): Pt will incorporate pressure relief techniques into daily routine AEB pt report  OT Short Term Goal 2 - Progress (Week 2): Not met OT Short Term Goal 3 (Week 2): Pt will be able to direct care for OOB toileting using BSC AEB pt and staff report OT Short Term Goal 3 - Progress (Week 2): Not met Week 3:  OT Short Term Goal 1 (Week 3): STGs=LTGs due to ELOS  Skilled Therapeutic Interventions/Progress Updates:    Pt received in w/c stating she was all ready for the day. Pt agreeable to therapy.  Discussed her progress so far and how she has done with sliding board transfers.  Pt described mid back discomfort with forward reaching.    Pt brought to day room to work at hi low table on exercises for improving postural control with scapular retraction,  Large arm circles with abduction and extension with upper back held in extension.  Lateral leans for pressure relief and tricep extension strength. Placed pillow case on floor under her R foot for AROM of knee  flex/ext Pt also able to do slight active L  knee extension against gravity.  Pt taken back to room with all needs met.  Therapy Documentation Precautions:  Precautions Precautions: Back, Fall Precaution Comments: obese. No brace needed Restrictions Weight Bearing Restrictions: No    Vital Signs:  Pain: Pain Assessment Pain Score: 5  Faces Pain Scale: No hurt PAINAD (Pain Assessment in Advanced Dementia) Breathing: normal Negative Vocalization: none Facial Expression: smiling or inexpressive Body Language: relaxed Consolability: no need to console PAINAD Score: 0   Therapy/Group: Individual Therapy  [place]ary 03/03/2019, 12:10 PM

## 2019-03-04 ENCOUNTER — Inpatient Hospital Stay (HOSPITAL_COMMUNITY): Payer: Self-pay

## 2019-03-04 ENCOUNTER — Inpatient Hospital Stay (HOSPITAL_COMMUNITY): Payer: Self-pay | Admitting: Physical Therapy

## 2019-03-04 NOTE — Discharge Summary (Signed)
Physician Discharge Summary  Patient ID: Shaila Gilchrest MRN: [id number] DOB/AGE: July 19, 1971 48 y.o.  Admit date: 01/29/2019 Discharge date: 03/05/2019  Discharge Diagnoses:  Active Problems:   Myelopathy (HCC)   Anemia of chronic disease   Leukocytosis   Neurogenic bowel   Neurogenic bladder   E. coli UTI   Super obesity   Dysuria DVT prophylaxis Pain management Morbid obesity Neurogenic bowel and bladder Surgical PCR screening positive  Discharged Condition: Stable  Significant Diagnostic Studies: No results found.  Labs:  Basic Metabolic Panel: Recent Labs  Lab 03/01/19 0612  NA 135  K 3.8  CL 102  CO2 22  GLUCOSE 98  BUN 21*  CREATININE 0.64  CALCIUM 8.7*    CBC: Recent Labs  Lab 03/01/19 0612  WBC 8.9  HGB 9.9*  HCT 34.8*  MCV 81.3  PLT 211    CBG: No results for input(s): GLUCAP in the last 168 hours. Family history.  Mother with hypertension as well as breast cancer.  Father with hypertension and prostate cancer as well as myocardial infarction.  Sister with hypertension maternal aunt with diabetes mellitus  Physical exam.  Blood pressure 124/90 pulse 102 temperature 97.5 respirations 18 oxygen saturations 100%. Constitutional.  No distress cooperative with exam HEENT.  Head normocephalic eyes pupils are round and reactive to light no nystagmus Neck.  Normal range of motion no tracheal deviation present no thyromegaly present without bruit or JVD Cardiac normal rate no murmurs gallops or rubs. Respiratory effort normal no rhonchi or wheezes good inspiratory effort. Abdomen soft obese exhibits no distention nontender Neurological.  Alert mood was a bit flat but appropriate upper extremity 5 out of 5 right lower extremity trace hip flexors trace 1-2 out of 5 knee extension 1 to 1+ out of 5 ankle dorsiflexion plantar flexion.,  Left lower extremity 05 proximal to distal decreased sensation to light touch pain at the waist. Brief HPI:    Raechelle Sarti is a 48 year old right-handed female with history of migraine headaches, obesity with BMI 64.76.  Per chart review patient lives with her sister and 45 year old daughter.  One level apartment multiple steps to entry.  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  Presented 01/17/2019 with gait instability bilateral lower extremity weakness x2-1/2 months to the point she was essentially nonambulatory.  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  Patient underwent T9-10-03-11 and partial L1 decompressive laminotomies 01/19/2019 per Dr. Annette Stable.  No back brace required.  Surgical PCR screening positive for contact precautions.  Hospital course ongoing pain management.  Decadron protocol as indicated.  Elevated WBC of 20,500 decreased to 17,700 monitored closely while on steroid therapy with urine culture completed 01/27/2019 greater than 100,000 Proteus and 50,000 E. coli maintained on Bactrim.  Bouts of diarrhea noted patient initially did have a rectal tube in place that is since been removed.  Therapy evaluations completed and patient was admitted for a comprehensive rehab program. Hospital Course: Jolanta Cabeza was admitted to rehab 01/29/2019 for inpatient therapies to consist of PT, ST and OT at least three hours five days a week. Past admission physiatrist, therapy team and rehab RN have worked together to provide customized collaborative inpatient rehab.   Rehab course: During patient's stay in rehab weekly team conferences were held to monitor patient's progress, set goals and discuss barriers to discharge. At admission,  patient required +2 physical assist rolling, plus to physical assist supine to sit.  Max assist plus to chair roll to the left.  +3 assist for lift from bed to vital go tilt bed.  Moderate assist upper body total assist lower  body ADLs.  Pertaining to Ms. 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  No back brace required.  Surgical site monitored closely.  PRAFO left lower extremity at bedtime.  DVT prophylaxis with SCDs vascular studies negative.  Pain management with the use of Neurontin 400 mg 3 times daily, Voltaren gel 4 times daily to affected area, hydrocodone as needed for pain.  She completed a course of Bactrim for E. coli Proteus UTI repeat culture also positive completing a 7-day course of amoxicillin.  Noted recurrent neurogenic bladder as well as bowel she did need some encouragement to maintain her bowel program.  She remained on Flomax 0.4 mg daily.  Acute on chronic anemia iron supplement latest hemoglobin 9.9 no active bleeding.  Morbid obesity BMI 64.79 with follow-up per dietary services.  She  has had improvement in activity tolerance, balance, postural control as well as ability to compensate for deficits. He/She has had improvement in functional use RUE/LUE  and RLE/LLE as well as improvement in awareness.  Patient received interdisciplinary team conferences weekly to establish estimated length of stay is and goals of care.  Wheelchair propulsion using bilateral upper extremities 100 feet with pillow behind back to decrease trunk extension and posterior pelvic tilt.  Slide board transfers +2 wheelchair to mat using steep bench under feet needed frequent repositioning.  She tolerates tilt table x10 minutes.  She needed max assist for transition from supine to sitting edge of bed.  Due to limited progress as well as support at home and access to her apartment it was felt skilled nursing facility was needed.       Disposition:  Discharge to skilled nursing facility   Diet: Regular consistency  Special Instructions: Routine back precautions  Medications at discharge. 1.   Tylenol as needed 2.  Amoxicillin 250 mg p.o. every 8 hours x4 days 3.  Baclofen 10 mg every 8 hours as needed muscle spasms 4.  Voltaren gel 1% 4 times daily to affected area 5.  Neurontin 400 mg p.o. 3 times daily 6.  Hydrocodone 5-325 mg 1 or 2 tablets every 4 hours as needed pain 7.  Niferex 150 mg p.o. daily 8.  Senokot S2 tablets p.o. nightly 9.  Fleet enema rectal daily at 0600 10.  Flomax 0.4 mg p.o. daily after supper 11.  Trazodone 50 mg p.o. nightly    Contact information for follow-up providers    [name], MD Follow up.   Specialty:  Physical Medicine and Rehabilitation Why:  office to call for appointment Contact information: [address] [place] [address] [phone]        Earnie Larsson, MD Follow up.   Specialty:  Neurosurgery Why:  call for appointment Contact information: [address] [place] [address] [phone]            Contact information for after-discharge care    Destination    [facility] .   Service:  Skilled Chiropodist information: [address] [place] [phone]  Signed: Lavon Paganini Que Meneely 03/04/2019, 6:57 PM

## 2019-03-04 NOTE — Progress Notes (Signed)
Physical Therapy Session Note  Patient Details  Name: Ellen Hill MRN: [id number] Date of Birth: 1971/02/11  Today's Date: 03/04/2019 PT Individual Time: 1100-1205 PT Individual Time Calculation (min): 65 min   Short Term Goals:  Week 4:  PT Short Term Goal 1 (Week 4): =LTG due to ELOS PT Short Term Goal 1 - Progress (Week 4): Progressing toward goal Week 5:  PT Short Term Goal 1 (Week 5): =LTG due to ELOS     Skilled Therapeutic Interventions/Progress Updates:   Pt sitting up in w/c.  neuromuscular re-education via multimodal cues and demo for 10 x 1 bil hip adduction, R knee flexion/extension with foot on moveable block, bil glut sets.Marland Kitchen  PROM R/L hamstring and heel cords x 30 seconds x 2 each.  W/c propulsion using bil UEs x 100' with pillow behind back to decrease trunk extension and posterior pelvic tilt.  Cues for efficeincy and turns, supervision.   Seated in w/c, activity for trunk and bil UEs strengthening, bouncing ball to target 8' in front of her, catching ball from PT at chest height, x 20.  Slide board transfer +2 to R w/c> mat, to L, mat> w/c, using teal colored step bench under feet, with frequent repositioning of bil feet for comfort and optimal biomechanical position. Pt liked using the teal bench.   Pt left resting in w/c with needs at hand and rolling table in front of her.      Therapy Documentation Precautions:  Precautions Precautions: Back, Fall Precaution Comments: obese. No brace needed Restrictions Weight Bearing Restrictions: No   Pain: Pain Assessment Pain Scale: 0-10 Pain Score: 4 - LLE; frequent repositioning of LLE, stretching bil LEs     Therapy/Group: Individual Therapy  Nawaf Strange 03/04/2019, 12:14 PM

## 2019-03-04 NOTE — Progress Notes (Signed)
Pt states that she is a little nervous about leaving Rehab tomorrow.  Pt allowed to express self given encouragement and support. Pt medicated for pain. T&P for comfort. Resting at present.

## 2019-03-04 NOTE — Progress Notes (Signed)
Physical Therapy Session Note  Patient Details  Name: Ellen Hill MRN: [id number] Date of Birth: 04/08/1971  Today's Date: 03/04/2019 PT Individual Time: 1300-1415 PT Individual Time Calculation (min):  75 min  Short Term Goals: Week 5:  PT Short Term Goal 1 (Week 5): =LTG due to ELOS  Skilled Therapeutic Interventions/Progress Updates:    Pt received seated in w/c in room, agreeable to PT session. No complaints of pain. Pt states urge to urinate. Maxisky transfer w/c to bed. Dependent for placement of urinal, pericare, and donning of new brief. Rolling L/R with mod A and use of bedrails for dependent donning pants. Maxisky transfer bed to tilt table. Supine BP 141/90. Tilt table increased to 20 degrees. Pt remains asymptomatic, BP 127/84. Pt does report onset of BLE pain and discomfort but willing reports it is tolerable. Increased incline to 30 degrees, BP 126/85. Pt tolerates tilt table x 10 min before requesting to return to supine due to BLE pain from Casey. Pt unable to describe pain other than that it exists. Pt requests to return to bed at end of session. Maxisky tranfer tilt table to bed. Supine to sitting in bed with HOB elevated and use of bedrails for removal of sling. Pt left seated in bed with needs in reach. Pt does appear to have some anxiety following tilt table with BUE tremors, vitals WNL and RN aware.  Therapy Documentation Precautions:  Precautions Precautions: Back, Fall Precaution Comments: obese. No brace needed Restrictions Weight Bearing Restrictions: No    Therapy/Group: Individual Therapy   Excell Seltzer, PT, DPT  03/04/2019, 3:37 PM

## 2019-03-04 NOTE — Progress Notes (Signed)
Occupational Therapy Session Note  Patient Details  Name: Gwyndolyn Guilford MRN: [id number] Date of Birth: 02-21-1971  Today's Date: 03/04/2019 OT Individual Time: 0847-1000 OT Individual Time Calculation (min): 73 min    Short Term Goals: Week 1:  OT Short Term Goal 1 (Week 1): Pt will tolerate sitting EOB/mat for 3 minutes for self care tasks.  OT Short Term Goal 1 - Progress (Week 1): Met OT Short Term Goal 2 (Week 1): Pt will perform UB self care with min A overall in order to reduce assistance with functional tasks.  OT Short Term Goal 2 - Progress (Week 1): Met OT Short Term Goal 3 (Week 1): Pt will demonstrate B UE strengthening HEP with supervision and use of paper handout as needed. OT Short Term Goal 3 - Progress (Week 1): Met  Skilled Therapeutic Interventions/Progress Updates:    1:1. Pain reported but not rated in back and RN delivers medication during session. Pt reporting need to urinate. Pt able to void bladder into urinal with total A. OT provides total A for ace wrapping BLE, threading pants, and advancing pants hips with MOD A provided rolling B. Pt maxi sky transfer bed>w/c<>EOM. Pt sits EOM with supervision while completing 3x10 kicks of ball rolling at pt with BLE with B feet slightly elevated off floor. Pt propels w/c back to room with S and exited room with pt setaed in w/c, call light in reach and all needs met.   Therapy Documentation Precautions:  Precautions Precautions: Back, Fall Precaution Comments: obese. No brace needed Restrictions Weight Bearing Restrictions: No   Therapy/Group: Individual Therapy  [name] 03/04/2019, 12:06 PM

## 2019-03-04 NOTE — Progress Notes (Signed)
[place] PHYSICAL MEDICINE & REHABILITATION PROGRESS NOTE  Subjective/Complaints: Complains of discomfort related to her menses which started yesterday. Saw her up later with OT and was doing fairly well at edge of mat  ROS: Patient denies fever, rash, sore throat, blurred vision, nausea, vomiting, diarrhea, cough, shortness of breath or chest pain,   headache, or mood change.   Objective: Vital Signs: Blood pressure 128/81, pulse 100, temperature 98.6 F (37 C), temperature source Oral, resp. rate 18, height 5\' 6"  (1.676 m), weight (!) 174 kg, SpO2 100 %. No results found. No results for input(s): WBC, HGB, HCT, PLT in the last 72 hours. No results for input(s): NA, K, CL, CO2, GLUCOSE, BUN, CREATININE, CALCIUM in the last 72 hours.  Physical Exam: BP 128/81 (BP Location: Right Arm)   Pulse 100   Temp 98.6 F (37 C) (Oral)   Resp 18   Ht 5\' 6"  (1.676 m)   Wt (!) 174 kg   SpO2 100%   BMI 61.90 kg/m  Constitutional: No distress . Vital signs reviewed. Morbidly obese HEENT: EOMI, oral membranes moist Neck: supple Cardiovascular: RRR without murmur. No JVD    Respiratory: CTA Bilaterally without wheezes or rales. Normal effort    GI: BS +, non-tender, non-distended   Skin: back incision with small speck of dry blood on dressing. No other visible drainage on wound itself. No dehiscence  Extremities: no edema Musc: No edema or tenderness in extremities. Neurological: She isalert and oriented. Motor:   RLE HF, KE 2/5, 3/5 ADF, no real changes LLE: 1/5 hip flexion and knee extension, wiggles toes-stable exam  Skin: warm Psychiatric: pleasant   Assessment/Plan: 1. 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  Physiatrist is providing close team supervision and 24 hour management of active medical problems listed below.  Physiatrist and  rehab team continue to assess barriers to discharge/monitor patient progress toward functional and medical goals  Care Tool:  Bathing    Body parts bathed by patient: Right arm, Left arm, Chest, Abdomen, Face, Front perineal area, Buttocks   Body parts bathed by helper: Front perineal area, Buttocks, Right upper leg, Left upper leg, Right lower leg, Left lower leg     Bathing assist Assist Level: 2 Helpers     Upper Body Dressing/Undressing Upper body dressing   What is the patient wearing?: Pull over shirt    Upper body assist Assist Level: Minimal Assistance - Patient > 75%    Lower Body Dressing/Undressing Lower body dressing      What is the patient wearing?: Pants, Ace wrap/stump shrinker     Lower body assist Assist for lower body dressing: 2 Helpers     Toileting Toileting    Toileting assist Assist for toileting: 2 Helpers     Transfers Chair/bed transfer  Transfers assist     Chair/bed transfer assist level: Dependent - mechanical lift     Locomotion Ambulation   Ambulation assist   Ambulation activity did not occur: Safety/medical concerns          Walk 10 feet activity   Assist  Walk 10 feet activity did not occur: Safety/medical concerns        Walk 50 feet activity   Assist Walk 50 feet with 2 turns activity did not occur: Safety/medical concerns         Walk 150 feet activity   Assist Walk 150  feet activity did not occur: Safety/medical concerns         Walk 10 feet on uneven surface  activity   Assist Walk 10 feet on uneven surfaces activity did not occur: Safety/medical concerns         Wheelchair     Assist Will patient use wheelchair at discharge?: Yes Type of Wheelchair: Manual Wheelchair activity did not occur: Safety/medical concerns  Wheelchair assist level: Supervision/Verbal cueing Max wheelchair distance: 135ft    Wheelchair 50 feet with 2 turns activity    Assist    Wheelchair  50 feet with 2 turns activity did not occur: Safety/medical concerns   Assist Level: Supervision/Verbal cueing   Wheelchair 150 feet activity     Assist Wheelchair 150 feet activity did not occur: Safety/medical concerns   Assist Level: Supervision/Verbal cueing      Medical Problem List and Plan: 1.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 No back brace required  -Continue CIR therapies including PT, OT   PRAFO LLE at HS  -SNF pending 2. DVT Prophylaxis/Anticoagulation: SCDs.   Vascular study limited, but negative 3. Pain Management:Neurontin 300 mg 3 times a day---increased to 400mg  tid with benefit  - hydrocodone as needed  -prn baclofen   Patellofemoral pain: voltaren gel helpful  -treat menstrual pain supportively 4. Mood:Provide emotional support 5. Neuropsych: This patientiscapable of making decisions on herown behalf. 6. Skin/Wound Care: Dry dressing to back. Not concerned about drainage today which was minimal at best 7. Fluids/Electrolytes/Nutrition:Routine ins and outs  Have discussed better dietary choices with pt 8.Proteus/Escherichia coli UTI. Recurrent Neurogenic bladder Bactrim completed 2/12,    -repeat ucx positive---amoxil 250mg  bid for 7 days 9.  Super obesity. Latest BMI 64.79. Dietary follow-up  - still some behavioral/diet choice issues going on here 10. Neurogenic bowel and bladder. Pt with GI discomfort today of unclear etiology  Flomax started on 2/8, peri-care ongoing   -continue senna-s at night  -hold miralax for now   -continue QOD bowel program with fleet enema  Had bowel movement this morning 12.?  Anemia of chronic disease:   Hgb to 9.9 13.  Sleep disturbance: scheduled trazodone        LOS: 34 days A FACE TO [place] 03/04/2019, 11:21 AM

## 2019-03-05 ENCOUNTER — Inpatient Hospital Stay (HOSPITAL_COMMUNITY): Payer: Self-pay | Admitting: Occupational Therapy

## 2019-03-05 ENCOUNTER — Inpatient Hospital Stay (HOSPITAL_COMMUNITY): Payer: Self-pay | Admitting: Physical Therapy

## 2019-03-05 DIAGNOSIS — Z79899 Other long term (current) drug therapy: Secondary | ICD-10-CM | POA: Diagnosis not present

## 2019-03-05 DIAGNOSIS — Z6841 Body Mass Index (BMI) 40.0 and over, adult: Secondary | ICD-10-CM | POA: Diagnosis not present

## 2019-03-05 DIAGNOSIS — N319 Neuromuscular dysfunction of bladder, unspecified: Secondary | ICD-10-CM | POA: Diagnosis not present

## 2019-03-05 DIAGNOSIS — Z4789 Encounter for other orthopedic aftercare: Secondary | ICD-10-CM | POA: Diagnosis not present

## 2019-03-05 DIAGNOSIS — F4321 Adjustment disorder with depressed mood: Secondary | ICD-10-CM | POA: Diagnosis not present

## 2019-03-05 DIAGNOSIS — J45909 Unspecified asthma, uncomplicated: Secondary | ICD-10-CM | POA: Diagnosis not present

## 2019-03-05 DIAGNOSIS — K5900 Constipation, unspecified: Secondary | ICD-10-CM | POA: Diagnosis not present

## 2019-03-05 DIAGNOSIS — Z87891 Personal history of nicotine dependence: Secondary | ICD-10-CM | POA: Diagnosis not present

## 2019-03-05 DIAGNOSIS — B9620 Unspecified Escherichia coli [E. coli] as the cause of diseases classified elsewhere: Secondary | ICD-10-CM | POA: Diagnosis not present

## 2019-03-05 DIAGNOSIS — K592 Neurogenic bowel, not elsewhere classified: Secondary | ICD-10-CM | POA: Diagnosis not present

## 2019-03-05 DIAGNOSIS — D638 Anemia in other chronic diseases classified elsewhere: Secondary | ICD-10-CM | POA: Diagnosis not present

## 2019-03-05 DIAGNOSIS — F419 Anxiety disorder, unspecified: Secondary | ICD-10-CM | POA: Diagnosis not present

## 2019-03-05 DIAGNOSIS — G8220 Paraplegia, unspecified: Secondary | ICD-10-CM | POA: Diagnosis not present

## 2019-03-05 DIAGNOSIS — B964 Proteus (mirabilis) (morganii) as the cause of diseases classified elsewhere: Secondary | ICD-10-CM | POA: Diagnosis not present

## 2019-03-05 DIAGNOSIS — N390 Urinary tract infection, site not specified: Secondary | ICD-10-CM | POA: Diagnosis not present

## 2019-03-05 DIAGNOSIS — R159 Full incontinence of feces: Secondary | ICD-10-CM | POA: Diagnosis not present

## 2019-03-05 DIAGNOSIS — G4700 Insomnia, unspecified: Secondary | ICD-10-CM | POA: Diagnosis not present

## 2019-03-05 MED ORDER — BACLOFEN 10 MG PO TABS: 10.0000 mg | ORAL_TABLET | Freq: Three times a day (TID) | ORAL | 0 refills | Status: DC | PRN

## 2019-03-05 MED ORDER — SENNOSIDES-DOCUSATE SODIUM 8.6-50 MG PO TABS: 2.0000 | ORAL_TABLET | Freq: Every day | ORAL | Status: DC

## 2019-03-05 MED ORDER — TAMSULOSIN HCL 0.4 MG PO CAPS: 0.4000 mg | ORAL_CAPSULE | Freq: Every day | ORAL | Status: DC

## 2019-03-05 MED ORDER — DICLOFENAC SODIUM 1 % TD GEL: 2.0000 g | Freq: Four times a day (QID) | TRANSDERMAL | Status: DC

## 2019-03-05 MED ORDER — FLEET ENEMA 7-19 GM/118ML RE ENEM: 1.0000 | ENEMA | Freq: Every day | RECTAL | 0 refills | Status: DC

## 2019-03-05 MED ORDER — POLYSACCHARIDE IRON COMPLEX 150 MG PO CAPS: 150.0000 mg | ORAL_CAPSULE | Freq: Every day | ORAL | Status: DC

## 2019-03-05 MED ORDER — TRAZODONE HCL 50 MG PO TABS: 50.0000 mg | ORAL_TABLET | Freq: Every day | ORAL | 0 refills | Status: DC

## 2019-03-05 MED ORDER — ACETAMINOPHEN 325 MG PO TABS: 650.0000 mg | ORAL_TABLET | ORAL | Status: DC | PRN

## 2019-03-05 MED ORDER — AMOXICILLIN 250 MG PO CAPS: 250.0000 mg | ORAL_CAPSULE | Freq: Three times a day (TID) | ORAL | Status: DC

## 2019-03-05 MED ORDER — HYDROCODONE-ACETAMINOPHEN 5-325 MG PO TABS: 1.0000 | ORAL_TABLET | ORAL | 0 refills | Status: DC | PRN

## 2019-03-05 MED ORDER — GABAPENTIN 400 MG PO CAPS: 400.0000 mg | ORAL_CAPSULE | Freq: Three times a day (TID) | ORAL | Status: DC

## 2019-03-05 NOTE — Progress Notes (Signed)
Patient was D/C to [facility] via ambulance. Patients personal belongings were taken with her 2 family members. All questions were answered prior to discharge. Doy Hutching, LPN

## 2019-03-05 NOTE — Progress Notes (Signed)
Physical Therapy Discharge Summary  Patient Details  Name: Ellen Hill MRN: [id number] Date of Birth: July 08, 1971  Today's Date: 03/05/2019 PT Individual Time: 1100-1200 PT Individual Time Calculation (min): 60 min    Patient has met 3 of 5 long term goals due to improved activity tolerance, improved balance, improved postural control, increased strength and ability to compensate for deficits.  Patient to discharge at a wheelchair level Total Assist with 2 helpers for transfers and at an independent level with w/c mobility.   Patient's care partner is unable to provide the necessary physical assistance at discharge therefore patient will d/c to SNF to improve independence with overall mobility in order to lessen burden of care on family.  Reasons goals not met: Pt did not meet transfer and bed mobility goals set at min A, pt still requires assist x 2 for transfers or use of a dependent lift.  Recommendation:  Patient will benefit from ongoing skilled PT services in skilled nursing facility setting to continue to advance safe functional mobility, address ongoing impairments in balance, strength, independence with functional mobility, endurance, and minimize fall risk.  Equipment: No equipment provided. Equipment to be provided by admitting facility.  Reasons for discharge: lack of progress toward goals and discharge from hospital  Patient/family agrees with progress made and goals achieved: Yes   Skilled Intervention: Pt received seated in w/c in room, agreeable to PT session. No complaints of pain. With max encouragement pt agreeable to perform slide board transfer this session. Manual w/c propulsion x 150 ft with use of BUE mod I. Slide board transfer w/c to mat table with mod A x 2. Pt requires increased time, cueing, and encouragement to complete transfer. Maxisky transfer mat table to w/c then w/c to bed. Pt left seated in bed with needs in reach.  PT  Discharge Precautions/Restrictions Precautions Precautions: Back;Fall Restrictions Weight Bearing Restrictions: No Vision/Perception  Perception Perception: Within Functional Limits Praxis Praxis: Intact  Cognition Overall Cognitive Status: Within Functional Limits for tasks assessed Arousal/Alertness: Awake/alert Orientation Level: Oriented X4 Attention: Selective Sustained Attention: Appears intact Selective Attention: Appears intact Memory: Appears intact Awareness: Appears intact Problem Solving: Appears intact Safety/Judgment: Appears intact Sensation Sensation Light Touch: Impaired Detail Central sensation comments: impaired T9 and below Light Touch Impaired Details: Impaired RLE;Impaired LLE(improved since eval R>L) Proprioception: Impaired Detail Proprioception Impaired Details: Impaired RLE;Impaired LLE(improved since eval R>L) Coordination Gross Motor Movements are Fluid and Coordinated: No Fine Motor Movements are Fluid and Coordinated: Yes Coordination and Movement Description: impaired by weakness and paraplegia Heel Shin Test: unable to assess 2/2 paraplegia Motor  Motor Motor: Paraplegia;Abnormal tone;Abnormal postural alignment and control Motor - Skilled Clinical Observations: incomplete motor paraplegia below T9 Motor - Discharge Observations: incomplete motor paraplegia below T9  Mobility Bed Mobility Bed Mobility: Rolling Right;Rolling Left;Supine to Sit;Sit to Supine Rolling Right: Moderate Assistance - Patient 50-74% Rolling Left: Moderate Assistance - Patient 50-74% Supine to Sit: 2 Helpers Sit to Supine: 2 Helpers Transfers Transfers: Adult nurse via Arts development officer Transfers: 2 Press photographer (Assistive device): Other (Comment)(slide board) Transfer via Lift Equipment: Public house manager: No Gait Gait: No Stairs / Additional Locomotion Stairs: No Programmer, systems: Yes Wheelchair Assistance: Independent with Camera operator: Both upper extremities Wheelchair Parts Management: Needs assistance Distance: 150  Trunk/Postural Assessment  Cervical Assessment Cervical Assessment: Within Functional Limits Thoracic Assessment Thoracic Assessment: Exceptions to WFL(limited mobility 2/2 pain) Lumbar Assessment Lumbar Assessment: Exceptions to WFL(posterior pelvic tilt) Postural  Control Postural Control: Deficits on evaluation Trunk Control: S for sitting balance Postural Limitations: improved since eval  Balance Balance Balance Assessed: Yes Static Sitting Balance Static Sitting - Balance Support: No upper extremity supported;Feet supported Static Sitting - Level of Assistance: 5: Stand by assistance Dynamic Sitting Balance Dynamic Sitting - Balance Support: Bilateral upper extremity supported;Feet supported;During functional activity Dynamic Sitting - Level of Assistance: 4: Min assist Extremity Assessment   RLE Assessment RLE Assessment: Exceptions to [facility] Passive Range of Motion (PROM) Comments: limited due to body habitus General Strength Comments: see below RLE Strength Right Hip Flexion: 2+/5 Right Knee Flexion: 2/5 Right Knee Extension: 2/5 Right Ankle Dorsiflexion: 3-/5 LLE Assessment LLE Assessment: Exceptions to [facility] Passive Range of Motion (PROM) Comments: limited due to body habitus General Strength Comments: see below LLE Strength Left Hip Flexion: 1/5 Left Knee Flexion: 1/5 Left Knee Extension: 1/5 Left Ankle Dorsiflexion: 1/5     Excell Seltzer, PT, DPT 03/05/2019, 12:30 PM

## 2019-03-05 NOTE — Progress Notes (Signed)
Occupational Therapy Session Note  Patient Details  Name: Ellen Hill MRN: [id number] Date of Birth: June 01, 1971  Today's Date: 03/05/2019 OT Individual Time: 3545-6256 and 1414-1450 OT Individual Time Calculation (min): 64 min and 36 min 39 minutes missed due to transport team arrival   Short Term Goals: Week 3:  OT Short Term Goal 1 (Week 3): STGs=LTGs due to ELOS  Skilled Therapeutic Interventions/Progress Updates:    Pt greeted in bed and premedicated for pain. Started with ACE wrapping B LEs Total A. 2 helpers for supine<sit to don pants while sitting unsupported. Steady assist of 1 helper required for dynamic sitting balance while 2nd helper assisted with LEs for threading limbs into pants. LE WB facilitated via step. Pt used reacher while using other limb to maintain balance due to posterior bias. Lateral leans Lt>Rt with pt lowering to elbows to elevate pants over hips with 2-3 assist. Pt returned to supine with 2 assist to pull pants up fully. Maxi transfer completed to w/c with pt able to complete recripricol scooting back in w/c with Mod A. Oral care and grooming tasks completed with setup after she self propelled w/c to sink. At end of session pt was left in w/c with all needs within reach. Tx focus placed on sitting balance, trunk control, and ADL retraining.    2nd Session 1:1 tx (36 min) Pt greeted in bed, verbalizing frustration regarding being unable to change position: "the bed is broken." OT retrieved assistance and with time, able to problem solve for changing pts bed position using bed controls. 2 assist for boosting her up with pt assisting with use of bedrails. When repositioned for comfort with HOB elevated, pt reported feeling too anxious to engage in UB exercises due to transport arriving soon for SNF transfer. Therefore, guided pt though diaphragmatic breathing exercises with use of lavender to use via inhalation (with pt consent). Pt visibly calmer after, with  decreased UE shakiness. She then reported need to urinate. Total A for urinal placement and hygiene. Pt also on menstrual cycle. 2 assist for rolling Rt>Lt to change soiled feminine pad and brief and for clothing mgt after. 2 assist for boosting pt back up and repositioning for comfort. Setup for hand hygiene. Transport team then arrived to take pt to next venue of care. Pt left in care of transport staff, 39 minutes missed.   Therapy Documentation Precautions:  Precautions Precautions: Back, Fall Precaution Comments: obese. No brace needed Restrictions Weight Bearing Restrictions: No Pain: in B LEs during repositioning at times. Pt able to direct care to minimize pain with functional movement/activity.  Pain Assessment Pain Scale: 0-10 Pain Score: 6  ADL:       Therapy/Group: Individual Therapy  Lailanie Hasley A Quiera Diffee 03/05/2019, 12:43 PM

## 2019-03-05 NOTE — Discharge Summary (Signed)
Occupational Therapy Discharge Summary  Patient Details  Name: Ellen Hill MRN: [id number] Date of Birth: 1971-09-26  Today's Date: 03/05/2019  Patient has met 3 of 7 long term goals due to improved activity tolerance, improved balance and ability to compensate for deficits.  Patient to discharge at overall 2 assist level. She will continue with occupational therapy services at next venue of care to further increase her functional independence.   Reasons goals not met: Pt unable to meet bathing, UB/LB dressing, and toilet transfer goals due to slow progress, body habitus, and paraplegia   Recommendation:  Patient will benefit from ongoing skilled OT services in skilled nursing facility setting to continue to advance functional skills in the area of BADL.  Equipment: No equipment provided  Reasons for discharge: discharge from hospital  Patient/family agrees with progress made and goals achieved: Yes  OT Discharge Precautions/Restrictions  Precautions Precautions: Back;Fall Precaution Comments: No brace needed Restrictions Weight Bearing Restrictions: No Pain Pain Assessment Pain Scale: 0-10 Pain Score: 6  ADL ADL Eating: Independent Where Assessed-Eating: Wheelchair Grooming: Setup Where Assessed-Grooming: Sitting at sink, Wheelchair Upper Body Bathing: Moderate assistance Where Assessed-Upper Body Bathing: (per staff report) Lower Body Bathing: Other (comment)(2 helpers) Where Assessed-Lower Body Bathing: Bed level Upper Body Dressing: Minimal assistance Where Assessed-Upper Body Dressing: Wheelchair Lower Body Dressing: Other (Comment)(2 helpers) Where Assessed-Lower Body Dressing: Bed level, Edge of bed Toileting: Other (Comment)(Total A) Where Assessed-Toileting: Bed level Toilet Transfer: Dependent(with use of Maxi Sky) Toilet Transfer Method: Other (comment)(mechanical lift) Science writer: Radiographer, therapeutic: Not  assessed Perception  Perception: Within Functional Limits Praxis Praxis: Intact Cognition Overall Cognitive Status: Within Functional Limits for tasks assessed Arousal/Alertness: Awake/alert Orientation Level: Oriented X4 Attention: Selective Sustained Attention: Appears intact Selective Attention: Appears intact Memory: Appears intact Awareness: Appears intact Problem Solving: Appears intact Safety/Judgment: Appears intact Sensation Sensation Light Touch: Impaired Detail Central sensation comments: impaired T9 and below Light Touch Impaired Details: Impaired RLE;Impaired LLE(improved since eval R>L) Gross Motor Movements are Fluid and Coordinated: No Fine Motor Movements are Fluid and Coordinated: Yes Coordination and Movement Description: impaired by weakness and paraplegia Motor  Motor Motor: Paraplegia;Abnormal tone;Abnormal postural alignment and control Motor - Skilled Clinical Observations: incomplete motor paraplegia below T9 Motor - Discharge Observations: incomplete motor paraplegia below T9 Mobility  Bed Mobility Supine to Sit: 2 Helpers Sit to Supine: 2 Helpers  Balance Dynamic sitting balance: Min A (during functional activity with 1 UE supported)- donning pants EOB using reacher Extremity/Trunk Assessment RUE Assessment RUE Assessment: Within Functional Limits LUE Assessment LUE Assessment: Within Functional Limits   Makhia Vosler A Becka Lagasse 03/05/2019, 2:59 PM

## 2019-03-05 NOTE — Progress Notes (Signed)
Social Work Patient ID: Ellen Hill, female   DOB: 01-16-1971, 48 y.o.   MRN: [id number] Spoke with [facility] they are prepared to take pt today. Informed pt and will arrange ambulance for 2:00 pm for pick up. Team aware and Dan-PA.

## 2019-03-05 NOTE — Progress Notes (Signed)
[place] PHYSICAL MEDICINE & REHABILITATION PROGRESS NOTE  Subjective/Complaints: Anxious about leaving.   ROS: Patient denies fever, rash, sore throat, blurred vision, nausea, vomiting, diarrhea, cough, shortness of breath or chest pain, joint or back pain, headache, or mood change.   Objective: Vital Signs: Blood pressure (!) 127/91, pulse (!) 103, temperature 98.4 F (36.9 C), resp. rate 19, height 5\' 6"  (1.676 m), weight (!) 174 kg, SpO2 100 %. No results found. No results for input(s): WBC, HGB, HCT, PLT in the last 72 hours. No results for input(s): NA, K, CL, CO2, GLUCOSE, BUN, CREATININE, CALCIUM in the last 72 hours.  Physical Exam: BP (!) 127/91 (BP Location: Right Wrist)   Pulse (!) 103   Temp 98.4 F (36.9 C)   Resp 19   Ht 5\' 6"  (1.676 m)   Wt (!) 174 kg   SpO2 100%   BMI 61.90 kg/m  Constitutional: No distress . Vital signs reviewed. obese HEENT: EOMI, oral membranes moist Neck: supple Cardiovascular: RRR without murmur. No JVD    Respiratory: CTA Bilaterally without wheezes or rales. Normal effort    GI: BS +, non-tender, non-distended  Skin: back incision with small speck of dry blood on dressing. No other visible drainage on wound itself. No dehiscence  Extremities: no edema Musc: No edema or tenderness in extremities. Neurological: She isalert and oriented. Motor:   RLE HF, KE 2/5, 3/5 ADF, no real changes LLE: 1/5 hip flexion and knee extension, moves toes. No changes Skin: warm Psychiatric: sl anxious  Assessment/Plan: 1. 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  Physiatrist is providing close team supervision and 24 hour management of active medical problems listed below.  Physiatrist and rehab team continue to assess barriers to discharge/monitor patient progress toward functional and medical  goals  Care Tool:  Bathing    Body parts bathed by patient: Right arm, Left arm, Chest, Abdomen, Face, Front perineal area, Buttocks   Body parts bathed by helper: Front perineal area, Buttocks, Right upper leg, Left upper leg, Right lower leg, Left lower leg     Bathing assist Assist Level: 2 Helpers     Upper Body Dressing/Undressing Upper body dressing   What is the patient wearing?: Pull over shirt    Upper body assist Assist Level: Minimal Assistance - Patient > 75%    Lower Body Dressing/Undressing Lower body dressing      What is the patient wearing?: Pants, Ace wrap/stump shrinker     Lower body assist Assist for lower body dressing: 2 Helpers     Toileting Toileting    Toileting assist Assist for toileting: 2 Helpers     Transfers Chair/bed transfer  Transfers assist     Chair/bed transfer assist level: Dependent - mechanical lift     Locomotion Ambulation   Ambulation assist   Ambulation activity did not occur: Safety/medical concerns          Walk 10 feet activity   Assist  Walk 10 feet activity did not occur: Safety/medical concerns        Walk 50 feet activity   Assist Walk 50 feet with 2 turns activity did not occur: Safety/medical concerns         Walk 150 feet activity   Assist Walk 150 feet activity did not occur: Safety/medical concerns         Walk 10 feet on uneven surface  activity   Assist Walk 10 feet on uneven surfaces activity did not occur: Safety/medical concerns         Wheelchair     Assist Will patient use wheelchair at discharge?: Yes Type of Wheelchair: Manual Wheelchair activity did not occur: Safety/medical concerns  Wheelchair assist level: Supervision/Verbal cueing Max wheelchair distance: 168ft    Wheelchair 50 feet with 2 turns activity    Assist    Wheelchair 50 feet with 2 turns activity did not occur: Safety/medical concerns   Assist Level: Supervision/Verbal  cueing   Wheelchair 150 feet activity     Assist Wheelchair 150 feet activity did not occur: Safety/medical concerns   Assist Level: Supervision/Verbal cueing      Medical Problem List and Plan: 1.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 No back brace required  -Continue CIR therapies including PT, OT   PRAFO LLE at HS  -SNF transfer potentially today 2. DVT Prophylaxis/Anticoagulation: SCDs.   Vascular study limited, but negative 3. Pain Management:Neurontin 300 mg 3 times a day---increased to 400mg  tid with benefit  - hydrocodone as needed  -prn baclofen   Patellofemoral pain: voltaren gel helpful  -treat menstrual pain supportively 4. Mood:Provide emotional support 5. Neuropsych: This patientiscapable of making decisions on herown behalf. 6. Skin/Wound Care: Dry dressing to back. Not concerned about drainage today which was minimal at best 7. Fluids/Electrolytes/Nutrition:Routine ins and outs  Have discussed better dietary choices with pt 8.Proteus/Escherichia coli UTI. Recurrent Neurogenic bladder Bactrim completed 2/12,    -repeat ucx positive---amoxil 250mg  bid for 7 days from 3/11 9.  Super obesity. Latest BMI 64.79. Dietary follow-up  - still some behavioral/diet choice issues going on here 10. Neurogenic bowel and bladder. Pt with GI discomfort today of unclear etiology  Flomax started on 2/8, peri-care ongoing   -continue senna-s at night  -hold miralax for now   -continue QOD bowel program with fleet enema, may need daily program again   -would like to see BM today 12.?  Anemia of chronic disease:   Hgb to 9.9 13.  Sleep disturbance: scheduled trazodone        LOS: 35 days A FACE TO FACE EVALUATION WAS PERFORMED  [name] 03/05/2019, 9:36 AM

## 2019-03-08 ENCOUNTER — Inpatient Hospital Stay (HOSPITAL_COMMUNITY): Payer: Self-pay | Admitting: Occupational Therapy

## 2019-03-08 LAB — URINE CULTURE: Culture: >=100000 — AB

## 2019-03-08 NOTE — Progress Notes (Signed)
Social Work Discharge Note  The overall goal for the admission was met for:   Discharge location: No - Pt required d/c to SNF - [facility]  Length of Stay: Yes - 35 days  Discharge activity level: No - min to max A, sometimes +2  Home/community participation: No  Services provided included: MD, RD, PT, OT, RN, Pharmacy, Neuropsych and SW  Financial Services: Private Insurance: [employer] [place] [employer]  Follow-up services arranged: Other: Pt required SNF prior to being able to go home with family to provide care.  Comments (or additional information):  Patient/Family verbalized understanding of follow-up arrangements: Yes  Individual responsible for coordination of the follow-up plan: Pt's sister, [name], is the main contact for follow up plan.  Confirmed correct DME delivered: Trey Sailors 03/08/2019    Lexis Potenza, Silvestre Mesi

## 2019-04-14 ENCOUNTER — Other Ambulatory Visit: Payer: Self-pay

## 2019-04-14 ENCOUNTER — Encounter: Payer: BC Managed Care – PPO | Admitting: Physical Medicine & Rehabilitation

## 2019-04-28 ENCOUNTER — Other Ambulatory Visit: Payer: Self-pay

## 2019-04-28 ENCOUNTER — Encounter
Payer: BC Managed Care – PPO | Attending: Physical Medicine & Rehabilitation | Admitting: Physical Medicine & Rehabilitation

## 2019-04-28 ENCOUNTER — Encounter: Payer: Self-pay | Admitting: Physical Medicine & Rehabilitation

## 2019-04-28 VITALS — BP 136/90 | HR 98 | Ht 66.0 in | Wt 373.0 lb

## 2019-04-28 DIAGNOSIS — M4714 Other spondylosis with myelopathy, thoracic region: Secondary | ICD-10-CM | POA: Diagnosis not present

## 2019-04-28 DIAGNOSIS — K592 Neurogenic bowel, not elsewhere classified: Secondary | ICD-10-CM

## 2019-04-28 NOTE — Progress Notes (Signed)
Subjective:    Patient ID: Ellen Hill, female    DOB: 1971/09/10, 48 y.o.   MRN: [id number]  HPI  Due to national recommendations of social distancing because of COVID 29, an audio/video tele-health visit is felt to be the most appropriate encounter for this patient at this time. See MyChart message from today for the patient's consent to a tele-health encounter with [facility]. This is a follow up telephone visit for the patient who is at home. MD is at office.    I am meeting with the patient today regarding her thoracic myelopathy.  She was discharged from inpatient rehab on March 13. She has been working on mobility with PT at the SNF. She is working on LE strength also.  I asked if she is doing transfers and she told me that the team is e using a hoyer for transfers. She has stood with standing frame for 2-3 seconds apparently on a few occasions. She has seen increased movement in the left ankle. She can twist her ankle and rotate her leg. The leg also can assist in wb now.  Her right leg is ahead of the left.   Her pain levels are reasonable. The back and left leg seemed to have improved although they are still tender with movement.   Her bowels are fairly regulated now with a laxative and morning suppository.  She seems to be regular with this.  Bladder has been an issue at times with recurrent urinary tract infection.  She is now clear.  She still has a Foley catheter in place.    Pain Inventory Average Pain 4 Pain Right Now 3 My pain is intermittent and sharp  In the last 24 hours, has pain interfered with the following? General activity 3 Relation with others 0 Enjoyment of life 3 What TIME of day is your pain at its worst? varies Sleep (in general) Good  Pain is worse with: inactivity Pain improves with: therapy/exercise and medication Relief from Meds: 5  Mobility use a wheelchair needs help with transfers  Function  disabled: date disabled na I need assistance with the following:  dressing, bathing, toileting, meal prep and in nursing home facility  Neuro/Psych weakness tremor  Prior Studies Any changes since last visit?  no  Physicians involved in your care In nursing home facility   Family History  Problem Relation Age of Onset  . Hypertension Mother   . Cancer Mother        Breast  . Breast cancer Mother   . Hypertension Father   . Cancer Father        Prostate  . Heart attack Father   . Hypertension Sister   . Diabetes Maternal Aunt    Social History   Socioeconomic History  . Marital status: Single    Spouse name: Not on file  . Number of children: Not on file  . Years of education: Not on file  . Highest education level: Not on file  Occupational History  . Not on file  Social Needs  . Financial resource strain: Not on file  . Food insecurity:    Worry: Not on file    Inability: Not on file  . Transportation needs:    Medical: Not on file    Non-medical: Not on file  Tobacco Use  . Smoking status: Former Smoker    Packs/day: 0.30    Years: 1.00    Pack years: 0.30  Types: Cigarettes    Last attempt to quit: 03/18/1999    Years since quitting: 20.1  . Smokeless tobacco: Never Used  Substance and Sexual Activity  . Alcohol use: No  . Drug use: No  . Sexual activity: Not Currently    Birth control/protection: Surgical  Lifestyle  . Physical activity:    Days per week: Not on file    Minutes per session: Not on file  . Stress: Not on file  Relationships  . Social connections:    Talks on phone: Not on file    Gets together: Not on file    Attends religious service: Not on file    Active member of club or organization: Not on file    Attends meetings of clubs or organizations: Not on file    Relationship status: Not on file  Other Topics Concern  . Not on file  Social History Narrative  . Not on file   Past Surgical History:  Procedure Laterality  Date  . CERVICAL POLYPECTOMY  05/01/2012   Procedure: CERVICAL POLYPECTOMY;  Surgeon: Betsy Coder, MD;  Location: [facility];  Service: Gynecology;  Laterality: N/A;  . CESAREAN SECTION  07/04/2007  . CHOLECYSTECTOMY  07/06/2013  . CHOLECYSTECTOMY N/A 07/06/2013   Procedure: LAPAROSCOPIC CHOLECYSTECTOMY ;  Surgeon: Gayland Curry, MD;  Location: [facility];  Service: General;  Laterality: N/A;  attempted cholangiogram  . fibroidecto    . fibroidectomy  07/04/2007  . LUMBAR LAMINECTOMY/DECOMPRESSION MICRODISCECTOMY N/A 01/19/2019   Procedure: Thoracic Nine-Lumbar One Decompressive Laminectomy;  Surgeon: Earnie Larsson, MD;  Location: [facility];  Service: Neurosurgery;  Laterality: N/A;  posterior  . MYOMECTOMY  07/04/08  . OPERATIVE HYSTEROSCOPY  05/01/2012   no menses since that time  . TUBAL LIGATION  07/04/2007   Past Medical History:  Diagnosis Date  . Abnormal Pap smear    history  . Anemia   . ASCUS (atypical squamous cells of undetermined significance) on Pap smear 2008   At 36wk of pregnancy ; colpo  . Asthma    rarely uses inhaler (seasonal)  . 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 Fibroids   . H/O candidiasis   . H/O fatigue 08/2007  . H/O varicella   . Headache(784.0)    migraines   . Heart murmur    dx child - no problems as adult  . Hx: UTI (urinary tract infection) 07/29/07  . Increased BMI   . Irregular bleeding   . Migraines   . Obesity 02/13/12  . Pelvic pain 02/13/12  . Postpartum anemia 08/18/07  . 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 after loss of parents, is fine  . Vaginal Pap smear, abnormal    f/u wnl   BP 136/90 Comment: pt reported, virtual visit  Pulse 98 Comment: pt reported, virtual visit  Ht 5\' 6"  (1.676 m)   Wt (!) 373 lb (169.2 kg)   SpO2 100% Comment: pt reported, virtual visit  BMI 60.20 kg/m   Opioid Risk Score:   Fall Risk Score:  `1  Depression screen PHQ 2/9  Depression screen [facility] 2/9 04/28/2019 07/21/2017 05/13/2017 05/13/2017  Decreased Interest 1 3 3 3    Down, Depressed, Hopeless 1 1 1 1   PHQ - 2 Score 2 4 4 4   Altered sleeping - 2 3 -  Tired, decreased energy - 2 3 -  Change in appetite - 2 3 -  Feeling bad or failure about yourself  - 1 1 -  Trouble concentrating -  0 1 -  Moving slowly or fidgety/restless - 0 0 -  Suicidal thoughts - 0 0 -  PHQ-9 Score - 11 15 -   Review of Systems  Constitutional: Positive for chills and unexpected weight change.  HENT: Negative.   Eyes: Negative.   Respiratory: Negative.   Cardiovascular: Negative.   Gastrointestinal: Positive for nausea.  Endocrine: Negative.   Genitourinary: Negative.   Musculoskeletal: Positive for gait problem.       Leg pain  Skin: Positive for rash.  Allergic/Immunologic: Negative.   Neurological: Positive for tremors and weakness.  Hematological: Negative.   Psychiatric/Behavioral: Negative.   All other systems reviewed and are negative.            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              -continue PT, OT at SNF  -Her focus needs to be more on transfers in order to make an effort to get her home.  It sounds as if she still has a bit of work to do.  She does appear to be making progress with her motor skills however. 2. DVT Prophylaxis/Anticoagulation: SCDs.              Vascular study limited, but negative 3. Pain Management:Neurontin for neuropathic pain.  I do not have a list of her active medications at the facility.  Overall pain seems improved. 4. Mood:seems pretty positive.  5 Super obesity.  Diet has been reviewed previously 10. Neurogenic bowel and bladder.  Continue with bowel program including senna and Dulcolax suppository. Continue Foley catheter and perineal hygiene.   I spent 11 minutes on the phone with the patient reviewing regarding her medical concerns and status.  Made recommendations as  appropriate.  I will see her back here in about 6 to 8 weeks.

## 2019-05-18 ENCOUNTER — Ambulatory Visit: Payer: BC Managed Care – PPO | Admitting: Sports Medicine

## 2019-06-01 ENCOUNTER — Ambulatory Visit: Payer: BC Managed Care – PPO | Admitting: Sports Medicine

## 2019-06-07 ENCOUNTER — Ambulatory Visit (INDEPENDENT_AMBULATORY_CARE_PROVIDER_SITE_OTHER): Payer: Medicaid Other | Admitting: Podiatry

## 2019-06-07 ENCOUNTER — Telehealth: Payer: Self-pay | Admitting: *Deleted

## 2019-06-07 ENCOUNTER — Other Ambulatory Visit: Payer: Self-pay

## 2019-06-07 ENCOUNTER — Encounter: Payer: Self-pay | Admitting: Podiatry

## 2019-06-07 VITALS — Temp 97.5°F

## 2019-06-07 DIAGNOSIS — L600 Ingrowing nail: Secondary | ICD-10-CM | POA: Diagnosis not present

## 2019-06-07 DIAGNOSIS — M79674 Pain in right toe(s): Secondary | ICD-10-CM | POA: Diagnosis not present

## 2019-06-07 DIAGNOSIS — B351 Tinea unguium: Secondary | ICD-10-CM | POA: Diagnosis not present

## 2019-06-07 DIAGNOSIS — M79675 Pain in left toe(s): Secondary | ICD-10-CM

## 2019-06-07 MED ORDER — NEOMYCIN-POLYMYXIN-HC 3.5-10000-1 OT SOLN: OTIC | 0 refills | Status: DC

## 2019-06-07 NOTE — Telephone Encounter (Signed)
[facility] asked for a hard copy of the prescription, fax to [phone]. Orders faxed to [facility].

## 2019-06-07 NOTE — Progress Notes (Signed)
   Subjective:    Patient ID: Ellen Hill, female    DOB: 01/29/1971, 48 y.o.   MRN: [id number]  HPI    Review of Systems  All other systems reviewed and are negative.      Objective:   Physical Exam        Assessment & Plan:

## 2019-06-07 NOTE — Patient Instructions (Signed)

## 2019-06-08 NOTE — Progress Notes (Signed)
Subjective:   Patient ID: Ellen Hill, female   DOB: 48 y.o.   MRN: [id number]   HPI Patient presents with a damaged second nail left is been bothersome and she cannot take care of and all nails that are incurvated and thickened with patient being obese and having had history of back issues with failed back surgery and is currently in wheelchair.  Presents with caregiver   Review of Systems  All other systems reviewed and are negative.       Objective:  Physical Exam Vitals signs and nursing note reviewed.  Constitutional:      Appearance: She is well-developed.  Pulmonary:     Effort: Pulmonary effort is normal.  Musculoskeletal: Normal range of motion.  Skin:    General: Skin is warm.  Neurological:     Mental Status: She is alert.     Neurovascular status was found to be intact muscle strength was found to be adequate range of motion was within normal limits.  Patient does have a damaged thickened incurvated second nail left that is painful and has elongated nailbeds 1-5 both feet that she cannot cut and she has tried to take care of with extreme obesity and immobilization is complicating factors.  Patient has good distal perfusion well oriented x3     Assessment:  Ingrown damage second nail left with mycotic nail infection 1-5 both feet     Plan:  H&P both conditions discussed and discussed nail removal explaining procedure risk.  Patient wants surgery and today I went ahead and infiltrated the left second digit 60 mg like Marcaine mixture sterile prep applied to the toe and using sterile instrumentation I remove the nail exposed matrix and applied phenol 3 applications 30 seconds followed by alcohol lavage and sterile dressing.  Gave instructions on soaks and reappoint to recheck and debrided nailbeds 1-5 both feet with no iatrogenic bleeding noted

## 2019-07-07 ENCOUNTER — Encounter: Payer: BC Managed Care – PPO | Admitting: Physical Medicine & Rehabilitation

## 2019-08-04 ENCOUNTER — Other Ambulatory Visit: Payer: Self-pay

## 2019-08-04 ENCOUNTER — Emergency Department (HOSPITAL_COMMUNITY)
Admission: EM | Admit: 2019-08-04 | Discharge: 2019-08-04 | Disposition: A | Discharge status: Home / Self Care | Payer: Medicaid Other | Attending: Emergency Medicine | Admitting: Emergency Medicine

## 2019-08-04 DIAGNOSIS — Z5189 Encounter for other specified aftercare: Secondary | ICD-10-CM

## 2019-08-04 DIAGNOSIS — Z87891 Personal history of nicotine dependence: Secondary | ICD-10-CM | POA: Diagnosis not present

## 2019-08-04 DIAGNOSIS — Z79899 Other long term (current) drug therapy: Secondary | ICD-10-CM | POA: Insufficient documentation

## 2019-08-04 DIAGNOSIS — Z48817 Encounter for surgical aftercare following surgery on the skin and subcutaneous tissue: Secondary | ICD-10-CM | POA: Diagnosis present

## 2019-08-04 MED ORDER — IBUPROFEN 800 MG PO TABS
800.0000 mg | ORAL_TABLET | Freq: Once | ORAL | Status: AC
  Administered 2019-08-04: 18:00:00 800 mg via ORAL
  Filled 2019-08-04: qty 1

## 2019-08-04 NOTE — ED Notes (Signed)
Pt c/o headache (8/10) and asks for some pain meds.

## 2019-08-04 NOTE — ED Triage Notes (Signed)
Pt brought in by ems from [place] rehab for further evaluation of wound post Incision and drainage  this morning ; facility states that they thought it was an abscess but wasn't sure due to the texture so  they sent the patient here for further evaluation ; "specimen" at bedside ; pt c/o headache post drainage; ems vitals : 150/100 16RR 98%rRA; pt alert and oriented x 4

## 2019-08-04 NOTE — ED Provider Notes (Signed)
I saw and evaluated the patient, reviewed the resident's note and I agree with the findings and plan.  EKG:   Patient is sent from [facility] rehab for further evaluation of a incision and drainage done earlier today.  Patient was status post laminectomy from T9-L1 by Dr. Trenton Gammon [phone].  The wound had healed and had a reportedly fluid-filled area that they drained today.  They have sent the patient with the fluid specimen and a container cup.  Patient denies that she was having any problems with the wound or any decline in her rehabilitation.  She reports that she is still been weak and is only now progressing to probable rehab for ambulation.  There however has not been any decreases in function any general illness any fevers or focal pain.  She has started to however noticed that there was some bloody drainage present.  Patient is alert and appropriate.  Mental status is clear.  Examination of the wound shows a well-healed approximately 10 cm wound that is completely healed and scarred all of the top portion down to the caudal end of the wound which is now open approximately 1 cm with a small piece of packing present in it.  The drainage from this on the gauze is blood-tinged.  No erythema is surrounding the wound.  The contents of the specimen cup are serosanguineous with clear appearance and slight red blood tinge proximate equivalent of pale red Kool-Aid.  At this time I do not suspect patient has an abscess.  This is clinically consistent with a seroma.  Patient does not describe worsening pain or decreased function.  The wound generally does not have appearance of being infected.  Recommendation will be for very close follow-up with Dr. Trenton Gammon for wound recheck and continue dressing changes at the facility.   Charlesetta Shanks, MD 08/04/19 1721

## 2019-08-04 NOTE — Discharge Instructions (Addendum)
Follow up closely with Dr. Annette Stable.  Have dressing changed daily at your facility.  There is no sign of infection at this time.  If you develop redness, drainage of pus, or firmness to the area then you may need to be started on antibiotics.

## 2019-08-04 NOTE — ED Notes (Signed)
Called ptar at 17:38

## 2019-08-04 NOTE — ED Provider Notes (Signed)
[facility] EMERGENCY DEPARTMENT Provider Note   CSN: [id number] Arrival date & time: 08/04/19  1549    History   Chief Complaint Chief Complaint  Patient presents with  . Wound Check    HPI Ellen Hill is a 48 y.o. female presenting to the ED via EMS from [facility] for wound check.  Patient had a T9-L1 laminectomy by Dr. Annette Stable on 01/19/2019 for cord compression causing thoracic myelopathy.  Patient reports that she recently developed an area of swelling at the base of her surgical site and reports that there was some spontaneous bloody drainage from the area.  She reports that earlier today the physician at her rehab facility incised and drained the collection after an ultrasound showed a pocket of fluid.  She reports that she was told this could be an abscess or hematoma.  She reports that after drainage, they sent patient to the ED to have the wound evaluated.  The facility sent a sample of the drainage from the wound with the patient.  Patient denies any significant pain to the wound and denies any redness or warmth to the area.  Patient reports that since her drainage today she has developed some neck pain and headache.  Patient reports that her rehab has been progressing and she has regained feeling in her legs.  She denies any recent fever, chills, cough, shortness of breath, abdominal pain, nausea, vomiting, diarrhea, or any other complaints.     HPI  Past Medical History:  Diagnosis Date  . Abnormal Pap smear    history  . Anemia   . ASCUS (atypical squamous cells of undetermined significance) on Pap smear 2008   At 36wk of pregnancy ; colpo  . Asthma    rarely uses inhaler (seasonal)  . 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 Fibroids   . H/O candidiasis   . H/O fatigue 08/2007  . H/O varicella   . Headache(784.0)    migraines   . Heart murmur    dx child - no problems as adult  . Hx: UTI (urinary tract infection) 07/29/07  . Increased BMI   .  Irregular bleeding   . Migraines   . Obesity 02/13/12  . Pelvic pain 02/13/12  . Postpartum anemia 08/18/07  . 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 after loss of parents, is fine  . Vaginal Pap smear, abnormal    f/u wnl    Patient Active Problem List   Diagnosis Date Noted  . Dysuria   . Super obesity   . Anemia of chronic disease   . Leukocytosis   . Neurogenic bowel   . Neurogenic bladder   . E. coli UTI   . Myelopathy ([place]) 01/29/2019  . 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 Lumbar back pain with radiculopathy affecting lower extremity 12/08/2018  . Dysmenorrhea 04/13/2012  . Class 3 severe obesity due to excess calories without serious comorbidity with body mass index (BMI) of 60.0 to 69.9 in adult ([place]) 04/13/2012  . Anemia 04/13/2012  . Fibroids 04/13/2012  . Menorrhagia 03/07/2012    Past Surgical History:  Procedure Laterality Date  . CERVICAL POLYPECTOMY  05/01/2012   Procedure: CERVICAL POLYPECTOMY;  Surgeon: Betsy Coder, MD;  Location: [facility];  Service: Gynecology;  Laterality: N/A;  . CESAREAN SECTION  07/04/2007  . CHOLECYSTECTOMY  07/06/2013  . CHOLECYSTECTOMY N/A 07/06/2013   Procedure: LAPAROSCOPIC CHOLECYSTECTOMY ;  Surgeon: Gayland Curry, MD;  Location: [facility];  Service: General;  Laterality: N/A;  attempted cholangiogram  . fibroidecto    . fibroidectomy  07/04/2007  . LUMBAR LAMINECTOMY/DECOMPRESSION MICRODISCECTOMY N/A 01/19/2019   Procedure: Thoracic Nine-Lumbar One Decompressive Laminectomy;  Surgeon: Earnie Larsson, MD;  Location: [facility];  Service: Neurosurgery;  Laterality: N/A;  posterior  . MYOMECTOMY  07/04/08  . OPERATIVE HYSTEROSCOPY  05/01/2012   no menses since that time  . TUBAL LIGATION  07/04/2007     OB History    Gravida  4   Para  1   Term  0   Preterm  1   AB  3   Living  1     SAB  0   TAB  3   Ectopic  0   Multiple  0   Live Births  1            Home Medications    Prior to Admission medications   Medication Sig  Start Date End Date Taking? Authorizing Provider  acetaminophen (TYLENOL) 325 MG tablet Take 2 tablets (650 mg total) by mouth every 4 (four) hours as needed for mild pain ((score 1 to 3) or temp > 100.5). Patient taking differently: Take 650 mg by mouth every 6 (six) hours as needed for mild pain ((score 1 to 3) or temp > 100.5).  03/05/19  Yes Angiulli, Lavon Paganini, PA-C  baclofen (LIORESAL) 10 MG tablet Take 1 tablet (10 mg total) by mouth every 8 (eight) hours as needed for muscle spasms. Patient taking differently: Take 15 mg by mouth 2 (two) times daily.  03/05/19  Yes Angiulli, Lavon Paganini, PA-C  baclofen (LIORESAL) 20 MG tablet Take 20 mg by mouth every evening.   Yes [provider]  ferrous sulfate 325 (65 FE) MG tablet Take 325 mg by mouth every evening.   Yes [provider]  gabapentin (NEURONTIN) 400 MG capsule Take 1 capsule (400 mg total) by mouth 3 (three) times daily. 03/05/19  Yes Angiulli, Lavon Paganini, PA-C  hydrochlorothiazide (HYDRODIURIL) 25 MG tablet Take 25 mg by mouth daily.   Yes [provider]  HYDROcodone-acetaminophen (NORCO) 10-325 MG tablet Take 1 tablet by mouth 3 (three) times daily as needed for moderate pain.   Yes [provider]  lactulose (CHRONULAC) 10 GM/15ML solution Take 20 g by mouth 2 (two) times daily.   Yes [provider]  magnesium oxide (MAG-OX) 400 MG tablet Take 400 mg by mouth every evening.   Yes [provider]  polyethylene glycol (MIRALAX / GLYCOLAX) 17 g packet Take 17 g by mouth daily as needed for mild constipation.   Yes [provider]  potassium chloride (K-DUR) 10 MEQ tablet Take 10 mEq by mouth 2 (two) times daily.   Yes [provider]  senna-docusate (SENOKOT-S) 8.6-50 MG tablet Take 2 tablets by mouth at bedtime. Patient taking differently: Take 2 tablets by mouth at bedtime as needed for mild constipation.  03/05/19  Yes Angiulli, Lavon Paganini, PA-C  tamsulosin (FLOMAX) 0.4 MG  CAPS capsule Take 1 capsule (0.4 mg total) by mouth daily after supper. 03/05/19  Yes Angiulli, Lavon Paganini, PA-C  traZODone (DESYREL) 50 MG tablet Take 1 tablet (50 mg total) by mouth at bedtime. 03/05/19  Yes Angiulli, Lavon Paganini, PA-C  Trolamine Salicylate (ASPERCREME EX) Apply 1 application topically 4 (four) times daily.   Yes [provider]  vitamin C (ASCORBIC ACID) 500 MG tablet Take 500 mg by mouth every evening.   Yes [provider]  diclofenac  sodium (VOLTAREN) 1 % GEL Apply 2 g topically 4 (four) times daily. Patient not taking: Reported on 08/04/2019 03/05/19   Angiulli, Lavon Paganini, PA-C  HYDROcodone-acetaminophen (NORCO/VICODIN) 5-325 MG tablet Take 1-2 tablets by mouth every 4 (four) hours as needed for moderate pain. Patient not taking: Reported on 08/04/2019 03/05/19   Angiulli, Lavon Paganini, PA-C  iron polysaccharides (NIFEREX) 150 MG capsule Take 1 capsule (150 mg total) by mouth daily. Patient not taking: Reported on 08/04/2019 03/05/19   Angiulli, Lavon Paganini, PA-C  sodium phosphate (FLEET) 7-19 GM/118ML ENEM Place 133 mLs (1 enema total) rectally daily at 6 (six) AM. Patient not taking: Reported on 08/04/2019 03/05/19   Angiulli, Lavon Paganini, PA-C    Family History Family History  Problem Relation Age of Onset  . Hypertension Mother   . Cancer Mother        Breast  . Breast cancer Mother   . Hypertension Father   . Cancer Father        Prostate  . Heart attack Father   . Hypertension Sister   . Diabetes Maternal Aunt     Social History Social History   Tobacco Use  . Smoking status: Former Smoker    Packs/day: 0.30    Years: 1.00    Pack years: 0.30    Types: Cigarettes    Quit date: 03/18/1999    Years since quitting: 20.3  . Smokeless tobacco: Never Used  Substance Use Topics  . Alcohol use: No  . Drug use: No     Allergies   Patient has no known allergies.   Review of Systems Review of Systems  Constitutional: Negative for chills and fever.   HENT: Negative for ear pain and sore throat.   Eyes: Negative for pain and visual disturbance.  Respiratory: Negative for cough and shortness of breath.   Cardiovascular: Negative for chest pain and palpitations.  Gastrointestinal: Negative for abdominal pain and vomiting.  Genitourinary: Negative for dysuria and hematuria.  Musculoskeletal: Positive for neck pain. Negative for arthralgias and back pain.  Skin: Positive for wound. Negative for color change and rash.  Neurological: Positive for headaches. Negative for seizures and syncope.  All other systems reviewed and are negative.    Physical Exam Updated Vital Signs BP (!) 139/96 (BP Location: Right Arm)   Pulse 88   Temp 98.6 F (37 C) (Oral)   Resp 16   SpO2 98%   Physical Exam Vitals signs and nursing note reviewed.  Constitutional:      General: She is not in acute distress.    Appearance: She is obese. She is not ill-appearing, toxic-appearing or diaphoretic.  HENT:     Head: Normocephalic and atraumatic.  Eyes:     Extraocular Movements: Extraocular movements intact.     Conjunctiva/sclera: Conjunctivae normal.     Pupils: Pupils are equal, round, and reactive to light.  Neck:     Musculoskeletal: Normal range of motion and neck supple. No neck rigidity or muscular tenderness.  Cardiovascular:     Rate and Rhythm: Normal rate and regular rhythm.     Pulses: Normal pulses.     Heart sounds: Normal heart sounds. No murmur. No friction rub. No gallop.   Pulmonary:     Effort: Pulmonary effort is normal. No respiratory distress.     Breath sounds: Normal breath sounds. No stridor. No wheezing, rhonchi or rales.  Chest:     Chest wall: No tenderness.  Abdominal:     General:  Abdomen is flat. There is no distension.     Palpations: Abdomen is soft.     Tenderness: There is no abdominal tenderness. There is no guarding or rebound.  Musculoskeletal: Normal range of motion.        General: No swelling, tenderness,  deformity or signs of injury.  Skin:    General: Skin is warm and dry.     Comments: Midline T-spine scar is well-healed.  At the inferior portion of the wound there is a small, 0.5 cm wound with packing in place.  There is no surrounding redness, induration, or warmth.  Neurological:     Mental Status: She is alert and oriented to person, place, and time. Mental status is at baseline.     Cranial Nerves: No cranial nerve deficit.     Sensory: No sensory deficit.  Psychiatric:        Mood and Affect: Mood normal.        Behavior: Behavior normal.      ED Treatments / Results  Labs (all labs ordered are listed, but only abnormal results are displayed) Labs Reviewed - No data to display  EKG None  Radiology No results found.  Procedures Procedures (including critical care time)  Medications Ordered in ED Medications  ibuprofen (ADVIL) tablet 800 mg (800 mg Oral Given 08/04/19 1802)     Initial Impression / Assessment and Plan / ED Course  I have reviewed the triage vital signs and the nursing notes.  Pertinent labs & imaging results that were available during my care of the patient were reviewed by me and considered in my medical decision making (see chart for details).        Ellen Hill is a 48 y.o. female presenting to the ED via EMS from [facility] for wound check after an I&D earlier today of an area of swelling at the base of her thoracic spine incision.  The fluid drained from the wound was sent with patient to the ED and appears serosanguineous in nature.  Patient's presentation seems consistent with a postoperative seroma.  There are no signs of wound infection on exam.  Patient was discharged back to her facility in stable condition.  She was advised to follow-up with her neurosurgeon and to have her dressing changed daily at her facility.  She was advised to return to the ED if she develops any signs of infection to the wound.   Final Clinical  Impressions(s) / ED Diagnoses   Final diagnoses:  Visit for wound check    ED Discharge Orders    None       Candie Chroman, MD 08/05/19 Sheryle Hail, MD 08/14/19 2221

## 2019-08-17 ENCOUNTER — Telehealth: Payer: Self-pay | Admitting: Family Medicine

## 2019-08-17 NOTE — Telephone Encounter (Signed)
Caller/Agency: [facility]   Requesting PT OT  Skilled nursing  And [facility] aide   Eval only.

## 2019-08-19 NOTE — Telephone Encounter (Signed)
Ok for verbal orders ?

## 2019-08-20 NOTE — Telephone Encounter (Signed)
ok 

## 2019-08-20 NOTE — Telephone Encounter (Signed)
Verbal orders given to Smiley with [facility]

## 2019-08-24 ENCOUNTER — Other Ambulatory Visit: Payer: Self-pay

## 2019-08-24 ENCOUNTER — Telehealth (INDEPENDENT_AMBULATORY_CARE_PROVIDER_SITE_OTHER): Payer: Medicaid Other | Admitting: Family Medicine

## 2019-08-24 DIAGNOSIS — M4714 Other spondylosis with myelopathy, thoracic region: Secondary | ICD-10-CM | POA: Diagnosis not present

## 2019-08-24 DIAGNOSIS — K592 Neurogenic bowel, not elsewhere classified: Secondary | ICD-10-CM

## 2019-08-24 DIAGNOSIS — Z9889 Other specified postprocedural states: Secondary | ICD-10-CM | POA: Diagnosis not present

## 2019-08-24 DIAGNOSIS — N319 Neuromuscular dysfunction of bladder, unspecified: Secondary | ICD-10-CM

## 2019-08-24 NOTE — Progress Notes (Signed)
Virtual Visit via Video Note  I connected with Ellen Hill on 08/24/19 at  1:00 PM EDT by a video enabled telemedicine application 2/2 [id number] pandemic and verified that I am speaking with the correct person using two identifiers.  Location patient: home Location provider:work or home office Persons participating in the virtual visit: patient, provider  I discussed the limitations of evaluation and management by telemedicine and the availability of in person appointments. The patient expressed understanding and agreed to proceed.   HPI: Pt is a 48 yo female with pmh sig for thoracic myelopathy s/p laminectomy, migraines, asthma, obesity.  Pt has been in rehab since March, she just got discharge home.  Pt now has feeling back in her legs but is not strong enough to stand or walk.  PT came out for eval today.  Pt's sister and daughter are at the house with her.  Church members come over throughout the day.  Pt's visitors check her bp, 136/88 has been the highest.  Pt has f/u with neurosurgery on Thursday.   Pt inquires about an IT trainer wheelchair.  Pt notes 70 lb weight loss.  States eating less than she was prior to surgery.  Pt also notes food taste different.  Updated meds: tamsulosin .4 mg daily Baclofen 10 mg BID HCTZ 25 mg Klor con 10 meq BID gabapenin 400 mg TID trazadone 50 qhs Nystatin powder Lactulose 15 ml BID   ROS: See pertinent positives and negatives per HPI.  Past Medical History:  Diagnosis Date  . Abnormal Pap smear    history  . Anemia   . ASCUS (atypical squamous cells of undetermined significance) on Pap smear 2008   At 36wk of pregnancy ; colpo  . Asthma    rarely uses inhaler (seasonal)  . 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 Fibroids   . H/O candidiasis   . H/O fatigue 08/2007  . H/O varicella   . Headache(784.0)    migraines   . Heart murmur    dx child - no problems as adult  . Hx: UTI (urinary tract infection) 07/29/07  . Increased BMI   .  Irregular bleeding   . Migraines   . Obesity 02/13/12  . Pelvic pain 02/13/12  . Postpartum anemia 08/18/07  . 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 after loss of parents, is fine  . Vaginal Pap smear, abnormal    f/u wnl    Past Surgical History:  Procedure Laterality Date  . CERVICAL POLYPECTOMY  05/01/2012   Procedure: CERVICAL POLYPECTOMY;  Surgeon: Betsy Coder, MD;  Location: [facility];  Service: Gynecology;  Laterality: N/A;  . CESAREAN SECTION  07/04/2007  . CHOLECYSTECTOMY  07/06/2013  . CHOLECYSTECTOMY N/A 07/06/2013   Procedure: LAPAROSCOPIC CHOLECYSTECTOMY ;  Surgeon: Gayland Curry, MD;  Location: [facility];  Service: General;  Laterality: N/A;  attempted cholangiogram  . fibroidecto    . fibroidectomy  07/04/2007  . LUMBAR LAMINECTOMY/DECOMPRESSION MICRODISCECTOMY N/A 01/19/2019   Procedure: Thoracic Nine-Lumbar One Decompressive Laminectomy;  Surgeon: Earnie Larsson, MD;  Location: [facility];  Service: Neurosurgery;  Laterality: N/A;  posterior  . MYOMECTOMY  07/04/08  . OPERATIVE HYSTEROSCOPY  05/01/2012   no menses since that time  . TUBAL LIGATION  07/04/2007    Family History  Problem Relation Age of Onset  . Hypertension Mother   . Cancer Mother        Breast  . Breast cancer Mother   . Hypertension Father   . Cancer  Father        Prostate  . Heart attack Father   . Hypertension Sister   . Diabetes Maternal Aunt     SOCIAL HX: pt worked at the [employer].   Current Outpatient Medications:  .  acetaminophen (TYLENOL) 325 MG tablet, Take 2 tablets (650 mg total) by mouth every 4 (four) hours as needed for mild pain ((score 1 to 3) or temp > 100.5). (Patient taking differently: Take 650 mg by mouth every 6 (six) hours as needed for mild pain ((score 1 to 3) or temp > 100.5). ), Disp: , Rfl:  .  baclofen (LIORESAL) 10 MG tablet, Take 1 tablet (10 mg total) by mouth every 8 (eight) hours as needed for muscle spasms. (Patient taking differently: Take 15 mg by mouth 2 (two) times daily. ),  Disp: 30 each, Rfl: 0 .  baclofen (LIORESAL) 20 MG tablet, Take 20 mg by mouth every evening., Disp: , Rfl:  .  diclofenac sodium (VOLTAREN) 1 % GEL, Apply 2 g topically 4 (four) times daily. (Patient not taking: Reported on 08/04/2019), Disp: , Rfl:  .  ferrous sulfate 325 (65 FE) MG tablet, Take 325 mg by mouth every evening., Disp: , Rfl:  .  gabapentin (NEURONTIN) 400 MG capsule, Take 1 capsule (400 mg total) by mouth 3 (three) times daily., Disp: , Rfl:  .  hydrochlorothiazide (HYDRODIURIL) 25 MG tablet, Take 25 mg by mouth daily., Disp: , Rfl:  .  HYDROcodone-acetaminophen (NORCO) 10-325 MG tablet, Take 1 tablet by mouth 3 (three) times daily as needed for moderate pain., Disp: , Rfl:  .  HYDROcodone-acetaminophen (NORCO/VICODIN) 5-325 MG tablet, Take 1-2 tablets by mouth every 4 (four) hours as needed for moderate pain. (Patient not taking: Reported on 08/04/2019), Disp: 10 tablet, Rfl: 0 .  iron polysaccharides (NIFEREX) 150 MG capsule, Take 1 capsule (150 mg total) by mouth daily. (Patient not taking: Reported on 08/04/2019), Disp: , Rfl:  .  lactulose (CHRONULAC) 10 GM/15ML solution, Take 20 g by mouth 2 (two) times daily., Disp: , Rfl:  .  magnesium oxide (MAG-OX) 400 MG tablet, Take 400 mg by mouth every evening., Disp: , Rfl:  .  polyethylene glycol (MIRALAX / GLYCOLAX) 17 g packet, Take 17 g by mouth daily as needed for mild constipation., Disp: , Rfl:  .  potassium chloride (K-DUR) 10 MEQ tablet, Take 10 mEq by mouth 2 (two) times daily., Disp: , Rfl:  .  senna-docusate (SENOKOT-S) 8.6-50 MG tablet, Take 2 tablets by mouth at bedtime. (Patient taking differently: Take 2 tablets by mouth at bedtime as needed for mild constipation. ), Disp: , Rfl:  .  sodium phosphate (FLEET) 7-19 GM/118ML ENEM, Place 133 mLs (1 enema total) rectally daily at 6 (six) AM. (Patient not taking: Reported on 08/04/2019), Disp: , Rfl: 0 .  tamsulosin (FLOMAX) 0.4 MG CAPS capsule, Take 1 capsule (0.4 mg total) by  mouth daily after supper., Disp: 30 capsule, Rfl:  .  traZODone (DESYREL) 50 MG tablet, Take 1 tablet (50 mg total) by mouth at bedtime., Disp: 3 tablet, Rfl: 0 .  Trolamine Salicylate (ASPERCREME EX), Apply 1 application topically 4 (four) times daily., Disp: , Rfl:  .  vitamin C (ASCORBIC ACID) 500 MG tablet, Take 500 mg by mouth every evening., Disp: , Rfl:   EXAM:  VITALS per patient if applicable:  GENERAL: alert, oriented, appears well and in no acute distress.    HEENT: atraumatic, conjunctiva clear, no obvious abnormalities on inspection  of external nose and ears  NECK: normal movements of the head and neck  LUNGS: on inspection no signs of respiratory distress, breathing rate appears normal, no obvious gross SOB, gasping or wheezing  CV: no obvious cyanosis  MS: moves all visible extremities without noticeable abnormality  PSYCH/NEURO: pleasant and cooperative, no obvious depression or anxiety, speech and thought processing grossly intact  ASSESSMENT AND PLAN:  Discussed the following assessment and plan:  S/P laminectomy  -improving.  Feeling in LEs returning. -encouraged to keep f/u with Neurosurgery -Continue [facility].  Pt reminded that it will take time to get back to where she wants to be physically, encouraged not to get discouraged. - Plan: Ambulatory referral to Physical Medicine Rehab  Thoracic myelopathy  -continue gabapenin and baclofen - Plan: Ambulatory referral to Physical Medicine Rehab  Neurogenic bowel  -continue lactulose and baclofen - Plan: Ambulatory referral to Physical Medicine Rehab  Neurogenic bladder  -continue tamulosin and baclofen - Plan: Ambulatory referral to Physical Medicine Rehab  F/u in 1 month   I discussed the assessment and treatment plan with the patient. The patient was provided an opportunity to ask questions and all were answered. The patient agreed with the plan and demonstrated an understanding of the instructions.    The patient was advised to call back or seek an in-person evaluation if the symptoms worsen or if the condition fails to improve as anticipated.   Billie Ruddy, MD

## 2019-08-29 IMAGING — MR MR LUMBAR SPINE W/O CM
4 of 5 series · 24 of 48 positions shown · non-contrast
Comparison: None.

CLINICAL DATA: Low back pain extending to both legs with numbness,
4 weeks duration.

EXAM:
MRI LUMBAR SPINE WITHOUT CONTRAST
TECHNIQUE: Multiplanar, multisequence MR imaging of the lumbar spine was
performed. No intravenous contrast was administered.

[Series 4: T2 post-contrast · sagittal · 4.0mm · 0.55mm/px · 6 of 13 slices shown]
[im 1/13]
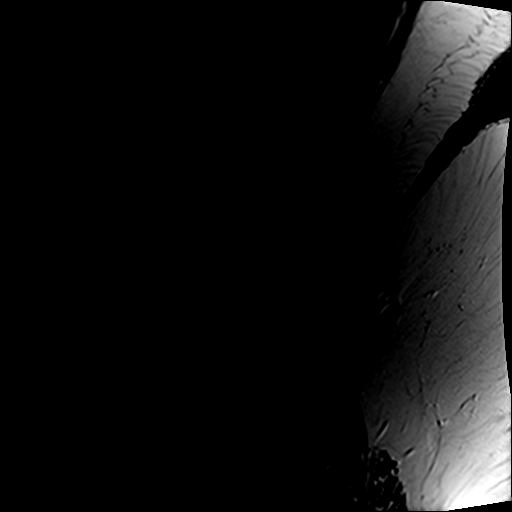
[im 3/13]
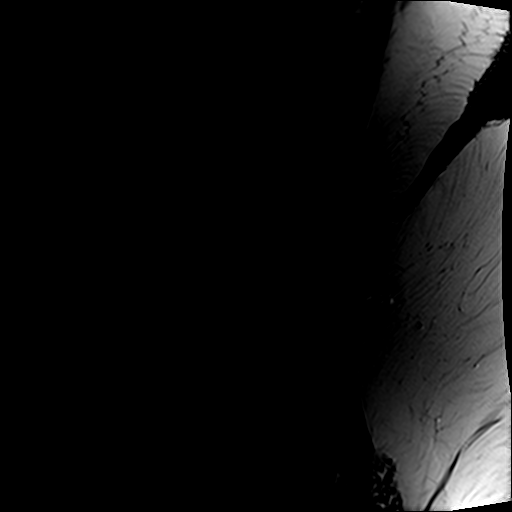
[im 5/13]
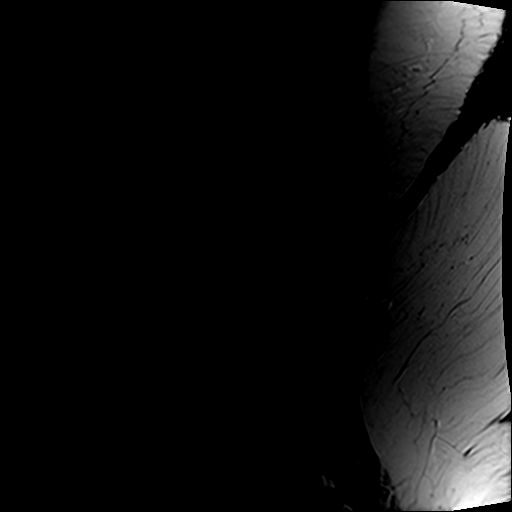
[im 8/13]
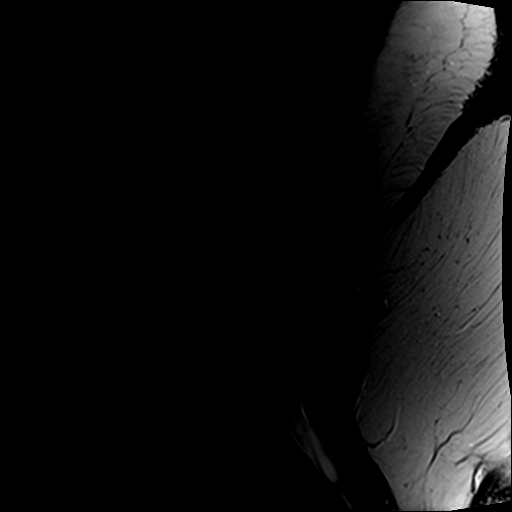
[im 10/13]
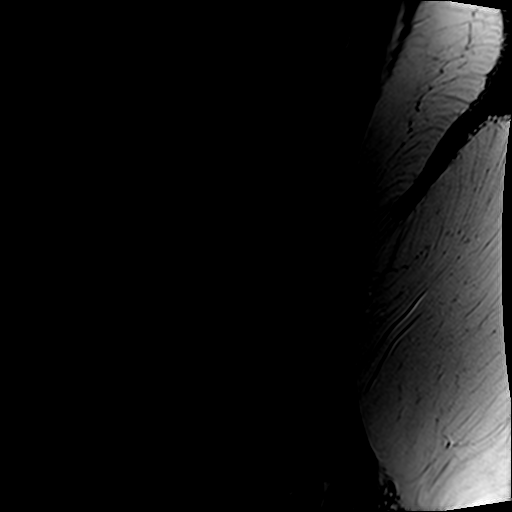
[im 13/13]
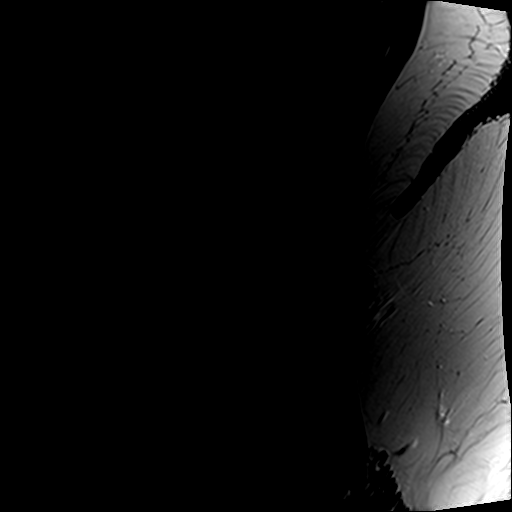

[Series 6: T1 · sagittal · 4.0mm · 0.55mm/px · 6 of 13 slices shown (1 of 2)]
[im 1/13]
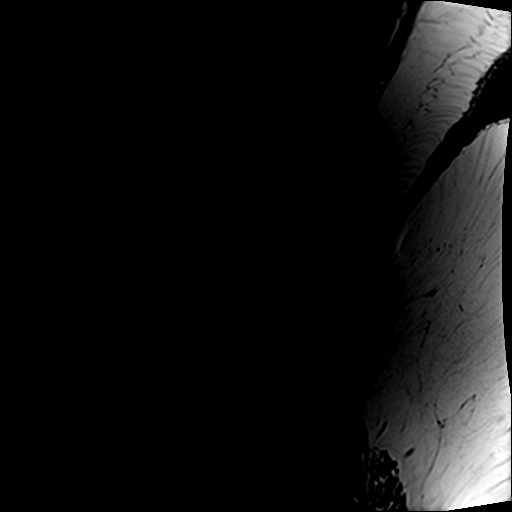
[im 3/13]
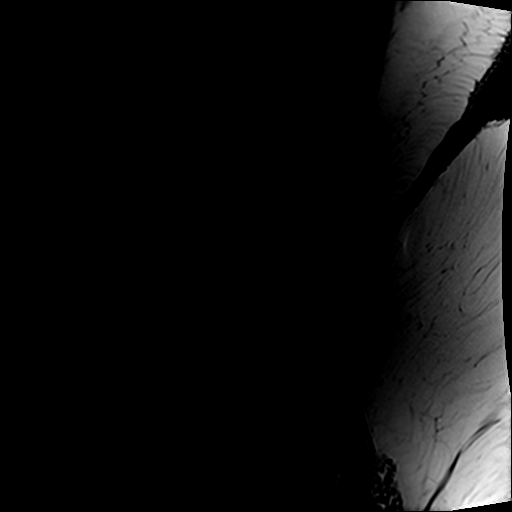
[im 5/13]
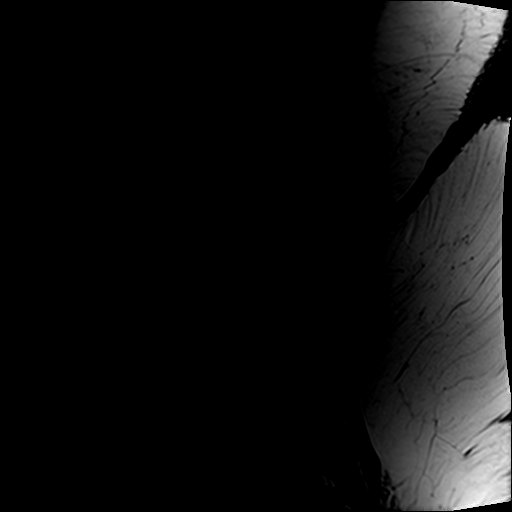
[im 8/13]
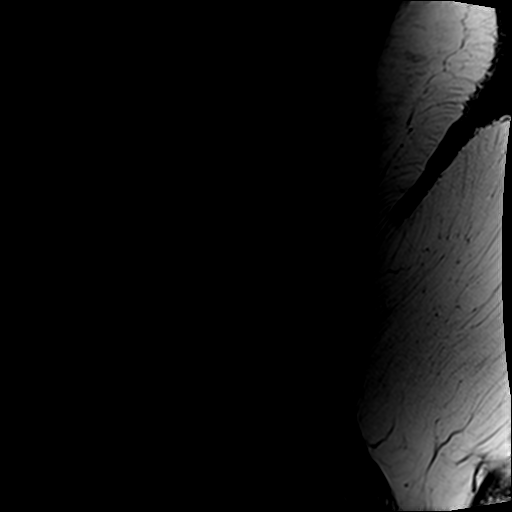
[im 10/13]
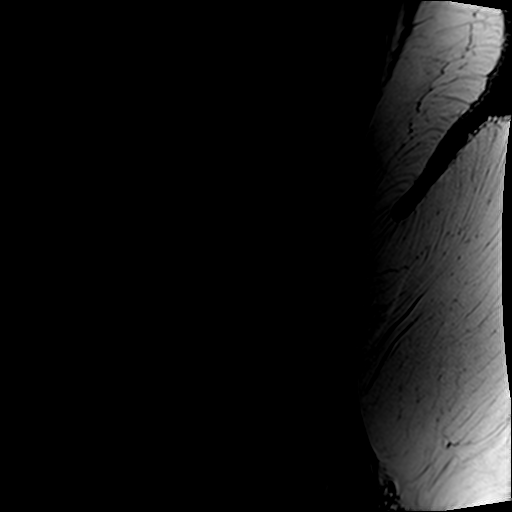
[im 13/13]
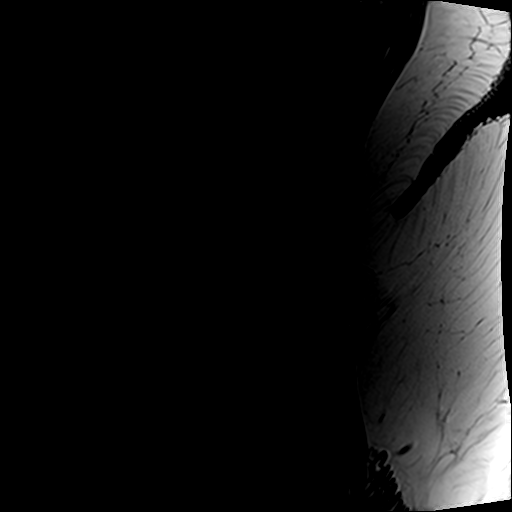

[Series 7: T2 · axial · 4.0mm · 0.70mm/px · z∈[-136,+79]mm · 9 of 35 slices shown]
[im 1/35]
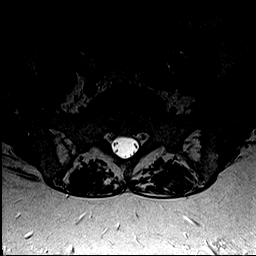
[im 5/35]
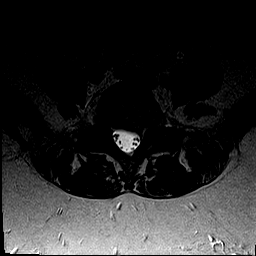
[im 10/35]
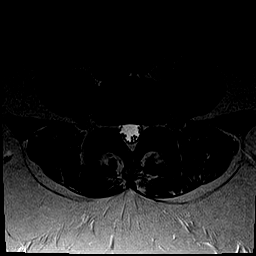
[im 15/35]
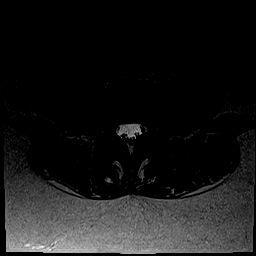
[im 18/35]
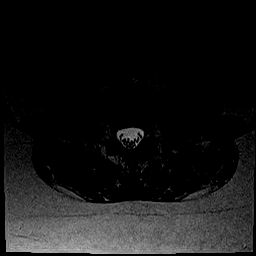
[im 20/35]
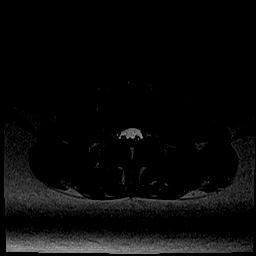
[im 25/35]
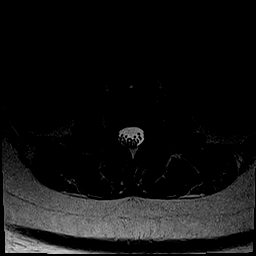
[im 30/35]
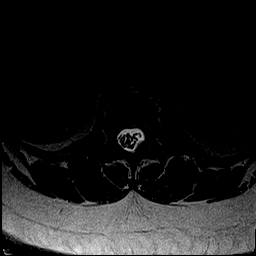
[im 35/35]
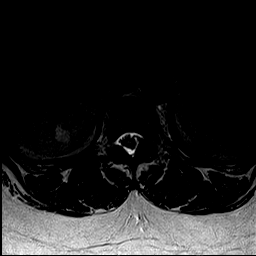

[Series 8: T1 · axial · 4.0mm · 0.35mm/px · z∈[-116,+54]mm · 3 of 35 slices shown (2 of 2)]
[im 5/35]
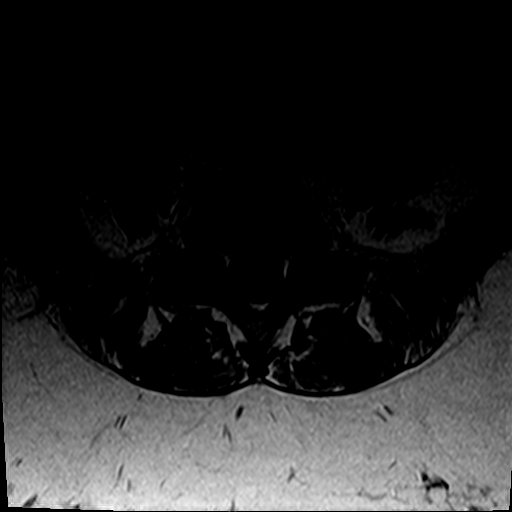
[im 18/35]
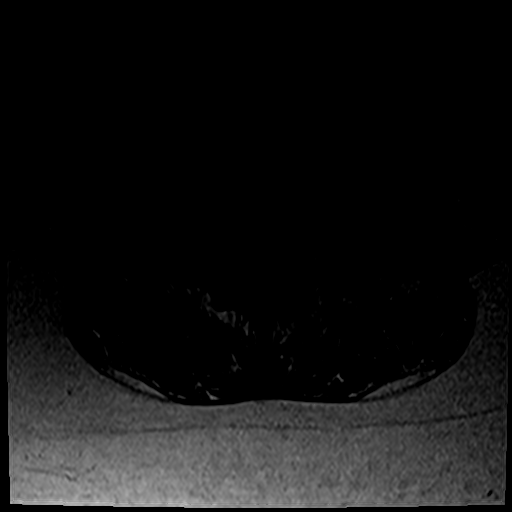
[im 30/35]
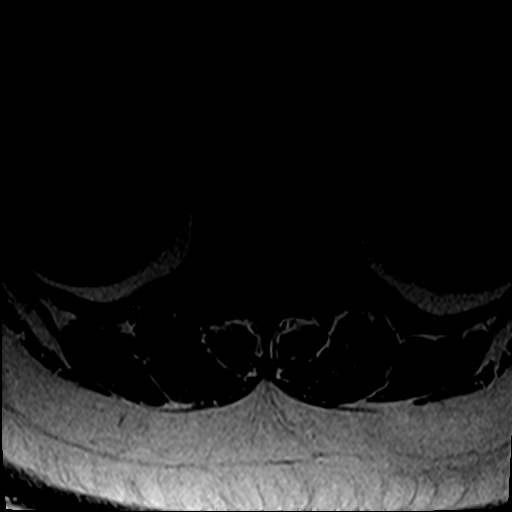

[24 of 48 positions shown; findings below may reference images not displayed]

FINDINGS: Segmentation:  5 lumbar type vertebral bodies.

Alignment:  Normal

Vertebrae:  No fracture or primary bone lesion.

Conus medullaris and cauda equina: Conus extends to the L1 level.

Paraspinal and other soft tissues: Negative

Disc levels:

In the lower thoracic region, there appear to be disc bulges at
T11-12 and T12-L1. There is prominent hypertrophic change of the
facets and ligaments, encroaching upon the spinal canal. This region
was not studied in detail, but the findings may be significant.
Consider a thoracic examination to look for thoracic stenosis.

T12-L1: No disc abnormality. Facet and ligamentous hypertrophy left
worse than right. Canal narrowing with some crowding of the nerve
roots but no definite neural compression. Foraminal stenosis on the
left that could affect the exiting T12 nerve.

L1-2: Mild disc bulge. Mild facet and ligamentous hypertrophy. No
compressive stenosis.

L2-3: Normal disc. Mild facet and ligamentous hypertrophy. No
compressive stenosis.

L3-4: Normal disc. Mild facet and ligamentous hypertrophy. No
stenosis.

L4-5: Mild bulging of the disc. Moderate facet and ligamentous
hypertrophy. No canal stenosis. Mild bilateral foraminal narrowing
without definite compression of the exiting L4 nerves.

L5-S1: Left posterolateral disc herniation. Facet and ligamentous
hypertrophy. Stenosis of the lateral recess on the left likely to
compress the left S1 nerve. Bilateral foraminal narrowing left more
than right. Some potential that the exiting L5 nerves could be
affected, particularly the left.
IMPRESSION: Left posterolateral disc herniation at L5-S1 quite likely to
compress the left S1 nerve. Bilateral foraminal narrowing left more
than right that could possibly affect the L5 nerves, particularly
the left.

L4-5 foraminal stenosis due to bulging disc and facet and
ligamentous hypertrophy. Some potential this could affect the
exiting L4 nerves.

Prominent facet and ligamentous hypertrophy in the lower thoracic
region with lesser changes in the upper lumbar region. Lower
thoracic region was not studied in detail, but there is potential
for significant canal and foraminal stenosis. At the T12-L1 level,
the nerve roots are crowded in there is foraminal narrowing on the
left that could possibly affect the T12 nerve. Consider a complete
thoracic exam if the clinical picture could be affected by thoracic
disease.

## 2019-09-09 ENCOUNTER — Other Ambulatory Visit: Payer: Self-pay | Admitting: Family Medicine

## 2019-09-09 MED ORDER — GABAPENTIN 400 MG PO CAPS: 400.0000 mg | ORAL_CAPSULE | Freq: Three times a day (TID) | ORAL | 1 refills | Status: DC

## 2019-09-09 MED ORDER — BACLOFEN 10 MG PO TABS: 10.0000 mg | ORAL_TABLET | Freq: Three times a day (TID) | ORAL | 0 refills | Status: DC | PRN

## 2019-09-09 MED ORDER — LACTULOSE 10 GM/15ML PO SOLN: 20.0000 g | Freq: Two times a day (BID) | ORAL | 1 refills | Status: DC

## 2019-09-09 MED ORDER — HYDROCHLOROTHIAZIDE 25 MG PO TABS: 25.0000 mg | ORAL_TABLET | Freq: Every day | ORAL | 1 refills | Status: DC

## 2019-09-09 MED ORDER — TAMSULOSIN HCL 0.4 MG PO CAPS: 0.4000 mg | ORAL_CAPSULE | Freq: Every day | ORAL | 1 refills | Status: DC

## 2019-09-09 NOTE — Telephone Encounter (Signed)
Requested medication (s) are due for refill today: no  Requested medication (s) are on the active medication list: yes  Last refill:  03/05/2019  Future visit scheduled: no  Notes to clinic:  Review for refill    Requested Prescriptions  Pending Prescriptions Disp Refills   baclofen (LIORESAL) 10 MG tablet 30 each 0    Sig: Take 1 tablet (10 mg total) by mouth every 8 (eight) hours as needed for muscle spasms.     Not Delegated - Analgesics:  Muscle Relaxants Failed - 09/09/2019 11:04 AM      Failed - This refill cannot be delegated      Passed - Valid encounter within last 6 months    Recent Outpatient Visits          2 weeks ago S/P laminectomy   [place] [facility], Langley Adie, MD   8 months ago Lumbar disc herniation with radiculopathy   [facility] at [facility], Langley Adie, MD   8 months ago Acute bilateral low back pain with bilateral sciatica   [facility]rap[facility] at [place], MD   9 months ago Muscle spasm of back   [facility], Langley Adie, MD              hydrochlorothiazide (HYDRODIURIL) 25 MG tablet       Sig: Take 1 tablet (25 mg total) by mouth daily.     Cardiovascular: Diuretics - Thiazide Failed - 09/09/2019 11:04 AM      Failed - Ca in normal range and within 360 days    Calcium  Date Value Ref Range Status  03/01/2019 8.7 (L) 8.9 - 10.3 mg/dL Final         Failed - Last BP in normal range    BP Readings from Last 1 Encounters:  08/04/19 (!) 139/96         Passed - Cr in normal range and within 360 days    Creatinine, Ser  Date Value Ref Range Status  03/01/2019 0.64 0.44 - 1.00 mg/dL Final         Passed - K in normal range and within 360 days    Potassium  Date Value Ref Range Status  03/01/2019 3.8 3.5 - 5.1 mmol/L Final         Passed - Na in normal range and within 360 days    Sodium  Date Value Ref Range Status  03/01/2019 135 135 - 145 mmol/L Final          Passed - Valid encounter within last 6 months    Recent Outpatient Visits          2 weeks ago S/P laminectomy   [place] at [facility], Langley Adie, MD   8 months ago Lumbar disc herniation with radiculopathy   [facility] at [facility], Langley Adie, MD   8 months ago Acute bilateral low back pain with bilateral sciatica   Therapist, music at [place], MD   9 months ago Muscle spasm of back   [facility] at [facility], Langley Adie, MD              lactulose (CHRONULAC) 10 GM/15ML solution 236 mL     Sig: Take 30 mLs (20 g total) by mouth 2 (two) times daily.     Gastroenterology:  Laxatives Passed - 09/09/2019 11:04 AM      Passed - Valid encounter within last 12  months    Recent Outpatient Visits          2 weeks ago S/P laminectomy   Therapist, [facility] at [place], MD   8 months ago Lumbar disc herniation with radiculopathy   [facility], Langley Adie, MD   8 months ago Acute bilateral low back pain with bilateral sciatica   Therapist, music at [place], MD   9 months ago Muscle spasm of back   [facility], Langley Adie, MD              tamsulosin (FLOMAX) 0.4 MG CAPS capsule 30 capsule     Sig: Take 1 capsule (0.4 mg total) by mouth daily after supper.     Urology: Alpha-Adrenergic Blocker Failed - 09/09/2019 11:04 AM      Failed - Last BP in normal range    BP Readings from Last 1 Encounters:  08/04/19 (!) 139/96         Passed - Valid encounter within last 12 months    Recent Outpatient Visits          2 weeks ago S/P laminectomy   [facility] at [facility], Langley Adie, MD   8 months ago Lumbar disc herniation with radiculopathy   Therapist[facility] at [facility], Langley Adie, MD   8 months ago Acute bilateral low back pain with bilateral sciatica   Therapist, music at [place],  MD   9 months ago Muscle spasm of back   [facility], Langley Adie, MD              gabapentin (NEURONTIN) 400 MG capsule       Sig: Take 1 capsule (400 mg total) by mouth 3 (three) times daily.     Neurology: Anticonvulsants - gabapentin Passed - 09/09/2019 11:04 AM      Passed - Valid encounter within last 12 months    Recent Outpatient Visits          2 weeks ago S/P laminectomy   [place] at [facility], Langley Adie, MD   8 months ago Lumbar disc herniation with radiculopathy   Therap[facility] at [facility], Langley Adie, MD   8 months ago Acute bilateral low back pain with bilateral sciatica   [place] [facility] [place], MD   9 months ago Muscle spasm of back   [facility], Langley Adie, MD

## 2019-09-09 NOTE — Telephone Encounter (Signed)
Medication Refill - Medication:  baclofen (LIORESAL) 10 MG tablet tamsulosin (FLOMAX) 0.4 MG CAPS capsule hydrochlorothiazide (HYDRODIURIL) 25 MG tablet       tamsulosin (FLOMAX) 0.4 MG CAPS capsule gabapentin (NEURONTIN) 400 MG capsule lactulose (CHRONULAC) 10 GM/15ML solution  Has the patient contacted their pharmacy? No - states she was instructed by PCP to call into office directly (Agent: If no, request that the patient contact the pharmacy for the refill.) (Agent: If yes, when and what did the pharmacy advise?)  Preferred Pharmacy (with phone number or street name):  [id number] [place] [place] [id number] (Phone) [phone] (Fax)   Agent: Please be advised that RX refills may take up to 3 business days. We ask that you follow-up with your pharmacy.

## 2019-09-10 ENCOUNTER — Telehealth: Payer: Self-pay | Admitting: Family Medicine

## 2019-09-10 NOTE — Telephone Encounter (Signed)
Home Health Verbal Orders - Caller/Agency: Radovan with [facility] Number: [phone], OK to leave a message Requesting OT/PT/Skilled Nursing/Social Work/Speech Therapy: OT Frequency: 1 week 1, 2x a week for 4 weeks

## 2019-09-13 NOTE — Telephone Encounter (Signed)
LVM for Radovan with [facility] for approved verbal orders

## 2019-09-16 ENCOUNTER — Telehealth: Payer: Self-pay

## 2019-09-16 NOTE — Telephone Encounter (Signed)
Copied from [place] [phone]. Topic: General - Inquiry >> Sep 16, 2019 11:00 AM Mathis Bud wrote: Reason for CRM: Marcie Bal from [facility] home health called stating patient is requesting to get incontinence supplies.  Marcie Bal stated that their office will need her diagnoses on prescription as well.  Marcie Bal would like prescription faxed to her. FAX [phone] Marcie Bal call back [phone]

## 2019-09-17 ENCOUNTER — Telehealth: Payer: Self-pay | Admitting: Family Medicine

## 2019-09-17 NOTE — Telephone Encounter (Signed)
Home Health Verbal Orders - Caller/Agency: [facility]  Callback Number: [phone] Requesting OT/PT/Skilled Nursing/Social Work/Speech Therapy: Need verbal  Extent nurse and aid for 4 more weeks

## 2019-09-17 NOTE — Telephone Encounter (Signed)
Roselyn Reef calling from [facility] called and stated that she would like to get the pt an oversized shower chair/ hand held shower head / larger size in diapers /safty bar and grabber. Please send to DME company

## 2019-09-19 ENCOUNTER — Other Ambulatory Visit: Payer: Self-pay | Admitting: Family Medicine

## 2019-09-20 ENCOUNTER — Other Ambulatory Visit: Payer: Self-pay

## 2019-09-21 ENCOUNTER — Other Ambulatory Visit: Payer: Self-pay

## 2019-09-21 DIAGNOSIS — N319 Neuromuscular dysfunction of bladder, unspecified: Secondary | ICD-10-CM

## 2019-09-21 DIAGNOSIS — M5116 Intervertebral disc disorders with radiculopathy, lumbar region: Secondary | ICD-10-CM

## 2019-09-21 DIAGNOSIS — K592 Neurogenic bowel, not elsewhere classified: Secondary | ICD-10-CM

## 2019-09-21 NOTE — Telephone Encounter (Signed)
Called LVM for Ellen Hill with [facility] regarding verbal orders requested

## 2019-09-21 NOTE — Telephone Encounter (Signed)
Spoke with Marcie Bal with [facility], advised that pt supplies have been ordered as requested. Advised that insurance will not pay for the hand held shower, safety bar and the grabber. Notified pt about this and she state that she is not needing all equipment but a bariatric walker and depends size 4X or 5X. [facility] was notified not to process the equipments not covered by pt  insurance. A new order for a Bariatric walker has been placed.

## 2019-09-21 NOTE — Telephone Encounter (Signed)
DME order has been placed with [facility].

## 2019-09-22 ENCOUNTER — Encounter: Payer: Medicaid Other | Admitting: Physical Medicine & Rehabilitation

## 2019-09-22 ENCOUNTER — Telehealth: Payer: Self-pay | Admitting: Family Medicine

## 2019-09-22 NOTE — Telephone Encounter (Signed)
Spoke with [employer] with [facility] gave verbal orders to extend pt home health PT

## 2019-09-22 NOTE — Telephone Encounter (Signed)
Charisse with [facility] calling for verbal to extend home health PT  2 wk 3  Starting next week  cb [phone]  With the initial request, they only saw pt 2 times, due to Medicaid restrictions

## 2019-09-24 ENCOUNTER — Other Ambulatory Visit: Payer: Self-pay | Admitting: Family Medicine

## 2019-09-24 NOTE — Telephone Encounter (Signed)
Spoke with pt pharmacy state that they did not receive refill on the 09/09/2019. New Rx was called in for refill

## 2019-10-01 ENCOUNTER — Telehealth: Payer: Self-pay | Admitting: *Deleted

## 2019-10-01 NOTE — Telephone Encounter (Signed)
Copied from [place] [phone]. Topic: General - Call Back - No Documentation >> Sep 29, 2019 12:48 PM Erick Blinks wrote: Reason for CRM: Pt called back to inquire status of incontinence supplies. Please advise  Best contact: [phone] >> Oct 01, 2019  9:25 AM Keene Breath wrote: Patient is calling again regarding her order.  Please advise and call patient to give her an update.  CB# [phone]

## 2019-10-01 NOTE — Telephone Encounter (Signed)
Forms were completed.  Unsure of current status.

## 2019-10-04 NOTE — Telephone Encounter (Signed)
Spoke with pt aware that am waiting on Jewell with [facility] to call our office regarding pt orders placed for incontinence supplies.Advised pt that I will call her when [facility] calls the office

## 2019-10-04 NOTE — Telephone Encounter (Signed)
Please let patient know.

## 2019-10-04 NOTE — Telephone Encounter (Signed)
Pt returning call to office

## 2019-10-05 ENCOUNTER — Other Ambulatory Visit: Payer: Self-pay | Admitting: Family Medicine

## 2019-10-06 NOTE — Telephone Encounter (Signed)
Spoke with pt aware that the office is waiting on Hinds form [facility] to call with the date of incontinence supplies shipment.

## 2019-10-06 NOTE — Telephone Encounter (Signed)
Pt LOV was 12/30/2018, requests refills on Baclofen 10 mg/20 mg. Please advise.

## 2019-10-06 NOTE — Telephone Encounter (Signed)
A copy of signed pt CMN was faxed to [facility] per Melissa to complete pt order.

## 2019-10-06 NOTE — Telephone Encounter (Signed)
Pt request refills on Trazodone 50 mg last filled on 08/20/2019, Please advise

## 2019-10-08 ENCOUNTER — Other Ambulatory Visit: Payer: Self-pay | Admitting: Family Medicine

## 2019-10-08 ENCOUNTER — Telehealth: Payer: Self-pay | Admitting: Family Medicine

## 2019-10-08 NOTE — Telephone Encounter (Signed)
FYI. Noted

## 2019-10-08 NOTE — Telephone Encounter (Signed)
Ellen Hill [facility]) states that pt will be d/c from OT today as pt has reached max potential.  Please call w/ questions/concerns: [phone]

## 2019-10-12 NOTE — Telephone Encounter (Signed)
LVM on [facility] regarding pt incontinence supplies

## 2019-10-12 NOTE — Telephone Encounter (Signed)
Melissa from [facility] called to report that initial shipment of supplies was sent to the wrong address. They spoke with Pt's sister today and the supplies has been shipped to the correct address and should arrive any time this week. Best contact: [phone]

## 2019-10-12 NOTE — Telephone Encounter (Signed)
Pt called to follow up on shipment for incontinence supplies. Requesting CB from [place].

## 2019-10-13 ENCOUNTER — Telehealth: Payer: Self-pay

## 2019-10-13 NOTE — Telephone Encounter (Signed)
These were the wrong size was large, need 4x company states they do not carry bariatrics.Please advise as sister is getting very frustrated, they do not know what to do. CB at [phone] states been working on this for a week.

## 2019-10-13 NOTE — Telephone Encounter (Signed)
Copied from [place] [phone]. Topic: General - Other >> Oct 13, 2019 10:15 AM Yvette Rack wrote: Reason for CRM: Pt stated she received the incontinence supplies but they are the wrong size. Pt would like to know what she needs to do to send the supplies back to get the correct size. Pt requests call back. Cb# [phone]

## 2019-10-13 NOTE — Telephone Encounter (Signed)
Spoke with pt aware that [facility] does not carry 4x  bariatric  incontinence supplies, I called [facility] who carry the bariatric size that pt needs but pt will need to pay for the supplies out of pocket and then get reimbursement by her insurance, pt states that she has no income currently to afford paying for the supplies. I called pt Home Health spoke with Marcie Bal who state that she will try get the supplies for pt and will contact pt to let her know.

## 2019-10-15 ENCOUNTER — Telehealth: Payer: Self-pay | Admitting: Family Medicine

## 2019-10-15 NOTE — Telephone Encounter (Signed)
Requesting PT Freq- 2w6   [phone] ok

## 2019-10-15 NOTE — Telephone Encounter (Signed)
[facility]isse [facility] with [facility]

## 2019-10-19 ENCOUNTER — Other Ambulatory Visit: Payer: Self-pay | Admitting: Family Medicine

## 2019-10-20 ENCOUNTER — Telehealth: Payer: Self-pay | Admitting: Family Medicine

## 2019-10-20 NOTE — Telephone Encounter (Signed)
Ellen Hill called back about this , she would like to see if she could get a call back with the this verbal

## 2019-10-21 NOTE — Telephone Encounter (Signed)
Spoke with [employer] gave verbal orders for pt to start PT

## 2019-10-22 ENCOUNTER — Telehealth: Payer: Self-pay

## 2019-10-22 NOTE — Telephone Encounter (Signed)
Spoke with pt advised that [facility] does not carry Bariatrics incontinence supply, Pt is aware that I spoke with her [place] who state hat they would try get the supplies for pt.      Copied from [place] [phone]. Topic: General - Inquiry >> Oct 20, 2019  3:51 PM Alease Frame wrote: Reason for CRM:  Pt stated she received the incontinence supplies but they are the wrong size. Pt would like to know what she needs to do to send the supplies back to get the correct size. Pt requests call back. Cb# [phone]

## 2019-10-24 ENCOUNTER — Other Ambulatory Visit: Payer: Self-pay | Admitting: Family Medicine

## 2019-10-27 NOTE — Telephone Encounter (Signed)
Patient's sister is requesting call back from CMA to discuss supplies.

## 2019-10-27 NOTE — Telephone Encounter (Signed)
Pt wants to know if there is any update on the status of her supplies.

## 2019-10-27 NOTE — Telephone Encounter (Signed)
Spoke with pt called to check if the forms for her incontinence supplies have been completed, advised pt to that the office will call her soon as the form gets faxed.

## 2019-11-02 NOTE — Telephone Encounter (Signed)
Our offic has received addational forms on pt, Forms have been placed on Dr Volanda Napoleon desk waiting completing

## 2019-11-02 NOTE — Telephone Encounter (Signed)
Spoke with pt state that she received her incontinence supplies but was the wrong size, pt states that the company states that they don't carry pt size. Advise for pt to call [facility] and check what their requirements are to get the supplies with them. Pt has had two Medical supply her with the wrong size of briefs. Pt verbalized understanding

## 2019-11-02 NOTE — Telephone Encounter (Signed)
Pt called stating she is still requesting to have these supplies. Pt states [facility] is refusing to give her the supplies because they believe she is receiving supplies from [facility]. Please advise.

## 2019-11-05 ENCOUNTER — Telehealth: Payer: Self-pay | Admitting: Family Medicine

## 2019-11-05 NOTE — Telephone Encounter (Signed)
Copied from [place] [phone]. Topic: General - Other >> Nov 05, 2019  9:28 AM Keene Breath wrote: Reason for CRM: Called to request orders for incontinent supplies for patient.  Also said they would fax the request as well so patient could get the order quickly.  Please call to discuss at [phone]

## 2019-11-05 NOTE — Telephone Encounter (Signed)
Forms were completed.  If not already faxed they are in this provider's folders.

## 2019-11-05 NOTE — Telephone Encounter (Signed)
Spoke with [employer] Per [place] office needs an order stating " dx of incontinence of bowel and bladder, incontinence supplies diapers and chucks." This is due to patient having Medicaid and this company can bill for it.   Fax# [phone].

## 2019-11-09 ENCOUNTER — Other Ambulatory Visit: Payer: Self-pay | Admitting: Family Medicine

## 2019-11-10 ENCOUNTER — Encounter: Payer: Medicaid Other | Admitting: Physical Medicine & Rehabilitation

## 2019-11-10 ENCOUNTER — Telehealth: Payer: Self-pay | Admitting: Family Medicine

## 2019-11-10 ENCOUNTER — Telehealth: Payer: Self-pay | Admitting: *Deleted

## 2019-11-10 NOTE — Telephone Encounter (Signed)
Tiffany called and is requesting same information about wheelchair. Please advise.    [place]

## 2019-11-10 NOTE — Telephone Encounter (Signed)
Marcie Bal with [facility] calling to check on the orders for the incontinence supplies.  States that they have never received the fax.  Please refax to:  [phone].

## 2019-11-10 NOTE — Telephone Encounter (Signed)
Okay to refill? 

## 2019-11-10 NOTE — Telephone Encounter (Signed)
ok 

## 2019-11-10 NOTE — Telephone Encounter (Signed)
Forms faxed over

## 2019-11-10 NOTE — Telephone Encounter (Signed)
Attempted to call to give verbal orders. No answer and unable to leave VM

## 2019-11-10 NOTE — Telephone Encounter (Signed)
Copied from [place] [phone]. Topic: General - Other >> Nov 10, 2019  9:52 AM Leward Quan A wrote: Reason for CRM: Lattie Haw called to say that a CMN form for medicare was faxed over to Dr Volanda Napoleon to sign for patients lifts. She will fax this form over again and asking for it completed and back to them ASAP at fax# [phone]

## 2019-11-10 NOTE — Telephone Encounter (Signed)
Spoke with patient. Patient notified that all forms have been faxed. Patient verbalized understanding.

## 2019-11-10 NOTE — Telephone Encounter (Signed)
Copied from [place] [phone]. Topic: General - Other >> Nov 10, 2019  3:54 PM Rainey Pines A wrote: [name] home health nurse called for Verbal order for trapez bar and to continue home health order dec 6th 2w1 ,1w1 . Best contact [phone]

## 2019-11-10 NOTE — Telephone Encounter (Signed)
Patient is checking status on if paper work has been refaxed. Patient would like a call when paper work is faxed over Call back [phone]

## 2019-11-10 NOTE — Telephone Encounter (Signed)
Pt is requesting a refill for her Rx for Nystatin (Nystop) 100,000 usp units per gram. Dosage apply to effected area 2 to 3 times daily. 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

## 2019-11-11 NOTE — Telephone Encounter (Signed)
Unable to leave a message for the home health nurse due to voicemail being full.

## 2019-11-12 ENCOUNTER — Telehealth: Payer: Self-pay

## 2019-11-12 NOTE — Telephone Encounter (Signed)
Please advise 

## 2019-11-12 NOTE — Telephone Encounter (Signed)
Copied from [place] [phone]. Topic: General - Inquiry >> Nov 12, 2019  9:03 AM Mathis Bud wrote: Reason for CRM: Alyse Low called from [facility] home health called stating patient had a wound on her back.  It did scab over, when the scab fell off nurse could see a suture.  Alyse Low states she cannot get the sutures out herself, and would like further instructions.   Today was patient last home visit and would like to know if PCP can approve more for patient.   Call back [phone]

## 2019-11-12 NOTE — Telephone Encounter (Signed)
Pt forms have been completed and faxed

## 2019-11-15 NOTE — Telephone Encounter (Signed)
Multiple Forms for pt have been completed and faxed back, nothing further needed

## 2019-11-15 NOTE — Telephone Encounter (Signed)
Ok to refill 

## 2019-11-15 NOTE — Telephone Encounter (Signed)
Pt is to be having f/u with PM&R.  Pt should have also have f/u with her surgeon.  Pt should be seen by her surgeon for wound check and removal of possible suture.

## 2019-11-16 ENCOUNTER — Other Ambulatory Visit: Payer: Self-pay

## 2019-11-16 MED ORDER — NYSTATIN 100000 UNIT/GM EX POWD: Freq: Two times a day (BID) | CUTANEOUS | 1 refills | Status: DC

## 2019-11-16 NOTE — Telephone Encounter (Signed)
Called and left a detailed message for Fries with [facility], advised to call our office back regarding pt recommendations

## 2019-11-16 NOTE — Telephone Encounter (Signed)
Rx sent to pharmacy, pt is aware

## 2019-11-16 NOTE — Telephone Encounter (Signed)
Left a detailed message on Charisse voice mail regarding approved verbal orders for requested Trapez bar. Advised to call office with any further questions

## 2019-11-22 ENCOUNTER — Telehealth: Payer: Self-pay | Admitting: Family Medicine

## 2019-11-22 NOTE — Telephone Encounter (Signed)
Ellen Hill, [place] is calling Izora Gala back on Orders for the wound on the patient's Back. CB- [phone]

## 2019-11-22 NOTE — Telephone Encounter (Signed)
Called left a detailed message [place] regarding Dr Volanda Napoleon advice on removal of suture

## 2019-11-23 NOTE — Telephone Encounter (Signed)
Ellen Hill is returning a call.  Tried the office, but was told she would not be in until after 1pm.  Please call back at [phone]

## 2019-11-23 NOTE — Telephone Encounter (Signed)
Spoke with Ellen Hill with [facility] regarding pt suture removal, advised that per Dr Volanda Napoleon pt needs to schedule appointment with the surgeon for suture removal. Verbalized understanding

## 2019-11-25 NOTE — Telephone Encounter (Signed)
See note

## 2019-11-25 NOTE — Telephone Encounter (Signed)
Spoke with [facility] nurse verbalized that order requested for a Trapez bar was approved by Dr Volanda Napoleon, nurse also stated that they wanted to extend pt home health care for 2 more weeks, states that she will send forms to the office for signing.

## 2019-11-25 NOTE — Telephone Encounter (Signed)
Charisse called back requesting that office fax a written order for this  [phone]

## 2019-11-26 ENCOUNTER — Other Ambulatory Visit: Payer: Self-pay

## 2019-11-26 DIAGNOSIS — M5116 Intervertebral disc disorders with radiculopathy, lumbar region: Secondary | ICD-10-CM

## 2019-11-26 NOTE — Telephone Encounter (Signed)
Order for Trapez bar faxed to [facility] with [facility] as requested, confirmation received

## 2019-12-06 ENCOUNTER — Other Ambulatory Visit: Payer: Self-pay | Admitting: Family Medicine

## 2019-12-09 ENCOUNTER — Other Ambulatory Visit: Payer: Self-pay | Admitting: Family Medicine

## 2019-12-10 ENCOUNTER — Telehealth: Payer: Self-pay | Admitting: Family Medicine

## 2019-12-10 NOTE — Telephone Encounter (Signed)
Message Routed to PCP CMA 

## 2019-12-10 NOTE — Telephone Encounter (Signed)
Copied from [place] [phone]. Topic: General - Other >> Dec 10, 2019 12:53 PM Keene Breath wrote: Reason for CRM: Verbal orders 1wk1, for next week for PT.  CB# [phone]

## 2019-12-13 NOTE — Telephone Encounter (Signed)
Ellen Hill from [facility] calling again from Fri/18th Pt really needs P Therapy 1 wk 1 to start out till Medicare approves please Fu second request

## 2019-12-13 NOTE — Telephone Encounter (Signed)
Left a message for Charisse from [facility] with approval for requested orders for PT therapy.

## 2019-12-23 ENCOUNTER — Telehealth: Payer: Self-pay | Admitting: *Deleted

## 2019-12-23 NOTE — Telephone Encounter (Signed)
Copied from [place] [phone]. Topic: General - Other >> Dec 23, 2019 11:21 AM Greggory Keen D wrote: Reason for CRM: pt called saying she needs an order placed for dressing supplies 4x4 and 2x2 pads. WND 6x6.  [facility] was coming out but they have discharged her.  They were getting the supplies through [place].  Pt CB#  [phone]

## 2019-12-27 NOTE — Telephone Encounter (Signed)
Spoke with pt aware that wound supplies do not need a prescription since they are over considered OTC. Pt verbalized understanding

## 2019-12-30 ENCOUNTER — Telehealth: Payer: Self-pay | Admitting: *Deleted

## 2019-12-30 NOTE — Telephone Encounter (Signed)
Copied from [place] [phone]. Topic: General - Other >> Dec 30, 2019  2:34 PM Rainey Pines A wrote: Patient is needing approval on physical therapy. Patient stated that insurance is needing a renewal on order for patient to continue to do physical therapy for this year.  Patient requesting callback from [place].

## 2019-12-31 NOTE — Telephone Encounter (Signed)
Spoke with pt states that she will call our office back with information regarding what agency she wants to use for her PT therapy.

## 2020-01-05 ENCOUNTER — Encounter: Payer: Medicaid Other | Admitting: Physical Medicine & Rehabilitation

## 2020-01-05 ENCOUNTER — Other Ambulatory Visit: Payer: Self-pay | Admitting: Family Medicine

## 2020-01-07 NOTE — Telephone Encounter (Signed)
Spoke with pt states that she will call the office with the name of the Agency that she wants to use for her PT

## 2020-01-11 ENCOUNTER — Telehealth: Payer: Self-pay | Admitting: *Deleted

## 2020-01-11 NOTE — Telephone Encounter (Signed)
Copied from [place] [phone]. Topic: General - Call Back - No Documentation >> Jan 11, 2020  1:48 PM Erick Blinks wrote: [phone] Pt is calling requesitng a call back regarding Physical Therapy and to place another order for disposable padding for beds. Please advise

## 2020-01-14 ENCOUNTER — Telehealth: Payer: Self-pay | Admitting: Family Medicine

## 2020-01-14 ENCOUNTER — Other Ambulatory Visit: Payer: Self-pay | Admitting: Family Medicine

## 2020-01-14 ENCOUNTER — Other Ambulatory Visit: Payer: Self-pay

## 2020-01-14 DIAGNOSIS — N319 Neuromuscular dysfunction of bladder, unspecified: Secondary | ICD-10-CM

## 2020-01-14 NOTE — Telephone Encounter (Signed)
Pt called in regards to a CPE therapist referral that was sent in. Pt would like to be contacted at [phone] She also wants a reorder of chucks (disposal padding that goes underneath you in bed)

## 2020-01-14 NOTE — Telephone Encounter (Signed)
Spoke with pt state that [facility] should be faxing paperwork for PT approval, Pt order for chucks reorder  was sent to [place]

## 2020-01-17 NOTE — Telephone Encounter (Signed)
Pt  Home Health Forms for pt physical therapy was received from [facility]  completed and faxed bach to the Agency

## 2020-01-18 ENCOUNTER — Telehealth: Payer: Self-pay | Admitting: Family Medicine

## 2020-01-18 NOTE — Telephone Encounter (Signed)
Jessica from [place] called about the [facility] that has been faxed over for the supplies that were ordered for [facility]. She stated that she has not received a response and wanted to check on it, Janett Billow can be reached at [phone]

## 2020-01-18 NOTE — Telephone Encounter (Signed)
Pt CMM was signed and faxed to [facility] with [place].

## 2020-01-26 ENCOUNTER — Encounter: Payer: Medicaid Other | Attending: Physical Medicine & Rehabilitation | Admitting: Physical Medicine & Rehabilitation

## 2020-01-26 ENCOUNTER — Encounter: Payer: Self-pay | Admitting: Physical Medicine & Rehabilitation

## 2020-01-26 ENCOUNTER — Other Ambulatory Visit: Payer: Self-pay

## 2020-01-26 VITALS — Ht 66.0 in | Wt 350.0 lb

## 2020-01-26 DIAGNOSIS — K592 Neurogenic bowel, not elsewhere classified: Secondary | ICD-10-CM

## 2020-01-26 DIAGNOSIS — M5416 Radiculopathy, lumbar region: Secondary | ICD-10-CM

## 2020-01-26 DIAGNOSIS — M4714 Other spondylosis with myelopathy, thoracic region: Secondary | ICD-10-CM

## 2020-01-26 NOTE — Progress Notes (Signed)
Subjective:    Patient ID: Ellen Hill, female    DOB: May 15, 1971, 49 y.o.   MRN: [id number]  HPI   Due to national recommendations of social distancing because of COVID 36, an audio/video tele-health visit is felt to be the most appropriate encounter for this patient at this time. See MyChart message from today for the patient's consent to a tele-health encounter with [facility]. This is a follow up tele-visit via Webex. The patient is at home. MD is at office.    I am meeting with Ellen Hill today regarding her thoracic myelopathy.  I last saw her in May when we met over the phone. She has been home for a few months (since September).  She hasn't resumed [facility] therapies since we turned to 2021. Family and the church is helping with self-care and exercise at home. She has a retained suture in the back which NS is aware of.  Additionally she reports that she has an abrasion on the posterior aspect of her right thigh.  She thinks it is from her briefs  Pain levels are minimal.  She remains on gabapentin for neuropathic pain.  She is moving her bowels and bladder daily continently.   She is transferring with a hoyer but is working on transfers with her family and church members.   Her mood has been very positive.  She is sleeping fairly well.     Pain Inventory Average Pain 0 Pain Right Now 0 My pain is no pain  In the last 24 hours, has pain interfered with the following? General activity 0 Relation with others 0 Enjoyment of life 0 What TIME of day is your pain at its worst? no pain Sleep (in general) Fair  Pain is worse with: no pain Pain improves with: no pain Relief from Meds: no pain  Mobility walk with assistance use a walker ability to climb steps?  no do you drive?  no use a wheelchair needs help with transfers  Function not employed: date last employed . disabled: date disabled . I need assistance with the following:   dressing, bathing, toileting, meal prep, household duties and shopping  Neuro/Psych trouble walking spasms dizziness anxiety  Prior Studies Any changes since last visit?  no  Physicians involved in your care Any changes since last visit?  no   Family History  Problem Relation Age of Onset  . Hypertension Mother   . Cancer Mother        Breast  . Breast cancer Mother   . Hypertension Father   . Cancer Father        Prostate  . Heart attack Father   . Hypertension Sister   . Diabetes Maternal Aunt    Social History   Socioeconomic History  . Marital status: Single    Spouse name: Not on file  . Number of children: Not on file  . Years of education: Not on file  . Highest education level: Not on file  Occupational History  . Not on file  Tobacco Use  . Smoking status: Former Smoker    Packs/day: 0.30    Years: 1.00    Pack years: 0.30    Types: Cigarettes    Quit date: 03/18/1999    Years since quitting: 20.8  . Smokeless tobacco: Never Used  Substance and Sexual Activity  . Alcohol use: No  . Drug use: No  . Sexual activity: Not Currently    Birth control/protection:  Surgical  Other Topics Concern  . Not on file  Social History Narrative  . Not on file   Social Determinants of Health   Financial Resource Strain:   . Difficulty of Paying Living Expenses: Not on file  Food Insecurity:   . Worried About Charity fundraiser in the Last Year: Not on file  . Ran Out of Food in the Last Year: Not on file  Transportation Needs:   . Lack of Transportation (Medical): Not on file  . Lack of Transportation (Non-Medical): Not on file  Physical Activity:   . Days of Exercise per Week: Not on file  . Minutes of Exercise per Session: Not on file  Stress:   . Feeling of Stress : Not on file  Social Connections:   . Frequency of Communication with Friends and Family: Not on file  . Frequency of Social Gatherings with Friends and Family: Not on file  . Attends  Religious Services: Not on file  . Active Member of Clubs or Organizations: Not on file  . Attends Archivist Meetings: Not on file  . Marital Status: Not on file   Past Surgical History:  Procedure Laterality Date  . CERVICAL POLYPECTOMY  05/01/2012   Procedure: CERVICAL POLYPECTOMY;  Surgeon: Betsy Coder, MD;  Location: [facility];  Service: Gynecology;  Laterality: N/A;  . CESAREAN SECTION  07/04/2007  . CHOLECYSTECTOMY  07/06/2013  . CHOLECYSTECTOMY N/A 07/06/2013   Procedure: LAPAROSCOPIC CHOLECYSTECTOMY ;  Surgeon: Gayland Curry, MD;  Location: [facility];  Service: General;  Laterality: N/A;  attempted cholangiogram  . fibroidecto    . fibroidectomy  07/04/2007  . LUMBAR LAMINECTOMY/DECOMPRESSION MICRODISCECTOMY N/A 01/19/2019   Procedure: Thoracic Nine-Lumbar One Decompressive Laminectomy;  Surgeon: Earnie Larsson, MD;  Location: [facility];  Service: Neurosurgery;  Laterality: N/A;  posterior  . MYOMECTOMY  07/04/08  . OPERATIVE HYSTEROSCOPY  05/01/2012   no menses since that time  . TUBAL LIGATION  07/04/2007   Past Medical History:  Diagnosis Date  . Abnormal Pap smear    history  . Anemia   . ASCUS (atypical squamous cells of undetermined significance) on Pap smear 2008   At 36wk of pregnancy ; colpo  . Asthma    rarely uses inhaler (seasonal)  . 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 Fibroids   . H/O candidiasis   . H/O fatigue 08/2007  . H/O varicella   . Headache(784.0)    migraines   . Heart murmur    dx child - no problems as adult  . Hx: UTI (urinary tract infection) 07/29/07  . Increased BMI   . Irregular bleeding   . Migraines   . Obesity 02/13/12  . Pelvic pain 02/13/12  . Postpartum anemia 08/18/07  . 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 after loss of parents, is fine  . Vaginal Pap smear, abnormal    f/u wnl   There were no vitals taken for this visit.  Opioid Risk Score:   Fall Risk Score:  `1  Depression screen PHQ 2/9  Depression screen [facility] 2/9 04/28/2019  07/21/2017 05/13/2017 05/13/2017  Decreased Interest _0 Down, Depressed, Hopeless _1 PHQ - 2 Score _2 Altered sleeping - 2 3 -  Tired, decreased energy - 2 3 -  Change in appetite - 2 3 -  Feeling bad or failure about yourself  - 1 1 -  Trouble concentrating -  0 1 -  Moving slowly or fidgety/restless - 0 0 -  Suicidal thoughts - 0 0 -  PHQ-9 Score - 11 15 -    Review of Systems  Constitutional: Negative.   HENT: Negative.   Eyes: Negative.   Respiratory: Negative.   Cardiovascular: Negative.   Gastrointestinal: Negative.   Endocrine: Negative.   Genitourinary: Negative.   Musculoskeletal: Positive for gait problem.  Skin: Positive for wound.  Allergic/Immunologic: Negative.   Neurological: Positive for dizziness.  Hematological: Negative.   Psychiatric/Behavioral: The patient is nervous/anxious.   All other systems reviewed and are negative.      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  -home with [place] therapies---these shall continues. 2.   Pain Management: Continue gabapentin 400 mg 3 times daily.    Patient with minimal pain at present  3. Skin:  Patient will follow up with neurosurgery regarding issues with her incision/suture.  Continue pressure relief measures and close observations for area on right thigh 4. Mood:seems pretty positive.   Appears motivated to improve 5Super obesity.  Diet has been reviewed previously 10. Neurogenic bowel and bladder.   Patient reports that she is now continent of bowel and bladder    Additionally we discussed Covid vaccine and available resources for her to get her name on the list.  15  minutes of tele-visit time was spent with this patient today. Follow up in 3 months

## 2020-01-31 ENCOUNTER — Telehealth: Payer: Self-pay | Admitting: Family Medicine

## 2020-01-31 NOTE — Telephone Encounter (Signed)
Spoke with pt aware that I spoke with [facility] regarding the order for disposable chucks, advised that they have been trying to reach pt with no success, provided [employer] with pt current mobile number, provided pt with [facility] phone number to try call and see why the delay. Advised pt that our office will keep her updated with the order. Pt verbalized understanding

## 2020-01-31 NOTE — Telephone Encounter (Signed)
Pt call and want a call back about her [place] order

## 2020-02-02 ENCOUNTER — Telehealth (INDEPENDENT_AMBULATORY_CARE_PROVIDER_SITE_OTHER): Payer: Medicaid Other | Admitting: Family Medicine

## 2020-02-02 ENCOUNTER — Other Ambulatory Visit: Payer: Self-pay

## 2020-02-02 DIAGNOSIS — K0889 Other specified disorders of teeth and supporting structures: Secondary | ICD-10-CM | POA: Diagnosis not present

## 2020-02-02 MED ORDER — AMOXICILLIN 500 MG PO CAPS: 500.0000 mg | ORAL_CAPSULE | Freq: Three times a day (TID) | ORAL | 0 refills | Status: DC

## 2020-02-02 NOTE — Progress Notes (Signed)
This visit type was conducted due to national recommendations for restrictions regarding the COVID-19 pandemic in an effort to limit this patient's exposure and mitigate transmission in our community.   Virtual Visit via Video Note  I connected with Ellen Hill on 02/02/20 at  3:45 PM EST by a video enabled telemedicine application and verified that I am speaking with the correct person using two identifiers.  Location patient: home Location provider:work or home office Persons participating in the virtual visit: patient, provider  I discussed the limitations of evaluation and management by telemedicine and the availability of in person appointments. The patient expressed understanding and agreed to proceed.   HPI: Ellen Hill has had about 1 week of left lower mandible posterior molar pain.  She thinks she has had some low-grade "fever" around 99.5-100.  She apparently had similar infection in the past.  She has taken some ibuprofen for pain and that does help somewhat.  She does not have a regular dentist.  Minimal if any facial swelling.  No known antibiotic allergies.  No TMJ pain.   ROS: See pertinent positives and negatives per HPI.  Past Medical History:  Diagnosis Date  . Abnormal Pap smear    history  . Anemia   . ASCUS (atypical squamous cells of undetermined significance) on Pap smear 2008   At 36wk of pregnancy ; colpo  . Asthma    rarely uses inhaler (seasonal)  . 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 Fibroids   . H/O candidiasis   . H/O fatigue 08/2007  . H/O varicella   . Headache(784.0)    migraines   . Heart murmur    dx child - no problems as adult  . Hx: UTI (urinary tract infection) 07/29/07  . Increased BMI   . Irregular bleeding   . Migraines   . Obesity 02/13/12  . Pelvic pain 02/13/12  . Postpartum anemia 08/18/07  . 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 after loss of parents, is fine  . Vaginal Pap smear, abnormal    f/u wnl    Past Surgical History:   Procedure Laterality Date  . CERVICAL POLYPECTOMY  05/01/2012   Procedure: CERVICAL POLYPECTOMY;  Surgeon: Betsy Coder, MD;  Location: [facility];  Service: Gynecology;  Laterality: N/A;  . CESAREAN SECTION  07/04/2007  . CHOLECYSTECTOMY  07/06/2013  . CHOLECYSTECTOMY N/A 07/06/2013   Procedure: LAPAROSCOPIC CHOLECYSTECTOMY ;  Surgeon: Gayland Curry, MD;  Location: [facility];  Service: General;  Laterality: N/A;  attempted cholangiogram  . fibroidecto    . fibroidectomy  07/04/2007  . LUMBAR LAMINECTOMY/DECOMPRESSION MICRODISCECTOMY N/A 01/19/2019   Procedure: Thoracic Nine-Lumbar One Decompressive Laminectomy;  Surgeon: Earnie Larsson, MD;  Location: [facility];  Service: Neurosurgery;  Laterality: N/A;  posterior  . MYOMECTOMY  07/04/08  . OPERATIVE HYSTEROSCOPY  05/01/2012   no menses since that time  . TUBAL LIGATION  07/04/2007    Family History  Problem Relation Age of Onset  . Hypertension Mother   . Cancer Mother        Breast  . Breast cancer Mother   . Hypertension Father   . Cancer Father        Prostate  . Heart attack Father   . Hypertension Sister   . Diabetes Maternal Aunt     SOCIAL HX: former smoker.   Current Outpatient Medications:  .  amoxicillin (AMOXIL) 500 MG capsule, Take 1 capsule (500 mg total) by mouth 3 (three) times daily., Disp: 30  capsule, Rfl: 0 .  baclofen (LIORESAL) 10 MG tablet, TAKE 1 TABLET TWICE A DAY AT 8 AM AND 1 PM WITH 5 MG TABLET, Disp: 60 tablet, Rfl: 1 .  baclofen (LIORESAL) 20 MG tablet, TAKE 1 TABLET EVERY DAY AT 6 PM, Disp: 30 tablet, Rfl: 1 .  gabapentin (NEURONTIN) 400 MG capsule, TAKE 1 CAPSULE 3 TIMES A DAY AT 8 AM, 1 PM, AND 6 PM, Disp: 90 capsule, Rfl: 1 .  hydrochlorothiazide (HYDRODIURIL) 25 MG tablet, TAKE 1 TABLET BY MOUTH EVERY DAY, Disp: 30 tablet, Rfl: 1 .  HYDROcodone-acetaminophen (NORCO) 10-325 MG tablet, Take 1 tablet by mouth 3 (three) times daily as needed for moderate pain., Disp: , Rfl:  .  KLOR-CON M10 10 MEQ tablet, TAKE 1  TABLET TWICE A DAY AT 8 AM AND 8 PM, Disp: 180 tablet, Rfl: 1 .  lactulose (CHRONULAC) 10 GM/15ML solution, Take 30 mLs (20 g total) by mouth 2 (two) times daily., Disp: 236 mL, Rfl: 1 .  nystatin (MYCOSTATIN/NYSTOP) powder, APPLY TOPICALLY 2 TIMES DAILY AT 12 NOON AND 4 PM., Disp: 30 g, Rfl: 1 .  tamsulosin (FLOMAX) 0.4 MG CAPS capsule, TAKE 1 CAPSULE (0.4 MG TOTAL) BY MOUTH DAILY AFTER SUPPER., Disp: 30 capsule, Rfl: 1 .  vitamin C (ASCORBIC ACID) 500 MG tablet, Take 500 mg by mouth every evening., Disp: , Rfl:   EXAM:  VITALS per patient if applicable:  GENERAL: alert, oriented, appears well and in no acute distress  HEENT: atraumatic, conjunttiva clear, no obvious abnormalities on inspection of external nose and ears  NECK: normal movements of the head and neck  LUNGS: on inspection no signs of respiratory distress, breathing rate appears normal, no obvious gross SOB, gasping or wheezing  CV: no obvious cyanosis  MS: moves all visible extremities without noticeable abnormality  PSYCH/NEURO: pleasant and cooperative, no obvious depression or anxiety, speech and thought processing grossly intact  ASSESSMENT AND PLAN:  Discussed the following assessment and plan:  Tooth pain - Plan: Ambulatory referral to Dentistry   -Sounds like she probably has dental abscess left lower  posterior molar -Start amoxicillin 500 mg 3 times daily for 10 days -Set up referral to dentist -she wants to continue with the Ibuprofen for pain control.       I discussed the assessment and treatment plan with the patient. The patient was provided an opportunity to ask questions and all were answered. The patient agreed with the plan and demonstrated an understanding of the instructions.   The patient was advised to call back or seek an in-person evaluation if the symptoms worsen or if the condition fails to improve as anticipated.     Carolann Littler, MD

## 2020-02-03 ENCOUNTER — Telehealth: Payer: Self-pay

## 2020-02-03 NOTE — Telephone Encounter (Signed)
Pt incontinence supplies were shipped out and spoke with pt to confirm states that she received shipment

## 2020-02-06 ENCOUNTER — Other Ambulatory Visit: Payer: Self-pay | Admitting: Family Medicine

## 2020-02-11 ENCOUNTER — Other Ambulatory Visit: Payer: Self-pay | Admitting: Family Medicine

## 2020-02-15 ENCOUNTER — Encounter: Payer: Self-pay | Admitting: Family Medicine

## 2020-02-15 ENCOUNTER — Telehealth: Payer: Self-pay | Admitting: Family Medicine

## 2020-02-15 ENCOUNTER — Other Ambulatory Visit: Payer: Self-pay

## 2020-02-15 DIAGNOSIS — M5416 Radiculopathy, lumbar region: Secondary | ICD-10-CM

## 2020-02-15 NOTE — Telephone Encounter (Signed)
Pt referral for Physical Therapy was placed with [facility] order faxed to [facility] for review

## 2020-02-15 NOTE — Telephone Encounter (Signed)
Referral placed, pt is aware 

## 2020-02-15 NOTE — Telephone Encounter (Signed)
Pt is requesting a referral for physical therapy. Thanks

## 2020-02-18 ENCOUNTER — Other Ambulatory Visit: Payer: Self-pay | Admitting: Family Medicine

## 2020-02-21 ENCOUNTER — Telehealth: Payer: Self-pay | Admitting: Family Medicine

## 2020-02-21 NOTE — Telephone Encounter (Signed)
Pt is calling to follow up on a physical therapy referral. Thanks

## 2020-02-21 NOTE — Telephone Encounter (Signed)
Telephone note was not needed

## 2020-02-21 NOTE — Telephone Encounter (Signed)
Spoke with pt verbalized understanding that her referral to therapy is under review and pt should be receiving a call for scheduling.

## 2020-02-22 ENCOUNTER — Telehealth (INDEPENDENT_AMBULATORY_CARE_PROVIDER_SITE_OTHER): Payer: Medicaid Other | Admitting: Family Medicine

## 2020-02-22 DIAGNOSIS — K089 Disorder of teeth and supporting structures, unspecified: Secondary | ICD-10-CM

## 2020-02-22 DIAGNOSIS — G4700 Insomnia, unspecified: Secondary | ICD-10-CM

## 2020-02-22 DIAGNOSIS — M4714 Other spondylosis with myelopathy, thoracic region: Secondary | ICD-10-CM | POA: Diagnosis not present

## 2020-02-22 DIAGNOSIS — I10 Essential (primary) hypertension: Secondary | ICD-10-CM

## 2020-02-22 MED ORDER — TRAZODONE HCL 50 MG PO TABS: 25.0000 mg | ORAL_TABLET | Freq: Every evening | ORAL | 3 refills | Status: DC | PRN

## 2020-02-22 NOTE — Progress Notes (Signed)
Virtual Visit via Video Note  I connected with Ellen Hill on 02/22/20 at  2:30 PM EST by a video enabled telemedicine application 2/2 [id number] pandemic and verified that I am speaking with the correct person using two identifiers.  Location patient: home Location provider:work or home office Persons participating in the virtual visit: patient, provider  I discussed the limitations of evaluation and management by telemedicine and the availability of in person appointments. The patient expressed understanding and agreed to proceed.   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  Pt states she is doing better.  Trying to get PT arranged.  Notes increased pain with movement as her "nerves are waking up".  Getting more feeling in her legs. Has numbness and tingling in feet.  May take ibuprofen 800 mg QOD for pain.  Endorses slight dizziness and HAs that come out of the blue if she sits up to fast or moves in a certain direction.  Drinking a few bottles of water per day to avoid having to be changed as unable to ambulate to restroom.  Now continent.    Having issues with Medicaid covering transportation.    Needs to find a dentist, but is limited 2/2 transportation and insurance.  BP has been good, 135/80 at the highest.  Not sleeping well.  May get 2-3 hours per night. Not having to nap during the day but unable to fall asleep at night.  Was taking trazodone 50 mg, however would "knock" pt out.   ROS: See pertinent positives and negatives per HPI.  Past Medical History:  Diagnosis Date  . Abnormal Pap smear    history  . Anemia   . ASCUS (atypical squamous cells of undetermined significance) on Pap smear 2008   At 36wk of pregnancy ; colpo  . Asthma    rarely uses inhaler (seasonal)  . 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 Fibroids   . H/O candidiasis   . H/O fatigue 08/2007   . H/O varicella   . Headache(784.0)    migraines   . Heart murmur    dx child - no problems as adult  . Hx: UTI (urinary tract infection) 07/29/07  . Increased BMI   . Irregular bleeding   . Migraines   . Obesity 02/13/12  . Pelvic pain 02/13/12  . Postpartum anemia 08/18/07  . 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 after loss of parents, is fine  . Vaginal Pap smear, abnormal    f/u wnl    Past Surgical History:  Procedure Laterality Date  . CERVICAL POLYPECTOMY  05/01/2012   Procedure: CERVICAL POLYPECTOMY;  Surgeon: Betsy Coder, MD;  Location: [facility];  Service: Gynecology;  Laterality: N/A;  . CESAREAN SECTION  07/04/2007  . CHOLECYSTECTOMY  07/06/2013  . CHOLECYSTECTOMY N/A 07/06/2013   Procedure: LAPAROSCOPIC CHOLECYSTECTOMY ;  Surgeon: Gayland Curry, MD;  Location: [facility];  Service: General;  Laterality: N/A;  attempted cholangiogram  . fibroidecto    . fibroidectomy  07/04/2007  . LUMBAR LAMINECTOMY/DECOMPRESSION MICRODISCECTOMY N/A 01/19/2019   Procedure: Thoracic Nine-Lumbar One Decompressive Laminectomy;  Surgeon: Earnie Larsson, MD;  Location: [facility];  Service: Neurosurgery;  Laterality: N/A;  posterior  . MYOMECTOMY  07/04/08  . OPERATIVE HYSTEROSCOPY  05/01/2012   no menses since that time  . TUBAL LIGATION  07/04/2007    Family History  Problem Relation Age  of Onset  . Hypertension Mother   . Cancer Mother        Breast  . Breast cancer Mother   . Hypertension Father   . Cancer Father        Prostate  . Heart attack Father   . Hypertension Sister   . Diabetes Maternal Aunt     Current Outpatient Medications:  .  amoxicillin (AMOXIL) 500 MG capsule, Take 1 capsule (500 mg total) by mouth 3 (three) times daily., Disp: 30 capsule, Rfl: 0 .  baclofen (LIORESAL) 10 MG tablet, TAKE 1 TABLET TWICE A DAY AT 8 AM AND 1 PM WITH 5 MG TABLET, Disp: 60 tablet, Rfl: 1 .  baclofen (LIORESAL) 20 MG tablet, TAKE 1 TABLET EVERY DAY AT 6 PM, Disp: 30 tablet, Rfl: 1 .  gabapentin  (NEURONTIN) 400 MG capsule, TAKE 1 CAPSULE 3 TIMES A DAY AT 8 AM, 1 PM, AND 6 PM, Disp: 90 capsule, Rfl: 1 .  hydrochlorothiazide (HYDRODIURIL) 25 MG tablet, TAKE 1 TABLET BY MOUTH EVERY DAY, Disp: 30 tablet, Rfl: 1 .  HYDROcodone-acetaminophen (NORCO) 10-325 MG tablet, Take 1 tablet by mouth 3 (three) times daily as needed for moderate pain., Disp: , Rfl:  .  KLOR-CON M10 10 MEQ tablet, TAKE 1 TABLET TWICE A DAY AT 8 AM AND 8 PM, Disp: 180 tablet, Rfl: 1 .  lactulose (CHRONULAC) 10 GM/15ML solution, Take 30 mLs (20 g total) by mouth 2 (two) times daily., Disp: 236 mL, Rfl: 1 .  nystatin (MYCOSTATIN/NYSTOP) powder, APPLY TOPICALLY 2 TIMES DAILY AT 12 NOON AND 4 PM., Disp: 30 g, Rfl: 1 .  tamsulosin (FLOMAX) 0.4 MG CAPS capsule, TAKE 1 CAPSULE (0.4 MG TOTAL) BY MOUTH DAILY AFTER SUPPER., Disp: 30 capsule, Rfl: 1 .  vitamin C (ASCORBIC ACID) 500 MG tablet, Take 500 mg by mouth every evening., Disp: , Rfl:   EXAM:  VITALS per patient if applicable:  RR between 12-20 bpm  GENERAL: alert, oriented, appears well and in no acute distress  HEENT: atraumatic, conjunctiva clear, no obvious abnormalities on inspection of external nose and ears  NECK: normal movements of the head and neck  LUNGS: on inspection no signs of respiratory distress, breathing rate appears normal, no obvious gross SOB, gasping or wheezing  CV: no obvious cyanosis  MS: moves all visible extremities without noticeable abnormality  PSYCH/NEURO: pleasant and cooperative, no obvious depression or anxiety, speech and thought processing grossly intact  ASSESSMENT AND PLAN:  Discussed the following assessment and plan:  Insomnia, unspecified type  -sleep hygiene -will start trazodone 25 mg qhs prn - Plan: traZODone (DESYREL) 50 MG tablet  Thoracic myelopathy -paraplegia 2/2 thoracic meylopathy and cord compression s/p decompressive laminectomy 01/19/19 -HH PT to restart -continue exercises and stretching daily -continue  gabapentin 400 mg TID and baclofen  -continue f/u with PM&R  Essential hypertension -stable -continue current medications: HVTC 25 mg daily -continue lifestyle modificaitons  Tooth disease -stable -sent a list of area dental clinics via mychart   F/u in 1 month   I discussed the assessment and treatment plan with the patient. The patient was provided an opportunity to ask questions and all were answered. The patient agreed with the plan and demonstrated an understanding of the instructions.   The patient was advised to call back or seek an in-person evaluation if the symptoms worsen or if the condition fails to improve as anticipated.   Billie Ruddy, MD

## 2020-02-28 ENCOUNTER — Telehealth: Payer: Self-pay | Admitting: Family Medicine

## 2020-02-28 NOTE — Telephone Encounter (Signed)
Arnell Sieving from [facility] faxed over a Certificate for medical necessity on 3/2. They are needing it signed and faxed back at [phone]. They are also needing the latest visit note of the pt and any documents that will support the pt being approved for a wheel chair  If this was not received, please call [phone] ext [phone] for Arnell Sieving

## 2020-02-28 NOTE — Telephone Encounter (Signed)
Form is being reviewed by Dr Volanda Napoleon, will fax soon as complete

## 2020-03-01 ENCOUNTER — Telehealth: Payer: Self-pay | Admitting: *Deleted

## 2020-03-01 ENCOUNTER — Other Ambulatory Visit: Payer: Self-pay | Admitting: Family Medicine

## 2020-03-01 NOTE — Telephone Encounter (Signed)
Patient called office wanting to know why Baclofen was refused. Patient stated that pharmacy called her to let her know. Patient request a call back.

## 2020-03-02 NOTE — Telephone Encounter (Signed)
Arthur from [employer] called to check on the status of the form. Informed him that Ellen Hill is reviewing it and will fax soon as its complete. He wanted to remind PCP to also include latest visit notes and any medical records that will support the pt getting approved for a wheelchair.

## 2020-03-02 NOTE — Telephone Encounter (Signed)
Noted  

## 2020-03-02 NOTE — Telephone Encounter (Signed)
Spoke with pt pharmacy regarding pt refill, pharmacy verified that pt has enough refill. Called pt and advised her that the message she receiving is from an automated system and that she has enough refills at the pharmacy

## 2020-03-13 NOTE — Telephone Encounter (Signed)
Spoke with Arnell Sieving with [facility] confirmed that he received pt Certificate for Medical necessity.

## 2020-03-28 ENCOUNTER — Other Ambulatory Visit: Payer: Self-pay | Admitting: Family Medicine

## 2020-03-29 ENCOUNTER — Other Ambulatory Visit: Payer: Self-pay | Admitting: Family Medicine

## 2020-04-15 ENCOUNTER — Other Ambulatory Visit: Payer: Self-pay | Admitting: Family Medicine

## 2020-04-15 DIAGNOSIS — G4700 Insomnia, unspecified: Secondary | ICD-10-CM

## 2020-04-21 ENCOUNTER — Other Ambulatory Visit: Payer: Self-pay | Admitting: Family Medicine

## 2020-04-26 ENCOUNTER — Encounter: Payer: Medicaid Other | Admitting: Physical Medicine & Rehabilitation

## 2020-04-28 ENCOUNTER — Other Ambulatory Visit: Payer: Self-pay | Admitting: Family Medicine

## 2020-04-28 NOTE — Telephone Encounter (Signed)
Pt LOV was on 02/22/2020 and last refill was done on 02/11/2020 for 90 tablets with 11 refill. Pt takes 1 cap TID, please advise

## 2020-05-01 ENCOUNTER — Other Ambulatory Visit: Payer: Self-pay | Admitting: Family Medicine

## 2020-05-03 NOTE — Telephone Encounter (Signed)
The patient is call in regards to needing a PA for gabapentin (NEURONTIN) 400 MG capsule  [id number] - [place], [place] [address] Phone:  [phone]  Fax:  [phone]      Please advise

## 2020-05-03 NOTE — Telephone Encounter (Signed)
Please advise 

## 2020-05-03 NOTE — Telephone Encounter (Signed)
If PA needed ok to start.

## 2020-05-04 NOTE — Telephone Encounter (Signed)
Rx sent to pt pharmacy for refills

## 2020-05-23 ENCOUNTER — Other Ambulatory Visit: Payer: Self-pay | Admitting: Family Medicine

## 2020-05-24 ENCOUNTER — Encounter: Payer: Medicaid Other | Admitting: Physical Medicine & Rehabilitation

## 2020-05-25 ENCOUNTER — Other Ambulatory Visit: Payer: Self-pay | Admitting: Family Medicine

## 2020-05-31 ENCOUNTER — Encounter: Payer: Self-pay | Admitting: Family Medicine

## 2020-05-31 ENCOUNTER — Encounter: Payer: Medicaid Other | Admitting: Physical Medicine & Rehabilitation

## 2020-05-31 ENCOUNTER — Telehealth (INDEPENDENT_AMBULATORY_CARE_PROVIDER_SITE_OTHER): Payer: Medicaid Other | Admitting: Family Medicine

## 2020-05-31 DIAGNOSIS — M4714 Other spondylosis with myelopathy, thoracic region: Secondary | ICD-10-CM | POA: Diagnosis not present

## 2020-05-31 DIAGNOSIS — K5900 Constipation, unspecified: Secondary | ICD-10-CM | POA: Diagnosis not present

## 2020-05-31 MED ORDER — NYSTATIN 100000 UNIT/GM EX POWD: Freq: Two times a day (BID) | CUTANEOUS | 2 refills | Status: DC

## 2020-05-31 MED ORDER — LACTULOSE 10 GM/15ML PO SOLN: 20.0000 g | Freq: Two times a day (BID) | ORAL | 1 refills | Status: DC

## 2020-05-31 NOTE — Progress Notes (Signed)
Virtual Visit via Video Note  I connected with Ellen Hill on 05/31/20 at  4:00 PM EDT by a video enabled telemedicine application 2/2 [id number]-16 pandemic and verified that I am speaking with the correct person using two identifiers.  Location patient: home Location provider:work or home office Persons participating in the virtual visit: patient, provider  I discussed the limitations of evaluation and management by telemedicine and the availability of in person appointments. The patient expressed understanding and agreed to proceed.   HPI: Pt is a 49 yo female with pmh sig for h/o migraines, h/o lumbar reduction with radiculopathy s/p laminectomy, myelopathy, obesity who seen for f/u.  States she is doing ok.  Pt s/p laminectomy 01/17/2019 with prolonged recovery.  Now able to stand for a minute with assistance. Pt states she received a certified letter stating the her request for continuation of her wheel chair had been denied.   Pt also states medicaid denied continuing PT.  Pt states she may need a new referral to [facility] for PT.  Pt notes constipation.  Taking lactulose twice daily with little to no results.  May have a BM twice weekly.  Drinking 2-3 bottles of water per day.  Eating more vegetables and baked and barbecue chicken.  ROS: See pertinent positives and negatives per HPI.  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 after loss of parents, is fine   Vaginal Pap smear, abnormal    f/u wnl    Past Surgical History:  Procedure Laterality Date   CERVICAL POLYPECTOMY  05/01/2012   Procedure: CERVICAL POLYPECTOMY;  Surgeon: Betsy Coder, MD;  Location: [facility];  Service: Gynecology;  Laterality: N/A;   CESAREAN SECTION  07/04/2007   CHOLECYSTECTOMY  07/06/2013   CHOLECYSTECTOMY N/A 07/06/2013   Procedure: LAPAROSCOPIC CHOLECYSTECTOMY ;  Surgeon: Gayland Curry, MD;  Location: [place];  Service: General;  Laterality: N/A;  attempted cholangiogram   fibroidecto     fibroidectomy  07/04/2007   LUMBAR LAMINECTOMY/DECOMPRESSION MICRODISCECTOMY N/A 01/19/2019   Procedure: Thoracic Nine-Lumbar One Decompressive Laminectomy;  Surgeon: Earnie Larsson, MD;  Location: [place];  Service: Neurosurgery;  Laterality: N/A;  posterior   MYOMECTOMY  07/04/08   OPERATIVE HYSTEROSCOPY  05/01/2012   no menses since that time   TUBAL LIGATION  07/04/2007    Family History  Problem Relation Age of Onset   Hypertension Mother    Cancer Mother        Breast   Breast cancer Mother    Hypertension Father    Cancer Father        Prostate   Heart attack Father    Hypertension Sister    Diabetes Maternal Aunt  Current Outpatient Medications:    amoxicillin (AMOXIL) 500 MG capsule, Take 1 capsule (500 mg total) by mouth 3 (three) times daily., Disp: 30 capsule, Rfl: 0   baclofen (LIORESAL) 10 MG tablet, TAKE 1 TABLET TWICE A DAY AT 8 AM AND 1 PM WITH 5 MG TABLET, Disp: 60 tablet, Rfl: 1   baclofen (LIORESAL) 20 MG tablet, TAKE 1 TABLET EVERY DAY AT 6 PM, Disp: 30 tablet, Rfl: 1   gabapentin (NEURONTIN) 400 MG capsule, TAKE 1 CAPSULE 3 TIMES A DAY AT 8 AM, 1 PM, AND 6 PM, Disp: 270 capsule, Rfl: 1   hydrochlorothiazide (HYDRODIURIL) 25 MG tablet, TAKE 1 TABLET BY MOUTH EVERY DAY, Disp: 30 tablet, Rfl: 1   HYDROcodone-acetaminophen (NORCO) 10-325 MG tablet, Take 1 tablet  by mouth 3 (three) times daily as needed for moderate pain., Disp: , Rfl:    ibuprofen (ADVIL) 800 MG tablet, TAKE 1 TABLET BY MOUTH THREE TIMES A DAY, Disp: 90 tablet, Rfl: 1   KLOR-CON M10 10 MEQ tablet, TAKE 1 TABLET TWICE A DAY AT 8 AM AND 8 PM, Disp: 180 tablet, Rfl: 1   lactulose (CHRONULAC) 10 GM/15ML solution, Take 30 mLs (20 g total) by mouth 2 (two) times daily., Disp: 236 mL, Rfl: 1   nystatin (MYCOSTATIN/NYSTOP) powder, APPLY TOPICALLY 2 TIMES DAILY AT 12 NOON AND 4 PM., Disp: 30 g, Rfl: 1   tamsulosin (FLOMAX) 0.4 MG CAPS capsule, TAKE 1 CAPSULE (0.4 MG TOTAL) BY MOUTH DAILY AFTER SUPPER., Disp: 30 capsule, Rfl: 1   traZODone (DESYREL) 50 MG tablet, TAKE 0.5 TABLETS (25 MG TOTAL) BY MOUTH AT BEDTIME AS NEEDED FOR SLEEP., Disp: 45 tablet, Rfl: 3   vitamin C (ASCORBIC ACID) 500 MG tablet, Take 500 mg by mouth every evening., Disp: , Rfl:   EXAM:  VITALS per patient if applicable: RR between 82-64 bpm  GENERAL: alert, oriented, appears well and in no acute distress  HEENT: atraumatic, conjunctiva clear, no obvious abnormalities on inspection of external nose and ears  NECK: normal movements of the head and neck  LUNGS: on inspection no signs of respiratory distress, breathing rate appears normal, no obvious gross SOB, gasping or wheezing  CV: no obvious cyanosis  PSYCH/NEURO: pleasant and cooperative, no obvious depression or anxiety, speech and thought processing grossly intact  ASSESSMENT AND PLAN:  Discussed the following assessment and plan:  Thoracic myelopathy -Continue current medications -Continue PT with HH as patient unable to leave the house 2/2 weakness deconditioning. -Continue follow-up with [facility], Dr. Naaman Plummer  Constipation, unspecified constipation type -Discussed diet modifications -Discussed increasing p.o. intake of water -Okay to use MiraLAX as needed  Discussed follow-up in person however patient limited 2/2 transportation.  Follow-up in the  next few months, sooner if needed   I discussed the assessment and treatment plan with the patient. The patient was provided an opportunity to ask questions and all were answered. The patient agreed with the plan and demonstrated an understanding of the instructions.   The patient was advised to call back or seek an in-person evaluation if the symptoms worsen or if the condition fails to improve as anticipated.  I provided 11 minutes of non-face-to-face time during this encounter.   Billie Ruddy, MD

## 2020-06-07 ENCOUNTER — Other Ambulatory Visit: Payer: Self-pay | Admitting: Family Medicine

## 2020-06-14 ENCOUNTER — Other Ambulatory Visit: Payer: Self-pay | Admitting: Family Medicine

## 2020-07-11 ENCOUNTER — Telehealth: Payer: Self-pay | Admitting: Family Medicine

## 2020-07-11 NOTE — Telephone Encounter (Signed)
Pt needs [place] Dept of Health and [employer] form filled out. Placed in red folder.  Pt would like it faxed to [phone] when completed and then mailed to her at [place] [id number]

## 2020-07-13 ENCOUNTER — Telehealth: Payer: Self-pay | Admitting: Family Medicine

## 2020-07-13 NOTE — Telephone Encounter (Signed)
Vinnie Level from [employer] tated the pt wants to have PT to build up some strength and would like a referral sent for PT to whom ever the PCP decided.   Pt can be reached at [place] can be reached at [phone]

## 2020-07-14 NOTE — Telephone Encounter (Signed)
Pt stated she would like a return call regarding the home health.    Pt can be reached at [phone]

## 2020-07-14 NOTE — Telephone Encounter (Signed)
Pt form has been received and placed on Dr Volanda Napoleon desk for completion, pt will be notified when form is ready

## 2020-07-16 ENCOUNTER — Other Ambulatory Visit: Payer: Self-pay | Admitting: Family Medicine

## 2020-07-18 NOTE — Telephone Encounter (Signed)
Pt form for Health and Human Services is complete and faxed as requested

## 2020-07-20 NOTE — Telephone Encounter (Signed)
Spoke with pt requests for Physical Therapy/Home Health with a different agency. [place] for the orders

## 2020-07-21 ENCOUNTER — Telehealth: Payer: Self-pay

## 2020-07-21 DIAGNOSIS — Y93B9 Activity, other involving muscle strengthening exercises: Secondary | ICD-10-CM

## 2020-07-21 NOTE — Telephone Encounter (Signed)
Ok

## 2020-07-21 NOTE — Telephone Encounter (Signed)
Referral to therapy placed

## 2020-07-21 NOTE — Telephone Encounter (Signed)
Referral placed to Therapy

## 2020-07-23 ENCOUNTER — Other Ambulatory Visit: Payer: Self-pay | Admitting: Family Medicine

## 2020-07-31 ENCOUNTER — Telehealth: Payer: Self-pay | Admitting: Family Medicine

## 2020-07-31 NOTE — Telephone Encounter (Signed)
Pt stated she updated her Medicaid plan to the healthy blue but the insurance company informed her that she would need a new PCP because they cannot find [facility] in their system. Informed pt that from my understanding we do not accept medcaid as primary but if existing pt's switch to medicaid then we would still accept them. Pt asked what she needs to do? Informed her that I will get with my manager in regards to this when she comes back and have her reach out.   Pt can be reached at [phone]

## 2020-08-08 ENCOUNTER — Telehealth: Payer: Self-pay | Admitting: Family Medicine

## 2020-08-08 NOTE — Telephone Encounter (Signed)
Pt is calling in stating that she would like to have home health PT and would like to see if Dr. Volanda Napoleon can set that up for her.

## 2020-08-09 NOTE — Telephone Encounter (Signed)
Okay for  home health PT referral

## 2020-08-10 NOTE — Telephone Encounter (Signed)
Advised patient on how to set up healthy blue for [place] offices.

## 2020-08-14 NOTE — Telephone Encounter (Signed)
[facility] for [facility] PT.  Order from 7/30 placed for PT with outpt clinic however difficult for pt to make appt.

## 2020-08-16 ENCOUNTER — Other Ambulatory Visit: Payer: Self-pay

## 2020-08-16 DIAGNOSIS — M4714 Other spondylosis with myelopathy, thoracic region: Secondary | ICD-10-CM

## 2020-08-16 DIAGNOSIS — M5416 Radiculopathy, lumbar region: Secondary | ICD-10-CM

## 2020-08-16 NOTE — Telephone Encounter (Signed)
Refferal to [facility] placed, pt is aware to receive a call regarding scheduling

## 2020-08-18 ENCOUNTER — Telehealth: Payer: Self-pay

## 2020-08-18 NOTE — Telephone Encounter (Signed)
Spoke with [facility] referral coordinator regarding pt referral state that pt HH/PT referral has been denied due to having Medicaid, state that multiple agencies have denied referral, spoke with pt advised to contact medicaid to provide names of Agency that approved Medicaid, pt verbalized understanding

## 2020-08-22 ENCOUNTER — Other Ambulatory Visit: Payer: Self-pay | Admitting: Family Medicine

## 2020-08-29 ENCOUNTER — Telehealth: Payer: Self-pay | Admitting: Family Medicine

## 2020-08-29 NOTE — Telephone Encounter (Signed)
Ellen Hill from [place] called to say she found [facility] has an opening for physical therapy next Monday for the pt.  Need a physical therapy form sent to them  [address]  Phone [phone]  Fax. [phone]  Please advise

## 2020-09-07 ENCOUNTER — Telehealth: Payer: Self-pay

## 2020-09-07 NOTE — Telephone Encounter (Signed)
[facility] for new patient OV? Patient has seen Dr. Naaman Plummer in the past. I did not find her in [facility].

## 2020-09-07 NOTE — Telephone Encounter (Signed)
Ok ov just make sure to tell her we are not medication mgt and or make not seen by other pain

## 2020-09-07 NOTE — Telephone Encounter (Signed)
Patient called she is having pain in lower back she wants an appointment with Dr.Newton call [phone]

## 2020-09-08 ENCOUNTER — Other Ambulatory Visit: Payer: Self-pay

## 2020-09-08 ENCOUNTER — Telehealth: Payer: Self-pay

## 2020-09-08 ENCOUNTER — Telehealth: Payer: Self-pay | Admitting: Family Medicine

## 2020-09-08 DIAGNOSIS — Y93B9 Activity, other involving muscle strengthening exercises: Secondary | ICD-10-CM

## 2020-09-08 DIAGNOSIS — M5416 Radiculopathy, lumbar region: Secondary | ICD-10-CM

## 2020-09-08 NOTE — Telephone Encounter (Signed)
Patient called returning call, phone number listed is her sisters number I have updated her number in her chart. Patient wants to know if she can be seen today. Call back:[phone].

## 2020-09-08 NOTE — Telephone Encounter (Signed)
Called patient to schedule OV. She states that she does not know who Dr. Ernestina Patches is and knows nothing about the call.

## 2020-09-08 NOTE — Telephone Encounter (Signed)
Pt referral for Physical Therapy was faxed to [facility] as requested

## 2020-09-08 NOTE — Telephone Encounter (Signed)
[facility] called to let Dr. Volanda Napoleon know that they had to decline the referral due to staffing.  Their schedule is full for PT and her OT is on vacation

## 2020-09-11 ENCOUNTER — Telehealth: Payer: Self-pay | Admitting: Family Medicine

## 2020-09-11 NOTE — Telephone Encounter (Signed)
Patient's sister called and stated that she received a call for the pt on her phone-- the phone number has been corrected in the system so the pt will need to be called again.

## 2020-09-11 NOTE — Telephone Encounter (Signed)
Pt call and want a call back to talk  about her physical therapy.

## 2020-09-11 NOTE — Telephone Encounter (Signed)
Left pt a detailed message regarding [facility] declining her referral.

## 2020-09-12 ENCOUNTER — Other Ambulatory Visit: Payer: Self-pay

## 2020-09-12 ENCOUNTER — Telehealth: Payer: Self-pay | Admitting: Family Medicine

## 2020-09-12 DIAGNOSIS — M5416 Radiculopathy, lumbar region: Secondary | ICD-10-CM

## 2020-09-12 DIAGNOSIS — Y93B9 Activity, other involving muscle strengthening exercises: Secondary | ICD-10-CM

## 2020-09-12 NOTE — Telephone Encounter (Signed)
pt wants to know if she needs an order for lab work, please advise  [phone]

## 2020-09-12 NOTE — Telephone Encounter (Signed)
Spoke with pt aware that [facility] declined her referral for physical therapy. Pt is aware that we placed another referral for her with [facility]

## 2020-09-12 NOTE — Telephone Encounter (Signed)
This message is resolved

## 2020-09-15 ENCOUNTER — Other Ambulatory Visit: Payer: Self-pay | Admitting: Family Medicine

## 2020-09-15 NOTE — Telephone Encounter (Signed)
Rx has one more refill at the pharmacy

## 2020-09-18 NOTE — Telephone Encounter (Signed)
Spoke with pt state that she does not need any labs done.

## 2020-09-18 NOTE — Telephone Encounter (Signed)
Pt referral to [facility] was placed, awaiting [facility] Referral coordinator to send over paperwork for approval

## 2020-09-20 NOTE — Telephone Encounter (Signed)
Pt referral to [facility] was denied. Pt has been notified of the reason for the denial. Verbalized understanding.

## 2020-10-07 ENCOUNTER — Other Ambulatory Visit: Payer: Self-pay | Admitting: Family Medicine

## 2020-10-10 ENCOUNTER — Telehealth: Payer: Self-pay

## 2020-10-10 NOTE — Telephone Encounter (Signed)
Patient called in to refill her medication pharmacy has already sent two request    gabapentin (NEURONTIN) 400 MG capsule  [id number] - [place],  - [address] AT [place]

## 2020-10-11 ENCOUNTER — Other Ambulatory Visit: Payer: Self-pay | Admitting: Family Medicine

## 2020-10-17 NOTE — Telephone Encounter (Signed)
Rx sent to pt pharmacy 

## 2020-10-25 ENCOUNTER — Other Ambulatory Visit: Payer: Self-pay

## 2020-10-25 ENCOUNTER — Encounter: Payer: Self-pay | Admitting: Family Medicine

## 2020-10-25 ENCOUNTER — Telehealth (INDEPENDENT_AMBULATORY_CARE_PROVIDER_SITE_OTHER): Payer: Medicaid Other | Admitting: Family Medicine

## 2020-10-25 DIAGNOSIS — G959 Disease of spinal cord, unspecified: Secondary | ICD-10-CM | POA: Diagnosis not present

## 2020-10-25 DIAGNOSIS — N319 Neuromuscular dysfunction of bladder, unspecified: Secondary | ICD-10-CM | POA: Diagnosis not present

## 2020-10-25 DIAGNOSIS — I10 Essential (primary) hypertension: Secondary | ICD-10-CM | POA: Diagnosis not present

## 2020-10-25 DIAGNOSIS — G4700 Insomnia, unspecified: Secondary | ICD-10-CM | POA: Diagnosis not present

## 2020-10-25 DIAGNOSIS — R0683 Snoring: Secondary | ICD-10-CM

## 2020-10-25 MED ORDER — HYDROCHLOROTHIAZIDE 25 MG PO TABS: 25.0000 mg | ORAL_TABLET | Freq: Every day | ORAL | 3 refills | Status: DC

## 2020-10-25 NOTE — Progress Notes (Signed)
Virtual Visit via Video Note  I connected with Ellen Hill on 10/25/20 at  2:00 PM EDT by a video enabled telemedicine application 2/2 [id number] pandemic and verified that I am speaking with the correct person using two identifiers.  Location patient: home Location provider:work or home office Persons participating in the virtual visit: patient, provider  I discussed the limitations of evaluation and management by telemedicine and the availability of in person appointments. The patient expressed understanding and agreed to proceed.   HPI: Pt is a 49 yo female with pmh sig for migraines, asthma, HTN, thoracic myelopathy s/p laminectomy (01/19/2019), neurogenic bowel, obesity, and anemia of chronic dz who was seen for f/u.  Pt notes she is doing ok.  Using a hoyer lift and wheelchair to get up and out.  Pt was getting HH PT, but they do not enough staff to continue services.  Pt still doing exercises at home.  Notices her L leg is not bending without assistance.  This has been going on since the feeling started coming back into her leg.  Pt can now stand on her own with a walker.  She is unable to take any steps and cannot stand for long.  Pt notes improvement in bladder.  Pt with insomnia.  Stays in bed the majority of the day.  Endorses snoring.  BP 845X-646 systolic on days with increased pain, otherwise bp 130s/80s.  Pt taking HCTZ 25 mg for bp.  Denies myscle cramps, but having spasms in legs.  Taking baclofen which helps.  Pt inquires about outpt PT.  Looking into transport options.  ROS: See pertinent positives and negatives per HPI.  Past Medical History:  Diagnosis Date  . Abnormal Pap smear    history  . Anemia   . ASCUS (atypical squamous cells of undetermined significance) on Pap smear 2008   At 36wk of pregnancy ; colpo  . Asthma    rarely uses inhaler (seasonal)  . 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 Fibroids   . H/O candidiasis   . H/O fatigue 08/2007  . H/O varicella   .  Headache(784.0)    migraines   . Heart murmur    dx child - no problems as adult  . Hx: UTI (urinary tract infection) 07/29/07  . Increased BMI   . Irregular bleeding   . Migraines   . Obesity 02/13/12  . Pelvic pain 02/13/12  . Postpartum anemia 08/18/07  . 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 after loss of parents, is fine  . Vaginal Pap smear, abnormal    f/u wnl    Past Surgical History:  Procedure Laterality Date  . CERVICAL POLYPECTOMY  05/01/2012   Procedure: CERVICAL POLYPECTOMY;  Surgeon: Betsy Coder, MD;  Location: [facility];  Service: Gynecology;  Laterality: N/A;  . CESAREAN SECTION  07/04/2007  . CHOLECYSTECTOMY  07/06/2013  . CHOLECYSTECTOMY N/A 07/06/2013   Procedure: LAPAROSCOPIC CHOLECYSTECTOMY ;  Surgeon: Gayland Curry, MD;  Location: [facility];  Service: General;  Laterality: N/A;  attempted cholangiogram  . fibroidecto    . fibroidectomy  07/04/2007  . LUMBAR LAMINECTOMY/DECOMPRESSION MICRODISCECTOMY N/A 01/19/2019   Procedure: Thoracic Nine-Lumbar One Decompressive Laminectomy;  Surgeon: Earnie Larsson, MD;  Location: [facility];  Service: Neurosurgery;  Laterality: N/A;  posterior  . MYOMECTOMY  07/04/08  . OPERATIVE HYSTEROSCOPY  05/01/2012   no menses since that time  . TUBAL LIGATION  07/04/2007    Family History  Problem Relation Age of Onset  .  Hypertension Mother   . Cancer Mother        Breast  . Breast cancer Mother   . Hypertension Father   . Cancer Father        Prostate  . Heart attack Father   . Hypertension Sister   . Diabetes Maternal Aunt       Current Outpatient Medications:  .  amoxicillin (AMOXIL) 500 MG capsule, Take 1 capsule (500 mg total) by mouth 3 (three) times daily., Disp: 30 capsule, Rfl: 0 .  baclofen (LIORESAL) 10 MG tablet, TAKE 1 TABLET TWICE A DAY AT 8 AM AND 1 PM WITH 5 MG TABLET, Disp: 60 tablet, Rfl: 1 .  baclofen (LIORESAL) 20 MG tablet, TAKE 1 TABLET EVERY DAY AT 6 PM, Disp: 30 tablet, Rfl: 1 .  gabapentin (NEURONTIN) 400 MG  capsule, TAKE 1 CAPSULE 3 TIMES A DAY AT 8 AM, 1 PM, AND 6 PM, Disp: 270 capsule, Rfl: 1 .  hydrochlorothiazide (HYDRODIURIL) 25 MG tablet, TAKE 1 TABLET BY MOUTH EVERY DAY, Disp: 30 tablet, Rfl: 3 .  HYDROcodone-acetaminophen (NORCO) 10-325 MG tablet, Take 1 tablet by mouth 3 (three) times daily as needed for moderate pain., Disp: , Rfl:  .  ibuprofen (ADVIL) 800 MG tablet, TAKE 1 TABLET BY MOUTH THREE TIMES A DAY, Disp: 270 tablet, Rfl: 0 .  lactulose (CHRONULAC) 10 GM/15ML solution, Take 30 mLs (20 g total) by mouth 2 (two) times daily., Disp: 236 mL, Rfl: 1 .  nystatin (MYCOSTATIN/NYSTOP) powder, Apply topically 2 times daily at 12 noon and 4 pm., Disp: 30 g, Rfl: 2 .  tamsulosin (FLOMAX) 0.4 MG CAPS capsule, TAKE 1 CAPSULE (0.4 MG TOTAL) BY MOUTH DAILY AFTER SUPPER., Disp: 30 capsule, Rfl: 3 .  traZODone (DESYREL) 50 MG tablet, TAKE 0.5 TABLETS (25 MG TOTAL) BY MOUTH AT BEDTIME AS NEEDED FOR SLEEP., Disp: 45 tablet, Rfl: 3 .  vitamin C (ASCORBIC ACID) 500 MG tablet, Take 500 mg by mouth every evening., Disp: , Rfl:   EXAM:  VITALS per patient if applicable: RR between 54-27 bpm  GENERAL: alert, oriented, appears well and in no acute distress  HEENT: atraumatic, conjunctiva clear, no obvious abnormalities on inspection of external nose and ears  NECK: normal movements of the head and neck  LUNGS: on inspection no signs of respiratory distress, breathing rate appears normal, no obvious gross SOB, gasping or wheezing  CV: no obvious cyanosis  MS: moves all visible extremities without noticeable abnormality  PSYCH/NEURO: pleasant and cooperative, no obvious depression or anxiety, speech and thought processing grossly intact  ASSESSMENT AND PLAN:  Discussed the following assessment and plan:  Insomnia, unspecified type -discussed moving from the bed to the living room during the day. -discussed sleep hygiene -order placed for sleep study  Myelopathy [facility])  -improving s/p  laminectomy -will place referal to outpt [facility] PT services interrupted 2/2 staffing shortage -continue exercises at home.  - Plan: Ambulatory referral to Physical Therapy  Neurogenic bladder -improving -Incontinence supplies prn  Essential hypertension  -elevated -discussed weight loss and lifestyle modifications including diet changes. - Plan: hydrochlorothiazide (HYDRODIURIL) 25 MG tablet  Snores  - Plan: PSG Sleep Study  F/u in 1 month, sooner if needed.   I discussed the assessment and treatment plan with the patient. The patient was provided an opportunity to ask questions and all were answered. The patient agreed with the plan and demonstrated an understanding of the instructions.   The patient was advised to call back  or seek an in-person evaluation if the symptoms worsen or if the condition fails to improve as anticipated.   Billie Ruddy, MD

## 2020-10-31 ENCOUNTER — Encounter: Payer: Self-pay | Admitting: Family Medicine

## 2020-10-31 DIAGNOSIS — G4700 Insomnia, unspecified: Secondary | ICD-10-CM | POA: Insufficient documentation

## 2020-11-02 ENCOUNTER — Encounter: Payer: Self-pay | Admitting: Physical Therapy

## 2020-11-02 ENCOUNTER — Ambulatory Visit: Payer: Medicaid Other | Attending: Family Medicine | Admitting: Physical Therapy

## 2020-11-02 ENCOUNTER — Other Ambulatory Visit: Payer: Self-pay

## 2020-11-02 DIAGNOSIS — G959 Disease of spinal cord, unspecified: Secondary | ICD-10-CM | POA: Insufficient documentation

## 2020-11-02 DIAGNOSIS — R2689 Other abnormalities of gait and mobility: Secondary | ICD-10-CM | POA: Diagnosis present

## 2020-11-02 DIAGNOSIS — M6281 Muscle weakness (generalized): Secondary | ICD-10-CM

## 2020-11-02 NOTE — Therapy (Signed)
[place], [place], [address] Phone: [phone]   Fax:  [phone]  Physical Therapy Evaluation  Patient Details  Name: Ellen Hill MRN: [id number] Date of Birth: Mar 06, 1971 Referring Provider (PT): Grier Mitts MD   Encounter Date: 11/02/2020   PT End of Session - 11/02/20 0943    Visit Number 1    Number of Visits 1    Date for PT Re-Evaluation 11/03/20    Authorization Type [employer]    PT Start Time 0947    PT Stop Time 1030    PT Time Calculation (min) 43 min    Activity Tolerance Patient tolerated treatment well    Behavior During Therapy [facility] for tasks assessed/performed           Past Medical History:  Diagnosis Date  . Abnormal Pap smear    history  . Anemia   . ASCUS (atypical squamous cells of undetermined significance) on Pap smear 2008   At 36wk of pregnancy ; colpo  . Asthma    rarely uses inhaler (seasonal)  . 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 Fibroids   . H/O candidiasis   . H/O fatigue 08/2007  . H/O varicella   . Headache(784.0)    migraines   . Heart murmur    dx child - no problems as adult  . Hx: UTI (urinary tract infection) 07/29/07  . Increased BMI   . Irregular bleeding   . Migraines   . Obesity 02/13/12  . Pelvic pain 02/13/12  . Postpartum anemia 08/18/07  . 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 after loss of parents, is fine  . Vaginal Pap smear, abnormal    f/u wnl    Past Surgical History:  Procedure Laterality Date  . CERVICAL POLYPECTOMY  05/01/2012   Procedure: CERVICAL POLYPECTOMY;  Surgeon: Betsy Coder, MD;  Location: [facility] [facility];  Service: Gynecology;  Laterality: N/A;  . CESAREAN SECTION  07/04/2007  . CHOLECYSTECTOMY  07/06/2013  . CHOLECYSTECTOMY N/A 07/06/2013   Procedure: LAPAROSCOPIC CHOLECYSTECTOMY ;  Surgeon: Gayland Curry, MD;  Location: [place];  Service: General;  Laterality: N/A;  attempted cholangiogram  . fibroidecto    .  fibroidectomy  07/04/2007  . LUMBAR LAMINECTOMY/DECOMPRESSION MICRODISCECTOMY N/A 01/19/2019   Procedure: Thoracic Nine-Lumbar One Decompressive Laminectomy;  Surgeon: Earnie Larsson, MD;  Location: [place];  Service: Neurosurgery;  Laterality: N/A;  posterior  . MYOMECTOMY  07/04/08  . OPERATIVE HYSTEROSCOPY  05/01/2012   no menses since that time  . TUBAL LIGATION  07/04/2007    There were no vitals filed for this visit.    Subjective Assessment - 11/02/20 0953    Subjective pt is a 49 y.o F with CC of LE weakness that started since the back surgery 2 years ago. she reports having HHPT since September of 2020 which stoppedin March 2021 due to short staffing.  pt reports she uses a hoyer lift with transfers from bed to chair. She reports she is able to stand with assistance for about a 1 min reporting that her arms get tired. pt currently denies any pain most weakness    Patient Stated Goals gain strength in the legs to transfer, improve mobility.    Currently in Pain? No/denies              [facility] PT Assessment - 11/02/20 0001      Assessment   Medical Diagnosis Myelopathy ([place]) G95.9    Referring  Provider (PT) Grier Mitts MD    Onset Date/Surgical Date --   2 years ago   Hand Dominance Left    Next MD Visit in the next month    Prior Therapy --   HHPT      Precautions   Precautions None      Restrictions   Weight Bearing Restrictions No      Balance Screen   Has the patient fallen in the past 6 months No    Has the patient had a decrease in activity level because of a fear of falling?  No    Is the patient reluctant to leave their home because of a fear of falling?  No      Home Ecologist residence    Living Arrangements Children;Other relatives    Available Help at Discharge Family    Type of [place] to enter    Entrance Stairs-Number of Steps 6    Entrance Stairs-Rails None    Home Layout Two level     Alternate Level Stairs-Number of Steps 18    Alternate Level Stairs-Rails Can reach both    Home Equipment Wheelchair - manual;Shower seat;Bedside commode;Walker - 2 wheels;Hospital bed   hoyer lift, trapeze bar     Prior Function   Level of Independence Needs assistance with ADLs;Needs assistance with homemaking;Needs assistance with transfers    Toileting Maximal    Dressing Maximal    Grooming Maximal    Comments uses a hoyer lift with transfers      Posture/Postural Control   Posture/Postural Control Postural limitations    Postural Limitations Rounded Shoulders;Forward head    Posture Comments bil LE in abduction postion with the L knee extended      ROM / Strength   AROM / PROM / Strength AROM;Strength      AROM   Overall AROM Comments limited knee ROM secondary to soft tissue approximation R>L seconarty to increased stiffness    AROM Assessment Site Hip;Knee      Strength   Strength Assessment Site Hip;Knee;Ankle    Right/Left Hip Right;Left    Right Hip Flexion 2+/5    Right Hip Extension --   unable to assess   Right Hip ABduction 2+/5    Left Hip Flexion 2+/5    Left Hip Extension --   unable to assess   Left Hip ABduction 2+/5    Right/Left Knee Right;Left    Right Knee Flexion 3/5    Right Knee Extension 3+/5   in availble ROM    Left Knee Flexion 3/5    Left Knee Extension 3+/5   in availble ROM    Right/Left Ankle Right;Left    Right Ankle Dorsiflexion 3+/5    Right Ankle Plantar Flexion 3+/5    Right Ankle Inversion 3/5    Right Ankle Eversion 3/5    Left Ankle Dorsiflexion 3-/5    Left Ankle Plantar Flexion 3-/5    Left Ankle Inversion 3-/5    Left Ankle Eversion 3-/5      Palpation   Palpation comment No specific areas of tenderness      Transfers   Transfers Sit to Stand    Sit to Stand Multiple attempts   unable to fully assess- unable to perform total A +1   Transfer via Art therapist   at home   Comments pt requires use of a hoyer  lift with  transfers to transfer from bed to her W/C      Ambulation/Gait   Ambulation/Gait No                      Objective measurements completed on examination: See above findings.               PT Education - 11/02/20 1043    Education Details evaluation assessment, and progress exercise to promote hip/ knee strenghtening as well as bil UE. She was provided a comprehensive HEP and instructed on proper progression.    Person(s) Educated Patient    Methods Explanation;Verbal cues;Handout    Comprehension Verbalized understanding;Verbal cues required                       Plan - 11/02/20 1049    Clinical Impression Statement pt presents to OPPT with CC of general LE weakness started 2 years ago since her back surgery. she has been undergoing HHPT starting in September of 2020- march 2021 noting it is due to staff shortages. Pt notes required use of hoyer lift at home for all transfer. pt demonstates gross weakness in bil LE and limited knee ROM with the L worse than the R with increased stiffness. She reported she is able to stand with her RW at home for short periods of time, however in the assessment PT was unable get pt  standing with total assist +1 with RW in front after multiple attempts. Based on today's assessment Mrs Capshaw doesn't possess the functional strength and mobiltiy / capability that would be required to successfully partake in outpatient physical therapy safely at this time, and would gain her max benefit from Home health physical therapy based on her current level of function.   This was discussed with the patient and she verbalized understanding.    Personal Factors and Comorbidities Comorbidity 3+;Age;Past/Current Experience;Time since onset of injury/illness/exacerbation    Comorbidities hx of chronic LE weakness, asthma, heart murmur,    Examination-Activity Limitations Bend;Stairs;Squat;Bathing;Toileting;Locomotion  Level;Lift;Sit;Carry;Stand    Examination-Participation Restrictions Cleaning    Stability/Clinical Decision Making Unstable/Unpredictable    Clinical Decision Making High    Rehab Potential Poor    PT Frequency One time visit           Patient will benefit from skilled therapeutic intervention in order to improve the following deficits and impairments:  Obesity, Postural dysfunction, Improper body mechanics, Decreased strength, Decreased mobility, Decreased activity tolerance, Decreased endurance, Decreased range of motion, Difficulty walking, Decreased knowledge of use of DME, Decreased balance, Decreased safety awareness  Visit Diagnosis: Myelopathy (HCC)  Muscle weakness (generalized)  Other abnormalities of gait and mobility     Problem List Patient Active Problem List   Diagnosis Date Noted  . Insomnia 10/31/2020  . Essential hypertension 10/25/2020  . Dysuria   . Super obesity   . Anemia of chronic disease   . Leukocytosis   . Neurogenic bowel   . Neurogenic bladder   . E. coli UTI   . Myelopathy ([place]) 01/29/2019  . 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 Lumbar back pain with radiculopathy affecting lower extremity 12/08/2018  . Dysmenorrhea 04/13/2012  . Class 3 severe obesity due to excess calories without serious comorbidity with body mass index (BMI) of 60.0 to 69.9 in adult ([place]) 04/13/2012  . Anemia 04/13/2012  . Fibroids 04/13/2012  . Menorrhagia 03/07/2012   Starr Lake PT, DPT, LAT, ATC  11/02/20  11:05 AM  [place], [place], [address] Phone: [phone]   Fax:  [phone]  Name: Ellen Hill MRN: [id number] Date of Birth: 08/30/1971

## 2020-11-03 ENCOUNTER — Telehealth: Payer: Self-pay

## 2020-11-03 NOTE — Telephone Encounter (Signed)
Patient called in wanting nurse to call patient back regarding her PT appt she had. States they were sending over some information and she wanted to follow up with the nurse about the paperwork .   Please call and advise

## 2020-11-14 NOTE — Telephone Encounter (Signed)
Spoke with pt advised that I will call around to some [place] and see if they accept pt insurance.Will call pt back

## 2020-11-15 ENCOUNTER — Other Ambulatory Visit: Payer: Self-pay | Admitting: Family Medicine

## 2020-11-24 ENCOUNTER — Telehealth: Payer: Self-pay | Admitting: Family Medicine

## 2020-11-24 NOTE — Telephone Encounter (Signed)
Ellen Hill from [employer] stated that the pt needs a physical therapy referral for strengthening [facility]. would you please call [phone]

## 2020-11-28 NOTE — Telephone Encounter (Signed)
Okay for referral?

## 2020-12-01 ENCOUNTER — Telehealth: Payer: Self-pay | Admitting: Family Medicine

## 2020-12-01 NOTE — Telephone Encounter (Signed)
Aware pt needs PT.  Was doing it through [facility], however it was stopped as they were short staffed.  If pt is able to do out pt therapy that is fine, but transportation is likely an issue.  Pt would benefit from rehab at a SNF.

## 2020-12-01 NOTE — Telephone Encounter (Signed)
Pt is calling to see if she can get a call back concerning your PT.

## 2020-12-02 ENCOUNTER — Other Ambulatory Visit: Payer: Self-pay | Admitting: Family Medicine

## 2020-12-04 NOTE — Telephone Encounter (Signed)
Attempted to return a call for Ellen Hill with [facility], not able to leave message, will try again later

## 2020-12-04 NOTE — Telephone Encounter (Signed)
See other telephone note.  

## 2020-12-06 ENCOUNTER — Other Ambulatory Visit: Payer: Self-pay

## 2020-12-06 DIAGNOSIS — Y93B9 Activity, other involving muscle strengthening exercises: Secondary | ICD-10-CM

## 2020-12-06 NOTE — Telephone Encounter (Signed)
Spoke with Manuela Schwartz from [employer] regarding pt state that she spoke with [facility] regarding pt PT and advised for pt PCP to send referral to them, pt state that she is not sure if she can get Transportation for outpatient  SNF. Spoke with our office referral coordinator who state that pt referral was denied previously due to [facility] not taking pt Insurance. [place] office referral coordinator placed a new referral again today, awaiting to hear back if pt insurance approves.

## 2021-01-11 ENCOUNTER — Telehealth (INDEPENDENT_AMBULATORY_CARE_PROVIDER_SITE_OTHER): Payer: Medicaid Other | Admitting: Family Medicine

## 2021-01-11 ENCOUNTER — Encounter: Payer: Self-pay | Admitting: Family Medicine

## 2021-01-11 DIAGNOSIS — Z7409 Other reduced mobility: Secondary | ICD-10-CM

## 2021-01-11 DIAGNOSIS — I10 Essential (primary) hypertension: Secondary | ICD-10-CM | POA: Diagnosis not present

## 2021-01-11 DIAGNOSIS — G959 Disease of spinal cord, unspecified: Secondary | ICD-10-CM | POA: Diagnosis not present

## 2021-01-11 DIAGNOSIS — L8990 Pressure ulcer of unspecified site, unspecified stage: Secondary | ICD-10-CM

## 2021-01-11 MED ORDER — BACLOFEN 20 MG PO TABS: ORAL_TABLET | ORAL | 4 refills | Status: DC

## 2021-01-11 MED ORDER — BACLOFEN 10 MG PO TABS: ORAL_TABLET | ORAL | 4 refills | Status: DC

## 2021-01-11 NOTE — Progress Notes (Signed)
Virtual Visit via Video Note  I connected with [name] on 01/11/21 at  1:00 PM EST by a video enabled telemedicine application 2/2 [id number] pandemic and verified that I am speaking with the correct person using two identifiers.  Location patient: home Location provider:work or home office Persons participating in the virtual visit: patient, provider  I discussed the limitations of evaluation and management by telemedicine and the availability of in person appointments. The patient expressed understanding and agreed to proceed.   HPI: Pt is a 50 year old female with pmh sig for HTN, migraines, asthma, neurogenic bowel, myelopathy, h/o lumbar radiculopathy s/p T9-L1 decompressive laminectomy, obesity, anemia who was seen for follow-up.  Pt with bed sores. Using a protective gel, trying to get bandages and gloves through [facility], but needs an rx.  Also notes a sore underneath her R breast.  Area drains blood at times, has "an open hole, a small hole".  Notes the area on the bottom of her L leg was present since she was in the SNF.   Pt able to stand with walker, but unable to stand and pivot.  BP 133-144/78-90.  Pt states her mood is good.  Pt needs refill on baclofen.    ROS: See pertinent positives and negatives per HPI.  Past Medical History:  Diagnosis Date  . Abnormal Pap smear    history  . Anemia   . ASCUS (atypical squamous cells of undetermined significance) on Pap smear 2008   At 36wk of pregnancy ; colpo  . Asthma    rarely uses inhaler (seasonal)  . 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 Fibroids   . H/O candidiasis   . H/O fatigue 08/2007  . H/O varicella   . Headache(784.0)    migraines   . Heart murmur    dx child - no problems as adult  . Hx: UTI (urinary tract infection) 07/29/07  . Increased BMI   . Irregular bleeding   . Migraines   . Obesity 02/13/12  . Pelvic pain 02/13/12  . Postpartum anemia 08/18/07  . 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 after loss of  parents, is fine  . Vaginal Pap smear, abnormal    f/u wnl    Past Surgical History:  Procedure Laterality Date  . CERVICAL POLYPECTOMY  05/01/2012   Procedure: CERVICAL POLYPECTOMY;  Surgeon: Betsy Coder, MD;  Location: [facility];  Service: Gynecology;  Laterality: N/A;  . CESAREAN SECTION  07/04/2007  . CHOLECYSTECTOMY  07/06/2013  . CHOLECYSTECTOMY N/A 07/06/2013   Procedure: LAPAROSCOPIC CHOLECYSTECTOMY ;  Surgeon: Gayland Curry, MD;  Location: [place];  Service: General;  Laterality: N/A;  attempted cholangiogram  . fibroidecto    . fibroidectomy  07/04/2007  . LUMBAR LAMINECTOMY/DECOMPRESSION MICRODISCECTOMY N/A 01/19/2019   Procedure: Thoracic Nine-Lumbar One Decompressive Laminectomy;  Surgeon: Earnie Larsson, MD;  Location: [place];  Service: Neurosurgery;  Laterality: N/A;  posterior  . MYOMECTOMY  07/04/08  . OPERATIVE HYSTEROSCOPY  05/01/2012   no menses since that time  . TUBAL LIGATION  07/04/2007    Family History  Problem Relation Age of Onset  . Hypertension Mother   . Cancer Mother        Breast  . Breast cancer Mother   . Hypertension Father   . Cancer Father        Prostate  . Heart attack Father   . Hypertension Sister   . Diabetes Maternal Aunt      Current Outpatient Medications:  .  baclofen (LIORESAL) 10 MG tablet, TAKE 1 TABLET TWICE A DAY AT 8 AM AND 1 PM WITH 5 MG TABLET, Disp: 60 tablet, Rfl: 2 .  baclofen (LIORESAL) 20 MG tablet, TAKE 1 TABLET EVERY DAY AT 6 PM, Disp: 30 tablet, Rfl: 2 .  gabapentin (NEURONTIN) 400 MG capsule, TAKE 1 CAPSULE 3 TIMES A DAY AT 8 AM, 1 PM, AND 6 PM, Disp: 270 capsule, Rfl: 1 .  hydrochlorothiazide (HYDRODIURIL) 25 MG tablet, Take 1 tablet (25 mg total) by mouth daily., Disp: 90 tablet, Rfl: 3 .  ibuprofen (ADVIL) 800 MG tablet, TAKE 1 TABLET BY MOUTH THREE TIMES A DAY, Disp: 270 tablet, Rfl: 0 .  nystatin (MYCOSTATIN/NYSTOP) powder, Apply topically 2 times daily at 12 noon and 4 pm., Disp: 30 g, Rfl: 2 .  tamsulosin (FLOMAX)  0.4 MG CAPS capsule, TAKE 1 CAPSULE (0.4 MG TOTAL) BY MOUTH DAILY AFTER SUPPER., Disp: 90 capsule, Rfl: 1  EXAM:  VITALS per patient if applicable: RR between 03-50 bpm  GENERAL: alert, oriented, appears well and in no acute distress  HEENT: atraumatic, conjunctiva clear, no obvious abnormalities on inspection of external nose and ears  NECK: normal movements of the head and neck  LUNGS: on inspection no signs of respiratory distress, breathing rate appears normal, no obvious gross SOB, gasping or wheezing  CV: no obvious cyanosis  MS: moves all visible extremities without noticeable abnormality  PSYCH/NEURO: pleasant and cooperative, no obvious depression or anxiety, speech and thought processing grossly intact  ASSESSMENT AND PLAN:  Discussed the following assessment and plan:  Myelopathy (HCC) -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 -s/p T9-L1 decompressive laminectomy 01/19/2019 -Now continent of bowel and bladder -Continue stretching exercises -Continue baclofen 10 mg at 8 AM and 1 PM, 20 mg at 6 PM and gabapentin 400 mg 3 times daily - Plan: baclofen (LIORESAL) 10 MG tablet, baclofen (LIORESAL) 20 MG tablet, Ambulatory referral to Home Health  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  Will work on transportation options versus having labs drawn with home health - Plan: Ambulatory referral to [place]  -Patient referred to home health in the past, however 2/2 COVID-19 pandemic, staffing issues, and difficulty with transportation 2/2 current level of mobility pt has not had recent  therapy. -Discussed SNF for rehab.  Patient is open to this idea.  We will look into placement options. -Continue stretching exercises daily - Plan: Ambulatory referral to [place]  Follow-up in the next few weeks, sooner if needed   I discussed the assessment and treatment plan with the patient. The patient was provided an opportunity to ask questions and all were answered. The patient agreed with the plan and demonstrated an understanding of the instructions.   The patient was advised to call back or seek an in-person evaluation if the symptoms worsen or if the condition fails to improve as anticipated.  Billie Ruddy, MD

## 2021-01-29 ENCOUNTER — Encounter: Payer: Self-pay | Admitting: Family Medicine

## 2021-02-02 ENCOUNTER — Telehealth: Payer: Self-pay | Admitting: Family Medicine

## 2021-02-02 NOTE — Telephone Encounter (Signed)
Patient called to follow up on a referral for Ellen Hill  I advised the patient that we are trying to find her an agency that excepts her insurance.

## 2021-02-15 ENCOUNTER — Encounter (HOSPITAL_BASED_OUTPATIENT_CLINIC_OR_DEPARTMENT_OTHER): Payer: Medicaid Other | Admitting: Internal Medicine

## 2021-03-09 ENCOUNTER — Other Ambulatory Visit: Payer: Self-pay | Admitting: Family Medicine

## 2021-04-20 ENCOUNTER — Telehealth (INDEPENDENT_AMBULATORY_CARE_PROVIDER_SITE_OTHER): Payer: Medicaid Other | Admitting: Family Medicine

## 2021-04-20 ENCOUNTER — Encounter: Payer: Self-pay | Admitting: Family Medicine

## 2021-04-20 DIAGNOSIS — M4714 Other spondylosis with myelopathy, thoracic region: Secondary | ICD-10-CM | POA: Diagnosis not present

## 2021-04-20 DIAGNOSIS — R0683 Snoring: Secondary | ICD-10-CM

## 2021-04-20 DIAGNOSIS — G959 Disease of spinal cord, unspecified: Secondary | ICD-10-CM

## 2021-04-20 DIAGNOSIS — L8990 Pressure ulcer of unspecified site, unspecified stage: Secondary | ICD-10-CM | POA: Diagnosis not present

## 2021-04-20 DIAGNOSIS — R262 Difficulty in walking, not elsewhere classified: Secondary | ICD-10-CM

## 2021-04-20 NOTE — Progress Notes (Signed)
Virtual Visit via Video Note  I connected with Ellen Hill on 04/20/21 at  1:00 PM EDT by a video enabled telemedicine application 2/2 [id number] pandemic and verified that I am speaking with the correct person using two identifiers.  Location patient: home Location provider:work or home office Persons participating in the virtual visit: patient, provider  I discussed the limitations of evaluation and management by telemedicine and the availability of in person appointments. The patient expressed understanding and agreed to proceed.   HPI: Pt is a 50 yo female with pmh sig for HTN, migraines, asthma, h/o lumbar radiculopathy s/p T9-L1 decompressive laminectomy, obesity, neurogenic bowel, myelopathy seen for f/u on chronic conditions.  Pt requesting a new PT eval as she is unable to get PT in home as she has not been able to find a company that accepts her insurance.  Pt states her insurance has a preferred list of outpt PT providers. They want pt to come in for PT but only when she is able to stand on her own.  Pt currently only able to stand for a shot period of time with walker.  Unable to stand and pivot. Transportation would also be an issues for pt if doing out patient therapy.  Endorses bedsores.  Previously had HH, but they stopped coming to house 2/2 COVID-19 pandemic causing staff shortages.  Pt tries to do exercises daily while in bed and sitting on side of bed.  Endorses continued insomnia.   ROS: See pertinent positives and negatives per HPI.  Past Medical History:  Diagnosis Date  . Abnormal Pap smear    history  . Anemia   . ASCUS (atypical squamous cells of undetermined significance) on Pap smear 2008   At 36wk of pregnancy ; colpo  . Asthma    rarely uses inhaler (seasonal)  . 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 Fibroids   . H/O candidiasis   . H/O fatigue 08/2007  . H/O varicella   . Headache(784.0)    migraines   . Heart murmur    dx child - no problems as adult  .  Hx: UTI (urinary tract infection) 07/29/07  . Increased BMI   . Irregular bleeding   . Migraines   . Obesity 02/13/12  . Pelvic pain 02/13/12  . Postpartum anemia 08/18/07  . 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 after loss of parents, is fine  . Vaginal Pap smear, abnormal    f/u wnl    Past Surgical History:  Procedure Laterality Date  . CERVICAL POLYPECTOMY  05/01/2012   Procedure: CERVICAL POLYPECTOMY;  Surgeon: Betsy Coder, MD;  Location: [facility];  Service: Gynecology;  Laterality: N/A;  . CESAREAN SECTION  07/04/2007  . CHOLECYSTECTOMY  07/06/2013  . CHOLECYSTECTOMY N/A 07/06/2013   Procedure: LAPAROSCOPIC CHOLECYSTECTOMY ;  Surgeon: Gayland Curry, MD;  Location: [facility];  Service: General;  Laterality: N/A;  attempted cholangiogram  . fibroidecto    . fibroidectomy  07/04/2007  . LUMBAR LAMINECTOMY/DECOMPRESSION MICRODISCECTOMY N/A 01/19/2019   Procedure: Thoracic Nine-Lumbar One Decompressive Laminectomy;  Surgeon: Earnie Larsson, MD;  Location: [facility];  Service: Neurosurgery;  Laterality: N/A;  posterior  . MYOMECTOMY  07/04/08  . OPERATIVE HYSTEROSCOPY  05/01/2012   no menses since that time  . TUBAL LIGATION  07/04/2007    Family History  Problem Relation Age of Onset  . Hypertension Mother   . Cancer Mother        Breast  . Breast cancer Mother   .  Hypertension Father   . Cancer Father        Prostate  . Heart attack Father   . Hypertension Sister   . Diabetes Maternal Aunt       Current Outpatient Medications:  .  baclofen (LIORESAL) 10 MG tablet, TAKE 1 TABLET TWICE A DAY AT 8 AM AND 1 PM WITH 5 MG TABLET, Disp: 60 tablet, Rfl: 4 .  baclofen (LIORESAL) 20 MG tablet, TAKE 1 TABLET EVERY DAY AT 6 PM, Disp: 30 tablet, Rfl: 4 .  gabapentin (NEURONTIN) 400 MG capsule, TAKE 1 CAPSULE 3 TIMES A DAY AT 8 AM, 1 PM, AND 6 PM, Disp: 270 capsule, Rfl: 1 .  hydrochlorothiazide (HYDRODIURIL) 25 MG tablet, Take 1 tablet (25 mg total) by mouth daily., Disp: 90 tablet, Rfl: 3 .   ibuprofen (ADVIL) 800 MG tablet, TAKE 1 TABLET BY MOUTH THREE TIMES A DAY, Disp: 270 tablet, Rfl: 0 .  nystatin (MYCOSTATIN/NYSTOP) powder, Apply topically 2 times daily at 12 noon and 4 pm., Disp: 30 g, Rfl: 2 .  tamsulosin (FLOMAX) 0.4 MG CAPS capsule, TAKE 1 CAPSULE (0.4 MG TOTAL) BY MOUTH DAILY AFTER SUPPER., Disp: 90 capsule, Rfl: 1  EXAM:  VITALS per patient if applicable:  RR between 12-20 bpm  GENERAL: alert, oriented, appears well and in no acute distress  HEENT: atraumatic, conjunctiva clear, no obvious abnormalities on inspection of external nose and ears  NECK: normal movements of the head and neck  LUNGS: on inspection no signs of respiratory distress, breathing rate appears normal, no obvious gross SOB, gasping or wheezing  CV: no obvious cyanosis  MS: moves all visible extremities without noticeable abnormality  PSYCH/NEURO: pleasant and cooperative, no obvious depression or anxiety, speech and thought processing grossly intact  ASSESSMENT AND PLAN:  Discussed the following assessment and plan:  Thoracic myelopathy - Plan: Ambulatory referral to Home Health  Myelopathy [facility]) - Plan: Ambulatory referral to Home Health  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  At this time pt needs in home therapies, but insurance acceptance causing issues.  Pt unable to leave home 2/2 mobility and transportation issues as would require a hoyer lift and skilled transport.  If unable to find [facility] assistance  will need to explore option of inpt rehab at a SNF.     I discussed the assessment and treatment plan with the patient. The patient was provided an opportunity to ask questions and all were answered. The patient agreed with the plan and demonstrated an understanding of the instructions.   The patient was advised to call back or seek an in-person evaluation if the symptoms worsen or if the condition fails to improve as anticipated.  Billie Ruddy, MD

## 2021-05-03 ENCOUNTER — Other Ambulatory Visit: Payer: Self-pay | Admitting: Family Medicine

## 2021-05-05 ENCOUNTER — Encounter: Payer: Self-pay | Admitting: Family Medicine

## 2021-05-09 ENCOUNTER — Telehealth: Payer: Self-pay | Admitting: Family Medicine

## 2021-05-09 ENCOUNTER — Encounter: Payer: Self-pay | Admitting: Family Medicine

## 2021-05-09 DIAGNOSIS — M5416 Radiculopathy, lumbar region: Secondary | ICD-10-CM

## 2021-05-09 DIAGNOSIS — Z7409 Other reduced mobility: Secondary | ICD-10-CM

## 2021-05-09 DIAGNOSIS — R262 Difficulty in walking, not elsewhere classified: Secondary | ICD-10-CM

## 2021-05-09 NOTE — Telephone Encounter (Signed)
Dr. Volanda Napoleon, I sent this referral to several different agencies, And no one can assist her; this is the recent response that I got back from [facility] pt would like to know what the next step in care is  Carney Harder, [facility]  we are staffing challenged and cannot help at this time. Thank you. Cari Caraway

## 2021-05-27 ENCOUNTER — Other Ambulatory Visit: Payer: Self-pay | Admitting: Family Medicine

## 2021-06-12 NOTE — Telephone Encounter (Signed)
Referral placed.

## 2021-07-26 DIAGNOSIS — M5416 Radiculopathy, lumbar region: Secondary | ICD-10-CM | POA: Diagnosis not present

## 2021-07-26 DIAGNOSIS — M4714 Other spondylosis with myelopathy, thoracic region: Secondary | ICD-10-CM | POA: Diagnosis not present

## 2021-07-26 DIAGNOSIS — R32 Unspecified urinary incontinence: Secondary | ICD-10-CM | POA: Diagnosis not present

## 2021-07-26 DIAGNOSIS — G959 Disease of spinal cord, unspecified: Secondary | ICD-10-CM | POA: Diagnosis not present

## 2021-07-30 ENCOUNTER — Telehealth: Payer: Self-pay | Admitting: Family Medicine

## 2021-07-30 ENCOUNTER — Telehealth: Payer: Medicare HMO | Admitting: Family Medicine

## 2021-08-02 NOTE — Telephone Encounter (Signed)
PT needs a refill of their lactulose (CHRONULAC) 10 GM/15ML solution  and are wondering why the medicine was denied. She is currently out of them.

## 2021-08-03 NOTE — Addendum Note (Signed)
Addended by: Anderson Malta on: 08/03/2021 05:06 PM   Modules accepted: Orders

## 2021-08-06 ENCOUNTER — Telehealth (INDEPENDENT_AMBULATORY_CARE_PROVIDER_SITE_OTHER): Payer: Medicare HMO | Admitting: Family Medicine

## 2021-08-06 ENCOUNTER — Encounter: Payer: Self-pay | Admitting: Family Medicine

## 2021-08-06 DIAGNOSIS — K5900 Constipation, unspecified: Secondary | ICD-10-CM | POA: Diagnosis not present

## 2021-08-06 DIAGNOSIS — Z7409 Other reduced mobility: Secondary | ICD-10-CM | POA: Diagnosis not present

## 2021-08-06 DIAGNOSIS — Z9889 Other specified postprocedural states: Secondary | ICD-10-CM

## 2021-08-06 DIAGNOSIS — M4714 Other spondylosis with myelopathy, thoracic region: Secondary | ICD-10-CM | POA: Diagnosis not present

## 2021-08-06 DIAGNOSIS — R5381 Other malaise: Secondary | ICD-10-CM | POA: Diagnosis not present

## 2021-08-06 NOTE — Progress Notes (Signed)
Virtual Visit via Video Note Visit started via video, however switched to telephone due to connectivity issues.  I connected with Ellen Hill on 08/06/21 at  1:30 PM EDT by a video enabled telemedicine application 2/2 [id number] pandemic and verified that I am speaking with the correct person using two identifiers.  Location patient: home Location provider:work or home office Persons participating in the virtual visit: patient, provider  I discussed the limitations of evaluation and management by telemedicine and the availability of in person appointments. The patient expressed understanding and agreed to proceed.   HPI: Pt is a 50 yo female with pmh sig for HTN, migraines, asthma, h/o lumbar radiculopathy s/p T9-L1 decompressive laminectomy, myelopathy, morbid obesity, neurogenic bowel seen for f/u and acute concern.    Pt unable to have a BM without using a suppository.  Started a few wks ago.  Feels miserable due to bloating.   Feels the stools are stuck and will not move down.  Suppository causes liquid stools.  No changes in diet.  Denies issues with urination.  Still having issues with bed sore on lower leg.  Area will heal then reoccur.  Patient denies erythema, drainage, increased warmth to area.  Pt was finally able to get medicare.  Previously had issues obtaining PT/home health services 2/2 insurance carrier and staffing shortages due to [place].  Pt able to stand at bedside with walker for a short period of time.  Unable to pivot.  Inquires about inpatient rehab services.  ROS: See pertinent positives and negatives per HPI.  Social: Patient lives at home with her daughter.  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 after loss of parents, is fine   Vaginal Pap smear, abnormal    f/u wnl    Past Surgical History:  Procedure Laterality Date   CERVICAL POLYPECTOMY  05/01/2012   Procedure: CERVICAL POLYPECTOMY;  Surgeon: Betsy Coder, MD;  Location: [facility];  Service: Gynecology;  Laterality: N/A;   CESAREAN SECTION  07/04/2007   CHOLECYSTECTOMY  07/06/2013   CHOLECYSTECTOMY N/A 07/06/2013   Procedure: LAPAROSCOPIC CHOLECYSTECTOMY ;  Surgeon: Gayland Curry, MD;  Location: [facility];  Service: General;  Laterality: N/A;  attempted cholangiogram   fibroidecto     fibroidectomy  07/04/2007   LUMBAR LAMINECTOMY/DECOMPRESSION MICRODISCECTOMY N/A 01/19/2019   Procedure: Thoracic Nine-Lumbar One Decompressive Laminectomy;  Surgeon: Earnie Larsson, MD;  Location: [facility];  Service: Neurosurgery;  Laterality: N/A;  posterior   MYOMECTOMY  07/04/08   OPERATIVE HYSTEROSCOPY  05/01/2012   no menses since that time   TUBAL LIGATION  07/04/2007    Family History  Problem Relation Age of Onset   Hypertension Mother    Cancer Mother  Breast   Breast cancer Mother    Hypertension Father    Cancer Father        Prostate   Heart attack Father    Hypertension Sister    Diabetes Maternal Aunt      Current Outpatient Medications:    baclofen (LIORESAL) 10 MG tablet, TAKE 1 TABLET TWICE A DAY AT 8 AM AND 1 PM WITH 5 MG TABLET, Disp: 60 tablet, Rfl: 4   baclofen (LIORESAL) 20 MG tablet, TAKE 1 TABLET EVERY DAY AT 6 PM, Disp: 30 tablet, Rfl: 4   gabapentin (NEURONTIN) 400 MG capsule, TAKE 1 CAPSULE 3 TIMES A DAY AT 8 AM, 1 PM, AND 6 PM, Disp: 270 capsule, Rfl: 1   hydrochlorothiazide (HYDRODIURIL) 25 MG  tablet, Take 1 tablet (25 mg total) by mouth daily., Disp: 90 tablet, Rfl: 3   ibuprofen (ADVIL) 800 MG tablet, TAKE 1 TABLET BY MOUTH THREE TIMES A DAY, Disp: 270 tablet, Rfl: 0   nystatin (MYCOSTATIN/NYSTOP) powder, APPLY TOPICALLY 2 TIMES DAILY AT 12 NOON AND 4 PM., Disp: 30 g, Rfl: 2   tamsulosin (FLOMAX) 0.4 MG CAPS capsule, TAKE 1 CAPSULE (0.4 MG TOTAL) BY MOUTH DAILY AFTER SUPPER. (Patient not taking: Reported on 08/06/2021), Disp: 90 capsule, Rfl: 1  EXAM:  VITALS per patient if applicable:  RR between 12-20 bpm.  GENERAL: alert, oriented, appears well and in no acute distress  HEENT: atraumatic, conjunctiva clear, no obvious abnormalities on inspection of external nose and ears  NECK: normal movements of the head and neck  LUNGS: on inspection no signs of respiratory distress, breathing rate appears normal, no obvious gross SOB, gasping or wheezing  CV: no obvious cyanosis  MS: moves all visible extremities without noticeable abnormality  PSYCH/NEURO: pleasant and cooperative, no obvious depression or anxiety, speech and thought processing grossly intact  ASSESSMENT AND PLAN:  Discussed the following assessment and plan:  Thoracic myelopathy  Morbid obesity (HCC)  Physical deconditioning  Constipation, unspecified constipation type  Decreased mobility and endurance -Unable to leave home 2/2 mobility and transportation issues  Hx of decompressive lumbar laminectomy -surgery 01/19/2019  Concern for stool impaction and delayed healing of bedsore on LE.  Given ongoing issues pt advised to proceed to nearest ED for further evaluation including labs.  Given continued delays in care, patient would benefit from inpatient rehab to improve mobility.  Questions answered to satisfaction.  Follow-up as needed  I discussed the assessment and treatment plan with the patient. The patient was provided an opportunity to ask questions and all were answered. The patient agreed with  the plan and demonstrated an understanding of the instructions.   The patient was advised to call back or seek an in-person evaluation if the symptoms worsen or if the condition fails to improve as anticipated.  I provided 14 minutes of non-face-to-face time during this encounter.   Billie Ruddy, MD

## 2021-08-08 ENCOUNTER — Other Ambulatory Visit: Payer: Self-pay | Admitting: Family Medicine

## 2021-08-10 ENCOUNTER — Other Ambulatory Visit: Payer: Self-pay | Admitting: Family Medicine

## 2021-08-13 ENCOUNTER — Other Ambulatory Visit: Payer: Self-pay

## 2021-08-13 ENCOUNTER — Telehealth: Payer: Self-pay | Admitting: Family Medicine

## 2021-08-13 NOTE — Telephone Encounter (Signed)
Called pharmacy, they had not rec'd the electronic refill, refill was placed over the phone. Called pt, informed her they are preparing it and should be ready later today.

## 2021-08-13 NOTE — Telephone Encounter (Signed)
Patient is requesting a refill for Lactulose 10 GM/15ML to be sent to her pharmacy.   [id number] [place] [id number], [id number]  Patient is requesting to be contacted at [phone] complete.  Please advise.

## 2021-08-29 ENCOUNTER — Telehealth (INDEPENDENT_AMBULATORY_CARE_PROVIDER_SITE_OTHER): Payer: Medicare HMO | Admitting: Family Medicine

## 2021-08-29 ENCOUNTER — Encounter: Payer: Self-pay | Admitting: Family Medicine

## 2021-08-29 DIAGNOSIS — G959 Disease of spinal cord, unspecified: Secondary | ICD-10-CM

## 2021-08-29 DIAGNOSIS — L89899 Pressure ulcer of other site, unspecified stage: Secondary | ICD-10-CM

## 2021-08-29 DIAGNOSIS — Z7409 Other reduced mobility: Secondary | ICD-10-CM | POA: Diagnosis not present

## 2021-08-29 DIAGNOSIS — R5381 Other malaise: Secondary | ICD-10-CM

## 2021-08-29 NOTE — Progress Notes (Signed)
Virtual Visit via Video Note  I connected with Ellen Hill on 08/29/21 at  1:30 PM EDT by a video enabled telemedicine application 2/2 [id number] pandemic and verified that I am speaking with the correct person using two identifiers.  Location patient: home Location provider:work or home office Persons participating in the virtual visit: patient, provider  I discussed the limitations of evaluation and management by telemedicine and the availability of in person appointments. The patient expressed understanding and agreed to proceed.   HPI: Pt is a 50 yo female with pmh sig for HTN, migraines, asthma, h/o lumbar radiculopathy s/p T9-L1 decompressive laminectomy, myelopathy, morbid obesity, neurogenic bowel seen for f/u.  Working on transportation to go to ED for evaluation and possible rehab placement.  Pt has a nurse visit on Friday with a nurse from her insurance company.    Pt with continued sore on LLE.  The area has a scab on it at the moment.  Pt keeps a bandage on it to prevent it from re-opening.  Not having any back pain.  Having weakness in LEs. Still unable to move around.    Pt having a BM daily since restarting lactulose.  Pt's daughter is a Museum/gallery exhibitions officer at [employer].    ROS: See pertinent positives and negatives per HPI.  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 after loss of parents, is fine    Vaginal Pap smear, abnormal    f/u wnl    Past Surgical History:  Procedure Laterality Date   CERVICAL POLYPECTOMY  05/01/2012   Procedure: CERVICAL POLYPECTOMY;  Surgeon: Betsy Coder, MD;  Location: [facility];  Service: Gynecology;  Laterality: N/A;   CESAREAN SECTION  07/04/2007   CHOLECYSTECTOMY  07/06/2013   CHOLECYSTECTOMY N/A 07/06/2013   Procedure: LAPAROSCOPIC CHOLECYSTECTOMY ;  Surgeon: Gayland Curry, MD;  Location: [facility];  Service: General;  Laterality: N/A;  attempted cholangiogram   fibroidecto     fibroidectomy  07/04/2007   LUMBAR LAMINECTOMY/DECOMPRESSION MICRODISCECTOMY N/A 01/19/2019   Procedure: Thoracic Nine-Lumbar One Decompressive Laminectomy;  Surgeon: Earnie Larsson, MD;  Location: [facility];  Service: Neurosurgery;  Laterality: N/A;  posterior   MYOMECTOMY  07/04/08   OPERATIVE HYSTEROSCOPY  05/01/2012   no menses since that time   TUBAL LIGATION  07/04/2007    Family History  Problem Relation Age of Onset   Hypertension Mother    Cancer Mother        Breast   Breast cancer Mother    Hypertension Father    Cancer Father        Prostate   Heart attack Father    Hypertension Sister    Diabetes Maternal Aunt     Current Outpatient  Medications:    baclofen (LIORESAL) 10 MG tablet, TAKE 1 TABLET TWICE A DAY AT 8 AM AND 1 PM WITH 5 MG TABLET, Disp: 60 tablet, Rfl: 4   baclofen (LIORESAL) 20 MG tablet, TAKE 1 TABLET EVERY DAY AT 6 PM, Disp: 30 tablet, Rfl: 4   gabapentin (NEURONTIN) 400 MG capsule, TAKE 1 CAPSULE 3 TIMES A DAY AT 8 AM, 1 PM, AND 6 PM, Disp: 270 capsule, Rfl: 1   hydrochlorothiazide (HYDRODIURIL) 25 MG tablet, Take 1 tablet (25 mg total) by mouth daily., Disp: 90 tablet, Rfl: 3   ibuprofen (ADVIL) 800 MG tablet, TAKE 1 TABLET BY MOUTH THREE TIMES A DAY, Disp: 270 tablet, Rfl: 0   lactulose (CHRONULAC) 10 GM/15ML solution, TAKE 30 MLS (20 G TOTAL) BY MOUTH 2 (TWO) TIMES DAILY., Disp: 236 mL, Rfl: 1   nystatin (MYCOSTATIN/NYSTOP) powder, APPLY TOPICALLY  2 TIMES DAILY AT 12 NOON AND 4 PM., Disp: 30 g, Rfl: 2   tamsulosin (FLOMAX) 0.4 MG CAPS capsule, TAKE 1 CAPSULE (0.4 MG TOTAL) BY MOUTH DAILY AFTER SUPPER. (Patient not taking: Reported on 08/06/2021), Disp: 90 capsule, Rfl: 1  EXAM:  VITALS per patient if applicable:  RR between 12-20 bpm  GENERAL: alert, oriented, appears well and in no acute distress  HEENT: atraumatic, conjunctiva clear, no obvious abnormalities on inspection of external nose and ears  NECK: normal movements of the head and neck  LUNGS: on inspection no signs of respiratory distress, breathing rate appears normal, no obvious gross SOB, gasping or wheezing  CV: no obvious cyanosis  MS: moves all visible extremities without noticeable abnormality  PSYCH/NEURO: pleasant and cooperative, no obvious depression or anxiety, speech and thought processing grossly intact  ASSESSMENT AND PLAN:  Discussed the following assessment and plan:  Myelopathy (HCC) -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  Decreased mobility and endurance -2/2 immobility status post T9-L1 decompressive laminectomy 01/19/2019  Physical deconditioning -Stable.  - Discussed with patient the need for inpatient rehab as previous home health agencies unable to see patient for numerous reasons including insurance carrier and limited staff 2/2  COVID-19 pandemic. -We will continue to look for placement options.  Discussed ED for further evaluation if needed. -We will see if insurance agency can help with placement on Friday.  Patient to contact clinic in regards to this.  Follow-up as needed   I discussed the assessment and treatment plan with the patient. The patient was provided an opportunity to ask questions and all were answered. The patient agreed with the plan and demonstrated an understanding of the instructions.   The patient was advised to call back or seek an in-person evaluation if the symptoms worsen or if the condition fails to improve as anticipated.   Billie Ruddy, MD

## 2021-08-31 DIAGNOSIS — M4714 Other spondylosis with myelopathy, thoracic region: Secondary | ICD-10-CM | POA: Diagnosis not present

## 2021-08-31 DIAGNOSIS — Z8249 Family history of ischemic heart disease and other diseases of the circulatory system: Secondary | ICD-10-CM | POA: Diagnosis not present

## 2021-08-31 DIAGNOSIS — M5416 Radiculopathy, lumbar region: Secondary | ICD-10-CM | POA: Diagnosis not present

## 2021-08-31 DIAGNOSIS — G629 Polyneuropathy, unspecified: Secondary | ICD-10-CM | POA: Diagnosis not present

## 2021-08-31 DIAGNOSIS — Z803 Family history of malignant neoplasm of breast: Secondary | ICD-10-CM | POA: Diagnosis not present

## 2021-08-31 DIAGNOSIS — Z800 Family history of malignant neoplasm of digestive organs: Secondary | ICD-10-CM | POA: Diagnosis not present

## 2021-08-31 DIAGNOSIS — K5900 Constipation, unspecified: Secondary | ICD-10-CM | POA: Diagnosis not present

## 2021-08-31 DIAGNOSIS — I10 Essential (primary) hypertension: Secondary | ICD-10-CM | POA: Diagnosis not present

## 2021-08-31 DIAGNOSIS — Z7409 Other reduced mobility: Secondary | ICD-10-CM | POA: Diagnosis not present

## 2021-08-31 DIAGNOSIS — M62838 Other muscle spasm: Secondary | ICD-10-CM | POA: Diagnosis not present

## 2021-08-31 DIAGNOSIS — Z791 Long term (current) use of non-steroidal anti-inflammatories (NSAID): Secondary | ICD-10-CM | POA: Diagnosis not present

## 2021-08-31 DIAGNOSIS — G8929 Other chronic pain: Secondary | ICD-10-CM | POA: Diagnosis not present

## 2021-08-31 DIAGNOSIS — G959 Disease of spinal cord, unspecified: Secondary | ICD-10-CM | POA: Diagnosis not present

## 2021-08-31 DIAGNOSIS — R32 Unspecified urinary incontinence: Secondary | ICD-10-CM | POA: Diagnosis not present

## 2021-08-31 DIAGNOSIS — R600 Localized edema: Secondary | ICD-10-CM | POA: Diagnosis not present

## 2021-09-06 ENCOUNTER — Other Ambulatory Visit: Payer: Self-pay | Admitting: Family Medicine

## 2021-09-06 DIAGNOSIS — G959 Disease of spinal cord, unspecified: Secondary | ICD-10-CM

## 2021-09-12 ENCOUNTER — Telehealth: Payer: Self-pay | Admitting: Family Medicine

## 2021-09-12 ENCOUNTER — Telehealth: Payer: Medicare HMO | Admitting: Family Medicine

## 2021-09-12 NOTE — Telephone Encounter (Signed)
PT called to advise that she is calling to provide info to [facility] nurse in regards to her transportation through her insurance. The number is [phone] and she would like a callback

## 2021-09-13 NOTE — Telephone Encounter (Signed)
Pt states she is trying to go to the hospital for rehab & she states that she needs EMS to take her due to being bedridden. She states insurance needs pre-cert form submitted to them for the request & mark to have it expedited. States this form needs to be completed electronically. Pt states the form needs to be submitted & approved by insurance before setting the date. Number provided by pt is the number to call to get this approved via phone.

## 2021-09-14 NOTE — Telephone Encounter (Signed)
Form has been completed and faxed to number, [phone]), provided by pt.

## 2021-09-17 ENCOUNTER — Telehealth: Payer: Self-pay

## 2021-09-17 NOTE — Telephone Encounter (Signed)
Patient called to check the status of her paperwork and was informed that the paperwork was faxed on 9/23.

## 2021-09-25 ENCOUNTER — Other Ambulatory Visit: Payer: Self-pay | Admitting: Family Medicine

## 2021-09-26 ENCOUNTER — Telehealth: Payer: Self-pay

## 2021-09-26 NOTE — Telephone Encounter (Signed)
[employer] called and stated that they have not received the pre authorization form for non medical ambulance transportation to hospital . The form could be faxed to [phone].  If anyone have any questions they could contact the providers line which is [phone].  Please advise

## 2021-09-26 NOTE — Telephone Encounter (Signed)
Jasmine from [place] called in regards to patient getting Prior authorization for transportation in an ambulance to a Dr,s appt to [facility]. Call back # [phone] Prior authorization # [id number]

## 2021-09-27 NOTE — Telephone Encounter (Signed)
Form was faxed to [phone] on 10/05, was previously faxed to number that was provided by pt.

## 2021-09-27 NOTE — Telephone Encounter (Signed)
Form was faxed to [phone] on 10/05, was previously faxed to number that was provided by pt.

## 2021-09-28 NOTE — Telephone Encounter (Signed)
Form faxed to [phone], confirmation rec'd.

## 2021-09-28 NOTE — Telephone Encounter (Signed)
[employer] called back to check the status of PA and was informed forms had been faxed to # given by patient. Jasmine asked if they can be faxed to [phone]

## 2021-10-01 ENCOUNTER — Telehealth: Payer: Medicare HMO | Admitting: Family Medicine

## 2021-10-02 ENCOUNTER — Other Ambulatory Visit: Payer: Self-pay

## 2021-10-02 ENCOUNTER — Telehealth: Payer: Self-pay | Admitting: Family Medicine

## 2021-10-02 DIAGNOSIS — I10 Essential (primary) hypertension: Secondary | ICD-10-CM

## 2021-10-02 DIAGNOSIS — L8990 Pressure ulcer of unspecified site, unspecified stage: Secondary | ICD-10-CM

## 2021-10-02 DIAGNOSIS — Z7409 Other reduced mobility: Secondary | ICD-10-CM

## 2021-10-02 DIAGNOSIS — G959 Disease of spinal cord, unspecified: Secondary | ICD-10-CM

## 2021-10-02 NOTE — Telephone Encounter (Signed)
Jasmine from [place] called back and requested a separate Prior Authorization for ambulance transportation for patient Dr's appts.  fax # [phone]

## 2021-10-02 NOTE — Telephone Encounter (Signed)
Spoke with pt, informed her that I had just spoken with Jazmine from [place] and was informed of places that are able to do the home health. Placed a referral to go to [facility], she was asking about doing inpatient, informed her that [facility] will schedule with her and once they evaluate, they will determine if inpatient is needed.

## 2021-10-02 NOTE — Telephone Encounter (Signed)
Spoke with [place] from [place], she informed me that this Prior Josem Kaufmann is not needed for what the pt and PCP are requesting. Lattie Haw stated she would send a referral to pt case manager and RN to [facility] for patient and PT/OT in pt home. Stated it may take about a week to do so.

## 2021-10-02 NOTE — Telephone Encounter (Signed)
Pt call and want a call back about her physical therapy.

## 2021-10-03 ENCOUNTER — Telehealth: Payer: Self-pay | Admitting: Family Medicine

## 2021-10-03 MED ORDER — LACTULOSE 10 GM/15ML PO SOLN: ORAL | 1 refills | Status: DC

## 2021-10-03 NOTE — Telephone Encounter (Signed)
Refill sent.

## 2021-10-03 NOTE — Telephone Encounter (Signed)
Pt call and stated she need a new RX for lactulose ([place]) 10 GM/15ML solution sent to  [id number] - [place], [place] - [place] Phone:  [phone]  Fax:  [phone]

## 2021-10-05 DIAGNOSIS — I10 Essential (primary) hypertension: Secondary | ICD-10-CM | POA: Diagnosis not present

## 2021-10-05 DIAGNOSIS — K592 Neurogenic bowel, not elsewhere classified: Secondary | ICD-10-CM | POA: Diagnosis not present

## 2021-10-05 DIAGNOSIS — M5104 Intervertebral disc disorders with myelopathy, thoracic region: Secondary | ICD-10-CM | POA: Diagnosis not present

## 2021-10-05 DIAGNOSIS — M5416 Radiculopathy, lumbar region: Secondary | ICD-10-CM | POA: Diagnosis not present

## 2021-10-05 DIAGNOSIS — G8220 Paraplegia, unspecified: Secondary | ICD-10-CM | POA: Diagnosis not present

## 2021-10-05 DIAGNOSIS — M4714 Other spondylosis with myelopathy, thoracic region: Secondary | ICD-10-CM | POA: Diagnosis not present

## 2021-10-05 DIAGNOSIS — N319 Neuromuscular dysfunction of bladder, unspecified: Secondary | ICD-10-CM | POA: Diagnosis not present

## 2021-10-05 DIAGNOSIS — L89892 Pressure ulcer of other site, stage 2: Secondary | ICD-10-CM | POA: Diagnosis not present

## 2021-10-05 DIAGNOSIS — J45909 Unspecified asthma, uncomplicated: Secondary | ICD-10-CM | POA: Diagnosis not present

## 2021-10-05 DIAGNOSIS — L89322 Pressure ulcer of left buttock, stage 2: Secondary | ICD-10-CM | POA: Diagnosis not present

## 2021-10-05 DIAGNOSIS — D638 Anemia in other chronic diseases classified elsewhere: Secondary | ICD-10-CM | POA: Diagnosis not present

## 2021-10-05 DIAGNOSIS — R69 Illness, unspecified: Secondary | ICD-10-CM | POA: Diagnosis not present

## 2021-10-08 ENCOUNTER — Telehealth: Payer: Self-pay

## 2021-10-08 NOTE — Telephone Encounter (Signed)
Jari Sportsman from [facility] called asking for verbal orders for wound care stage 2 under left breast DuoDerm and 1/2 lacerations under breast  PRISMA Call back [phone] secure can leave voicemail

## 2021-10-09 DIAGNOSIS — N319 Neuromuscular dysfunction of bladder, unspecified: Secondary | ICD-10-CM | POA: Diagnosis not present

## 2021-10-09 DIAGNOSIS — R69 Illness, unspecified: Secondary | ICD-10-CM | POA: Diagnosis not present

## 2021-10-09 DIAGNOSIS — M5104 Intervertebral disc disorders with myelopathy, thoracic region: Secondary | ICD-10-CM | POA: Diagnosis not present

## 2021-10-09 DIAGNOSIS — L89892 Pressure ulcer of other site, stage 2: Secondary | ICD-10-CM | POA: Diagnosis not present

## 2021-10-09 DIAGNOSIS — D638 Anemia in other chronic diseases classified elsewhere: Secondary | ICD-10-CM | POA: Diagnosis not present

## 2021-10-09 DIAGNOSIS — R32 Unspecified urinary incontinence: Secondary | ICD-10-CM | POA: Diagnosis not present

## 2021-10-09 DIAGNOSIS — G959 Disease of spinal cord, unspecified: Secondary | ICD-10-CM | POA: Diagnosis not present

## 2021-10-09 DIAGNOSIS — I10 Essential (primary) hypertension: Secondary | ICD-10-CM | POA: Diagnosis not present

## 2021-10-09 DIAGNOSIS — M5416 Radiculopathy, lumbar region: Secondary | ICD-10-CM | POA: Diagnosis not present

## 2021-10-09 DIAGNOSIS — M4714 Other spondylosis with myelopathy, thoracic region: Secondary | ICD-10-CM | POA: Diagnosis not present

## 2021-10-09 DIAGNOSIS — G8220 Paraplegia, unspecified: Secondary | ICD-10-CM | POA: Diagnosis not present

## 2021-10-09 DIAGNOSIS — J45909 Unspecified asthma, uncomplicated: Secondary | ICD-10-CM | POA: Diagnosis not present

## 2021-10-09 DIAGNOSIS — L89322 Pressure ulcer of left buttock, stage 2: Secondary | ICD-10-CM | POA: Diagnosis not present

## 2021-10-09 NOTE — Telephone Encounter (Signed)
Spoke with Lottie, gave the VO from Dr Volanda Napoleon for the wound care.

## 2021-10-11 ENCOUNTER — Telehealth: Payer: Self-pay | Admitting: Family Medicine

## 2021-10-11 ENCOUNTER — Telehealth: Payer: Self-pay

## 2021-10-11 NOTE — Telephone Encounter (Signed)
Ellen Hill from [facility] called and wanted to speak about matters pertaining to whether the patient will be placed into inpatient rehabilitation and some other matters.     Good callback number is [phone]     Please Advise

## 2021-10-11 NOTE — Telephone Encounter (Signed)
Merrie Roof called to follow up on an earlier phone call. I let him know that they were out of the office right now and would give a call back when available.    Please Advise

## 2021-10-11 NOTE — Telephone Encounter (Signed)
Error

## 2021-10-11 NOTE — Telephone Encounter (Signed)
Merrie Roof from [facility] called wanting an update on patient's referral for inpatient rehab, Merrie Roof was informed that the referral is actively being worked on and to continue therapy services until patient can be placed.

## 2021-10-12 DIAGNOSIS — R69 Illness, unspecified: Secondary | ICD-10-CM | POA: Diagnosis not present

## 2021-10-12 DIAGNOSIS — L89322 Pressure ulcer of left buttock, stage 2: Secondary | ICD-10-CM | POA: Diagnosis not present

## 2021-10-12 DIAGNOSIS — I10 Essential (primary) hypertension: Secondary | ICD-10-CM | POA: Diagnosis not present

## 2021-10-12 DIAGNOSIS — J45909 Unspecified asthma, uncomplicated: Secondary | ICD-10-CM | POA: Diagnosis not present

## 2021-10-12 DIAGNOSIS — L89892 Pressure ulcer of other site, stage 2: Secondary | ICD-10-CM | POA: Diagnosis not present

## 2021-10-12 DIAGNOSIS — M5416 Radiculopathy, lumbar region: Secondary | ICD-10-CM | POA: Diagnosis not present

## 2021-10-12 DIAGNOSIS — G8220 Paraplegia, unspecified: Secondary | ICD-10-CM | POA: Diagnosis not present

## 2021-10-12 DIAGNOSIS — N319 Neuromuscular dysfunction of bladder, unspecified: Secondary | ICD-10-CM | POA: Diagnosis not present

## 2021-10-12 DIAGNOSIS — D638 Anemia in other chronic diseases classified elsewhere: Secondary | ICD-10-CM | POA: Diagnosis not present

## 2021-10-12 DIAGNOSIS — M5104 Intervertebral disc disorders with myelopathy, thoracic region: Secondary | ICD-10-CM | POA: Diagnosis not present

## 2021-10-16 ENCOUNTER — Telehealth: Payer: Self-pay

## 2021-10-16 DIAGNOSIS — R69 Illness, unspecified: Secondary | ICD-10-CM | POA: Diagnosis not present

## 2021-10-16 DIAGNOSIS — L89322 Pressure ulcer of left buttock, stage 2: Secondary | ICD-10-CM | POA: Diagnosis not present

## 2021-10-16 DIAGNOSIS — D638 Anemia in other chronic diseases classified elsewhere: Secondary | ICD-10-CM | POA: Diagnosis not present

## 2021-10-16 DIAGNOSIS — I10 Essential (primary) hypertension: Secondary | ICD-10-CM | POA: Diagnosis not present

## 2021-10-16 DIAGNOSIS — G8220 Paraplegia, unspecified: Secondary | ICD-10-CM | POA: Diagnosis not present

## 2021-10-16 DIAGNOSIS — M5416 Radiculopathy, lumbar region: Secondary | ICD-10-CM | POA: Diagnosis not present

## 2021-10-16 DIAGNOSIS — M5104 Intervertebral disc disorders with myelopathy, thoracic region: Secondary | ICD-10-CM | POA: Diagnosis not present

## 2021-10-16 DIAGNOSIS — N319 Neuromuscular dysfunction of bladder, unspecified: Secondary | ICD-10-CM | POA: Diagnosis not present

## 2021-10-16 DIAGNOSIS — L89892 Pressure ulcer of other site, stage 2: Secondary | ICD-10-CM | POA: Diagnosis not present

## 2021-10-16 DIAGNOSIS — J45909 Unspecified asthma, uncomplicated: Secondary | ICD-10-CM | POA: Diagnosis not present

## 2021-10-16 NOTE — Telephone Encounter (Signed)
Patient called asking that paperwork for services can be faxed asap so that services will continue, pt was informed PCP is out of the office and a message would be sent.

## 2021-10-17 DIAGNOSIS — L89322 Pressure ulcer of left buttock, stage 2: Secondary | ICD-10-CM | POA: Diagnosis not present

## 2021-10-17 DIAGNOSIS — R69 Illness, unspecified: Secondary | ICD-10-CM | POA: Diagnosis not present

## 2021-10-17 DIAGNOSIS — I10 Essential (primary) hypertension: Secondary | ICD-10-CM | POA: Diagnosis not present

## 2021-10-17 DIAGNOSIS — G8220 Paraplegia, unspecified: Secondary | ICD-10-CM | POA: Diagnosis not present

## 2021-10-17 DIAGNOSIS — D638 Anemia in other chronic diseases classified elsewhere: Secondary | ICD-10-CM | POA: Diagnosis not present

## 2021-10-17 DIAGNOSIS — L89892 Pressure ulcer of other site, stage 2: Secondary | ICD-10-CM | POA: Diagnosis not present

## 2021-10-17 DIAGNOSIS — M5416 Radiculopathy, lumbar region: Secondary | ICD-10-CM | POA: Diagnosis not present

## 2021-10-17 DIAGNOSIS — M5104 Intervertebral disc disorders with myelopathy, thoracic region: Secondary | ICD-10-CM | POA: Diagnosis not present

## 2021-10-17 DIAGNOSIS — N319 Neuromuscular dysfunction of bladder, unspecified: Secondary | ICD-10-CM | POA: Diagnosis not present

## 2021-10-17 DIAGNOSIS — J45909 Unspecified asthma, uncomplicated: Secondary | ICD-10-CM | POA: Diagnosis not present

## 2021-10-17 NOTE — Telephone Encounter (Signed)
Rec'd some paperwork in folder, placed on providers desk.

## 2021-10-18 ENCOUNTER — Telehealth: Payer: Self-pay | Admitting: Family Medicine

## 2021-10-18 DIAGNOSIS — M5416 Radiculopathy, lumbar region: Secondary | ICD-10-CM

## 2021-10-18 DIAGNOSIS — J45909 Unspecified asthma, uncomplicated: Secondary | ICD-10-CM | POA: Diagnosis not present

## 2021-10-18 DIAGNOSIS — M5104 Intervertebral disc disorders with myelopathy, thoracic region: Secondary | ICD-10-CM | POA: Diagnosis not present

## 2021-10-18 DIAGNOSIS — G8220 Paraplegia, unspecified: Secondary | ICD-10-CM | POA: Diagnosis not present

## 2021-10-18 DIAGNOSIS — L89892 Pressure ulcer of other site, stage 2: Secondary | ICD-10-CM | POA: Diagnosis not present

## 2021-10-18 DIAGNOSIS — R69 Illness, unspecified: Secondary | ICD-10-CM | POA: Diagnosis not present

## 2021-10-18 DIAGNOSIS — N319 Neuromuscular dysfunction of bladder, unspecified: Secondary | ICD-10-CM | POA: Diagnosis not present

## 2021-10-18 DIAGNOSIS — R262 Difficulty in walking, not elsewhere classified: Secondary | ICD-10-CM

## 2021-10-18 DIAGNOSIS — D638 Anemia in other chronic diseases classified elsewhere: Secondary | ICD-10-CM | POA: Diagnosis not present

## 2021-10-18 DIAGNOSIS — I10 Essential (primary) hypertension: Secondary | ICD-10-CM | POA: Diagnosis not present

## 2021-10-18 DIAGNOSIS — L89322 Pressure ulcer of left buttock, stage 2: Secondary | ICD-10-CM | POA: Diagnosis not present

## 2021-10-18 DIAGNOSIS — G959 Disease of spinal cord, unspecified: Secondary | ICD-10-CM

## 2021-10-18 NOTE — Telephone Encounter (Signed)
Sharee Pimple with [facility] is calling and needs an order for the pt to have bariatric sliding board please fax order to DME place that is in pt network

## 2021-10-19 ENCOUNTER — Telehealth: Payer: Self-pay | Admitting: Family Medicine

## 2021-10-19 DIAGNOSIS — G8220 Paraplegia, unspecified: Secondary | ICD-10-CM | POA: Diagnosis not present

## 2021-10-19 DIAGNOSIS — R69 Illness, unspecified: Secondary | ICD-10-CM | POA: Diagnosis not present

## 2021-10-19 DIAGNOSIS — M5416 Radiculopathy, lumbar region: Secondary | ICD-10-CM | POA: Diagnosis not present

## 2021-10-19 DIAGNOSIS — D638 Anemia in other chronic diseases classified elsewhere: Secondary | ICD-10-CM | POA: Diagnosis not present

## 2021-10-19 DIAGNOSIS — M5104 Intervertebral disc disorders with myelopathy, thoracic region: Secondary | ICD-10-CM | POA: Diagnosis not present

## 2021-10-19 DIAGNOSIS — L89322 Pressure ulcer of left buttock, stage 2: Secondary | ICD-10-CM | POA: Diagnosis not present

## 2021-10-19 DIAGNOSIS — L89892 Pressure ulcer of other site, stage 2: Secondary | ICD-10-CM | POA: Diagnosis not present

## 2021-10-19 DIAGNOSIS — J45909 Unspecified asthma, uncomplicated: Secondary | ICD-10-CM | POA: Diagnosis not present

## 2021-10-19 DIAGNOSIS — I10 Essential (primary) hypertension: Secondary | ICD-10-CM | POA: Diagnosis not present

## 2021-10-19 DIAGNOSIS — N319 Neuromuscular dysfunction of bladder, unspecified: Secondary | ICD-10-CM | POA: Diagnosis not present

## 2021-10-19 NOTE — Telephone Encounter (Signed)
DME placed for sliding board

## 2021-10-19 NOTE — Telephone Encounter (Signed)
Form completed, faxed, confirmed, and patient is aware.

## 2021-10-19 NOTE — Telephone Encounter (Signed)
Forms need:   Medicaid # [id number] Q Step 2 - patient's address and phone number Date of last office visit - step 5

## 2021-10-19 NOTE — Telephone Encounter (Signed)
Patient called regarding paperwork that was faxed with incorrect information. Patient would like a callback to discuss.     Good callback number is [phone]    Please advise

## 2021-10-22 ENCOUNTER — Telehealth: Payer: Self-pay | Admitting: Family Medicine

## 2021-10-22 DIAGNOSIS — D638 Anemia in other chronic diseases classified elsewhere: Secondary | ICD-10-CM | POA: Diagnosis not present

## 2021-10-22 DIAGNOSIS — M5416 Radiculopathy, lumbar region: Secondary | ICD-10-CM | POA: Diagnosis not present

## 2021-10-22 DIAGNOSIS — L89892 Pressure ulcer of other site, stage 2: Secondary | ICD-10-CM | POA: Diagnosis not present

## 2021-10-22 DIAGNOSIS — G8220 Paraplegia, unspecified: Secondary | ICD-10-CM | POA: Diagnosis not present

## 2021-10-22 DIAGNOSIS — N319 Neuromuscular dysfunction of bladder, unspecified: Secondary | ICD-10-CM | POA: Diagnosis not present

## 2021-10-22 DIAGNOSIS — I10 Essential (primary) hypertension: Secondary | ICD-10-CM | POA: Diagnosis not present

## 2021-10-22 DIAGNOSIS — J45909 Unspecified asthma, uncomplicated: Secondary | ICD-10-CM | POA: Diagnosis not present

## 2021-10-22 DIAGNOSIS — R69 Illness, unspecified: Secondary | ICD-10-CM | POA: Diagnosis not present

## 2021-10-22 DIAGNOSIS — L89322 Pressure ulcer of left buttock, stage 2: Secondary | ICD-10-CM | POA: Diagnosis not present

## 2021-10-22 DIAGNOSIS — M5104 Intervertebral disc disorders with myelopathy, thoracic region: Secondary | ICD-10-CM | POA: Diagnosis not present

## 2021-10-22 NOTE — Telephone Encounter (Signed)
Ellen Hill from [facility] is calling for a new order for medihoney, aquacel ag, foam dressing for wounds under breast. Will be changed twice a week    Good callback number is [phone]     Please advise

## 2021-10-24 DIAGNOSIS — R69 Illness, unspecified: Secondary | ICD-10-CM | POA: Diagnosis not present

## 2021-10-24 DIAGNOSIS — L89892 Pressure ulcer of other site, stage 2: Secondary | ICD-10-CM | POA: Diagnosis not present

## 2021-10-24 DIAGNOSIS — L89322 Pressure ulcer of left buttock, stage 2: Secondary | ICD-10-CM | POA: Diagnosis not present

## 2021-10-24 DIAGNOSIS — N319 Neuromuscular dysfunction of bladder, unspecified: Secondary | ICD-10-CM | POA: Diagnosis not present

## 2021-10-24 DIAGNOSIS — M5416 Radiculopathy, lumbar region: Secondary | ICD-10-CM | POA: Diagnosis not present

## 2021-10-24 DIAGNOSIS — J45909 Unspecified asthma, uncomplicated: Secondary | ICD-10-CM | POA: Diagnosis not present

## 2021-10-24 DIAGNOSIS — D638 Anemia in other chronic diseases classified elsewhere: Secondary | ICD-10-CM | POA: Diagnosis not present

## 2021-10-24 DIAGNOSIS — M5104 Intervertebral disc disorders with myelopathy, thoracic region: Secondary | ICD-10-CM | POA: Diagnosis not present

## 2021-10-24 DIAGNOSIS — I10 Essential (primary) hypertension: Secondary | ICD-10-CM | POA: Diagnosis not present

## 2021-10-24 DIAGNOSIS — G8220 Paraplegia, unspecified: Secondary | ICD-10-CM | POA: Diagnosis not present

## 2021-10-24 NOTE — Telephone Encounter (Signed)
Ok

## 2021-10-24 NOTE — Telephone Encounter (Signed)
Spoke with Elmyra Ricks, gave the verbal orders for patient.

## 2021-10-25 DIAGNOSIS — L89892 Pressure ulcer of other site, stage 2: Secondary | ICD-10-CM | POA: Diagnosis not present

## 2021-10-25 DIAGNOSIS — N319 Neuromuscular dysfunction of bladder, unspecified: Secondary | ICD-10-CM | POA: Diagnosis not present

## 2021-10-25 DIAGNOSIS — I10 Essential (primary) hypertension: Secondary | ICD-10-CM | POA: Diagnosis not present

## 2021-10-25 DIAGNOSIS — L89322 Pressure ulcer of left buttock, stage 2: Secondary | ICD-10-CM | POA: Diagnosis not present

## 2021-10-25 DIAGNOSIS — M5416 Radiculopathy, lumbar region: Secondary | ICD-10-CM | POA: Diagnosis not present

## 2021-10-25 DIAGNOSIS — R69 Illness, unspecified: Secondary | ICD-10-CM | POA: Diagnosis not present

## 2021-10-25 DIAGNOSIS — G8220 Paraplegia, unspecified: Secondary | ICD-10-CM | POA: Diagnosis not present

## 2021-10-25 DIAGNOSIS — D638 Anemia in other chronic diseases classified elsewhere: Secondary | ICD-10-CM | POA: Diagnosis not present

## 2021-10-25 DIAGNOSIS — M5104 Intervertebral disc disorders with myelopathy, thoracic region: Secondary | ICD-10-CM | POA: Diagnosis not present

## 2021-10-25 DIAGNOSIS — J45909 Unspecified asthma, uncomplicated: Secondary | ICD-10-CM | POA: Diagnosis not present

## 2021-10-26 DIAGNOSIS — M5416 Radiculopathy, lumbar region: Secondary | ICD-10-CM | POA: Diagnosis not present

## 2021-10-26 DIAGNOSIS — G8220 Paraplegia, unspecified: Secondary | ICD-10-CM | POA: Diagnosis not present

## 2021-10-26 DIAGNOSIS — R69 Illness, unspecified: Secondary | ICD-10-CM | POA: Diagnosis not present

## 2021-10-26 DIAGNOSIS — I10 Essential (primary) hypertension: Secondary | ICD-10-CM | POA: Diagnosis not present

## 2021-10-26 DIAGNOSIS — D638 Anemia in other chronic diseases classified elsewhere: Secondary | ICD-10-CM | POA: Diagnosis not present

## 2021-10-26 DIAGNOSIS — L89322 Pressure ulcer of left buttock, stage 2: Secondary | ICD-10-CM | POA: Diagnosis not present

## 2021-10-26 DIAGNOSIS — N319 Neuromuscular dysfunction of bladder, unspecified: Secondary | ICD-10-CM | POA: Diagnosis not present

## 2021-10-26 DIAGNOSIS — M5104 Intervertebral disc disorders with myelopathy, thoracic region: Secondary | ICD-10-CM | POA: Diagnosis not present

## 2021-10-26 DIAGNOSIS — L89892 Pressure ulcer of other site, stage 2: Secondary | ICD-10-CM | POA: Diagnosis not present

## 2021-10-26 DIAGNOSIS — J45909 Unspecified asthma, uncomplicated: Secondary | ICD-10-CM | POA: Diagnosis not present

## 2021-10-26 NOTE — Telephone Encounter (Signed)
Paperwork was faxed and fax confirmation rec'd.

## 2021-10-30 DIAGNOSIS — M5104 Intervertebral disc disorders with myelopathy, thoracic region: Secondary | ICD-10-CM | POA: Diagnosis not present

## 2021-10-30 DIAGNOSIS — D638 Anemia in other chronic diseases classified elsewhere: Secondary | ICD-10-CM | POA: Diagnosis not present

## 2021-10-30 DIAGNOSIS — M5416 Radiculopathy, lumbar region: Secondary | ICD-10-CM | POA: Diagnosis not present

## 2021-10-30 DIAGNOSIS — G8220 Paraplegia, unspecified: Secondary | ICD-10-CM | POA: Diagnosis not present

## 2021-10-30 DIAGNOSIS — I10 Essential (primary) hypertension: Secondary | ICD-10-CM | POA: Diagnosis not present

## 2021-10-30 DIAGNOSIS — R69 Illness, unspecified: Secondary | ICD-10-CM | POA: Diagnosis not present

## 2021-10-30 DIAGNOSIS — L89892 Pressure ulcer of other site, stage 2: Secondary | ICD-10-CM | POA: Diagnosis not present

## 2021-10-30 DIAGNOSIS — L89322 Pressure ulcer of left buttock, stage 2: Secondary | ICD-10-CM | POA: Diagnosis not present

## 2021-10-30 DIAGNOSIS — J45909 Unspecified asthma, uncomplicated: Secondary | ICD-10-CM | POA: Diagnosis not present

## 2021-10-30 DIAGNOSIS — N319 Neuromuscular dysfunction of bladder, unspecified: Secondary | ICD-10-CM | POA: Diagnosis not present

## 2021-10-31 DIAGNOSIS — L89892 Pressure ulcer of other site, stage 2: Secondary | ICD-10-CM | POA: Diagnosis not present

## 2021-10-31 DIAGNOSIS — I10 Essential (primary) hypertension: Secondary | ICD-10-CM | POA: Diagnosis not present

## 2021-10-31 DIAGNOSIS — R69 Illness, unspecified: Secondary | ICD-10-CM | POA: Diagnosis not present

## 2021-10-31 DIAGNOSIS — J45909 Unspecified asthma, uncomplicated: Secondary | ICD-10-CM | POA: Diagnosis not present

## 2021-10-31 DIAGNOSIS — G8220 Paraplegia, unspecified: Secondary | ICD-10-CM | POA: Diagnosis not present

## 2021-10-31 DIAGNOSIS — M5104 Intervertebral disc disorders with myelopathy, thoracic region: Secondary | ICD-10-CM | POA: Diagnosis not present

## 2021-10-31 DIAGNOSIS — D638 Anemia in other chronic diseases classified elsewhere: Secondary | ICD-10-CM | POA: Diagnosis not present

## 2021-10-31 DIAGNOSIS — N319 Neuromuscular dysfunction of bladder, unspecified: Secondary | ICD-10-CM | POA: Diagnosis not present

## 2021-10-31 DIAGNOSIS — L89322 Pressure ulcer of left buttock, stage 2: Secondary | ICD-10-CM | POA: Diagnosis not present

## 2021-10-31 DIAGNOSIS — M5416 Radiculopathy, lumbar region: Secondary | ICD-10-CM | POA: Diagnosis not present

## 2021-11-01 ENCOUNTER — Telehealth (INDEPENDENT_AMBULATORY_CARE_PROVIDER_SITE_OTHER): Payer: Medicare HMO | Admitting: Family Medicine

## 2021-11-01 DIAGNOSIS — K592 Neurogenic bowel, not elsewhere classified: Secondary | ICD-10-CM | POA: Diagnosis not present

## 2021-11-01 DIAGNOSIS — M5416 Radiculopathy, lumbar region: Secondary | ICD-10-CM | POA: Diagnosis not present

## 2021-11-01 DIAGNOSIS — G959 Disease of spinal cord, unspecified: Secondary | ICD-10-CM

## 2021-11-01 DIAGNOSIS — R262 Difficulty in walking, not elsewhere classified: Secondary | ICD-10-CM

## 2021-11-01 DIAGNOSIS — K5900 Constipation, unspecified: Secondary | ICD-10-CM

## 2021-11-01 NOTE — Progress Notes (Signed)
Virtual Visit via Telephone Note  I connected with Ellen Hill on 11/01/21 at  4:00 PM EST by telephone and verified that I am speaking with the correct person using two identifiers.   I discussed the limitations, risks, security and privacy concerns of performing an evaluation and management service by telephone and the availability of in person appointments. I also discussed with the patient that there may be a patient responsible charge related to this service. The patient expressed understanding and agreed to proceed.  Location patient: home Location provider: work or home office Participants present for the call: patient, provider Patient did not have a visit in the prior 7 days to address this/these issue(s).  Chief Complaint  Patient presents with   Follow-up     History of Present Illness: Pt is a 50 year old female with pmh sig for HTN, history of lumbar radiculopathy s/p T9-L1 decompressive laminectomy, myelopathy, neurogenic bowel, immobility, pressure ulcers, morbid obesity, physical deconditioning, migraines, and asthma seen for follow-up.  Pt states she has been doing good.  Physical therapy is going well.  She also has a nurse that comes in twice a wk to do changes dressings on bed sores.  Pt states she was advised to ask about pain medication when working with physical therapy and she is really sore after sessions.  Patient states OTC medications are not helping.  Patient inquires about DME requested by PT including gel overlay, bariatric mattress, and a bariatric sliding board.  The order for the bariatric sliding board was previously placed as this was the only item requested patient informed be pt states lactulose twice daily no longer working for constipation.  Using prune juice and lactulose together.    Patient still working on transportation options to get her to appointments.  She has not seen her neurologist or her surgeon in the last few years 2/2 impaired  mobility.  Forms previously completed for insurance company later advised where not needed.     Observations/Objective: Patient sounds cheerful and well on the phone. I do not appreciate any SOB. Speech and thought processing are grossly intact. Patient reported vitals:  Assessment and Plan: Morbid obesity ([place]) - Plan: For home use only DME Gel overlay, Mattress bariatric  Lumbar back pain with radiculopathy affecting lower extremity  Myelopathy (HCC)  Impaired ambulation  Constipation due to neurogenic bowel  Pt advised that her degree of constipation is likely multifactorial given her history of myelopathy and decreased mobility.  Patient advised on the importance of having labs drawn given daily use of lactulose.  Also advised on the need for in person visit as patient has not been seen in person since an ED visit on 08/04/2019.  Discussed arranging transport with a transportation company given current mobility limitations.  Patient also advised to contact insurance company regarding options.  Given patient's constipation, patient advised prescription pain medications such as opioids would likely worsen current symptoms.  Discussed prioritizing follow-up visits.  Advised labs could be drawn during specialist visit with neurologist as this is likely the most important follow-up.  Patient expressed understanding.  Orders for DME placed including bariatric mattress and gel overlay.  Patient advised bariatric sliding board order previously placed.   Follow Up Instructions: As needed   99441 5-10 99442 11-20 9443 21-30 I did not refer this patient for an OV in the next 24 hours for this/these issue(s).  I discussed the assessment and treatment plan with the patient. The patient was provided an opportunity to ask  questions and all were answered. The patient agreed with the plan and demonstrated an understanding of the instructions.   The patient was advised to call back or seek an  in-person evaluation if the symptoms worsen or if the condition fails to improve as anticipated.  I provided 28 minutes of non-face-to-face time during this encounter.   Ellen Ruddy, MD

## 2021-11-02 ENCOUNTER — Telehealth: Payer: Self-pay | Admitting: Family Medicine

## 2021-11-02 DIAGNOSIS — G8220 Paraplegia, unspecified: Secondary | ICD-10-CM | POA: Diagnosis not present

## 2021-11-02 DIAGNOSIS — D638 Anemia in other chronic diseases classified elsewhere: Secondary | ICD-10-CM | POA: Diagnosis not present

## 2021-11-02 DIAGNOSIS — L89892 Pressure ulcer of other site, stage 2: Secondary | ICD-10-CM | POA: Diagnosis not present

## 2021-11-02 DIAGNOSIS — N319 Neuromuscular dysfunction of bladder, unspecified: Secondary | ICD-10-CM | POA: Diagnosis not present

## 2021-11-02 DIAGNOSIS — M5104 Intervertebral disc disorders with myelopathy, thoracic region: Secondary | ICD-10-CM | POA: Diagnosis not present

## 2021-11-02 DIAGNOSIS — J45909 Unspecified asthma, uncomplicated: Secondary | ICD-10-CM | POA: Diagnosis not present

## 2021-11-02 DIAGNOSIS — L89322 Pressure ulcer of left buttock, stage 2: Secondary | ICD-10-CM | POA: Diagnosis not present

## 2021-11-02 DIAGNOSIS — R69 Illness, unspecified: Secondary | ICD-10-CM | POA: Diagnosis not present

## 2021-11-02 DIAGNOSIS — M5416 Radiculopathy, lumbar region: Secondary | ICD-10-CM | POA: Diagnosis not present

## 2021-11-02 DIAGNOSIS — I10 Essential (primary) hypertension: Secondary | ICD-10-CM | POA: Diagnosis not present

## 2021-11-02 NOTE — Telephone Encounter (Signed)
Elmyra Ricks from [facility] called to get orders to do lab work in home so patient could get her prescription, but patient did not specify which prescription, only stated that Dr.Banks would not fill it because she has not had labs in two years.       Good callback number is [phone] Faythe Ghee to leave voicemail

## 2021-11-05 DIAGNOSIS — D638 Anemia in other chronic diseases classified elsewhere: Secondary | ICD-10-CM | POA: Diagnosis not present

## 2021-11-05 DIAGNOSIS — G8220 Paraplegia, unspecified: Secondary | ICD-10-CM | POA: Diagnosis not present

## 2021-11-05 DIAGNOSIS — J45909 Unspecified asthma, uncomplicated: Secondary | ICD-10-CM | POA: Diagnosis not present

## 2021-11-05 DIAGNOSIS — N319 Neuromuscular dysfunction of bladder, unspecified: Secondary | ICD-10-CM | POA: Diagnosis not present

## 2021-11-05 DIAGNOSIS — M5104 Intervertebral disc disorders with myelopathy, thoracic region: Secondary | ICD-10-CM | POA: Diagnosis not present

## 2021-11-05 DIAGNOSIS — L89892 Pressure ulcer of other site, stage 2: Secondary | ICD-10-CM | POA: Diagnosis not present

## 2021-11-05 DIAGNOSIS — L89322 Pressure ulcer of left buttock, stage 2: Secondary | ICD-10-CM | POA: Diagnosis not present

## 2021-11-05 DIAGNOSIS — M5416 Radiculopathy, lumbar region: Secondary | ICD-10-CM | POA: Diagnosis not present

## 2021-11-05 DIAGNOSIS — I10 Essential (primary) hypertension: Secondary | ICD-10-CM | POA: Diagnosis not present

## 2021-11-05 DIAGNOSIS — R69 Illness, unspecified: Secondary | ICD-10-CM | POA: Diagnosis not present

## 2021-11-06 ENCOUNTER — Telehealth: Payer: Self-pay | Admitting: Family Medicine

## 2021-11-06 DIAGNOSIS — R69 Illness, unspecified: Secondary | ICD-10-CM | POA: Diagnosis not present

## 2021-11-06 DIAGNOSIS — D638 Anemia in other chronic diseases classified elsewhere: Secondary | ICD-10-CM | POA: Diagnosis not present

## 2021-11-06 DIAGNOSIS — M5104 Intervertebral disc disorders with myelopathy, thoracic region: Secondary | ICD-10-CM | POA: Diagnosis not present

## 2021-11-06 DIAGNOSIS — N319 Neuromuscular dysfunction of bladder, unspecified: Secondary | ICD-10-CM | POA: Diagnosis not present

## 2021-11-06 DIAGNOSIS — I10 Essential (primary) hypertension: Secondary | ICD-10-CM | POA: Diagnosis not present

## 2021-11-06 DIAGNOSIS — L89322 Pressure ulcer of left buttock, stage 2: Secondary | ICD-10-CM | POA: Diagnosis not present

## 2021-11-06 DIAGNOSIS — J45909 Unspecified asthma, uncomplicated: Secondary | ICD-10-CM | POA: Diagnosis not present

## 2021-11-06 DIAGNOSIS — M5416 Radiculopathy, lumbar region: Secondary | ICD-10-CM | POA: Diagnosis not present

## 2021-11-06 DIAGNOSIS — G8220 Paraplegia, unspecified: Secondary | ICD-10-CM | POA: Diagnosis not present

## 2021-11-06 DIAGNOSIS — L89892 Pressure ulcer of other site, stage 2: Secondary | ICD-10-CM | POA: Diagnosis not present

## 2021-11-06 NOTE — Telephone Encounter (Signed)
Ellen Hill called to follow up on prescriptions being sent for Bariatric sliding board, mattress and gel overlay to [facility]. Per Madaline Guthrie everything was sent but the mattress was on backorder.   Ellen Hill wanted to give a possible fax number to refax paperwork but will call back to follow up with number.    Possible number is [phone]     Please advise

## 2021-11-07 ENCOUNTER — Other Ambulatory Visit: Payer: Self-pay | Admitting: Family Medicine

## 2021-11-07 DIAGNOSIS — I10 Essential (primary) hypertension: Secondary | ICD-10-CM

## 2021-11-08 ENCOUNTER — Telehealth: Payer: Self-pay | Admitting: Family Medicine

## 2021-11-08 DIAGNOSIS — L89322 Pressure ulcer of left buttock, stage 2: Secondary | ICD-10-CM | POA: Diagnosis not present

## 2021-11-08 DIAGNOSIS — L89892 Pressure ulcer of other site, stage 2: Secondary | ICD-10-CM | POA: Diagnosis not present

## 2021-11-08 DIAGNOSIS — M4714 Other spondylosis with myelopathy, thoracic region: Secondary | ICD-10-CM | POA: Diagnosis not present

## 2021-11-08 DIAGNOSIS — M5416 Radiculopathy, lumbar region: Secondary | ICD-10-CM | POA: Diagnosis not present

## 2021-11-08 DIAGNOSIS — N319 Neuromuscular dysfunction of bladder, unspecified: Secondary | ICD-10-CM | POA: Diagnosis not present

## 2021-11-08 DIAGNOSIS — M5104 Intervertebral disc disorders with myelopathy, thoracic region: Secondary | ICD-10-CM | POA: Diagnosis not present

## 2021-11-08 DIAGNOSIS — I10 Essential (primary) hypertension: Secondary | ICD-10-CM | POA: Diagnosis not present

## 2021-11-08 DIAGNOSIS — R32 Unspecified urinary incontinence: Secondary | ICD-10-CM | POA: Diagnosis not present

## 2021-11-08 DIAGNOSIS — G8220 Paraplegia, unspecified: Secondary | ICD-10-CM | POA: Diagnosis not present

## 2021-11-08 DIAGNOSIS — R69 Illness, unspecified: Secondary | ICD-10-CM | POA: Diagnosis not present

## 2021-11-08 DIAGNOSIS — D638 Anemia in other chronic diseases classified elsewhere: Secondary | ICD-10-CM | POA: Diagnosis not present

## 2021-11-08 DIAGNOSIS — G959 Disease of spinal cord, unspecified: Secondary | ICD-10-CM | POA: Diagnosis not present

## 2021-11-08 DIAGNOSIS — J45909 Unspecified asthma, uncomplicated: Secondary | ICD-10-CM | POA: Diagnosis not present

## 2021-11-08 NOTE — Telephone Encounter (Signed)
Elmyra Ricks from [facility] states patient is needing a refill, however was told she had to get labs first.  Patient is homebound so Home health needs verbal orders to do needed labs.  Elmyra Ricks states she called last week but never heard anything back.

## 2021-11-09 DIAGNOSIS — I10 Essential (primary) hypertension: Secondary | ICD-10-CM | POA: Diagnosis not present

## 2021-11-09 DIAGNOSIS — J45909 Unspecified asthma, uncomplicated: Secondary | ICD-10-CM | POA: Diagnosis not present

## 2021-11-09 DIAGNOSIS — R69 Illness, unspecified: Secondary | ICD-10-CM | POA: Diagnosis not present

## 2021-11-09 DIAGNOSIS — M5416 Radiculopathy, lumbar region: Secondary | ICD-10-CM | POA: Diagnosis not present

## 2021-11-09 DIAGNOSIS — L89892 Pressure ulcer of other site, stage 2: Secondary | ICD-10-CM | POA: Diagnosis not present

## 2021-11-09 DIAGNOSIS — M5104 Intervertebral disc disorders with myelopathy, thoracic region: Secondary | ICD-10-CM | POA: Diagnosis not present

## 2021-11-09 DIAGNOSIS — G8220 Paraplegia, unspecified: Secondary | ICD-10-CM | POA: Diagnosis not present

## 2021-11-09 DIAGNOSIS — N319 Neuromuscular dysfunction of bladder, unspecified: Secondary | ICD-10-CM | POA: Diagnosis not present

## 2021-11-09 DIAGNOSIS — D638 Anemia in other chronic diseases classified elsewhere: Secondary | ICD-10-CM | POA: Diagnosis not present

## 2021-11-09 DIAGNOSIS — L89322 Pressure ulcer of left buttock, stage 2: Secondary | ICD-10-CM | POA: Diagnosis not present

## 2021-11-12 DIAGNOSIS — D638 Anemia in other chronic diseases classified elsewhere: Secondary | ICD-10-CM | POA: Diagnosis not present

## 2021-11-12 DIAGNOSIS — M5416 Radiculopathy, lumbar region: Secondary | ICD-10-CM | POA: Diagnosis not present

## 2021-11-12 DIAGNOSIS — G8220 Paraplegia, unspecified: Secondary | ICD-10-CM | POA: Diagnosis not present

## 2021-11-12 DIAGNOSIS — R69 Illness, unspecified: Secondary | ICD-10-CM | POA: Diagnosis not present

## 2021-11-12 DIAGNOSIS — M5104 Intervertebral disc disorders with myelopathy, thoracic region: Secondary | ICD-10-CM | POA: Diagnosis not present

## 2021-11-12 DIAGNOSIS — N319 Neuromuscular dysfunction of bladder, unspecified: Secondary | ICD-10-CM | POA: Diagnosis not present

## 2021-11-12 DIAGNOSIS — I10 Essential (primary) hypertension: Secondary | ICD-10-CM | POA: Diagnosis not present

## 2021-11-12 DIAGNOSIS — L89892 Pressure ulcer of other site, stage 2: Secondary | ICD-10-CM | POA: Diagnosis not present

## 2021-11-12 DIAGNOSIS — L89322 Pressure ulcer of left buttock, stage 2: Secondary | ICD-10-CM | POA: Diagnosis not present

## 2021-11-12 DIAGNOSIS — J45909 Unspecified asthma, uncomplicated: Secondary | ICD-10-CM | POA: Diagnosis not present

## 2021-11-12 NOTE — Telephone Encounter (Signed)
Needs lab work and to be seen in person at this provider or specialist.

## 2021-11-14 ENCOUNTER — Telehealth: Payer: Self-pay

## 2021-11-14 ENCOUNTER — Telehealth: Payer: Medicare HMO | Admitting: Family Medicine

## 2021-11-14 ENCOUNTER — Encounter: Payer: Self-pay | Admitting: Family Medicine

## 2021-11-14 DIAGNOSIS — N319 Neuromuscular dysfunction of bladder, unspecified: Secondary | ICD-10-CM | POA: Diagnosis not present

## 2021-11-14 DIAGNOSIS — M5416 Radiculopathy, lumbar region: Secondary | ICD-10-CM | POA: Diagnosis not present

## 2021-11-14 DIAGNOSIS — L89892 Pressure ulcer of other site, stage 2: Secondary | ICD-10-CM | POA: Diagnosis not present

## 2021-11-14 DIAGNOSIS — D638 Anemia in other chronic diseases classified elsewhere: Secondary | ICD-10-CM | POA: Diagnosis not present

## 2021-11-14 DIAGNOSIS — L89322 Pressure ulcer of left buttock, stage 2: Secondary | ICD-10-CM | POA: Diagnosis not present

## 2021-11-14 DIAGNOSIS — M5104 Intervertebral disc disorders with myelopathy, thoracic region: Secondary | ICD-10-CM | POA: Diagnosis not present

## 2021-11-14 DIAGNOSIS — J45909 Unspecified asthma, uncomplicated: Secondary | ICD-10-CM | POA: Diagnosis not present

## 2021-11-14 DIAGNOSIS — I10 Essential (primary) hypertension: Secondary | ICD-10-CM | POA: Diagnosis not present

## 2021-11-14 DIAGNOSIS — R69 Illness, unspecified: Secondary | ICD-10-CM | POA: Diagnosis not present

## 2021-11-14 DIAGNOSIS — G8220 Paraplegia, unspecified: Secondary | ICD-10-CM | POA: Diagnosis not present

## 2021-11-14 NOTE — Telephone Encounter (Signed)
Called pt regarding her virtual appointment that was scheduled for today, 11/14/21 @ 4pm, informed her that provider needs her to be seen in office. Pt states that is not what pcp advised her of last virtual visit, and that she only needed lab work. Per Dr Volanda Napoleon, it is needed for pt to be seen in person here or with neurologist. Advised patient that Elmyra Ricks from home health, was informed that she needed to be seen in person, pt kept stating that she is not able to be seen in person.   Informed Dr Volanda Napoleon that pt states she is not able to be seen, pcp stated there is no need for a virtual and that she did not refuse home health to do labs, but pt needs to be seen in office as she has not been seen since 2020.   Called patient back, no answer, per FYI left message. Message informed patient that appointment will be cancelled per provider due to pt needing to be seen in office, not virtually.

## 2021-11-16 DIAGNOSIS — N319 Neuromuscular dysfunction of bladder, unspecified: Secondary | ICD-10-CM | POA: Diagnosis not present

## 2021-11-16 DIAGNOSIS — M5416 Radiculopathy, lumbar region: Secondary | ICD-10-CM | POA: Diagnosis not present

## 2021-11-16 DIAGNOSIS — M5104 Intervertebral disc disorders with myelopathy, thoracic region: Secondary | ICD-10-CM | POA: Diagnosis not present

## 2021-11-16 DIAGNOSIS — R69 Illness, unspecified: Secondary | ICD-10-CM | POA: Diagnosis not present

## 2021-11-16 DIAGNOSIS — D638 Anemia in other chronic diseases classified elsewhere: Secondary | ICD-10-CM | POA: Diagnosis not present

## 2021-11-16 DIAGNOSIS — G8220 Paraplegia, unspecified: Secondary | ICD-10-CM | POA: Diagnosis not present

## 2021-11-16 DIAGNOSIS — L89892 Pressure ulcer of other site, stage 2: Secondary | ICD-10-CM | POA: Diagnosis not present

## 2021-11-16 DIAGNOSIS — L89322 Pressure ulcer of left buttock, stage 2: Secondary | ICD-10-CM | POA: Diagnosis not present

## 2021-11-16 DIAGNOSIS — J45909 Unspecified asthma, uncomplicated: Secondary | ICD-10-CM | POA: Diagnosis not present

## 2021-11-16 DIAGNOSIS — I10 Essential (primary) hypertension: Secondary | ICD-10-CM | POA: Diagnosis not present

## 2021-11-20 DIAGNOSIS — L89322 Pressure ulcer of left buttock, stage 2: Secondary | ICD-10-CM | POA: Diagnosis not present

## 2021-11-20 DIAGNOSIS — I10 Essential (primary) hypertension: Secondary | ICD-10-CM | POA: Diagnosis not present

## 2021-11-20 DIAGNOSIS — D638 Anemia in other chronic diseases classified elsewhere: Secondary | ICD-10-CM | POA: Diagnosis not present

## 2021-11-20 DIAGNOSIS — N319 Neuromuscular dysfunction of bladder, unspecified: Secondary | ICD-10-CM | POA: Diagnosis not present

## 2021-11-20 DIAGNOSIS — M5416 Radiculopathy, lumbar region: Secondary | ICD-10-CM | POA: Diagnosis not present

## 2021-11-20 DIAGNOSIS — J45909 Unspecified asthma, uncomplicated: Secondary | ICD-10-CM | POA: Diagnosis not present

## 2021-11-20 DIAGNOSIS — M5104 Intervertebral disc disorders with myelopathy, thoracic region: Secondary | ICD-10-CM | POA: Diagnosis not present

## 2021-11-20 DIAGNOSIS — L89892 Pressure ulcer of other site, stage 2: Secondary | ICD-10-CM | POA: Diagnosis not present

## 2021-11-20 DIAGNOSIS — G8220 Paraplegia, unspecified: Secondary | ICD-10-CM | POA: Diagnosis not present

## 2021-11-20 DIAGNOSIS — R69 Illness, unspecified: Secondary | ICD-10-CM | POA: Diagnosis not present

## 2021-11-21 DIAGNOSIS — M5104 Intervertebral disc disorders with myelopathy, thoracic region: Secondary | ICD-10-CM | POA: Diagnosis not present

## 2021-11-21 DIAGNOSIS — L89892 Pressure ulcer of other site, stage 2: Secondary | ICD-10-CM | POA: Diagnosis not present

## 2021-11-21 DIAGNOSIS — L89322 Pressure ulcer of left buttock, stage 2: Secondary | ICD-10-CM | POA: Diagnosis not present

## 2021-11-21 DIAGNOSIS — N319 Neuromuscular dysfunction of bladder, unspecified: Secondary | ICD-10-CM | POA: Diagnosis not present

## 2021-11-21 DIAGNOSIS — G8220 Paraplegia, unspecified: Secondary | ICD-10-CM | POA: Diagnosis not present

## 2021-11-21 DIAGNOSIS — R69 Illness, unspecified: Secondary | ICD-10-CM | POA: Diagnosis not present

## 2021-11-21 DIAGNOSIS — I10 Essential (primary) hypertension: Secondary | ICD-10-CM | POA: Diagnosis not present

## 2021-11-21 DIAGNOSIS — M5416 Radiculopathy, lumbar region: Secondary | ICD-10-CM | POA: Diagnosis not present

## 2021-11-21 DIAGNOSIS — D638 Anemia in other chronic diseases classified elsewhere: Secondary | ICD-10-CM | POA: Diagnosis not present

## 2021-11-21 DIAGNOSIS — J45909 Unspecified asthma, uncomplicated: Secondary | ICD-10-CM | POA: Diagnosis not present

## 2021-11-22 DIAGNOSIS — I10 Essential (primary) hypertension: Secondary | ICD-10-CM | POA: Diagnosis not present

## 2021-11-22 DIAGNOSIS — N319 Neuromuscular dysfunction of bladder, unspecified: Secondary | ICD-10-CM | POA: Diagnosis not present

## 2021-11-22 DIAGNOSIS — M5416 Radiculopathy, lumbar region: Secondary | ICD-10-CM | POA: Diagnosis not present

## 2021-11-22 DIAGNOSIS — R69 Illness, unspecified: Secondary | ICD-10-CM | POA: Diagnosis not present

## 2021-11-22 DIAGNOSIS — D638 Anemia in other chronic diseases classified elsewhere: Secondary | ICD-10-CM | POA: Diagnosis not present

## 2021-11-22 DIAGNOSIS — G8220 Paraplegia, unspecified: Secondary | ICD-10-CM | POA: Diagnosis not present

## 2021-11-22 DIAGNOSIS — J45909 Unspecified asthma, uncomplicated: Secondary | ICD-10-CM | POA: Diagnosis not present

## 2021-11-22 DIAGNOSIS — M5104 Intervertebral disc disorders with myelopathy, thoracic region: Secondary | ICD-10-CM | POA: Diagnosis not present

## 2021-11-22 DIAGNOSIS — L89322 Pressure ulcer of left buttock, stage 2: Secondary | ICD-10-CM | POA: Diagnosis not present

## 2021-11-22 DIAGNOSIS — L89892 Pressure ulcer of other site, stage 2: Secondary | ICD-10-CM | POA: Diagnosis not present

## 2021-11-26 ENCOUNTER — Telehealth: Payer: Self-pay | Admitting: Family Medicine

## 2021-11-26 DIAGNOSIS — L89892 Pressure ulcer of other site, stage 2: Secondary | ICD-10-CM | POA: Diagnosis not present

## 2021-11-26 DIAGNOSIS — N319 Neuromuscular dysfunction of bladder, unspecified: Secondary | ICD-10-CM | POA: Diagnosis not present

## 2021-11-26 DIAGNOSIS — D638 Anemia in other chronic diseases classified elsewhere: Secondary | ICD-10-CM | POA: Diagnosis not present

## 2021-11-26 DIAGNOSIS — G8220 Paraplegia, unspecified: Secondary | ICD-10-CM | POA: Diagnosis not present

## 2021-11-26 DIAGNOSIS — I10 Essential (primary) hypertension: Secondary | ICD-10-CM | POA: Diagnosis not present

## 2021-11-26 DIAGNOSIS — M5416 Radiculopathy, lumbar region: Secondary | ICD-10-CM | POA: Diagnosis not present

## 2021-11-26 DIAGNOSIS — R69 Illness, unspecified: Secondary | ICD-10-CM | POA: Diagnosis not present

## 2021-11-26 DIAGNOSIS — J45909 Unspecified asthma, uncomplicated: Secondary | ICD-10-CM | POA: Diagnosis not present

## 2021-11-26 DIAGNOSIS — M5104 Intervertebral disc disorders with myelopathy, thoracic region: Secondary | ICD-10-CM | POA: Diagnosis not present

## 2021-11-26 DIAGNOSIS — L89322 Pressure ulcer of left buttock, stage 2: Secondary | ICD-10-CM | POA: Diagnosis not present

## 2021-11-26 NOTE — Telephone Encounter (Signed)
Melissa from [facility] call and stated she need verbal order for occupational therapy for 1 wk 1 ,2 wk's 4 and 1 wk 3 Melissa ph # is [phone].

## 2021-11-27 DIAGNOSIS — M5104 Intervertebral disc disorders with myelopathy, thoracic region: Secondary | ICD-10-CM | POA: Diagnosis not present

## 2021-11-27 DIAGNOSIS — I10 Essential (primary) hypertension: Secondary | ICD-10-CM | POA: Diagnosis not present

## 2021-11-27 DIAGNOSIS — G8220 Paraplegia, unspecified: Secondary | ICD-10-CM | POA: Diagnosis not present

## 2021-11-27 DIAGNOSIS — N319 Neuromuscular dysfunction of bladder, unspecified: Secondary | ICD-10-CM | POA: Diagnosis not present

## 2021-11-27 DIAGNOSIS — L89322 Pressure ulcer of left buttock, stage 2: Secondary | ICD-10-CM | POA: Diagnosis not present

## 2021-11-27 DIAGNOSIS — J45909 Unspecified asthma, uncomplicated: Secondary | ICD-10-CM | POA: Diagnosis not present

## 2021-11-27 DIAGNOSIS — R69 Illness, unspecified: Secondary | ICD-10-CM | POA: Diagnosis not present

## 2021-11-27 DIAGNOSIS — D638 Anemia in other chronic diseases classified elsewhere: Secondary | ICD-10-CM | POA: Diagnosis not present

## 2021-11-27 DIAGNOSIS — M5416 Radiculopathy, lumbar region: Secondary | ICD-10-CM | POA: Diagnosis not present

## 2021-11-27 DIAGNOSIS — L89892 Pressure ulcer of other site, stage 2: Secondary | ICD-10-CM | POA: Diagnosis not present

## 2021-11-27 NOTE — Telephone Encounter (Signed)
Orders were sent via community msg on 11/11.

## 2021-11-27 NOTE — Telephone Encounter (Signed)
Called, no answer. Per Dr Volanda Napoleon, VO given.

## 2021-11-28 DIAGNOSIS — M5104 Intervertebral disc disorders with myelopathy, thoracic region: Secondary | ICD-10-CM | POA: Diagnosis not present

## 2021-11-28 DIAGNOSIS — Z6841 Body Mass Index (BMI) 40.0 and over, adult: Secondary | ICD-10-CM | POA: Diagnosis not present

## 2021-11-28 DIAGNOSIS — K592 Neurogenic bowel, not elsewhere classified: Secondary | ICD-10-CM | POA: Diagnosis not present

## 2021-11-28 DIAGNOSIS — N319 Neuromuscular dysfunction of bladder, unspecified: Secondary | ICD-10-CM | POA: Diagnosis not present

## 2021-11-28 DIAGNOSIS — M961 Postlaminectomy syndrome, not elsewhere classified: Secondary | ICD-10-CM | POA: Diagnosis not present

## 2021-11-28 DIAGNOSIS — R54 Age-related physical debility: Secondary | ICD-10-CM | POA: Diagnosis not present

## 2021-11-28 DIAGNOSIS — G4700 Insomnia, unspecified: Secondary | ICD-10-CM | POA: Diagnosis not present

## 2021-11-30 DIAGNOSIS — K592 Neurogenic bowel, not elsewhere classified: Secondary | ICD-10-CM | POA: Diagnosis not present

## 2021-11-30 DIAGNOSIS — J45909 Unspecified asthma, uncomplicated: Secondary | ICD-10-CM | POA: Diagnosis not present

## 2021-11-30 DIAGNOSIS — G959 Disease of spinal cord, unspecified: Secondary | ICD-10-CM | POA: Diagnosis not present

## 2021-11-30 DIAGNOSIS — L89892 Pressure ulcer of other site, stage 2: Secondary | ICD-10-CM | POA: Diagnosis not present

## 2021-11-30 DIAGNOSIS — D638 Anemia in other chronic diseases classified elsewhere: Secondary | ICD-10-CM | POA: Diagnosis not present

## 2021-11-30 DIAGNOSIS — I10 Essential (primary) hypertension: Secondary | ICD-10-CM | POA: Diagnosis not present

## 2021-11-30 DIAGNOSIS — L89322 Pressure ulcer of left buttock, stage 2: Secondary | ICD-10-CM | POA: Diagnosis not present

## 2021-11-30 DIAGNOSIS — R69 Illness, unspecified: Secondary | ICD-10-CM | POA: Diagnosis not present

## 2021-11-30 DIAGNOSIS — E669 Obesity, unspecified: Secondary | ICD-10-CM | POA: Diagnosis not present

## 2021-11-30 DIAGNOSIS — M5416 Radiculopathy, lumbar region: Secondary | ICD-10-CM | POA: Diagnosis not present

## 2021-11-30 DIAGNOSIS — M5104 Intervertebral disc disorders with myelopathy, thoracic region: Secondary | ICD-10-CM | POA: Diagnosis not present

## 2021-11-30 DIAGNOSIS — N319 Neuromuscular dysfunction of bladder, unspecified: Secondary | ICD-10-CM | POA: Diagnosis not present

## 2021-11-30 DIAGNOSIS — G8220 Paraplegia, unspecified: Secondary | ICD-10-CM | POA: Diagnosis not present

## 2021-11-30 DIAGNOSIS — M4714 Other spondylosis with myelopathy, thoracic region: Secondary | ICD-10-CM | POA: Diagnosis not present

## 2021-12-04 DIAGNOSIS — K592 Neurogenic bowel, not elsewhere classified: Secondary | ICD-10-CM | POA: Diagnosis not present

## 2021-12-04 DIAGNOSIS — L89892 Pressure ulcer of other site, stage 2: Secondary | ICD-10-CM | POA: Diagnosis not present

## 2021-12-04 DIAGNOSIS — I10 Essential (primary) hypertension: Secondary | ICD-10-CM | POA: Diagnosis not present

## 2021-12-04 DIAGNOSIS — J45909 Unspecified asthma, uncomplicated: Secondary | ICD-10-CM | POA: Diagnosis not present

## 2021-12-04 DIAGNOSIS — N319 Neuromuscular dysfunction of bladder, unspecified: Secondary | ICD-10-CM | POA: Diagnosis not present

## 2021-12-04 DIAGNOSIS — D638 Anemia in other chronic diseases classified elsewhere: Secondary | ICD-10-CM | POA: Diagnosis not present

## 2021-12-04 DIAGNOSIS — G8220 Paraplegia, unspecified: Secondary | ICD-10-CM | POA: Diagnosis not present

## 2021-12-04 DIAGNOSIS — M5104 Intervertebral disc disorders with myelopathy, thoracic region: Secondary | ICD-10-CM | POA: Diagnosis not present

## 2021-12-04 DIAGNOSIS — M5416 Radiculopathy, lumbar region: Secondary | ICD-10-CM | POA: Diagnosis not present

## 2021-12-04 DIAGNOSIS — R69 Illness, unspecified: Secondary | ICD-10-CM | POA: Diagnosis not present

## 2021-12-05 DIAGNOSIS — N319 Neuromuscular dysfunction of bladder, unspecified: Secondary | ICD-10-CM | POA: Diagnosis not present

## 2021-12-05 DIAGNOSIS — K592 Neurogenic bowel, not elsewhere classified: Secondary | ICD-10-CM | POA: Diagnosis not present

## 2021-12-05 DIAGNOSIS — R69 Illness, unspecified: Secondary | ICD-10-CM | POA: Diagnosis not present

## 2021-12-05 DIAGNOSIS — G8220 Paraplegia, unspecified: Secondary | ICD-10-CM | POA: Diagnosis not present

## 2021-12-05 DIAGNOSIS — M5416 Radiculopathy, lumbar region: Secondary | ICD-10-CM | POA: Diagnosis not present

## 2021-12-05 DIAGNOSIS — D638 Anemia in other chronic diseases classified elsewhere: Secondary | ICD-10-CM | POA: Diagnosis not present

## 2021-12-05 DIAGNOSIS — L89892 Pressure ulcer of other site, stage 2: Secondary | ICD-10-CM | POA: Diagnosis not present

## 2021-12-05 DIAGNOSIS — J45909 Unspecified asthma, uncomplicated: Secondary | ICD-10-CM | POA: Diagnosis not present

## 2021-12-05 DIAGNOSIS — I10 Essential (primary) hypertension: Secondary | ICD-10-CM | POA: Diagnosis not present

## 2021-12-05 DIAGNOSIS — M5104 Intervertebral disc disorders with myelopathy, thoracic region: Secondary | ICD-10-CM | POA: Diagnosis not present

## 2021-12-07 DIAGNOSIS — M5104 Intervertebral disc disorders with myelopathy, thoracic region: Secondary | ICD-10-CM | POA: Diagnosis not present

## 2021-12-07 DIAGNOSIS — M5416 Radiculopathy, lumbar region: Secondary | ICD-10-CM | POA: Diagnosis not present

## 2021-12-07 DIAGNOSIS — I10 Essential (primary) hypertension: Secondary | ICD-10-CM | POA: Diagnosis not present

## 2021-12-07 DIAGNOSIS — D638 Anemia in other chronic diseases classified elsewhere: Secondary | ICD-10-CM | POA: Diagnosis not present

## 2021-12-07 DIAGNOSIS — N319 Neuromuscular dysfunction of bladder, unspecified: Secondary | ICD-10-CM | POA: Diagnosis not present

## 2021-12-07 DIAGNOSIS — J45909 Unspecified asthma, uncomplicated: Secondary | ICD-10-CM | POA: Diagnosis not present

## 2021-12-07 DIAGNOSIS — G8220 Paraplegia, unspecified: Secondary | ICD-10-CM | POA: Diagnosis not present

## 2021-12-07 DIAGNOSIS — L89892 Pressure ulcer of other site, stage 2: Secondary | ICD-10-CM | POA: Diagnosis not present

## 2021-12-07 DIAGNOSIS — R69 Illness, unspecified: Secondary | ICD-10-CM | POA: Diagnosis not present

## 2021-12-07 DIAGNOSIS — K592 Neurogenic bowel, not elsewhere classified: Secondary | ICD-10-CM | POA: Diagnosis not present

## 2021-12-10 DIAGNOSIS — N319 Neuromuscular dysfunction of bladder, unspecified: Secondary | ICD-10-CM | POA: Diagnosis not present

## 2021-12-10 DIAGNOSIS — K592 Neurogenic bowel, not elsewhere classified: Secondary | ICD-10-CM | POA: Diagnosis not present

## 2021-12-10 DIAGNOSIS — R69 Illness, unspecified: Secondary | ICD-10-CM | POA: Diagnosis not present

## 2021-12-10 DIAGNOSIS — I10 Essential (primary) hypertension: Secondary | ICD-10-CM | POA: Diagnosis not present

## 2021-12-10 DIAGNOSIS — G8220 Paraplegia, unspecified: Secondary | ICD-10-CM | POA: Diagnosis not present

## 2021-12-10 DIAGNOSIS — D638 Anemia in other chronic diseases classified elsewhere: Secondary | ICD-10-CM | POA: Diagnosis not present

## 2021-12-10 DIAGNOSIS — J45909 Unspecified asthma, uncomplicated: Secondary | ICD-10-CM | POA: Diagnosis not present

## 2021-12-10 DIAGNOSIS — M5104 Intervertebral disc disorders with myelopathy, thoracic region: Secondary | ICD-10-CM | POA: Diagnosis not present

## 2021-12-10 DIAGNOSIS — M5416 Radiculopathy, lumbar region: Secondary | ICD-10-CM | POA: Diagnosis not present

## 2021-12-10 DIAGNOSIS — L89892 Pressure ulcer of other site, stage 2: Secondary | ICD-10-CM | POA: Diagnosis not present

## 2021-12-12 DIAGNOSIS — G8220 Paraplegia, unspecified: Secondary | ICD-10-CM | POA: Diagnosis not present

## 2021-12-12 DIAGNOSIS — R69 Illness, unspecified: Secondary | ICD-10-CM | POA: Diagnosis not present

## 2021-12-12 DIAGNOSIS — L89892 Pressure ulcer of other site, stage 2: Secondary | ICD-10-CM | POA: Diagnosis not present

## 2021-12-12 DIAGNOSIS — I10 Essential (primary) hypertension: Secondary | ICD-10-CM | POA: Diagnosis not present

## 2021-12-12 DIAGNOSIS — K592 Neurogenic bowel, not elsewhere classified: Secondary | ICD-10-CM | POA: Diagnosis not present

## 2021-12-12 DIAGNOSIS — M5416 Radiculopathy, lumbar region: Secondary | ICD-10-CM | POA: Diagnosis not present

## 2021-12-12 DIAGNOSIS — M5104 Intervertebral disc disorders with myelopathy, thoracic region: Secondary | ICD-10-CM | POA: Diagnosis not present

## 2021-12-12 DIAGNOSIS — J45909 Unspecified asthma, uncomplicated: Secondary | ICD-10-CM | POA: Diagnosis not present

## 2021-12-12 DIAGNOSIS — N319 Neuromuscular dysfunction of bladder, unspecified: Secondary | ICD-10-CM | POA: Diagnosis not present

## 2021-12-12 DIAGNOSIS — D638 Anemia in other chronic diseases classified elsewhere: Secondary | ICD-10-CM | POA: Diagnosis not present

## 2021-12-13 DIAGNOSIS — R69 Illness, unspecified: Secondary | ICD-10-CM | POA: Diagnosis not present

## 2021-12-13 DIAGNOSIS — M5104 Intervertebral disc disorders with myelopathy, thoracic region: Secondary | ICD-10-CM | POA: Diagnosis not present

## 2021-12-13 DIAGNOSIS — M5416 Radiculopathy, lumbar region: Secondary | ICD-10-CM | POA: Diagnosis not present

## 2021-12-13 DIAGNOSIS — I10 Essential (primary) hypertension: Secondary | ICD-10-CM | POA: Diagnosis not present

## 2021-12-13 DIAGNOSIS — K592 Neurogenic bowel, not elsewhere classified: Secondary | ICD-10-CM | POA: Diagnosis not present

## 2021-12-13 DIAGNOSIS — N319 Neuromuscular dysfunction of bladder, unspecified: Secondary | ICD-10-CM | POA: Diagnosis not present

## 2021-12-13 DIAGNOSIS — G8220 Paraplegia, unspecified: Secondary | ICD-10-CM | POA: Diagnosis not present

## 2021-12-13 DIAGNOSIS — D638 Anemia in other chronic diseases classified elsewhere: Secondary | ICD-10-CM | POA: Diagnosis not present

## 2021-12-13 DIAGNOSIS — J45909 Unspecified asthma, uncomplicated: Secondary | ICD-10-CM | POA: Diagnosis not present

## 2021-12-13 DIAGNOSIS — L89892 Pressure ulcer of other site, stage 2: Secondary | ICD-10-CM | POA: Diagnosis not present

## 2021-12-14 DIAGNOSIS — I10 Essential (primary) hypertension: Secondary | ICD-10-CM | POA: Diagnosis not present

## 2021-12-14 DIAGNOSIS — L89892 Pressure ulcer of other site, stage 2: Secondary | ICD-10-CM | POA: Diagnosis not present

## 2021-12-14 DIAGNOSIS — D638 Anemia in other chronic diseases classified elsewhere: Secondary | ICD-10-CM | POA: Diagnosis not present

## 2021-12-14 DIAGNOSIS — J45909 Unspecified asthma, uncomplicated: Secondary | ICD-10-CM | POA: Diagnosis not present

## 2021-12-14 DIAGNOSIS — M5104 Intervertebral disc disorders with myelopathy, thoracic region: Secondary | ICD-10-CM | POA: Diagnosis not present

## 2021-12-14 DIAGNOSIS — K592 Neurogenic bowel, not elsewhere classified: Secondary | ICD-10-CM | POA: Diagnosis not present

## 2021-12-14 DIAGNOSIS — R69 Illness, unspecified: Secondary | ICD-10-CM | POA: Diagnosis not present

## 2021-12-14 DIAGNOSIS — N319 Neuromuscular dysfunction of bladder, unspecified: Secondary | ICD-10-CM | POA: Diagnosis not present

## 2021-12-14 DIAGNOSIS — G8220 Paraplegia, unspecified: Secondary | ICD-10-CM | POA: Diagnosis not present

## 2021-12-14 DIAGNOSIS — M5416 Radiculopathy, lumbar region: Secondary | ICD-10-CM | POA: Diagnosis not present

## 2021-12-18 ENCOUNTER — Telehealth: Payer: Self-pay | Admitting: Family Medicine

## 2021-12-18 NOTE — Telephone Encounter (Signed)
Paperwork was on the desk, faxed in signed paperwork and confirmation received.

## 2021-12-18 NOTE — Telephone Encounter (Signed)
Corene Cornea from [facility] called to check on paperwork they faxed on the 12/18. I spoke with Madaline Guthrie and she stated that she was faxing it momentarily. I relayed this information to him and he verbalized understanding.     Please advise

## 2021-12-19 DIAGNOSIS — D638 Anemia in other chronic diseases classified elsewhere: Secondary | ICD-10-CM | POA: Diagnosis not present

## 2021-12-19 DIAGNOSIS — I10 Essential (primary) hypertension: Secondary | ICD-10-CM | POA: Diagnosis not present

## 2021-12-19 DIAGNOSIS — L89892 Pressure ulcer of other site, stage 2: Secondary | ICD-10-CM | POA: Diagnosis not present

## 2021-12-19 DIAGNOSIS — M5416 Radiculopathy, lumbar region: Secondary | ICD-10-CM | POA: Diagnosis not present

## 2021-12-19 DIAGNOSIS — R69 Illness, unspecified: Secondary | ICD-10-CM | POA: Diagnosis not present

## 2021-12-19 DIAGNOSIS — N319 Neuromuscular dysfunction of bladder, unspecified: Secondary | ICD-10-CM | POA: Diagnosis not present

## 2021-12-19 DIAGNOSIS — M5104 Intervertebral disc disorders with myelopathy, thoracic region: Secondary | ICD-10-CM | POA: Diagnosis not present

## 2021-12-19 DIAGNOSIS — G8220 Paraplegia, unspecified: Secondary | ICD-10-CM | POA: Diagnosis not present

## 2021-12-19 DIAGNOSIS — K592 Neurogenic bowel, not elsewhere classified: Secondary | ICD-10-CM | POA: Diagnosis not present

## 2021-12-19 DIAGNOSIS — J45909 Unspecified asthma, uncomplicated: Secondary | ICD-10-CM | POA: Diagnosis not present

## 2021-12-21 DIAGNOSIS — M5416 Radiculopathy, lumbar region: Secondary | ICD-10-CM | POA: Diagnosis not present

## 2021-12-21 DIAGNOSIS — R69 Illness, unspecified: Secondary | ICD-10-CM | POA: Diagnosis not present

## 2021-12-21 DIAGNOSIS — N319 Neuromuscular dysfunction of bladder, unspecified: Secondary | ICD-10-CM | POA: Diagnosis not present

## 2021-12-21 DIAGNOSIS — J45909 Unspecified asthma, uncomplicated: Secondary | ICD-10-CM | POA: Diagnosis not present

## 2021-12-21 DIAGNOSIS — K592 Neurogenic bowel, not elsewhere classified: Secondary | ICD-10-CM | POA: Diagnosis not present

## 2021-12-21 DIAGNOSIS — I10 Essential (primary) hypertension: Secondary | ICD-10-CM | POA: Diagnosis not present

## 2021-12-21 DIAGNOSIS — L89892 Pressure ulcer of other site, stage 2: Secondary | ICD-10-CM | POA: Diagnosis not present

## 2021-12-21 DIAGNOSIS — G8220 Paraplegia, unspecified: Secondary | ICD-10-CM | POA: Diagnosis not present

## 2021-12-21 DIAGNOSIS — M5104 Intervertebral disc disorders with myelopathy, thoracic region: Secondary | ICD-10-CM | POA: Diagnosis not present

## 2021-12-21 DIAGNOSIS — D638 Anemia in other chronic diseases classified elsewhere: Secondary | ICD-10-CM | POA: Diagnosis not present

## 2022-01-23 ENCOUNTER — Other Ambulatory Visit: Payer: Self-pay | Admitting: Family Medicine

## 2022-01-23 DIAGNOSIS — G959 Disease of spinal cord, unspecified: Secondary | ICD-10-CM

## 2022-01-30 ENCOUNTER — Telehealth: Payer: Self-pay | Admitting: Family Medicine

## 2022-01-30 NOTE — Telephone Encounter (Signed)
Anselm Pancoast from [facility] called to get Recert for frequency of one week three for ADL's and exercises for patient.    Good callback number is  [phone]    Please advise

## 2022-01-31 NOTE — Telephone Encounter (Signed)
Philis Nettle is calling checking on verbal order

## 2022-02-01 NOTE — Telephone Encounter (Signed)
Ok

## 2022-02-01 NOTE — Telephone Encounter (Signed)
Verbal orders for Recert for frequency of once/week x three weeks for ADL's and exercises for patient given to [facility] who verb understanding.

## 2022-02-21 ENCOUNTER — Other Ambulatory Visit: Payer: Self-pay | Admitting: Family Medicine

## 2022-02-21 ENCOUNTER — Telehealth: Payer: Self-pay | Admitting: Family Medicine

## 2022-02-21 DIAGNOSIS — G959 Disease of spinal cord, unspecified: Secondary | ICD-10-CM

## 2022-02-21 NOTE — Telephone Encounter (Signed)
Ellen Hill with Ellen [facility] called in stating that the patient was supposed to be discharged from [facility] but her home have a stomach bug so they're requesting for next week. ? ?Ellen Hill could be contacted at [phone]. ? ?Please advise. ?

## 2022-02-22 NOTE — Telephone Encounter (Signed)
Ellen Hill with [facility] is calling in regards the below message. Ellen  phone number has been updated [phone] ?

## 2022-03-01 NOTE — Telephone Encounter (Signed)
Ok

## 2022-08-08 ENCOUNTER — Telehealth: Payer: Self-pay

## 2022-08-08 ENCOUNTER — Ambulatory Visit: Payer: Medicare HMO

## 2022-08-08 NOTE — Telephone Encounter (Signed)
Contacted patient on preferred number listed in notes for scheduled AWV. Patient stated no longer a patient of this practice.

## 2022-08-14 ENCOUNTER — Telehealth: Payer: Self-pay | Admitting: Family Medicine

## 2022-08-14 NOTE — Telephone Encounter (Signed)
Spoke to patient to schedule AWV.  She stated she changed PCP and is seeing [facility] now.

## 2022-09-15 ENCOUNTER — Other Ambulatory Visit: Payer: Self-pay | Admitting: Family Medicine

## 2022-09-15 DIAGNOSIS — G4700 Insomnia, unspecified: Secondary | ICD-10-CM

## 2022-10-02 ENCOUNTER — Emergency Department (HOSPITAL_COMMUNITY)
Admission: EM | Admit: 2022-10-02 | Discharge: 2022-10-03 | Disposition: A | Discharge status: Home / Self Care | Payer: Medicare HMO | Attending: Emergency Medicine | Admitting: Emergency Medicine

## 2022-10-02 ENCOUNTER — Encounter (HOSPITAL_COMMUNITY): Payer: Self-pay | Admitting: Emergency Medicine

## 2022-10-02 ENCOUNTER — Other Ambulatory Visit: Payer: Self-pay

## 2022-10-02 ENCOUNTER — Emergency Department (HOSPITAL_COMMUNITY): Payer: Medicare HMO

## 2022-10-02 DIAGNOSIS — D649 Anemia, unspecified: Secondary | ICD-10-CM | POA: Diagnosis not present

## 2022-10-02 DIAGNOSIS — N132 Hydronephrosis with renal and ureteral calculous obstruction: Secondary | ICD-10-CM | POA: Insufficient documentation

## 2022-10-02 DIAGNOSIS — R140 Abdominal distension (gaseous): Secondary | ICD-10-CM | POA: Diagnosis not present

## 2022-10-02 DIAGNOSIS — N200 Calculus of kidney: Secondary | ICD-10-CM

## 2022-10-02 DIAGNOSIS — R109 Unspecified abdominal pain: Secondary | ICD-10-CM | POA: Diagnosis present

## 2022-10-02 LAB — CBC WITH DIFFERENTIAL/PLATELET
Abs Immature Granulocytes: 0.02 10*3/uL (ref 0.00–0.07)
Basophils Absolute: 0 10*3/uL (ref 0.0–0.1)
Basophils Relative: 0 %
Eosinophils Absolute: 0.3 10*3/uL (ref 0.0–0.5)
Eosinophils Relative: 4 %
HCT: 28.7 % — ABNORMAL LOW (ref 36.0–46.0)
Hemoglobin: 7.7 g/dL — ABNORMAL LOW (ref 12.0–15.0)
Immature Granulocytes: 0 %
Lymphocytes Relative: 27 %
Lymphs Abs: 2 10*3/uL (ref 0.7–4.0)
MCH: 17 pg — ABNORMAL LOW (ref 26.0–34.0)
MCHC: 26.8 g/dL — ABNORMAL LOW (ref 30.0–36.0)
MCV: 63.5 fL — ABNORMAL LOW (ref 80.0–100.0)
Monocytes Absolute: 0.4 10*3/uL (ref 0.1–1.0)
Monocytes Relative: 5 %
Neutro Abs: 4.6 10*3/uL (ref 1.7–7.7)
Neutrophils Relative %: 64 %
Platelets: 295 10*3/uL (ref 150–400)
RBC: 4.52 MIL/uL (ref 3.87–5.11)
RDW: 22 % — ABNORMAL HIGH (ref 11.5–15.5)
WBC: 7.3 10*3/uL (ref 4.0–10.5)
nRBC: 0 % (ref 0.0–0.2)

## 2022-10-02 LAB — URINALYSIS, ROUTINE W REFLEX MICROSCOPIC
Bilirubin Urine: NEGATIVE
Glucose, UA: NEGATIVE mg/dL
Ketones, ur: NEGATIVE mg/dL
Leukocytes,Ua: NEGATIVE
Nitrite: NEGATIVE
Protein, ur: NEGATIVE mg/dL
Specific Gravity, Urine: 1.017 (ref 1.005–1.030)
pH: 6 (ref 5.0–8.0)

## 2022-10-02 LAB — COMPREHENSIVE METABOLIC PANEL
ALT: 16 U/L (ref 0–44)
AST: 20 U/L (ref 15–41)
Albumin: 3.4 g/dL — ABNORMAL LOW (ref 3.5–5.0)
Alkaline Phosphatase: 65 U/L (ref 38–126)
Anion gap: 7 (ref 5–15)
BUN: 13 mg/dL (ref 6–20)
CO2: 25 mmol/L (ref 22–32)
Calcium: 8.4 mg/dL — ABNORMAL LOW (ref 8.9–10.3)
Chloride: 107 mmol/L (ref 98–111)
Creatinine, Ser: 0.78 mg/dL (ref 0.44–1.00)
GFR, Estimated: >60 mL/min (ref >60)
Glucose, Bld: 101 mg/dL — ABNORMAL HIGH (ref 70–99)
Potassium: 3.1 mmol/L — ABNORMAL LOW (ref 3.5–5.1)
Sodium: 139 mmol/L (ref 135–145)
Total Bilirubin: 0.4 mg/dL (ref 0.3–1.2)
Total Protein: 7.5 g/dL (ref 6.5–8.1)

## 2022-10-02 LAB — LIPASE, BLOOD: Lipase: 27 U/L (ref 11–51)

## 2022-10-02 LAB — POC OCCULT BLOOD, ED: Fecal Occult Bld: POSITIVE — AB

## 2022-10-02 MED ORDER — IOHEXOL 300 MG/ML  SOLN
100.0000 mL | Freq: Once | INTRAMUSCULAR | Status: AC | PRN
  Administered 2022-10-02: 100 mL via INTRAVENOUS

## 2022-10-02 MED ORDER — SODIUM CHLORIDE 0.9 % IV SOLN: Freq: Once | INTRAVENOUS | Status: AC

## 2022-10-02 NOTE — ED Provider Notes (Signed)
[facility] DEPT Provider Note   CSN: [id number] Arrival date & time: 10/02/22  1846     History  No chief complaint on file.   Ellen Hill is a 51 y.o. female.  HPI Patient reports she is having problems with constipation in the past.  She was treated with Linzess and has been doing well for a period of time.  However, she went from constipation to having constant, soupy stool.  Also she started getting a lot of cramping abdominal pain.  Her doctor had her stop Linzess about 2 weeks ago.  She reports the abdominal pain stopped but the soupy, persistent stool continued.  She reports she is has gotten abdominal bloating and discomfort.  She reports she has been able to eat soup and crackers but otherwise feels uncomfortable if she eats.  She has not started having vomiting.  Patient reports she went to see her doctor today and they felt that her abdomen was hard and distended.  They were concerned and sent her to the emergency department for further evaluation.  No difficulty or pain or burning with urination.  No cough or shortness of breath.    Home Medications Prior to Admission medications   Medication Sig Start Date End Date Taking? Authorizing Provider  omeprazole (PRILOSEC) 20 MG capsule Take 1 capsule (20 mg total) by mouth daily. 10/03/22  Yes Mirela Parsley, Jeannie Done, MD  baclofen (LIORESAL) 10 MG tablet TAKE 1 TABLET TWICE A DAY AT 8 AM AND 1 PM WITH 5 MG TABLET. 01/23/22   Billie Ruddy, MD  baclofen (LIORESAL) 20 MG tablet TAKE 1 TABLET EVERY DAY AT 6 PM 09/07/21   Billie Ruddy, MD  gabapentin (NEURONTIN) 400 MG capsule TAKE 1 CAPSULE 3 TIMES A DAY AT 8 AM, 1 PM, AND 6 PM 08/02/21   Billie Ruddy, MD  hydrochlorothiazide (HYDRODIURIL) 25 MG tablet TAKE 1 TABLET BY MOUTH EVERY DAY 11/07/21   Billie Ruddy, MD  ibuprofen (ADVIL) 800 MG tablet TAKE 1 TABLET BY MOUTH THREE TIMES A DAY 11/12/21   Billie Ruddy, MD  lactulose (CHRONULAC) 10  GM/15ML solution TAKE 30 MLS (20 G TOTAL) BY MOUTH 2 (TWO) TIMES DAILY. 10/03/21   Billie Ruddy, MD  nystatin (MYCOSTATIN/NYSTOP) powder APPLY TOPICALLY 2 TIMES DAILY AT 12 NOON AND 4 PM. 09/26/21   Billie Ruddy, MD  tamsulosin (FLOMAX) 0.4 MG CAPS capsule TAKE 1 CAPSULE (0.4 MG TOTAL) BY MOUTH DAILY AFTER SUPPER. Patient not taking: No sig reported 03/14/21   Billie Ruddy, MD      Allergies    Patient has no known allergies.    Review of Systems   Review of Systems  Physical Exam Updated Vital Signs BP 125/76 (BP Location: Right Arm)   Pulse 93   Temp 98.9 F (37.2 C) (Oral)   Resp 20   SpO2 100%  Physical Exam Constitutional:      Comments: Alert nontoxic.  No respiratory distress.  HENT:     Mouth/Throat:     Pharynx: Oropharynx is clear.  Eyes:     Extraocular Movements: Extraocular movements intact.  Cardiovascular:     Rate and Rhythm: Normal rate and regular rhythm.  Pulmonary:     Effort: Pulmonary effort is normal.     Breath sounds: Normal breath sounds.  Abdominal:     Comments: Abdomen feels distended.  Moderate diffuse upper abdominal discomfort to palpation.  Lower abdomen no significant tenderness.  No  rash in the groin folds.  Genitourinary:    Comments: Rectal exam is very soft brown stool.  No melena no frank blood. Musculoskeletal:     Comments: No peripheral edema.  Calves are soft and pliable.  Skin:    General: Skin is warm and dry.  Neurological:     General: No focal deficit present.     Mental Status: She is oriented to person, place, and time.     Motor: No weakness.     Coordination: Coordination normal.  Psychiatric:        Mood and Affect: Mood normal.     ED Results / Procedures / Treatments   Labs (all labs ordered are listed, but only abnormal results are displayed) Labs Reviewed  COMPREHENSIVE METABOLIC PANEL - Abnormal; Notable for the following components:      Result Value   Potassium 3.1 (*)    Glucose, Bld 101  (*)    Calcium 8.4 (*)    Albumin 3.4 (*)    All other components within normal limits  CBC WITH DIFFERENTIAL/PLATELET - Abnormal; Notable for the following components:   Hemoglobin 7.7 (*)    HCT 28.7 (*)    MCV 63.5 (*)    MCH 17.0 (*)    MCHC 26.8 (*)    RDW 22.0 (*)    All other components within normal limits  URINALYSIS, ROUTINE W REFLEX MICROSCOPIC - Abnormal; Notable for the following components:   Hgb urine dipstick SMALL (*)    Bacteria, UA RARE (*)    All other components within normal limits  POC OCCULT BLOOD, ED - Abnormal; Notable for the following components:   Fecal Occult Bld POSITIVE (*)    All other components within normal limits  LIPASE, BLOOD    EKG None  Radiology CT Abdomen Pelvis W Contrast  Result Date: 10/02/2022 CLINICAL DATA:  Abdominal pain EXAM: CT ABDOMEN AND PELVIS WITH CONTRAST TECHNIQUE: Multidetector CT imaging of the abdomen and pelvis was performed using the standard protocol following bolus administration of intravenous contrast. RADIATION DOSE REDUCTION: This exam was performed according to the departmental dose-optimization program which includes automated exposure control, adjustment of the mA and/or kV according to patient size and/or use of iterative reconstruction technique. CONTRAST:  152m OMNIPAQUE IOHEXOL 300 MG/ML  SOLN COMPARISON:  None Available. FINDINGS: Lower chest: Lung bases are clear. Hepatobiliary: Liver is within normal limits. Status post cholecystectomy. No intrahepatic or extrahepatic ductal dilatation. Pancreas: Within normal limits. Spleen: Within normal limits. Adrenals/Urinary Tract: Adrenal glands are within normal limits. 15 mm right upper pole renal cyst (series 2/image 40), measuring simple fluid density, benign (Bosniak I). No follow-up is recommended. 7 mm nonobstructing interpolar left renal calculus (series 2/image 49). Mild left hydronephrosis. Two proximal left ureteral calculi at the L3-4 level measuring up to  8 mm (coronal image 93). Bladder is within normal limits. Stomach/Bowel: Stomach is within normal limits. No evidence of bowel obstruction. Normal appendix (series 2/image 31). No colonic wall thickening or inflammatory changes. Vascular/Lymphatic: No evidence of abdominal aortic aneurysm. No suspicious abdominopelvic lymphadenopathy. Reproductive: Uterus is within normal limits. No adnexal masses. Other: No abdominopelvic ascites. Musculoskeletal: Degenerative changes of the lower thoracic spine. IMPRESSION: Two proximal left ureteral calculi measuring up to 8 mm. Associated mild left hydronephrosis. Additional 7 mm nonobstructing interpolar left renal calculus. Electronically Signed   By: SJulian HyM.D.   On: 10/02/2022 23:39    Procedures Procedures    Medications Ordered in ED Medications  pantoprazole (PROTONIX) EC tablet 20 mg (has no administration in time range)  0.9 %  sodium chloride infusion ( Intravenous New Bag/Given 10/02/22 2156)  iohexol (OMNIPAQUE) 300 MG/ML solution 100 mL (100 mLs Intravenous Contrast Given 10/02/22 2245)    ED Course/ Medical Decision Making/ A&P                           Medical Decision Making Amount and/or Complexity of Data Reviewed Labs: ordered. Radiology: ordered.  Risk Prescription drug management.   Patient presents as outlined.  She has had problems with constipation and abdominal pain.  She has recently taken off of Linzess and has now had persistent stool and abdominal distention.  No vomiting.  On exam abdomen does feel firm and distended.  We will proceed with basic lab work and CT scan.  CT scan returned with incidental identified left ureteral kidney stones.  Patient does not appear to be symptomatic from this.  No evidence of any bowel blockage or suggestion of colitis.  At this time patient also is anemic at 7.7.  She does not appear to have symptomatic anemia.  Blood pressures are stable and patient is not perceiving  shortness of breath or significant general weakness although she is at baseline bedbound.  No signs of active bleeding.  No hematemesis.  Stool is brown with no melena.  It does test occult positive however no evidence of any brisk/active blood loss.  Patient reports she is always been somewhat anemic.  This is stable for further follow-up.  I advise she must work with her doctor she may need endotoscopy or other diagnostic evaluation for anemia of chronic disease.  At this time stable for discharge.  Patient is normotensive, no signs of endorgan damage, no chest pain, no shortness of breath.  We reviewed the plan of initiating Prilosec which would be helpful with gastritis and for potential reflux and epigastric discomfort especially with eating and bloating.  Return precautions reviewed.  We also reviewed the necessity for ongoing monitoring of the incidentally identified kidney stone.        Final Clinical Impression(s) / ED Diagnoses Final diagnoses:  Abdominal distention  Kidney stone on left side  Anemia, unspecified type    Rx / DC Orders ED Discharge Orders          Ordered    omeprazole (PRILOSEC) 20 MG capsule  Daily        10/03/22 0011              Charlesetta Shanks, MD 10/03/22 0019

## 2022-10-02 NOTE — ED Triage Notes (Signed)
BIBA Per EMS: Pt coming from home w/ c/o constipation. Abd pain. Denies any medications. States PCP sent to ED.  VSS

## 2022-10-03 MED ORDER — PANTOPRAZOLE SODIUM 20 MG PO TBEC
20.0000 mg | DELAYED_RELEASE_TABLET | Freq: Once | ORAL | Status: AC
  Administered 2022-10-03: 20 mg via ORAL
  Filled 2022-10-03: qty 1

## 2022-10-03 MED ORDER — OMEPRAZOLE 20 MG PO CPDR: 20.0000 mg | DELAYED_RELEASE_CAPSULE | Freq: Every day | ORAL | 0 refills | Status: DC

## 2022-10-03 NOTE — Discharge Instructions (Addendum)
1.  You have low blood count.  Hemoglobin is 7.7.  Is very important that you follow-up with your doctor and monitor this.  You may need a colonoscopy.  At this time there is no obvious bleeding but your stool does test positive for blood although it does not look dark or bloody at this time.  Sometimes blood loss is very gradual and sometimes your body does not produce enough blood cells.  See your doctor to continue working on the cause of your anemia. 2.  Start taking Prilosec daily as prescribed.  This can help protect your stomach from excess acid secretion which can lead to bleeding and also may help with bloating you are getting in your upper abdomen. 3.  Your CT scan did not show any bowel blockages.  However it did identify some kidney stones on the left.  At this time they do not seem to be giving you pain.  You however need to follow-up and keep monitoring these.  They may need to be removed if they are causing any blockages preventing the kidney from draining. 4.  Return to the emergency department if you are getting new or worsening symptoms, shortness of breath, chest pain, general weakness or other concerning changes.

## 2022-11-11 ENCOUNTER — Other Ambulatory Visit: Payer: Self-pay | Admitting: Family Medicine

## 2022-11-11 DIAGNOSIS — I10 Essential (primary) hypertension: Secondary | ICD-10-CM

## 2022-11-13 ENCOUNTER — Other Ambulatory Visit: Payer: Self-pay | Admitting: Family Medicine

## 2022-11-13 DIAGNOSIS — I10 Essential (primary) hypertension: Secondary | ICD-10-CM

## 2022-11-14 ENCOUNTER — Inpatient Hospital Stay (HOSPITAL_COMMUNITY): Payer: Medicare HMO

## 2022-11-14 ENCOUNTER — Emergency Department (HOSPITAL_COMMUNITY): Payer: Medicare HMO

## 2022-11-14 ENCOUNTER — Inpatient Hospital Stay (HOSPITAL_COMMUNITY)
Admission: EM | Admit: 2022-11-14 | Discharge: 2022-12-06 | DRG: 330 | Disposition: A | Discharge status: Home Health | Payer: Medicare HMO | Attending: Internal Medicine | Admitting: Internal Medicine

## 2022-11-14 DIAGNOSIS — N939 Abnormal uterine and vaginal bleeding, unspecified: Secondary | ICD-10-CM | POA: Diagnosis not present

## 2022-11-14 DIAGNOSIS — N3000 Acute cystitis without hematuria: Secondary | ICD-10-CM | POA: Diagnosis present

## 2022-11-14 DIAGNOSIS — Z833 Family history of diabetes mellitus: Secondary | ICD-10-CM

## 2022-11-14 DIAGNOSIS — D696 Thrombocytopenia, unspecified: Secondary | ICD-10-CM | POA: Diagnosis present

## 2022-11-14 DIAGNOSIS — K5900 Constipation, unspecified: Secondary | ICD-10-CM

## 2022-11-14 DIAGNOSIS — N201 Calculus of ureter: Secondary | ICD-10-CM | POA: Diagnosis present

## 2022-11-14 DIAGNOSIS — G8220 Paraplegia, unspecified: Secondary | ICD-10-CM | POA: Diagnosis present

## 2022-11-14 DIAGNOSIS — D72829 Elevated white blood cell count, unspecified: Secondary | ICD-10-CM | POA: Diagnosis present

## 2022-11-14 DIAGNOSIS — K633 Ulcer of intestine: Secondary | ICD-10-CM | POA: Diagnosis present

## 2022-11-14 DIAGNOSIS — Z933 Colostomy status: Principal | ICD-10-CM

## 2022-11-14 DIAGNOSIS — Z9049 Acquired absence of other specified parts of digestive tract: Secondary | ICD-10-CM

## 2022-11-14 DIAGNOSIS — K592 Neurogenic bowel, not elsewhere classified: Secondary | ICD-10-CM | POA: Diagnosis present

## 2022-11-14 DIAGNOSIS — J45909 Unspecified asthma, uncomplicated: Secondary | ICD-10-CM | POA: Diagnosis not present

## 2022-11-14 DIAGNOSIS — L89629 Pressure ulcer of left heel, unspecified stage: Secondary | ICD-10-CM | POA: Diagnosis present

## 2022-11-14 DIAGNOSIS — D500 Iron deficiency anemia secondary to blood loss (chronic): Secondary | ICD-10-CM | POA: Diagnosis present

## 2022-11-14 DIAGNOSIS — K567 Ileus, unspecified: Secondary | ICD-10-CM | POA: Diagnosis not present

## 2022-11-14 DIAGNOSIS — R21 Rash and other nonspecific skin eruption: Secondary | ICD-10-CM | POA: Diagnosis present

## 2022-11-14 DIAGNOSIS — D72823 Leukemoid reaction: Secondary | ICD-10-CM | POA: Diagnosis not present

## 2022-11-14 DIAGNOSIS — Z79899 Other long term (current) drug therapy: Secondary | ICD-10-CM | POA: Diagnosis not present

## 2022-11-14 DIAGNOSIS — D649 Anemia, unspecified: Secondary | ICD-10-CM | POA: Diagnosis not present

## 2022-11-14 DIAGNOSIS — C189 Malignant neoplasm of colon, unspecified: Secondary | ICD-10-CM | POA: Diagnosis present

## 2022-11-14 DIAGNOSIS — L519 Erythema multiforme, unspecified: Secondary | ICD-10-CM | POA: Diagnosis present

## 2022-11-14 DIAGNOSIS — K660 Peritoneal adhesions (postprocedural) (postinfection): Secondary | ICD-10-CM | POA: Diagnosis present

## 2022-11-14 DIAGNOSIS — Z803 Family history of malignant neoplasm of breast: Secondary | ICD-10-CM

## 2022-11-14 DIAGNOSIS — Z6841 Body Mass Index (BMI) 40.0 and over, adult: Secondary | ICD-10-CM | POA: Diagnosis not present

## 2022-11-14 DIAGNOSIS — Z8249 Family history of ischemic heart disease and other diseases of the circulatory system: Secondary | ICD-10-CM

## 2022-11-14 DIAGNOSIS — E8809 Other disorders of plasma-protein metabolism, not elsewhere classified: Secondary | ICD-10-CM | POA: Diagnosis present

## 2022-11-14 DIAGNOSIS — D509 Iron deficiency anemia, unspecified: Secondary | ICD-10-CM | POA: Diagnosis not present

## 2022-11-14 DIAGNOSIS — L270 Generalized skin eruption due to drugs and medicaments taken internally: Secondary | ICD-10-CM | POA: Diagnosis present

## 2022-11-14 DIAGNOSIS — K219 Gastro-esophageal reflux disease without esophagitis: Secondary | ICD-10-CM | POA: Diagnosis present

## 2022-11-14 DIAGNOSIS — K5903 Drug induced constipation: Secondary | ICD-10-CM | POA: Diagnosis present

## 2022-11-14 DIAGNOSIS — K6389 Other specified diseases of intestine: Secondary | ICD-10-CM | POA: Diagnosis not present

## 2022-11-14 DIAGNOSIS — C183 Malignant neoplasm of hepatic flexure: Principal | ICD-10-CM | POA: Diagnosis present

## 2022-11-14 DIAGNOSIS — E876 Hypokalemia: Secondary | ICD-10-CM

## 2022-11-14 DIAGNOSIS — Z87891 Personal history of nicotine dependence: Secondary | ICD-10-CM

## 2022-11-14 DIAGNOSIS — L89619 Pressure ulcer of right heel, unspecified stage: Secondary | ICD-10-CM | POA: Diagnosis present

## 2022-11-14 DIAGNOSIS — N3001 Acute cystitis with hematuria: Secondary | ICD-10-CM | POA: Diagnosis not present

## 2022-11-14 DIAGNOSIS — L0390 Cellulitis, unspecified: Secondary | ICD-10-CM | POA: Diagnosis present

## 2022-11-14 DIAGNOSIS — Z7401 Bed confinement status: Secondary | ICD-10-CM

## 2022-11-14 DIAGNOSIS — K5939 Other megacolon: Secondary | ICD-10-CM | POA: Diagnosis present

## 2022-11-14 DIAGNOSIS — N319 Neuromuscular dysfunction of bladder, unspecified: Secondary | ICD-10-CM | POA: Diagnosis present

## 2022-11-14 DIAGNOSIS — D490 Neoplasm of unspecified behavior of digestive system: Secondary | ICD-10-CM | POA: Diagnosis not present

## 2022-11-14 DIAGNOSIS — I10 Essential (primary) hypertension: Secondary | ICD-10-CM | POA: Diagnosis not present

## 2022-11-14 DIAGNOSIS — B952 Enterococcus as the cause of diseases classified elsewhere: Secondary | ICD-10-CM | POA: Diagnosis present

## 2022-11-14 DIAGNOSIS — Z98891 History of uterine scar from previous surgery: Secondary | ICD-10-CM

## 2022-11-14 HISTORY — DX: Constipation, unspecified: K59.00

## 2022-11-14 LAB — IRON AND TIBC
Iron: 12 ug/dL — ABNORMAL LOW (ref 28–170)
Saturation Ratios: 3 % — ABNORMAL LOW (ref 10.4–31.8)
TIBC: 426 ug/dL (ref 250–450)
UIBC: 414 ug/dL

## 2022-11-14 LAB — CBC WITH DIFFERENTIAL/PLATELET
Abs Immature Granulocytes: 0.16 10*3/uL — ABNORMAL HIGH (ref 0.00–0.07)
Basophils Absolute: 0 10*3/uL (ref 0.0–0.1)
Basophils Relative: 0 %
Eosinophils Absolute: 1.2 10*3/uL — ABNORMAL HIGH (ref 0.0–0.5)
Eosinophils Relative: 7 %
HCT: 25.8 % — ABNORMAL LOW (ref 36.0–46.0)
Hemoglobin: 6.8 g/dL — CL (ref 12.0–15.0)
Immature Granulocytes: 1 %
Lymphocytes Relative: 9 %
Lymphs Abs: 1.5 10*3/uL (ref 0.7–4.0)
MCH: 16.5 pg — ABNORMAL LOW (ref 26.0–34.0)
MCHC: 26.4 g/dL — ABNORMAL LOW (ref 30.0–36.0)
MCV: 62.8 fL — ABNORMAL LOW (ref 80.0–100.0)
Monocytes Absolute: 1 10*3/uL (ref 0.1–1.0)
Monocytes Relative: 6 %
Neutro Abs: 12.9 10*3/uL — ABNORMAL HIGH (ref 1.7–7.7)
Neutrophils Relative %: 77 %
Platelets: 344 10*3/uL (ref 150–400)
RBC: 4.11 MIL/uL (ref 3.87–5.11)
RDW: 22.9 % — ABNORMAL HIGH (ref 11.5–15.5)
WBC: 16.8 10*3/uL — ABNORMAL HIGH (ref 4.0–10.5)
nRBC: 0.2 % (ref 0.0–0.2)

## 2022-11-14 LAB — COMPREHENSIVE METABOLIC PANEL
ALT: 15 U/L (ref 0–44)
AST: 15 U/L (ref 15–41)
Albumin: 3.2 g/dL — ABNORMAL LOW (ref 3.5–5.0)
Alkaline Phosphatase: 60 U/L (ref 38–126)
Anion gap: 6 (ref 5–15)
BUN: 25 mg/dL — ABNORMAL HIGH (ref 6–20)
CO2: 25 mmol/L (ref 22–32)
Calcium: 8.3 mg/dL — ABNORMAL LOW (ref 8.9–10.3)
Chloride: 110 mmol/L (ref 98–111)
Creatinine, Ser: 0.78 mg/dL (ref 0.44–1.00)
GFR, Estimated: >60 mL/min (ref >60)
Glucose, Bld: 148 mg/dL — ABNORMAL HIGH (ref 70–99)
Potassium: 3.3 mmol/L — ABNORMAL LOW (ref 3.5–5.1)
Sodium: 141 mmol/L (ref 135–145)
Total Bilirubin: 0.4 mg/dL (ref 0.3–1.2)
Total Protein: 6.6 g/dL (ref 6.5–8.1)

## 2022-11-14 LAB — OCCULT BLOOD X 1 CARD TO LAB, STOOL: Fecal Occult Bld: NEGATIVE

## 2022-11-14 LAB — APTT: aPTT: 25 seconds (ref 24–36)

## 2022-11-14 LAB — FIBRINOGEN: Fibrinogen: 294 mg/dL (ref 210–475)

## 2022-11-14 LAB — URINALYSIS, ROUTINE W REFLEX MICROSCOPIC
Bilirubin Urine: NEGATIVE
Glucose, UA: NEGATIVE mg/dL
Ketones, ur: NEGATIVE mg/dL
Leukocytes,Ua: NEGATIVE
Nitrite: NEGATIVE
Protein, ur: NEGATIVE mg/dL
RBC / HPF: >50 RBC/hpf — ABNORMAL HIGH (ref 0–5)
Specific Gravity, Urine: 1.011 (ref 1.005–1.030)
pH: 7 (ref 5.0–8.0)

## 2022-11-14 LAB — TROPONIN I (HIGH SENSITIVITY)
Troponin I (High Sensitivity): 5 ng/L (ref <18)
Troponin I (High Sensitivity): 3 ng/L (ref <18)

## 2022-11-14 LAB — PREPARE RBC (CROSSMATCH)

## 2022-11-14 LAB — VITAMIN B12: Vitamin B-12: 240 pg/mL (ref 180–914)

## 2022-11-14 LAB — PHOSPHORUS: Phosphorus: 2.8 mg/dL (ref 2.5–4.6)

## 2022-11-14 LAB — FERRITIN: Ferritin: 3 ng/mL — ABNORMAL LOW (ref 11–307)

## 2022-11-14 LAB — RETICULOCYTES
Immature Retic Fract: 22.1 % — ABNORMAL HIGH (ref 2.3–15.9)
RBC.: 4.05 MIL/uL (ref 3.87–5.11)
Retic Count, Absolute: 76.9 10*3/uL (ref 19.0–186.0)
Retic Ct Pct: 1.9 % (ref 0.4–3.1)

## 2022-11-14 LAB — MAGNESIUM: Magnesium: 2.1 mg/dL (ref 1.7–2.4)

## 2022-11-14 LAB — LACTIC ACID, PLASMA
Lactic Acid, Venous: 1.7 mmol/L (ref 0.5–1.9)
Lactic Acid, Venous: 2.1 mmol/L (ref 0.5–1.9)

## 2022-11-14 LAB — PROTIME-INR
INR: 1.2 (ref 0.8–1.2)
Prothrombin Time: 15.5 seconds — ABNORMAL HIGH (ref 11.4–15.2)

## 2022-11-14 LAB — SEDIMENTATION RATE: Sed Rate: 9 mm/hr (ref 0–22)

## 2022-11-14 LAB — PROCALCITONIN: Procalcitonin: <0.1 ng/mL

## 2022-11-14 LAB — RAPID HIV SCREEN (HIV 1/2 AB+AG)
HIV 1/2 Antibodies: NONREACTIVE
HIV-1 P24 Antigen - HIV24: NONREACTIVE

## 2022-11-14 LAB — HCG, SERUM, QUALITATIVE: Preg, Serum: NEGATIVE

## 2022-11-14 LAB — TSH: TSH: 1.41 u[IU]/mL (ref 0.350–4.500)

## 2022-11-14 LAB — FOLATE: Folate: 4.6 ng/mL — ABNORMAL LOW (ref >5.9)

## 2022-11-14 LAB — ABO/RH: ABO/RH(D): A POS

## 2022-11-14 MED ORDER — FAMOTIDINE IN NACL 20-0.9 MG/50ML-% IV SOLN
20.0000 mg | Freq: Two times a day (BID) | INTRAVENOUS | Status: DC
  Administered 2022-11-15 – 2022-11-27 (×23): 20 mg via INTRAVENOUS
  Filled 2022-11-14 (×23): qty 50

## 2022-11-14 MED ORDER — SODIUM CHLORIDE 0.9 % IV BOLUS
1000.0000 mL | Freq: Once | INTRAVENOUS | Status: AC
  Administered 2022-11-14: 1000 mL via INTRAVENOUS

## 2022-11-14 MED ORDER — POTASSIUM CHLORIDE 10 MEQ/100ML IV SOLN
10.0000 meq | INTRAVENOUS | Status: AC
  Administered 2022-11-14 – 2022-11-15 (×4): 10 meq via INTRAVENOUS
  Filled 2022-11-14 (×4): qty 100

## 2022-11-14 MED ORDER — DIPHENHYDRAMINE HCL 50 MG/ML IJ SOLN
25.0000 mg | Freq: Four times a day (QID) | INTRAMUSCULAR | Status: DC | PRN
  Administered 2022-11-15 (×2): 25 mg via INTRAVENOUS
  Filled 2022-11-14 (×2): qty 1

## 2022-11-14 MED ORDER — DOCUSATE SODIUM 100 MG PO CAPS
100.0000 mg | ORAL_CAPSULE | Freq: Two times a day (BID) | ORAL | Status: DC
  Administered 2022-11-14 – 2022-11-21 (×14): 100 mg via ORAL
  Filled 2022-11-14 (×14): qty 1

## 2022-11-14 MED ORDER — NYSTATIN 100000 UNIT/GM EX POWD
Freq: Two times a day (BID) | CUTANEOUS | Status: DC
  Filled 2022-11-14 (×2): qty 15

## 2022-11-14 MED ORDER — LACTATED RINGERS IV BOLUS: 1000.0000 mL | Freq: Once | INTRAVENOUS | Status: DC

## 2022-11-14 MED ORDER — HYDROCODONE-ACETAMINOPHEN 5-325 MG PO TABS
1.0000 | ORAL_TABLET | ORAL | Status: DC | PRN
  Administered 2022-11-16: 1 via ORAL
  Administered 2022-11-17 – 2022-11-20 (×3): 2 via ORAL
  Filled 2022-11-14 (×4): qty 2
  Filled 2022-11-14: qty 1

## 2022-11-14 MED ORDER — FAMOTIDINE IN NACL 20-0.9 MG/50ML-% IV SOLN
20.0000 mg | Freq: Once | INTRAVENOUS | Status: AC
  Administered 2022-11-14: 20 mg via INTRAVENOUS
  Filled 2022-11-14: qty 50

## 2022-11-14 MED ORDER — ACETAMINOPHEN 325 MG PO TABS: 650.0000 mg | ORAL_TABLET | Freq: Four times a day (QID) | ORAL | Status: DC | PRN

## 2022-11-14 MED ORDER — ACETAMINOPHEN 650 MG RE SUPP: 650.0000 mg | Freq: Four times a day (QID) | RECTAL | Status: DC | PRN

## 2022-11-14 MED ORDER — ONDANSETRON HCL 4 MG/2ML IJ SOLN
4.0000 mg | Freq: Four times a day (QID) | INTRAMUSCULAR | Status: DC | PRN
  Administered 2022-11-22 – 2022-11-25 (×2): 4 mg via INTRAVENOUS
  Filled 2022-11-14: qty 2

## 2022-11-14 MED ORDER — SODIUM CHLORIDE 0.9 % IV SOLN: INTRAVENOUS | Status: AC

## 2022-11-14 MED ORDER — SODIUM CHLORIDE 0.9 % IV SOLN
1.0000 g | Freq: Once | INTRAVENOUS | Status: AC
  Administered 2022-11-14: 1 g via INTRAVENOUS
  Filled 2022-11-14: qty 10

## 2022-11-14 MED ORDER — DIPHENHYDRAMINE HCL 50 MG/ML IJ SOLN
25.0000 mg | Freq: Once | INTRAMUSCULAR | Status: AC
  Administered 2022-11-14: 25 mg via INTRAVENOUS
  Filled 2022-11-14: qty 1

## 2022-11-14 MED ORDER — ONDANSETRON HCL 4 MG PO TABS: 4.0000 mg | ORAL_TABLET | Freq: Four times a day (QID) | ORAL | Status: DC | PRN

## 2022-11-14 MED ORDER — POLYETHYLENE GLYCOL 3350 17 G PO PACK: 17.0000 g | PACK | Freq: Every day | ORAL | Status: DC | PRN

## 2022-11-14 MED ORDER — SODIUM CHLORIDE 0.9 % IV SOLN
10.0000 mL/h | Freq: Once | INTRAVENOUS | Status: AC
  Administered 2022-11-14: 10 mL/h via INTRAVENOUS

## 2022-11-14 MED ORDER — BISACODYL 10 MG RE SUPP: 10.0000 mg | Freq: Every day | RECTAL | Status: DC | PRN

## 2022-11-14 MED ORDER — SENNA 8.6 MG PO TABS
1.0000 | ORAL_TABLET | Freq: Two times a day (BID) | ORAL | Status: DC
  Administered 2022-11-14 – 2022-11-21 (×14): 8.6 mg via ORAL
  Filled 2022-11-14 (×14): qty 1

## 2022-11-14 NOTE — ED Notes (Signed)
Pt reports she is bed bound d/t a back surgery.  Family reports they have been trying to get Pt into Rehab "for 2 years."  Sts she has home health "for 2hrs per day when she decides to show up."

## 2022-11-14 NOTE — Subjective & Objective (Signed)
About 2 weeks ago patient developed a rash on the front of her body started as hives but eventually developed blisters and itching.  There is some redness patient has been started on doxycycline and also was previously treated with steroids No associated oral or other mucosal involvement no shortness of breath no wheezing No movement of the palms of the hands or feet No involvement of posterior body Patient has extensive mobility issues secondary to laminectomy resulting in diminished mobility neurogenic bladder and bowel. She has chronic iron deficiency anemia with intermittently Hemoccult positive stools followed by gastroenterology at [facility], Javier Glazier, PA-C   Significant drug-induced constipation On 8 November patient started on Bentyl 20 mg tablet 4 times a day

## 2022-11-14 NOTE — ED Notes (Signed)
IV team at bedside 

## 2022-11-14 NOTE — ED Triage Notes (Signed)
Per EMS, Pt, from home, c/o allergic reaction x almost 2 weeks.  Pt reports hives, blisters, and itching started after taking Doxy (?) x 2 days.    Denies SOB and blisters/oral swelling.

## 2022-11-14 NOTE — Assessment & Plan Note (Addendum)
Differential includes allergic reaction to medication versus infection versus autoimmune disorder. Ongoing for the past 2 weeks Worse with steroids Although redness present no warmth no fever to suggest cellulitis discussed with ID at this point recommend holding off on further antibiotics Continue Benadryl and Pepcid for now No oral involvement Not typical for Stevens-Johnson presentation at this time but needs to be further followed Would benefit from dermatology evaluation if available appreciate ID consult Sed rate unremarkable Order mycoplasma HIV RPR Hold Bentyl and other home medications for now as it is unclear what patient reacted to

## 2022-11-14 NOTE — Assessment & Plan Note (Signed)
Replace and check magnesium level replace electrolytes as needed

## 2022-11-14 NOTE — Assessment & Plan Note (Signed)
In the setting of recent steroid use

## 2022-11-14 NOTE — Assessment & Plan Note (Signed)
He has a follow-up as an outpatient for obesity management

## 2022-11-14 NOTE — H&P (Addendum)
[name] IRJ:[id number] DOB: [birth year] DOA: 11/14/2022     PCP: [facility]   Outpatient Specialists:     [facility]  Patient arrived to ER on 11/14/22 at 1331 Referred by Attending Fransico Meadow, MD   Patient coming from:    home Lives With family    Chief Complaint:   Chief Complaint  Patient presents with   Allergic Reaction    HPI: Ellen Hill is a 51 y.o. female with medical history significant of paraplegia as a result of laminectomy, chronic anemia, morbid obesity, hypertension, constipation,    Presented with   rash About 2 weeks ago patient developed a rash on the front of her body started as hives but eventually developed blisters and itching.  There is some redness patient has been started on doxycycline and also was previously treated with steroids No associated oral or other mucosal involvement no shortness of breath no wheezing No movement of the palms of the hands or feet No involvement of posterior body Patient has extensive mobility issues secondary to laminectomy resulting in diminished mobility neurogenic bladder and bowel. She has chronic iron deficiency anemia with intermittently Hemoccult positive stools followed by gastroenterology at [place], Javier Glazier, PA-C   Significant drug-induced constipation On 8 November patient started on Bentyl 20 mg tablet 4 times a day   Reports rash started on legs and progressed 2 days after starting Bentyl, using Steroid only made it worse the rash continued to spread, it burns and itches, no fever  Reports no BM fo rthe past 3 days  Does not smoke or drink etoh  No melena no blood in stool  Patient has been followed by GI for her chronic anemia intermittent Hemoccult positive stools but secondary to her significant obesity the plan was for her to eventually undergo endoscopy in [place] unfortunately patient developed a rash and was not able to undergo  procedure  Regarding pertinent Chronic problems:       HTN on HCTZ   Chronic anemia - baseline hg Hemoglobin & Hematocrit  Recent Labs    10/02/22 2133 11/14/22 1456  HGB 7.7* 6.8*    While in ER: Clinical Course as of 11/14/22 1900  Thu Nov 14, 2022  1530 Patient persistently tachycardic with rate of 119, leukocytosis of 16.8.  Although afebrile meet SIRS criteria we will check lactic and culture. [HS]  1735 Ultrasound Guided Peripheral IV Indication: difficult to access - nursing staff unable to secure adequate peripheral IV access Location: R Antecubital Catheter Size: 18 G  Static views used to identify the target vein then usual prep with Chloraprep. IV placed on first attempt under dynamic US guidance. Dark red flash noted, and catheter advanced smoothly into the vein. NS saline flushed without resistance, witnessed swelling, or patient discomfort. IV secured with I-site and tegaderm. The patient tolerated the procedure well. Performed By: Margaretmary Eddy MD  [RP]  [phone] Spoke with [facility] they do not have any beds were not excepting transfers for admission.  I did consult with dermatology and spoke with resident Dr. Zacarias Pontes, unable to give significant directions without actually examining the patient.  Agrees with fluids, antibiotics [HS]  0109 DG Chest Portable 1 View Unable to fully visualize left lower lobe, patient has not been coughing, no fevers and denies any viral URI symptoms.  Doubt pneumonia. [HS]  3235 I spoke with [facility] transfer line, they are at capacity and declined patient. Will reach  out to [facility] [HS]  [place] with [facility], they are not accepting admissions and wait list is full. [HS]    Clinical Course User Index [HS] Sherrill Raring, PA-C [RP] Fransico Meadow, MD      CXR - No active disease; however, left costophrenic angle and left base/hemidiaphragm collimated off view. Cannot exclude consolidation at the left base.    Following  Medications were ordered in ER: Medications  cefTRIAXone (ROCEPHIN) 1 g in sodium chloride 0.9 % 100 mL IVPB (1 g Intravenous New Bag/Given 11/14/22 1858)  diphenhydrAMINE (BENADRYL) injection 25 mg (25 mg Intravenous Given 11/14/22 1820)  famotidine (PEPCID) IVPB 20 mg premix (0 mg Intravenous Stopped 11/14/22 1856)  sodium chloride 0.9 % bolus 1,000 mL (1,000 mLs Intravenous New Bag/Given 11/14/22 1824)  0.9 %  sodium chloride infusion (10 mL/hr Intravenous New Bag/Given 11/14/22 1820)       ED Triage Vitals  Enc Vitals Group     BP 11/14/22 1339 131/81     Pulse Rate 11/14/22 1347 (!) 134     Resp 11/14/22 1344 (!) 21     Temp 11/14/22 1347 98.1 F (36.7 C)     Temp src --      SpO2 11/14/22 1342 99 %     Weight 11/14/22 1346 (!) 360 lb (163.3 kg)     Height 11/14/22 1346 '5\' 6"'$  (1.676 m)     Head Circumference --      Peak Flow --      Pain Score 11/14/22 1345 3     Pain Loc --      Pain Edu? --      Excl. in [place]? --   TMAX(24)@     _________________________________________ Significant initial  Findings: Abnormal Labs Reviewed  CBC WITH DIFFERENTIAL/PLATELET - Abnormal; Notable for the following components:      Result Value   WBC 16.8 (*)    Hemoglobin 6.8 (*)    HCT 25.8 (*)    MCV 62.8 (*)    MCH 16.5 (*)    MCHC 26.4 (*)    RDW 22.9 (*)    All other components within normal limits  COMPREHENSIVE METABOLIC PANEL - Abnormal; Notable for the following components:   Potassium 3.3 (*)    Glucose, Bld 148 (*)    BUN 25 (*)    Calcium 8.3 (*)    Albumin 3.2 (*)    All other components within normal limits    ECG: Ordered Personally reviewed and interpreted by me showing: HR : 130 Rhythm:  Sinus tachycardia  Ventricular premature complex Aberrant complex Low voltage, precordial leads Borderline ST depression, diffuse leads Abnormal T, consider ischemia, diffuse leads no prior for comparison QTC 449   ____________________ This patient meets SIRS Criteria and  may be septic.    The recent clinical data is shown below. Vitals:   11/14/22 1630 11/14/22 1705 11/14/22 1742 11/14/22 1800  BP:  (!) 152/80  135/66  Pulse: (!) 106 100  88  Resp: '19 16  17  '$ Temp:   98.1 F (36.7 C)   SpO2: 98% 98%  100%  Weight:      Height:        WBC     Component Value Date/Time   WBC 16.8 (H) 11/14/2022 1456   LYMPHSABS PENDING 11/14/2022 1456   MONOABS PENDING 11/14/2022 1456   EOSABS PENDING 11/14/2022 1456   BASOSABS PENDING 11/14/2022 1456      Procalcitonin   Ordered Lactic  Acid, Venous    Component Value Date/Time   LATICACIDVEN 1.7 11/14/2022 1730     UA  ordered   Urine analysis:    Component Value Date/Time   COLORURINE YELLOW 10/02/2022 2154   APPEARANCEUR CLEAR 10/02/2022 2154   LABSPEC 1.017 10/02/2022 2154   PHURINE 6.0 10/02/2022 2154   GLUCOSEU NEGATIVE 10/02/2022 2154   HGBUR SMALL (A) 10/02/2022 2154   BILIRUBINUR NEGATIVE 10/02/2022 2154   BILIRUBINUR Negative 04/13/2012 0957   KETONESUR NEGATIVE 10/02/2022 2154   PROTEINUR NEGATIVE 10/02/2022 2154   UROBILINOGEN 1.0 12/20/2012 1806   NITRITE NEGATIVE 10/02/2022 2154   LEUKOCYTESUR NEGATIVE 10/02/2022 2154    Results for orders placed or performed during the hospital encounter of 11/14/22  Blood culture (routine x 2)     Status: None (Preliminary result)   Collection Time: 11/14/22  5:30 PM   Specimen: BLOOD RIGHT FOREARM  Result Value Ref Range Status   Specimen Description   Final    BLOOD RIGHT FOREARM Performed at [facility] [address], [place], [place] [address]    Special Requests   Final    BOTTLES DRAWN AEROBIC AND ANAEROBIC Blood Culture adequate volume Performed at [facility] [address] [place], [place] [address]    Culture PENDING  Incomplete   Report Status PENDING  Incomplete     _______________________________________________ Hospitalist was called for admission for   Cellulitis, unspecified cellulitis  site  Symptomatic anemia     The following Work up has been ordered so far:  Orders Placed This Encounter  Procedures   Critical Care   Blood culture (routine x 2)   DG Chest Portable 1 View   CBC with Differential   Comprehensive metabolic panel   Sedimentation rate   hCG, serum, qualitative   Lactic acid, plasma   Occult blood card to lab, stool   RPR   Rapid HIV screen (HIV 1/2 Ab+Ag)   Cardiac monitoring   Initiate Carrier Fluid Protocol   Initiate Carrier Fluid Protocol   Informed Consent Details: Physician/Practitioner Attestation; Transcribe to consent form and obtain patient signature   Consult to hospitalist   Pulse oximetry, continuous   EKG 12-Lead   Type and screen [place]   Prepare RBC (crossmatch)   ABO/Rh   Insert peripheral IV   Insert peripheral IV     OTHER Significant initial  Findings:  labs showing:     Recent Labs  Lab 11/14/22 1456  NA 141  K 3.3*  CO2 25  GLUCOSE 148*  BUN 25*  CREATININE 0.78  CALCIUM 8.3*    Cr  stable,  Lab Results  Component Value Date   CREATININE 0.78 11/14/2022   CREATININE 0.78 10/02/2022   CREATININE 0.64 03/01/2019    Recent Labs  Lab 11/14/22 1456  AST 15  ALT 15  ALKPHOS 60  BILITOT 0.4  PROT 6.6  ALBUMIN 3.2*   Lab Results  Component Value Date   CALCIUM 8.3 (L) 11/14/2022    Plt: Lab Results  Component Value Date   PLT 344 11/14/2022    Recent Labs  Lab 11/14/22 1456  WBC 16.8*  NEUTROABS PENDING  HGB 6.8*  HCT 25.8*  MCV 62.8*  PLT 344    HG/HCT   Down  from baseline see below    Component Value Date/Time   HGB 6.8 (LL) 11/14/2022 1456   HCT 25.8 (L) 11/14/2022 1456   MCV 62.8 (L) 11/14/2022 1456  Cardiac Panel (last 3 results) No results for input(s): "CKTOTAL", "CKMB", "TROPONINI", "RELINDX" in the last 72 hours.  .car BNP (last 3 results) No results for input(s): "BNP" in the last 8760 hours.    DM  labs:  HbA1C: No results for  input(s): "HGBA1C" in the last 8760 hours.     CBG (last 3)  No results for input(s): "GLUCAP" in the last 72 hours.        Cultures:    Component Value Date/Time   SDES  11/14/2022 1730    BLOOD RIGHT FOREARM Performed at [facility], [place] [address] [place], [place] [address]    SPECREQUEST  11/14/2022 1730    BOTTLES DRAWN AEROBIC AND ANAEROBIC Blood Culture adequate volume Performed at [facility], [place] [address], [place], [place] [address]    CULT PENDING 11/14/2022 1730   REPTSTATUS PENDING 11/14/2022 1730     Radiological Exams on Admission: DG Chest Portable 1 View  Result Date: 11/14/2022 CLINICAL DATA:  weakness EXAM: PORTABLE CHEST 1 VIEW COMPARISON:  Chest x-ray 05/20/2011 FINDINGS: The heart and mediastinal contours are within normal limits. Left costophrenic angle and left base/hemidiaphragm collimated off view. No focal consolidation. No pulmonary edema. No pleural effusion. No pneumothorax. No acute osseous abnormality. IMPRESSION: No active disease; however, left costophrenic angle and left base/hemidiaphragm collimated off view. Cannot exclude consolidation at the left base. Recommend repeat PA and lateral view of the chest. Electronically Signed   By: Iven Finn M.D.   On: 11/14/2022 17:07   _______________________________________________________________________________________________________ Latest  Blood pressure 135/66, pulse 88, temperature 98.1 F (36.7 C), resp. rate 17, height '5\' 6"'$  (1.676 m), weight (!) 163.3 kg, SpO2 100 %.   Vitals  labs and radiology finding personally reviewed  Review of Systems:    Pertinent positives include:  fatigue,rash or lesions.  Constitutional:  No weight loss, night sweats, Fevers, chills,  weight loss  HEENT:  No headaches, Difficulty swallowing,Tooth/dental problems,Sore throat,  No sneezing, itching, ear ache, nasal congestion, post nasal drip,  Cardio-vascular:  No chest pain,  Orthopnea, PND, anasarca, dizziness, palpitations.no Bilateral lower extremity swelling  GI:  No heartburn, indigestion, abdominal pain, nausea, vomiting, diarrhea, change in bowel habits, loss of appetite, melena, blood in stool, hematemesis Resp:  no shortness of breath at rest. No dyspnea on exertion, No excess mucus, no productive cough, No non-productive cough, No coughing up of blood.No change in color of mucus.No wheezing. Skin:  no  No jaundice GU:  no dysuria, change in color of urine, no urgency or frequency. No straining to urinate.  No flank pain.  Musculoskeletal:  No joint pain or no joint swelling. No decreased range of motion. No back pain.  Psych:  No change in mood or affect. No depression or anxiety. No memory loss.  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 after loss of parents, is fine   Vaginal Pap smear, abnormal    f/u wnl      Past Surgical History:  Procedure Laterality Date   CERVICAL POLYPECTOMY  05/01/2012   Procedure: CERVICAL POLYPECTOMY;  Surgeon:  Betsy Coder, MD;  Location: [facility];  Service: Gynecology;  Laterality: N/A;   CESAREAN SECTION  07/04/2007   CHOLECYSTECTOMY  07/06/2013   CHOLECYSTECTOMY N/A 07/06/2013   Procedure: LAPAROSCOPIC CHOLECYSTECTOMY ;  Surgeon: Gayland Curry, MD;  Location: [facility];  Service: General;  Laterality: N/A;  attempted cholangiogram   fibroidecto     fibroidectomy  07/04/2007   LUMBAR LAMINECTOMY/DECOMPRESSION MICRODISCECTOMY N/A 01/19/2019   Procedure: Thoracic Nine-Lumbar One Decompressive Laminectomy;  Surgeon: Earnie Larsson, MD;  Location: [facility];  Service: Neurosurgery;  Laterality: N/A;  posterior   MYOMECTOMY  07/04/08   OPERATIVE HYSTEROSCOPY  05/01/2012   no menses since that time   TUBAL LIGATION  07/04/2007    Social History:  Ambulatory   bed bound     reports that she quit smoking about 23 years ago. Her smoking use included cigarettes. She has a 0.30 pack-year smoking history. She has never used smokeless tobacco. She reports that she does not drink alcohol and does not use drugs.     Family History:   Family History  Problem Relation Age of Onset   Hypertension Mother    Cancer Mother        Breast   Breast cancer Mother    Hypertension Father    Cancer Father        Prostate   Heart attack Father    Hypertension Sister    Diabetes Maternal Aunt    ______________________________________________________________________________________________ Allergies: No Known Allergies   Prior to Admission medications   Medication Sig Start Date End Date Taking? Authorizing Provider  baclofen (LIORESAL) 10 MG tablet TAKE 1 TABLET TWICE A DAY AT 8 AM AND 1 PM WITH 5 MG TABLET. 01/23/22   Billie Ruddy, MD  baclofen (LIORESAL) 20 MG tablet TAKE 1 TABLET EVERY DAY AT 6 PM 09/07/21   Billie Ruddy, MD  gabapentin (NEURONTIN) 400 MG capsule TAKE 1 CAPSULE 3 TIMES A DAY AT 8 AM, 1 PM, AND 6 PM 08/02/21   Billie Ruddy, MD  hydrochlorothiazide (HYDRODIURIL) 25 MG tablet TAKE 1 TABLET BY  MOUTH EVERY DAY 11/07/21   Billie Ruddy, MD  ibuprofen (ADVIL) 800 MG tablet TAKE 1 TABLET BY MOUTH THREE TIMES A DAY 11/12/21   Billie Ruddy, MD  lactulose (CHRONULAC) 10 GM/15ML solution TAKE 30 MLS (20 G TOTAL) BY MOUTH 2 (TWO) TIMES DAILY. 10/03/21   Billie Ruddy, MD  nystatin (MYCOSTATIN/NYSTOP) powder APPLY TOPICALLY 2 TIMES DAILY AT 12 NOON AND 4 PM. 09/26/21   Billie Ruddy, MD  omeprazole (PRILOSEC) 20 MG capsule Take 1 capsule (20 mg total) by mouth daily. 10/03/22   Charlesetta Shanks, MD  tamsulosin (FLOMAX) 0.4 MG CAPS capsule TAKE 1 CAPSULE (0.4 MG TOTAL) BY MOUTH DAILY AFTER SUPPER. Patient not taking: No sig reported 03/14/21   Billie Ruddy, MD    ___________________________________________________________________________________________________  Physical Exam:    11/14/2022    6:00 PM 11/14/2022    5:05 PM 11/14/2022    4:30 PM  Vitals with BMI  Systolic 409 811   Diastolic 66 80   Pulse 88 100 106    1. General:  in No  Acute distress   Chronically ill   -appearing 2. Psychological: Alert and   Oriented 3. Head/ENT:   Dry Mucous Membranes                          Head Non traumatic, neck supple                           Poor Dentition 4. SKIN decreased Skin turgor,  Skin clean Dry               5. Heart: Regular rate and rhythm no  Murmur, no Rub or gallop 6. Lungs:  no wheezes or crackles   7. Abdomen: Soft,  obese  bowel diminished 8. Lower extremities: no clubbing, cyanosis trace edema 9. Neurologically Grossly intact, moving all 4 extremities equally   10. MSK: Normal range of motion    Chart has been reviewed  ______________________________________________________________________________________________  Assessment/Plan 51 y.o. female with medical history significant of paraplegia as a result of laminectomy, chronic anemia, morbid obesity, hypertension, constipation,   Admitted for rash  and Symptomatic anemia    Present on  Admission:  Anemia  Leukocytosis  Essential hypertension  Hypokalemia  Rash  Constipation  Ileus (HCC)     Class 3 severe obesity due to excess calories without serious comorbidity with body mass index (BMI) of 60.0 to 69.9 in adult [facility]) He has a follow-up as an outpatient for obesity management  Anemia Transfuse 1 unit Hemoccult negative. Patient has been followed up as an outpatient for chronic anemia Obtain anemia panel At some point she does need further workup of her anemia she has been seen as an outpatient by [facility] who thought that patient needs OR in order to undergo endoscopy. Hemoccult negative today  Leukocytosis In the setting of recent steroid use  Essential hypertension Allow permissive hypertension for tonight  Hypokalemia Replace and check magnesium level replace electrolytes as needed  Rash Differential includes allergic reaction to medication versus infection versus autoimmune disorder. Ongoing for the past 2 weeks Worse with steroids Although redness present no warmth no fever to suggest cellulitis discussed with ID at this point recommend holding off on further antibiotics Continue Benadryl and Pepcid for now No oral involvement Not typical for Stevens-Johnson presentation at this time but needs to be further followed Would benefit from dermatology evaluation if available appreciate ID consult Sed rate unremarkable Order mycoplasma HIV RPR Hold Bentyl and other home medications for now as it is unclear what patient reacted to   Constipation Order MiraLAX, bowel regimen KUB when able to tolerate to evaluate for stool burden  Ileus ([place]) KUB suspected for ileus vs obstruction Ordered CT abd/pelvis  Keep NPO If evidence of SBO will need gen Surgery consult and possible NG tube if nausea At this time pt denies   Other plan as per orders.  DVT prophylaxis:  SCD      Code Status:    Code Status: Prior FULL CODE  as per patient   I had  personally discussed CODE STATUS with patient      Family Communication:   Family not at  Bedside    Disposition Plan:    To home once workup is complete and patient is stable   Following barriers for discharge:                            Electrolytes corrected                               Anemia corrected                                                         Will need consultants to evaluate patient prior to discharge                     Would benefit from PT/OT eval prior to DC  Ordered                       Consults called: ID aware will see in AM  Admission status:  ED Disposition     ED Disposition  [place]: [facility] [[id number]]  Level of Care: Progressive [102]  Admit to Progressive based on following criteria: MULTISYSTEM THREATS such as stable sepsis, metabolic/electrolyte imbalance with or without encephalopathy that is responding to early treatment.  May admit patient to [facility] or [facility] if equivalent level of care is available:: No  Covid Evaluation: Asymptomatic - no recent exposure (last 10 days) testing not required  Diagnosis: Cellulitis [[id number]]  Admitting Physician: Toy Baker [[id number]]  Attending Physician: Toy Baker [[id number]]  Certification:: I certify this patient will need inpatient services for at least 2 midnights  Estimated Length of Stay: 2            inpatient     I Expect 2 midnight stay secondary to severity of patient's current illness need for inpatient interventions justified by the following:  hemodynamic instability despite optimal treatment (tachycardia  )  Severe lab/radiological/exam abnormalities including:    Rash, anemia and extensive comorbidities including:    Morbid Obesity    That are currently affecting medical management.   I expect  patient to be hospitalized for 2 midnights requiring inpatient medical care.  Patient is at high risk for  adverse outcome (such as loss of life or disability) if not treated.  Indication for inpatient stay as follows:    Hemodynamic instability despite maximal medical therapy,      Need for IV antibiotics, IV fluids,    Level of care       progressive tele indefinitely please discontinue once patient no longer qualifies COVID-19 Labs    Ellen Hill 11/14/2022, 8:25 PM    [facility]     after 2 AM please page floor coverage PA If 7AM-7PM, please contact the day team taking care of the patient using [email or web address]   Patient was evaluated in the context of the global COVID-19 pandemic, which necessitated consideration that the patient might be at risk for infection with the SARS-CoV-2 virus that causes COVID-19. Institutional protocols and algorithms that pertain to the evaluation of patients at risk for COVID-19 are in a state of rapid change based on information released by  regulatory bodies including the CDC and federal and state organizations. These policies and algorithms were followed during the patient's care.

## 2022-11-14 NOTE — ED Notes (Signed)
IV unsuccessful.  EDP made aware.

## 2022-11-14 NOTE — Assessment & Plan Note (Addendum)
Order MiraLAX, bowel regimen KUB when able to tolerate to evaluate for stool burden

## 2022-11-14 NOTE — ED Notes (Signed)
Pt noted to have generalized hives and blisters on anterior of body, except palms of hands.  No blisters or hives on posterior body.

## 2022-11-14 NOTE — Assessment & Plan Note (Signed)
Transfuse 1 unit Hemoccult negative. Patient has been followed up as an outpatient for chronic anemia Obtain anemia panel At some point she does need further workup of her anemia she has been seen as an outpatient by [facility] who thought that patient needs OR in order to undergo endoscopy. Hemoccult negative today

## 2022-11-14 NOTE — ED Provider Notes (Signed)
[facility] DEPT Provider Note   CSN: [id number] Arrival date & time: 11/14/22  1331     History  Chief Complaint  Patient presents with   Allergic Reaction    Ellen Hill is a 51 y.o. female.   Allergic Reaction    Patient with medical history of morbid obesity, anemia, neurogenic bowel and bladder, limited mobility secondary to decompression for myelopathy, hypertension, asthma,  presenting today due to rash.  Patient states she was started on bentyl 11/8 through 11/13 for abdominal pain.  Stopped Bentyl 11/13 because developed having pruritus across her abdomen.  Rash started forming 4 days later initially on her arms bilaterally.  It was pruritic, started on oral prednisone which she is currently taking.  The rash is been spreading and is on her lower extremities, abdomen, chest wall.   There are blisters that are formed on the upper and lower extremities.  She denies any fevers, no history of anaphylaxis.  No difficulty swallowing, shortness of breath, abdominal pain or vomiting.  No recent antibiotic usage.  Home Medications Prior to Admission medications   Medication Sig Start Date End Date Taking? Authorizing Provider  baclofen (LIORESAL) 10 MG tablet TAKE 1 TABLET TWICE A DAY AT 8 AM AND 1 PM WITH 5 MG TABLET. 01/23/22   Billie Ruddy, MD  baclofen (LIORESAL) 20 MG tablet TAKE 1 TABLET EVERY DAY AT 6 PM 09/07/21   Billie Ruddy, MD  gabapentin (NEURONTIN) 400 MG capsule TAKE 1 CAPSULE 3 TIMES A DAY AT 8 AM, 1 PM, AND 6 PM 08/02/21   Billie Ruddy, MD  hydrochlorothiazide (HYDRODIURIL) 25 MG tablet TAKE 1 TABLET BY MOUTH EVERY DAY 11/07/21   Billie Ruddy, MD  ibuprofen (ADVIL) 800 MG tablet TAKE 1 TABLET BY MOUTH THREE TIMES A DAY 11/12/21   Billie Ruddy, MD  lactulose (CHRONULAC) 10 GM/15ML solution TAKE 30 MLS (20 G TOTAL) BY MOUTH 2 (TWO) TIMES DAILY. 10/03/21   Billie Ruddy, MD  nystatin (MYCOSTATIN/NYSTOP) powder  APPLY TOPICALLY 2 TIMES DAILY AT 12 NOON AND 4 PM. 09/26/21   Billie Ruddy, MD  omeprazole (PRILOSEC) 20 MG capsule Take 1 capsule (20 mg total) by mouth daily. 10/03/22   Charlesetta Shanks, MD  tamsulosin (FLOMAX) 0.4 MG CAPS capsule TAKE 1 CAPSULE (0.4 MG TOTAL) BY MOUTH DAILY AFTER SUPPER. Patient not taking: No sig reported 03/14/21   Billie Ruddy, MD      Allergies    Patient has no known allergies.    Review of Systems   Review of Systems  Physical Exam Updated Vital Signs BP 135/66   Pulse 88   Temp 98.1 F (36.7 C)   Resp 17   Ht '5\' 6"'$  (1.676 m)   Wt (!) 163.3 kg   SpO2 100%   BMI 58.11 kg/m  Physical Exam Vitals and nursing note reviewed. Exam conducted with a chaperone present.  Constitutional:      Appearance: Normal appearance. She is ill-appearing.  HENT:     Head: Normocephalic and atraumatic.  Eyes:     General: No scleral icterus.       Right eye: No discharge.        Left eye: No discharge.     Extraocular Movements: Extraocular movements intact.     Pupils: Pupils are equal, round, and reactive to light.  Cardiovascular:     Rate and Rhythm: Regular rhythm. Tachycardia present.     Pulses: Normal  pulses.     Heart sounds: Normal heart sounds. No murmur heard.    No friction rub. No gallop.  Pulmonary:     Effort: Pulmonary effort is normal. No respiratory distress.     Breath sounds: Normal breath sounds.     Comments: Decreased lung sounds secondary to body habitus.  I do not appreciate wheezing or stridor Abdominal:     General: Abdomen is flat. Bowel sounds are normal. There is no distension.     Palpations: Abdomen is soft.     Tenderness: There is no abdominal tenderness.  Genitourinary:    General: Normal vulva.     Rectum: Normal.     Comments: No active hemorrhaging, stools not obviously melanic.  Fecal occult pending Skin:    General: Skin is warm and dry.     Coloration: Skin is not jaundiced.     Findings: Rash present.      Comments: See photos, rashes on upper extremities bilaterally, torso, abdomen, inguinal creases and lower extremities.  Not on her back, does not extend into the genital region.  Spares soles and palms, oral mucosa. +Decubitus ulcer.   Neurological:     Mental Status: She is alert. Mental status is at baseline.     Coordination: Coordination normal.            ED Results / Procedures / Treatments   Labs (all labs ordered are listed, but only abnormal results are displayed) Labs Reviewed  CBC WITH DIFFERENTIAL/PLATELET - Abnormal; Notable for the following components:      Result Value   WBC 16.8 (*)    Hemoglobin 6.8 (*)    HCT 25.8 (*)    MCV 62.8 (*)    MCH 16.5 (*)    MCHC 26.4 (*)    RDW 22.9 (*)    All other components within normal limits  COMPREHENSIVE METABOLIC PANEL - Abnormal; Notable for the following components:   Potassium 3.3 (*)    Glucose, Bld 148 (*)    BUN 25 (*)    Calcium 8.3 (*)    Albumin 3.2 (*)    All other components within normal limits  CULTURE, BLOOD (ROUTINE X 2)  CULTURE, BLOOD (ROUTINE X 2)  SEDIMENTATION RATE  HCG, SERUM, QUALITATIVE  LACTIC ACID, PLASMA  OCCULT BLOOD X 1 CARD TO LAB, STOOL  RAPID HIV SCREEN (HIV 1/2 AB+AG)  LACTIC ACID, PLASMA  RPR  TYPE AND SCREEN  PREPARE RBC (CROSSMATCH)  ABO/RH    EKG EKG Interpretation  Date/Time:  Thursday November 14 2022 13:44:15 EST Ventricular Rate:  130 PR Interval:  123 QRS Duration: 79 QT Interval:  305 QTC Calculation: 449 R Axis:   59 Text Interpretation: Sinus tachycardia Ventricular premature complex Aberrant complex Low voltage, precordial leads Borderline ST depression, diffuse leads Abnormal T, consider ischemia, diffuse leads no prior for comparison Confirmed by Margaretmary Eddy [phone]) on 11/14/2022 2:01:40 PM  Radiology DG Chest Portable 1 View  Result Date: 11/14/2022 CLINICAL DATA:  weakness EXAM: PORTABLE CHEST 1 VIEW COMPARISON:  Chest x-ray 05/20/2011  FINDINGS: The heart and mediastinal contours are within normal limits. Left costophrenic angle and left base/hemidiaphragm collimated off view. No focal consolidation. No pulmonary edema. No pleural effusion. No pneumothorax. No acute osseous abnormality. IMPRESSION: No active disease; however, left costophrenic angle and left base/hemidiaphragm collimated off view. Cannot exclude consolidation at the left base. Recommend repeat PA and lateral view of the chest. Electronically Signed   By: Clelia Croft.D.  On: 11/14/2022 17:07    Procedures .Critical Care  Performed by: Sherrill Raring, PA-C Authorized by: Sherrill Raring, PA-C   Critical care provider statement:    Critical care time (minutes):  37   Critical care start time:  11/14/2022 5:30 PM   Critical care end time:  11/14/2022 6:07 PM   Critical care time was exclusive of:  Teaching time and separately billable procedures and treating other patients   Critical care was necessary to treat or prevent imminent or life-threatening deterioration of the following conditions: Anemia, hgb 6.8.   Critical care was time spent personally by me on the following activities:  Development of treatment plan with patient or surrogate, discussions with consultants, evaluation of patient's response to treatment, examination of patient, ordering and review of laboratory studies, ordering and review of radiographic studies, ordering and performing treatments and interventions, pulse oximetry, re-evaluation of patient's condition and review of old charts   Care discussed with: admitting provider       Medications Ordered in ED Medications  cefTRIAXone (ROCEPHIN) 1 g in sodium chloride 0.9 % 100 mL IVPB (has no administration in time range)  diphenhydrAMINE (BENADRYL) injection 25 mg (25 mg Intravenous Given 11/14/22 1820)  famotidine (PEPCID) IVPB 20 mg premix (20 mg Intravenous New Bag/Given 11/14/22 1823)  sodium chloride 0.9 % bolus 1,000 mL (1,000 mLs  Intravenous New Bag/Given 11/14/22 1824)  0.9 %  sodium chloride infusion (10 mL/hr Intravenous New Bag/Given 11/14/22 1820)    ED Course/ Medical Decision Making/ A&P Clinical Course as of 11/14/22 1853  Thu Nov 14, 2022  1530 Patient persistently tachycardic with rate of 119, leukocytosis of 16.8.  Although afebrile meet SIRS criteria we will check lactic and culture. [HS]  1735 Ultrasound Guided Peripheral IV Indication: difficult to access - nursing staff unable to secure adequate peripheral IV access Location: R Antecubital Catheter Size: 18 G  Static views used to identify the target vein then usual prep with Chloraprep. IV placed on first attempt under dynamic US guidance. Dark red flash noted, and catheter advanced smoothly into the vein. NS saline flushed without resistance, witnessed swelling, or patient discomfort. IV secured with I-site and tegaderm. The patient tolerated the procedure well. Performed By: Margaretmary Eddy MD  [RP]  [phone] Spoke with [facility] they do not have any beds were not excepting transfers for admission.  I did consult with dermatology and spoke with resident Dr. Zacarias Pontes, unable to give significant directions without actually examining the patient.  Agrees with fluids, antibiotics [HS]  9767 DG Chest Portable 1 View Unable to fully visualize left lower lobe, patient has not been coughing, no fevers and denies any viral URI symptoms.  Doubt pneumonia. [HS]  3419 I spoke with [facility] transfer line, they are at capacity and declined patient. Will reach out to [facility] [HS]  Fuig with [facility], they are not accepting admissions and wait list is full. [HS]    Clinical Course User Index [HS] Sherrill Raring, PA-C [RP] Fransico Meadow, MD                           Medical Decision Making Amount and/or Complexity of Data Reviewed Labs: ordered. Radiology: ordered. Decision-making details documented in ED Course.  Risk Prescription drug  management. Decision regarding hospitalization.   Patient presents due to rash.  She is afebrile, she is tachycardic and tachypneic.  There is no mucosal involvement although SJS and TN are still  consideration she has not been on any antibiotics or obvious triggers.  She is not hypoxic, she is protecting her airway and I do not see any indication for epinephrine.  Rash seems consistent with erythema multiforme, there also bullous lesions which could be concerning for new onset of autoimmune.  There appears to be superimposed cellulitis to her abdominal wall  Will check basic labs, fluids.  Also will include RPR and HIV due to disseminated rash and absence of immunocompromise state.  I would not begin laboratory workup. CBC with leukocytosis of 16.8.  Patient is anemic with a hemoglobin of 6.8.  She adamantly denies any bleeding, states she is always chronically anemic and does not know why.  Denies any vaginal bleeding, hematemesis. CMP without gross electrolyte derangement or AKI.  No transaminitis. Sed rate within normal limits. Patient is not pregnant. Lactic acid 1.7 Blood cultures pending RPR pending HIV pending Fecal occult negative.  I have ordered fluids, blood transfusion, Rocephin, Benadryl and Pepcid.  I made consults as documented in ED course.  [facility] and [facility] have all declined patient.  She has leukocytosis but is not septic with a lactic within normal limits.  Patient will need admission for IV antibiotics to treat cellulitis, additional workup for diffuse rash and symptomatic anemia.  I will consult the hospitalist for admission.  Discussed HPI, physical exam and plan of care for this patient with attending Margaretmary Eddy. The attending physician evaluated this patient as part of a shared visit and agrees with plan of care.         Final Clinical Impression(s) / ED Diagnoses Final diagnoses:  Cellulitis, unspecified cellulitis site  Symptomatic anemia    Rx  / DC Orders ED Discharge Orders     None         Sherrill Raring, Hershal Coria 11/14/22 1853    Fransico Meadow, MD 11/14/22 [phone]

## 2022-11-14 NOTE — ED Notes (Signed)
[facility] RN unable to obtain US IV x2.  IV team to be consulted.

## 2022-11-14 NOTE — Assessment & Plan Note (Signed)
Allow permissive hypertension for tonight 

## 2022-11-14 NOTE — ED Notes (Signed)
ED TO INPATIENT HANDOFF REPORT  Name/Age/Gender Ellen Hill 51 y.o. female  Code Status Code Status History     Date Active Date Inactive Code Status Order ID Comments User Context   01/29/2019 1410 03/05/2019 1757 Full Code [id number]  [facility] Sauer Inpatient   01/29/2019 1410 01/29/2019 1410 Full Code [id number]  [facility] Sauer Inpatient   01/17/2019 1504 01/29/2019 1340 Full Code [id number]  [facility]   07/06/2013 1533 07/07/2013 1251 Full Code [id number]  Gayland Curry, MD Inpatient       Home/SNF/Other Rehab  Chief Complaint Cellulitis [L03.90]  Level of Care/Admitting Diagnosis ED Disposition     ED Disposition  Admit   Condition  --   Comment  Hospital Area: [facility] [[id number]]  Level of Care: Progressive [102]  Admit to Progressive based on following criteria: MULTISYSTEM THREATS such as stable sepsis, metabolic/electrolyte imbalance with or without encephalopathy that is responding to early treatment.  May admit patient to [facility] or [facility] if equivalent level of care is available:: No  Covid Evaluation: Asymptomatic - no recent exposure (last 10 days) testing not required  Diagnosis: Cellulitis [361443]  Admitting Physician: Toy Baker [[id number]]  Attending Physician: Toy Baker [[id number]]  Certification:: I certify this patient will need inpatient services for at least 2 midnights  Estimated Length of Stay: 2          Medical History Past Medical History:  Diagnosis Date   Abnormal Pap smear    history   Anemia    ASCUS (atypical squamous cells of undetermined significance) on Pap smear 2008   At [place] of pregnancy ; colpo   Asthma    rarely uses inhaler (seasonal)   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 after loss of parents, is fine   Vaginal Pap smear, abnormal    f/u wnl    Allergies No Known Allergies  IV Location/Drains/Wounds Patient Lines/Drains/Airways Status     Active Line/Drains/Airways     Name Placement date Placement time Site Days   Peripheral IV 11/14/22 18 G Anterior;Proximal;Right Forearm 11/14/22  1803  Forearm  less than 1   Incision (Closed) 01/19/19 Back 01/19/19  1644  -- 1395   Incision - 4 Ports Abdomen Umbilicus Mid;Upper Right;Lateral Right;Medial 07/06/13  --  -- 3418            Labs/Imaging Results for orders placed or performed during the hospital encounter of 11/14/22 (from the past 48 hour(s))  CBC with Differential     Status: Abnormal (Preliminary result)   Collection Time: 11/14/22  2:56 PM  Result Value Ref Range   WBC 16.8 (H) 4.0 - 10.5 K/uL   RBC 4.11 3.87 - 5.11 MIL/uL   Hemoglobin 6.8 (LL) 12.0 - 15.0 g/dL    Comment: REPEATED TO VERIFY Reticulocyte Hemoglobin testing may be clinically indicated, consider ordering this additional test [id number] THIS CRITICAL RESULT HAS VERIFIED AND BEEN CALLED TO A.CHENEY, RN BY Ovid Curd  THOMPSON ON 11 23 2023 AT 1526, AND HAS BEEN READ BACK. CRITICAL RESULT VERIFIED    HCT 25.8 (L) 36.0 - 46.0 %   MCV 62.8 (L) 80.0 - 100.0 fL   MCH 16.5 (L) 26.0 - 34.0 pg   MCHC 26.4 (L) 30.0 - 36.0 g/dL   RDW 22.9 (H) 11.5 - 15.5 %   Platelets 344 150 - 400 K/uL   nRBC 0.2 0.0 - 0.2 %    Comment: Performed at [facility], [place] [address], [place], [place] [address]   Neutrophils Relative % PENDING %   Neutro Abs PENDING 1.7 - 7.7 K/uL   Band Neutrophils PENDING %   Lymphocytes Relative PENDING %   Lymphs Abs PENDING 0.7 - 4.0 K/uL   Monocytes Relative PENDING %   Monocytes Absolute PENDING 0.1 - 1.0 K/uL    Eosinophils Relative PENDING %   Eosinophils Absolute PENDING 0.0 - 0.5 K/uL   Basophils Relative PENDING %   Basophils Absolute PENDING 0.0 - 0.1 K/uL   WBC Morphology PENDING    RBC Morphology PENDING    Smear Review PENDING    Other PENDING %   nRBC PENDING 0 /100 WBC   Metamyelocytes Relative PENDING %   Myelocytes PENDING %   Promyelocytes Relative PENDING %   Blasts PENDING %   Immature Granulocytes PENDING %   Abs Immature Granulocytes PENDING 0.00 - 0.07 K/uL  Comprehensive metabolic panel     Status: Abnormal   Collection Time: 11/14/22  2:56 PM  Result Value Ref Range   Sodium 141 135 - 145 mmol/L   Potassium 3.3 (L) 3.5 - 5.1 mmol/L   Chloride 110 98 - 111 mmol/L   CO2 25 22 - 32 mmol/L   Glucose, Bld 148 (H) 70 - 99 mg/dL    Comment: Glucose reference range applies only to samples taken after fasting for at least 8 hours.   BUN 25 (H) 6 - 20 mg/dL   Creatinine, Ser 0.78 0.44 - 1.00 mg/dL   Calcium 8.3 (L) 8.9 - 10.3 mg/dL   Total Protein 6.6 6.5 - 8.1 g/dL   Albumin 3.2 (L) 3.5 - 5.0 g/dL   AST 15 15 - 41 U/L   ALT 15 0 - 44 U/L   Alkaline Phosphatase 60 38 - 126 U/L   Total Bilirubin 0.4 0.3 - 1.2 mg/dL   GFR, Estimated >60 >60 mL/min    Comment: (NOTE) Calculated using the CKD-EPI Creatinine Equation (2021)    Anion gap 6 5 - 15    Comment: Performed at [facility], [place] [address], [place], [place] [address]  Sedimentation rate     Status: None   Collection Time: 11/14/22  2:56 PM  Result Value Ref Range   Sed Rate 9 0 - 22 mm/hr    Comment: Performed at [facility], [place] [address], [place], [place] [address]  hCG, serum, qualitative     Status: None   Collection Time: 11/14/22  2:56 PM  Result Value Ref Range   Preg, Serum NEGATIVE NEGATIVE    Comment:        THE SENSITIVITY OF THIS METHODOLOGY IS >10 mIU/mL. Performed at [facility], [place] [address] [place], [place] [address]   ABO/Rh      Status: None   Collection Time: 11/14/22  2:56 PM  Result Value Ref Range   ABO/RH(D)      A POS Performed at [facility], [place] [address], [place], [place] [address]   Prepare RBC (crossmatch)     Status: None   Collection Time: 11/14/22  4:35 PM  Result Value Ref Range   Order Confirmation      ORDER PROCESSED BY BLOOD BANK Performed at [facility], [place] [address] [place], [place] [address]   Lactic acid, plasma     Status: None   Collection Time: 11/14/22  5:30 PM  Result Value Ref Range   Lactic Acid, Venous 1.7 0.5 - 1.9 mmol/L    Comment: Performed at [facility], [place] [address] [place], [place] [address]  Blood culture (routine x 2)     Status: None (Preliminary result)   Collection Time: 11/14/22  5:30 PM   Specimen: BLOOD RIGHT FOREARM  Result Value Ref Range   Specimen Description      BLOOD RIGHT FOREARM Performed at [facility], [place] [address] [place], [place] [address]    Special Requests      BOTTLES DRAWN AEROBIC AND ANAEROBIC Blood Culture adequate volume Performed at [place] [address], [place], [place] [address]    Culture PENDING    Report Status PENDING   Type and screen [place]     Status: None (Preliminary result)   Collection Time: 11/14/22  5:30 PM  Result Value Ref Range   ABO/RH(D) A POS    Antibody Screen NEG    Sample Expiration 11/17/2022,2359    Unit Number [id number]    Blood Component Type RBC LR PHER2    Unit division 00    Status of Unit ALLOCATED    Transfusion Status OK TO TRANSFUSE    Crossmatch Result      Compatible Performed at [facility], [place] [address], [place], [place] [address]    Unit Number [id number]    Blood Component Type RED CELLS,LR    Unit division 00    Status of Unit ALLOCATED    Transfusion Status OK TO TRANSFUSE    Crossmatch Result Compatible    Unit Number  [id number]    Blood Component Type RED CELLS,LR    Unit division 00    Status of Unit ALLOCATED    Transfusion Status OK TO TRANSFUSE    Crossmatch Result Compatible   Rapid HIV screen (HIV 1/2 Ab+Ag)     Status: None   Collection Time: 11/14/22  5:30 PM  Result Value Ref Range   HIV-1 P24 Antigen - HIV24 NON REACTIVE NON REACTIVE    Comment: EMTP K [place] AT 1836 11/14/22 [place]  RESULT CALLED TO, READ BACK BY AND VERIFIED WITH: (NOTE) Detection of p24 may be inhibited by biotin in the sample, causing false negative results in acute infection.    HIV 1/2 Antibodies NON REACTIVE NON REACTIVE    Comment: EMTP K [place] AT 1836 11/14/22 [place] A RESULT CALLED TO, READ BACK BY AND VERIFIED WITH:    Interpretation (HIV Ag Ab)      A non reactive test result means that HIV 1 or HIV 2 antibodies and HIV 1 p24 antigen were not detected in the specimen.    Comment: Performed at [facility], [place] [address] [place], [place] [address]  Occult blood card to lab, stool     Status: None   Collection Time: 11/14/22  5:32 PM  Result Value Ref Range   Fecal Occult Bld NEGATIVE NEGATIVE  Comment: Performed at [facility], [place] [address], [place], [place] [address]   DG Chest Portable 1 View  Result Date: 11/14/2022 CLINICAL DATA:  weakness EXAM: PORTABLE CHEST 1 VIEW COMPARISON:  Chest x-ray 05/20/2011 FINDINGS: The heart and mediastinal contours are within normal limits. Left costophrenic angle and left base/hemidiaphragm collimated off view. No focal consolidation. No pulmonary edema. No pleural effusion. No pneumothorax. No acute osseous abnormality. IMPRESSION: No active disease; however, left costophrenic angle and left base/hemidiaphragm collimated off view. Cannot exclude consolidation at the left base. Recommend repeat PA and lateral view of the chest. Electronically Signed   By: Iven Finn M.D.   On: 11/14/2022 17:07     Pending Labs Unresulted Labs (From admission, onward)     Start     Ordered   11/15/22 0500  Prealbumin  Tomorrow morning,   R        11/14/22 1913   11/14/22 1914  Magnesium  Add-on,   AD        11/14/22 1913   11/14/22 1914  Phosphorus  Add-on,   AD        11/14/22 1913   11/14/22 1914  TSH  Add-on,   AD        11/14/22 1913   11/14/22 1913  Urinalysis, Routine w reflex microscopic  ONCE - URGENT,   URGENT        11/14/22 1913   11/14/22 1913  Procalcitonin  Add-on,   AD        11/14/22 1913   11/14/22 1913  Urine Culture  ONCE - URGENT,   URGENT       Question:  Indication  Answer:  Sepsis   11/14/22 1913   11/14/22 1913  Fibrinogen  Add-on,   AD        11/14/22 1913   11/14/22 1912  Vitamin B12  (Anemia Panel (PNL))  Once,   R        11/14/22 1911   11/14/22 1912  Folate  (Anemia Panel (PNL))  Once,   R        11/14/22 1911   11/14/22 1912  Iron and TIBC  (Anemia Panel (PNL))  Once,   R        11/14/22 1911   11/14/22 1912  Ferritin  (Anemia Panel (PNL))  Once,   R        11/14/22 1911   11/14/22 1912  Reticulocytes  (Anemia Panel (PNL))  Once,   R        11/14/22 1911   11/14/22 1912  Protime-INR  ONCE - STAT,   STAT        11/14/22 1913   11/14/22 1912  APTT  ONCE - STAT,   STAT        11/14/22 1913   11/14/22 1535  RPR  Once,   URGENT        11/14/22 1534   11/14/22 1531  Lactic acid, plasma  Now then every 2 hours,   R      11/14/22 1531   11/14/22 1531  Blood culture (routine x 2)  BLOOD CULTURE X 2,   R      11/14/22 1531            Vitals/Pain Today's Vitals   11/14/22 1705 11/14/22 1742 11/14/22 1800 11/14/22 1900  BP: (!) 152/80  135/66 136/79  Pulse: 100  88 86  Resp: '16  17 16  '$ Temp:  98.1 F (  36.7 C)    SpO2: 98%  100% 100%  Weight:      Height:      PainSc:        Isolation Precautions No active isolations  Medications Medications  cefTRIAXone (ROCEPHIN) 1 g in sodium chloride 0.9 % 100 mL IVPB (1 g Intravenous New Bag/Given  11/14/22 1858)  potassium chloride 10 mEq in 100 mL IVPB (has no administration in time range)  diphenhydrAMINE (BENADRYL) injection 25 mg (25 mg Intravenous Given 11/14/22 1820)  famotidine (PEPCID) IVPB 20 mg premix (0 mg Intravenous Stopped 11/14/22 1856)  sodium chloride 0.9 % bolus 1,000 mL (1,000 mLs Intravenous New Bag/Given 11/14/22 1824)  0.9 %  sodium chloride infusion (10 mL/hr Intravenous New Bag/Given 11/14/22 1820)    Mobility non-ambulatory

## 2022-11-15 ENCOUNTER — Other Ambulatory Visit: Payer: Self-pay

## 2022-11-15 ENCOUNTER — Inpatient Hospital Stay: Payer: Self-pay

## 2022-11-15 ENCOUNTER — Inpatient Hospital Stay (HOSPITAL_COMMUNITY): Payer: Medicare HMO

## 2022-11-15 ENCOUNTER — Encounter (HOSPITAL_COMMUNITY): Payer: Self-pay | Admitting: Internal Medicine

## 2022-11-15 DIAGNOSIS — K567 Ileus, unspecified: Secondary | ICD-10-CM | POA: Diagnosis present

## 2022-11-15 DIAGNOSIS — R21 Rash and other nonspecific skin eruption: Secondary | ICD-10-CM | POA: Diagnosis not present

## 2022-11-15 DIAGNOSIS — D649 Anemia, unspecified: Secondary | ICD-10-CM

## 2022-11-15 DIAGNOSIS — L519 Erythema multiforme, unspecified: Secondary | ICD-10-CM

## 2022-11-15 DIAGNOSIS — D509 Iron deficiency anemia, unspecified: Secondary | ICD-10-CM

## 2022-11-15 DIAGNOSIS — E876 Hypokalemia: Secondary | ICD-10-CM | POA: Diagnosis not present

## 2022-11-15 DIAGNOSIS — I10 Essential (primary) hypertension: Secondary | ICD-10-CM | POA: Diagnosis not present

## 2022-11-15 LAB — RESPIRATORY PANEL BY PCR

## 2022-11-15 LAB — RPR: RPR Ser Ql: NONREACTIVE

## 2022-11-15 MED ORDER — SORBITOL 70 % SOLN
960.0000 mL | TOPICAL_OIL | Freq: Once | ORAL | Status: AC
  Administered 2022-11-15: 960 mL via RECTAL
  Filled 2022-11-15: qty 240

## 2022-11-15 MED ORDER — BOOST / RESOURCE BREEZE PO LIQD CUSTOM
1.0000 | Freq: Three times a day (TID) | ORAL | Status: DC
  Administered 2022-11-16 (×2): 1 via ORAL

## 2022-11-15 MED ORDER — SODIUM CHLORIDE 0.9 % IV SOLN
1.0000 mg | Freq: Every day | INTRAVENOUS | Status: AC
  Administered 2022-11-15 – 2022-11-20 (×6): 1 mg via INTRAVENOUS
  Filled 2022-11-15 (×6): qty 0.2

## 2022-11-15 MED ORDER — BISACODYL 10 MG RE SUPP
10.0000 mg | Freq: Every day | RECTAL | Status: DC
  Administered 2022-11-15 – 2022-11-18 (×3): 10 mg via RECTAL
  Filled 2022-11-15 (×5): qty 1

## 2022-11-15 MED ORDER — LIP MEDEX EX OINT: TOPICAL_OINTMENT | CUTANEOUS | Status: DC | PRN

## 2022-11-15 MED ORDER — HYDROXYZINE HCL 25 MG PO TABS
25.0000 mg | ORAL_TABLET | Freq: Three times a day (TID) | ORAL | Status: DC | PRN
  Administered 2022-11-15 – 2022-11-20 (×11): 25 mg via ORAL
  Filled 2022-11-15 (×11): qty 1

## 2022-11-15 MED ORDER — CALAMINE EX LOTN
1.0000 | TOPICAL_LOTION | CUTANEOUS | Status: DC | PRN
  Administered 2022-11-16 – 2022-11-17 (×2): 1 via TOPICAL
  Filled 2022-11-15: qty 177

## 2022-11-15 MED ORDER — IOHEXOL 300 MG/ML  SOLN
100.0000 mL | Freq: Once | INTRAMUSCULAR | Status: AC | PRN
  Administered 2022-11-15: 100 mL via INTRAVENOUS

## 2022-11-15 MED ORDER — HYDROXYZINE HCL 50 MG/ML IM SOLN
25.0000 mg | Freq: Once | INTRAMUSCULAR | Status: AC
  Administered 2022-11-15: 25 mg via INTRAMUSCULAR
  Filled 2022-11-15: qty 0.5

## 2022-11-15 MED ORDER — PEG 3350-KCL-NA BICARB-NACL 420 G PO SOLR
4000.0000 mL | Freq: Once | ORAL | Status: AC
  Administered 2022-11-15: 4000 mL via ORAL

## 2022-11-15 MED ORDER — SODIUM CHLORIDE (PF) 0.9 % IJ SOLN
INTRAMUSCULAR | Status: AC
  Filled 2022-11-15: qty 50

## 2022-11-15 NOTE — Progress Notes (Signed)
PROGRESS NOTE    [facility]  BZJ:[id number] DOB: Aug 05, 1971 DOA: 11/14/2022 PCP: [facility]    Chief Complaint  Patient presents with   Allergic Reaction    Brief Narrative:  Patient is a 51 year old female history of paraplegia as a result of laminectomy, history of chronic anemia, morbid obesity, hypertension, constipation presented with a rash ongoing x 2 weeks which had developed on her anterior trunk and extremities and abdomen started on doxycycline and treated with steroids however no significant improvement.  Patient noted not to have any mucosal involvement or rash of the palms of the hands and the feet no involvement of the posterior body.  Patient also noted to have a history of drug-induced constipation.  Patient noted on 10/30/2022 to be started on Bentyl however reported rash started on her legs progressed up to 2 days after starting Bentyl and using steroids only made rash worse and spread with a burning and itching sensation.  Patient noted to be followed by GI for chronic anemia and was to undergo endoscopy in the OR unfortunately developed rash and unable to get this done.  Patient presented to the ED, noted to have a significant anemia with a hemoglobin of 6.8 and admitted.  Patient transfused 2 units packed red blood cells.  Posttransfusion CBC pending.  CT abdomen and pelvis done due to concern for ileus with circumferential irregular thickening of the ascending colon proximal to the hepatic flexure, small mesenteric lymph nodes findings concerning for colonic neoplasm, second potential site of thickening in the sigmoid also raising concern for colorectal neoplasm, significant fecal impaction, left ureteral calculus/calculi with mild fullness of the left ureter stable appearance of intra renal calculi on the left since previous imaging, signs of body wall edema.  GI consulted for further evaluation.  ID also consulted for evaluation of rash.   Assessment  & Plan:   Active Problems:   Class 3 severe obesity due to excess calories without serious comorbidity with body mass index (BMI) of 60.0 to 69.9 in adult (HCC)   Anemia   Leukocytosis   Essential hypertension   Hypokalemia   Rash   Constipation   Ileus (HCC)  #1 rash -Patient noted to have developed a rash for the past 2 weeks which have worsened, rash with bullous changes and per ID concern for erythema multiforme with no severe sloughing or Nikolsky sign noted. -Patient with no mucosal involvement and no prior HSV infection. -HIV negative.  RPR nonreactive.  Respiratory viral PCR negative. -Mycoplasma IgM pending. -ID recommending general surgery evaluation for punch biopsy. -ID feels could be possibly related to Bentyl. -Also concern for possible paraneoplastic rash. -ID recommending calamine lotion for itching and will need outpatient follow-up with dermatology. -Hydroxyzine as needed.  2.  Severe iron deficiency anemia -Patient with no overt bleeding. -FOBT negative. -Hemoglobin noted at 6.8 on admission (11/14/2022). -Status posttransfusion 2 units packed red blood cells, posttransfusion CBC pending. -Anemia panel with iron level of 12, ferritin of 3, folate of 4.6. -CT abdomen and pelvis with circumferential irregular thickening of the ascending colon proximal to the hepatic flexure with adjacent small mesenteric lymph nodes concerning for colonic neoplasm, potential second site in the sigmoid also raising concern for additional colorectal neoplasm of the mid sigmoid colon less likely colitis.  Fecal impaction also noted. -GI consulted for further evaluation as patient likely needs colonoscopy. -Per GI patient likely needs a 2-day prep. -Continue clear liquids. -Follow H&H, transfusion threshold hemoglobin < 7.  3.  Hypokalemia -Replete. -Magnesium at 2.1. -Repeat labs pending.  4.  Constipation -Likely drug-induced. -Disimpaction. -Placed on Dulcolax suppository,  Senokot-S. -Smog enema. -GI following.  5.  Hypertension -BP soft. -Follow-up.  6.  Morbid obesity -Lifestyle modification -Outpatient follow-up with PCP.   DVT prophylaxis: SCDs Code Status: Full Family Communication: Updated patient.  No family at bedside. Disposition: Likely home once cleared by gastroenterology.  Status is: Inpatient Remains inpatient appropriate because: Severity of illness.  Needing further evaluation for severe iron deficiency anemia.   Consultants:  Gastroenterology: Dr. Paulita Fujita 11/15/2022 ID: Dr. Gale Journey 11/15/2022  Procedures:  Transfusion 2 units packed red blood cells CT abdomen and pelvis 11/15/2022 Abdominal films 11/14/2022 Chest x-ray 11/14/2022  Antimicrobials:  Anti-infectives (From admission, onward)    Start     Dose/Rate Route Frequency Ordered Stop   11/14/22 1545  cefTRIAXone (ROCEPHIN) 1 g in sodium chloride 0.9 % 100 mL IVPB        1 g 200 mL/hr over 30 Minutes Intravenous  Once 11/14/22 1539 11/15/22 0700         Subjective: Patient laying in bed.  Phlebotomist attempting to draw blood.  Patient denies any overt bleeding.  Patient complaining of diffuse itching stating Benadryl not helping.  Objective: Vitals:   11/15/22 0220 11/15/22 0246 11/15/22 0608 11/15/22 1203  BP: (!) 104/58 103/67 101/76   Pulse: 68 70 78   Resp: '17 17 14 17  '$ Temp: (!) 97 F (36.1 C) 97.6 F (36.4 C) 97.7 F (36.5 C)   TempSrc: Oral  Oral   SpO2: 100% 100% 100%   Weight:      Height:        Intake/Output Summary (Last 24 hours) at 11/15/2022 1336 Last data filed at 11/15/2022 0623 Gross per 24 hour  Intake 2088.17 ml  Output 700 ml  Net 1388.17 ml   Filed Weights   11/14/22 1346  Weight: (!) 163.3 kg    Examination:  General exam: Appears calm and comfortable  Respiratory system: Clear to auscultation anterior lung fields.  No wheezes, no crackles, no rhonchi.Marland Kitchen Respiratory effort normal. Cardiovascular system: S1 & S2 heard,  RRR. No JVD, murmurs, rubs, gallops or clicks. No pedal edema. Gastrointestinal system: Abdomen is nondistended, soft and nontender. No organomegaly or masses felt. Normal bowel sounds heard. Central nervous system: Alert and oriented. No focal neurological deficits. Extremities: Symmetric 5 x 5 power. Skin: Diffuse rash on torso and extremities with scattered small bullae with some ruptured.  No oral lesions noted.   Psychiatry: Judgement and insight appear normal. Mood & affect appropriate.                      Data Reviewed: I have personally reviewed following labs and imaging studies  CBC: Recent Labs  Lab 11/14/22 1456  WBC 16.8*  NEUTROABS 12.9*  HGB 6.8*  HCT 25.8*  MCV 62.8*  PLT 762    Basic Metabolic Panel: Recent Labs  Lab 11/14/22 1456 11/14/22 1939  NA 141  --   K 3.3*  --   CL 110  --   CO2 25  --   GLUCOSE 148*  --   BUN 25*  --   CREATININE 0.78  --   CALCIUM 8.3*  --   MG  --  2.1  PHOS  --  2.8    GFR: Estimated Creatinine Clearance: 135.5 mL/min (by C-G formula based on SCr of 0.78 mg/dL).  Liver Function Tests: Recent  Labs  Lab 11/14/22 1456  AST 15  ALT 15  ALKPHOS 60  BILITOT 0.4  PROT 6.6  ALBUMIN 3.2*    CBG: No results for input(s): "GLUCAP" in the last 168 hours.   Recent Results (from the past 240 hour(s))  Blood culture (routine x 2)     Status: None (Preliminary result)   Collection Time: 11/14/22  5:30 PM   Specimen: BLOOD RIGHT FOREARM  Result Value Ref Range Status   Specimen Description   Final    BLOOD RIGHT FOREARM Performed at [facility], [place] [address] [place], [place] [address]    Special Requests   Final    BOTTLES DRAWN AEROBIC AND ANAEROBIC Blood Culture adequate volume Performed at [facility] [address], [place], [place] [address]    Culture   Final    NO GROWTH < 24 HOURS Performed at [facility] [address], [place], [place] [address]     Report Status PENDING  Incomplete  Blood culture (routine x 2)     Status: None (Preliminary result)   Collection Time: 11/14/22  7:39 PM   Specimen: BLOOD  Result Value Ref Range Status   Specimen Description   Final    BLOOD LEFT ANTECUBITAL Performed at [facility] [address], [place], [place] [address]    Special Requests   Final    BOTTLES DRAWN AEROBIC ONLY Blood Culture adequate volume Performed at [facility] [address] [place], [place] [address]    Culture   Final    NO GROWTH < 12 HOURS Performed at  [address], [place], [place] [address]    Report Status PENDING  Incomplete  Respiratory (~20 pathogens) panel by PCR     Status: None   Collection Time: 11/15/22 10:35 AM   Specimen: Nasopharyngeal Swab; Respiratory  Result Value Ref Range Status   Adenovirus NOT DETECTED NOT DETECTED Final   Coronavirus 229E NOT DETECTED NOT DETECTED Final    Comment: (NOTE) The Coronavirus on the Respiratory Panel, DOES NOT test for the novel  Coronavirus (2019 nCoV)    Coronavirus HKU1 NOT DETECTED NOT DETECTED Final   Coronavirus NL63 NOT DETECTED NOT DETECTED Final   Coronavirus OC43 NOT DETECTED NOT DETECTED Final   Metapneumovirus NOT DETECTED NOT DETECTED Final   Rhinovirus / Enterovirus NOT DETECTED NOT DETECTED Final   Influenza A NOT DETECTED NOT DETECTED Final   Influenza B NOT DETECTED NOT DETECTED Final   Parainfluenza Virus 1 NOT DETECTED NOT DETECTED Final   Parainfluenza Virus 2 NOT DETECTED NOT DETECTED Final   Parainfluenza Virus 3 NOT DETECTED NOT DETECTED Final   Parainfluenza Virus 4 NOT DETECTED NOT DETECTED Final   Respiratory Syncytial Virus NOT DETECTED NOT DETECTED Final   Bordetella pertussis NOT DETECTED NOT DETECTED Final   Bordetella Parapertussis NOT DETECTED NOT DETECTED Final   Chlamydophila pneumoniae NOT DETECTED NOT DETECTED Final   Mycoplasma pneumoniae NOT DETECTED NOT  DETECTED Final    Comment: Performed at [facility], [place]. [address], [place],  [address]         Radiology Studies: CT ABDOMEN PELVIS W CONTRAST  Result Date: 11/15/2022 CLINICAL DATA:  Suspected bowel obstruction. Hemoccult positive stool. * Tracking Code: BO * EXAM: CT ABDOMEN AND PELVIS WITH CONTRAST TECHNIQUE: Multidetector CT imaging of the abdomen and pelvis was performed using the standard protocol following bolus administration of intravenous  contrast. RADIATION DOSE REDUCTION: This exam was performed according to the departmental dose-optimization program which includes automated exposure control, adjustment of the mA and/or kV according to patient size and/or use of iterative reconstruction technique. CONTRAST:  174m OMNIPAQUE IOHEXOL 300 MG/ML  SOLN COMPARISON:  October 02, 2022. FINDINGS: Lower chest: Lung bases are clear without effusion or consolidative changes. Hepatobiliary: Limited assessment of the liver which is incompletely imaged. No gross lesion. No biliary duct dilation following cholecystectomy. Study overall is limited by body habitus. Portal vein is patent. Pancreas: Normal, without mass, inflammation or ductal dilatation. Spleen: Top normal size. Adrenals/Urinary Tract: Adrenal glands are normal. Nephrolithiasis on the LEFT with LEFT proximal ureteral calculi in the range of 4-5 mm, perhaps 2 adjacent calculi or 1 irregular calculus displaying mild LEFT proximal ureteral and renal pelvic distension without hydronephrosis is unchanged. Calculi within the kidney also unchanged. 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 No additional ureteral calculi on today's study. No perivesical stranding. Stomach/Bowel: Large volume of stool in the rectum. No substantial perirectal stranding is. Moderate stool elsewhere in the colon. Just proximal to the hepatic flexure in the  ascending colon there is irregular wall thickening with infiltration of the pericolonic fat associated with small pericolonic lymph nodes. In hindsight a subtle finding on previous imaging and persistence and appearance is highly concerning for colorectal neoplasm. Additional site of wall thickening potentially in the sigmoid colon (image 57/3) likewise potentially present and persistent. Stomach without signs of inflammation. Small bowel without signs of obstruction. Lesion in the ascending colon does not appear to obstruct. Appendix is normal. Vascular/Lymphatic: Scattered small lymph nodes throughout the retroperitoneum. No upper abdominal lymphadenopathy no gross mesenteric adenopathy though scattered small lymph nodes throughout the RIGHT colonic mesentery suspicious given other findings on the current study. No pelvic lymphadenopathy. Vascular structures in the abdomen and pelvis are patent grossly on venous phase. Reproductive: Unremarkable. Other: No ascites.  No pneumoperitoneum. Musculoskeletal: Spinal degenerative changes. No acute or destructive bone process. Signs of body wall edema. IMPRESSION: 1. Circumferential irregular thickening of the ascending colon just proximal to the hepatic flexure with adjacent small mesenteric lymph nodes is highly concerning for if not diagnostic of colonic neoplasm. 2. Second site of potential thickening in the sigmoid: Raise the question of an additional colorectal neoplasm of the mid sigmoid colon, less likely colitis. Endoscopic correlation is advised. 3. Signs of fecal impaction. Wall thickness of the rectum raising the question of stercoral proctocolitis. No substantial adjacent stranding at this time. 4. Proximal LEFT ureteral calculus/calculi with mild fullness of the LEFT ureter, stable appearance of intrarenal calculi on the LEFT since previous imaging. 5. Signs of body wall edema 6. Limited assessment of the liver which is incompletely imaged. These results  will be called to the ordering clinician or representative by the Radiologist Assistant, and communication documented in the PACS or [employer] Electronically Signed   By: GZetta BillsM.D.   On: 11/15/2022 08:26   DG Abd 1 View  Result Date: 11/14/2022 CLINICAL DATA:  Constipation, no bowel movement for 3 days. EXAM: ABDOMEN - 1 VIEW COMPARISON:  None Available. FINDINGS: A few distended loops of small bowel are noted in the abdomen measuring up to 3.5 cm. A moderate amount of retained stool is present in the colon and rectum. No radio-opaque calculi or other acute radiographic abnormality are seen. Surgical clips are present in the right upper quadrant.  IMPRESSION: 1. Multiple distended loops of small bowel in the abdomen measuring up to 3.5 cm, possible ileus versus obstruction. 2. Moderate amount of retained stool in the colon. Electronically Signed   By: Brett Fairy M.D.   On: 11/14/2022 20:41   DG Chest Portable 1 View  Result Date: 11/14/2022 CLINICAL DATA:  weakness EXAM: PORTABLE CHEST 1 VIEW COMPARISON:  Chest x-ray 05/20/2011 FINDINGS: The heart and mediastinal contours are within normal limits. Left costophrenic angle and left base/hemidiaphragm collimated off view. No focal consolidation. No pulmonary edema. No pleural effusion. No pneumothorax. No acute osseous abnormality. IMPRESSION: No active disease; however, left costophrenic angle and left base/hemidiaphragm collimated off view. Cannot exclude consolidation at the left base. Recommend repeat PA and lateral view of the chest. Electronically Signed   By: Iven Finn M.D.   On: 11/14/2022 17:07        Scheduled Meds:  bisacodyl  10 mg Rectal Daily   docusate sodium  100 mg Oral BID   hydrOXYzine  25 mg Intramuscular Once   nystatin   Topical q12n4p   polyethylene glycol-electrolytes  4,000 mL Oral Once   senna  1 tablet Oral BID   sodium chloride (PF)       Continuous Infusions:  famotidine (PEPCID) IV 20 mg  (22/97/98 9211)   folic acid 1 mg in sodium chloride 0.9 % 50 mL IVPB 1 mg (11/15/22 1115)     LOS: 1 day    Time spent: 40 minutes    Irine Seal, MD [facility]   To contact the attending provider between 7A-7P or the covering provider during after hours 7P-7A, please log into the web site [email or web address] and access using universal [place] password for that web site. If you do not have the password, please call the hospital operator.  11/15/2022, 1:36 PM

## 2022-11-15 NOTE — Progress Notes (Signed)
RN made aware that PICC will not be placed today.

## 2022-11-15 NOTE — Evaluation (Signed)
Occupational Therapy Evaluation Patient Details Name: Ellen Hill MRN: [id number] DOB: 12/26/1970 Today's Date: 11/15/2022   History of Present Illness 51 y.o. female admitted 11/14/22 for rash and symptomatic anemia.  Past medical history significant of paraplegia as a result of laminectomy, chronic anemia, morbid obesity, hypertension, constipation, neurogenic bladder, UTI   Clinical Impression   The patient required mod assist x2 for bed mobility, including rolling, supine to sit, and sit to supine. She normally performs bed mobility with min assist, and required assist for lower body self-care at her baseline. Her bed mobility and sitting balance EOB appeared limited at least partially by decreased body support provided by speciality air mattress on bed. She will benefit from further OT services to maximize her independence with self care tasks.      Recommendations for follow up therapy are one component of a multi-disciplinary discharge planning process, led by the attending physician.  Recommendations may be updated based on patient status, additional functional criteria and insurance authorization.   Follow Up Recommendations  Home health OT     Assistance Recommended at Discharge Frequent or constant Supervision/Assistance  Patient can return home with the following Assistance with cooking/housework;A lot of help with bathing/dressing/bathroom    Functional Status Assessment  Patient has had a recent decline in their functional status and demonstrates the ability to make significant improvements in function in a reasonable and predictable amount of time.  Equipment Recommendations  None recommended by OT       Precautions / Restrictions Precautions Precautions: Fall Precaution Comments: rash, fluid blisters Restrictions Weight Bearing Restrictions: No      Mobility Bed Mobility Overal bed mobility: Needs Assistance Bed Mobility: Supine to Sit Rolling: Mod  assist, +2 for physical assistance   Supine to sit: +2 for physical assistance, Mod assist Sit to supine: Mod assist, +2 for physical assistance   General bed mobility comments: Pt mobility limited at least partially by decreased body support provided by speciality air mattress on bed        Balance       Sitting balance - Comments: fair to poor, however balance limitations were at least partially due to decreased support provided by speciality air mattress on bed               ADL either performed or assessed with clinical judgement   ADL Overall ADL's : Needs assistance/impaired Eating/Feeding: Independent;Bed level   Grooming: Set up;Bed level           Upper Body Dressing : Minimal assistance;Bed level   Lower Body Dressing: Total assistance;Bed level                       Vision Patient Visual Report: No change from baseline              Pertinent Vitals/Pain Pain Assessment Pain Assessment: No/denies pain     Hand Dominance Right   Extremity/Trunk Assessment Upper Extremity Assessment Upper Extremity Assessment: Overall WFL for tasks assessed (BUE AROM and strength WFL)   Lower Extremity Assessment Lower Extremity Assessment: LLE deficits/detail;RLE deficits/detail RLE Deficits / Details: She has spotty sensation throughout, ankle AROM WFL, strength deficits LLE Deficits / Details: She has spotty sensation throughout.  Generalized weakness, ankle AROM WFL       Communication Communication Communication: No difficulties   Cognition Arousal/Alertness: Awake/alert Behavior During Therapy: WFL for tasks assessed/performed Overall Cognitive Status: Within Functional Limits for tasks assessed  General Comments: Oriented x4, able to follow commands without difficulty      Home Living Family/patient expects to be discharged to:: Private residence Living Arrangements: Children (Daughter and sister) Available Help at  Discharge: Family Type of Home: Other(Comment) (Townhouse)       Home Layout: Two level;Able to live on main level with bedroom/bathroom; She resides on the main level of the home               Home Equipment: BSC/3in1;Wheelchair - manual;Wheelchair - power;Shower seat;Hospital bed, [employer] fast streamer, Warehouse manager)          Prior Functioning/Environment Prior Level of Function : Needs assist             Mobility Comments: She is non-ambulatory and uses a hoyer lift to transfer into and out of her manual wheelchair. She could propel the wheelchair without assistance. A couple months ago, she could assist with slide board transfers, until she stopped using the slide board after sustaining a significant skin tear injury. She has been receiving home health PT. She can normally perform bed mobility with min assist. 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 She performed upper body ADLs without assistance, however required assist for toileting (incontinence), and lower body dressing and bathing.        OT Problem List: Decreased strength;Decreased range of motion;Impaired balance (sitting and/or standing);Decreased activity tolerance;Obesity      OT Treatment/Interventions: Self-care/ADL training;Therapeutic exercise;Therapeutic activities;Energy conservation;Patient/family education;DME and/or AE instruction;Balance training    OT Goals(Current goals can be found in the care plan section) Acute Rehab OT Goals OT Goal Formulation: With patient Time For Goal Achievement: 11/29/22 Potential to Achieve Goals: Fair ADL Goals Pt Will Perform Grooming: with set-up;sitting Pt Will Perform Upper Body Bathing: with set-up;sitting Pt Will Perform Upper Body Dressing: with set-up;sitting Pt/caregiver will Perform Home Exercise Program: Increased strength;Both right and left upper extremity;With theraband;With Supervision  OT Frequency: Min 2X/week     Co-evaluation PT/OT/SLP Co-Evaluation/Treatment: Yes Reason for Co-Treatment: For patient/therapist safety;To address functional/ADL transfers PT goals addressed during session: Mobility/safety with mobility OT goals addressed during session: Strengthening/ROM      AM-PAC OT "6 Clicks" Daily Activity     Outcome Measure Help from another person eating meals?: None Help from another person taking care of personal grooming?: None Help from another person toileting, which includes using toliet, bedpan, or urinal?: Total Help from another person bathing (including washing, rinsing, drying)?: A Lot Help from another person to put on and taking off regular upper body clothing?: A Little Help from another person to put on and taking off regular lower body clothing?: Total 6 Click Score: 15   End of Session Nurse Communication: Mobility status  Activity Tolerance: Patient tolerated treatment well Patient left: in bed;with call bell/phone within reach  OT Visit Diagnosis: Muscle weakness (generalized) (M62.81)                Time: 1610-9604 OT Time Calculation (min): 35 min Charges:  OT General Charges $OT Visit: 1 Visit OT Evaluation $OT Eval Moderate Complexity: 1 Mod   Kenika Sahm L Erick Murin, OTR/L 11/15/2022, 2:13 PM

## 2022-11-15 NOTE — Assessment & Plan Note (Addendum)
KUB suspected for ileus vs obstruction Ordered CT abd/pelvis  Keep NPO If evidence of SBO will need gen Surgery consult and possible NG tube if nausea At this time pt denies

## 2022-11-15 NOTE — Consult Note (Signed)
[facility]    Date of Admission:  11/14/2022     Reason for Consult: rash    Referring Provider: Thompson   Abx: 1 dose ceftriaxone given in Ed 11/23        Assessment: 51 yo female morbidly obese, hx lumbar back surgery 2020 and since bedbound, admitted for progressive pruritic generalized rash  The rash has some bullous changes but predominantly erythema multiforme in nature. There is no clinical evidence of severe sloughing or nikolsky sign  She feels well otherwise and there has been no prodrome or other symptomatology to suggest a prior infection although it might not be clinically apparent to the patient. There is no vaginal/oral sore/ulcer and she has no prior hsv infection. Hiv screen is negative and I have no suspicion this is antiretroviral syndrome. Her lft is normal. Rpr is nonreactive, and the respiratory viral pcr is negative. There is pending mycoplasma igm  While erythema multiforme/bullous rash related to infection usually is due to viral, and with bacterial most commonly mycoplasma, several testings have been negative including rpr/hiv. The mycoplasma igm is pending (if positive potentially could check igg level too but respiratory viral pcr negative and no prodrome) so low suspicion  Maybe this is related to her bentyl  She has ct abd imaging with thickening of colon wall and primary team is asking gi to do colonoscopy in setting of iron deficiency anemia as well. Paraneoplastic rash is possible  With the bullous change I have asked primary team to see if she could get a punch biopsy at one of the EM lesion and bullous lesion edge  Plan: F/u mycoplasma igm (I could do) at this time really no specific treatment even if positive. She had already receive steroid which sometimes is used for severe mucositis reaction by mycoplasma. She has negative rvp pcr and no pna to need doxycycline for I do not see evidence of ten/sjs and her  sx duration without mucosa involvement is reassuring --> she could f/u with outpatient dermatology for further evaluation A skin biopsy would potentially facilitate outpatient evaluation so please discuss with surgery for and edge punch biopsy of one of the newer (bullous) lesion Would do punch biopsy of the EM lesion and the bullous lesion at the edge Trial of calamine lotion for itch Ok to discharge from id standpoint when ready per primary team Discussed with primary team     I spent 75 minute reviewing data/chart, and coordinating care and >50% direct face to face time providing counseling/discussing diagnostics/treatment plan with patient  ------------------------------------------------ Active Problems:   Class 3 severe obesity due to excess calories without serious comorbidity with body mass index (BMI) of 60.0 to 69.9 in adult (HCC)   Anemia   Leukocytosis   Essential hypertension   Hypokalemia   Rash   Constipation   Ileus (HCC)    HPI: Ellen Hill is a 51 y.o. female here for rash  She lives with her 57 yo daughter and a relative. None are sick No recent travel She took bentyl 2 days before rash occurs   Itch red bump on legs that subseuqently spread upward to the trunk/upper extremities. She has been scratching. She thinks lesions are still popping out  Some lesions are bullae like but small  No prodrome uri including myalgia, arthralgia, malaise, sorethroat, runny nose, nasal congestion, cough, n/v/diarrhea  In fact she is constipated  She was seen by ?pcp who  gave her a couple courses of prednisone which she don't think help the lesions  No f/c  No prior rash No hx hsv infection  Afebrile Wbc mildly elevated Normal lft/cr Anemic and ferritin very low at 3  Abd ct showed thickened colonic wall   She had back surgery lumbar area 2020 and has been bedbound since. Can not stand or help with transfer   Family History  Problem Relation Age of  Onset   Hypertension Mother    Cancer Mother        Breast   Breast cancer Mother    Hypertension Father    Cancer Father        Prostate   Heart attack Father    Hypertension Sister    Diabetes Maternal Aunt     Social History   Tobacco Use   Smoking status: Former    Packs/day: 0.30    Years: 1.00    Total pack years: 0.30    Types: Cigarettes    Quit date: 03/18/1999    Years since quitting: 23.6   Smokeless tobacco: Never  Vaping Use   Vaping Use: Never used  Substance Use Topics   Alcohol use: No   Drug use: No    Allergies  Allergen Reactions   Bentyl [Dicyclomine] Rash    Review of Systems: ROS All Other ROS was negative, except mentioned above   Past Medical History:  Diagnosis Date   Abnormal Pap smear    history   Anemia    ASCUS (atypical squamous cells of undetermined significance) on Pap smear 2008   At 36wk of pregnancy ; colpo   Asthma    rarely uses inhaler (seasonal)   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 after loss of parents, is fine   Vaginal Pap smear, abnormal    f/u wnl       Scheduled Meds:  bisacodyl  10 mg Rectal Daily   docusate sodium  100 mg Oral BID   nystatin   Topical q12n4p   senna  1 tablet Oral BID   sodium chloride (PF)       sorbitol, milk of mag, mineral oil, glycerin (SMOG) enema  960 mL Rectal Once   Continuous Infusions:  famotidine (PEPCID) IV 20 mg (37/62/83 1517)   folic acid 1 mg in sodium chloride 0.9 % 50 mL IVPB     PRN Meds:.acetaminophen **OR** acetaminophen, bisacodyl, diphenhydrAMINE, HYDROcodone-acetaminophen, ondansetron **OR** ondansetron  (ZOFRAN) IV, polyethylene glycol, sodium chloride (PF)   OBJECTIVE: Blood pressure 101/76, pulse 78, temperature 97.7 F (36.5 C), temperature source Oral, resp. rate 14, height '5\' 6"'$  (1.676 m), weight (!) 163.3 kg, SpO2 100 %.  Physical Exam  General/constitutional: no distress, pleasant, obese HEENT: Normocephalic, PER, Conj Clear, EOMI, Oropharynx clear Neck supple CV: rrr no mrg Lungs: clear to auscultation, normal respiratory effort Abd: Soft, Nontender Ext: no edema Skin: diffuse all ext and truncal EM rash lesion with scattered small bullae with some already ruptured. No cellulitic change Neuro:  nonfocal; bilateral LE strength weak proximal 3/5 and 3-4/5 distally MSK: no peripheral joint swelling/tenderness/warmth; back spines nontender    Lab Results Lab Results  Component Value Date   WBC 16.8 (H) 11/14/2022   HGB 6.8 (LL) 11/14/2022   HCT 25.8 (L) 11/14/2022   MCV 62.8 (L) 11/14/2022   PLT 344 11/14/2022    Lab Results  Component Value Date   CREATININE 0.78 11/14/2022   BUN 25 (H) 11/14/2022   NA 141 11/14/2022   K 3.3 (L) 11/14/2022   CL 110 11/14/2022   CO2 25 11/14/2022    Lab Results  Component Value Date   ALT 15 11/14/2022   AST 15 11/14/2022   ALKPHOS 60 11/14/2022   BILITOT 0.4 11/14/2022      Microbiology: Recent Results (from the past 240 hour(s))  Blood culture (routine x 2)     Status: None (Preliminary result)   Collection Time: 11/14/22  5:30 PM   Specimen: BLOOD RIGHT FOREARM  Result Value Ref Range Status   Specimen Description   Final    BLOOD RIGHT FOREARM Performed at [facility], [address] [place], [place] [address]    Special Requests   Final    BOTTLES DRAWN AEROBIC AND ANAEROBIC Blood Culture adequate volume Performed at [facility] [address], [place], [place] [address]    Culture   Final    NO GROWTH < 24 HOURS Performed at [facility] [address] [place], [place]  [address]    Report Status PENDING  Incomplete  Blood culture (routine x 2)     Status: None (Preliminary result)   Collection Time: 11/14/22  7:39 PM   Specimen: BLOOD  Result Value Ref Range Status   Specimen Description   Final    BLOOD LEFT ANTECUBITAL Performed at [facility] [address] [place], [place] [address]    Special Requests   Final    BOTTLES DRAWN AEROBIC ONLY Blood Culture adequate volume Performed at [facility] [address] [place], [place] [address]    Culture   Final    NO GROWTH < 12 HOURS Performed at [facility] [address] [place], [place] [address]    Report Status PENDING  Incomplete     Serology:    Imaging: If present, new imagings (plain films, ct scans, and mri) have been personally visualized and interpreted; radiology reports have been reviewed. Decision making incorporated into the Impression / Recommendations.  11/24 ct abd pelv 1. Circumferential irregular thickening of the ascending colon just proximal to the hepatic flexure with adjacent small mesenteric lymph nodes is highly concerning for if not diagnostic of colonic neoplasm. 2. Second site of potential thickening in the sigmoid: Raise the question of an additional colorectal neoplasm of the mid sigmoid colon, less likely colitis. Endoscopic correlation is advised. 3. Signs of fecal impaction. Wall thickness of the rectum raising the question of stercoral proctocolitis. No substantial adjacent stranding at this time. 4. Proximal LEFT ureteral calculus/calculi with mild fullness of the LEFT ureter, stable appearance of intrarenal calculi on the LEFT since previous imaging. 5. Signs of body wall edema 6. Limited assessment of the liver which is incompletely imaged.    Jabier Mutton, [facility] [place] [phone] pager    11/15/2022, 10:31 AM

## 2022-11-15 NOTE — Progress Notes (Signed)
Patient started drinking the contrast at 3:40am. Well tolerated and finished the 1 bottle for just 20 min. Still got another 1 bottle of contrast to drink.

## 2022-11-15 NOTE — Progress Notes (Signed)
Initial Nutrition Assessment  DOCUMENTATION CODES:   Not applicable  INTERVENTION:  - Clear liquid diet per MD - Boost Breeze po TID to support intake while on clear liquid diet. Each supplement provides 250 kcal and 9 grams of protein - Encourage intake as tolerated.   NUTRITION DIAGNOSIS:   Altered GI function related to constipation (concern for ileus) as evidenced by other (comment) (clear liquid diet).  GOAL:   Patient will meet greater than or equal to 90% of their needs  MONITOR:   Diet advancement, PO intake  REASON FOR ASSESSMENT:   Consult Assessment of nutrition requirement/status  ASSESSMENT:   51 y.o. female with medical history significant of paraplegia as a result of laminectomy, chronic anemia, morbid obesity, hypertension, constipation admitted for rash and symptomatic anemia   Patient resting in bed at time of visit. She reports a UBW of 350# and notes her weight tends to fluctuate depending on her appetite. She notes her appetite is about 50/50 if it is good of bad. Patient typically only eats 2 meals a day, breakfast and dinner.  On a good day, patient will eat grits, sausage or bacon, and fruit for breakfast and a home cooked meal of which varies in terms of food for dinner.  Patient reports she is currently hungry but desiring more substantial food than just the ordered clear liquid diet. Discussed ordering something and seeing tolerance to help determine if able to advance diet. Ordered patient her desired meal of grape juice, apple juice, chicken broth, and strawberry fruit ice.   Per GI note today, plan for colonoscopy this admission. Plan for 2 days of bowel prep in addition to enemas and manual disimpaction of significant rectosigmoid constipation.   Medications reviewed and include: Colace, Senokot  Labs reviewed:  -   NUTRITION - FOCUSED PHYSICAL EXAM: Needs done at next visit  Diet Order:   Diet Order             Diet clear liquid Room  service appropriate? Yes; Fluid consistency: Thin  Diet effective now                   EDUCATION NEEDS:  No education needs have been identified at this time  Skin:  Skin Assessment: Reviewed RN Assessment  Last BM:  11/21  Height:  Ht Readings from Last 1 Encounters:  11/14/22 '5\' 6"'$  (1.676 m)   Weight:  Wt Readings from Last 1 Encounters:  11/14/22 (!) 163.3 kg   Ideal Body Weight:  53.2 kg (subtracted 10% for paraplegia)  BMI:  Body mass index is 58.11 kg/m.  Estimated Nutritional Needs:  Kcal:  2250-2400 kcal Protein:  85-95 grams Fluid:  </= 2.2L     Samson Frederic RD, LDN For contact information, refer to [facility].

## 2022-11-15 NOTE — Progress Notes (Signed)
MD notified PICC will not be placed today.

## 2022-11-15 NOTE — Consult Note (Signed)
[facility] Consultation Note  Referring Provider: No ref. provider found Primary Care Physician:  Perth, Firth  Reason for Consultation:  iron-deficiency anemia  HPI: Ellen Hill is a 51 y.o. female seeking care for rash both arms.  Patient is paraplegic and bedbound and obese.  She was found to have significant iron-deficiency anemia with multiple abnormalities in colon on CT raising concern for fecal impaction as well as possible multifocal colon cancer.  She has no blood in stool.  Chronic constipation.  No prior colonoscopy.  No abdominal pain.   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 after loss of parents, is fine   Vaginal Pap smear, abnormal    f/u wnl    Past Surgical History:  Procedure Laterality Date   CERVICAL POLYPECTOMY  05/01/2012   Procedure: CERVICAL POLYPECTOMY;  Surgeon: Betsy Coder, MD;  Location: [facility];  Service: Gynecology;  Laterality: N/A;   CESAREAN SECTION  07/04/2007   CHOLECYSTECTOMY  07/06/2013   CHOLECYSTECTOMY N/A 07/06/2013   Procedure: LAPAROSCOPIC CHOLECYSTECTOMY ;  Surgeon: Gayland Curry, MD;  Location: [place];  Service: General;  Laterality: N/A;  attempted cholangiogram   fibroidecto     fibroidectomy  07/04/2007   LUMBAR LAMINECTOMY/DECOMPRESSION MICRODISCECTOMY N/A 01/19/2019   Procedure: Thoracic Nine-Lumbar One  Decompressive Laminectomy;  Surgeon: Earnie Larsson, MD;  Location: [place];  Service: Neurosurgery;  Laterality: N/A;  posterior   MYOMECTOMY  07/04/08   OPERATIVE HYSTEROSCOPY  05/01/2012   no menses since that time   TUBAL LIGATION  07/04/2007    Prior to Admission medications   Medication Sig Start Date End Date Taking? Authorizing Provider  baclofen (LIORESAL) 10 MG tablet TAKE 1 TABLET TWICE A DAY AT 8 AM AND 1 PM WITH 5 MG TABLET. Patient taking differently: Take 10 mg by mouth 2 (two) times daily. 01/23/22  Yes Billie Ruddy, MD  baclofen (LIORESAL) 20 MG tablet TAKE 1 TABLET EVERY DAY AT 6 PM Patient taking differently: Take 20 mg by mouth at bedtime. 09/07/21  Yes Billie Ruddy, MD  cetirizine (ZYRTEC) 10 MG tablet Take 10 mg by mouth daily.   Yes [provider]  diphenhydrAMINE (BENADRYL) 25 mg capsule Take 25 mg by mouth every 6 (six) hours as needed for itching or allergies.   Yes [provider]  diphenhydrAMINE-zinc acetate (BENADRYL) cream Apply 1 Application topically 3 (three) times daily as needed for itching.   Yes [provider]  gabapentin (NEURONTIN) 600 MG tablet Take 600 mg by mouth 3 (three) times daily.   Yes [provider]  hydrochlorothiazide (HYDRODIURIL) 25 MG tablet TAKE 1 TABLET BY MOUTH EVERY DAY Patient taking differently: Take 25 mg  by mouth daily. 11/07/21  Yes Billie Ruddy, MD  hydrocortisone cream 1 % Apply 1 Application topically 2 (two) times daily as needed for itching.   Yes [provider]  hydrOXYzine (ATARAX) 25 MG tablet Take 25 mg by mouth 2 (two) times daily as needed for itching.   Yes [provider]  nystatin (MYCOSTATIN/NYSTOP) powder APPLY TOPICALLY 2 TIMES DAILY AT 12 NOON AND 4 PM. Patient taking differently: Apply 1 Application topically 2 times daily at 12 noon and 4 pm. 09/26/21  Yes Billie Ruddy, MD  oxyCODONE-acetaminophen (PERCOCET/ROXICET) 5-325 MG tablet Take 1 tablet by  mouth every 8 (eight) hours as needed for severe pain.   Yes [provider]  Potassium Chloride ER 20 MEQ TBCR Take 20 mEq by mouth daily.   Yes [provider]  tamsulosin (FLOMAX) 0.4 MG CAPS capsule TAKE 1 CAPSULE (0.4 MG TOTAL) BY MOUTH DAILY AFTER SUPPER. 03/14/21  Yes Billie Ruddy, MD  traZODone (DESYREL) 50 MG tablet Take 50 mg by mouth at bedtime as needed for sleep.   Yes [provider]  omeprazole (PRILOSEC) 20 MG capsule Take 1 capsule (20 mg total) by mouth daily. Patient not taking: Reported on 11/15/2022 10/03/22   Charlesetta Shanks, MD    Current Facility-Administered Medications  Medication Dose Route Frequency Provider Last Rate Last Admin   acetaminophen (TYLENOL) tablet 650 mg  650 mg Oral Q6H PRN Toy Baker, MD       Or   acetaminophen (TYLENOL) suppository 650 mg  650 mg Rectal Q6H PRN Doutova, Anastassia, MD       bisacodyl (DULCOLAX) suppository 10 mg  10 mg Rectal Daily PRN Doutova, Anastassia, MD       bisacodyl (DULCOLAX) suppository 10 mg  10 mg Rectal Daily Eugenie Filler, MD   10 mg at 11/15/22 1122   diphenhydrAMINE (BENADRYL) injection 25 mg  25 mg Intravenous Q6H PRN Doutova, Nyoka Lint, MD   25 mg at 11/15/22 1203   docusate sodium (COLACE) capsule 100 mg  100 mg Oral BID Doutova, Anastassia, MD   100 mg at 11/15/22 1122   famotidine (PEPCID) IVPB 20 mg premix  20 mg Intravenous Q12H Doutova, Anastassia, MD 100 mL/hr at 11/15/22 0606 20 mg at 93/26/71 2458   folic acid 1 mg in sodium chloride 0.9 % 50 mL IVPB  1 mg Intravenous Daily Eugenie Filler, MD 100.4 mL/hr at 11/15/22 1115 1 mg at 11/15/22 1115   HYDROcodone-acetaminophen (NORCO/VICODIN) 5-325 MG per tablet 1-2 tablet  1-2 tablet Oral Q4H PRN Toy Baker, MD       nystatin (MYCOSTATIN/NYSTOP) topical powder   Topical q12n4p Doutova, Anastassia, MD       ondansetron (ZOFRAN) tablet 4 mg  4 mg Oral Q6H PRN Doutova, Anastassia, MD       Or   ondansetron  (ZOFRAN) injection 4 mg  4 mg Intravenous Q6H PRN Doutova, Anastassia, MD       polyethylene glycol (MIRALAX / GLYCOLAX) packet 17 g  17 g Oral Daily PRN Doutova, Anastassia, MD       senna (SENOKOT) tablet 8.6 mg  1 tablet Oral BID Doutova, Anastassia, MD   8.6 mg at 11/15/22 1122   sodium chloride (PF) 0.9 % injection             Allergies as of 11/14/2022 - Review Complete 11/14/2022  Allergen Reaction Noted   Bentyl [dicyclomine] Rash 11/14/2022    Family History  Problem Relation Age  of Onset   Hypertension Mother    Cancer Mother        Breast   Breast cancer Mother    Hypertension Father    Cancer Father        Prostate   Heart attack Father    Hypertension Sister    Diabetes Maternal Aunt     Social History   Socioeconomic History   Marital status: Single    Spouse name: Not on file   Number of children: Not on file   Years of education: Not on file   Highest education level: Not on file  Occupational History   Not on file  Tobacco Use   Smoking status: Former    Packs/day: 0.30    Years: 1.00    Total pack years: 0.30    Types: Cigarettes    Quit date: 03/18/1999    Years since quitting: 23.6   Smokeless tobacco: Never  Vaping Use   Vaping Use: Never used  Substance and Sexual Activity   Alcohol use: No   Drug use: No   Sexual activity: Not Currently    Birth control/protection: Surgical  Other Topics Concern   Not on file  Social History Narrative   Not on file   Social Determinants of Health   Financial Resource Strain: Not on file  Food Insecurity: No Food Insecurity (11/15/2022)   Hunger Vital Sign    Worried About Running Out of Food in the Last Year: Never true    Ran Out of Food in the Last Year: Never true  Transportation Needs: No Transportation Needs (11/14/2022)   PRAPARE - Hydrologist (Medical): No    Lack of Transportation (Non-Medical): No  Physical Activity: Not on file  Stress: Not on file   Social Connections: Not on file  Intimate Partner Violence: Not At Risk (11/14/2022)   Humiliation, Afraid, Rape, and Kick questionnaire    Fear of Current or Ex-Partner: No    Emotionally Abused: No    Physically Abused: No    Sexually Abused: No    Review of Systems: As per HPI, all others negative  Physical Exam: Vital signs in last 24 hours: Temp:  [97 F (36.1 C)-98.1 F (36.7 C)] 97.7 F (36.5 C) (11/24 0608) Pulse Rate:  [68-134] 78 (11/24 0608) Resp:  [14-25] 17 (11/24 1203) BP: (101-152)/(51-81) 101/76 (11/24 0608) SpO2:  [98 %-100 %] 100 % (11/24 5732) Weight:  [163.3 kg] 163.3 kg (11/23 1346) Last BM Date : 11/12/22 General:   Alert,  obese, bedbound, paraplegic (moves arms but not legs) Head:  Normocephalic and atraumatic. Eyes:  Sclera clear, no icterus.   Conjunctiva pink. Ears:  Normal auditory acuity. Nose:  No deformity, discharge,  or lesions. Mouth:  No deformity or lesions.  Oropharynx pink & moist. Neck:  Supple; no masses or thyromegaly. Lungs:  No respiratory distress Abdomen:  Soft, obese, non-tender, No masses, hepatosplenomegaly or hernias noted. Normal bowel sounds, without guarding, and without rebound.     Msk:  Symmetrical without gross deformities. Normal posture. Pulses:  Normal pulses noted. Extremities:  Pressure sore bandages on both heels, generalized body edema and rash, without clubbing Neurologic:  Alert and  oriented x4;  grossly normal neurologically. Skin:  Maculopapular rash bilateral arms with some interspersed small vesicles ~ 5 mm, otherwise intact without significant lesions or rashes. Psych:  Alert and cooperative. Normal mood and affect.   Lab Results: Recent Labs    11/14/22  1456  WBC 16.8*  HGB 6.8*  HCT 25.8*  PLT 344   BMET Recent Labs    11/14/22 1456  NA 141  K 3.3*  CL 110  CO2 25  GLUCOSE 148*  BUN 25*  CREATININE 0.78  CALCIUM 8.3*   LFT Recent Labs    11/14/22 1456  PROT 6.6  ALBUMIN 3.2*   AST 15  ALT 15  ALKPHOS 60  BILITOT 0.4   PT/INR Recent Labs    11/14/22 1939  LABPROT 15.5*  INR 1.2    Studies/Results: CT ABDOMEN PELVIS W CONTRAST  Result Date: 11/15/2022 CLINICAL DATA:  Suspected bowel obstruction. Hemoccult positive stool. * Tracking Code: BO * EXAM: CT ABDOMEN AND PELVIS WITH CONTRAST TECHNIQUE: Multidetector CT imaging of the abdomen and pelvis was performed using the standard protocol following bolus administration of intravenous contrast. RADIATION DOSE REDUCTION: This exam was performed according to the departmental dose-optimization program which includes automated exposure control, adjustment of the mA and/or kV according to patient size and/or use of iterative reconstruction technique. CONTRAST:  145m OMNIPAQUE IOHEXOL 300 MG/ML  SOLN COMPARISON:  October 02, 2022. FINDINGS: Lower chest: Lung bases are clear without effusion or consolidative changes. Hepatobiliary: Limited assessment of the liver which is incompletely imaged. No gross lesion. No biliary duct dilation following cholecystectomy. Study overall is limited by body habitus. Portal vein is patent. Pancreas: Normal, without mass, inflammation or ductal dilatation. Spleen: Top normal size. Adrenals/Urinary Tract: Adrenal glands are normal. Nephrolithiasis on the LEFT with LEFT proximal ureteral calculi in the range of 4-5 mm, perhaps 2 adjacent calculi or 1 irregular calculus displaying mild LEFT proximal ureteral and renal pelvic distension without hydronephrosis is unchanged. Calculi within the kidney also unchanged. 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 No additional ureteral calculi on today's study. No perivesical stranding. Stomach/Bowel: Large volume of stool in the rectum. No substantial perirectal stranding is. Moderate stool elsewhere in the colon. Just proximal to the hepatic flexure in the  ascending colon there is irregular wall thickening with infiltration of the pericolonic fat associated with small pericolonic lymph nodes. In hindsight a subtle finding on previous imaging and persistence and appearance is highly concerning for colorectal neoplasm. Additional site of wall thickening potentially in the sigmoid colon (image 57/3) likewise potentially present and persistent. Stomach without signs of inflammation. Small bowel without signs of obstruction. Lesion in the ascending colon does not appear to obstruct. Appendix is normal. Vascular/Lymphatic: Scattered small lymph nodes throughout the retroperitoneum. No upper abdominal lymphadenopathy no gross mesenteric adenopathy though scattered small lymph nodes throughout the RIGHT colonic mesentery suspicious given other findings on the current study. No pelvic lymphadenopathy. Vascular structures in the abdomen and pelvis are patent grossly on venous phase. Reproductive: Unremarkable. Other: No ascites.  No pneumoperitoneum. Musculoskeletal: Spinal degenerative changes. No acute or destructive bone process. Signs of body wall edema. IMPRESSION: 1. Circumferential irregular thickening of the ascending colon just proximal to the hepatic flexure with adjacent small mesenteric lymph nodes is highly concerning for if not diagnostic of colonic neoplasm. 2. Second site of potential thickening in the sigmoid: Raise the question of an additional colorectal neoplasm of the mid sigmoid colon, less likely colitis. Endoscopic correlation is advised. 3. Signs of fecal impaction. Wall thickness of the rectum raising the question of stercoral proctocolitis. No substantial adjacent stranding at this time. 4. Proximal LEFT ureteral calculus/calculi with mild fullness of the LEFT  ureter, stable appearance of intrarenal calculi on the LEFT since previous imaging. 5. Signs of body wall edema 6. Limited assessment of the liver which is incompletely imaged. These results  will be called to the ordering clinician or representative by the Radiologist Assistant, and communication documented in the PACS or [employer]. Electronically Signed   By: Zetta Bills M.D.   On: 11/15/2022 08:26   DG Abd 1 View  Result Date: 11/14/2022 CLINICAL DATA:  Constipation, no bowel movement for 3 days. EXAM: ABDOMEN - 1 VIEW COMPARISON:  None Available. FINDINGS: A few distended loops of small bowel are noted in the abdomen measuring up to 3.5 cm. A moderate amount of retained stool is present in the colon and rectum. No radio-opaque calculi or other acute radiographic abnormality are seen. Surgical clips are present in the right upper quadrant. IMPRESSION: 1. Multiple distended loops of small bowel in the abdomen measuring up to 3.5 cm, possible ileus versus obstruction. 2. Moderate amount of retained stool in the colon. Electronically Signed   By: Brett Fairy M.D.   On: 11/14/2022 20:41   DG Chest Portable 1 View  Result Date: 11/14/2022 CLINICAL DATA:  weakness EXAM: PORTABLE CHEST 1 VIEW COMPARISON:  Chest x-ray 05/20/2011 FINDINGS: The heart and mediastinal contours are within normal limits. Left costophrenic angle and left base/hemidiaphragm collimated off view. No focal consolidation. No pulmonary edema. No pleural effusion. No pneumothorax. No acute osseous abnormality. IMPRESSION: No active disease; however, left costophrenic angle and left base/hemidiaphragm collimated off view. Cannot exclude consolidation at the left base. Recommend repeat PA and lateral view of the chest. Electronically Signed   By: Iven Finn M.D.   On: 11/14/2022 17:07    Impression:   Iron deficiency anemia. Abnormal CT scan:  couple different abnormalities in colon; also mild left ureteral prominence with proximal left ureterolithiasis. Rash. Paraplegia (waist-->down), bedbound. Pressure sores. Obesity. Chronic constipation.  Plan:   Defer management of rash to primary team.  I  would recommend at least phone discussion with urology to assess whether anything needs to be done about patient's left ureterolithiasis during this hospitalization. Colonoscopy planned this admission.  Would ask anesthesia ahead-of-time whether her case can be done in our endoscopy unit or whether case will have to be done in OR.  Bowel preparation for this patient will be a challenging process.  Will administer two days of prep, which I think is required to facilitate complete emptying.  She is also getting enema and manual disimpaction for her significant rectosigmoid constipation. Clear liquid diet in the interim is ok. [facility] will follow.   LOS: 1 day   Lakecia Deschamps M  11/15/2022, 1:16 PM  Cell [phone] If no answer or after 5 PM call [phone]

## 2022-11-15 NOTE — Evaluation (Signed)
Physical Therapy Evaluation Patient Details Name: Ellen Hill MRN: [id number] DOB: [birth year] Today's Date: 11/15/2022  History of Present Illness  51 y.o. female admitted 11/14/22 for rash and symptomatic anemia.  Past medical history significant of paraplegia as a result of laminectomy, chronic anemia, morbid obesity, hypertension, constipation, neurogenic bladder, UTI  Clinical Impression  Pt admitted with above diagnosis.  Pt currently with functional limitations due to the deficits listed below (see PT Problem List). Pt will benefit from skilled PT to increase their independence and safety with mobility to allow discharge to the venue listed below.   Pt reports using a hoyer lift for OOB at home. Pt has both manual and motorized w/c however typically uses manual and reports ability to self propel.  Will continue to assist pt with safe mobility during acute stay in preparation for return home.        Recommendations for follow up therapy are one component of a multi-disciplinary discharge planning process, led by the attending physician.  Recommendations may be updated based on patient status, additional functional criteria and insurance authorization.  Follow Up Recommendations Home health PT      Assistance Recommended at Discharge Intermittent Supervision/Assistance  Patient can return home with the following  A little help with walking and/or transfers    Equipment Recommendations None recommended by PT  Recommendations for Other Services       Functional Status Assessment Patient has had a recent decline in their functional status and demonstrates the ability to make significant improvements in function in a reasonable and predictable amount of time.     Precautions / Restrictions Precautions Precautions: Fall Precaution Comments: rash, fluid blisters Restrictions Weight Bearing Restrictions: No      Mobility  Bed Mobility Overal bed mobility: Needs  Assistance Bed Mobility: Supine to Sit Rolling: Mod assist, +2 for physical assistance   Supine to sit: +2 for physical assistance, Mod assist Sit to supine: Mod assist, +2 for physical assistance   General bed mobility comments: Pt mobility limited at least partially by decreased body support provided of speciality air mattress on bed (air mattress sinking, new bed to arrive sometime today); pt with good ability to assist with upper body    Transfers                        Ambulation/Gait                  Stairs            Wheelchair Mobility    Modified Rankin (Stroke Patients Only)       Balance   Sitting-balance support: No upper extremity supported, Feet supported Sitting balance-Leahy Scale: Good                                       Pertinent Vitals/Pain Pain Assessment Pain Assessment: No/denies pain    Home Living Family/patient expects to be discharged to:: Private residence Living Arrangements: Children (Daughter and sister) Available Help at Discharge: Family Type of Home: Other(Comment) (Townhouse)         Home Layout: Two level;Able to live on main level with bedroom/bathroom Home Equipment: BSC/3in1;Wheelchair - manual;Wheelchair - power;Shower seat;Hospital bed Optician, dispensing, Warehouse manager)      Prior Function Prior Level of Function : Needs assist  Mobility Comments: She is non-ambulatory and uses a hoyer lift to transfer into and out of her manual wheelchair. She could propel the wheelchair without assistance. A couple months ago, she could assist with slide board transfers, until she stopped using the slide board after sustaining a significant skin tear injury. She has been receiving home health PT. She can normally perform bed mobility with min assist. 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 She performed upper body ADLs without assistance, however  required assist for toileting (incontinence), and lower body dressing and bathing.     Hand Dominance   Dominant Hand: Right    Extremity/Trunk Assessment   Upper Extremity Assessment Upper Extremity Assessment: Overall WFL for tasks assessed (BUE AROM and strength WFL)    Lower Extremity Assessment Lower Extremity Assessment: RLE deficits/detail;LLE deficits/detail RLE Deficits / Details: pt reports variable sensation, grossly 2+/5 strength observed LLE Deficits / Details: pt reports variable sensation, grossly 2+/5 strength observed       Communication   Communication: No difficulties  Cognition Arousal/Alertness: Awake/alert Behavior During Therapy: WFL for tasks assessed/performed Overall Cognitive Status: Within Functional Limits for tasks assessed                                          General Comments      Exercises     Assessment/Plan    PT Assessment Patient needs continued PT services  PT Problem List Decreased mobility;Decreased strength;Decreased activity tolerance;Obesity       PT Treatment Interventions DME instruction;Therapeutic exercise;Balance training;Functional mobility training;Therapeutic activities;Patient/family education;Wheelchair mobility training    PT Goals (Current goals can be found in the Care Plan section)  Acute Rehab PT Goals PT Goal Formulation: With patient Time For Goal Achievement: 12/06/22 Potential to Achieve Goals: Good    Frequency Min 2X/week     Co-evaluation PT/OT/SLP Co-Evaluation/Treatment: Yes Reason for Co-Treatment: For patient/therapist safety;To address functional/ADL transfers PT goals addressed during session: Mobility/safety with mobility OT goals addressed during session: Strengthening/ROM       AM-PAC PT "6 Clicks" Mobility  Outcome Measure Help needed turning from your back to your side while in a flat bed without using bedrails?: A Lot Help needed moving from lying on your  back to sitting on the side of a flat bed without using bedrails?: A Lot Help needed moving to and from a bed to a chair (including a wheelchair)?: Total Help needed standing up from a chair using your arms (e.g., wheelchair or bedside chair)?: Total Help needed to walk in hospital room?: Total Help needed climbing 3-5 steps with a railing? : Total 6 Click Score: 8    End of Session   Activity Tolerance: Patient tolerated treatment well Patient left: in bed;with call bell/phone within reach Nurse Communication: Mobility status PT Visit Diagnosis: Muscle weakness (generalized) (M62.81);Other abnormalities of gait and mobility (R26.89)    Time: 1217-1252 PT Time Calculation (min) (ACUTE ONLY): 35 min   Charges:   PT Evaluation $PT Eval Low Complexity: 1 Low PT Treatments $Therapeutic Activity: 8-22 mins       Jannette Spanner PT, DPT Physical Therapist Acute Rehabilitation Services Preferred contact method: Secure Chat Weekend Pager Only: [phone] Office: [place] 11/15/2022, 3:54 PM

## 2022-11-16 ENCOUNTER — Encounter (HOSPITAL_COMMUNITY): Payer: Self-pay | Admitting: Internal Medicine

## 2022-11-16 DIAGNOSIS — I10 Essential (primary) hypertension: Secondary | ICD-10-CM | POA: Diagnosis not present

## 2022-11-16 DIAGNOSIS — E876 Hypokalemia: Secondary | ICD-10-CM | POA: Diagnosis not present

## 2022-11-16 DIAGNOSIS — R21 Rash and other nonspecific skin eruption: Secondary | ICD-10-CM | POA: Diagnosis not present

## 2022-11-16 DIAGNOSIS — D649 Anemia, unspecified: Secondary | ICD-10-CM | POA: Diagnosis not present

## 2022-11-16 LAB — CBC WITH DIFFERENTIAL/PLATELET
Abs Immature Granulocytes: 0.16 10*3/uL — ABNORMAL HIGH (ref 0.00–0.07)
Basophils Absolute: 0 10*3/uL (ref 0.0–0.1)
Basophils Relative: 0 %
Eosinophils Absolute: 1.8 10*3/uL — ABNORMAL HIGH (ref 0.0–0.5)
Eosinophils Relative: 9 %
HCT: 39.3 % (ref 36.0–46.0)
Hemoglobin: 11.4 g/dL — ABNORMAL LOW (ref 12.0–15.0)
Immature Granulocytes: 1 %
Lymphocytes Relative: 14 %
Lymphs Abs: 2.8 10*3/uL (ref 0.7–4.0)
MCH: 19.6 pg — ABNORMAL LOW (ref 26.0–34.0)
MCHC: 29 g/dL — ABNORMAL LOW (ref 30.0–36.0)
MCV: 67.6 fL — ABNORMAL LOW (ref 80.0–100.0)
Monocytes Absolute: 1.2 10*3/uL — ABNORMAL HIGH (ref 0.1–1.0)
Monocytes Relative: 6 %
Neutro Abs: 14.9 10*3/uL — ABNORMAL HIGH (ref 1.7–7.7)
Neutrophils Relative %: 70 %
Platelets: 358 10*3/uL (ref 150–400)
RBC: 5.81 MIL/uL — ABNORMAL HIGH (ref 3.87–5.11)
RDW: 29.4 % — ABNORMAL HIGH (ref 11.5–15.5)
WBC: 20.8 10*3/uL — ABNORMAL HIGH (ref 4.0–10.5)
nRBC: 0.1 % (ref 0.0–0.2)

## 2022-11-16 LAB — COMPREHENSIVE METABOLIC PANEL
ALT: 15 U/L (ref 0–44)
AST: 16 U/L (ref 15–41)
Albumin: 3.2 g/dL — ABNORMAL LOW (ref 3.5–5.0)
Alkaline Phosphatase: 57 U/L (ref 38–126)
Anion gap: 11 (ref 5–15)
BUN: 15 mg/dL (ref 6–20)
CO2: 23 mmol/L (ref 22–32)
Calcium: 8.3 mg/dL — ABNORMAL LOW (ref 8.9–10.3)
Chloride: 105 mmol/L (ref 98–111)
Creatinine, Ser: 0.76 mg/dL (ref 0.44–1.00)
GFR, Estimated: >60 mL/min (ref >60)
Glucose, Bld: 88 mg/dL (ref 70–99)
Potassium: 3.6 mmol/L (ref 3.5–5.1)
Sodium: 139 mmol/L (ref 135–145)
Total Bilirubin: 0.9 mg/dL (ref 0.3–1.2)
Total Protein: 6.5 g/dL (ref 6.5–8.1)

## 2022-11-16 LAB — PREALBUMIN: Prealbumin: 15 mg/dL — ABNORMAL LOW (ref 18–38)

## 2022-11-16 LAB — MAGNESIUM: Magnesium: 2.1 mg/dL (ref 1.7–2.4)

## 2022-11-16 MED ORDER — SODIUM CHLORIDE 0.9% FLUSH
10.0000 mL | INTRAVENOUS | Status: DC | PRN
  Administered 2022-11-18: 20 mL

## 2022-11-16 MED ORDER — LORATADINE 10 MG PO TABS
10.0000 mg | ORAL_TABLET | Freq: Every day | ORAL | Status: DC
  Administered 2022-11-16 – 2022-12-06 (×20): 10 mg via ORAL
  Filled 2022-11-16 (×20): qty 1

## 2022-11-16 MED ORDER — GABAPENTIN 300 MG PO CAPS
600.0000 mg | ORAL_CAPSULE | Freq: Three times a day (TID) | ORAL | Status: DC
  Administered 2022-11-16 – 2022-12-06 (×60): 600 mg via ORAL
  Filled 2022-11-16 (×60): qty 2

## 2022-11-16 MED ORDER — TRAZODONE HCL 50 MG PO TABS
50.0000 mg | ORAL_TABLET | Freq: Every evening | ORAL | Status: DC | PRN
  Administered 2022-11-19 – 2022-12-05 (×15): 50 mg via ORAL
  Filled 2022-11-16 (×17): qty 1

## 2022-11-16 MED ORDER — OXYCODONE-ACETAMINOPHEN 5-325 MG PO TABS
1.0000 | ORAL_TABLET | Freq: Three times a day (TID) | ORAL | Status: DC | PRN
  Administered 2022-11-17 – 2022-11-18 (×2): 1 via ORAL
  Filled 2022-11-16 (×3): qty 1

## 2022-11-16 MED ORDER — CHLORHEXIDINE GLUCONATE CLOTH 2 % EX PADS
6.0000 | MEDICATED_PAD | Freq: Every day | CUTANEOUS | Status: DC
  Administered 2022-11-17 – 2022-12-06 (×17): 6 via TOPICAL

## 2022-11-16 NOTE — Progress Notes (Signed)
PROGRESS NOTE    Kearstyn Avitia [facility]  LKG:[id number] DOB: 1971-12-05 DOA: 11/14/2022 PCP: [facility]    Chief Complaint  Patient presents with   Allergic Reaction    Brief Narrative:  Patient is a 51 year old female history of paraplegia as a result of laminectomy, history of chronic anemia, morbid obesity, hypertension, constipation presented with a rash ongoing x 2 weeks which had developed on her anterior trunk and extremities and abdomen started on doxycycline and treated with steroids however no significant improvement.  Patient noted not to have any mucosal involvement or rash of the palms of the hands and the feet no involvement of the posterior body.  Patient also noted to have a history of drug-induced constipation.  Patient noted on 10/30/2022 to be started on Bentyl however reported rash started on her legs progressed up to 2 days after starting Bentyl and using steroids only made rash worse and spread with a burning and itching sensation.  Patient noted to be followed by GI for chronic anemia and was to undergo endoscopy in the OR unfortunately developed rash and unable to get this done.  Patient presented to the ED, noted to have a significant anemia with a hemoglobin of 6.8 and admitted.  Patient transfused 2 units packed red blood cells.  Posttransfusion CBC pending.  CT abdomen and pelvis done due to concern for ileus with circumferential irregular thickening of the ascending colon proximal to the hepatic flexure, small mesenteric lymph nodes findings concerning for colonic neoplasm, second potential site of thickening in the sigmoid also raising concern for colorectal neoplasm, significant fecal impaction, left ureteral calculus/calculi with mild fullness of the left ureter stable appearance of intra renal calculi on the left since previous imaging, signs of body wall edema.  GI consulted for further evaluation.  ID also consulted for evaluation of rash.   Assessment  & Plan:   Active Problems:   Class 3 severe obesity due to excess calories without serious comorbidity with body mass index (BMI) of 60.0 to 69.9 in adult (HCC)   Anemia   Leukocytosis   Essential hypertension   Hypokalemia   Bullous rash   Constipation   Ileus (HCC)   Erythema multiforme   Symptomatic anemia  #1 rash -Patient noted to have developed a rash for the past 2 weeks which have worsened, rash with bullous changes and per ID concern for erythema multiforme with no severe sloughing or Nikolsky sign noted. -Patient with no mucosal involvement and no prior HSV infection. -HIV negative.  RPR nonreactive.  Respiratory viral PCR negative. -Mycoplasma IgM pending. -ID recommending general surgery evaluation for punch biopsy. -ID feels could be possibly related to Bentyl. -Also concern for possible paraneoplastic rash. -ID recommending calamine lotion for itching and will need outpatient follow-up with dermatology. -Hydroxyzine as needed.  2.  Severe iron deficiency anemia -Patient with no overt bleeding. -FOBT negative. -Hemoglobin noted at 6.8 on admission (11/14/2022). -Status posttransfusion 2 units packed red blood cells, hemoglobin this morning at 11.4. -Anemia panel with iron level of 12, ferritin of 3, folate of 4.6. -CT abdomen and pelvis with circumferential irregular thickening of the ascending colon proximal to the hepatic flexure with adjacent small mesenteric lymph nodes concerning for colonic neoplasm, potential second site in the sigmoid also raising concern for additional colorectal neoplasm of the mid sigmoid colon less likely colitis.  Fecal impaction also noted. -GI consulted for further evaluation and patient started on a bowel prep for colonoscopy and likely needs 2-day  prep per GI recommendations. -Continue clear liquids. -Follow H&H, transfusion threshold hemoglobin < 7.  3.  Hypokalemia -Repleted. -Potassium at 3.6. -Magnesium at 2.1.  4.   Constipation -Likely drug-induced. -Disimpaction ordered. -Patient was placed on Dulcolax suppository and Senokot as also received a smog enema.   -Currently receiving bowel prep for colonoscopy.  -GI following.  5.  Hypertension -BP soft but improving. -Follow.  6.  Morbid obesity -Lifestyle modification -Outpatient follow-up with PCP.   DVT prophylaxis: SCDs Code Status: Full Family Communication: Updated patient.  No family at bedside. Disposition: Likely home once cleared by gastroenterology.  Status is: Inpatient Remains inpatient appropriate because: Severity of illness.  Needing further evaluation for severe iron deficiency anemia.   Consultants:  Gastroenterology: Dr. Paulita Fujita 11/15/2022 ID: Dr. Gale Journey 11/15/2022  Procedures:  Transfusion 2 units packed red blood cells CT abdomen and pelvis 11/15/2022 Abdominal films 11/14/2022 Chest x-ray 11/14/2022  Antimicrobials:  Anti-infectives (From admission, onward)    Start     Dose/Rate Route Frequency Ordered Stop   11/14/22 1545  cefTRIAXone (ROCEPHIN) 1 g in sodium chloride 0.9 % 100 mL IVPB        1 g 200 mL/hr over 30 Minutes Intravenous  Once 11/14/22 1539 11/15/22 0700         Subjective: Sitting up in bed.  States some improvement with itching with calamine lotion and hydroxyzine.  Denies any chest pain.  No shortness of breath.  Stated unable to get labs around 5 AM this morning.  States has been having bowel movement after enema.  Currently getting bowel prep.   Objective: Vitals:   11/15/22 1203 11/15/22 1429 11/15/22 1954 11/16/22 0454  BP:  (!) 108/45 (!) 101/46 138/62  Pulse:  100 (!) 103 (!) 102  Resp: '17 18 18 18  '$ Temp:  98.4 F (36.9 C) 98.4 F (36.9 C) 98.2 F (36.8 C)  TempSrc:  Oral Oral Oral  SpO2:  100% 100% 100%  Weight:      Height:        Intake/Output Summary (Last 24 hours) at 11/16/2022 1049 Last data filed at 11/16/2022 0500 Gross per 24 hour  Intake 1000 ml  Output 350 ml   Net 650 ml    Filed Weights   11/14/22 1346  Weight: (!) 163.3 kg    Examination:  General exam: Appears calm and comfortable  Respiratory system: Lungs clear to auscultation bilaterally.  No wheezes, no crackles, no rhonchi.  Fair air movement.  Speaking in full sentences.   Cardiovascular system: Regular rate rhythm no murmurs rubs or gallops.  No JVD.  No lower extremity edema.  Gastrointestinal system: Abdomen is soft, nontender, nondistended, positive bowel sounds.  No rebound.  No guarding.  Central nervous system: Alert and oriented. No focal neurological deficits. Extremities: Symmetric 5 x 5 power. Skin: Diffuse rash on torso and extremities with scattered small bullae with some ruptured.  No oral lesions noted.   Psychiatry: Judgement and insight appear normal. Mood & affect appropriate.                      Data Reviewed: I have personally reviewed following labs and imaging studies  CBC: Recent Labs  Lab 11/14/22 1456  WBC 16.8*  NEUTROABS 12.9*  HGB 6.8*  HCT 25.8*  MCV 62.8*  PLT 344     Basic Metabolic Panel: Recent Labs  Lab 11/14/22 1456 11/14/22 1939  NA 141  --   K 3.3*  --  CL 110  --   CO2 25  --   GLUCOSE 148*  --   BUN 25*  --   CREATININE 0.78  --   CALCIUM 8.3*  --   MG  --  2.1  PHOS  --  2.8     GFR: Estimated Creatinine Clearance: 135.5 mL/min (by C-G formula based on SCr of 0.78 mg/dL).  Liver Function Tests: Recent Labs  Lab 11/14/22 1456  AST 15  ALT 15  ALKPHOS 60  BILITOT 0.4  PROT 6.6  ALBUMIN 3.2*     CBG: No results for input(s): "GLUCAP" in the last 168 hours.   Recent Results (from the past 240 hour(s))  Blood culture (routine x 2)     Status: None (Preliminary result)   Collection Time: 11/14/22  5:30 PM   Specimen: BLOOD RIGHT FOREARM  Result Value Ref Range Status   Specimen Description   Final    BLOOD RIGHT FOREARM Performed at [facility], [place] [address],  [place], [place] [address]    Special Requests   Final    BOTTLES DRAWN AEROBIC AND ANAEROBIC Blood Culture adequate volume Performed at [place] [place] [address], [place], [place] [address]    Culture   Final    NO GROWTH 2 DAYS Performed at [place] [address], [place], [place] [address]    Report Status PENDING  Incomplete  Blood culture (routine x 2)     Status: None (Preliminary result)   Collection Time: 11/14/22  7:39 PM   Specimen: BLOOD  Result Value Ref Range Status   Specimen Description   Final    BLOOD LEFT ANTECUBITAL Performed at [facility] [address], [place], [place] [address]    Special Requests   Final    BOTTLES DRAWN AEROBIC ONLY Blood Culture adequate volume Performed at [facility] [address] [place], [place] [address]    Culture   Final    NO GROWTH 2 DAYS Performed at [facility] [address] [place], [place] [address]    Report Status PENDING  Incomplete  Urine Culture     Status: None (Preliminary result)   Collection Time: 11/14/22 11:22 PM   Specimen: Urine, Catheterized  Result Value Ref Range Status   Specimen Description   Final    URINE, CATHETERIZED Performed at [place] [address] [place], [place] [address]    Special Requests   Final    NONE Performed at [facility], [place] [address], [place], [place] [address]    Culture   Final    CULTURE REINCUBATED FOR BETTER GROWTH Performed at [facility], [place] [address] [place],  [address]    Report Status PENDING  Incomplete  Respiratory (~20 pathogens) panel by PCR     Status: None   Collection Time: 11/15/22 10:35 AM   Specimen: Nasopharyngeal Swab; Respiratory  Result Value Ref Range Status   Adenovirus NOT DETECTED NOT DETECTED Final   Coronavirus 229E NOT DETECTED NOT DETECTED Final    Comment: (NOTE) The Coronavirus on the  Respiratory Panel, DOES NOT test for the novel  Coronavirus (2019 nCoV)    Coronavirus HKU1 NOT DETECTED NOT DETECTED Final   Coronavirus NL63 NOT DETECTED NOT DETECTED Final   Coronavirus OC43 NOT DETECTED NOT DETECTED Final   Metapneumovirus NOT DETECTED NOT DETECTED Final   Rhinovirus / Enterovirus NOT  DETECTED NOT DETECTED Final   Influenza A NOT DETECTED NOT DETECTED Final   Influenza B NOT DETECTED NOT DETECTED Final   Parainfluenza Virus 1 NOT DETECTED NOT DETECTED Final   Parainfluenza Virus 2 NOT DETECTED NOT DETECTED Final   Parainfluenza Virus 3 NOT DETECTED NOT DETECTED Final   Parainfluenza Virus 4 NOT DETECTED NOT DETECTED Final   Respiratory Syncytial Virus NOT DETECTED NOT DETECTED Final   Bordetella pertussis NOT DETECTED NOT DETECTED Final   Bordetella Parapertussis NOT DETECTED NOT DETECTED Final   Chlamydophila pneumoniae NOT DETECTED NOT DETECTED Final   Mycoplasma pneumoniae NOT DETECTED NOT DETECTED Final    Comment: Performed at [facility], [place] [address], [place], [place] [address]         Radiology Studies: [place] EKG SITE RITE  Result Date: 11/15/2022 If Site Rite image not attached, placement could not be confirmed due to current cardiac rhythm.  CT ABDOMEN PELVIS W CONTRAST  Result Date: 11/15/2022 CLINICAL DATA:  Suspected bowel obstruction. Hemoccult positive stool. * Tracking Code: BO * EXAM: CT ABDOMEN AND PELVIS WITH CONTRAST TECHNIQUE: Multidetector CT imaging of the abdomen and pelvis was performed using the standard protocol following bolus administration of intravenous contrast. RADIATION DOSE REDUCTION: This exam was performed according to the departmental dose-optimization program which includes automated exposure control, adjustment of the mA and/or kV according to patient size and/or use of iterative reconstruction technique. CONTRAST:  147m OMNIPAQUE IOHEXOL 300 MG/ML  SOLN COMPARISON:  October 02, 2022. FINDINGS: Lower chest: Lung  bases are clear without effusion or consolidative changes. Hepatobiliary: Limited assessment of the liver which is incompletely imaged. No gross lesion. No biliary duct dilation following cholecystectomy. Study overall is limited by body habitus. Portal vein is patent. Pancreas: Normal, without mass, inflammation or ductal dilatation. Spleen: Top normal size. Adrenals/Urinary Tract: Adrenal glands are normal. Nephrolithiasis on the LEFT with LEFT proximal ureteral calculi in the range of 4-5 mm, perhaps 2 adjacent calculi or 1 irregular calculus displaying mild LEFT proximal ureteral and renal pelvic distension without hydronephrosis is unchanged. Calculi within the kidney also unchanged. 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 No additional ureteral calculi on today's study. No perivesical stranding. Stomach/Bowel: Large volume of stool in the rectum. No substantial perirectal stranding is. Moderate stool elsewhere in the colon. Just proximal to the hepatic flexure in the ascending colon there is irregular wall thickening with infiltration of the pericolonic fat associated with small pericolonic lymph nodes. In hindsight a subtle finding on previous imaging and persistence and appearance is highly concerning for colorectal neoplasm. Additional site of wall thickening potentially in the sigmoid colon (image 57/3) likewise potentially present and persistent. Stomach without signs of inflammation. Small bowel without signs of obstruction. Lesion in the ascending colon does not appear to obstruct. Appendix is normal. Vascular/Lymphatic: Scattered small lymph nodes throughout the retroperitoneum. No upper abdominal lymphadenopathy no gross mesenteric adenopathy though scattered small lymph nodes throughout the RIGHT colonic mesentery suspicious given other findings on the current study. No pelvic lymphadenopathy. Vascular  structures in the abdomen and pelvis are patent grossly on venous phase. Reproductive: Unremarkable. Other: No ascites.  No pneumoperitoneum. Musculoskeletal: Spinal degenerative changes. No acute or destructive bone process. Signs of body wall edema. IMPRESSION: 1. Circumferential irregular thickening of the ascending colon just proximal to the hepatic flexure with adjacent small mesenteric lymph nodes is highly concerning for if not diagnostic of  colonic neoplasm. 2. Second site of potential thickening in the sigmoid: Raise the question of an additional colorectal neoplasm of the mid sigmoid colon, less likely colitis. Endoscopic correlation is advised. 3. Signs of fecal impaction. Wall thickness of the rectum raising the question of stercoral proctocolitis. No substantial adjacent stranding at this time. 4. Proximal LEFT ureteral calculus/calculi with mild fullness of the LEFT ureter, stable appearance of intrarenal calculi on the LEFT since previous imaging. 5. Signs of body wall edema 6. Limited assessment of the liver which is incompletely imaged. These results will be called to the ordering clinician or representative by the Radiologist Assistant, and communication documented in the PACS or [employer]. Electronically Signed   By: Zetta Bills M.D.   On: 11/15/2022 08:26   DG Abd 1 View  Result Date: 11/14/2022 CLINICAL DATA:  Constipation, no bowel movement for 3 days. EXAM: ABDOMEN - 1 VIEW COMPARISON:  None Available. FINDINGS: A few distended loops of small bowel are noted in the abdomen measuring up to 3.5 cm. A moderate amount of retained stool is present in the colon and rectum. No radio-opaque calculi or other acute radiographic abnormality are seen. Surgical clips are present in the right upper quadrant. IMPRESSION: 1. Multiple distended loops of small bowel in the abdomen measuring up to 3.5 cm, possible ileus versus obstruction. 2. Moderate amount of retained stool in the colon.  Electronically Signed   By: Brett Fairy M.D.   On: 11/14/2022 20:41   DG Chest Portable 1 View  Result Date: 11/14/2022 CLINICAL DATA:  weakness EXAM: PORTABLE CHEST 1 VIEW COMPARISON:  Chest x-ray 05/20/2011 FINDINGS: The heart and mediastinal contours are within normal limits. Left costophrenic angle and left base/hemidiaphragm collimated off view. No focal consolidation. No pulmonary edema. No pleural effusion. No pneumothorax. No acute osseous abnormality. IMPRESSION: No active disease; however, left costophrenic angle and left base/hemidiaphragm collimated off view. Cannot exclude consolidation at the left base. Recommend repeat PA and lateral view of the chest. Electronically Signed   By: Iven Finn M.D.   On: 11/14/2022 17:07        Scheduled Meds:  bisacodyl  10 mg Rectal Daily   docusate sodium  100 mg Oral BID   feeding supplement  1 Container Oral TID BM   nystatin   Topical q12n4p   senna  1 tablet Oral BID   Continuous Infusions:  famotidine (PEPCID) IV 20 mg (80/99/83 3825)   folic acid 1 mg in sodium chloride 0.9 % 50 mL IVPB 1 mg (11/15/22 1115)     LOS: 2 days    Time spent: 40 minutes    Irine Seal, MD [facility]   To contact the attending provider between 7A-7P or the covering provider during after hours 7P-7A, please log into the web site [email or web address] and access using universal [place] password for that web site. If you do not have the password, please call the hospital operator.  11/16/2022, 10:49 AM

## 2022-11-16 NOTE — Progress Notes (Signed)
Peripherally Inserted Central Catheter Placement  The IV Nurse has discussed with the patient and/or persons authorized to consent for the patient, the purpose of this procedure and the potential benefits and risks involved with this procedure.  The benefits include less needle sticks, lab draws from the catheter, and the patient may be discharged home with the catheter. Risks include, but not limited to, infection, bleeding, blood clot (thrombus formation), and puncture of an artery; nerve damage and irregular heartbeat and possibility to perform a PICC exchange if needed/ordered by physician.  Alternatives to this procedure were also discussed.  Bard Power PICC patient education guide, fact sheet on infection prevention and patient information card has been provided to patient /or left at bedside.    PICC Placement Documentation  PICC Double Lumen 10/62/69 Right Basilic 51 cm 2 cm (Active)  Indication for Insertion or Continuance of Line Limited venous access - need for IV therapy >5 days (PICC only) 11/16/22 1431  Exposed Catheter (cm) 2 cm 11/16/22 1431  Site Assessment Clean, Dry, Intact 11/16/22 1431  Lumen #1 Status Flushed;Saline locked;Blood return noted 11/16/22 1431  Lumen #2 Status Flushed;Saline locked;Blood return noted 11/16/22 1431  Dressing Type Transparent;Securing device 11/16/22 1431  Dressing Status Antimicrobial disc in place 11/16/22 1431  Dressing Intervention New dressing;Other (Comment) 11/16/22 1431  Dressing Change Due 11/22/22 11/16/22 1431       Ellen Hill 11/16/2022, 2:32 PM

## 2022-11-16 NOTE — Plan of Care (Signed)
  Problem: Fluid Volume: Goal: Hemodynamic stability will improve Outcome: Progressing   

## 2022-11-16 NOTE — Progress Notes (Addendum)
[facility] Gastroenterology Progress Note  Ellen Hill 51 y.o. 05/08/1971  CC: Iron deficiency anemia, abnormal CT scan  Subjective: Patient seen and examined at bedside.  His only drink one third of the prep.  Currently having solid stools.  Denies any abdominal pain.  ROS : Negative for vomiting.  Negative for fever.   Objective: Vital signs in last 24 hours: Vitals:   11/15/22 1954 11/16/22 0454  BP: (!) 101/46 138/62  Pulse: (!) 103 (!) 102  Resp: 18 18  Temp: 98.4 F (36.9 C) 98.2 F (36.8 C)  SpO2: 100% 100%    Physical Exam:  General -acutely obese, resting in the bed, not in acute distress Abdomen -obese abdomen limiting physical examination, overall abdomen is soft, bowel sounds present and no peritoneal signs.  Lab Results: Recent Labs    11/14/22 1456 11/14/22 1939  NA 141  --   K 3.3*  --   CL 110  --   CO2 25  --   GLUCOSE 148*  --   BUN 25*  --   CREATININE 0.78  --   CALCIUM 8.3*  --   MG  --  2.1  PHOS  --  2.8   Recent Labs    11/14/22 1456  AST 15  ALT 15  ALKPHOS 60  BILITOT 0.4  PROT 6.6  ALBUMIN 3.2*   Recent Labs    11/14/22 1456  WBC 16.8*  NEUTROABS 12.9*  HGB 6.8*  HCT 25.8*  MCV 62.8*  PLT 344   Recent Labs    11/14/22 1939  LABPROT 15.5*  INR 1.2      Assessment/Plan: -Severe iron deficiency anemia with hemoglobin of 6.8.  She has received blood transfusion but looks like not able to redraw blood for repeat CBC.  Plan is to get a PICC line now. -Abnormal CT scan concerning for circumferential wall thickening of the ascending colon proximal to hepatic flexure as well as potential thickening in the sigmoid colon.  Concerning for neoplasm.  Also showed signs of fecal impaction with wall thickening of the rectum and possible stercoral proctitis.  CT scan with IV contrast in October 2023 did not show this finding. -Paraplegia -Morbid obesity  Recommendations --------------------------- -Patient only drink one  third of prep so far.  Having solid to loose stools.  I think patient may need another half gallon of prep tomorrow (assuming that she finishes her gallon prep today ) given CT scan showing severe constipation.  I do not think she will be ready for colonoscopy tomorrow.  Given her comorbidities, I think it would be better to do colonoscopy after she finishes her extra prep.  This was discussed with the patient.  She verbalized understanding.  Okay to have clear liquids diet today.  -Also having IV access issue and she may get a PICC line today.  GI will follow.  Otis Brace MD, [place] 11/16/2022, 10:19 AM  Contact #  [phone]

## 2022-11-17 ENCOUNTER — Inpatient Hospital Stay (HOSPITAL_COMMUNITY): Payer: Medicare HMO

## 2022-11-17 DIAGNOSIS — R21 Rash and other nonspecific skin eruption: Secondary | ICD-10-CM | POA: Diagnosis not present

## 2022-11-17 DIAGNOSIS — D649 Anemia, unspecified: Secondary | ICD-10-CM | POA: Diagnosis not present

## 2022-11-17 DIAGNOSIS — E876 Hypokalemia: Secondary | ICD-10-CM | POA: Diagnosis not present

## 2022-11-17 DIAGNOSIS — I10 Essential (primary) hypertension: Secondary | ICD-10-CM | POA: Diagnosis not present

## 2022-11-17 LAB — BASIC METABOLIC PANEL
Anion gap: 17 — ABNORMAL HIGH (ref 5–15)
BUN: 15 mg/dL (ref 6–20)
CO2: 18 mmol/L — ABNORMAL LOW (ref 22–32)
Calcium: 8.4 mg/dL — ABNORMAL LOW (ref 8.9–10.3)
Chloride: 110 mmol/L (ref 98–111)
Creatinine, Ser: 0.79 mg/dL (ref 0.44–1.00)
GFR, Estimated: >60 mL/min (ref >60)
Glucose, Bld: 128 mg/dL — ABNORMAL HIGH (ref 70–99)
Potassium: 3.6 mmol/L (ref 3.5–5.1)
Sodium: 145 mmol/L (ref 135–145)

## 2022-11-17 LAB — URINALYSIS, ROUTINE W REFLEX MICROSCOPIC
Bilirubin Urine: NEGATIVE
Glucose, UA: NEGATIVE mg/dL
Ketones, ur: 5 mg/dL — AB
Nitrite: NEGATIVE
Protein, ur: 30 mg/dL — AB
Specific Gravity, Urine: 1.025 (ref 1.005–1.030)
pH: 5 (ref 5.0–8.0)

## 2022-11-17 LAB — CBC WITH DIFFERENTIAL/PLATELET
Abs Immature Granulocytes: 0.17 10*3/uL — ABNORMAL HIGH (ref 0.00–0.07)
Basophils Absolute: 0.1 10*3/uL (ref 0.0–0.1)
Basophils Relative: 0 %
Eosinophils Absolute: 2 10*3/uL — ABNORMAL HIGH (ref 0.0–0.5)
Eosinophils Relative: 8 %
HCT: 38.5 % (ref 36.0–46.0)
Hemoglobin: 11.1 g/dL — ABNORMAL LOW (ref 12.0–15.0)
Immature Granulocytes: 1 %
Lymphocytes Relative: 15 %
Lymphs Abs: 3.9 10*3/uL (ref 0.7–4.0)
MCH: 19.6 pg — ABNORMAL LOW (ref 26.0–34.0)
MCHC: 28.8 g/dL — ABNORMAL LOW (ref 30.0–36.0)
MCV: 68.1 fL — ABNORMAL LOW (ref 80.0–100.0)
Monocytes Absolute: 1.5 10*3/uL — ABNORMAL HIGH (ref 0.1–1.0)
Monocytes Relative: 6 %
Neutro Abs: 18.2 10*3/uL — ABNORMAL HIGH (ref 1.7–7.7)
Neutrophils Relative %: 70 %
Platelets: 336 10*3/uL (ref 150–400)
RBC: 5.65 MIL/uL — ABNORMAL HIGH (ref 3.87–5.11)
RDW: 29.7 % — ABNORMAL HIGH (ref 11.5–15.5)
WBC: 25.8 10*3/uL — ABNORMAL HIGH (ref 4.0–10.5)
nRBC: 0 % (ref 0.0–0.2)

## 2022-11-17 LAB — MAGNESIUM: Magnesium: 2.2 mg/dL (ref 1.7–2.4)

## 2022-11-17 MED ORDER — PEG 3350-KCL-NA BICARB-NACL 420 G PO SOLR
4000.0000 mL | Freq: Once | ORAL | Status: AC
  Administered 2022-11-17: 4000 mL via ORAL

## 2022-11-17 MED ORDER — ORAL CARE MOUTH RINSE: 15.0000 mL | OROMUCOSAL | Status: DC | PRN

## 2022-11-17 NOTE — Progress Notes (Addendum)
PROGRESS NOTE    Ellen Hill [facility]  QQV:[id number] DOB: [birth year] DOA: 11/14/2022 PCP: [facility]    Chief Complaint  Patient presents with   Allergic Reaction    Brief Narrative:  Patient is a 51 year old female history of paraplegia as a result of laminectomy, history of chronic anemia, morbid obesity, hypertension, constipation presented with a rash ongoing x 2 weeks which had developed on her anterior trunk and extremities and abdomen started on doxycycline and treated with steroids however no significant improvement.  Patient noted not to have any mucosal involvement or rash of the palms of the hands and the feet no involvement of the posterior body.  Patient also noted to have a history of drug-induced constipation.  Patient noted on 10/30/2022 to be started on Bentyl however reported rash started on her legs progressed up to 2 days after starting Bentyl and using steroids only made rash worse and spread with a burning and itching sensation.  Patient noted to be followed by GI for chronic anemia and was to undergo endoscopy in the OR unfortunately developed rash and unable to get this done.  Patient presented to the ED, noted to have a significant anemia with a hemoglobin of 6.8 and admitted.  Patient transfused 2 units packed red blood cells.  Posttransfusion CBC pending.  CT abdomen and pelvis done due to concern for ileus with circumferential irregular thickening of the ascending colon proximal to the hepatic flexure, small mesenteric lymph nodes findings concerning for colonic neoplasm, second potential site of thickening in the sigmoid also raising concern for colorectal neoplasm, significant fecal impaction, left ureteral calculus/calculi with mild fullness of the left ureter stable appearance of intra renal calculi on the left since previous imaging, signs of body wall edema.  GI consulted for further evaluation.  ID also consulted for evaluation of rash.   Assessment  & Plan:   Active Problems:   Class 3 severe obesity due to excess calories without serious comorbidity with body mass index (BMI) of 60.0 to 69.9 in adult (HCC)   Anemia   Leukocytosis   Essential hypertension   Hypokalemia   Bullous rash   Constipation   Ileus (HCC)   Erythema multiforme   Symptomatic anemia  #1 rash -Patient noted to have developed a rash for the past 2 weeks which have worsened, rash with bullous changes and per ID concern for erythema multiforme with no severe sloughing or Nikolsky sign noted. -Patient with no mucosal involvement and no prior HSV infection. -HIV negative.  RPR nonreactive.  Respiratory viral PCR negative. -Mycoplasma IgM pending. -ID recommending general surgery evaluation for punch biopsy. -ID feels could be possibly related to Bentyl. -Also concern for possible paraneoplastic rash. -ID recommending calamine lotion for itching and will need outpatient follow-up with dermatology. -Hydroxyzine as needed.  2.  Severe iron deficiency anemia -Patient with no overt bleeding. -FOBT negative. -Hemoglobin noted at 6.8 on admission (11/14/2022). -Status posttransfusion 2 units packed red blood cells, hemoglobin this morning at 11.1 this morning. -Anemia panel with iron level of 12, ferritin of 3, folate of 4.6. -CT abdomen and pelvis with circumferential irregular thickening of the ascending colon proximal to the hepatic flexure with adjacent small mesenteric lymph nodes concerning for colonic neoplasm, potential second site in the sigmoid also raising concern for additional colorectal neoplasm of the mid sigmoid colon less likely colitis.  Fecal impaction also noted. -GI consulted for further evaluation and patient started on a bowel prep for colonoscopy and likely  needs 2-day prep per GI recommendations. -Continue clear liquids. -Follow H&H, transfusion threshold hemoglobin < 7.  3.  Hypokalemia -Repleted. -Potassium at 3.6. -Magnesium at  2.2.  4.  Constipation -Likely drug-induced. -Disimpaction ordered. -Patient was placed on Dulcolax suppository and Senokot as also received a smog enema.   -Currently receiving bowel prep for colonoscopy.  -GI following.  5.  Hypertension -Blood pressure stable.  -Follow.  6.  Morbid obesity -Lifestyle modification -Outpatient follow-up with PCP.  7.  Leukocytosis -With a worsening leukocytosis. -Currently afebrile. -Repeat UA with cultures and sensitivities, blood cultures pending with no growth to date.  Respiratory viral panel negative. -Chest x-ray. -Patient nontoxic-appearing. -Monitor off antibiotics at this time and repeat labs in the AM.   DVT prophylaxis: SCDs Code Status: Full Family Communication: Updated patient.  No family at bedside. Disposition: Likely home once cleared by gastroenterology.  Status is: Inpatient Remains inpatient appropriate because: Severity of illness.  Needing further evaluation for severe iron deficiency anemia.   Consultants:  Gastroenterology: Dr. Paulita Fujita 11/15/2022 ID: Dr. Gale Journey 11/15/2022  Procedures:  Transfusion 2 units packed red blood cells CT abdomen and pelvis 11/15/2022 Abdominal films 11/14/2022 Chest x-ray 11/14/2022  Antimicrobials:  Anti-infectives (From admission, onward)    Start     Dose/Rate Route Frequency Ordered Stop   11/14/22 1545  cefTRIAXone (ROCEPHIN) 1 g in sodium chloride 0.9 % 100 mL IVPB        1 g 200 mL/hr over 30 Minutes Intravenous  Once 11/14/22 1539 11/15/22 0700         Subjective: Patient laying up in bed.  States calamine lotion is helping with the itching.  Having some bowel movements.  Still drinking prep.  No chest pain.  No shortness of breath.  No abdominal pain.   Objective: Vitals:   11/16/22 1321 11/16/22 2122 11/17/22 0619 11/17/22 1352  BP: 127/73 112/74 108/80 (!) 117/51  Pulse: (!) 102 (!) 116 (!) 107 100  Resp: '17 18 16 16  '$ Temp: 98.2 F (36.8 C) 98.6 F (37 C) 98.4  F (36.9 C) 98 F (36.7 C)  TempSrc: Oral Oral Oral Oral  SpO2: 100% 98% 100% 94%  Weight:      Height:        Intake/Output Summary (Last 24 hours) at 11/17/2022 1744 Last data filed at 11/17/2022 0300 Gross per 24 hour  Intake 340 ml  Output --  Net 340 ml    Filed Weights   11/14/22 1346  Weight: (!) 163.3 kg    Examination:  General exam: NAD Respiratory system: CTAB.  No wheezes, no crackles, no rhonchi.  Fair air movement.  Speaking in full sentences.   Cardiovascular system: RRR no murmurs rubs or gallops.  No JVD.  No lower extremity edema.   Gastrointestinal system: Abdomen soft, nontender, nondistended, positive bowel sounds.  No rebound.  No guarding.  Central nervous system: Alert and oriented. No focal neurological deficits. Extremities: Symmetric 5 x 5 power. Skin: Diffuse rash on torso and extremities with scattered small bullae with some ruptured.  No oral lesions noted.   Psychiatry: Judgement and insight appear normal. Mood & affect appropriate.                      Data Reviewed: I have personally reviewed following labs and imaging studies  CBC: Recent Labs  Lab 11/14/22 1456 11/16/22 1107 11/17/22 0305  WBC 16.8* 20.8* 25.8*  NEUTROABS 12.9* 14.9* 18.2*  HGB 6.8* 11.4* 11.1*  HCT 25.8* 39.3 38.5  MCV 62.8* 67.6* 68.1*  PLT 344 358 336     Basic Metabolic Panel: Recent Labs  Lab 11/14/22 1456 11/14/22 1939 11/16/22 1107 11/17/22 0305  NA 141  --  139 145  K 3.3*  --  3.6 3.6  CL 110  --  105 110  CO2 25  --  23 18*  GLUCOSE 148*  --  88 128*  BUN 25*  --  15 15  CREATININE 0.78  --  0.76 0.79  CALCIUM 8.3*  --  8.3* 8.4*  MG  --  2.1 2.1 2.2  PHOS  --  2.8  --   --      GFR: Estimated Creatinine Clearance: 135.5 mL/min (by C-G formula based on SCr of 0.79 mg/dL).  Liver Function Tests: Recent Labs  Lab 11/14/22 1456 11/16/22 1107  AST 15 16  ALT 15 15  ALKPHOS 60 57  BILITOT 0.4 0.9  PROT 6.6 6.5   ALBUMIN 3.2* 3.2*     CBG: No results for input(s): "GLUCAP" in the last 168 hours.   Recent Results (from the past 240 hour(s))  Blood culture (routine x 2)     Status: None (Preliminary result)   Collection Time: 11/14/22  5:30 PM   Specimen: BLOOD RIGHT FOREARM  Result Value Ref Range Status   Specimen Description   Final    BLOOD RIGHT FOREARM Performed at [facility], [place] [address], [place], [place] [address]    Special Requests   Final    BOTTLES DRAWN AEROBIC AND ANAEROBIC Blood Culture adequate volume Performed at [facility] [address], [place], [place] [address]    Culture   Final    NO GROWTH 3 DAYS Performed at [facility], [place] [address] [place], [place] [address]    Report Status PENDING  Incomplete  Blood culture (routine x 2)     Status: None (Preliminary result)   Collection Time: 11/14/22  7:39 PM   Specimen: BLOOD  Result Value Ref Range Status   Specimen Description   Final    BLOOD LEFT ANTECUBITAL Performed at [facility] [address], [place], [place] [address]    Special Requests   Final    BOTTLES DRAWN AEROBIC ONLY Blood Culture adequate volume Performed at [facility] [address] [place], [place] [address]    Culture   Final    NO GROWTH 3 DAYS Performed at [facility], [place] [address] [place], [place] [address]    Report Status PENDING  Incomplete  Urine Culture     Status: Abnormal (Preliminary result)   Collection Time: 11/14/22 11:22 PM   Specimen: Urine, Catheterized  Result Value Ref Range Status   Specimen Description   Final    URINE, CATHETERIZED Performed at [facility] [address] [place], [place] [address]    Special Requests   Final    NONE Performed at [facility], [place] [address], [place], [place] [address]    Culture (A)  Final    10,000 COLONIES/mL ENTEROCOCCUS  FAECALIS SUSCEPTIBILITIES TO FOLLOW Performed at [facility], [place] [address] [place], [place] [address]    Report Status PENDING  Incomplete  Respiratory (~20 pathogens) panel by PCR     Status: None   Collection Time: 11/15/22 10:35 AM   Specimen: Nasopharyngeal Swab; Respiratory  Result Value Ref Range Status  Adenovirus NOT DETECTED NOT DETECTED Final   Coronavirus 229E NOT DETECTED NOT DETECTED Final    Comment: (NOTE) The Coronavirus on the Respiratory Panel, DOES NOT test for the novel  Coronavirus (2019 nCoV)    Coronavirus HKU1 NOT DETECTED NOT DETECTED Final   Coronavirus NL63 NOT DETECTED NOT DETECTED Final   Coronavirus OC43 NOT DETECTED NOT DETECTED Final   Metapneumovirus NOT DETECTED NOT DETECTED Final   Rhinovirus / Enterovirus NOT DETECTED NOT DETECTED Final   Influenza A NOT DETECTED NOT DETECTED Final   Influenza B NOT DETECTED NOT DETECTED Final   Parainfluenza Virus 1 NOT DETECTED NOT DETECTED Final   Parainfluenza Virus 2 NOT DETECTED NOT DETECTED Final   Parainfluenza Virus 3 NOT DETECTED NOT DETECTED Final   Parainfluenza Virus 4 NOT DETECTED NOT DETECTED Final   Respiratory Syncytial Virus NOT DETECTED NOT DETECTED Final   Bordetella pertussis NOT DETECTED NOT DETECTED Final   Bordetella Parapertussis NOT DETECTED NOT DETECTED Final   Chlamydophila pneumoniae NOT DETECTED NOT DETECTED Final   Mycoplasma pneumoniae NOT DETECTED NOT DETECTED Final    Comment: Performed at [facility], [place] [address] [place], [place] [address]         Radiology Studies: DG CHEST PORT 1 VIEW  Result Date: 11/17/2022 CLINICAL DATA:  Leukocytosis EXAM: PORTABLE CHEST 1 VIEW COMPARISON:  11/14/2022 FINDINGS: The lungs are clear without focal pneumonia, edema, pneumothorax or pleural effusion. Cardiopericardial silhouette is at upper limits of normal for size. The visualized bony structures of the thorax are unremarkable. Right PICC line tip overlies the mid  SVC level. Telemetry leads overlie the chest. IMPRESSION: No active disease. Electronically Signed   By: Misty Stanley M.D.   On: 11/17/2022 11:10        Scheduled Meds:  bisacodyl  10 mg Rectal Daily   Chlorhexidine Gluconate Cloth  6 each Topical Daily   docusate sodium  100 mg Oral BID   feeding supplement  1 Container Oral TID BM   gabapentin  600 mg Oral TID   loratadine  10 mg Oral Daily   nystatin   Topical q12n4p   senna  1 tablet Oral BID   Continuous Infusions:  famotidine (PEPCID) IV 20 mg (17/51/02 5852)   folic acid 1 mg in sodium chloride 0.9 % 50 mL IVPB 1 mg (11/17/22 1050)     LOS: 3 days    Time spent: 40 minutes    Irine Seal, MD [facility]   To contact the attending provider between 7A-7P or the covering provider during after hours 7P-7A, please log into the web site [email or web address] and access using universal [place] password for that web site. If you do not have the password, please call the hospital operator.  11/17/2022, 5:44 PM

## 2022-11-17 NOTE — Progress Notes (Signed)
[facility] Progress Note  Ellen Hill 51 y.o. 05/01/1971  CC: Iron deficiency anemia, abnormal CT scan  Subjective: Patient seen and examined at bedside.  She still has not finished her original gallon prep.  Discussed with RN.  Bowel movements not clear yet.  ROS : Negative for vomiting.  Negative for fever.   Objective: Vital signs in last 24 hours: Vitals:   11/16/22 2122 11/17/22 0619  BP: 112/74 108/80  Pulse: (!) 116 (!) 107  Resp: 18 16  Temp: 98.6 F (37 C) 98.4 F (36.9 C)  SpO2: 98% 100%    Physical Exam:  General -acutely obese, resting in the bed, not in acute distress Abdomen -obese abdomen limiting physical examination, overall abdomen is soft, bowel sounds present and no peritoneal signs.  Lab Results: Recent Labs    11/14/22 1939 11/16/22 1107 11/17/22 0305  NA  --  139 145  K  --  3.6 3.6  CL  --  105 110  CO2  --  23 18*  GLUCOSE  --  88 128*  BUN  --  15 15  CREATININE  --  0.76 0.79  CALCIUM  --  8.3* 8.4*  MG 2.1 2.1 2.2  PHOS 2.8  --   --    Recent Labs    11/14/22 1456 11/16/22 1107  AST 15 16  ALT 15 15  ALKPHOS 60 57  BILITOT 0.4 0.9  PROT 6.6 6.5  ALBUMIN 3.2* 3.2*   Recent Labs    11/16/22 1107 11/17/22 0305  WBC 20.8* 25.8*  NEUTROABS 14.9* 18.2*  HGB 11.4* 11.1*  HCT 39.3 38.5  MCV 67.6* 68.1*  PLT 358 336   Recent Labs    11/14/22 1939  LABPROT 15.5*  INR 1.2      Assessment/Plan: -Severe iron deficiency anemia with hemoglobin of 6.8.  She has received blood transfusion but looks like not able to redraw blood for repeat CBC.  Plan is to get a PICC line now. -Abnormal CT scan concerning for circumferential wall thickening of the ascending colon proximal to hepatic flexure as well as potential thickening in the sigmoid colon.  Concerning for neoplasm.  Also showed signs of fecal impaction with wall thickening of the rectum and possible stercoral proctitis.  CT scan with IV contrast in October  2023 did not show this finding. -Paraplegia -Morbid obesity  Recommendations --------------------------- -Patient still has not finished the original gallon prep.  Bowel movements are still not clear.  Recommend to drink additional half a gallon today.  Plan for colonoscopy tomorrow.  Risks (bleeding, infection, bowel perforation that could require surgery, sedation-related changes in cardiopulmonary systems), benefits (identification and possible treatment of source of symptoms, exclusion of certain causes of symptoms), and alternatives (watchful waiting, radiographic imaging studies, empiric medical treatment)  were explained to patient/family in detail and patient wishes to proceed.   Otis Brace MD, [place] 11/17/2022, 12:43 PM  Contact #  [phone]

## 2022-11-18 DIAGNOSIS — D649 Anemia, unspecified: Secondary | ICD-10-CM | POA: Diagnosis not present

## 2022-11-18 DIAGNOSIS — E876 Hypokalemia: Secondary | ICD-10-CM | POA: Diagnosis not present

## 2022-11-18 DIAGNOSIS — R21 Rash and other nonspecific skin eruption: Secondary | ICD-10-CM | POA: Diagnosis not present

## 2022-11-18 DIAGNOSIS — I10 Essential (primary) hypertension: Secondary | ICD-10-CM | POA: Diagnosis not present

## 2022-11-18 LAB — CBC WITH DIFFERENTIAL/PLATELET
Abs Immature Granulocytes: 0.09 10*3/uL — ABNORMAL HIGH (ref 0.00–0.07)
Basophils Absolute: 0 10*3/uL (ref 0.0–0.1)
Basophils Relative: 0 %
Eosinophils Absolute: 1.9 10*3/uL — ABNORMAL HIGH (ref 0.0–0.5)
Eosinophils Relative: 10 %
HCT: 35.8 % — ABNORMAL LOW (ref 36.0–46.0)
Hemoglobin: 10.3 g/dL — ABNORMAL LOW (ref 12.0–15.0)
Immature Granulocytes: 1 %
Lymphocytes Relative: 16 %
Lymphs Abs: 2.9 10*3/uL (ref 0.7–4.0)
MCH: 19.5 pg — ABNORMAL LOW (ref 26.0–34.0)
MCHC: 28.8 g/dL — ABNORMAL LOW (ref 30.0–36.0)
MCV: 67.8 fL — ABNORMAL LOW (ref 80.0–100.0)
Monocytes Absolute: 1.1 10*3/uL — ABNORMAL HIGH (ref 0.1–1.0)
Monocytes Relative: 6 %
Neutro Abs: 12.6 10*3/uL — ABNORMAL HIGH (ref 1.7–7.7)
Neutrophils Relative %: 67 %
Platelets: 203 10*3/uL (ref 150–400)
RBC: 5.28 MIL/uL — ABNORMAL HIGH (ref 3.87–5.11)
RDW: 30.2 % — ABNORMAL HIGH (ref 11.5–15.5)
WBC: 18.5 10*3/uL — ABNORMAL HIGH (ref 4.0–10.5)
nRBC: 0 % (ref 0.0–0.2)

## 2022-11-18 LAB — BASIC METABOLIC PANEL
Anion gap: 9 (ref 5–15)
BUN: 14 mg/dL (ref 6–20)
CO2: 22 mmol/L (ref 22–32)
Calcium: 8.2 mg/dL — ABNORMAL LOW (ref 8.9–10.3)
Chloride: 107 mmol/L (ref 98–111)
Creatinine, Ser: 0.62 mg/dL (ref 0.44–1.00)
GFR, Estimated: >60 mL/min (ref >60)
Glucose, Bld: 114 mg/dL — ABNORMAL HIGH (ref 70–99)
Potassium: 3.1 mmol/L — ABNORMAL LOW (ref 3.5–5.1)
Sodium: 138 mmol/L (ref 135–145)

## 2022-11-18 LAB — TYPE AND SCREEN
ABO/RH(D): A POS
Antibody Screen: NEGATIVE
Unit division: 0
Unit division: 0
Unit division: 0

## 2022-11-18 LAB — BPAM RBC
Blood Product Expiration Date: 202312172359
Blood Product Expiration Date: 202312172359
Blood Product Expiration Date: 202312182359
ISSUE DATE / TIME: 202311232331
ISSUE DATE / TIME: 202311240223
Unit Type and Rh: 6200
Unit Type and Rh: 6200
Unit Type and Rh: 6200

## 2022-11-18 LAB — URINE CULTURE
Culture: 10000 — AB
Culture: NO GROWTH

## 2022-11-18 LAB — MAGNESIUM: Magnesium: 2.2 mg/dL (ref 1.7–2.4)

## 2022-11-18 LAB — MYCOPLASMA PNEUMONIAE ANTIBODY, IGM: Mycoplasma pneumo IgM: <770 U/mL (ref 0–769)

## 2022-11-18 MED ORDER — POLYETHYLENE GLYCOL 3350 17 GM/SCOOP PO POWD
1.0000 | Freq: Once | ORAL | Status: AC
  Administered 2022-11-18: 238 g via ORAL
  Filled 2022-11-18 (×2): qty 255

## 2022-11-18 MED ORDER — POTASSIUM CHLORIDE 20 MEQ PO PACK
40.0000 meq | PACK | Freq: Once | ORAL | Status: AC
  Administered 2022-11-18: 40 meq via ORAL
  Filled 2022-11-18: qty 2

## 2022-11-18 MED ORDER — POTASSIUM CHLORIDE CRYS ER 20 MEQ PO TBCR
40.0000 meq | EXTENDED_RELEASE_TABLET | ORAL | Status: DC
  Administered 2022-11-18: 40 meq via ORAL
  Filled 2022-11-18: qty 2

## 2022-11-18 NOTE — H&P (View-Only) (Signed)
[facility] Progress Note  SUBJECTIVE:   Interval history: Ellen Hill was seen and evaluated today at bedside.  Ellen Hill was resting comfortably in bed.  She noted having some epigastric abdominal pain.  She noted tolerating some of her bowel preparation though not completely.  Per discussion with nursing bowel movements are liquid though brown in color.  Patient is agreeable to attempting different bowel preparation.  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 after loss of parents, is fine   Vaginal Pap smear, abnormal    f/u wnl   Past Surgical History:  Procedure Laterality Date   CERVICAL POLYPECTOMY  05/01/2012   Procedure: CERVICAL POLYPECTOMY;  Surgeon: Betsy Coder, MD;  Location: [facility];  Service: Gynecology;  Laterality: N/A;   CESAREAN SECTION  07/04/2007   CHOLECYSTECTOMY  07/06/2013   CHOLECYSTECTOMY N/A 07/06/2013   Procedure: LAPAROSCOPIC CHOLECYSTECTOMY ;  Surgeon: Gayland Curry, MD;  Location: [facility];  Service: General;  Laterality: N/A;  attempted cholangiogram   fibroidecto     fibroidectomy  07/04/2007   LUMBAR LAMINECTOMY/DECOMPRESSION MICRODISCECTOMY N/A 01/19/2019   Procedure: Thoracic Nine-Lumbar One Decompressive Laminectomy;  Surgeon: Earnie Larsson, MD;  Location: [facility];  Service: Neurosurgery;  Laterality: N/A;  posterior   MYOMECTOMY  07/04/08   OPERATIVE  HYSTEROSCOPY  05/01/2012   no menses since that time   TUBAL LIGATION  07/04/2007   Current Facility-Administered Medications  Medication Dose Route Frequency Provider Last Rate Last Admin   acetaminophen (TYLENOL) tablet 650 mg  650 mg Oral Q6H PRN Doutova, Anastassia, MD       Or   acetaminophen (TYLENOL) suppository 650 mg  650 mg Rectal Q6H PRN Doutova, Anastassia, MD       bisacodyl (DULCOLAX) suppository 10 mg  10 mg Rectal Daily PRN Doutova, Anastassia, MD       bisacodyl (DULCOLAX) suppository 10 mg  10 mg Rectal Daily Eugenie Filler, MD   10 mg at 11/18/22 4098   calamine lotion 1 Application  1 Application Topical PRN Vu, Johnny Bridge T, MD   1 Application at 11/91/47 1259   Chlorhexidine Gluconate Cloth 2 % PADS 6 each  6 each Topical Daily Eugenie Filler, MD   6 each at 11/17/22 1259   docusate sodium (COLACE) capsule 100 mg  100 mg Oral BID Toy Baker, MD   100 mg at 11/18/22 0938   famotidine (PEPCID) IVPB 20 mg premix  20 mg Intravenous Q12H Doutova, Anastassia, MD 100 mL/hr at 11/18/22 1031 20 mg at 11/18/22 1031   feeding supplement (BOOST / RESOURCE BREEZE) liquid 1 Container  1 Container Oral TID BM Eugenie Filler, MD   1 Container at 11/16/22 2300  folic acid 1 mg in sodium chloride 0.9 % 50 mL IVPB  1 mg Intravenous Daily Eugenie Filler, MD 100.4 mL/hr at 11/18/22 0938 1 mg at 11/18/22 6812   gabapentin (NEURONTIN) capsule 600 mg  600 mg Oral TID Eugenie Filler, MD   600 mg at 11/18/22 7517   HYDROcodone-acetaminophen (NORCO/VICODIN) 5-325 MG per tablet 1-2 tablet  1-2 tablet Oral Q4H PRN Toy Baker, MD   2 tablet at 11/17/22 1020   hydrOXYzine (ATARAX) tablet 25 mg  25 mg Oral TID PRN Eugenie Filler, MD   25 mg at 11/18/22 1031   lip balm (CARMEX) ointment   Topical PRN Eugenie Filler, MD       loratadine (CLARITIN) tablet 10 mg  10 mg Oral Daily Eugenie Filler, MD   10 mg at 11/18/22 0940   nystatin (MYCOSTATIN/NYSTOP) topical  powder   Topical q12n4p Toy Baker, MD   Given at 11/17/22 1633   ondansetron (ZOFRAN) tablet 4 mg  4 mg Oral Q6H PRN Toy Baker, MD       Or   ondansetron (ZOFRAN) injection 4 mg  4 mg Intravenous Q6H PRN Doutova, Anastassia, MD       Oral care mouth rinse  15 mL Mouth Rinse PRN Eugenie Filler, MD       oxyCODONE-acetaminophen (PERCOCET/ROXICET) 5-325 MG per tablet 1 tablet  1 tablet Oral Q8H PRN Eugenie Filler, MD   1 tablet at 11/17/22 2214   polyethylene glycol (MIRALAX / GLYCOLAX) packet 17 g  17 g Oral Daily PRN Doutova, Anastassia, MD       polyethylene glycol powder (GLYCOLAX/MIRALAX) container 255 g  1 Container Oral Once Danton Clap H, DO       potassium chloride SA (KLOR-CON M) CR tablet 40 mEq  40 mEq Oral Q4H Eugenie Filler, MD   40 mEq at 11/18/22 0017   senna (SENOKOT) tablet 8.6 mg  1 tablet Oral BID Toy Baker, MD   8.6 mg at 11/18/22 0939   sodium chloride flush (NS) 0.9 % injection 10-40 mL  10-40 mL Intracatheter PRN Eugenie Filler, MD       traZODone (DESYREL) tablet 50 mg  50 mg Oral QHS PRN Eugenie Filler, MD       Allergies as of 11/14/2022 - Review Complete 11/14/2022  Allergen Reaction Noted   Bentyl [dicyclomine] Rash 11/14/2022   Review of Systems:  Review of Systems  Respiratory:  Negative for shortness of breath.   Cardiovascular:  Negative for chest pain.  Gastrointestinal:  Positive for abdominal pain.    OBJECTIVE:   Temp:  [98 F (36.7 C)-98.4 F (36.9 C)] 98 F (36.7 C) (11/27 0507) Pulse Rate:  [100-105] 103 (11/27 0507) Resp:  [16-18] 16 (11/27 0507) BP: (115-126)/(51-80) 126/80 (11/27 0507) SpO2:  [94 %-100 %] 100 % (11/27 0507) Last BM Date : 11/17/22 Physical Exam Constitutional:      General: She is not in acute distress.    Appearance: She is obese. She is not ill-appearing, toxic-appearing or diaphoretic.  Cardiovascular:     Rate and Rhythm: Normal rate and regular rhythm.   Pulmonary:     Breath sounds: Normal breath sounds.  Abdominal:     General: Bowel sounds are normal. There is no distension.     Palpations: Abdomen is soft.     Tenderness: There is abdominal tenderness.  Skin:    General: Skin is warm and dry.  Neurological:  Mental Status: She is alert.     Labs: Recent Labs    11/16/22 1107 11/17/22 0305 11/18/22 0250  WBC 20.8* 25.8* 18.5*  HGB 11.4* 11.1* 10.3*  HCT 39.3 38.5 35.8*  PLT 358 336 203   BMET Recent Labs    11/16/22 1107 11/17/22 0305 11/18/22 0250  NA 139 145 138  K 3.6 3.6 3.1*  CL 105 110 107  CO2 23 18* 22  GLUCOSE 88 128* 114*  BUN '15 15 14  '$ CREATININE 0.76 0.79 0.62  CALCIUM 8.3* 8.4* 8.2*   LFT Recent Labs    11/16/22 1107  PROT 6.5  ALBUMIN 3.2*  AST 16  ALT 15  ALKPHOS 57  BILITOT 0.9   PT/INR No results for input(s): "LABPROT", "INR" in the last 72 hours. Diagnostic imaging: DG CHEST PORT 1 VIEW  Result Date: 11/17/2022 CLINICAL DATA:  Leukocytosis EXAM: PORTABLE CHEST 1 VIEW COMPARISON:  11/14/2022 FINDINGS: The lungs are clear without focal pneumonia, edema, pneumothorax or pleural effusion. Cardiopericardial silhouette is at upper limits of normal for size. The visualized bony structures of the thorax are unremarkable. Right PICC line tip overlies the mid SVC level. Telemetry leads overlie the chest. IMPRESSION: No active disease. Electronically Signed   By: Misty Stanley M.D.   On: 11/17/2022 11:10    IMPRESSION: Abnormal CT imaging, distal ascending colon, sigmoid colon Scattered small lymph nodes throughout right colonic mesentery Abdominal pain Microcytic anemia Leukocytosis  PLAN: -Bowel preparation incomplete today, cancel plans for colonoscopy today -Patient agreeable to Gatorade/MiraLAX bowel preparation, to be completed this afternoon -Okay for clear liquid diet today -Plan for colonoscopy 11/19/2022   LOS: 4 days   Danton Clap, [facility]  Gastroenterology

## 2022-11-18 NOTE — Progress Notes (Signed)
PROGRESS NOTE    [facility]  DDU:[id number] DOB: 1971-03-02 DOA: 11/14/2022 PCP: [facility]    Chief Complaint  Patient presents with   Allergic Reaction    Brief Narrative:  Patient is a 51 year old female history of paraplegia as a result of laminectomy, history of chronic anemia, morbid obesity, hypertension, constipation presented with a rash ongoing x 2 weeks which had developed on her anterior trunk and extremities and abdomen started on doxycycline and treated with steroids however no significant improvement.  Patient noted not to have any mucosal involvement or rash of the palms of the hands and the feet no involvement of the posterior body.  Patient also noted to have a history of drug-induced constipation.  Patient noted on 10/30/2022 to be started on Bentyl however reported rash started on her legs progressed up to 2 days after starting Bentyl and using steroids only made rash worse and spread with a burning and itching sensation.  Patient noted to be followed by GI for chronic anemia and was to undergo endoscopy in the OR unfortunately developed rash and unable to get this done.  Patient presented to the ED, noted to have a significant anemia with a hemoglobin of 6.8 and admitted.  Patient transfused 2 units packed red blood cells.  Posttransfusion CBC pending.  CT abdomen and pelvis done due to concern for ileus with circumferential irregular thickening of the ascending colon proximal to the hepatic flexure, small mesenteric lymph nodes findings concerning for colonic neoplasm, second potential site of thickening in the sigmoid also raising concern for colorectal neoplasm, significant fecal impaction, left ureteral calculus/calculi with mild fullness of the left ureter stable appearance of intra renal calculi on the left since previous imaging, signs of body wall edema.  GI consulted for further evaluation.  ID also consulted for evaluation of rash.   Assessment  & Plan:   Active Problems:   Class 3 severe obesity due to excess calories without serious comorbidity with body mass index (BMI) of 60.0 to 69.9 in adult (HCC)   Anemia   Leukocytosis   Essential hypertension   Hypokalemia   Bullous rash   Constipation   Ileus (HCC)   Erythema multiforme   Symptomatic anemia  #1 rash -Patient noted to have developed a rash for the past 2 weeks which have worsened, rash with bullous changes and per ID concern for erythema multiforme with no severe sloughing or Nikolsky sign noted. -Patient with no mucosal involvement and no prior HSV infection. -HIV negative.  RPR nonreactive.  Respiratory viral PCR negative. -Mycoplasma IgM pending. -ID recommending general surgery evaluation for punch biopsy. -ID feels could be possibly related to Bentyl. -Also concern for possible paraneoplastic rash. -ID recommending calamine lotion for itching and will need outpatient follow-up with dermatology. -Hydroxyzine as needed.  2.  Severe iron deficiency anemia -Patient with no overt bleeding. -FOBT negative. -Hemoglobin noted at 6.8 on admission (11/14/2022). -Status posttransfusion 2 units packed red blood cells, hemoglobin this morning at 11.1 this morning. -Anemia panel with iron level of 12, ferritin of 3, folate of 4.6. -CT abdomen and pelvis with circumferential irregular thickening of the ascending colon proximal to the hepatic flexure with adjacent small mesenteric lymph nodes concerning for colonic neoplasm, potential second site in the sigmoid also raising concern for additional colorectal neoplasm of the mid sigmoid colon less likely colitis.  Fecal impaction also noted. -GI consulted for further evaluation and patient started on a bowel prep for colonoscopy.  Bowel  prep incomplete today and as such possible colonoscopy canceled, patient agreeable to Gatorade/MiraLAX bowel prep and plan for colonoscopy tomorrow 11/19/2022.  -Per GI.  -Continue clear  liquids. -Follow H&H, transfusion threshold hemoglobin < 7.  3.  Hypokalemia -Repleted. -Potassium at 3.1 -Magnesium at 2.2. -Replete potassium.  4.  Constipation -Likely drug-induced. -Disimpaction ordered. -Patient was placed on Dulcolax suppository and Senokot as also received a smog enema.   -Currently receiving bowel prep for colonoscopy.  -GI following.  5.  Hypertension -Blood pressure stable.  -Follow.  6.  Morbid obesity -Lifestyle modification -Outpatient follow-up with PCP.  7.  Leukocytosis -With a worsening leukocytosis initially but trending back down.. -Currently afebrile. -Repeat UA with trace leukocytes, negative nitrites, 11-20 WBCs.  Urine cultures with 10,000 colonies of Enterococcus faecalis.  Blood cultures pending with no growth to date.  Respiratory viral panel negative. -Chest x-ray with no acute abnormalities.. -Patient nontoxic-appearing. -Monitor off antibiotics at this time and repeat labs in the AM.   DVT prophylaxis: SCDs Code Status: Full Family Communication: Updated patient.  Updated sister at bedside.   Disposition: Likely home once cleared by gastroenterology.  Status is: Inpatient Remains inpatient appropriate because: Severity of illness.  Needing further evaluation for severe iron deficiency anemia.   Consultants:  Gastroenterology: Dr. Paulita Fujita 11/15/2022 ID: Dr. Gale Journey 11/15/2022  Procedures:  Transfusion 2 units packed red blood cells CT abdomen and pelvis 11/15/2022 Abdominal films 11/14/2022 Chest x-ray 11/14/2022  Antimicrobials:  Anti-infectives (From admission, onward)    Start     Dose/Rate Route Frequency Ordered Stop   11/14/22 1545  cefTRIAXone (ROCEPHIN) 1 g in sodium chloride 0.9 % 100 mL IVPB        1 g 200 mL/hr over 30 Minutes Intravenous  Once 11/14/22 1539 11/15/22 0700         Subjective: Patient laying in bed.  Complain of feeling uncomfortable.  States she wants to sleep however every time she tries  to sleep is having bowel movement.  Some abdominal distention.  Did not complete bowel prep yet.  Sister at bedside.  States hydroxyzine helping with itching.   Objective: Vitals:   11/17/22 0619 11/17/22 1352 11/17/22 2106 11/18/22 0507  BP: 108/80 (!) 117/51 (!) 115/55 126/80  Pulse: (!) 107 100 (!) 105 (!) 103  Resp: '16 16 18 16  '$ Temp: 98.4 F (36.9 C) 98 F (36.7 C) 98.4 F (36.9 C) 98 F (36.7 C)  TempSrc: Oral Oral Oral Oral  SpO2: 100% 94% 100% 100%  Weight:      Height:        Intake/Output Summary (Last 24 hours) at 11/18/2022 1301 Last data filed at 11/18/2022 0800 Gross per 24 hour  Intake 680 ml  Output --  Net 680 ml    Filed Weights   11/14/22 1346  Weight: (!) 163.3 kg    Examination:  General exam: NAD. Respiratory system: Lungs clear auscultation bilaterally anterior lung fields.  No wheezes, no crackles, no rhonchi.  Fair air movement.  Speaking in full sentences.  Cardiovascular system: Regular rate rhythm no murmurs rubs or gallops.  No JVD.  No lower extremity edema.  Gastrointestinal system: Abdomen is obese, soft, nontender, mildly distended, positive bowel sounds.  No rebound.  No guarding.  Central nervous system: Alert and oriented. No focal neurological deficits. Extremities: Symmetric 5 x 5 power. Skin: Diffuse rash on torso and extremities with scattered small bullae with some ruptured.  No oral lesions noted.   Psychiatry: Judgement and  insight appear normal. Mood & affect appropriate.                      Data Reviewed: I have personally reviewed following labs and imaging studies  CBC: Recent Labs  Lab 11/14/22 1456 11/16/22 1107 11/17/22 0305 11/18/22 0250  WBC 16.8* 20.8* 25.8* 18.5*  NEUTROABS 12.9* 14.9* 18.2* 12.6*  HGB 6.8* 11.4* 11.1* 10.3*  HCT 25.8* 39.3 38.5 35.8*  MCV 62.8* 67.6* 68.1* 67.8*  PLT 344 358 336 203     Basic Metabolic Panel: Recent Labs  Lab 11/14/22 1456 11/14/22 1939  11/16/22 1107 11/17/22 0305 11/18/22 0250  NA 141  --  139 145 138  K 3.3*  --  3.6 3.6 3.1*  CL 110  --  105 110 107  CO2 25  --  23 18* 22  GLUCOSE 148*  --  88 128* 114*  BUN 25*  --  '15 15 14  '$ CREATININE 0.78  --  0.76 0.79 0.62  CALCIUM 8.3*  --  8.3* 8.4* 8.2*  MG  --  2.1 2.1 2.2 2.2  PHOS  --  2.8  --   --   --      GFR: Estimated Creatinine Clearance: 135.5 mL/min (by C-G formula based on SCr of 0.62 mg/dL).  Liver Function Tests: Recent Labs  Lab 11/14/22 1456 11/16/22 1107  AST 15 16  ALT 15 15  ALKPHOS 60 57  BILITOT 0.4 0.9  PROT 6.6 6.5  ALBUMIN 3.2* 3.2*     CBG: No results for input(s): "GLUCAP" in the last 168 hours.   Recent Results (from the past 240 hour(s))  Blood culture (routine x 2)     Status: None (Preliminary result)   Collection Time: 11/14/22  5:30 PM   Specimen: BLOOD RIGHT FOREARM  Result Value Ref Range Status   Specimen Description   Final    BLOOD RIGHT FOREARM Performed at [facility], [place] [address] [place], [place] [address]    Special Requests   Final    BOTTLES DRAWN AEROBIC AND ANAEROBIC Blood Culture adequate volume Performed at [facility] [address] [place], [place] [address]    Culture   Final    NO GROWTH 4 DAYS Performed at [facility], [place] [address] [place], [place] [address]    Report Status PENDING  Incomplete  Blood culture (routine x 2)     Status: None (Preliminary result)   Collection Time: 11/14/22  7:39 PM   Specimen: BLOOD  Result Value Ref Range Status   Specimen Description   Final    BLOOD LEFT ANTECUBITAL Performed at [facility] [address] [place], [place] [address]    Special Requests   Final    BOTTLES DRAWN AEROBIC ONLY Blood Culture adequate volume Performed at [facility] [address] [place], [place] [address]    Culture   Final    NO GROWTH 4 DAYS Performed at [facility], [place] [address], [place], [place] [address]    Report Status PENDING  Incomplete  Urine Culture     Status: Abnormal   Collection Time: 11/14/22 11:22 PM   Specimen: Urine, Catheterized  Result Value Ref Range Status   Specimen Description   Final    URINE, CATHETERIZED Performed at [facility] [address] [place], [place] [address]    Special Requests   Final  NONE Performed at [facility], [place] [address], [place], [place] [address]    Culture 10,000 COLONIES/mL ENTEROCOCCUS FAECALIS (A)  Final   Report Status 11/18/2022 FINAL  Final   Organism ID, Bacteria ENTEROCOCCUS FAECALIS (A)  Final      Susceptibility   Enterococcus faecalis - MIC*    AMPICILLIN <=2 SENSITIVE Sensitive     NITROFURANTOIN <=16 SENSITIVE Sensitive     VANCOMYCIN 1 SENSITIVE Sensitive     * 10,000 COLONIES/mL ENTEROCOCCUS FAECALIS  Respiratory (~20 pathogens) panel by PCR     Status: None   Collection Time: 11/15/22 10:35 AM   Specimen: Nasopharyngeal Swab; Respiratory  Result Value Ref Range Status   Adenovirus NOT DETECTED NOT DETECTED Final   Coronavirus 229E NOT DETECTED NOT DETECTED Final    Comment: (NOTE) The Coronavirus on the Respiratory Panel, DOES NOT test for the novel  Coronavirus (2019 nCoV)    Coronavirus HKU1 NOT DETECTED NOT DETECTED Final   Coronavirus NL63 NOT DETECTED NOT DETECTED Final   Coronavirus OC43 NOT DETECTED NOT DETECTED Final   Metapneumovirus NOT DETECTED NOT DETECTED Final   Rhinovirus / Enterovirus NOT DETECTED NOT DETECTED Final   Influenza A NOT DETECTED NOT DETECTED Final   Influenza B NOT DETECTED NOT DETECTED Final   Parainfluenza Virus 1 NOT DETECTED NOT DETECTED Final   Parainfluenza Virus 2 NOT DETECTED NOT DETECTED Final   Parainfluenza Virus 3 NOT DETECTED NOT DETECTED Final   Parainfluenza Virus 4 NOT DETECTED NOT DETECTED Final   Respiratory Syncytial Virus NOT DETECTED NOT DETECTED Final   Bordetella pertussis NOT  DETECTED NOT DETECTED Final   Bordetella Parapertussis NOT DETECTED NOT DETECTED Final   Chlamydophila pneumoniae NOT DETECTED NOT DETECTED Final   Mycoplasma pneumoniae NOT DETECTED NOT DETECTED Final    Comment: Performed at [facility], [place] [address], [place], [place] [address]  Urine Culture     Status: None   Collection Time: 11/17/22  1:02 PM   Specimen: Urine, Catheterized  Result Value Ref Range Status   Specimen Description   Final    URINE, CATHETERIZED Performed at [facility] [address] [place], [place] [address]    Special Requests   Final    NONE Performed at [facility], [place] [address], [place], [place] [address]    Culture   Final    NO GROWTH Performed at [facility], [place] [address], [place], [place] [address]    Report Status 11/18/2022 FINAL  Final         Radiology Studies: DG CHEST PORT 1 VIEW  Result Date: 11/17/2022 CLINICAL DATA:  Leukocytosis EXAM: PORTABLE CHEST 1 VIEW COMPARISON:  11/14/2022 FINDINGS: The lungs are clear without focal pneumonia, edema, pneumothorax or pleural effusion. Cardiopericardial silhouette is at upper limits of normal for size. The visualized bony structures of the thorax are unremarkable. Right PICC line tip overlies the mid SVC level. Telemetry leads overlie the chest. IMPRESSION: No active disease. Electronically Signed   By: Misty Stanley M.D.   On: 11/17/2022 11:10        Scheduled Meds:  bisacodyl  10 mg Rectal Daily   Chlorhexidine Gluconate Cloth  6 each Topical Daily   docusate sodium  100 mg Oral BID   feeding supplement  1 Container Oral TID BM   gabapentin  600 mg Oral TID   loratadine  10 mg Oral Daily   nystatin   Topical q12n4p  polyethylene glycol powder  1 Container Oral Once   potassium chloride  40 mEq Oral Q4H   senna  1 tablet Oral BID   Continuous Infusions:  famotidine (PEPCID) IV 20 mg (69/45/03 8882)   folic acid 1 mg in sodium  chloride 0.9 % 50 mL IVPB 1 mg (11/18/22 0938)     LOS: 4 days    Time spent: 40 minutes    Irine Seal, MD [facility]   To contact the attending provider between 7A-7P or the covering provider during after hours 7P-7A, please log into the web site [email or web address] and access using universal [place] password for that web site. If you do not have the password, please call the hospital operator.  11/18/2022, 1:01 PM

## 2022-11-18 NOTE — Anesthesia Preprocedure Evaluation (Signed)
Anesthesia Evaluation  Patient identified by MRN, date of birth, ID band Patient awake    Reviewed: Allergy & Precautions, NPO status , Patient's Chart, lab work & pertinent test results  History of Anesthesia Complications Negative for: history of anesthetic complications  Airway Mallampati: III  TM Distance: >3 FB Neck ROM: Full    Dental no notable dental hx. (+) Dental Advisory Given   Pulmonary neg shortness of breath, asthma (does not use inhalers) , neg COPD, neg recent URI, former smoker Snores but never had sleep study   Pulmonary exam normal breath sounds clear to auscultation       Cardiovascular hypertension, Pt. on medications (-) angina (-) Past MI, (-) Cardiac Stents and (-) CABG (-) dysrhythmias + Valvular Problems/Murmurs  Rhythm:Regular Rate:Normal     Neuro/Psych  Headaches, neg Seizures PSYCHIATRIC DISORDERS  Depression    Neurogenic bowel and bladder  Neuromuscular disease (lumbar radiculopathy, thoracic myelopathy)    GI/Hepatic  CT Abd/Pelvis - 1. Circumferential irregular thickening of the ascending colon just proximal to the hepatic flexure with adjacent small mesenteric lymph nodes is highly concerning for if not diagnostic of colonic neoplasm. 2. Second site of potential thickening in the sigmoid: Raise the question of an additional colorectal neoplasm of the mid sigmoid colon, less likely colitis. Endoscopic correlation is advised. 3. Signs of fecal impaction. Wall thickness of the rectum raising the question of stercoral proctocolitis. No substantial adjacent stranding at this time. 4. Proximal LEFT ureteral calculus/calculi with mild fullness of the LEFT ureter, stable appearance of intrarenal calculi on the LEFT since previous imaging. 5. Signs of body wall edema 6. Limited assessment of the liver which is incompletely imaged.    Endo/Other    Morbid obesity K 3.1   Renal/GU negative Renal ROS  Bladder dysfunction      Musculoskeletal negative musculoskeletal ROS (+)  narcotic dependent  Abdominal  (+) + obese  Peds  Hematology  (+) Blood dyscrasia, anemia   Anesthesia Other Findings 51 year old female history of paraplegia as a result of laminectomy, history of chronic anemia, morbid obesity, hypertension  -Hemoglobin noted at 6.8 on admission (11/14/2022). -Status posttransfusion 2 units packed red blood cells, hemoglobin this morning at 9.2 this morning.  K 2.8 this morning. Now s/p 40 mEq PO potassium   Reproductive/Obstetrics  s/p tubal ligation                               Anesthesia Physical Anesthesia Plan  ASA: 4  Anesthesia Plan: MAC   Post-op Pain Management: Minimal or no pain anticipated   Induction:   PONV Risk Score and Plan: 2 and Propofol infusion and Treatment may vary due to age or medical condition  Airway Management Planned: Natural Airway and Simple Face Mask  Additional Equipment: None  Intra-op Plan:   Post-operative Plan:   Informed Consent: I have reviewed the patients History and Physical, chart, labs and discussed the procedure including the risks, benefits and alternatives for the proposed anesthesia with the patient or authorized representative who has indicated his/her understanding and acceptance.     Dental advisory given  Plan Discussed with: CRNA and Anesthesiologist  Anesthesia Plan Comments: (Discussed with patient risks of MAC including, but not limited to, minor pain or discomfort, hearing people in the room, and possible need for backup general anesthesia. Risks for general anesthesia also discussed including, but not limited to, sore throat, hoarse voice, chipped/damaged teeth, injury  to vocal cords, nausea and vomiting, allergic reactions, lung infection, heart attack, stroke, and death. All questions answered. )        Anesthesia Quick Evaluation

## 2022-11-18 NOTE — Plan of Care (Signed)

## 2022-11-18 NOTE — Progress Notes (Signed)
[facility] Progress Note  SUBJECTIVE:   Interval history: Ellen Hill was seen and evaluated today at bedside.  Ellen Hill was resting comfortably in bed.  She noted having some epigastric abdominal pain.  She noted tolerating some of her bowel preparation though not completely.  Per discussion with nursing bowel movements are liquid though brown in color.  Patient is agreeable to attempting different bowel preparation.  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 after loss of parents, is fine   Vaginal Pap smear, abnormal    f/u wnl   Past Surgical History:  Procedure Laterality Date   CERVICAL POLYPECTOMY  05/01/2012   Procedure: CERVICAL POLYPECTOMY;  Surgeon: Betsy Coder, MD;  Location: [facility];  Service: Gynecology;  Laterality: N/A;   CESAREAN SECTION  07/04/2007   CHOLECYSTECTOMY  07/06/2013   CHOLECYSTECTOMY N/A 07/06/2013   Procedure: LAPAROSCOPIC CHOLECYSTECTOMY ;  Surgeon: Gayland Curry, MD;  Location: [place];  Service: General;  Laterality: N/A;  attempted cholangiogram   fibroidecto     fibroidectomy  07/04/2007   LUMBAR LAMINECTOMY/DECOMPRESSION MICRODISCECTOMY N/A 01/19/2019   Procedure: Thoracic Nine-Lumbar One Decompressive Laminectomy;  Surgeon: Earnie Larsson, MD;  Location: [place];  Service: Neurosurgery;  Laterality: N/A;  posterior   MYOMECTOMY  07/04/08   OPERATIVE  HYSTEROSCOPY  05/01/2012   no menses since that time   TUBAL LIGATION  07/04/2007   Current Facility-Administered Medications  Medication Dose Route Frequency Provider Last Rate Last Admin   acetaminophen (TYLENOL) tablet 650 mg  650 mg Oral Q6H PRN Doutova, Anastassia, MD       Or   acetaminophen (TYLENOL) suppository 650 mg  650 mg Rectal Q6H PRN Doutova, Anastassia, MD       bisacodyl (DULCOLAX) suppository 10 mg  10 mg Rectal Daily PRN Doutova, Anastassia, MD       bisacodyl (DULCOLAX) suppository 10 mg  10 mg Rectal Daily Eugenie Filler, MD   10 mg at 11/18/22 0160   calamine lotion 1 Application  1 Application Topical PRN Vu, Johnny Bridge T, MD   1 Application at 10/93/23 1259   Chlorhexidine Gluconate Cloth 2 % PADS 6 each  6 each Topical Daily Eugenie Filler, MD   6 each at 11/17/22 1259   docusate sodium (COLACE) capsule 100 mg  100 mg Oral BID Toy Baker, MD   100 mg at 11/18/22 0938   famotidine (PEPCID) IVPB 20 mg premix  20 mg Intravenous Q12H Doutova, Anastassia, MD 100 mL/hr at 11/18/22 1031 20 mg at 11/18/22 1031   feeding supplement (BOOST / RESOURCE BREEZE) liquid 1 Container  1 Container Oral TID BM Eugenie Filler, MD   1 Container at 11/16/22 2300  folic acid 1 mg in sodium chloride 0.9 % 50 mL IVPB  1 mg Intravenous Daily Eugenie Filler, MD 100.4 mL/hr at 11/18/22 0938 1 mg at 11/18/22 6314   gabapentin (NEURONTIN) capsule 600 mg  600 mg Oral TID Eugenie Filler, MD   600 mg at 11/18/22 9702   HYDROcodone-acetaminophen (NORCO/VICODIN) 5-325 MG per tablet 1-2 tablet  1-2 tablet Oral Q4H PRN Toy Baker, MD   2 tablet at 11/17/22 1020   hydrOXYzine (ATARAX) tablet 25 mg  25 mg Oral TID PRN Eugenie Filler, MD   25 mg at 11/18/22 1031   lip balm (CARMEX) ointment   Topical PRN Eugenie Filler, MD       loratadine (CLARITIN) tablet 10 mg  10 mg Oral Daily Eugenie Filler, MD   10 mg at 11/18/22 0940   nystatin (MYCOSTATIN/NYSTOP) topical  powder   Topical q12n4p Toy Baker, MD   Given at 11/17/22 1633   ondansetron (ZOFRAN) tablet 4 mg  4 mg Oral Q6H PRN Toy Baker, MD       Or   ondansetron (ZOFRAN) injection 4 mg  4 mg Intravenous Q6H PRN Doutova, Anastassia, MD       Oral care mouth rinse  15 mL Mouth Rinse PRN Eugenie Filler, MD       oxyCODONE-acetaminophen (PERCOCET/ROXICET) 5-325 MG per tablet 1 tablet  1 tablet Oral Q8H PRN Eugenie Filler, MD   1 tablet at 11/17/22 2214   polyethylene glycol (MIRALAX / GLYCOLAX) packet 17 g  17 g Oral Daily PRN Doutova, Anastassia, MD       polyethylene glycol powder (GLYCOLAX/MIRALAX) container 255 g  1 Container Oral Once Danton Clap H, DO       potassium chloride SA (KLOR-CON M) CR tablet 40 mEq  40 mEq Oral Q4H Eugenie Filler, MD   40 mEq at 11/18/22 6378   senna (SENOKOT) tablet 8.6 mg  1 tablet Oral BID Toy Baker, MD   8.6 mg at 11/18/22 0939   sodium chloride flush (NS) 0.9 % injection 10-40 mL  10-40 mL Intracatheter PRN Eugenie Filler, MD       traZODone (DESYREL) tablet 50 mg  50 mg Oral QHS PRN Eugenie Filler, MD       Allergies as of 11/14/2022 - Review Complete 11/14/2022  Allergen Reaction Noted   Bentyl [dicyclomine] Rash 11/14/2022   Review of Systems:  Review of Systems  Respiratory:  Negative for shortness of breath.   Cardiovascular:  Negative for chest pain.  Gastrointestinal:  Positive for abdominal pain.    OBJECTIVE:   Temp:  [98 F (36.7 C)-98.4 F (36.9 C)] 98 F (36.7 C) (11/27 0507) Pulse Rate:  [100-105] 103 (11/27 0507) Resp:  [16-18] 16 (11/27 0507) BP: (115-126)/(51-80) 126/80 (11/27 0507) SpO2:  [94 %-100 %] 100 % (11/27 0507) Last BM Date : 11/17/22 Physical Exam Constitutional:      General: She is not in acute distress.    Appearance: She is obese. She is not ill-appearing, toxic-appearing or diaphoretic.  Cardiovascular:     Rate and Rhythm: Normal rate and regular rhythm.   Pulmonary:     Breath sounds: Normal breath sounds.  Abdominal:     General: Bowel sounds are normal. There is no distension.     Palpations: Abdomen is soft.     Tenderness: There is abdominal tenderness.  Skin:    General: Skin is warm and dry.  Neurological:  Mental Status: She is alert.     Labs: Recent Labs    11/16/22 1107 11/17/22 0305 11/18/22 0250  WBC 20.8* 25.8* 18.5*  HGB 11.4* 11.1* 10.3*  HCT 39.3 38.5 35.8*  PLT 358 336 203   BMET Recent Labs    11/16/22 1107 11/17/22 0305 11/18/22 0250  NA 139 145 138  K 3.6 3.6 3.1*  CL 105 110 107  CO2 23 18* 22  GLUCOSE 88 128* 114*  BUN '15 15 14  '$ CREATININE 0.76 0.79 0.62  CALCIUM 8.3* 8.4* 8.2*   LFT Recent Labs    11/16/22 1107  PROT 6.5  ALBUMIN 3.2*  AST 16  ALT 15  ALKPHOS 57  BILITOT 0.9   PT/INR No results for input(s): "LABPROT", "INR" in the last 72 hours. Diagnostic imaging: DG CHEST PORT 1 VIEW  Result Date: 11/17/2022 CLINICAL DATA:  Leukocytosis EXAM: PORTABLE CHEST 1 VIEW COMPARISON:  11/14/2022 FINDINGS: The lungs are clear without focal pneumonia, edema, pneumothorax or pleural effusion. Cardiopericardial silhouette is at upper limits of normal for size. The visualized bony structures of the thorax are unremarkable. Right PICC line tip overlies the mid SVC level. Telemetry leads overlie the chest. IMPRESSION: No active disease. Electronically Signed   By: Misty Stanley M.D.   On: 11/17/2022 11:10    IMPRESSION: Abnormal CT imaging, distal ascending colon, sigmoid colon Scattered small lymph nodes throughout right colonic mesentery Abdominal pain Microcytic anemia Leukocytosis  PLAN: -Bowel preparation incomplete today, cancel plans for colonoscopy today -Patient agreeable to Gatorade/MiraLAX bowel preparation, to be completed this afternoon -Okay for clear liquid diet today -Plan for colonoscopy 11/19/2022   LOS: 4 days   Danton Clap, [facility]  Gastroenterology

## 2022-11-19 ENCOUNTER — Encounter (HOSPITAL_COMMUNITY): Payer: Self-pay | Admitting: Internal Medicine

## 2022-11-19 ENCOUNTER — Encounter (HOSPITAL_COMMUNITY)
Admission: EM | Disposition: A | Discharge status: Home Health | Payer: Self-pay | Source: Home / Self Care | Attending: Internal Medicine

## 2022-11-19 ENCOUNTER — Inpatient Hospital Stay (HOSPITAL_COMMUNITY): Payer: Medicare HMO | Admitting: Anesthesiology

## 2022-11-19 DIAGNOSIS — D490 Neoplasm of unspecified behavior of digestive system: Secondary | ICD-10-CM | POA: Diagnosis not present

## 2022-11-19 DIAGNOSIS — Z87891 Personal history of nicotine dependence: Secondary | ICD-10-CM

## 2022-11-19 DIAGNOSIS — E876 Hypokalemia: Secondary | ICD-10-CM | POA: Diagnosis not present

## 2022-11-19 DIAGNOSIS — I10 Essential (primary) hypertension: Secondary | ICD-10-CM

## 2022-11-19 DIAGNOSIS — R21 Rash and other nonspecific skin eruption: Secondary | ICD-10-CM | POA: Diagnosis not present

## 2022-11-19 DIAGNOSIS — D649 Anemia, unspecified: Secondary | ICD-10-CM | POA: Diagnosis not present

## 2022-11-19 DIAGNOSIS — J45909 Unspecified asthma, uncomplicated: Secondary | ICD-10-CM | POA: Diagnosis not present

## 2022-11-19 HISTORY — PX: SUBMUCOSAL TATTOO INJECTION: SHX6856

## 2022-11-19 HISTORY — PX: BIOPSY: SHX5522

## 2022-11-19 HISTORY — PX: COLONOSCOPY WITH PROPOFOL: SHX5780

## 2022-11-19 LAB — BASIC METABOLIC PANEL
Anion gap: 7 (ref 5–15)
BUN: 11 mg/dL (ref 6–20)
CO2: 23 mmol/L (ref 22–32)
Calcium: 7.8 mg/dL — ABNORMAL LOW (ref 8.9–10.3)
Chloride: 104 mmol/L (ref 98–111)
Creatinine, Ser: 0.73 mg/dL (ref 0.44–1.00)
GFR, Estimated: >60 mL/min (ref >60)
Glucose, Bld: 110 mg/dL — ABNORMAL HIGH (ref 70–99)
Potassium: 2.8 mmol/L — ABNORMAL LOW (ref 3.5–5.1)
Sodium: 134 mmol/L — ABNORMAL LOW (ref 135–145)

## 2022-11-19 LAB — CBC WITH DIFFERENTIAL/PLATELET
Abs Immature Granulocytes: 0.1 10*3/uL — ABNORMAL HIGH (ref 0.00–0.07)
Basophils Absolute: 0 10*3/uL (ref 0.0–0.1)
Basophils Relative: 0 %
Eosinophils Absolute: 2.2 10*3/uL — ABNORMAL HIGH (ref 0.0–0.5)
Eosinophils Relative: 12 %
HCT: 31.5 % — ABNORMAL LOW (ref 36.0–46.0)
Hemoglobin: 9.2 g/dL — ABNORMAL LOW (ref 12.0–15.0)
Immature Granulocytes: 1 %
Lymphocytes Relative: 15 %
Lymphs Abs: 2.6 10*3/uL (ref 0.7–4.0)
MCH: 19.9 pg — ABNORMAL LOW (ref 26.0–34.0)
MCHC: 29.2 g/dL — ABNORMAL LOW (ref 30.0–36.0)
MCV: 68.2 fL — ABNORMAL LOW (ref 80.0–100.0)
Monocytes Absolute: 1.1 10*3/uL — ABNORMAL HIGH (ref 0.1–1.0)
Monocytes Relative: 6 %
Neutro Abs: 12 10*3/uL — ABNORMAL HIGH (ref 1.7–7.7)
Neutrophils Relative %: 66 %
Platelets: 158 10*3/uL (ref 150–400)
RBC: 4.62 MIL/uL (ref 3.87–5.11)
RDW: 29.9 % — ABNORMAL HIGH (ref 11.5–15.5)
WBC: 18 10*3/uL — ABNORMAL HIGH (ref 4.0–10.5)
nRBC: 0 % (ref 0.0–0.2)

## 2022-11-19 LAB — CULTURE, BLOOD (ROUTINE X 2)
Culture: NO GROWTH
Culture: NO GROWTH
Special Requests: ADEQUATE
Special Requests: ADEQUATE

## 2022-11-19 LAB — MAGNESIUM: Magnesium: 2.1 mg/dL (ref 1.7–2.4)

## 2022-11-19 SURGERY — COLONOSCOPY WITH PROPOFOL
Anesthesia: Monitor Anesthesia Care

## 2022-11-19 MED ORDER — LACTATED RINGERS IV SOLN: INTRAVENOUS | Status: DC | PRN

## 2022-11-19 MED ORDER — PROPOFOL 10 MG/ML IV BOLUS: INTRAVENOUS | Status: DC | PRN

## 2022-11-19 MED ORDER — LIDOCAINE-EPINEPHRINE (PF) 2 %-1:200000 IJ SOLN
10.0000 mL | Freq: Once | INTRAMUSCULAR | Status: DC
  Filled 2022-11-19: qty 10

## 2022-11-19 MED ORDER — PROPOFOL 500 MG/50ML IV EMUL
INTRAVENOUS | Status: DC | PRN
  Administered 2022-11-19: 100 ug/kg/min via INTRAVENOUS
  Administered 2022-11-19: 125 ug/kg/min via INTRAVENOUS

## 2022-11-19 MED ORDER — PHENYLEPHRINE 80 MCG/ML (10ML) SYRINGE FOR IV PUSH (FOR BLOOD PRESSURE SUPPORT)
PREFILLED_SYRINGE | INTRAVENOUS | Status: DC | PRN
  Administered 2022-11-19: 160 ug via INTRAVENOUS
  Administered 2022-11-19: 240 ug via INTRAVENOUS

## 2022-11-19 MED ORDER — SPOT INK MARKER SYRINGE KIT
PACK | SUBMUCOSAL | Status: AC
  Filled 2022-11-19: qty 10

## 2022-11-19 MED ORDER — POTASSIUM CHLORIDE CRYS ER 20 MEQ PO TBCR
40.0000 meq | EXTENDED_RELEASE_TABLET | ORAL | Status: AC
  Administered 2022-11-19 (×2): 40 meq via ORAL
  Filled 2022-11-19 (×2): qty 2

## 2022-11-19 MED ORDER — PROPOFOL 500 MG/50ML IV EMUL
INTRAVENOUS | Status: AC
  Filled 2022-11-19: qty 50

## 2022-11-19 MED ORDER — POTASSIUM CHLORIDE 10 MEQ/100ML IV SOLN
10.0000 meq | INTRAVENOUS | Status: AC
  Administered 2022-11-19 (×2): 10 meq via INTRAVENOUS
  Filled 2022-11-19 (×2): qty 100

## 2022-11-19 MED ORDER — LIDOCAINE 2% (20 MG/ML) 5 ML SYRINGE
INTRAMUSCULAR | Status: DC | PRN
  Administered 2022-11-19: 60 mg via INTRAVENOUS

## 2022-11-19 MED ORDER — SODIUM CHLORIDE 0.9 % IV SOLN
510.0000 mg | Freq: Once | INTRAVENOUS | Status: AC
  Administered 2022-11-19: 510 mg via INTRAVENOUS
  Filled 2022-11-19: qty 510

## 2022-11-19 MED ORDER — PROPOFOL 1000 MG/100ML IV EMUL
INTRAVENOUS | Status: AC
  Filled 2022-11-19: qty 100

## 2022-11-19 MED ORDER — SPOT INK MARKER SYRINGE KIT
PACK | SUBMUCOSAL | Status: DC | PRN
  Administered 2022-11-19: 5 mL via SUBMUCOSAL

## 2022-11-19 SURGICAL SUPPLY — 22 items

## 2022-11-19 NOTE — Anesthesia Postprocedure Evaluation (Signed)
Anesthesia Post Note  Patient: Ellen Hill  Procedure(s) Performed: COLONOSCOPY WITH PROPOFOL BIOPSY SUBMUCOSAL TATTOO INJECTION     Patient location during evaluation: PACU Anesthesia Type: MAC Level of consciousness: awake Pain management: pain level controlled Vital Signs Assessment: post-procedure vital signs reviewed and stable Respiratory status: spontaneous breathing, nonlabored ventilation and respiratory function stable Cardiovascular status: stable and blood pressure returned to baseline Postop Assessment: no apparent nausea or vomiting Anesthetic complications: no   No notable events documented.  Last Vitals:  Vitals:   11/19/22 1342 11/19/22 1416  BP: 119/65 139/75  Pulse: 92 96  Resp: 17 16  Temp:  36.7 C  SpO2: 97% 100%    Last Pain:  Vitals:   11/19/22 1416  TempSrc: Axillary  PainSc:                  Nilda Simmer

## 2022-11-19 NOTE — Interval H&P Note (Signed)
History and Physical Interval Note:  11/19/2022 11:36 AM  Ellen Hill  has presented today for surgery, with the diagnosis of Abnormal CT scan.  The various methods of treatment have been discussed with the patient and family. After consideration of risks, benefits and other options for treatment, the patient has consented to  Procedure(s): COLONOSCOPY WITH PROPOFOL (N/A) as a surgical intervention.  The patient's history has been reviewed, patient examined, no change in status, stable for surgery.  I have reviewed the patient's chart and labs.  Questions were answered to the patient's satisfaction.     Loney Laurence

## 2022-11-19 NOTE — Consult Note (Signed)
[facility] Surgery Consult Note  Tylar Merendino [facility] [birth year]  [phone].    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 Rash developed 2 weeks before presentation and is described as itchy. States rash started on her legs and then spread to her arms and trunk, sparing the back of her body. Says rash was originally just red and slightly raised but then started to blister and become painful. Denies similar rash in the past. Bentyl was started before the rash developed, denies any other new medications. She denies ulcers of her mouth. She was admitted for further management and ID was consulted, they recommend consideration of punch biopsy of the rash and surgery is consulted toady for biopsy.   She was undergoing an outpatient evaluation for constipation and anemia which has been continued inpatient and she underwent colonoscopy today. Colonoscopy significant for a recto-sigmoid mass as well as a hepatic flexure mass that were non-obstructing but concerning for malignancy. Biopsies are pending. Surgery asked to evaluate.  Patient denies abdominal pain. 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 At baseline she reports one BM every 5-6 days that is non-bloody, and takes senna/miralax for this. She does report decreased appetite over the last couple of months but denies known weight loss. Denies blood in her stools, denies melena. Denies a personal or family history of colon cancer. Until today she reports she had never had a colonoscopy.  Past abdominal surgical history: cesarean section with tubal ligation, cholecystectomy.  Substance: denies alcohol, tobacco, or drug use  Blood thinners: none  Other social history: tells me she lives at home and her  sister and daughter help care for her. She tells me she is usually awake and out of bed during the day in a chair or wheelchair. And has a CMA help her in the evenings.  ROS: Review of Systems  All other systems reviewed and are negative.  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 after loss of parents, is fine   Vaginal Pap smear, abnormal    f/u wnl    Past Surgical History:  Procedure  Laterality Date   CERVICAL POLYPECTOMY  05/01/2012   Procedure: CERVICAL POLYPECTOMY;  Surgeon: Betsy Coder, MD;  Location: [facility];  Service: Gynecology;  Laterality: N/A;   CESAREAN SECTION  07/04/2007   CHOLECYSTECTOMY  07/06/2013   CHOLECYSTECTOMY N/A 07/06/2013   Procedure: LAPAROSCOPIC CHOLECYSTECTOMY ;  Surgeon: Gayland Curry, MD;  Location: [place];  Service: General;  Laterality: N/A;  attempted cholangiogram   fibroidecto     fibroidectomy  07/04/2007   LUMBAR LAMINECTOMY/DECOMPRESSION MICRODISCECTOMY N/A 01/19/2019   Procedure: Thoracic Nine-Lumbar One Decompressive Laminectomy;  Surgeon: Earnie Larsson, MD;  Location: [facility];  Service: Neurosurgery;   Laterality: N/A;  posterior   MYOMECTOMY  07/04/08   OPERATIVE HYSTEROSCOPY  05/01/2012   no menses since that time   TUBAL LIGATION  07/04/2007    Social History:  reports that she quit smoking about 23 years ago. Her smoking use included cigarettes. She has a 0.30 pack-year smoking history. She has never used smokeless tobacco. She reports that she does not drink alcohol and does not use drugs.  Allergies:  Allergies  Allergen Reactions   Bentyl [Dicyclomine] Rash    Medications Prior to Admission  Medication Sig Dispense Refill   baclofen (LIORESAL) 10 MG tablet TAKE 1 TABLET TWICE A DAY AT 8 AM AND 1 PM WITH 5 MG TABLET. (Patient taking differently: Take 10 mg by mouth 2 (two) times daily.) 180 tablet 1   baclofen (LIORESAL) 20 MG tablet TAKE 1 TABLET EVERY DAY AT 6 PM (Patient taking differently: Take 20 mg by mouth at bedtime.) 30 tablet 4   cetirizine (ZYRTEC) 10 MG tablet Take 10 mg by mouth daily.     diphenhydrAMINE (BENADRYL) 25 mg capsule Take 25 mg by mouth every 6 (six) hours as needed for itching or allergies.     diphenhydrAMINE-zinc acetate (BENADRYL) cream Apply 1 Application topically 3 (three) times daily as needed for itching.     gabapentin (NEURONTIN) 600 MG tablet Take 600 mg by mouth 3 (three) times daily.     hydrochlorothiazide (HYDRODIURIL) 25 MG tablet TAKE 1 TABLET BY MOUTH EVERY DAY (Patient taking differently: Take 25 mg by mouth daily.) 90 tablet 3   hydrocortisone cream 1 % Apply 1 Application topically 2 (two) times daily as needed for itching.     hydrOXYzine (ATARAX) 25 MG tablet Take 25 mg by mouth 2 (two) times daily as needed for itching.     nystatin (MYCOSTATIN/NYSTOP) powder APPLY TOPICALLY 2 TIMES DAILY AT 12 NOON AND 4 PM. (Patient taking differently: Apply 1 Application topically 2 times daily at 12 noon and 4 pm.) 30 g 2   oxyCODONE-acetaminophen (PERCOCET/ROXICET) 5-325 MG tablet Take 1 tablet by mouth every 8 (eight) hours as needed for severe  pain.     Potassium Chloride ER 20 MEQ TBCR Take 20 mEq by mouth daily.     tamsulosin (FLOMAX) 0.4 MG CAPS capsule TAKE 1 CAPSULE (0.4 MG TOTAL) BY MOUTH DAILY AFTER SUPPER. 90 capsule 1   traZODone (DESYREL) 50 MG tablet Take 50 mg by mouth at bedtime as needed for sleep.     omeprazole (PRILOSEC) 20 MG capsule Take 1 capsule (20 mg total) by mouth daily. (Patient not taking: Reported on 11/15/2022) 30 capsule 0    Blood pressure 125/71, pulse 94, temperature (!) 97 F (36.1 C), temperature source Temporal, resp. rate 14, height '5\' 6"'$  (1.676 m), weight (!) 163.3 kg, SpO2 99 %. Physical Exam: Constitutional: female. Laying in bed, NAD;  conversant; no deformities Eyes: Moist conjunctiva; no lid lag; anicteric; PERRL Neck: Trachea midline; no thyromegaly Lungs: Normal respiratory effort; no tactile fremitus CV: RRR; no palpable thrills; no pitting edema GI: Abd soft, obese, non-tender; no palpable hepatosplenomegaly, no palpable hernia MSK: extremities are symmetrical no clubbing/cyanosis; rash over arms, legs, and abdominal wall. Lower extremities look more like EM - round, red, raised rash, some with a ring around them. Upper extremities with small bullae - some in tact, some ruptured.  Psychiatric: Appropriate affect; alert and oriented x3 Lymphatic: No palpable cervical or axillary lymphadenopathy  Results for orders placed or performed during the hospital encounter of 11/14/22 (from the past 48 hour(s))  Urinalysis, Routine w reflex microscopic Urine, Catheterized     Status: Abnormal   Collection Time: 11/17/22  1:01 PM  Result Value Ref Range   Color, Urine YELLOW YELLOW   APPearance CLEAR CLEAR   Specific Gravity, Urine 1.025 1.005 - 1.030   pH 5.0 5.0 - 8.0   Glucose, UA NEGATIVE NEGATIVE mg/dL   Hgb urine dipstick SMALL (A) NEGATIVE   Bilirubin Urine NEGATIVE NEGATIVE   Ketones, ur 5 (A) NEGATIVE mg/dL   Protein, ur 30 (A) NEGATIVE mg/dL   Nitrite NEGATIVE NEGATIVE    Leukocytes,Ua TRACE (A) NEGATIVE   RBC / HPF 21-50 0 - 5 RBC/hpf   WBC, UA 11-20 0 - 5 WBC/hpf   Bacteria, UA RARE (A) NONE SEEN   Squamous Epithelial / LPF 0-5 0 - 5   Mucus PRESENT    Hyaline Casts, UA PRESENT     Comment: Performed at [facility], [place] [address], [place], [place] [address]  Urine Culture     Status: None   Collection Time: 11/17/22  1:02 PM   Specimen: Urine, Catheterized  Result Value Ref Range   Specimen Description      URINE, CATHETERIZED Performed at [place] [address] [place], [place] [address]    Special Requests      NONE Performed at [facility], [place] [address], [place], [place] [address]    Culture      NO GROWTH Performed at [facility], [place] [address] [place], [place] [address]    Report Status 11/18/2022 FINAL   CBC with Differential/Platelet     Status: Abnormal   Collection Time: 11/18/22  2:50 AM  Result Value Ref Range   WBC 18.5 (H) 4.0 - 10.5 K/uL   RBC 5.28 (H) 3.87 - 5.11 MIL/uL   Hemoglobin 10.3 (L) 12.0 - 15.0 g/dL   HCT 35.8 (L) 36.0 - 46.0 %   MCV 67.8 (L) 80.0 - 100.0 fL   MCH 19.5 (L) 26.0 - 34.0 pg   MCHC 28.8 (L) 30.0 - 36.0 g/dL   RDW 30.2 (H) 11.5 - 15.5 %   Platelets 203 150 - 400 K/uL   nRBC 0.0 0.0 - 0.2 %   Neutrophils Relative % 67 %   Neutro Abs 12.6 (H) 1.7 - 7.7 K/uL   Lymphocytes Relative 16 %   Lymphs Abs 2.9 0.7 - 4.0 K/uL   Monocytes Relative 6 %   Monocytes Absolute 1.1 (H) 0.1 - 1.0 K/uL   Eosinophils Relative 10 %   Eosinophils Absolute 1.9 (H) 0.0 - 0.5 K/uL   Basophils Relative 0 %   Basophils Absolute 0.0 0.0 - 0.1 K/uL   Immature Granulocytes 1 %   Abs Immature Granulocytes 0.09 (H) 0.00 - 0.07 K/uL  Comment: Performed at [facility], [place] [address], [place], [place] [address]  Basic metabolic panel     Status: Abnormal   Collection Time: 11/18/22  2:50 AM  Result Value Ref Range   Sodium 138 135  - 145 mmol/L   Potassium 3.1 (L) 3.5 - 5.1 mmol/L   Chloride 107 98 - 111 mmol/L   CO2 22 22 - 32 mmol/L   Glucose, Bld 114 (H) 70 - 99 mg/dL    Comment: Glucose reference range applies only to samples taken after fasting for at least 8 hours.   BUN 14 6 - 20 mg/dL   Creatinine, Ser 0.62 0.44 - 1.00 mg/dL   Calcium 8.2 (L) 8.9 - 10.3 mg/dL   GFR, Estimated >60 >60 mL/min    Comment: (NOTE) Calculated using the CKD-EPI Creatinine Equation (2021)    Anion gap 9 5 - 15    Comment: Performed at [facility], [place] [address] [place], [place] [address]  Magnesium     Status: None   Collection Time: 11/18/22  2:50 AM  Result Value Ref Range   Magnesium 2.2 1.7 - 2.4 mg/dL    Comment: Performed at [facility], [place] [address], [place], [place] [address]  CBC with Differential/Platelet     Status: Abnormal   Collection Time: 11/19/22  3:43 AM  Result Value Ref Range   WBC 18.0 (H) 4.0 - 10.5 K/uL   RBC 4.62 3.87 - 5.11 MIL/uL   Hemoglobin 9.2 (L) 12.0 - 15.0 g/dL   HCT 31.5 (L) 36.0 - 46.0 %   MCV 68.2 (L) 80.0 - 100.0 fL   MCH 19.9 (L) 26.0 - 34.0 pg   MCHC 29.2 (L) 30.0 - 36.0 g/dL   RDW 29.9 (H) 11.5 - 15.5 %   Platelets 158 150 - 400 K/uL   nRBC 0.0 0.0 - 0.2 %   Neutrophils Relative % 66 %   Neutro Abs 12.0 (H) 1.7 - 7.7 K/uL   Lymphocytes Relative 15 %   Lymphs Abs 2.6 0.7 - 4.0 K/uL   Monocytes Relative 6 %   Monocytes Absolute 1.1 (H) 0.1 - 1.0 K/uL   Eosinophils Relative 12 %   Eosinophils Absolute 2.2 (H) 0.0 - 0.5 K/uL   Basophils Relative 0 %   Basophils Absolute 0.0 0.0 - 0.1 K/uL   Immature Granulocytes 1 %   Abs Immature Granulocytes 0.10 (H) 0.00 - 0.07 K/uL   Polychromasia PRESENT    Target Cells PRESENT    Ovalocytes PRESENT     Comment: Performed at [facility], [place] [address] [place], [place] [address]  Basic metabolic panel     Status: Abnormal   Collection Time: 11/19/22  3:43 AM  Result Value  Ref Range   Sodium 134 (L) 135 - 145 mmol/L   Potassium 2.8 (L) 3.5 - 5.1 mmol/L   Chloride 104 98 - 111 mmol/L   CO2 23 22 - 32 mmol/L   Glucose, Bld 110 (H) 70 - 99 mg/dL    Comment: Glucose reference range applies only to samples taken after fasting for at least 8 hours.   BUN 11 6 - 20 mg/dL   Creatinine, Ser 0.73 0.44 - 1.00 mg/dL   Calcium 7.8 (L) 8.9 - 10.3 mg/dL   GFR, Estimated >60 >60 mL/min    Comment: (NOTE) Calculated using the CKD-EPI Creatinine Equation (2021)    Anion gap 7 5 - 15  Comment: Performed at [facility], [place] [address], [place], [place] [address]  Magnesium     Status: None   Collection Time: 11/19/22  3:43 AM  Result Value Ref Range   Magnesium 2.1 1.7 - 2.4 mg/dL    Comment: Performed at [facility], [place] [address] [place],  [address]   No results found.    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 Randel Pigg today 11/28 - continue a liquid diet  - await surgical path - will likely need partial colectomy vs subtotal colectomy this admission, surgical planning pending biopsy results.  - CT abd performed on 11/24 w/ thickening of the ascending colon and mesenteric lymph nodes, thickening of sigmoid colon; the liver was not completely imaged on this study. If path malignant, pt will likely need more complete abdominal imagining in addition to CT chest. I have ordered a CEA for the morning.  Rash with bullous changes  - ID consulted and thinks biopsy of the edge of a bullae, as well as biopsy of the edge of a lesion with more of an EM appearance. Their DDx: erythema multiforme, paraneoplastic rash, drug related reaction after starting bentyl.  - plan to perform punch biopsy in the OR at the time of surgery.  Anemia, acute on chronic; related to colon mass; s/p transfusion  of 2 u pRBC 11/24 for hgb 6.8, hgb is 9.2 today.   FEN: CLD  ID: none indicated from surgical perspective VTE: SCD's, not on chemical VTE ppx in the setting of anemia requiring transfusion  Dispo: med-surg, await path     Jill Alexanders, [facility] Surgery Please see Amion for pager number during day hours 7:00am-4:30pm 11/19/2022, 11:48 AM

## 2022-11-19 NOTE — Transfer of Care (Signed)
Immediate Anesthesia Transfer of Care Note  Patient: Ellen Hill  Procedure(s) Performed: COLONOSCOPY WITH PROPOFOL BIOPSY SUBMUCOSAL TATTOO INJECTION  Patient Location: Endoscopy Unit  Anesthesia Type:MAC  Level of Consciousness: oriented, drowsy, and patient cooperative  Airway & Oxygen Therapy: Patient Spontanous Breathing and Patient connected to face mask oxygen  Post-op Assessment: Report given to RN and Post -op Vital signs reviewed and stable  Post vital signs: Reviewed  Last Vitals:  Vitals Value Taken Time  BP 121/73 11/19/22 1301  Temp 36.3 C 11/19/22 1301  Pulse 118 11/19/22 1302  Resp 18 11/19/22 1303  SpO2 88 % 11/19/22 1302  Vitals shown include unvalidated device data.  Last Pain:  Vitals:   11/19/22 1301  TempSrc: Temporal  PainSc: 0-No pain      Patients Stated Pain Goal: 3 (28/11/88 6773)  Complications: No notable events documented.

## 2022-11-19 NOTE — Progress Notes (Signed)
PROGRESS NOTE    Ellen Hill [facility]  LNL:[id number] DOB: [birth year] DOA: 11/14/2022 PCP: [facility]    Chief Complaint  Patient presents with   Allergic Reaction    Brief Narrative:  Patient is a 51 year old female history of paraplegia as a result of laminectomy, history of chronic anemia, morbid obesity, hypertension, constipation presented with a rash ongoing x 2 weeks which had developed on her anterior trunk and extremities and abdomen started on doxycycline and treated with steroids however no significant improvement.  Patient noted not to have any mucosal involvement or rash of the palms of the hands and the feet no involvement of the posterior body.  Patient also noted to have a history of drug-induced constipation.  Patient noted on 10/30/2022 to be started on Bentyl however reported rash started on her legs progressed up to 2 days after starting Bentyl and using steroids only made rash worse and spread with a burning and itching sensation.  Patient noted to be followed by GI for chronic anemia and was to undergo endoscopy in the OR unfortunately developed rash and unable to get this done.  Patient presented to the ED, noted to have a significant anemia with a hemoglobin of 6.8 and admitted.  Patient transfused 2 units packed red blood cells.  Posttransfusion CBC pending.  CT abdomen and pelvis done due to concern for ileus with circumferential irregular thickening of the ascending colon proximal to the hepatic flexure, small mesenteric lymph nodes findings concerning for colonic neoplasm, second potential site of thickening in the sigmoid also raising concern for colorectal neoplasm, significant fecal impaction, left ureteral calculus/calculi with mild fullness of the left ureter stable appearance of intra renal calculi on the left since previous imaging, signs of body wall edema.  GI consulted for further evaluation.  Patient for colonoscopy 11/19/2022.  ID also consulted  for evaluation of rash.   Assessment & Plan:   Active Problems:   Class 3 severe obesity due to excess calories without serious comorbidity with body mass index (BMI) of 60.0 to 69.9 in adult (HCC)   Anemia   Leukocytosis   Essential hypertension   Hypokalemia   Bullous rash   Constipation   Ileus (HCC)   Erythema multiforme   Symptomatic anemia  #1 rash -Patient noted to have developed a rash for the past 2 weeks which have worsened, rash with bullous changes and per ID concern for erythema multiforme with no severe sloughing or Nikolsky sign noted. -Patient with no mucosal involvement and no prior HSV infection. -HIV negative.  RPR nonreactive.  Respiratory viral PCR negative. -Mycoplasma IgM <770.  -ID recommending general surgery evaluation for punch biopsy. -ID feels could be possibly related to Bentyl. -Also concern for possible paraneoplastic rash. -ID recommending calamine lotion for itching and will need outpatient follow-up with dermatology. -Hydroxyzine as needed.  2.  Severe iron deficiency anemia -Patient with no overt bleeding. -FOBT negative. -Hemoglobin noted at 6.8 on admission (11/14/2022). -Status posttransfusion 2 units packed red blood cells, hemoglobin this morning at 9.2 this morning. -Anemia panel with iron level of 12, ferritin of 3, folate of 4.6. -CT abdomen and pelvis with circumferential irregular thickening of the ascending colon proximal to the hepatic flexure with adjacent small mesenteric lymph nodes concerning for colonic neoplasm, potential second site in the sigmoid also raising concern for additional colorectal neoplasm of the mid sigmoid colon less likely colitis.  Fecal impaction also noted. -GI consulted for further evaluation and patient started on a  bowel prep for colonoscopy.  Status post 2 to 3 days bowel prep which has been completed and patient awaiting colonoscopy today, 11/19/2022 for further evaluation.  -Currently NPO. -Per GI.   -Follow H&H, transfusion threshold hemoglobin < 7.  3.  Hypokalemia -Secondary to GI losses.   -Patient status post bowel prep for the past 2 to 3 days.   -Potassium at 2.8.   -Magnesium at 2.1.   -K-Dur 40 mEq p.o. every 4 hours x 2 doses.  KCl 10 mEq IV x 2 rounds.   -Repeat labs in AM.   4.  Constipation -Likely drug-induced. -Disimpaction ordered initially on admission.. -Patient was placed on Dulcolax suppository and Senokot as also received a smog enema.   -Status post bowel prep for in anticipation of colonoscopy today.  -Post colonoscopy will likely need to be placed on a bowel regimen. -GI following.  5.  Hypertension -BP currently controlled off antihypertensive medications. -Follow.  6.  Morbid obesity -Lifestyle modification -Outpatient follow-up with PCP.  7.  Leukocytosis -With a worsening leukocytosis initially but trending back down.. -Currently afebrile. -Repeat UA with trace leukocytes, negative nitrites, 11-20 WBCs.  Urine cultures with 10,000 colonies of Enterococcus faecalis.  Blood cultures pending with no growth to date.  Respiratory viral panel negative. -Chest x-ray with no acute abnormalities.. -Patient nontoxic-appearing. -Monitor off antibiotics at this time and repeat labs in the AM.   DVT prophylaxis: SCDs Code Status: Full Family Communication: Updated patient.  No family at bedside. Disposition: Likely SNF once cleared by gastroenterology, and workup completed.  Status is: Inpatient Remains inpatient appropriate because: Severity of illness.  Needing further evaluation for severe iron deficiency anemia.   Consultants:  Gastroenterology: Dr. Paulita Fujita 11/15/2022 ID: Dr. Gale Journey 11/15/2022  Procedures:  Transfusion 2 units packed red blood cells CT abdomen and pelvis 11/15/2022 Abdominal films 11/14/2022 Chest x-ray 11/14/2022  Antimicrobials:  Anti-infectives (From admission, onward)    Start     Dose/Rate Route Frequency Ordered Stop    11/14/22 1545  cefTRIAXone (ROCEPHIN) 1 g in sodium chloride 0.9 % 100 mL IVPB        1 g 200 mL/hr over 30 Minutes Intravenous  Once 11/14/22 1539 11/15/22 0700         Subjective: Patient laying in bed.  Denies any chest pain or shortness of breath.  Just got cleaned up awaiting for colonoscopy today.  Completed bowel prep.  Interested in possibly going to a SNF.  Asking to speak to case manager.   Objective: Vitals:   11/18/22 0507 11/18/22 1417 11/18/22 1953 11/19/22 0541  BP: 126/80 121/79 130/78 122/66  Pulse: (!) 103 97 100 100  Resp: '16 15 18 18  '$ Temp: 98 F (36.7 C) 97.8 F (36.6 C) 98.5 F (36.9 C) 98.1 F (36.7 C)  TempSrc: Oral Oral Oral Oral  SpO2: 100% 100% 100% 100%  Weight:      Height:        Intake/Output Summary (Last 24 hours) at 11/19/2022 0907 Last data filed at 11/19/2022 0014 Gross per 24 hour  Intake 1895.7 ml  Output 1135 ml  Net 760.7 ml    Filed Weights   11/14/22 1346  Weight: (!) 163.3 kg    Examination:  General exam: NAD. Respiratory system: CTA B anterior lung fields.  No wheezes, no crackles, no rhonchi.  Fair air movement.  Speaking in full sentences.  Cardiovascular system: RRR no murmurs rubs or gallops.  No JVD.  No lower extremity edema.  Gastrointestinal system: Abdomen is obese, soft, nontender, nondistended, positive bowel sounds.  No rebound.  No guarding. Central nervous system: Alert and oriented. No focal neurological deficits. Extremities: Symmetric 5 x 5 power. Skin: Diffuse rash on torso and extremities with scattered small bullae with some ruptured.  No oral lesions noted.   Psychiatry: Judgement and insight appear normal. Mood & affect appropriate.                      Data Reviewed: I have personally reviewed following labs and imaging studies  CBC: Recent Labs  Lab 11/14/22 1456 11/16/22 1107 11/17/22 0305 11/18/22 0250 11/19/22 0343  WBC 16.8* 20.8* 25.8* 18.5* 18.0*  NEUTROABS  12.9* 14.9* 18.2* 12.6* 12.0*  HGB 6.8* 11.4* 11.1* 10.3* 9.2*  HCT 25.8* 39.3 38.5 35.8* 31.5*  MCV 62.8* 67.6* 68.1* 67.8* 68.2*  PLT 344 358 336 203 158     Basic Metabolic Panel: Recent Labs  Lab 11/14/22 1456 11/14/22 1939 11/16/22 1107 11/17/22 0305 11/18/22 0250 11/19/22 0343  NA 141  --  139 145 138 134*  K 3.3*  --  3.6 3.6 3.1* 2.8*  CL 110  --  105 110 107 104  CO2 25  --  23 18* 22 23  GLUCOSE 148*  --  88 128* 114* 110*  BUN 25*  --  '15 15 14 11  '$ CREATININE 0.78  --  0.76 0.79 0.62 0.73  CALCIUM 8.3*  --  8.3* 8.4* 8.2* 7.8*  MG  --  2.1 2.1 2.2 2.2 2.1  PHOS  --  2.8  --   --   --   --      GFR: Estimated Creatinine Clearance: 135.5 mL/min (by C-G formula based on SCr of 0.73 mg/dL).  Liver Function Tests: Recent Labs  Lab 11/14/22 1456 11/16/22 1107  AST 15 16  ALT 15 15  ALKPHOS 60 57  BILITOT 0.4 0.9  PROT 6.6 6.5  ALBUMIN 3.2* 3.2*     CBG: No results for input(s): "GLUCAP" in the last 168 hours.   Recent Results (from the past 240 hour(s))  Blood culture (routine x 2)     Status: None   Collection Time: 11/14/22  5:30 PM   Specimen: BLOOD RIGHT FOREARM  Result Value Ref Range Status   Specimen Description   Final    BLOOD RIGHT FOREARM Performed at [facility], [place] [address] [place], [place] [address]    Special Requests   Final    BOTTLES DRAWN AEROBIC AND ANAEROBIC Blood Culture adequate volume Performed at [facility] [address] [place], [place] [address]    Culture   Final    NO GROWTH 5 DAYS Performed at [facility], [place] [address], [place], [place] [address]    Report Status 11/19/2022 FINAL  Final  Blood culture (routine x 2)     Status: None   Collection Time: 11/14/22  7:39 PM   Specimen: BLOOD  Result Value Ref Range Status   Specimen Description   Final    BLOOD LEFT ANTECUBITAL Performed at [facility] [address] [place], [place] [address]     Special Requests   Final    BOTTLES DRAWN AEROBIC ONLY Blood Culture adequate volume Performed at [facility] [address], [place], [place] [address]    Culture   Final    NO GROWTH 5 DAYS Performed at [facility]  Lab, [address] [place], [place] [address]    Report Status 11/19/2022 FINAL  Final  Urine Culture     Status: Abnormal   Collection Time: 11/14/22 11:22 PM   Specimen: Urine, Catheterized  Result Value Ref Range Status   Specimen Description   Final    URINE, CATHETERIZED Performed at [facility] [address], [place], [place] [address]    Special Requests   Final    NONE Performed at [facility], [place] [address] [place], [place] [address]    Culture 10,000 COLONIES/mL ENTEROCOCCUS FAECALIS (A)  Final   Report Status 11/18/2022 FINAL  Final   Organism ID, Bacteria ENTEROCOCCUS FAECALIS (A)  Final      Susceptibility   Enterococcus faecalis - MIC*    AMPICILLIN <=2 SENSITIVE Sensitive     NITROFURANTOIN <=16 SENSITIVE Sensitive     VANCOMYCIN 1 SENSITIVE Sensitive     * 10,000 COLONIES/mL ENTEROCOCCUS FAECALIS  Respiratory (~20 pathogens) panel by PCR     Status: None   Collection Time: 11/15/22 10:35 AM   Specimen: Nasopharyngeal Swab; Respiratory  Result Value Ref Range Status   Adenovirus NOT DETECTED NOT DETECTED Final   Coronavirus 229E NOT DETECTED NOT DETECTED Final    Comment: (NOTE) The Coronavirus on the Respiratory Panel, DOES NOT test for the novel  Coronavirus (2019 nCoV)    Coronavirus HKU1 NOT DETECTED NOT DETECTED Final   Coronavirus NL63 NOT DETECTED NOT DETECTED Final   Coronavirus OC43 NOT DETECTED NOT DETECTED Final   Metapneumovirus NOT DETECTED NOT DETECTED Final   Rhinovirus / Enterovirus NOT DETECTED NOT DETECTED Final   Influenza A NOT DETECTED NOT DETECTED Final   Influenza B NOT DETECTED NOT DETECTED Final   Parainfluenza Virus 1 NOT DETECTED NOT DETECTED  Final   Parainfluenza Virus 2 NOT DETECTED NOT DETECTED Final   Parainfluenza Virus 3 NOT DETECTED NOT DETECTED Final   Parainfluenza Virus 4 NOT DETECTED NOT DETECTED Final   Respiratory Syncytial Virus NOT DETECTED NOT DETECTED Final   Bordetella pertussis NOT DETECTED NOT DETECTED Final   Bordetella Parapertussis NOT DETECTED NOT DETECTED Final   Chlamydophila pneumoniae NOT DETECTED NOT DETECTED Final   Mycoplasma pneumoniae NOT DETECTED NOT DETECTED Final    Comment: Performed at [facility], [place] [address], [place], [place] [address]  Urine Culture     Status: None   Collection Time: 11/17/22  1:02 PM   Specimen: Urine, Catheterized  Result Value Ref Range Status   Specimen Description   Final    URINE, CATHETERIZED Performed at [facility] [address] [place], [place] [address]    Special Requests   Final    NONE Performed at [facility], [place] [address] [place], [place] [address]    Culture   Final    NO GROWTH Performed at [facility], [place] [address] [place], [place] [address]    Report Status 11/18/2022 FINAL  Final         Radiology Studies: DG CHEST PORT 1 VIEW  Result Date: 11/17/2022 CLINICAL DATA:  Leukocytosis EXAM: PORTABLE CHEST 1 VIEW COMPARISON:  11/14/2022 FINDINGS: The lungs are clear without focal pneumonia, edema, pneumothorax or pleural effusion. Cardiopericardial silhouette is at upper limits of normal for size. The visualized bony structures of the thorax are unremarkable. Right PICC line tip overlies the mid SVC level. Telemetry leads overlie the chest. IMPRESSION: No active disease. Electronically Signed  By: Misty Stanley M.D.   On: 11/17/2022 11:10        Scheduled Meds:  bisacodyl  10 mg Rectal Daily   Chlorhexidine Gluconate Cloth  6 each Topical Daily   docusate sodium  100 mg Oral BID   feeding supplement  1 Container Oral TID BM   gabapentin  600 mg Oral TID   loratadine   10 mg Oral Daily   nystatin   Topical q12n4p   potassium chloride  40 mEq Oral Q4H   senna  1 tablet Oral BID   Continuous Infusions:  famotidine (PEPCID) IV 20 mg (09/98/33 8250)   folic acid 1 mg in sodium chloride 0.9 % 50 mL IVPB Stopped (11/18/22 1008)   potassium chloride 10 mEq (11/19/22 0833)     LOS: 5 days    Time spent: 40 minutes    Irine Seal, MD [facility]   To contact the attending provider between 7A-7P or the covering provider during after hours 7P-7A, please log into the web site [email or web address] and access using universal [facility] password for that web site. If you do not have the password, please call the hospital operator.  11/19/2022, 9:07 AM

## 2022-11-19 NOTE — Plan of Care (Signed)
  Problem: Fluid Volume: Goal: Hemodynamic stability will improve Outcome: Progressing   Problem: Clinical Measurements: Goal: Diagnostic test results will improve Outcome: Progressing   Problem: Health Behavior/Discharge Planning: Goal: Ability to manage health-related needs will improve Outcome: Progressing   Problem: Activity: Goal: Risk for activity intolerance will decrease Outcome: Progressing   Problem: Nutrition: Goal: Adequate nutrition will be maintained Outcome: Progressing   Problem: Coping: Goal: Level of anxiety will decrease Outcome: Progressing

## 2022-11-19 NOTE — Care Management Important Message (Signed)
Important Message  Patient Details IM Letter given Name: Ellen Hill MRN: [id number] Date of Birth: 24-Dec-1970   Medicare Important Message Given:  Yes     Kerin Salen 11/19/2022, 12:21 PM

## 2022-11-19 NOTE — Anesthesia Procedure Notes (Signed)
Procedure Name: MAC Date/Time: 11/19/2022 11:58 AM  Performed by: Jenne Campus, CRNAPre-anesthesia Checklist: Patient identified, Emergency Drugs available, Suction available and Patient being monitored Oxygen Delivery Method: Simple face mask Placement Confirmation: CO2 detector

## 2022-11-19 NOTE — Op Note (Signed)
[facility] Patient Name: Ellen Hill Procedure Date: 11/19/2022 MRN: [id number] Attending MD: Danton Clap DO, DO, [id number] Date of Birth: Nov 15, 1971 CSN: [id number] Age: 51 Admit Type: Outpatient Procedure:                Colonoscopy Indications:              This is the patient's first colonoscopy, Abnormal                            CT of the GI tract Providers:                Danton Clap DO, DO, Jaci Carrel, RN, Darliss Cheney, Technician, Luciana Axe, CRNA Referring MD:              Medicines:                See the Anesthesia note for documentation of the                            administered medications Complications:            No immediate complications. Estimated blood loss:                            Minimal. Estimated Blood Loss:     Estimated blood loss was minimal. Procedure:                Pre-Anesthesia Assessment:                           - ASA Grade Assessment: IV - A patient with severe                            systemic disease that is a constant threat to life.                           - The anesthesia plan was to use monitored                            anesthesia care (MAC).                           After obtaining informed consent, the colonoscope                            was passed under direct vision. Throughout the                            procedure, the patient's blood pressure, pulse, and                            oxygen saturations were monitored continuously.The                            colonoscopy was performed without  difficulty. The                            patient tolerated the procedure well. The quality                            of the bowel preparation was adequate. The                            CF-HQ190L ([id number] Olympus colonoscope was                            introduced through the anus and advanced to the the                            hepatic flexure. The colonoscopy  was somewhat                            difficult due to poor bowel prep with stool                            present. Successful completion of the procedure was                            aided by lavage. Scope In: 12:04:46 PM Scope Out: 12:50:23 PM Total Procedure Duration: 0 hours 45 minutes 37 seconds  Findings:      An ulcerated non-obstructing medium-sized mass was found in the       recto-sigmoid colon. The mass was non-circumferential. Oozing was       present. This was biopsied with a cold forceps for histology. Estimated       blood loss was minimal. Area distal to mass lesion was tattooed with an       injection of [place] ink.      An ulcerated partially obstructing large mass was found at the hepatic       flexure. The mass was partially circumferential. Oozing was present.       This was biopsied with a cold forceps for histology. Estimated blood       loss was minimal. Area distal to mass lesion was tattooed with an       injection of [place] ink.      The perianal and digital rectal examinations were normal. 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 Biopsied. Tattooed.                           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 Biopsied. Tattooed. Moderate Sedation:      See the other procedure note for documentation of moderate sedation with       intraservice time. Recommendation:           -  Return patient to hospital ward for ongoing care.                           - Perform a CT scan (computed tomography) of chest,                            abdomen and pelvis at the next available time slot.                           - Refer to a surgeon for further recommendations                            and management.                           - Refer to an oncologist for further                            recommendations and management.                            - Await pathology results. Procedure Code(s):        --- Professional ---                           [phone], 59, Colonoscopy, flexible; with biopsy,                            single or multiple                           45381, 52, Colonoscopy, flexible; with directed                            submucosal injection(s), any substance Diagnosis Code(s):        --- Professional ---                           D49.0, Neoplasm of unspecified behavior of                            digestive system                           R93.3, Abnormal findings on diagnostic imaging of                            other parts of digestive tract CPT copyright 2022 American Medical Association. All rights reserved. The codes documented in this report are preliminary and upon coder review may  be revised to meet current compliance requirements. Dr Danton Clap, [place], DO 11/19/2022 1:19:14 PM Number of Addenda: 0

## 2022-11-20 ENCOUNTER — Inpatient Hospital Stay (HOSPITAL_COMMUNITY): Payer: Medicare HMO

## 2022-11-20 DIAGNOSIS — K6389 Other specified diseases of intestine: Secondary | ICD-10-CM | POA: Diagnosis not present

## 2022-11-20 DIAGNOSIS — R21 Rash and other nonspecific skin eruption: Secondary | ICD-10-CM | POA: Diagnosis not present

## 2022-11-20 DIAGNOSIS — E876 Hypokalemia: Secondary | ICD-10-CM | POA: Diagnosis not present

## 2022-11-20 DIAGNOSIS — D500 Iron deficiency anemia secondary to blood loss (chronic): Secondary | ICD-10-CM | POA: Diagnosis not present

## 2022-11-20 DIAGNOSIS — K5900 Constipation, unspecified: Secondary | ICD-10-CM | POA: Diagnosis not present

## 2022-11-20 DIAGNOSIS — C189 Malignant neoplasm of colon, unspecified: Secondary | ICD-10-CM | POA: Diagnosis present

## 2022-11-20 LAB — BASIC METABOLIC PANEL
Anion gap: 6 (ref 5–15)
BUN: 10 mg/dL (ref 6–20)
CO2: 23 mmol/L (ref 22–32)
Calcium: 7.8 mg/dL — ABNORMAL LOW (ref 8.9–10.3)
Chloride: 110 mmol/L (ref 98–111)
Creatinine, Ser: 0.61 mg/dL (ref 0.44–1.00)
GFR, Estimated: >60 mL/min (ref >60)
Glucose, Bld: 92 mg/dL (ref 70–99)
Potassium: 3.5 mmol/L (ref 3.5–5.1)
Sodium: 139 mmol/L (ref 135–145)

## 2022-11-20 LAB — CBC WITH DIFFERENTIAL/PLATELET
Abs Immature Granulocytes: 0.08 10*3/uL — ABNORMAL HIGH (ref 0.00–0.07)
Basophils Absolute: 0 10*3/uL (ref 0.0–0.1)
Basophils Relative: 0 %
Eosinophils Absolute: 1.6 10*3/uL — ABNORMAL HIGH (ref 0.0–0.5)
Eosinophils Relative: 12 %
HCT: 31.2 % — ABNORMAL LOW (ref 36.0–46.0)
Hemoglobin: 9.1 g/dL — ABNORMAL LOW (ref 12.0–15.0)
Immature Granulocytes: 1 %
Lymphocytes Relative: 14 %
Lymphs Abs: 2 10*3/uL (ref 0.7–4.0)
MCH: 20 pg — ABNORMAL LOW (ref 26.0–34.0)
MCHC: 29.2 g/dL — ABNORMAL LOW (ref 30.0–36.0)
MCV: 68.7 fL — ABNORMAL LOW (ref 80.0–100.0)
Monocytes Absolute: 0.8 10*3/uL (ref 0.1–1.0)
Monocytes Relative: 5 %
Neutro Abs: 9.7 10*3/uL — ABNORMAL HIGH (ref 1.7–7.7)
Neutrophils Relative %: 68 %
Platelets: 149 10*3/uL — ABNORMAL LOW (ref 150–400)
RBC: 4.54 MIL/uL (ref 3.87–5.11)
RDW: 30.8 % — ABNORMAL HIGH (ref 11.5–15.5)
WBC: 14.3 10*3/uL — ABNORMAL HIGH (ref 4.0–10.5)
nRBC: 0 % (ref 0.0–0.2)

## 2022-11-20 LAB — MAGNESIUM: Magnesium: 2.2 mg/dL (ref 1.7–2.4)

## 2022-11-20 MED ORDER — POTASSIUM CHLORIDE CRYS ER 20 MEQ PO TBCR
40.0000 meq | EXTENDED_RELEASE_TABLET | Freq: Two times a day (BID) | ORAL | Status: DC
  Filled 2022-11-20 (×2): qty 2

## 2022-11-20 MED ORDER — DIPHENHYDRAMINE HCL 50 MG/ML IJ SOLN: 12.5000 mg | Freq: Four times a day (QID) | INTRAMUSCULAR | Status: DC | PRN

## 2022-11-20 MED ORDER — METHYLPREDNISOLONE SODIUM SUCC 125 MG IJ SOLR
125.0000 mg | Freq: Once | INTRAMUSCULAR | Status: AC
  Administered 2022-11-20: 125 mg via INTRAVENOUS
  Filled 2022-11-20: qty 2

## 2022-11-20 MED ORDER — HYDROXYZINE HCL 25 MG PO TABS
25.0000 mg | ORAL_TABLET | Freq: Three times a day (TID) | ORAL | Status: DC | PRN
  Administered 2022-11-20 – 2022-12-06 (×15): 50 mg via ORAL
  Filled 2022-11-20 (×15): qty 2

## 2022-11-20 MED ORDER — HYDROCORTISONE 1 % EX CREA
TOPICAL_CREAM | Freq: Two times a day (BID) | CUTANEOUS | Status: DC
  Filled 2022-11-20 (×2): qty 28

## 2022-11-20 MED ORDER — FOLIC ACID 5 MG/ML IJ SOLN
1.0000 mg | Freq: Every day | INTRAMUSCULAR | Status: DC
  Administered 2022-11-21 – 2022-11-28 (×7): 1 mg via INTRAVENOUS
  Filled 2022-11-20 (×8): qty 0.2

## 2022-11-20 NOTE — Progress Notes (Signed)
PT Cancellation Note  Patient Details Name: Ellen Hill MRN: [id number] DOB: 12-29-1970   Cancelled Treatment:    Reason Eval/Treat Not Completed: Other (comment) RN reports pt received diagnosis this morning.  Pt not feeling up to participating today.   [name] 11/20/2022, 9:33 AM Jannette Spanner PT, DPT Physical Therapist [facility] Preferred contact method: Secure Chat Weekend Pager Only: [phone] Office: [phone]

## 2022-11-20 NOTE — Consult Note (Signed)
[facility] Nurse Consult Note: Reason for Consult: wounds from rash Wound type: Patient is paraplegic after laminectomy. Total care at home.  Reviewed notes from [facility]/Gastroenterology/ID Per surgery: ID consulted and thinks biopsy of the edge of a bullae, as well as biopsy of the edge of a lesion with more of an EM appearance. Their DDx: erythema multiforme, paraneoplastic rash, drug related reaction after starting bentyl.  Patient to also noted to have significant recto-sigmoid mass concerning for malignancy unclear if rash could be related.  Vesicular rash with blistering of LEs Pressure Injury POA: NA Measurement:NA Wound bed:see above  Drainage (amount, consistency, odor) none Periwound: intact  Dressing procedure/placement/frequency: Topical care ordered for any open lesions and blistering. No other topical care recommend for rash at this time.  Treatment with topicals for rash is considered outside the scope of practice for [place].    Re consult if needed, will not follow at this time. Thanks  Zamoria Boss [employer], RN,CWOCN, CNS, [place] [phone])

## 2022-11-20 NOTE — Progress Notes (Signed)
OT Cancellation Note  Patient Details Name: Ellen Hill MRN: [id number] DOB: 08/08/1971   Cancelled Treatment:    Reason Eval/Treat Not Completed: Other (comment). Patient declined therapy today. RN reports patient overwhelmed due to new diagnosis. Will continue to follow.  Darlina Mccaughey L Kenesha Moshier 11/20/2022, 10:43 AM

## 2022-11-20 NOTE — Progress Notes (Signed)
PROGRESS NOTE    [facility]  ZCH:[id number] DOB: August 07, 1971 DOA: 11/14/2022 PCP: [facility]   Brief Narrative:  The Patient is a 51 year old female history of paraplegia as a result of laminectomy, history of chronic anemia, morbid obesity, hypertension, constipation presented with a rash ongoing x 2 weeks which had developed on her anterior trunk and extremities and abdomen started on doxycycline and treated with steroids however no significant improvement.  Patient noted not to have any mucosal involvement or rash of the palms of the hands and the feet no involvement of the posterior body.  Patient also noted to have a history of drug-induced constipation.  Patient noted on 10/30/2022 to be started on Bentyl however reported rash started on her legs progressed up to 2 days after starting Bentyl and using steroids only made rash worse and spread with a burning and itching sensation.  Patient noted to be followed by GI for chronic anemia and was to undergo endoscopy in the OR unfortunately developed rash and unable to get this done.  Patient presented to the ED, noted to have a significant anemia with a hemoglobin of 6.8 and admitted.  Patient transfused 2 units packed red blood cells.  Posttransfusion CBC pending.  CT abdomen and pelvis done due to concern for ileus with circumferential irregular thickening of the ascending colon proximal to the hepatic flexure, small mesenteric lymph nodes findings concerning for colonic neoplasm, second potential site of thickening in the sigmoid also raising concern for colorectal neoplasm, significant fecal impaction, left ureteral calculus/calculi with mild fullness of the left ureter stable appearance of intra renal calculi on the left since previous imaging, signs of body wall edema.  GI consulted for further evaluation.  Patient for colonoscopy 11/19/2022.  ID also consulted for evaluation of rash.    Assessment and  Plan:   Rash -Patient noted to have developed a rash for the past 2 weeks which have worsened, rash with bullous changes and per ID concern for erythema multiforme with no severe sloughing or Nikolsky sign noted. -Patient with no mucosal involvement and no prior HSV infection. -HIV negative.  RPR nonreactive.  Respiratory viral PCR negative. -Mycoplasma IgM <770.  -ID recommending general surgery evaluation for punch biopsy and will be done  -ID feels could be possibly related to Bentyl. -Also concern for possible paraneoplastic rash. -ID recommending calamine lotion for itching and will need outpatient follow-up with dermatology. 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 -FOBT negative. -Hemoglobin noted at 6.8 on admission (11/14/2022). -Status posttransfusion 2 units packed red blood cells, hemoglobin this morning at 9.2 this morning. -Anemia panel with iron level of 12, ferritin of 3, folate of 4.6. -CT abdomen and pelvis with circumferential irregular thickening of the ascending colon proximal to the hepatic flexure with adjacent small mesenteric lymph nodes concerning for colonic neoplasm, potential second site in the sigmoid also raising concern for additional colorectal neoplasm of the mid sigmoid colon less likely colitis.  Fecal impaction also noted. -GI consulted for further evaluation and patient started on a bowel prep for colonoscopy.  Status post 2 to 3 days bowel prep which has  been completed and patient underwent colonoscopy which showed an ulcerated nonobstructing medium-sized mass found in the rectosigmoid colon where the mass was noncircumferential and oozing was  present.  This was biopsied with a cold forceps for histology.  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 Enlarged bilateral axillary lymph nodes are indeterminate. Abnormal appearance of the sternal manubrium. Could not exclude a permeative neoplastic process. Recommend correlation any sternal pain or tenderness. MRI may be helpful for further evaluation. Postoperative changes involving the thoracic spine with previouslaminectomies and benign-appearing postoperative fluid collection in the subcutaneous fat." -Follow H&H, transfusion threshold hemoglobin < 7. -Oncology has been consulted for further evaluation and recommendations   Hypokalemia -Secondary to GI losses.   -Patient status post bowel prep for the past 2 to 3 days.   -Potassium at 2.8 yesterday and improved to 3.5.   -Magnesium at 2.2 -Continue potassium repletion and magnesium monitoring -Repeat CMP in the a.m.   Constipation -Likely drug-induced. -Disimpaction ordered initially on admission.. -Patient was placed on Dulcolax suppository and Senokot as also received a smog enema.   -Status post bowel prep for in anticipation of colonoscopy .  -Post colonoscopy will likely need to be placed on a bowel regimen. -GI following [facility]   Hypertension -BP currently controlled off antihypertensive medications. -Continue to Monitor BP per Protocol -Last BP reading was .   Super Morbid Obesity -Complicates overall prognosis and  care -Estimated body mass index is 58.11 kg/m as calculated from the following:   Height as of this encounter: '5\' 6"'$  (1.676 m).   Weight as of this encounter: 163.3 kg.  -Weight Loss and Dietary Counseling given and will need continual Lifestyle modification -Outpatient follow-up with PCP.   Leukocytosis -With a worsening leukocytosis initially but trending back down.  As WBC has gone from 20.8 -> 25.8 -> 18.5 -> 18.0 -> 14.3 -Currently afebrile. -Repeat UA with trace leukocytes, negative nitrites, 11-20 WBCs.  Urine cultures with 10,000 colonies of Enterococcus faecalis.  Blood cultures pending with no growth to date.  Respiratory viral panel negative. -Chest x-ray with no acute abnormalities.. -Patient nontoxic-appearing. -Monitor off antibiotics at this time and repeat labs in the AM.  Thrombocytopenia -The patient's platelet count dropped from 358 -> 336 -> 203 -> 158 -> 149 -Continue to monitor for signs and symptoms of bleeding; no overt bleeding noted -Repeat CBC in a.m.  Paraplegia -As a result of a laminectomy -Continue supportive care  DVT prophylaxis: SCDs Start: 11/14/22 2005    Code Status: Full Code Family Communication: No family currently at bedside  Disposition Plan:  Level of care: Progressive Status is: Inpatient Remains inpatient appropriate because: He is further inpatient clinical workup for   Consultants:  Gastroenterology General Surgery Medical Oncology   Procedures:  COLONOSCOPY Findings:      An ulcerated non-obstructing medium-sized mass was found in the       recto-sigmoid colon. The mass was non-circumferential. Oozing was       present. This was biopsied with a cold forceps for  histology. Estimated       blood loss was minimal. Area distal to mass lesion was tattooed with an       injection of [place] ink.      An ulcerated partially obstructing large mass was found at the hepatic       flexure. The mass was partially circumferential.  Oozing was present.       This was biopsied with a cold forceps for histology. Estimated blood       loss was minimal. Area distal to mass lesion was tattooed with an       injection of [place] ink.      The perianal and digital rectal examinations were normal. 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 Biopsied. Tattooed.                           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 Biopsied. Tattooed. Moderate Sedation:      See the other procedure note for documentation of moderate sedation with       intraservice time. Recommendation:           - Return patient to hospital ward for ongoing care.                           - Perform a CT scan (computed tomography) of chest,                            abdomen and pelvis at the next available time slot.                           - Refer to a surgeon for further recommendations                            and management.                           - Refer to an oncologist for further                            recommendations and management.  Antimicrobials:  Anti-infectives (From admission, onward)    Start     Dose/Rate Route Frequency Ordered Stop   11/14/22 1545  cefTRIAXone (ROCEPHIN) 1 g in sodium chloride 0.9 % 100 mL IVPB        1 g 200 mL/hr over 30 Minutes Intravenous  Once 11/14/22 1539 11/15/22 0700       Subjective: Seen and examined at bedside and states that she was itching all over and thinks that her rash is getting worse.  No nausea or vomiting.  Denies any abdominal pain.Marland Kitchen  Her main complaint is about the rash and how she states is worsening.  She denies any other concerns or complaints at  this time.  Objective: Vitals:   11/19/22 1342 11/19/22 1416 11/19/22 2101 11/20/22 0502  BP: 119/65 139/75 (!) 119/48 (!) 109/53  Pulse: 92 96 (!) 110 (!) 108  Resp: '17 16 18 20  '$ Temp:  98 F (36.7 C)  99.1 F (37.3 C) 98.1 F (36.7 C)  TempSrc:  Axillary    SpO2: 97% 100% 100% 99%  Weight:      Height:        Intake/Output Summary (Last 24 hours) at 11/20/2022 1623 Last data filed at 11/20/2022 0700 Gross per 24 hour  Intake 50 ml  Output 950 ml  Net -900 ml   Filed Weights   11/14/22 1346  Weight: (!) 163.3 kg   Examination: Physical Exam:  Constitutional: WN/WD super morbidly obese paraplegic African-American female Respiratory: Diminished to auscultation bilaterally, no wheezing, rales, rhonchi or crackles. Normal respiratory effort and patient is not tachypenic. No accessory muscle use.  Unlabored breathing Cardiovascular: RRR, no murmurs / rubs / gallops. S1 and S2 auscultated. No extremity edema.  Abdomen: Soft, non-tender, distended secondary to body habitus. Bowel sounds positive.  GU: Deferred. Musculoskeletal: No clubbing / cyanosis of digits/nails. Normal strength and muscle tone.  Skin: Has a diffuse rash noted on her skin with some bullae and some vesicles that are broken and lesions where she scratched the upper arms chest area and legs Neurologic: CN 2-12 grossly intact with no focal deficits.  Romberg sign and cerebellar reflexes not assessed.  Psychiatric: Normal judgment and insight. Alert and oriented x 3. Normal mood and appropriate affect.   Data Reviewed: I have personally reviewed following labs and imaging studies  CBC: Recent Labs  Lab 11/16/22 1107 11/17/22 0305 11/18/22 0250 11/19/22 0343 11/20/22 0407  WBC 20.8* 25.8* 18.5* 18.0* 14.3*  NEUTROABS 14.9* 18.2* 12.6* 12.0* 9.7*  HGB 11.4* 11.1* 10.3* 9.2* 9.1*  HCT 39.3 38.5 35.8* 31.5* 31.2*  MCV 67.6* 68.1* 67.8* 68.2* 68.7*  PLT 358 336 203 158 017*   Basic Metabolic Panel: Recent Labs  Lab 11/14/22 1939 11/16/22 1107 11/17/22 0305 11/18/22 0250 11/19/22 0343 11/20/22 0407  NA  --  139 145 138 134* 139  K  --  3.6 3.6 3.1* 2.8* 3.5  CL  --  105 110 107 104 110  CO2  --  23  18* '22 23 23  '$ GLUCOSE  --  88 128* 114* 110* 92  BUN  --  '15 15 14 11 10  '$ CREATININE  --  0.76 0.79 0.62 0.73 0.61  CALCIUM  --  8.3* 8.4* 8.2* 7.8* 7.8*  MG 2.1 2.1 2.2 2.2 2.1 2.2  PHOS 2.8  --   --   --   --   --    GFR: Estimated Creatinine Clearance: 135.5 mL/min (by C-G formula based on SCr of 0.61 mg/dL). Liver Function Tests: Recent Labs  Lab 11/14/22 1456 11/16/22 1107  AST 15 16  ALT 15 15  ALKPHOS 60 57  BILITOT 0.4 0.9  PROT 6.6 6.5  ALBUMIN 3.2* 3.2*   No results for input(s): "LIPASE", "AMYLASE" in the last 168 hours. No results for input(s): "AMMONIA" in the last 168 hours. Coagulation Profile: Recent Labs  Lab 11/14/22 1939  INR 1.2   Cardiac Enzymes: No results for input(s): "CKTOTAL", "CKMB", "CKMBINDEX", "TROPONINI" in the last 168 hours. BNP (last 3 results) No results for input(s): "PROBNP" in the last 8760 hours. HbA1C: No results for input(s): "HGBA1C" in the last 72 hours. CBG: No results for  input(s): "GLUCAP" in the last 168 hours. Lipid Profile: No results for input(s): "CHOL", "HDL", "LDLCALC", "TRIG", "CHOLHDL", "LDLDIRECT" in the last 72 hours. Thyroid Function Tests: No results for input(s): "TSH", "T4TOTAL", "FREET4", "T3FREE", "THYROIDAB" in the last 72 hours. Anemia Panel: No results for input(s): "VITAMINB12", "FOLATE", "FERRITIN", "TIBC", "IRON", "RETICCTPCT" in the last 72 hours. Sepsis Labs: Recent Labs  Lab 11/14/22 1730 11/14/22 1939 11/14/22 2139  PROCALCITON  --  <0.10  --   LATICACIDVEN 1.7  --  2.1*    Recent Results (from the past 240 hour(s))  Blood culture (routine x 2)     Status: None   Collection Time: 11/14/22  5:30 PM   Specimen: BLOOD RIGHT FOREARM  Result Value Ref Range Status   Specimen Description   Final    BLOOD RIGHT FOREARM Performed at [facility] [address] [place], [place] [address]    Special Requests   Final    BOTTLES DRAWN AEROBIC AND ANAEROBIC Blood Culture adequate  volume Performed at [facility] [address], [place], [place] [address]    Culture   Final    NO GROWTH 5 DAYS Performed at [facility], [place] [address], [place], [place] [address]    Report Status 11/19/2022 FINAL  Final  Blood culture (routine x 2)     Status: None   Collection Time: 11/14/22  7:39 PM   Specimen: BLOOD  Result Value Ref Range Status   Specimen Description   Final    BLOOD LEFT ANTECUBITAL Performed at [facility] [address] [place], [place] [address]    Special Requests   Final    BOTTLES DRAWN AEROBIC ONLY Blood Culture adequate volume Performed at [facility] [address] [place], [place] [address]    Culture   Final    NO GROWTH 5 DAYS Performed at [facility], [place] [address], [place], [place] [address]    Report Status 11/19/2022 FINAL  Final  Urine Culture     Status: Abnormal   Collection Time: 11/14/22 11:22 PM   Specimen: Urine, Catheterized  Result Value Ref Range Status   Specimen Description   Final    URINE, CATHETERIZED Performed at [facility] [address], [place], [place] [address]    Special Requests   Final    NONE Performed at [facility], [place] [address], [place],  [address]    Culture 10,000 COLONIES/mL ENTEROCOCCUS FAECALIS (A)  Final   Report Status 11/18/2022 FINAL  Final   Organism ID, Bacteria ENTEROCOCCUS FAECALIS (A)  Final      Susceptibility   Enterococcus faecalis - MIC*    AMPICILLIN <=2 SENSITIVE Sensitive     NITROFURANTOIN <=16 SENSITIVE Sensitive     VANCOMYCIN 1 SENSITIVE Sensitive     * 10,000 COLONIES/mL ENTEROCOCCUS FAECALIS  Respiratory (~20 pathogens) panel by PCR     Status: None   Collection Time: 11/15/22 10:35 AM   Specimen: Nasopharyngeal Swab; Respiratory  Result Value Ref Range Status   Adenovirus NOT DETECTED NOT DETECTED Final   Coronavirus 229E NOT DETECTED NOT  DETECTED Final    Comment: (NOTE) The Coronavirus on the Respiratory Panel, DOES NOT test for the novel  Coronavirus (2019 nCoV)    Coronavirus HKU1 NOT DETECTED NOT DETECTED Final   Coronavirus NL63 NOT DETECTED NOT DETECTED Final   Coronavirus OC43 NOT DETECTED NOT DETECTED Final   Metapneumovirus  NOT DETECTED NOT DETECTED Final   Rhinovirus / Enterovirus NOT DETECTED NOT DETECTED Final   Influenza A NOT DETECTED NOT DETECTED Final   Influenza B NOT DETECTED NOT DETECTED Final   Parainfluenza Virus 1 NOT DETECTED NOT DETECTED Final   Parainfluenza Virus 2 NOT DETECTED NOT DETECTED Final   Parainfluenza Virus 3 NOT DETECTED NOT DETECTED Final   Parainfluenza Virus 4 NOT DETECTED NOT DETECTED Final   Respiratory Syncytial Virus NOT DETECTED NOT DETECTED Final   Bordetella pertussis NOT DETECTED NOT DETECTED Final   Bordetella Parapertussis NOT DETECTED NOT DETECTED Final   Chlamydophila pneumoniae NOT DETECTED NOT DETECTED Final   Mycoplasma pneumoniae NOT DETECTED NOT DETECTED Final    Comment: Performed at [facility], [place] [address], [place], [place] [address]  Urine Culture     Status: None   Collection Time: 11/17/22  1:02 PM   Specimen: Urine, Catheterized  Result Value Ref Range Status   Specimen Description   Final    URINE, CATHETERIZED Performed at [facility] [address], [place], [place] [address]    Special Requests   Final    NONE Performed at [facility] [place] [address] [place], [place] [address]    Culture   Final    NO GROWTH Performed at [facility], [place] [address] [place],  [address]    Report Status 11/18/2022 FINAL  Final    Radiology Studies: CT CHEST WO CONTRAST  Result Date: 11/20/2022 CLINICAL DATA:  Colon cancer staging.  * Tracking Code: BO * EXAM: CT CHEST WITHOUT CONTRAST TECHNIQUE: Multidetector CT imaging of the chest was performed following the standard protocol without IV  contrast. RADIATION DOSE REDUCTION: This exam was performed according to the departmental dose-optimization program which includes automated exposure control, adjustment of the mA and/or kV according to patient size and/or use of iterative reconstruction technique. COMPARISON:  CT abdomen/pelvis 11/15/2022 FINDINGS: Cardiovascular: The heart is within normal limits in size. No pericardial effusion. The aorta is normal in caliber. No atherosclerotic calcifications. The right-sided PICC line is noted in good position. Mediastinum/Nodes: No mediastinal or hilar mass or lymphadenopathy. The esophagus is unremarkable. There are enlarged bilateral axillary lymph nodes which are indeterminate. No supraclavicular adenopathy. Lungs/Pleura: No acute pulmonary process. No worrisome pulmonary lesions or pulmonary nodules to suggest metastatic disease. Small calcified granuloma noted in the right lower lobe. No pleural effusions or pleural nodules. Upper Abdomen: No significant upper abdominal findings. No hepatic lesions are identified. Status post cholecystectomy. Musculoskeletal: Postoperative changes involving the thoracic spine with previous laminectomies and benign-appearing postoperative fluid collection in the subcutaneous fat. Abnormal appearance of the sternal manubrium. Has a somewhat permeative appearance. Could not exclude a neoplastic process. Recommend correlation any sternal pain or tenderness. MRI may be helpful for further evaluation. IMPRESSION: 1. No worrisome pulmonary lesions or pulmonary nodules to suggest metastatic disease. 2. Enlarged bilateral axillary lymph nodes are indeterminate. 3. Abnormal appearance of the sternal manubrium. Could not exclude a permeative neoplastic process. Recommend correlation any sternal pain or tenderness. MRI may be helpful for further evaluation. 4. Postoperative changes involving the thoracic spine with previous laminectomies and benign-appearing postoperative fluid  collection in the subcutaneous fat. Electronically Signed   By: Marijo Sanes M.D.   On: 11/20/2022 09:29    Scheduled Meds:  bisacodyl  10 mg Rectal Daily   Chlorhexidine Gluconate Cloth  6 each Topical Daily   docusate sodium  100 mg Oral BID  feeding supplement  1 Container Oral TID BM   [START ON 24/23/5361] folic acid  1 mg Intravenous Daily   gabapentin  600 mg Oral TID   lidocaine-EPINEPHrine  10 mL Intradermal Once   loratadine  10 mg Oral Daily   nystatin   Topical q12n4p   senna  1 tablet Oral BID   Continuous Infusions:  famotidine (PEPCID) IV 20 mg (11/20/22 1203)    LOS: 6 days   Raiford Noble, DO [facility] Available via Epic secure chat 7am-7pm After these hours, please refer to coverage provider listed on [email or web address] 11/20/2022, 4:23 PM

## 2022-11-20 NOTE — Consult Note (Addendum)
[place]  [phone]   HEMATOLOGY ONCOLOGY INPATIENT CONSULTATION   Ellen Hill  DOB: 25-Oct-1971  MR#: [id number]  CSN#: [id number]    Requesting Physician: [facility]  Patient Care Team: [facility] as PCP - General  Reason for consult: colon masses   History of present illness:   Ms. 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  He received 2 unit of blood transfusion, CT abdomen pelvis showed circumferential irregular thickening of the ascending colon proximal to the hepatic flexure, small mesenteric lymph nodes, and a second potential site of thickening in the sigmoid colon, concerning for colon cancer.  She underwent colonoscopy yesterday, which showed two masses in the hepatic flexure and sigmoid colon, biopsy was obtained and results still pending. Pt lives at home, her sister and daughter takes care of her. She does not go out to see doctors due to her paraplegia.  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 after loss of  parents, is fine   Vaginal Pap smear, abnormal    f/u wnl    SURGICAL HISTORY: Past Surgical History:  Procedure Laterality Date   CERVICAL POLYPECTOMY  05/01/2012   Procedure: CERVICAL POLYPECTOMY;  Surgeon: Betsy Coder, MD;  Location: [facility];  Service: Gynecology;  Laterality: N/A;   CESAREAN SECTION  07/04/2007   CHOLECYSTECTOMY  07/06/2013   CHOLECYSTECTOMY N/A 07/06/2013   Procedure: LAPAROSCOPIC CHOLECYSTECTOMY ;  Surgeon: Gayland Curry, MD;  Location: [facility];  Service: General;  Laterality: N/A;  attempted cholangiogram   fibroidecto     fibroidectomy  07/04/2007   LUMBAR LAMINECTOMY/DECOMPRESSION MICRODISCECTOMY N/A 01/19/2019   Procedure: Thoracic Nine-Lumbar One Decompressive Laminectomy;  Surgeon: Earnie Larsson, MD;  Location: [facility];  Service: Neurosurgery;  Laterality: N/A;  posterior   MYOMECTOMY  07/04/08   OPERATIVE HYSTEROSCOPY  05/01/2012   no menses since that time   TUBAL LIGATION  07/04/2007    SOCIAL HISTORY: Social History   Socioeconomic History   Marital status: Single    Spouse name: Not on file   Number of children: Not on file   Years of education: Not on file   Highest education level: Not on file  Occupational History   Not on file  Tobacco Use   Smoking status: Former    Packs/day: 0.30    Years: 1.00    Total pack years: 0.30    Types: Cigarettes  Quit date: 03/18/1999    Years since quitting: 23.6   Smokeless tobacco: Never  Vaping Use   Vaping Use: Never used  Substance and Sexual Activity   Alcohol use: No   Drug use: No   Sexual activity: Not Currently    Birth control/protection: Surgical  Other Topics Concern   Not on file  Social History Narrative   Not on file   Social Determinants of Health   Financial Resource Strain: Not on file  Food Insecurity: No Food Insecurity (11/15/2022)   Hunger Vital Sign    Worried About Running Out of Food in the Last Year: Never true    Ran Out of Food in the Last Year: Never true   Transportation Needs: No Transportation Needs (11/14/2022)   PRAPARE - Hydrologist (Medical): No    Lack of Transportation (Non-Medical): No  Physical Activity: Not on file  Stress: Not on file  Social Connections: Not on file  Intimate Partner Violence: Not At Risk (11/14/2022)   Humiliation, Afraid, Rape, and Kick questionnaire    Fear of Current or Ex-Partner: No    Emotionally Abused: No    Physically Abused: No    Sexually Abused: No    FAMILY HISTORY: Family History  Problem Relation Age of Onset   Hypertension Mother    Cancer Mother        Breast   Breast cancer Mother    Hypertension Father    Cancer Father        Prostate   Heart attack Father    Hypertension Sister    Diabetes Maternal Aunt     ALLERGIES:  is allergic to bentyl [dicyclomine].  MEDICATIONS:  Current Facility-Administered Medications  Medication Dose Route Frequency Provider Last Rate Last Admin   acetaminophen (TYLENOL) tablet 650 mg  650 mg Oral Q6H PRN Danton Clap H, DO       Or   acetaminophen (TYLENOL) suppository 650 mg  650 mg Rectal Q6H PRN Loney Laurence, DO       bisacodyl (DULCOLAX) suppository 10 mg  10 mg Rectal Daily PRN Loney Laurence, DO       bisacodyl (DULCOLAX) suppository 10 mg  10 mg Rectal Daily Danton Clap H, DO   10 mg at 11/18/22 8099   calamine lotion 1 Application  1 Application Topical PRN Loney Laurence, DO   1 Application at 83/38/25 1259   Chlorhexidine Gluconate Cloth 2 % PADS 6 each  6 each Topical Daily Danton Clap H, DO   6 each at 11/20/22 1204   diphenhydrAMINE (BENADRYL) injection 12.5-25 mg  12.5-25 mg Intravenous Q6H PRN Sheikh, Omair Latif, DO       docusate sodium (COLACE) capsule 100 mg  100 mg Oral BID Danton Clap H, DO   100 mg at 11/20/22 0915   famotidine (PEPCID) IVPB 20 mg premix  20 mg Intravenous Q12H Loney Laurence, DO 100 mL/hr at 11/20/22 1203 20 mg at 11/20/22 1203   feeding  supplement (BOOST / RESOURCE BREEZE) liquid 1 Container  1 Container Oral TID BM Loney Laurence, DO   1 Container at 11/16/22 2300   [START ON 05/39/7673] folic acid injection 1 mg  1 mg Intravenous Daily Sheikh, Omair Latif, DO       gabapentin (NEURONTIN) capsule 600 mg  600 mg Oral TID Danton Clap H, DO   600 mg at 11/20/22 1700   HYDROcodone-acetaminophen (NORCO/VICODIN) 5-325 MG per tablet  1-2 tablet  1-2 tablet Oral Q4H PRN Loney Laurence, DO   2 tablet at 11/19/22 2219   hydrocortisone cream 1 %   Topical BID Sheikh, Omair Latif, DO       hydrOXYzine (ATARAX) tablet 25-50 mg  25-50 mg Oral TID PRN Alfredia Ferguson, Omair Latif, DO       lidocaine-EPINEPHrine (XYLOCAINE W/EPI) 2 %-1:200000 (PF) injection 10 mL  10 mL Intradermal Once Danton Clap H, DO       lip balm (CARMEX) ointment   Topical PRN Loney Laurence, DO       loratadine (CLARITIN) tablet 10 mg  10 mg Oral Daily Danton Clap H, DO   10 mg at 11/20/22 1610   nystatin (MYCOSTATIN/NYSTOP) topical powder   Topical q12n4p Loney Laurence, DO   Given at 11/20/22 1204   ondansetron (ZOFRAN) tablet 4 mg  4 mg Oral Q6H PRN Loney Laurence, DO       Or   ondansetron (ZOFRAN) injection 4 mg  4 mg Intravenous Q6H PRN Loney Laurence, DO       Oral care mouth rinse  15 mL Mouth Rinse PRN Loney Laurence, DO       oxyCODONE-acetaminophen (PERCOCET/ROXICET) 5-325 MG per tablet 1 tablet  1 tablet Oral Q8H PRN Loney Laurence, DO   1 tablet at 11/18/22 2133   polyethylene glycol (MIRALAX / GLYCOLAX) packet 17 g  17 g Oral Daily PRN Loney Laurence, DO       senna (SENOKOT) tablet 8.6 mg  1 tablet Oral BID Danton Clap H, DO   8.6 mg at 11/20/22 0915   sodium chloride flush (NS) 0.9 % injection 10-40 mL  10-40 mL Intracatheter PRN Danton Clap H, DO   20 mL at 11/18/22 1608   traZODone (DESYREL) tablet 50 mg  50 mg Oral QHS PRN Loney Laurence, DO   50 mg at 11/19/22 2223    REVIEW OF SYSTEMS:    Constitutional: Denies fevers, chills or abnormal night sweats Eyes: Denies blurriness of vision, double vision or watery eyes Ears, nose, mouth, throat, and face: Denies mucositis or sore throat Respiratory: Denies cough, dyspnea or wheezes Cardiovascular: Denies palpitation, chest discomfort or lower extremity swelling Gastrointestinal:  Denies nausea, heartburn or change in bowel habits Skin: Denies abnormal skin rashes Lymphatics: Denies new lymphadenopathy or easy bruising Neurological:Denies numbness, tingling or new weaknesses Behavioral/Psych: Mood is stable, no new changes  All other systems were reviewed with the patient and are negative.  PHYSICAL EXAMINATION: ECOG PERFORMANCE STATUS: 3 - Symptomatic, >50% confined to bed  Vitals:   11/19/22 2101 11/20/22 0502  BP: (!) 119/48 (!) 109/53  Pulse: (!) 110 (!) 108  Resp: 18 20  Temp: 99.1 F (37.3 C) 98.1 F (36.7 C)  SpO2: 100% 99%   Filed Weights   11/14/22 1346  Weight: (!) 360 lb (163.3 kg)    GENERAL:alert, no distress and comfortable, morbidly obese woman  EYES: normal, conjunctiva are pink and non-injected, sclera clear OROPHARYNX:no exudate, no erythema and lips, buccal mucosa, and tongue normal  NECK: supple, thyroid normal size, non-tender, without nodularity LYMPH:  no palpable lymphadenopathy in the cervical, axillary or inguinal LUNGS: clear to auscultation and percussion with normal breathing effort HEART: regular rate & rhythm and no murmurs and no lower extremity edema ABDOMEN:abdomen soft, non-tender and normal bowel sounds Musculoskeletal:no cyanosis of digits and no clubbing  PSYCH: alert & oriented x 3 with fluent speech NEURO: no  focal motor/sensory deficits  LABORATORY DATA:  I have reviewed the data as listed Lab Results  Component Value Date   WBC 14.3 (H) 11/20/2022   HGB 9.1 (L) 11/20/2022   HCT 31.2 (L) 11/20/2022   MCV 68.7 (L) 11/20/2022   PLT 149 (L) 11/20/2022   Recent Labs     10/02/22 2133 11/14/22 1456 11/16/22 1107 11/17/22 0305 11/18/22 0250 11/19/22 0343 11/20/22 0407  NA 139 141 139   < > 138 134* 139  K 3.1* 3.3* 3.6   < > 3.1* 2.8* 3.5  CL 107 110 105   < > 107 104 110  CO2 '25 25 23   '$ < > '22 23 23  '$ GLUCOSE 101* 148* 88   < > 114* 110* 92  BUN 13 25* 15   < > '14 11 10  '$ CREATININE 0.78 0.78 0.76   < > 0.62 0.73 0.61  CALCIUM 8.4* 8.3* 8.3*   < > 8.2* 7.8* 7.8*  GFRNONAA >60 >60 >60   < > >60 >60 >60  PROT 7.5 6.6 6.5  --   --   --   --   ALBUMIN 3.4* 3.2* 3.2*  --   --   --   --   AST '20 15 16  '$ --   --   --   --   ALT '16 15 15  '$ --   --   --   --   ALKPHOS 65 60 57  --   --   --   --   BILITOT 0.4 0.4 0.9  --   --   --   --    < > = values in this interval not displayed.    RADIOGRAPHIC STUDIES: I have personally reviewed the radiological images as listed and agreed with the findings in the report. CT CHEST WO CONTRAST  Result Date: 11/20/2022 CLINICAL DATA:  Colon cancer staging.  * Tracking Code: BO * EXAM: CT CHEST WITHOUT CONTRAST TECHNIQUE: Multidetector CT imaging of the chest was performed following the standard protocol without IV contrast. RADIATION DOSE REDUCTION: This exam was performed according to the departmental dose-optimization program which includes automated exposure control, adjustment of the mA and/or kV according to patient size and/or use of iterative reconstruction technique. COMPARISON:  CT abdomen/pelvis 11/15/2022 FINDINGS: Cardiovascular: The heart is within normal limits in size. No pericardial effusion. The aorta is normal in caliber. No atherosclerotic calcifications. The right-sided PICC line is noted in good position. Mediastinum/Nodes: No mediastinal or hilar mass or lymphadenopathy. The esophagus is unremarkable. There are enlarged bilateral axillary lymph nodes which are indeterminate. No supraclavicular adenopathy. Lungs/Pleura: No acute pulmonary process. No worrisome pulmonary lesions or pulmonary nodules to  suggest metastatic disease. Small calcified granuloma noted in the right lower lobe. No pleural effusions or pleural nodules. Upper Abdomen: No significant upper abdominal findings. No hepatic lesions are identified. Status post cholecystectomy. Musculoskeletal: Postoperative changes involving the thoracic spine with previous laminectomies and benign-appearing postoperative fluid collection in the subcutaneous fat. Abnormal appearance of the sternal manubrium. Has a somewhat permeative appearance. Could not exclude a neoplastic process. Recommend correlation any sternal pain or tenderness. MRI may be helpful for further evaluation. IMPRESSION: 1. No worrisome pulmonary lesions or pulmonary nodules to suggest metastatic disease. 2. Enlarged bilateral axillary lymph nodes are indeterminate. 3. Abnormal appearance of the sternal manubrium. Could not exclude a permeative neoplastic process. Recommend correlation any sternal pain or tenderness. MRI may be helpful for further evaluation. 4. Postoperative changes  involving the thoracic spine with previous laminectomies and benign-appearing postoperative fluid collection in the subcutaneous fat. Electronically Signed   By: Marijo Sanes M.D.   On: 11/20/2022 09:29   DG CHEST PORT 1 VIEW  Result Date: 11/17/2022 CLINICAL DATA:  Leukocytosis EXAM: PORTABLE CHEST 1 VIEW COMPARISON:  11/14/2022 FINDINGS: The lungs are clear without focal pneumonia, edema, pneumothorax or pleural effusion. Cardiopericardial silhouette is at upper limits of normal for size. The visualized bony structures of the thorax are unremarkable. Right PICC line tip overlies the mid SVC level. Telemetry leads overlie the chest. IMPRESSION: No active disease. Electronically Signed   By: Misty Stanley M.D.   On: 11/17/2022 11:10   [place] EKG SITE RITE  Result Date: 11/15/2022 If Site Rite image not attached, placement could not be confirmed due to current cardiac rhythm.  CT ABDOMEN PELVIS W  CONTRAST  Result Date: 11/15/2022 CLINICAL DATA:  Suspected bowel obstruction. Hemoccult positive stool. * Tracking Code: BO * EXAM: CT ABDOMEN AND PELVIS WITH CONTRAST TECHNIQUE: Multidetector CT imaging of the abdomen and pelvis was performed using the standard protocol following bolus administration of intravenous contrast. RADIATION DOSE REDUCTION: This exam was performed according to the departmental dose-optimization program which includes automated exposure control, adjustment of the mA and/or kV according to patient size and/or use of iterative reconstruction technique. CONTRAST:  132m OMNIPAQUE IOHEXOL 300 MG/ML  SOLN COMPARISON:  October 02, 2022. FINDINGS: Lower chest: Lung bases are clear without effusion or consolidative changes. Hepatobiliary: Limited assessment of the liver which is incompletely imaged. No gross lesion. No biliary duct dilation following cholecystectomy. Study overall is limited by body habitus. Portal vein is patent. Pancreas: Normal, without mass, inflammation or ductal dilatation. Spleen: Top normal size. Adrenals/Urinary Tract: Adrenal glands are normal. Nephrolithiasis on the LEFT with LEFT proximal ureteral calculi in the range of 4-5 mm, perhaps 2 adjacent calculi or 1 irregular calculus displaying mild LEFT proximal ureteral and renal pelvic distension without hydronephrosis is unchanged. Calculi within the kidney also unchanged. 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 No additional ureteral calculi on today's study. No perivesical stranding. Stomach/Bowel: Large volume of stool in the rectum. No substantial perirectal stranding is. Moderate stool elsewhere in the colon. Just proximal to the hepatic flexure in the ascending colon there is irregular wall thickening with infiltration of the pericolonic fat associated with small pericolonic lymph nodes. In hindsight a subtle  finding on previous imaging and persistence and appearance is highly concerning for colorectal neoplasm. Additional site of wall thickening potentially in the sigmoid colon (image 57/3) likewise potentially present and persistent. Stomach without signs of inflammation. Small bowel without signs of obstruction. Lesion in the ascending colon does not appear to obstruct. Appendix is normal. Vascular/Lymphatic: Scattered small lymph nodes throughout the retroperitoneum. No upper abdominal lymphadenopathy no gross mesenteric adenopathy though scattered small lymph nodes throughout the RIGHT colonic mesentery suspicious given other findings on the current study. No pelvic lymphadenopathy. Vascular structures in the abdomen and pelvis are patent grossly on venous phase. Reproductive: Unremarkable. Other: No ascites.  No pneumoperitoneum. Musculoskeletal: Spinal degenerative changes. No acute or destructive bone process. Signs of body wall edema. IMPRESSION: 1. Circumferential irregular thickening of the ascending colon just proximal to the hepatic flexure with adjacent small mesenteric lymph nodes is highly concerning for if not diagnostic of colonic neoplasm. 2. Second site of potential thickening in the sigmoid: Raise the question of an  additional colorectal neoplasm of the mid sigmoid colon, less likely colitis. Endoscopic correlation is advised. 3. Signs of fecal impaction. Wall thickness of the rectum raising the question of stercoral proctocolitis. No substantial adjacent stranding at this time. 4. Proximal LEFT ureteral calculus/calculi with mild fullness of the LEFT ureter, stable appearance of intrarenal calculi on the LEFT since previous imaging. 5. Signs of body wall edema 6. Limited assessment of the liver which is incompletely imaged. These results will be called to the ordering clinician or representative by the Radiologist Assistant, and communication documented in the PACS or [employer].  Electronically Signed   By: Zetta Bills M.D.   On: 11/15/2022 08:26   DG Abd 1 View  Result Date: 11/14/2022 CLINICAL DATA:  Constipation, no bowel movement for 3 days. EXAM: ABDOMEN - 1 VIEW COMPARISON:  None Available. FINDINGS: A few distended loops of small bowel are noted in the abdomen measuring up to 3.5 cm. A moderate amount of retained stool is present in the colon and rectum. No radio-opaque calculi or other acute radiographic abnormality are seen. Surgical clips are present in the right upper quadrant. IMPRESSION: 1. Multiple distended loops of small bowel in the abdomen measuring up to 3.5 cm, possible ileus versus obstruction. 2. Moderate amount of retained stool in the colon. Electronically Signed   By: Brett Fairy M.D.   On: 11/14/2022 20:41   DG Chest Portable 1 View  Result Date: 11/14/2022 CLINICAL DATA:  weakness EXAM: PORTABLE CHEST 1 VIEW COMPARISON:  Chest x-ray 05/20/2011 FINDINGS: The heart and mediastinal contours are within normal limits. Left costophrenic angle and left base/hemidiaphragm collimated off view. No focal consolidation. No pulmonary edema. No pleural effusion. No pneumothorax. No acute osseous abnormality. IMPRESSION: No active disease; however, left costophrenic angle and left base/hemidiaphragm collimated off view. Cannot exclude consolidation at the left base. Recommend repeat PA and lateral view of the chest. Electronically Signed   By: Iven Finn M.D.   On: 11/14/2022 17:07    ASSESSMENT & PLAN:  51 yo woman who presented profound anemia and iron deficiency  Colon mass in the hepatic flexure and sigmoid colon, most consistent with colon cancer. Iron deficient anemia, status post 2 unit blood transfusion Skin rash HTN Morbid obesity Paraplegia secondary to laminectomy in 2020  Recommendations: -I have reviewed her colonoscopy findings, and CT images. Her biopsy path is still pending, I spoke with path  -She likely has synchronized to colon  cancers, with possible regional nodal metastasis, no evidence of distant metastasis on CT scan -She has been evaluated by general surgeon Dr. Kieth Brightly, possible colon resection soon -I discussed the role of adjuvant chemotherapy for stage III colon cancer, and some high risk stage II disease.  I reviewed her surgical pathology results, if she does not have high risk disease, may consider oral chemo capecitabine.  Due to her paraplegia, she would not be a candidate for intravenous chemo.  -her cancer surveillance is also going to be a challenge, likely we are only able to do labs at home, will only do CT scan if she is symptomatic and requires further investigation. -She has received 1 dose Feraheme yesterday, please give 1 more dose 510 mg in a week. -I will f/u before her discharge to arrange her future f/u (likely virtual visits).  All questions were answered. The patient knows to call the clinic with any problems, questions or concerns.      Truitt Merle, MD 11/20/2022 5:41 PM

## 2022-11-20 NOTE — Plan of Care (Signed)
  Problem: Clinical Measurements: Goal: Respiratory complications will improve Outcome: Progressing   Problem: Elimination: Goal: Will not experience complications related to bowel motility Outcome: Progressing Goal: Will not experience complications related to urinary retention Outcome: Progressing

## 2022-11-20 NOTE — Progress Notes (Signed)
[facility] Progress Note  SUBJECTIVE:   Interval history: Ellen Hill was seen and evaluated today at bedside.  Denied any abdominal pain today.  Denied nausea or vomiting.  Denied chest pain or shortness of breath.  Noted that the pruritus and skin lesions on her upper extremities, anterior torso are worsening.  Colonoscopy findings 11/19/2022 included sigmoid mass lesion nonobstructing able to be traversed, hepatic flexure mass lesion unable to be traversed.  Both mass lesions were biopsied and tattoo was placed distal to mass lesions.  These findings were discussed with patient and her sister at bedside after procedure.  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 after loss of parents, is fine   Vaginal Pap smear, abnormal    f/u wnl   Past Surgical History:  Procedure Laterality Date   CERVICAL POLYPECTOMY  05/01/2012   Procedure: CERVICAL POLYPECTOMY;  Surgeon: Betsy Coder, MD;  Location: [facility];  Service: Gynecology;  Laterality: N/A;   CESAREAN SECTION  07/04/2007   CHOLECYSTECTOMY  07/06/2013   CHOLECYSTECTOMY N/A 07/06/2013   Procedure: LAPAROSCOPIC CHOLECYSTECTOMY ;  Surgeon: Gayland Curry, MD;  Location: [facility];  Service: General;  Laterality: N/A;  attempted cholangiogram   fibroidecto     fibroidectomy  07/04/2007   LUMBAR LAMINECTOMY/DECOMPRESSION MICRODISCECTOMY N/A  01/19/2019   Procedure: Thoracic Nine-Lumbar One Decompressive Laminectomy;  Surgeon: Earnie Larsson, MD;  Location: [facility];  Service: Neurosurgery;  Laterality: N/A;  posterior   MYOMECTOMY  07/04/08   OPERATIVE HYSTEROSCOPY  05/01/2012   no menses since that time   TUBAL LIGATION  07/04/2007   Current Facility-Administered Medications  Medication Dose Route Frequency Provider Last Rate Last Admin   acetaminophen (TYLENOL) tablet 650 mg  650 mg Oral Q6H PRN Danton Clap H, DO       Or   acetaminophen (TYLENOL) suppository 650 mg  650 mg Rectal Q6H PRN Loney Laurence, DO       bisacodyl (DULCOLAX) suppository 10 mg  10 mg Rectal Daily PRN Loney Laurence, DO       bisacodyl (DULCOLAX) suppository 10 mg  10 mg Rectal Daily Danton Clap H, DO   10 mg at 11/18/22 9983   calamine lotion 1 Application  1 Application Topical PRN Loney Laurence, DO   1 Application at 38/25/05 1259   Chlorhexidine Gluconate Cloth 2 % PADS 6 each  6 each Topical Daily Loney Laurence, DO   6 each at 11/17/22 1259   docusate sodium (COLACE) capsule 100 mg  100 mg Oral BID Danton Clap H, DO   100 mg at 11/20/22 0915   famotidine (PEPCID) IVPB 20 mg premix  20 mg Intravenous Q12H Loney Laurence,  DO 100 mL/hr at 11/19/22 2221 20 mg at 11/19/22 2221   feeding supplement (BOOST / RESOURCE BREEZE) liquid 1 Container  1 Container Oral TID BM Danton Clap H, DO   1 Container at 11/16/22 2300   [START ON 12/87/8676] folic acid injection 1 mg  1 mg Intravenous Daily Sheikh, Omair Latif, DO       gabapentin (NEURONTIN) capsule 600 mg  600 mg Oral TID Danton Clap H, DO   600 mg at 11/20/22 0915   HYDROcodone-acetaminophen (NORCO/VICODIN) 5-325 MG per tablet 1-2 tablet  1-2 tablet Oral Q4H PRN Loney Laurence, DO   2 tablet at 11/19/22 2219   hydrOXYzine (ATARAX) tablet 25 mg  25 mg Oral TID PRN Danton Clap H, DO   25 mg at 11/20/22 0914   lidocaine-EPINEPHrine (XYLOCAINE W/EPI) 2 %-1:200000  (PF) injection 10 mL  10 mL Intradermal Once Danton Clap H, DO       lip balm (CARMEX) ointment   Topical PRN Loney Laurence, DO       loratadine (CLARITIN) tablet 10 mg  10 mg Oral Daily Danton Clap H, DO   10 mg at 11/20/22 7209   nystatin (MYCOSTATIN/NYSTOP) topical powder   Topical q12n4p Loney Laurence, DO   Given at 11/19/22 1520   ondansetron (ZOFRAN) tablet 4 mg  4 mg Oral Q6H PRN Loney Laurence, DO       Or   ondansetron (ZOFRAN) injection 4 mg  4 mg Intravenous Q6H PRN Loney Laurence, DO       Oral care mouth rinse  15 mL Mouth Rinse PRN Loney Laurence, DO       oxyCODONE-acetaminophen (PERCOCET/ROXICET) 5-325 MG per tablet 1 tablet  1 tablet Oral Q8H PRN Danton Clap H, DO   1 tablet at 11/18/22 2133   polyethylene glycol (MIRALAX / GLYCOLAX) packet 17 g  17 g Oral Daily PRN Loney Laurence, DO       senna (SENOKOT) tablet 8.6 mg  1 tablet Oral BID Danton Clap H, DO   8.6 mg at 11/20/22 0915   sodium chloride flush (NS) 0.9 % injection 10-40 mL  10-40 mL Intracatheter PRN Danton Clap H, DO   20 mL at 11/18/22 1608   traZODone (DESYREL) tablet 50 mg  50 mg Oral QHS PRN Danton Clap H, DO   50 mg at 11/19/22 2223   Allergies as of 11/14/2022 - Review Complete 11/14/2022  Allergen Reaction Noted   Bentyl [dicyclomine] Rash 11/14/2022   Review of Systems:  Review of Systems  Respiratory:  Negative for shortness of breath.   Cardiovascular:  Negative for chest pain.  Gastrointestinal:  Negative for abdominal pain, nausea and vomiting.  Skin:  Positive for rash.    OBJECTIVE:   Temp:  [97.4 F (36.3 C)-99.1 F (37.3 C)] 98.1 F (36.7 C) (11/29 0502) Pulse Rate:  [89-110] 108 (11/29 0502) Resp:  [16-23] 20 (11/29 0502) BP: (95-139)/(42-75) 109/53 (11/29 0502) SpO2:  [97 %-100 %] 99 % (11/29 0502) Last BM Date : 11/19/22 Physical Exam Constitutional:      Appearance: She is not diaphoretic.  Cardiovascular:     Rate and  Rhythm: Normal rate and regular rhythm.  Pulmonary:     Effort: No respiratory distress.     Breath sounds: Normal breath sounds.  Abdominal:     General: Bowel sounds are normal. There is no distension.     Palpations: Abdomen is soft.  Tenderness: There is abdominal tenderness. There is no guarding.  Skin:    Findings: Rash (Bilateral upper extremities and anterior chest wall with small vesicular appearing lesions) present.  Neurological:     Mental Status: She is alert.     Labs: Recent Labs    11/18/22 0250 11/19/22 0343 11/20/22 0407  WBC 18.5* 18.0* 14.3*  HGB 10.3* 9.2* 9.1*  HCT 35.8* 31.5* 31.2*  PLT 203 158 149*   BMET Recent Labs    11/18/22 0250 11/19/22 0343 11/20/22 0407  NA 138 134* 139  K 3.1* 2.8* 3.5  CL 107 104 110  CO2 '22 23 23  '$ GLUCOSE 114* 110* 92  BUN '14 11 10  '$ CREATININE 0.62 0.73 0.61  CALCIUM 8.2* 7.8* 7.8*   LFT No results for input(s): "PROT", "ALBUMIN", "AST", "ALT", "ALKPHOS", "BILITOT", "BILIDIR", "IBILI" in the last 72 hours. PT/INR No results for input(s): "LABPROT", "INR" in the last 72 hours. Diagnostic imaging: No results found.  IMPRESSION: Mass lesions of sigmoid colon and hepatic flexure  -Colonoscopy completed on 11/19/2022, biopsies from mass lesions are pending   -Recommended involvement of surgical services and oncology services Abnormal CT imaging Microcytic anemia Leukocytosis Unspecified vesicular rash of upper extremities and anterior torso Paraplegia  PLAN: -Follow-up results of CT chest -Follow-up results of pathology -Follow-up recommendations from surgery -Follow-up recommendations from oncology -Okay for diet as tolerated from GI standpoint -GI will follow case peripherally for pathology, available for any questions as needed   LOS: 6 days   Danton Clap, [facility]

## 2022-11-21 ENCOUNTER — Other Ambulatory Visit: Payer: Self-pay

## 2022-11-21 DIAGNOSIS — R21 Rash and other nonspecific skin eruption: Secondary | ICD-10-CM | POA: Diagnosis not present

## 2022-11-21 DIAGNOSIS — E876 Hypokalemia: Secondary | ICD-10-CM | POA: Diagnosis not present

## 2022-11-21 DIAGNOSIS — D72823 Leukemoid reaction: Secondary | ICD-10-CM | POA: Diagnosis not present

## 2022-11-21 LAB — COMPREHENSIVE METABOLIC PANEL
ALT: 11 U/L (ref 0–44)
AST: 11 U/L — ABNORMAL LOW (ref 15–41)
Albumin: 2.8 g/dL — ABNORMAL LOW (ref 3.5–5.0)
Alkaline Phosphatase: 52 U/L (ref 38–126)
Anion gap: 7 (ref 5–15)
BUN: 10 mg/dL (ref 6–20)
CO2: 23 mmol/L (ref 22–32)
Calcium: 8.1 mg/dL — ABNORMAL LOW (ref 8.9–10.3)
Chloride: 112 mmol/L — ABNORMAL HIGH (ref 98–111)
Creatinine, Ser: 0.72 mg/dL (ref 0.44–1.00)
GFR, Estimated: >60 mL/min (ref >60)
Glucose, Bld: 160 mg/dL — ABNORMAL HIGH (ref 70–99)
Potassium: 4 mmol/L (ref 3.5–5.1)
Sodium: 142 mmol/L (ref 135–145)
Total Bilirubin: 0.6 mg/dL (ref 0.3–1.2)
Total Protein: 6 g/dL — ABNORMAL LOW (ref 6.5–8.1)

## 2022-11-21 LAB — CBC WITH DIFFERENTIAL/PLATELET
Abs Immature Granulocytes: 0.14 10*3/uL — ABNORMAL HIGH (ref 0.00–0.07)
Basophils Absolute: 0 10*3/uL (ref 0.0–0.1)
Basophils Relative: 0 %
Eosinophils Absolute: 0 10*3/uL (ref 0.0–0.5)
Eosinophils Relative: 0 %
HCT: 29.7 % — ABNORMAL LOW (ref 36.0–46.0)
Hemoglobin: 8.5 g/dL — ABNORMAL LOW (ref 12.0–15.0)
Immature Granulocytes: 1 %
Lymphocytes Relative: 11 %
Lymphs Abs: 1.1 10*3/uL (ref 0.7–4.0)
MCH: 20 pg — ABNORMAL LOW (ref 26.0–34.0)
MCHC: 28.6 g/dL — ABNORMAL LOW (ref 30.0–36.0)
MCV: 70 fL — ABNORMAL LOW (ref 80.0–100.0)
Monocytes Absolute: 0.1 10*3/uL (ref 0.1–1.0)
Monocytes Relative: 1 %
Neutro Abs: 9 10*3/uL — ABNORMAL HIGH (ref 1.7–7.7)
Neutrophils Relative %: 87 %
Platelets: 142 10*3/uL — ABNORMAL LOW (ref 150–400)
RBC: 4.24 MIL/uL (ref 3.87–5.11)
RDW: 31.2 % — ABNORMAL HIGH (ref 11.5–15.5)
WBC: 10.4 10*3/uL (ref 4.0–10.5)
nRBC: 0 % (ref 0.0–0.2)

## 2022-11-21 LAB — MAGNESIUM: Magnesium: 2.2 mg/dL (ref 1.7–2.4)

## 2022-11-21 LAB — PHOSPHORUS: Phosphorus: 2.1 mg/dL — ABNORMAL LOW (ref 2.5–4.6)

## 2022-11-21 MED ORDER — NEOMYCIN SULFATE 500 MG PO TABS
1000.0000 mg | ORAL_TABLET | Freq: Once | ORAL | Status: AC
  Administered 2022-11-21: 1000 mg via ORAL
  Filled 2022-11-21: qty 2

## 2022-11-21 MED ORDER — METRONIDAZOLE 500 MG PO TABS
1000.0000 mg | ORAL_TABLET | Freq: Once | ORAL | Status: AC
  Administered 2022-11-21: 1000 mg via ORAL
  Filled 2022-11-21: qty 2

## 2022-11-21 MED ORDER — ENSURE PRE-SURGERY PO LIQD
296.0000 mL | Freq: Once | ORAL | Status: DC
  Filled 2022-11-21: qty 296

## 2022-11-21 MED ORDER — METHYLPREDNISOLONE SODIUM SUCC 125 MG IJ SOLR
125.0000 mg | Freq: Once | INTRAMUSCULAR | Status: AC
  Administered 2022-11-21: 125 mg via INTRAVENOUS
  Filled 2022-11-21: qty 2

## 2022-11-21 MED ORDER — POTASSIUM CHLORIDE 10 MEQ/100ML IV SOLN: 10.0000 meq | INTRAVENOUS | Status: DC

## 2022-11-21 MED ORDER — METRONIDAZOLE 500 MG PO TABS: 1000.0000 mg | ORAL_TABLET | ORAL | Status: DC

## 2022-11-21 MED ORDER — ALTEPLASE 2 MG IJ SOLR
2.0000 mg | Freq: Once | INTRAMUSCULAR | Status: DC
  Filled 2022-11-21: qty 2

## 2022-11-21 MED ORDER — SODIUM CHLORIDE 0.9 % IV SOLN
2.0000 g | INTRAVENOUS | Status: AC
  Administered 2022-11-22: 2 g via INTRAVENOUS
  Filled 2022-11-21: qty 2

## 2022-11-21 MED ORDER — ENOXAPARIN SODIUM 40 MG/0.4ML IJ SOSY
40.0000 mg | PREFILLED_SYRINGE | Freq: Once | INTRAMUSCULAR | Status: DC
  Filled 2022-11-21: qty 0.4

## 2022-11-21 MED ORDER — ENSURE MAX PROTEIN PO LIQD
11.0000 [oz_av] | Freq: Two times a day (BID) | ORAL | Status: DC
  Administered 2022-11-23 – 2022-11-24 (×2): 11 [oz_av] via ORAL
  Filled 2022-11-21 (×12): qty 330

## 2022-11-21 MED ORDER — POTASSIUM PHOSPHATES 15 MMOLE/5ML IV SOLN
15.0000 mmol | Freq: Once | INTRAVENOUS | Status: AC
  Administered 2022-11-21: 15 mmol via INTRAVENOUS
  Filled 2022-11-21: qty 5

## 2022-11-21 MED ORDER — ENSURE PRE-SURGERY PO LIQD
592.0000 mL | Freq: Once | ORAL | Status: AC
  Administered 2022-11-21: 592 mL via ORAL
  Filled 2022-11-21 (×2): qty 592

## 2022-11-21 MED ORDER — NEOMYCIN SULFATE 500 MG PO TABS
1000.0000 mg | ORAL_TABLET | ORAL | Status: DC
  Filled 2022-11-21: qty 2

## 2022-11-21 NOTE — TOC Initial Note (Signed)
Transition of Care [facility]) - Initial/Assessment Note    Patient Details  Name: Ellen Hill MRN: [id number] Date of Birth: Oct 07, 1971  Transition of Care [facility]) CM/SW Contact:    Dessa Phi, RN Phone Number: 11/21/2022, 4:02 PM  Clinical Narrative:  Paraplegic;Active w/[facility] HHPT/OT;has dme. PT recc HHPT;patient requesting CIR-MD updated-await d/c plan.PTAR needed @ d/c.                 Expected Discharge Plan: [place] Barriers to Discharge: Continued Medical Work up   Patient Goals and CMS Choice Patient states their goals for this hospitalization and ongoing recovery are::  (Home) CMS Medicare.gov Compare Post Acute Care list provided to:: Patient Choice offered to / list presented to : Patient  Expected Discharge Plan and Services Expected Discharge Plan: [place]   Discharge Planning Services: CM Consult Post Acute Care Choice: [place] arrangements for the past 2 months: [place]                                      Prior Living Arrangements/Services Living arrangements for the past 2 months: Single Family Home Lives with:: Relatives   Do you feel safe going back to the place where you live?: Yes          Current home services: Home PT, Home OT, DME    Activities of Daily Living Home Assistive Devices/Equipment: Other (Comment) ADL Screening (condition at time of admission) Patient's cognitive ability adequate to safely complete daily activities?: Yes Is the patient deaf or have difficulty hearing?: No Does the patient have difficulty seeing, even when wearing glasses/contacts?: No Does the patient have difficulty concentrating, remembering, or making decisions?: No Patient able to express need for assistance with ADLs?: Yes Does the patient have difficulty dressing or bathing?: No Independently performs ADLs?: No Does the patient have difficulty walking or climbing stairs?:  No Weakness of Legs: Both Weakness of Arms/Hands: None  Permission Sought/Granted Permission sought to share information with : Case Manager Permission granted to share information with : Yes, Verbal Permission Granted              Emotional Assessment              Admission diagnosis:  Cellulitis [L03.90] Symptomatic anemia [D64.9] Cellulitis, unspecified cellulitis site [L03.90] Patient Active Problem List   Diagnosis Date Noted   Colonic mass 11/20/2022   Ileus ([place]) 11/15/2022   Erythema multiforme 11/15/2022   Symptomatic anemia 11/15/2022   Cellulitis 11/14/2022   Hypokalemia 11/14/2022   Bullous rash 11/14/2022   Constipation 11/14/2022   Insomnia 10/31/2020   Essential hypertension 10/25/2020   Dysuria    Super obesity    Anemia of chronic disease    Leukocytosis    Neurogenic bowel    Neurogenic bladder    E. coli UTI    Myelopathy ([place]) 01/29/2019   Thoracic myelopathy 01/17/2019   Lumbar back pain with radiculopathy affecting lower extremity 12/08/2018   Dysmenorrhea 04/13/2012   Class 3 severe obesity due to excess calories without serious comorbidity with body mass index (BMI) of 60.0 to 69.9 in adult [facility]) 04/13/2012   Anemia 04/13/2012   Fibroids 04/13/2012   Menorrhagia 03/07/2012   PCP:  [facility] Pharmacy:   [id number]- [place], [place] - [id number]  [place] [address] Phone: [phone] Fax: [phone]     Social Determinants of Health (SDOH) Interventions    Readmission Risk Interventions     No data to display

## 2022-11-21 NOTE — Progress Notes (Signed)
PROGRESS NOTE    [facility]  SVX:[id number] DOB: 1971-10-02 DOA: 11/14/2022 PCP: [facility]   Brief Narrative:  The Patient is a 51 year old female history of paraplegia as a result of laminectomy, history of chronic anemia, morbid obesity, hypertension, constipation presented with a rash ongoing x 2 weeks which had developed on her anterior trunk and extremities and abdomen started on doxycycline and treated with steroids however no significant improvement.  Patient noted not to have any mucosal involvement or rash of the palms of the hands and the feet no involvement of the posterior body.  Patient also noted to have a history of drug-induced constipation.  Patient noted on 10/30/2022 to be started on Bentyl however reported rash started on her legs progressed up to 2 days after starting Bentyl and using steroids only made rash worse and spread with a burning and itching sensation.  Patient noted to be followed by GI for chronic anemia and was to undergo endoscopy in the OR unfortunately developed rash and unable to get this done.  Patient presented to the ED, noted to have a significant anemia with a hemoglobin of 6.8 and admitted.  Patient transfused 2 units packed red blood cells.  Posttransfusion CBC pending.  CT abdomen and pelvis done due to concern for ileus with circumferential irregular thickening of the ascending colon proximal to the hepatic flexure, small mesenteric lymph nodes findings concerning for colonic neoplasm, second potential site of thickening in the sigmoid also raising concern for colorectal neoplasm, significant fecal impaction, left ureteral calculus/calculi with mild fullness of the left ureter stable appearance of intra renal calculi on the left since previous imaging, signs of body wall edema.  GI consulted for further evaluation.  Patient for colonoscopy 11/19/2022.  ID also consulted for evaluation of rash.    Colonoscopy done after CT scan of the  abdomen pelvis shows circumferential irregular thickening of the ascending colon proximal to the hepatic flexure as well as small mesenteric lymph nodes and a second potential site of thickening in the sigmoid colon.  Colonoscopy was done which showed 2 masses at the hepatic flexure and sigmoid colon biopsy was obtained and results are still pending.  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  Oncology has discussed the role of adjuvant chemotherapy for stage III colon cancer and some high risk stage II disease and she does not have high risk disease she may be a oral chemo candidate with capecitabine due to her paraplegia she would not be a candidate for intravenous chemo.   Assessment and Plan:  Diffuse Rash -Patient noted to have developed a rash for the past 2 weeks which have worsened, rash with bullous changes and per ID concern for erythema multiforme with no severe sloughing or Nikolsky sign noted. -Patient with no mucosal involvement and no prior HSV infection. -HIV negative.  RPR nonreactive.  Respiratory viral PCR negative. -Mycoplasma IgM <770.  -ID recommending general surgery evaluation for punch biopsy and will be done  -ID feels could be possibly related to Bentyl. -Also concern for possible paraneoplastic rash. -ID recommending calamine lotion for itching and will need outpatient follow-up with dermatology. -Given her severity of her rash we will try Solu-Medrol 125 mg x 1 dose and then add Benadryl 12.5 to 25 mg IV every 6 as needed as well as increase her Atarax dose to  25-50 mg TIDprn; she had some improvement with the Solu-Medrol so we will repeat the dose again today -Will also add hydrocortisone cream and await surgical punch biopsy when she goes for Surgical Resection for likley total abdominal colectomy with End ileostomy     Severe iron deficiency anemia likely in the setting of colonic masses in the hepatic flexure and sigmoid colon -Patient with no overt bleeding. -FOBT negative. -Hemoglobin noted at 6.8 on admission (11/14/2022). -Status posttransfusion 2 units packed red blood cells, hemoglobin this morning at 9.2 this morning. -Anemia panel with iron level of 12, ferritin of 3, folate of 4.6. -CT abdomen and pelvis with circumferential irregular thickening of the ascending colon proximal to the hepatic flexure with adjacent small mesenteric lymph nodes concerning for colonic neoplasm, potential second site in the sigmoid also raising concern for additional colorectal neoplasm of the mid sigmoid colon less likely colitis.  Fecal impaction also noted. -GI consulted for further evaluation and patient started on a bowel prep for colonoscopy.  Status post 2 to 3 days bowel prep which has been completed and patient underwent colonoscopy which showed an ulcerated nonobstructing medium-sized mass found in the rectosigmoid colon where the mass was noncircumferential and oozing was present.  This was biopsied with a cold forceps for histology.  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 Enlarged bilateral axillary lymph nodes are indeterminate. Abnormal appearance of the sternal manubrium. Could not exclude a permeative neoplastic process. Recommend correlation any sternal pain or tenderness. MRI may be helpful for further  evaluation. Postoperative changes involving the thoracic spine with previouslaminectomies and benign-appearing postoperative fluid collection in the subcutaneous fat." -Follow H&H, transfusion threshold hemoglobin < 7. 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  Oncology has discussed the role of adjuvant chemotherapy for stage III colon cancer and some high risk stage II disease and she does not have high risk disease she may be a oral chemo candidate with capecitabine due to her paraplegia she would not be a candidate for intravenous chemo.   Hypokalemia -Secondary to GI losses.   -Patient status post bowel prep for the past 2 to 3 days.   -Potassium at 2.8 yesterday and improved to 3.5 yesterday and is now 4.0 -Magnesium at 2.2 -Continue potassium repletion and magnesium monitoring -Repeat CMP in the a.m.  Hypophosphatemia -Patient's Phos Level is now 2.1 -Replete with IV K Phos 15 mmol -Continue to Monitor and Replete as Necessary -Repeat CMP in the AM    Constipation -Likely drug-induced. -Disimpaction ordered initially on admission.. -Patient was placed on Dulcolax suppository and Senokot as also received a smog enema.   -Status post bowel prep for in anticipation of colonoscopy .  -Post colonoscopy will likely need to be placed on a bowel regimen. -GI following    Hypertension -BP currently controlled off antihypertensive medications. -Continue to Monitor BP per Protocol -Last BP reading  was 151/80   Super Morbid Obesity -Complicates overall prognosis and care -Estimated body mass index is 58.11 kg/m as calculated from the following:   Height as of this encounter: '5\' 6"'$  (1.676 m).   Weight as of this encounter: 163.3 kg.  -Weight Loss and Dietary Counseling given and will need continual Lifestyle  modification -Outpatient follow-up with PCP.   Leukocytosis -With a worsening leukocytosis initially but trending back down.  As WBC has gone from 20.8 -> 25.8 -> 18.5 -> 18.0 -> 14.3 -> 10.4 -Currently afebrile. -Repeat UA with trace leukocytes, negative nitrites, 11-20 WBCs.  Urine cultures with 10,000 colonies of Enterococcus faecalis.  Blood cultures pending with no growth to date.  Respiratory viral panel negative. -Chest x-ray with no acute abnormalities.. -Patient nontoxic-appearing. -Monitor off antibiotics at this time and repeat labs in the AM.   Thrombocytopenia -The patient's platelet count dropped from 358 -> 336 -> 203 -> 158 -> 149 -> 142 -Continue to monitor for signs and symptoms of bleeding; no overt bleeding noted -Repeat CBC in a.m.   Paraplegia -As a result of a laminectomy -Continue supportive care  DVT prophylaxis: enoxaparin (LOVENOX) injection 40 mg Start: 11/21/22 1600 SCD's Start: 11/21/22 1428 SCDs Start: 11/14/22 2005    Code Status: Full Code Family Communication: No family present at bedside   Disposition Plan:  Level of care: Progressive Status is: Inpatient Remains inpatient appropriate because: She continues to have a rash and is improving but will need further surgical clearance as well as oncology clearance prior to safe discharge disposition and PT OT recommending home health will need to be reevaluated when and if she goes to surgery   Consultants:  Gastroenterology General Surgery Medical Oncology   Procedures:  As Delineated as above  Antimicrobials:  Anti-infectives (From admission, onward)    Start     Dose/Rate Route Frequency Ordered Stop   11/22/22 0600  cefoTEtan (CEFOTAN) 2 g in sodium chloride 0.9 % 100 mL IVPB        2 g 200 mL/hr over 30 Minutes Intravenous On call to O.R. 11/21/22 1427 11/23/22 0559   11/21/22 2200  neomycin (MYCIFRADIN) tablet 1,000 mg       See Hyperspace for full Linked Orders Report.   1,000 mg  Oral 3 times per day 11/21/22 1427 11/22/22 2159   11/21/22 2200  metroNIDAZOLE (FLAGYL) tablet 1,000 mg       See Hyperspace for full Linked Orders Report.   1,000 mg Oral 3 times per day 11/21/22 1427 11/22/22 2159   11/14/22 1545  cefTRIAXone (ROCEPHIN) 1 g in sodium chloride 0.9 % 100 mL IVPB        1 g 200 mL/hr over 30 Minutes Intravenous  Once 11/14/22 1539 11/15/22 0700       Subjective: Seen and examined at bedside and states that she is doing much better after the Solu-Medrol states that she is not as itchy and thinks the rash is improving now.  Denies any chest pain or shortness of breath.  Feels okay and improved with symptom relief.  Objective: Vitals:   11/20/22 2000 11/21/22 0327 11/21/22 0744 11/21/22 1147  BP: 116/73 124/81 126/78 (!) 151/80  Pulse: (!) 102 74 71 79  Resp: '20  20 20  '$ Temp: 99.1 F (37.3 C) (!) 97.3 F (36.3 C) 97.6 F (36.4 C) 97.6 F (36.4 C)  TempSrc: Oral Oral Oral Oral  SpO2:  100% 100% 92%  Weight:      Height:  Intake/Output Summary (Last 24 hours) at 11/21/2022 1600 Last data filed at 11/21/2022 1400 Gross per 24 hour  Intake 654.25 ml  Output 280 ml  Net 374.25 ml   Filed Weights   11/14/22 1346  Weight: (!) 163.3 kg   Examination: Physical Exam:  Constitutional: WN/WD super morbidly obese paraplegic African-American female currently no acute distress Respiratory: Diminished to auscultation bilaterally, no wheezing, rales, rhonchi or crackles. Normal respiratory effort and patient is not tachypenic. No accessory muscle use.  Unlabored breathing Cardiovascular: RRR, no murmurs / rubs / gallops. S1 and S2 auscultated.  Has mild 1+ lower extremity edema Abdomen: Soft, non-tender, distended secondary to body habitus. Bowel sounds positive.  GU: Deferred. Musculoskeletal: No clubbing / cyanosis of digits/nails. No joint deformity upper and lower extremities.  Skin: Has a diffuse rash noted on her skin with some bullae and  some vesicles that are broken with an lesions where she scratched her upper arms and chest area as well as her legs.  Rash is improving Neurologic: CN 2-12 grossly intact with no focal deficits. Romberg sign and cerebellar reflexes not assessed.  Psychiatric: Normal judgment and insight. Alert and oriented x 3. Normal mood and appropriate affect.   Data Reviewed: I have personally reviewed following labs and imaging studies  CBC: Recent Labs  Lab 11/17/22 0305 11/18/22 0250 11/19/22 0343 11/20/22 0407 11/21/22 0329  WBC 25.8* 18.5* 18.0* 14.3* 10.4  NEUTROABS 18.2* 12.6* 12.0* 9.7* 9.0*  HGB 11.1* 10.3* 9.2* 9.1* 8.5*  HCT 38.5 35.8* 31.5* 31.2* 29.7*  MCV 68.1* 67.8* 68.2* 68.7* 70.0*  PLT 336 203 158 149* 381*   Basic Metabolic Panel: Recent Labs  Lab 11/14/22 1939 11/16/22 1107 11/17/22 0305 11/18/22 0250 11/19/22 0343 11/20/22 0407 11/21/22 0329  NA  --    < > 145 138 134* 139 142  K  --    < > 3.6 3.1* 2.8* 3.5 4.0  CL  --    < > 110 107 104 110 112*  CO2  --    < > 18* '22 23 23 23  '$ GLUCOSE  --    < > 128* 114* 110* 92 160*  BUN  --    < > '15 14 11 10 10  '$ CREATININE  --    < > 0.79 0.62 0.73 0.61 0.72  CALCIUM  --    < > 8.4* 8.2* 7.8* 7.8* 8.1*  MG 2.1   < > 2.2 2.2 2.1 2.2 2.2  PHOS 2.8  --   --   --   --   --  2.1*   < > = values in this interval not displayed.   GFR: Estimated Creatinine Clearance: 135.5 mL/min (by C-G formula based on SCr of 0.72 mg/dL). Liver Function Tests: Recent Labs  Lab 11/16/22 1107 11/21/22 0329  AST 16 11*  ALT 15 11  ALKPHOS 57 52  BILITOT 0.9 0.6  PROT 6.5 6.0*  ALBUMIN 3.2* 2.8*   No results for input(s): "LIPASE", "AMYLASE" in the last 168 hours. No results for input(s): "AMMONIA" in the last 168 hours. Coagulation Profile: Recent Labs  Lab 11/14/22 1939  INR 1.2   Cardiac Enzymes: No results for input(s): "CKTOTAL", "CKMB", "CKMBINDEX", "TROPONINI" in the last 168 hours. BNP (last 3 results) No results for  input(s): "PROBNP" in the last 8760 hours. HbA1C: No results for input(s): "HGBA1C" in the last 72 hours. CBG: No results for input(s): "GLUCAP" in the last 168 hours. Lipid Profile: No results for  input(s): "CHOL", "HDL", "LDLCALC", "TRIG", "CHOLHDL", "LDLDIRECT" in the last 72 hours. Thyroid Function Tests: No results for input(s): "TSH", "T4TOTAL", "FREET4", "T3FREE", "THYROIDAB" in the last 72 hours. Anemia Panel: No results for input(s): "VITAMINB12", "FOLATE", "FERRITIN", "TIBC", "IRON", "RETICCTPCT" in the last 72 hours. Sepsis Labs: Recent Labs  Lab 11/14/22 1730 11/14/22 1939 11/14/22 2139  PROCALCITON  --  <0.10  --   LATICACIDVEN 1.7  --  2.1*   Recent Results (from the past 240 hour(s))  Blood culture (routine x 2)     Status: None   Collection Time: 11/14/22  5:30 PM   Specimen: BLOOD RIGHT FOREARM  Result Value Ref Range Status   Specimen Description   Final    BLOOD RIGHT FOREARM Performed at [facility] [address] [place], [place] [address]    Special Requests   Final    BOTTLES DRAWN AEROBIC AND ANAEROBIC Blood Culture adequate volume Performed at [facility] [address] [place], [place] [address]    Culture   Final    NO GROWTH 5 DAYS Performed at [facility], [place] [address], [place], [place] [address]    Report Status 11/19/2022 FINAL  Final  Blood culture (routine x 2)     Status: None   Collection Time: 11/14/22  7:39 PM   Specimen: BLOOD  Result Value Ref Range Status   Specimen Description   Final    BLOOD LEFT ANTECUBITAL Performed at [facility] [address] [place], [place] [address]    Special Requests   Final    BOTTLES DRAWN AEROBIC ONLY Blood Culture adequate volume Performed at [facility] [address] [place], [place] [address]    Culture   Final    NO GROWTH 5 DAYS Performed at [facility], [place] [address] [place], [place] [address]     Report Status 11/19/2022 FINAL  Final  Urine Culture     Status: Abnormal   Collection Time: 11/14/22 11:22 PM   Specimen: Urine, Catheterized  Result Value Ref Range Status   Specimen Description   Final    URINE, CATHETERIZED Performed at [facility] [address] [place], [place] [address]    Special Requests   Final    NONE Performed at [facility], [place] [address] [place], [place] [address]    Culture 10,000 COLONIES/mL ENTEROCOCCUS FAECALIS (A)  Final   Report Status 11/18/2022 FINAL  Final   Organism ID, Bacteria ENTEROCOCCUS FAECALIS (A)  Final      Susceptibility   Enterococcus faecalis - MIC*    AMPICILLIN <=2 SENSITIVE Sensitive     NITROFURANTOIN <=16 SENSITIVE Sensitive     VANCOMYCIN 1 SENSITIVE Sensitive     * 10,000 COLONIES/mL ENTEROCOCCUS FAECALIS  Respiratory (~20 pathogens) panel by PCR     Status: None   Collection Time: 11/15/22 10:35 AM   Specimen: Nasopharyngeal Swab; Respiratory  Result Value Ref Range Status   Adenovirus NOT DETECTED NOT DETECTED Final   Coronavirus 229E NOT DETECTED NOT DETECTED Final    Comment: (NOTE) The Coronavirus on the Respiratory Panel, DOES NOT test for the novel  Coronavirus (2019 nCoV)    Coronavirus HKU1 NOT DETECTED NOT DETECTED Final   Coronavirus NL63 NOT DETECTED NOT DETECTED Final   Coronavirus OC43 NOT DETECTED NOT DETECTED Final   Metapneumovirus NOT DETECTED NOT DETECTED Final   Rhinovirus / Enterovirus NOT DETECTED NOT DETECTED  Final   Influenza A NOT DETECTED NOT DETECTED Final   Influenza B NOT DETECTED NOT DETECTED Final   Parainfluenza Virus 1 NOT DETECTED NOT DETECTED Final   Parainfluenza Virus 2 NOT DETECTED NOT DETECTED Final   Parainfluenza Virus 3 NOT DETECTED NOT DETECTED Final   Parainfluenza Virus 4 NOT DETECTED NOT DETECTED Final   Respiratory Syncytial Virus NOT DETECTED NOT DETECTED Final   Bordetella pertussis NOT DETECTED NOT DETECTED Final    Bordetella Parapertussis NOT DETECTED NOT DETECTED Final   Chlamydophila pneumoniae NOT DETECTED NOT DETECTED Final   Mycoplasma pneumoniae NOT DETECTED NOT DETECTED Final    Comment: Performed at [facility], [place] [employer] [place] [address] [place], [place] [address]  Urine Culture     Status: None   Collection Time: 11/17/22  1:02 PM   Specimen: Urine, Catheterized  Result Value Ref Range Status   Specimen Description   Final    URINE, CATHETERIZED Performed at [facility] [address], [place], [place] [address]    Special Requests   Final    NONE Performed at [facility], [place] [address], [place], [place] [address]    Culture   Final    NO GROWTH Performed at [facility], [place] [address], [place], [place] [address]    Report Status 11/18/2022 FINAL  Final    Radiology Studies: CT CHEST WO CONTRAST  Result Date: 11/20/2022 CLINICAL DATA:  Colon cancer staging.  * Tracking Code: BO * EXAM: CT CHEST WITHOUT CONTRAST TECHNIQUE: Multidetector CT imaging of the chest was performed following the standard protocol without IV contrast. RADIATION DOSE REDUCTION: This exam was performed according to the departmental dose-optimization program which includes automated exposure control, adjustment of the mA and/or kV according to patient size and/or use of iterative reconstruction technique. COMPARISON:  CT abdomen/pelvis 11/15/2022 FINDINGS: Cardiovascular: The heart is within normal limits in size. No pericardial effusion. The aorta is normal in caliber. No atherosclerotic calcifications. The right-sided PICC line is noted in good position. Mediastinum/Nodes: No mediastinal or hilar mass or lymphadenopathy. The esophagus is unremarkable. There are enlarged bilateral axillary lymph nodes which are indeterminate. No supraclavicular adenopathy. Lungs/Pleura: No acute pulmonary process. No worrisome pulmonary lesions or pulmonary nodules to suggest metastatic  disease. Small calcified granuloma noted in the right lower lobe. No pleural effusions or pleural nodules. Upper Abdomen: No significant upper abdominal findings. No hepatic lesions are identified. Status post cholecystectomy. Musculoskeletal: Postoperative changes involving the thoracic spine with previous laminectomies and benign-appearing postoperative fluid collection in the subcutaneous fat. Abnormal appearance of the sternal manubrium. Has a somewhat permeative appearance. Could not exclude a neoplastic process. Recommend correlation any sternal pain or tenderness. MRI may be helpful for further evaluation. IMPRESSION: 1. No worrisome pulmonary lesions or pulmonary nodules to suggest metastatic disease. 2. Enlarged bilateral axillary lymph nodes are indeterminate. 3. Abnormal appearance of the sternal manubrium. Could not exclude a permeative neoplastic process. Recommend correlation any sternal pain or tenderness. MRI may be helpful for further evaluation. 4. Postoperative changes involving the thoracic spine with previous laminectomies and benign-appearing postoperative fluid collection in the subcutaneous fat. Electronically Signed   By: Marijo Sanes M.D.   On: 11/20/2022 09:29    Scheduled Meds:  bisacodyl  10 mg Rectal Daily   Chlorhexidine Gluconate Cloth  6 each Topical Daily   docusate sodium  100 mg Oral BID   enoxaparin (LOVENOX) injection  40 mg Subcutaneous Once   [START ON  11/22/2022] feeding supplement  296 mL Oral Once   feeding supplement  592 mL Oral Once   folic acid  1 mg Intravenous Daily   gabapentin  600 mg Oral TID   hydrocortisone cream   Topical BID   lidocaine-EPINEPHrine  10 mL Intradermal Once   loratadine  10 mg Oral Daily   neomycin  1,000 mg Oral 3 times per day   And   metroNIDAZOLE  1,000 mg Oral 3 times per day   nystatin   Topical q12n4p   Ensure Max Protein  11 oz Oral BID   senna  1 tablet Oral BID   Continuous Infusions:  [START ON 11/22/2022]  cefoTEtan (CEFOTAN) IV     famotidine (PEPCID) IV Stopped (11/21/22 1101)   potassium PHOSPHATE IVPB (in mmol) 15 mmol (11/21/22 1227)    LOS: 7 days   Raiford Noble, DO [facility] Available via Epic secure chat 7am-7pm After these hours, please refer to coverage provider listed on [email or web address] 11/21/2022, 4:00 PM

## 2022-11-21 NOTE — Plan of Care (Signed)
  Problem: Fluid Volume: Goal: Hemodynamic stability will improve Outcome: Progressing   Problem: Clinical Measurements: Goal: Diagnostic test results will improve Outcome: Progressing Goal: Signs and symptoms of infection will decrease Outcome: Progressing   Problem: Respiratory: Goal: Ability to maintain adequate ventilation will improve Outcome: Progressing   Problem: Health Behavior/Discharge Planning: Goal: Ability to manage health-related needs will improve Outcome: Progressing   Problem: Clinical Measurements: Goal: Ability to maintain clinical measurements within normal limits will improve Outcome: Progressing Goal: Will remain free from infection Outcome: Progressing Goal: Diagnostic test results will improve Outcome: Progressing Goal: Respiratory complications will improve Outcome: Progressing Goal: Cardiovascular complication will be avoided Outcome: Progressing   Problem: Activity: Goal: Risk for activity intolerance will decrease Outcome: Progressing   Problem: Nutrition: Goal: Adequate nutrition will be maintained Outcome: Progressing   Problem: Coping: Goal: Level of anxiety will decrease Outcome: Progressing   Problem: Elimination: Goal: Will not experience complications related to bowel motility Outcome: Progressing   Problem: Pain Managment: Goal: General experience of comfort will improve Outcome: Progressing   Problem: Safety: Goal: Ability to remain free from injury will improve Outcome: Progressing   Problem: Skin Integrity: Goal: Risk for impaired skin integrity will decrease Outcome: Progressing   Problem: Education: Goal: Knowledge of General Education information will improve Description: Including pain rating scale, medication(s)/side effects and non-pharmacologic comfort measures Outcome: Adequate for Discharge   Problem: Elimination: Goal: Will not experience complications related to urinary retention Outcome: Adequate for  Discharge

## 2022-11-21 NOTE — Progress Notes (Signed)
Nutrition Follow-up  INTERVENTION:   -Ensure MAX Protein po BID, each supplement provides 150 kcal and 30 grams of protein   -D/c Boost Breeze  NUTRITION DIAGNOSIS:   Altered GI function related to constipation (concern for ileus) as evidenced by other (comment) (clear liquid diet).  Now on CHO modified diet.  GOAL:   Patient will meet greater than or equal to 90% of their needs  Progressing  MONITOR:   Diet advancement, PO intake, supplement acceptance  ASSESSMENT:   51 y.o. female with medical history significant of paraplegia as a result of laminectomy, chronic anemia, morbid obesity, hypertension, constipation admitted for rash and symptomatic anemia  Patient currently consuming 75% of meals. Was not drinking Boost Breeze supplements. Will add Ensure Max for additional protein.   Admission weight: 360 lbs. No other weights this admission. Per nursing documentation, pt with mild BLE edema.   Medications: Colace, Folic acid, Senokot, Pepcid, K-Phos  Labs reviewed: Low Phos  Diet Order:   Diet Order             Diet NPO time specified Except for: Sips with Meds  Diet effective midnight           Diet Carb Modified Fluid consistency: Thin; Room service appropriate? Yes  Diet effective now                   EDUCATION NEEDS:   No education needs have been identified at this time  Skin:  Skin Assessment: Skin Integrity Issues: Skin Integrity Issues:: Other (Comment) Other: MSAD bilateral buttocks  Last BM:  11/28 -type 7  Height:   Ht Readings from Last 1 Encounters:  11/14/22 '5\' 6"'$  (1.676 m)    Weight:   Wt Readings from Last 1 Encounters:  11/14/22 (!) 163.3 kg    Ideal Body Weight:  53.2 kg (subtracted 10% for paraplegia)  BMI:  Body mass index is 58.11 kg/m.  Estimated Nutritional Needs:   Kcal:  2250-2400 kcal  Protein:  85-95 grams  Fluid:  </= 2.2L  Clayton Bibles, MS, RD, LDN Inpatient Clinical Dietitian Contact information  available via Amion

## 2022-11-21 NOTE — Anesthesia Preprocedure Evaluation (Addendum)
Anesthesia Evaluation  Patient identified by MRN, date of birth, ID band Patient awake    Reviewed: Allergy & Precautions, NPO status , Patient's Chart, lab work & pertinent test results  Airway Mallampati: II  TM Distance: >3 FB Neck ROM: Full    Dental  (+) Teeth Intact, Dental Advisory Given   Pulmonary asthma , former smoker   Pulmonary exam normal breath sounds clear to auscultation       Cardiovascular hypertension, Pt. on medications Normal cardiovascular exam Rhythm:Regular Rate:Normal     Neuro/Psych  Headaches PSYCHIATRIC DISORDERS  Depression    Paraplegic s/p lumbar surgery 2020- bedbound  Neuromuscular disease    GI/Hepatic Neg liver ROS,GERD  Controlled,,presented with constipation for several months, no hematochezia or melena.she presented worsening skin rash, was found to have significant anemia with hemoglobin 6.8, and she was admitted for further management.  He received 2 unit of blood transfusion, CT abdomen pelvis showed circumferential irregular thickening of the ascending colon proximal to the hepatic flexure, small mesenteric lymph nodes, and a second potential site of thickening in the sigmoid colon, concerning for colon cancer.  She underwent colonoscopy yesterday, which showed two masses in the hepatic flexure and sigmoid colon   Endo/Other    Morbid obesitySuper morbid obesity BMI 58  Renal/GU negative Renal ROS  negative genitourinary   Musculoskeletal negative musculoskeletal ROS (+)    Abdominal  (+) + obese  Peds  Hematology  (+) Blood dyscrasia, anemia Hb 7.9 this AM, plt 163   Anesthesia Other Findings   Reproductive/Obstetrics negative OB ROS                             Anesthesia Physical Anesthesia Plan  ASA: 4  Anesthesia Plan: General   Post-op Pain Management: Ofirmev IV (intra-op)* and Ketamine IV*   Induction: Intravenous  PONV Risk Score and  Plan: 4 or greater and Ondansetron, Treatment may vary due to age or medical condition and Midazolam  Airway Management Planned: Oral ETT  Additional Equipment: Arterial line  Intra-op Plan:   Post-operative Plan: Extubation in OR and Possible Post-op intubation/ventilation  Informed Consent: I have reviewed the patients History and Physical, chart, labs and discussed the procedure including the risks, benefits and alternatives for the proposed anesthesia with the patient or authorized representative who has indicated his/her understanding and acceptance.     Dental advisory given  Plan Discussed with: CRNA  Anesthesia Plan Comments: (Crossmatch x 2 units, will start 1 unit with induction as Hb this AM is 7.9.  May also potentially need plt transfusion as they continue to trend down. Arterial line for monitoring of labs as well as BP d/t difficulty with NIBP during cscope 2/2 habitus. Solumedrol x 1 for possible paraneoplastic rash, will avoid further steroids. Multiple prior abdominal surgeries and potential for adhesions. Access: PICC, will need additional large bore PIVs vs central line)        Anesthesia Quick Evaluation

## 2022-11-22 ENCOUNTER — Encounter (HOSPITAL_COMMUNITY)
Admission: EM | Disposition: A | Discharge status: Home Health | Payer: Self-pay | Source: Home / Self Care | Attending: Internal Medicine

## 2022-11-22 ENCOUNTER — Encounter (HOSPITAL_COMMUNITY): Payer: Self-pay | Admitting: Internal Medicine

## 2022-11-22 ENCOUNTER — Inpatient Hospital Stay (HOSPITAL_COMMUNITY): Payer: Medicare HMO | Admitting: Anesthesiology

## 2022-11-22 ENCOUNTER — Other Ambulatory Visit: Payer: Self-pay

## 2022-11-22 DIAGNOSIS — I10 Essential (primary) hypertension: Secondary | ICD-10-CM

## 2022-11-22 DIAGNOSIS — J45909 Unspecified asthma, uncomplicated: Secondary | ICD-10-CM

## 2022-11-22 DIAGNOSIS — K6389 Other specified diseases of intestine: Secondary | ICD-10-CM

## 2022-11-22 DIAGNOSIS — Z87891 Personal history of nicotine dependence: Secondary | ICD-10-CM | POA: Diagnosis not present

## 2022-11-22 DIAGNOSIS — E876 Hypokalemia: Secondary | ICD-10-CM | POA: Diagnosis not present

## 2022-11-22 DIAGNOSIS — R21 Rash and other nonspecific skin eruption: Secondary | ICD-10-CM | POA: Diagnosis not present

## 2022-11-22 DIAGNOSIS — D72823 Leukemoid reaction: Secondary | ICD-10-CM | POA: Diagnosis not present

## 2022-11-22 HISTORY — PX: LAPAROSCOPIC PARTIAL RIGHT COLECTOMY: SHX5913

## 2022-11-22 LAB — POCT I-STAT 7, (LYTES, BLD GAS, ICA,H+H)
Acid-base deficit: 3 mmol/L — ABNORMAL HIGH (ref 0.0–2.0)
Acid-base deficit: 5 mmol/L — ABNORMAL HIGH (ref 0.0–2.0)
Bicarbonate: 21.8 mmol/L (ref 20.0–28.0)
Bicarbonate: 20.4 mmol/L (ref 20.0–28.0)
Calcium, Ion: 1.15 mmol/L (ref 1.15–1.40)
Calcium, Ion: 1.1 mmol/L — ABNORMAL LOW (ref 1.15–1.40)
HCT: 25 % — ABNORMAL LOW (ref 36.0–46.0)
HCT: 27 % — ABNORMAL LOW (ref 36.0–46.0)
Hemoglobin: 8.5 g/dL — ABNORMAL LOW (ref 12.0–15.0)
Hemoglobin: 9.2 g/dL — ABNORMAL LOW (ref 12.0–15.0)
O2 Saturation: 100 %
O2 Saturation: 99 %
Potassium: 3.6 mmol/L (ref 3.5–5.1)
Potassium: 3.5 mmol/L (ref 3.5–5.1)
Sodium: 144 mmol/L (ref 135–145)
Sodium: 145 mmol/L (ref 135–145)
TCO2: 23 mmol/L (ref 22–32)
TCO2: 22 mmol/L (ref 22–32)
pCO2 arterial: 36.1 mmHg (ref 32–48)
pCO2 arterial: 37.8 mmHg (ref 32–48)
pH, Arterial: 7.389 (ref 7.35–7.45)
pH, Arterial: 7.341 — ABNORMAL LOW (ref 7.35–7.45)
pO2, Arterial: 438 mmHg — ABNORMAL HIGH (ref 83–108)
pO2, Arterial: 141 mmHg — ABNORMAL HIGH (ref 83–108)

## 2022-11-22 LAB — CBC WITH DIFFERENTIAL/PLATELET
Abs Immature Granulocytes: 0.24 10*3/uL — ABNORMAL HIGH (ref 0.00–0.07)
Basophils Absolute: 0 10*3/uL (ref 0.0–0.1)
Basophils Relative: 0 %
Eosinophils Absolute: 0 10*3/uL (ref 0.0–0.5)
Eosinophils Relative: 0 %
HCT: 27.4 % — ABNORMAL LOW (ref 36.0–46.0)
Hemoglobin: 7.9 g/dL — ABNORMAL LOW (ref 12.0–15.0)
Immature Granulocytes: 2 %
Lymphocytes Relative: 12 %
Lymphs Abs: 1.4 10*3/uL (ref 0.7–4.0)
MCH: 20.4 pg — ABNORMAL LOW (ref 26.0–34.0)
MCHC: 28.8 g/dL — ABNORMAL LOW (ref 30.0–36.0)
MCV: 70.8 fL — ABNORMAL LOW (ref 80.0–100.0)
Monocytes Absolute: 0.7 10*3/uL (ref 0.1–1.0)
Monocytes Relative: 6 %
Neutro Abs: 9.4 10*3/uL — ABNORMAL HIGH (ref 1.7–7.7)
Neutrophils Relative %: 80 %
Platelets: 163 10*3/uL (ref 150–400)
RBC: 3.87 MIL/uL (ref 3.87–5.11)
RDW: 32.2 % — ABNORMAL HIGH (ref 11.5–15.5)
WBC: 11.8 10*3/uL — ABNORMAL HIGH (ref 4.0–10.5)
nRBC: 0 % (ref 0.0–0.2)

## 2022-11-22 LAB — COMPREHENSIVE METABOLIC PANEL
ALT: 12 U/L (ref 0–44)
AST: 14 U/L — ABNORMAL LOW (ref 15–41)
Albumin: 3 g/dL — ABNORMAL LOW (ref 3.5–5.0)
Alkaline Phosphatase: 46 U/L (ref 38–126)
Anion gap: 5 (ref 5–15)
BUN: 12 mg/dL (ref 6–20)
CO2: 24 mmol/L (ref 22–32)
Calcium: 8.1 mg/dL — ABNORMAL LOW (ref 8.9–10.3)
Chloride: 112 mmol/L — ABNORMAL HIGH (ref 98–111)
Creatinine, Ser: 0.52 mg/dL (ref 0.44–1.00)
GFR, Estimated: >60 mL/min (ref >60)
Glucose, Bld: 143 mg/dL — ABNORMAL HIGH (ref 70–99)
Potassium: 3.7 mmol/L (ref 3.5–5.1)
Sodium: 141 mmol/L (ref 135–145)
Total Bilirubin: 0.4 mg/dL (ref 0.3–1.2)
Total Protein: 6.2 g/dL — ABNORMAL LOW (ref 6.5–8.1)

## 2022-11-22 LAB — MAGNESIUM: Magnesium: 2.5 mg/dL — ABNORMAL HIGH (ref 1.7–2.4)

## 2022-11-22 LAB — PREPARE RBC (CROSSMATCH)

## 2022-11-22 LAB — PHOSPHORUS: Phosphorus: 2.1 mg/dL — ABNORMAL LOW (ref 2.5–4.6)

## 2022-11-22 LAB — CEA: CEA: 51.3 ng/mL — ABNORMAL HIGH (ref 0.0–4.7)

## 2022-11-22 SURGERY — LAPAROSCOPIC PARTIAL RIGHT COLECTOMY
Anesthesia: General

## 2022-11-22 MED ORDER — SUCCINYLCHOLINE CHLORIDE 200 MG/10ML IV SOSY
PREFILLED_SYRINGE | INTRAVENOUS | Status: AC
  Filled 2022-11-22: qty 10

## 2022-11-22 MED ORDER — MORPHINE SULFATE (PF) 2 MG/ML IV SOLN
2.0000 mg | INTRAVENOUS | Status: DC | PRN
  Administered 2022-11-22 – 2022-12-06 (×13): 2 mg via INTRAVENOUS
  Filled 2022-11-22 (×14): qty 1

## 2022-11-22 MED ORDER — HYDROMORPHONE HCL 1 MG/ML IJ SOLN
INTRAMUSCULAR | Status: AC
  Filled 2022-11-22: qty 1

## 2022-11-22 MED ORDER — DEXAMETHASONE SODIUM PHOSPHATE 10 MG/ML IJ SOLN
INTRAMUSCULAR | Status: AC
  Filled 2022-11-22: qty 1

## 2022-11-22 MED ORDER — PHENOL 1.4 % MT LIQD
1.0000 | OROMUCOSAL | Status: DC | PRN
  Administered 2022-11-27: 1 via OROMUCOSAL
  Filled 2022-11-22: qty 177

## 2022-11-22 MED ORDER — LIDOCAINE HCL (CARDIAC) PF 100 MG/5ML IV SOSY
PREFILLED_SYRINGE | INTRAVENOUS | Status: DC | PRN
  Administered 2022-11-22: 20 mg via INTRATRACHEAL

## 2022-11-22 MED ORDER — 0.9 % SODIUM CHLORIDE (POUR BTL) OPTIME
TOPICAL | Status: DC | PRN
  Administered 2022-11-22: 2000 mL

## 2022-11-22 MED ORDER — OXYCODONE HCL 5 MG/5ML PO SOLN: 5.0000 mg | Freq: Once | ORAL | Status: AC | PRN

## 2022-11-22 MED ORDER — LACTATED RINGERS IV SOLN: INTRAVENOUS | Status: DC | PRN

## 2022-11-22 MED ORDER — ONDANSETRON HCL 4 MG/2ML IJ SOLN: 4.0000 mg | Freq: Once | INTRAMUSCULAR | Status: DC | PRN

## 2022-11-22 MED ORDER — HYDROMORPHONE HCL 1 MG/ML IJ SOLN
0.2500 mg | INTRAMUSCULAR | Status: DC | PRN
  Administered 2022-11-22 (×4): 0.5 mg via INTRAVENOUS

## 2022-11-22 MED ORDER — MIDAZOLAM HCL 5 MG/5ML IJ SOLN
INTRAMUSCULAR | Status: DC | PRN
  Administered 2022-11-22: 2 mg via INTRAVENOUS

## 2022-11-22 MED ORDER — SODIUM CHLORIDE 0.9 % IV SOLN: 10.0000 mL/h | Freq: Once | INTRAVENOUS | Status: AC

## 2022-11-22 MED ORDER — AMISULPRIDE (ANTIEMETIC) 5 MG/2ML IV SOLN: 10.0000 mg | Freq: Once | INTRAVENOUS | Status: DC | PRN

## 2022-11-22 MED ORDER — OXYCODONE HCL 5 MG PO TABS
ORAL_TABLET | ORAL | Status: AC
  Filled 2022-11-22: qty 1

## 2022-11-22 MED ORDER — PHENYLEPHRINE HCL (PRESSORS) 10 MG/ML IV SOLN
INTRAVENOUS | Status: AC
  Filled 2022-11-22: qty 1

## 2022-11-22 MED ORDER — PHENYLEPHRINE 80 MCG/ML (10ML) SYRINGE FOR IV PUSH (FOR BLOOD PRESSURE SUPPORT)
PREFILLED_SYRINGE | INTRAVENOUS | Status: AC
  Filled 2022-11-22: qty 10

## 2022-11-22 MED ORDER — PHENYLEPHRINE HCL-NACL 20-0.9 MG/250ML-% IV SOLN
INTRAVENOUS | Status: DC | PRN
  Administered 2022-11-22: 25 ug/min via INTRAVENOUS

## 2022-11-22 MED ORDER — OXYCODONE HCL 5 MG PO TABS
5.0000 mg | ORAL_TABLET | Freq: Once | ORAL | Status: AC | PRN
  Administered 2022-11-22: 5 mg via ORAL

## 2022-11-22 MED ORDER — BUPIVACAINE-EPINEPHRINE (PF) 0.25% -1:200000 IJ SOLN
INTRAMUSCULAR | Status: AC
  Filled 2022-11-22: qty 30

## 2022-11-22 MED ORDER — OXYCODONE HCL 5 MG PO TABS
5.0000 mg | ORAL_TABLET | ORAL | Status: DC | PRN
  Administered 2022-11-24 – 2022-12-06 (×29): 10 mg via ORAL
  Filled 2022-11-22 (×29): qty 2

## 2022-11-22 MED ORDER — ENSURE SURGERY PO LIQD
237.0000 mL | Freq: Two times a day (BID) | ORAL | Status: DC
  Administered 2022-11-24: 237 mL via ORAL
  Filled 2022-11-22 (×10): qty 237

## 2022-11-22 MED ORDER — ENOXAPARIN SODIUM 40 MG/0.4ML IJ SOSY
40.0000 mg | PREFILLED_SYRINGE | INTRAMUSCULAR | Status: DC
  Administered 2022-11-23 – 2022-11-27 (×5): 40 mg via SUBCUTANEOUS
  Filled 2022-11-22 (×5): qty 0.4

## 2022-11-22 MED ORDER — ROCURONIUM BROMIDE 10 MG/ML (PF) SYRINGE
PREFILLED_SYRINGE | INTRAVENOUS | Status: AC
  Filled 2022-11-22: qty 10

## 2022-11-22 MED ORDER — ACETAMINOPHEN 500 MG PO TABS: 1000.0000 mg | ORAL_TABLET | Freq: Four times a day (QID) | ORAL | Status: DC | PRN

## 2022-11-22 MED ORDER — SUGAMMADEX SODIUM 500 MG/5ML IV SOLN
INTRAVENOUS | Status: DC | PRN
  Administered 2022-11-22: 500 mg via INTRAVENOUS

## 2022-11-22 MED ORDER — MIDAZOLAM HCL 2 MG/2ML IJ SOLN
INTRAMUSCULAR | Status: AC
  Filled 2022-11-22: qty 2

## 2022-11-22 MED ORDER — ROCURONIUM BROMIDE 10 MG/ML (PF) SYRINGE
PREFILLED_SYRINGE | INTRAVENOUS | Status: DC | PRN
  Administered 2022-11-22: 20 mg via INTRAVENOUS
  Administered 2022-11-22: 150 mg via INTRAVENOUS

## 2022-11-22 MED ORDER — PROPOFOL 10 MG/ML IV BOLUS
INTRAVENOUS | Status: AC
  Filled 2022-11-22: qty 20

## 2022-11-22 MED ORDER — ONDANSETRON HCL 4 MG/2ML IJ SOLN
INTRAMUSCULAR | Status: AC
  Filled 2022-11-22: qty 2

## 2022-11-22 MED ORDER — BUPIVACAINE-EPINEPHRINE (PF) 0.25% -1:200000 IJ SOLN
INTRAMUSCULAR | Status: DC | PRN
  Administered 2022-11-22: 30 mL

## 2022-11-22 MED ORDER — PROPOFOL 10 MG/ML IV BOLUS
INTRAVENOUS | Status: DC | PRN
  Administered 2022-11-22: 300 mg via INTRAVENOUS

## 2022-11-22 MED ORDER — FENTANYL CITRATE (PF) 250 MCG/5ML IJ SOLN
INTRAMUSCULAR | Status: AC
  Filled 2022-11-22: qty 5

## 2022-11-22 MED ORDER — LIDOCAINE HCL (PF) 2 % IJ SOLN
INTRAMUSCULAR | Status: AC
  Filled 2022-11-22: qty 5

## 2022-11-22 MED ORDER — FENTANYL CITRATE (PF) 250 MCG/5ML IJ SOLN
INTRAMUSCULAR | Status: DC | PRN
  Administered 2022-11-22 (×3): 50 ug via INTRAVENOUS
  Administered 2022-11-22: 100 ug via INTRAVENOUS

## 2022-11-22 SURGICAL SUPPLY — 36 items
ADH SKN CLS APL DERMABOND .7 (GAUZE/BANDAGES/DRESSINGS) ×1
COVER SURGICAL LIGHT HANDLE (MISCELLANEOUS) IMPLANT
DERMABOND ADVANCED .7 DNX12 (GAUZE/BANDAGES/DRESSINGS) IMPLANT
DRAPE WARM FLUID 44X44 (DRAPES) IMPLANT
DRSG OPSITE POSTOP 3X4 (GAUZE/BANDAGES/DRESSINGS) IMPLANT
ELECT PENCIL ROCKER SW 15FT (MISCELLANEOUS) IMPLANT
ELECT REM PT RETURN 15FT ADLT (MISCELLANEOUS) IMPLANT
GLOVE SURG SS PI 7.0 STRL IVOR (GLOVE) IMPLANT
GOWN STRL REUS W/ TWL LRG LVL3 (GOWN DISPOSABLE) IMPLANT
GOWN STRL REUS W/TWL LRG LVL3 (GOWN DISPOSABLE) ×1
IRRIG SUCT STRYKERFLOW 2 WTIP (MISCELLANEOUS) ×1
IRRIGATION SUCT STRKRFLW 2 WTP (MISCELLANEOUS) IMPLANT
KIT BASIN OR (CUSTOM PROCEDURE TRAY) IMPLANT
PACK GENERAL/GYN (CUSTOM PROCEDURE TRAY) IMPLANT
RELOAD STAPLE 60 3.6 BLU REG (STAPLE) IMPLANT
RELOAD STAPLER BLUE 60MM (STAPLE) ×7 IMPLANT
SCISSORS LAP 5X45 EPIX DISP (ENDOMECHANICALS) IMPLANT
SHEARS HARMONIC ACE PLUS 36CM (ENDOMECHANICALS) IMPLANT
SLEEVE ADV FIXATION 12X100MM (TROCAR) IMPLANT
SLEEVE Z-THREAD 5X100MM (TROCAR) IMPLANT
STAPLER 90 3.5 STAND SLIM (STAPLE) ×1
STAPLER 90 3.5 STD SLIM (STAPLE) IMPLANT
STAPLER ECHELON LONG 60 440 (INSTRUMENTS) IMPLANT
STAPLER RELOAD BLUE 60MM (STAPLE) ×7
SUT MNCRL AB 4-0 PS2 18 (SUTURE) IMPLANT
SUT PDS AB 0 CT1 36 (SUTURE) IMPLANT
SUT SILK 2 0 SH CR/8 (SUTURE) IMPLANT
SUT SILK 3 0 SH CR/8 (SUTURE) IMPLANT
SUT VIC AB 3-0 SH 18 (SUTURE) IMPLANT
SUT VIC AB 3-0 SH 8-18 (SUTURE) IMPLANT
SYS WOUND ALEXIS 18CM MED (MISCELLANEOUS) ×1
SYSTEM WOUND ALEXIS 18CM MED (MISCELLANEOUS) IMPLANT
TOWEL OR 17X26 10 PK STRL BLUE (TOWEL DISPOSABLE) IMPLANT
TRAY FOLEY MTR SLVR 14FR STAT (SET/KITS/TRAYS/PACK) IMPLANT
TROCAR ADV FIXATION 12X100MM (TROCAR) IMPLANT
TROCAR Z-THREAD OPTICAL 5X100M (TROCAR) IMPLANT

## 2022-11-22 NOTE — Progress Notes (Signed)
OT Cancellation Note  Patient Details Name: Tashya Alberty MRN: [id number] DOB: 1971-03-08   Cancelled Treatment:    Reason Eval/Treat Not Completed: Patient is off the unit for a procedure or test/ unavailable  Leota Sauers, OTR/L 11/22/2022, 5:23 PM

## 2022-11-22 NOTE — Op Note (Signed)
Preoperative diagnosis: right colon mass, sigmoid mass  Postoperative diagnosis: same   Procedure: laparoscopic right colectomy with ileo-colic anastomosis, laparoscopic sigmoid resection with end colostomy, skin biopsy  Surgeon: Gurney Maxin, M.D.  Asst: Alferd Apa, M.D.  Anesthesia: general  Indications for procedure: Ellen Hill is a 51 y.o. 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  Description of procedure: The patient was brought into the operative suite. Anesthesia was administered with General endotracheal anesthesia. WHO checklist was applied. The patient was then placed in supine position. The area was prepped and draped in the usual sterile fashion.  An elliptical incision was made in the left subcostal area. The skin was was removed for biopsy. Next, the incision was used for optical entry. A 59m trocar was used to gain access to the peritoneal cavity by optical entry technique. Pneumoperitoneum was applied with a high flow and low pressure. The laparoscope was reinserted to confirm position.  4 additional trocars were placed one 5 mm trocar in the subxiphoid space, one 5 mm trocar in the upper umbilical space, one 5 mm trocar in the right mid abdomen, and one 12 mm trocar in the right lower quadrant.  Elliptical incision was made in the area of the right lower quadrant prior to the 12 mm trocar for a second skin biopsy.  A right-sided T AP block was placed with Marcaine.  On initial evaluation of the abdomen there were some filmy adhesions of the omentum to the lower abdominal wall.  These were taken down with harmonic scalpel.  The colon appeared distended throughout.  The tattoo of the proximal transverse colon was identified.  There was a mass seen 10 cm proximal. The white line of Toldt was freed on the right side of the colon the transverse colon and hepatic flexure were  freed of peritoneal reflection and mobilization was continued lateral medially.  A portion of the distal ileum was chosen for transection.  There were a few adhesions of the terminal ileum to the sidewall which were taken down harmonic scalpel and scissors.  A 60 mm Echelon blue load stapler was used to divide the terminal ileum.  The harmonic scalpel was then continued to take the mesentery of the right colon.  This allowed complete mobilization of the right colon.  Next, attention was turned to the sigmoid colon.  A tattoo was seen in the distal sigmoid region.  Dr. TMarcello Moorespresented the idea of performing a lap assisted resection.  Therefore, the left white line of Toldt was mobilized with harmonic scalpel up to the splenic flexure.  This allowed mobilization of the sigmoid colon and left colon beyond midline.  Next, the suprapubic incision was lengthened.  Cautery was used to dissect down through subcutaneous tissue and the fascia was entered in the midline.  A wound protector was put in place.  Sigmoid colon was then brought out this wound.  Knee was incised on each side.  This allowed blunt dissection to get a space under the distal sigmoid colon.  2 60 mm Echelon blue load staplers were then used to divide the distal sigmoid colon.  Harmonic scalpel was used to take the mesentery.  A mass was palpated about 10 cm proximal to the tattoo.  A spot 10 cm beyond the palpable mass was chosen for resection.  A 60 mm blue load Echelon stapler was used to divide the proximal  sigmoid colon.  Next, the transverse colon was brought up into the supraumbilical wound.  The ileum was identified and brought up into the wound.  A side-to-side anastomosis was made of the ileum to the transverse colon using a 60 mm blue load Echelon stapler.  A 90 mm TA stapler was used to close the enterotomy as well as resect additional ileum.  3-0 silk was used to place a antitension stitch as well as imbricate majority of the staple  line.  This anastomosis appeared patent.  Next, the left colon was further freed from the lateral abdominal wall and retroperitoneal attachments.  Once it was ensured to be fully mobile the left subcostal incision was enlarged and cautery was used to dissect through the subcutaneous and fascia was divided for an ostomy placement.  The end of the left colon was brought through the site.  The abdomen was irrigated with saline.  The mesentery dissections were inspected and appeared hemostatic.  Marcaine was infused into the fascia and muscle.  The fascia of the supraumbilical incision was closed with 0 PDS in running fashion.  The skin of the incisions was closed with 4-0 Monocryl.  Ostomy was matured with 3 cm of brooking using 3-0 Vicryl.  Ostomy appliance was put in place.  Dressing was placed over the supraumbilical incision.  Dermabond was put over the laparoscopic ports.  Counts were correct.  Patient will from anesthesia brought to PACU in stable condition.  Findings: Dilated colon.  Intact anastomosis of ileum to transverse colon.  Specimen: Right colon with terminal ileum, sigmoid colon with stitch marking proximal  Implant: ostomy appliance   Blood loss: 150 ml  Local anesthesia:  30 ml marcaine   Complications: none  Gurney Maxin, M.D. General, Bariatric, & Minimally Invasive Surgery [facility]

## 2022-11-22 NOTE — Consult Note (Signed)
WOC consulted for new ostomy, [place] nursing team aware of patient an will follow up Monday for ostomy needs.   [place], [place], [place]

## 2022-11-22 NOTE — Anesthesia Procedure Notes (Signed)
Procedure Name: Intubation Date/Time: 11/22/2022 10:07 AM  Performed by: Pilar Grammes, CRNAPre-anesthesia Checklist: Patient identified, Emergency Drugs available, Suction available, Patient being monitored and Timeout performed Patient Re-evaluated:Patient Re-evaluated prior to induction Oxygen Delivery Method: Circle system utilized Preoxygenation: Pre-oxygenation with 100% oxygen Induction Type: IV induction Ventilation: Mask ventilation without difficulty Laryngoscope Size: Miller and Mac Grade View: Grade I Tube type: Oral Tube size: 7.0 mm Number of attempts: 1 Airway Equipment and Method: Stylet and Video-laryngoscopy Placement Confirmation: positive ETCO2, ETT inserted through vocal cords under direct vision, CO2 detector and breath sounds checked- equal and bilateral Secured at: 23 cm Tube secured with: Tape Dental Injury: Teeth and Oropharynx as per pre-operative assessment

## 2022-11-22 NOTE — Transfer of Care (Signed)
Immediate Anesthesia Transfer of Care Note  Patient: Ellen Hill  Procedure(s) Performed: LAPAROSCOPIC RIGHT COLECTOMY WITH ANASTOMOSIS, SIGMOID RESECTION WITH COLOSTOMY, AND BIOPSY OF ABDOMINAL SKIN  Patient Location: PACU  Anesthesia Type:General  Level of Consciousness: awake, drowsy, and patient cooperative  Airway & Oxygen Therapy: Patient connected to face mask oxygen  Post-op Assessment: Report given to RN and Post -op Vital signs reviewed and stable  Post vital signs: stable  Last Vitals:  Vitals Value Taken Time  BP    Temp    Pulse 96 11/22/22 1359  Resp 19 11/22/22 1402  SpO2 90 % 11/22/22 1359  Vitals shown include unvalidated device data.  Last Pain:  Vitals:   11/22/22 0840  TempSrc: Oral  PainSc: 0-No pain      Patients Stated Pain Goal: 2 (97/74/14 2395)  Complications: No notable events documented.

## 2022-11-22 NOTE — Anesthesia Procedure Notes (Signed)
Central Venous Catheter Insertion Performed by: Pervis Hocking, DO, anesthesiologist Start/End12/12/2021 10:18 AM, 11/22/2022 10:26 AM Patient location: OR. Preanesthetic checklist: patient identified, IV checked, site marked, risks and benefits discussed, surgical consent, monitors and equipment checked, pre-op evaluation, timeout performed and anesthesia consent Position: Trendelenburg Lidocaine 1% used for infiltration and patient sedated Hand hygiene performed  and maximum sterile barriers used  Catheter size: 8 Fr Total catheter length 16. Central line was placed.Double lumen Procedure performed using ultrasound guided technique. Ultrasound Notes:anatomy identified, needle tip was noted to be adjacent to the nerve/plexus identified, no ultrasound evidence of intravascular and/or intraneural injection and image(s) printed for medical record Attempts: 1 Following insertion, dressing applied, line sutured and Biopatch. Post procedure assessment: blood return through all ports  Patient tolerated the procedure well with no immediate complications.

## 2022-11-22 NOTE — Anesthesia Procedure Notes (Signed)
Arterial Line Insertion Start/End12/12/2021 10:07 AM, 11/22/2022 10:12 AM Performed by: Pervis Hocking, DO, anesthesiologist  Patient location: Pre-op. Preanesthetic checklist: patient identified, IV checked, site marked, risks and benefits discussed, surgical consent, monitors and equipment checked, pre-op evaluation, timeout performed and anesthesia consent Lidocaine 1% used for infiltration Left, radial was placed Catheter size: 20 G Hand hygiene performed  and maximum sterile barriers used   Attempts: 1 Procedure performed using ultrasound guided technique. Ultrasound Notes:anatomy identified, needle tip was noted to be adjacent to the nerve/plexus identified, no ultrasound evidence of intravascular and/or intraneural injection and image(s) printed for medical record Following insertion, dressing applied. Post procedure assessment: normal and unchanged  Post procedure complications: hemorrhage. Patient tolerated the procedure well with no immediate complications.

## 2022-11-22 NOTE — Progress Notes (Signed)
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 No further bleeding  Objective: Vital signs in last 24 hours: Temp:  [97.5 F (36.4 C)-97.8 F (36.6 C)] 97.5 F (36.4 C) (12/01 0511) Pulse Rate:  [78-81] 78 (12/01 0511) Resp:  [18-20] 18 (12/01 0511) BP: (128-151)/(69-80) 128/72 (12/01 0511) SpO2:  [92 %-100 %] 100 % (12/01 0511) Last BM Date : 11/19/22 Intake/Output from previous day: 11/30 0701 - 12/01 0700 In: 1242.9 [P.O.:860; IV Piggyback:382.9] Out: 300 [Urine:300]  PE: Gen: NAd Resp: nonlabored Card: RRR Abd: soft, NT  Lab Results:  Recent Labs    11/21/22 0329 11/22/22 0455  WBC 10.4 11.8*  HGB 8.5* 7.9*  HCT 29.7* 27.4*  PLT 142* 163   Recent Labs    11/21/22 0329 11/22/22 0455  NA 142 141  K 4.0 3.7  CL 112* 112*  CO2 23 24  GLUCOSE 160* 143*  BUN 10 12  CREATININE 0.72 0.52  CALCIUM 8.1* 8.1*   No results for input(s): "LABPROT", "INR" in the last 72 hours.    Component Value Date/Time   NA 141 11/22/2022 0455   K 3.7 11/22/2022 0455   CL 112 (H) 11/22/2022 0455   CO2 24 11/22/2022 0455   GLUCOSE 143 (H) 11/22/2022 0455   BUN 12 11/22/2022 0455   CREATININE 0.52 11/22/2022 0455   CALCIUM 8.1 (L) 11/22/2022 0455   PROT 6.2 (L) 11/22/2022 0455   ALBUMIN 3.0 (L) 11/22/2022 0455   AST 14 (L) 11/22/2022 0455   ALT 12 11/22/2022 0455   ALKPHOS 46 11/22/2022 0455   BILITOT 0.4 11/22/2022 0455   GFRNONAA >60 11/22/2022 0455   GFRAA >60 03/01/2019 0612    Anti-infectives: Anti-infectives (From admission, onward)    Start     Dose/Rate Route Frequency Ordered Stop   11/22/22 0600  cefoTEtan (CEFOTAN) 2 g in sodium chloride 0.9 % 100 mL IVPB        2 g 200 mL/hr over 30 Minutes Intravenous On call to O.R. 11/21/22 1427 11/23/22 0559   11/21/22 2200  neomycin (MYCIFRADIN) tablet 1,000 mg  Status:  Discontinued       See Hyperspace  for full Linked Orders Report.   1,000 mg Oral 3 times per day 11/21/22 1427 11/21/22 1733   11/21/22 2200  metroNIDAZOLE (FLAGYL) tablet 1,000 mg  Status:  Discontinued       See Hyperspace for full Linked Orders Report.   1,000 mg Oral 3 times per day 11/21/22 1427 11/21/22 1733   11/21/22 2200  metroNIDAZOLE (FLAGYL) tablet 1,000 mg       See Hyperspace for full Linked Orders Report.   1,000 mg Oral  Once 11/21/22 1731 11/21/22 2120   11/21/22 2200  neomycin (MYCIFRADIN) tablet 1,000 mg       See Hyperspace for full Linked Orders Report.   1,000 mg Oral  Once 11/21/22 1731 11/21/22 2231   11/21/22 1900  metroNIDAZOLE (FLAGYL) tablet 1,000 mg       See Hyperspace for full Linked Orders Report.   1,000 mg Oral  Once 11/21/22 1731 11/21/22 1918   11/21/22 1900  neomycin (MYCIFRADIN) tablet 1,000 mg       See Hyperspace for full Linked Orders Report.   1,000 mg Oral  Once 11/21/22 1731 11/21/22 1918   11/21/22 1800  metroNIDAZOLE (FLAGYL) tablet 1,000 mg       See Hyperspace for full  Linked Orders Report.   1,000 mg Oral  Once 11/21/22 1731 11/21/22 1753   11/21/22 1800  neomycin (MYCIFRADIN) tablet 1,000 mg       See Hyperspace for full Linked Orders Report.   1,000 mg Oral  Once 11/21/22 1731 11/21/22 1753   11/14/22 1545  cefTRIAXone (ROCEPHIN) 1 g in sodium chloride 0.9 % 100 mL IVPB        1 g 200 mL/hr over 30 Minutes Intravenous  Once 11/14/22 1539 11/15/22 0700       Assessment/Plan  s/p Procedure(s): LAPAROSCOPIC PARTIAL COLECTOMY WITH OSTOMY 11/22/2022  Plan for laparoscopic resection of the 2 areas today, initially thought this would require a TAC but looking at CT scan I think it may be reasonable to perform right colectomy and sigmoid colectomy and save the distal transverse and left colon with end colostomy. Long term this would greatly decrease her risk of high output ostomy given that this ostomy will be permanent in her immobile condition. Her obesity will make any  surgery technically more difficult but a laparoscopic approach can help to decrease wound problems such as infection, dehiscence and hernia. We discussed the risks of leak, bleeding, infection, intestinal or other organ injury, ureter injury, pneumonia, ileus, need for larger incision, and heart stress. We discussed ostomy creation and management. She showed good understanding and wanted to proceed  FEN - NPO VTE - lovenox ID - cefotetan prop Disposition - to surgery today   LOS: 8 days   I reviewed last 24 h vitals and pain scores, last 48 h intake and output, last 24 h labs and trends, and last 24 h imaging results.  This care required high  level of medical decision making.   [facility] 11/22/2022, 8:38 AM Please see Amion for pager number during day hours 7:00am-4:30pm or 7:00am -11:30am on weekends

## 2022-11-22 NOTE — Progress Notes (Signed)
PROGRESS NOTE    Ellen Hill [facility]  NWG:[id number] DOB: [birth year] DOA: 11/14/2022 PCP: [facility]   Brief Narrative:  The Patient is a 51 year old female history of paraplegia as a result of laminectomy, history of chronic anemia, morbid obesity, hypertension, constipation presented with a rash ongoing x 2 weeks which had developed on her anterior trunk and extremities and abdomen started on doxycycline and treated with steroids however no significant improvement.  Patient noted not to have any mucosal involvement or rash of the palms of the hands and the feet no involvement of the posterior body.  Patient also noted to have a history of drug-induced constipation.  Patient noted on 10/30/2022 to be started on Bentyl however reported rash started on her legs progressed up to 2 days after starting Bentyl and using steroids only made rash worse and spread with a burning and itching sensation.  Patient noted to be followed by GI for chronic anemia and was to undergo endoscopy in the OR unfortunately developed rash and unable to get this done.  Patient presented to the ED, noted to have a significant anemia with a hemoglobin of 6.8 and admitted.  Patient transfused 2 units packed red blood cells.  Posttransfusion CBC pending.  CT abdomen and pelvis done due to concern for ileus with circumferential irregular thickening of the ascending colon proximal to the hepatic flexure, small mesenteric lymph nodes findings concerning for colonic neoplasm, second potential site of thickening in the sigmoid also raising concern for colorectal neoplasm, significant fecal impaction, left ureteral calculus/calculi with mild fullness of the left ureter stable appearance of intra renal calculi on the left since previous imaging, signs of body wall edema.  GI consulted for further evaluation.  Patient for colonoscopy 11/19/2022.  ID also consulted for evaluation of rash.    Colonoscopy done after CT scan of the  abdomen pelvis shows circumferential irregular thickening of the ascending colon proximal to the hepatic flexure as well as small mesenteric lymph nodes and a second potential site of thickening in the sigmoid colon.  Colonoscopy was done which showed 2 masses at the hepatic flexure and sigmoid colon biopsy was obtained and results are still pending.  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  Oncology has discussed the role of adjuvant chemotherapy for stage III colon cancer and some high risk stage II disease and she does not have high risk disease she may be a oral chemo candidate with capecitabine due to her paraplegia she would not be a candidate for intravenous chemo.  Patient was taken to the OR for a laparoscopic right colectomy with ileocolonic anastomosis, laparoscopic sigmoid resection with end colostomy and skin biopsy and was seen in the PACU and was somnolent and drowsy after receiving anesthesia.  Assessment and Plan:  Diffuse Rash mildly improving -Patient noted to have developed a rash for the past 2 weeks which have worsened, rash with bullous changes and per ID concern for erythema multiforme with no severe sloughing or Nikolsky sign noted. -Patient with no mucosal involvement and no prior HSV infection. -HIV negative.  RPR nonreactive.  Respiratory viral PCR negative. -Mycoplasma IgM <770.  -ID recommending general surgery evaluation for punch biopsy and will be done  -ID feels could be possibly related to Bentyl. -Also concern for possible paraneoplastic rash. -ID recommending calamine lotion for itching and will need outpatient  follow-up with dermatology. 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 -FOBT negative. -Hemoglobin noted at 6.8 on admission (11/14/2022). -Status posttransfusion 2 units packed red blood cells,  -Anemia panel with iron level of 12, ferritin of 3, folate of 4.6. -CT abdomen and pelvis with circumferential irregular thickening of the ascending colon proximal to the hepatic flexure with adjacent small mesenteric lymph nodes concerning for colonic neoplasm, potential second site in the sigmoid also raising concern for additional colorectal neoplasm of the mid sigmoid colon less likely colitis.  Fecal impaction also noted. -GI consulted for further evaluation and patient started on a bowel prep for colonoscopy.  Status post 2 to 3 days bowel prep which has been completed and patient underwent colonoscopy which showed an ulcerated nonobstructing medium-sized mass found in the rectosigmoid colon where the mass was noncircumferential and oozing was present.  This was biopsied with a cold forceps for histology.  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 Gurney Maxin -She is having a full workup and underwent a CT of the chest without contrast today which showed "No worrisome pulmonary lesions or pulmonary nodules to suggest metastatic disease. Enlarged bilateral axillary lymph nodes are indeterminate. Abnormal appearance of the sternal manubrium. Could not exclude a permeative neoplastic process. Recommend correlation any sternal pain or tenderness. MRI may be helpful for further evaluation. Postoperative changes involving the thoracic spine with previouslaminectomies and benign-appearing postoperative fluid collection in the subcutaneous fat." -Hemoglobin/Hematocrit has gone from 10.3/35.8 -> 9.2/31.5 -> 9.1/31.2 -> 8.5/29.7 -> 7.9/27.4 -> 8.5/25.0 -> 9.2/27.0 -Follow H&H, transfusion threshold hemoglobin < 7. 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 -Oncology has discussed the role of adjuvant chemotherapy for stage III colon cancer and some high risk stage II disease and she does not have high risk disease she may be a oral chemo candidate with capecitabine due to her paraplegia she would not be a candidate for intravenous chemo.   Hypokalemia -Secondary to GI losses.   -Potassium is now 3.7 -Magnesium at 2.5 -Continue potassium repletion and magnesium monitoring -Repeat CMP in the a.m.   Hypophosphatemia -Patient's Phos Level is now 2.1 again -Replete  with IV K Phos 15 mmol -Continue to Monitor and Replete as Necessary -Repeat CMP in the AM    Constipation -Likely drug-induced. -Disimpaction ordered initially on  admission.. -Patient was placed on Dulcolax suppository and Senokot as also received a smog enema.   -Status post bowel prep for in anticipation of colonoscopy .  -Post colonoscopy will likely need to be placed on a bowel regimen. -GI following    Hypertension -BP currently controlled off antihypertensive medications. -Continue to Monitor BP per Protocol -Last BP reading was 120/71   Super Morbid Obesity -Complicates overall prognosis and care -Estimated body mass index is 58.11 kg/m as calculated from the following:   Height as of this encounter: '5\' 6"'$  (1.676 m).   Weight as of this encounter: 163.3 kg.  -Weight Loss and Dietary Counseling given and will need continual Lifestyle modification -Outpatient follow-up with PCP.   Leukocytosis -With a worsening leukocytosis initially but trending back down.  As WBC has gone from 20.8 -> 25.8 -> 18.5 -> 18.0 -> 14.3 -> 10.4 -Currently afebrile. -Repeat UA with trace leukocytes, negative nitrites, 11-20 WBCs.  Urine cultures with 10,000 colonies of Enterococcus faecalis.  Blood cultures pending with no growth to date.  Respiratory viral panel negative. -Chest x-ray with no acute abnormalities.. -Patient nontoxic-appearing. -Monitor off antibiotics at this time and repeat labs in the AM.   Thrombocytopenia -The patient's platelet count dropped from 358 -> 336 -> 203 -> 158 -> 149 -> 142 -Continue to monitor for signs and symptoms of bleeding; no overt bleeding noted -Repeat CBC in a.m.   Paraplegia -As a result of a laminectomy -Continue supportive care  DVT prophylaxis: enoxaparin (LOVENOX) injection 40 mg Start: 11/21/22 1600 SCD's Start: 11/21/22 1428 SCDs Start: 11/14/22 2005    Code Status: Full Code Family Communication: No family currently at bedside  Disposition Plan:  Level of care: Stepdown Status is: Inpatient Remains inpatient appropriate because: Medical improvement and clearance by specialist that she has had  surgical intervention she will need repeat PT and OT evaluations.   Consultants:  Gastroenterology General Surgery Medical Oncology   Procedures:  As delineated as above  Patient underwent laparoscopic right colectomy with ileocolonic anastomosis, laparoscopic sigmoid resection with end colostomy and a skin biopsy  Antimicrobials:  Anti-infectives (From admission, onward)    Start     Dose/Rate Route Frequency Ordered Stop   11/22/22 0600  cefoTEtan (CEFOTAN) 2 g in sodium chloride 0.9 % 100 mL IVPB        2 g 200 mL/hr over 30 Minutes Intravenous On call to O.R. 11/21/22 1427 11/22/22 1014   11/21/22 2200  neomycin (MYCIFRADIN) tablet 1,000 mg  Status:  Discontinued       See Hyperspace for full Linked Orders Report.   1,000 mg Oral 3 times per day 11/21/22 1427 11/21/22 1733   11/21/22 2200  metroNIDAZOLE (FLAGYL) tablet 1,000 mg  Status:  Discontinued       See Hyperspace for full Linked Orders Report.   1,000 mg Oral 3 times per day 11/21/22 1427 11/21/22 1733   11/21/22 2200  metroNIDAZOLE (FLAGYL) tablet 1,000 mg       See Hyperspace for full Linked Orders Report.   1,000 mg Oral  Once 11/21/22 1731 11/21/22 2120   11/21/22 2200  neomycin (MYCIFRADIN) tablet 1,000 mg       See Hyperspace for full Linked Orders Report.   1,000 mg Oral  Once 11/21/22 1731 11/21/22 2231   11/21/22 1900  metroNIDAZOLE (FLAGYL) tablet 1,000 mg       See Hyperspace for full Linked Orders Report.   1,000 mg Oral  Once 11/21/22 1731 11/21/22 1918   11/21/22 1900  neomycin (MYCIFRADIN) tablet 1,000 mg       See Hyperspace for full Linked Orders Report.   1,000 mg Oral  Once 11/21/22 1731 11/21/22 1918   11/21/22 1800  metroNIDAZOLE (FLAGYL) tablet 1,000 mg       See Hyperspace for full Linked Orders Report.   1,000 mg Oral  Once 11/21/22 1731 11/21/22 1753   11/21/22 1800  neomycin (MYCIFRADIN) tablet 1,000 mg       See Hyperspace for full Linked Orders Report.   1,000 mg Oral  Once 11/21/22  1731 11/21/22 1753   11/14/22 1545  cefTRIAXone (ROCEPHIN) 1 g in sodium chloride 0.9 % 100 mL IVPB        1 g 200 mL/hr over 30 Minutes Intravenous  Once 11/14/22 1539 11/15/22 0700       Subjective: Seen and examined at bedside in the PACU and she is very somnolent and drowsy and just come back from surgical intervention and was complaining of some pain.  Unable to get review of subjective history from her due to her current condition.  Appears calm but slightly uncomfortable.  Objective: Vitals:   11/21/22 1147 11/21/22 2045 11/22/22 0511 11/22/22 0840  BP: (!) 151/80 136/69 128/72 118/63  Pulse: 79 81 78 82  Resp: '20 18 18 20  '$ Temp: 97.6 F (36.4 C) 97.8 F (36.6 C) (!) 97.5 F (36.4 C) 97.7 F (36.5 C)  TempSrc: Oral Oral Oral Oral  SpO2: 92% 100% 100% 100%  Weight:    (!) 163.3 kg  Height:    '5\' 6"'$  (1.676 m)    Intake/Output Summary (Last 24 hours) at 11/22/2022 1422 Last data filed at 11/22/2022 1330 Gross per 24 hour  Intake 2733.65 ml  Output 665 ml  Net 2068.65 ml   Filed Weights   11/14/22 1346 11/22/22 0840  Weight: (!) 163.3 kg (!) 163.3 kg   Examination: Physical Exam:  Constitutional: WN/WD super morbidly obese African-American female who appears a little uncomfortable laying in the PACU and has just come out of surgical intervention Respiratory: Diminished to auscultation bilaterally with coarse breath sounds, no wheezing, rales, rhonchi or crackles. Normal respiratory effort and patient is not tachypenic. No accessory muscle use.  Wearing supplemental oxygen via nasal cannula Cardiovascular: Slightly tachycardic rate but regular rhythm, no murmurs / rubs / gallops. S1 and S2 auscultated.  Mild lower extremity edema Abdomen: Soft, tender abdomen and distended secondary body habitus. Bowel sounds positive.  GU: Deferred.  Foley is in place Musculoskeletal: No clubbing / cyanosis of digits/nails.  Has an arterial line as well as a right central venous jugular  catheter Skin: Has diffuse rash noted with some bullae on her skin and some vesicles that are broken or other lesions or she scratched her upper arms, chest as well as her legs Neurologic: A little somnolent and drowsy after her anesthesia but cranial nerves grossly intact Psychiatric: Slightly impaired judgment and insight.  She is somnolent and drowsy  Data Reviewed: I have personally reviewed following labs and imaging studies  CBC: Recent Labs  Lab 11/18/22 0250 11/19/22 0343 11/20/22 0407 11/21/22 0329 11/22/22 0455 11/22/22 1041 11/22/22 1311  WBC 18.5* 18.0* 14.3* 10.4 11.8*  --   --   NEUTROABS 12.6* 12.0* 9.7* 9.0* 9.4*  --   --  HGB 10.3* 9.2* 9.1* 8.5* 7.9* 8.5* 9.2*  HCT 35.8* 31.5* 31.2* 29.7* 27.4* 25.0* 27.0*  MCV 67.8* 68.2* 68.7* 70.0* 70.8*  --   --   PLT 203 158 149* 142* 163  --   --    Basic Metabolic Panel: Recent Labs  Lab 11/18/22 0250 11/19/22 0343 11/20/22 0407 11/21/22 0329 11/22/22 0455 11/22/22 1041 11/22/22 1311  NA 138 134* 139 142 141 144 145  K 3.1* 2.8* 3.5 4.0 3.7 3.6 3.5  CL 107 104 110 112* 112*  --   --   CO2 '22 23 23 23 24  '$ --   --   GLUCOSE 114* 110* 92 160* 143*  --   --   BUN '14 11 10 10 12  '$ --   --   CREATININE 0.62 0.73 0.61 0.72 0.52  --   --   CALCIUM 8.2* 7.8* 7.8* 8.1* 8.1*  --   --   MG 2.2 2.1 2.2 2.2 2.5*  --   --   PHOS  --   --   --  2.1* 2.1*  --   --    GFR: Estimated Creatinine Clearance: 135.5 mL/min (by C-G formula based on SCr of 0.52 mg/dL). Liver Function Tests: Recent Labs  Lab 11/16/22 1107 11/21/22 0329 11/22/22 0455  AST 16 11* 14*  ALT '15 11 12  '$ ALKPHOS 57 52 46  BILITOT 0.9 0.6 0.4  PROT 6.5 6.0* 6.2*  ALBUMIN 3.2* 2.8* 3.0*   No results for input(s): "LIPASE", "AMYLASE" in the last 168 hours. No results for input(s): "AMMONIA" in the last 168 hours. Coagulation Profile: No results for input(s): "INR", "PROTIME" in the last 168 hours. Cardiac Enzymes: No results for input(s):  "CKTOTAL", "CKMB", "CKMBINDEX", "TROPONINI" in the last 168 hours. BNP (last 3 results) No results for input(s): "PROBNP" in the last 8760 hours. HbA1C: No results for input(s): "HGBA1C" in the last 72 hours. CBG: No results for input(s): "GLUCAP" in the last 168 hours. Lipid Profile: No results for input(s): "CHOL", "HDL", "LDLCALC", "TRIG", "CHOLHDL", "LDLDIRECT" in the last 72 hours. Thyroid Function Tests: No results for input(s): "TSH", "T4TOTAL", "FREET4", "T3FREE", "THYROIDAB" in the last 72 hours. Anemia Panel: No results for input(s): "VITAMINB12", "FOLATE", "FERRITIN", "TIBC", "IRON", "RETICCTPCT" in the last 72 hours. Sepsis Labs: No results for input(s): "PROCALCITON", "LATICACIDVEN" in the last 168 hours.  Recent Results (from the past 240 hour(s))  Blood culture (routine x 2)     Status: None   Collection Time: 11/14/22  5:30 PM   Specimen: BLOOD RIGHT FOREARM  Result Value Ref Range Status   Specimen Description   Final    BLOOD RIGHT FOREARM Performed at [facility], [place] [address], [place], [place] [address]    Special Requests   Final    BOTTLES DRAWN AEROBIC AND ANAEROBIC Blood Culture adequate volume Performed at [facility] [address] [place], [place] [address]    Culture   Final    NO GROWTH 5 DAYS Performed at [facility], [place] [address] [place], [place] [address]    Report Status 11/19/2022 FINAL  Final  Blood culture (routine x 2)     Status: None   Collection Time: 11/14/22  7:39 PM   Specimen: BLOOD  Result Value Ref Range Status   Specimen Description   Final    BLOOD LEFT ANTECUBITAL Performed at [facility] [address] [place], [place] [address]  Special Requests   Final    BOTTLES DRAWN AEROBIC ONLY Blood Culture adequate volume Performed at [facility] [address], [place], [place] [address]    Culture   Final    NO GROWTH 5 DAYS Performed at  [facility], [place] [address], [place], [place] [address]    Report Status 11/19/2022 FINAL  Final  Urine Culture     Status: Abnormal   Collection Time: 11/14/22 11:22 PM   Specimen: Urine, Catheterized  Result Value Ref Range Status   Specimen Description   Final    URINE, CATHETERIZED Performed at [facility] [address] [place], [place] [address]    Special Requests   Final    NONE Performed at [facility], [place] [address] [place], [place] [address]    Culture 10,000 COLONIES/mL ENTEROCOCCUS FAECALIS (A)  Final   Report Status 11/18/2022 FINAL  Final   Organism ID, Bacteria ENTEROCOCCUS FAECALIS (A)  Final      Susceptibility   Enterococcus faecalis - MIC*    AMPICILLIN <=2 SENSITIVE Sensitive     NITROFURANTOIN <=16 SENSITIVE Sensitive     VANCOMYCIN 1 SENSITIVE Sensitive     * 10,000 COLONIES/mL ENTEROCOCCUS FAECALIS  Respiratory (~20 pathogens) panel by PCR     Status: None   Collection Time: 11/15/22 10:35 AM   Specimen: Nasopharyngeal Swab; Respiratory  Result Value Ref Range Status   Adenovirus NOT DETECTED NOT DETECTED Final   Coronavirus 229E NOT DETECTED NOT DETECTED Final    Comment: (NOTE) The Coronavirus on the Respiratory Panel, DOES NOT test for the novel  Coronavirus (2019 nCoV)    Coronavirus HKU1 NOT DETECTED NOT DETECTED Final   Coronavirus NL63 NOT DETECTED NOT DETECTED Final   Coronavirus OC43 NOT DETECTED NOT DETECTED Final   Metapneumovirus NOT DETECTED NOT DETECTED Final   Rhinovirus / Enterovirus NOT DETECTED NOT DETECTED Final   Influenza A NOT DETECTED NOT DETECTED Final   Influenza B NOT DETECTED NOT DETECTED Final   Parainfluenza Virus 1 NOT DETECTED NOT DETECTED Final   Parainfluenza Virus 2 NOT DETECTED NOT DETECTED Final   Parainfluenza Virus 3 NOT DETECTED NOT DETECTED Final   Parainfluenza Virus 4 NOT DETECTED NOT DETECTED Final   Respiratory Syncytial Virus NOT DETECTED NOT DETECTED  Final   Bordetella pertussis NOT DETECTED NOT DETECTED Final   Bordetella Parapertussis NOT DETECTED NOT DETECTED Final   Chlamydophila pneumoniae NOT DETECTED NOT DETECTED Final   Mycoplasma pneumoniae NOT DETECTED NOT DETECTED Final    Comment: Performed at [facility], [place] [address] [place], [place] [address]  Urine Culture     Status: None   Collection Time: 11/17/22  1:02 PM   Specimen: Urine, Catheterized  Result Value Ref Range Status   Specimen Description   Final    URINE, CATHETERIZED Performed at [facility] [address] [place], [place] [address]    Special Requests   Final    NONE Performed at [facility], [place] [address], [place], [place] [address]    Culture   Final    NO GROWTH Performed at [facility], [place] [address], [place], [place] [address]    Report Status 11/18/2022 FINAL  Final    Radiology Studies: No results found.  Scheduled Meds:  [MAR Hold] alteplase  2 mg Intracatheter Once   [MAR Hold] alteplase  2 mg Intracatheter Once   [facility] Hold]  bisacodyl  10 mg Rectal Daily   [MAR Hold] Chlorhexidine Gluconate Cloth  6 each Topical Daily   [MAR Hold] docusate sodium  100 mg Oral BID   enoxaparin (LOVENOX) injection  40 mg Subcutaneous Once   feeding supplement  296 mL Oral Once   [MAR Hold] folic acid  1 mg Intravenous Daily   [MAR Hold] gabapentin  600 mg Oral TID   [MAR Hold] hydrocortisone cream   Topical BID   HYDROmorphone       [MAR Hold] lidocaine-EPINEPHrine  10 mL Intradermal Once   [MAR Hold] loratadine  10 mg Oral Daily   [MAR Hold] nystatin   Topical q12n4p   [MAR Hold] Ensure Max Protein  11 oz Oral BID   [MAR Hold] senna  1 tablet Oral BID   Continuous Infusions:  [MAR Hold] famotidine (PEPCID) IV 20 mg (11/21/22 2129)    LOS: 8 days   Raiford Noble, DO [facility] Available via Epic secure chat 7am-7pm After these hours, please refer to coverage provider listed on  [email or web address] 11/22/2022, 2:22 PM

## 2022-11-22 NOTE — Progress Notes (Signed)
PT Cancellation Note  Patient Details Name: Ellen Hill MRN: [id number] DOB: January 18, 1971   Cancelled Treatment:    Reason Eval/Treat Not Completed: Patient at procedure or test/unavailable (surgery)   [name] 11/22/2022, 8:57 AM Arlyce Dice, DPT Physical Therapist [facility] Preferred contact method: Secure Chat Weekend Pager Only: [phone] Office: [phone]

## 2022-11-22 NOTE — Consult Note (Addendum)
[facility] Nurse Consult Note: [facility] team requested to perform presurgical stoma site marking. Discussed via secure chat with surgical PA;  Pt plans to go to surgery at 0900. The [place] team has decreased staffing this morning; we are currently working at other campus locations and will unfortunately not be able to perform the marking. Our team will follow-up with the patient after surgery for ostomy teaching. Thank-you,  Julien Girt MSN, [place], [place], [place], [place]

## 2022-11-22 NOTE — Anesthesia Postprocedure Evaluation (Signed)
Anesthesia Post Note  Patient: Ellen Hill  Procedure(s) Performed: LAPAROSCOPIC RIGHT COLECTOMY WITH ANASTOMOSIS, SIGMOID RESECTION WITH COLOSTOMY, AND BIOPSY OF ABDOMINAL SKIN     Patient location during evaluation: PACU Anesthesia Type: General Level of consciousness: awake and alert, oriented and patient cooperative Pain management: pain level controlled Vital Signs Assessment: post-procedure vital signs reviewed and stable Respiratory status: spontaneous breathing, nonlabored ventilation and respiratory function stable Cardiovascular status: blood pressure returned to baseline and stable Postop Assessment: no apparent nausea or vomiting Anesthetic complications: no   No notable events documented.  Last Vitals:  Vitals:   11/22/22 0511 11/22/22 0840  BP: 128/72 118/63  Pulse: 78 82  Resp: 18 20  Temp: (!) 36.4 C 36.5 C  SpO2: 100% 100%    Last Pain:  Vitals:   11/22/22 0840  TempSrc: Oral  PainSc: 0-No pain                 Pervis Hocking

## 2022-11-23 ENCOUNTER — Encounter (HOSPITAL_COMMUNITY): Payer: Self-pay | Admitting: General Surgery

## 2022-11-23 DIAGNOSIS — R21 Rash and other nonspecific skin eruption: Secondary | ICD-10-CM | POA: Diagnosis not present

## 2022-11-23 DIAGNOSIS — D72823 Leukemoid reaction: Secondary | ICD-10-CM | POA: Diagnosis not present

## 2022-11-23 DIAGNOSIS — E876 Hypokalemia: Secondary | ICD-10-CM | POA: Diagnosis not present

## 2022-11-23 LAB — CBC
HCT: 30.8 % — ABNORMAL LOW (ref 36.0–46.0)
Hemoglobin: 9 g/dL — ABNORMAL LOW (ref 12.0–15.0)
MCH: 21.4 pg — ABNORMAL LOW (ref 26.0–34.0)
MCHC: 29.2 g/dL — ABNORMAL LOW (ref 30.0–36.0)
MCV: 73.3 fL — ABNORMAL LOW (ref 80.0–100.0)
Platelets: 156 10*3/uL (ref 150–400)
RBC: 4.2 MIL/uL (ref 3.87–5.11)
RDW: 33.2 % — ABNORMAL HIGH (ref 11.5–15.5)
WBC: 15.7 10*3/uL — ABNORMAL HIGH (ref 4.0–10.5)
nRBC: 0 % (ref 0.0–0.2)

## 2022-11-23 LAB — BASIC METABOLIC PANEL
Anion gap: 5 (ref 5–15)
BUN: 17 mg/dL (ref 6–20)
CO2: 24 mmol/L (ref 22–32)
Calcium: 7.7 mg/dL — ABNORMAL LOW (ref 8.9–10.3)
Chloride: 114 mmol/L — ABNORMAL HIGH (ref 98–111)
Creatinine, Ser: 0.69 mg/dL (ref 0.44–1.00)
GFR, Estimated: >60 mL/min (ref >60)
Glucose, Bld: 128 mg/dL — ABNORMAL HIGH (ref 70–99)
Potassium: 4.2 mmol/L (ref 3.5–5.1)
Sodium: 143 mmol/L (ref 135–145)

## 2022-11-23 NOTE — Progress Notes (Signed)
PROGRESS NOTE    [facility]  YME:[id number] DOB: 1971-03-27 DOA: 11/14/2022 PCP: [facility]   Brief Narrative:  The Patient is a 51 year old female history of paraplegia as a result of laminectomy, history of chronic anemia, morbid obesity, hypertension, constipation presented with a rash ongoing x 2 weeks which had developed on her anterior trunk and extremities and abdomen started on doxycycline and treated with steroids however no significant improvement.  Patient noted not to have any mucosal involvement or rash of the palms of the hands and the feet no involvement of the posterior body.  Patient also noted to have a history of drug-induced constipation.  Patient noted on 10/30/2022 to be started on Bentyl however reported rash started on her legs progressed up to 2 days after starting Bentyl and using steroids only made rash worse and spread with a burning and itching sensation.  Patient noted to be followed by GI for chronic anemia and was to undergo endoscopy in the OR unfortunately developed rash and unable to get this done.  Patient presented to the ED, noted to have a significant anemia with a hemoglobin of 6.8 and admitted.  Patient transfused 2 units packed red blood cells.  Posttransfusion CBC pending.  CT abdomen and pelvis done due to concern for ileus with circumferential irregular thickening of the ascending colon proximal to the hepatic flexure, small mesenteric lymph nodes findings concerning for colonic neoplasm, second potential site of thickening in the sigmoid also raising concern for colorectal neoplasm, significant fecal impaction, left ureteral calculus/calculi with mild fullness of the left ureter stable appearance of intra renal calculi on the left since previous imaging, signs of body wall edema.  GI consulted for further evaluation.  Patient for colonoscopy 11/19/2022.  ID also consulted for evaluation of rash.    Colonoscopy done after CT scan of the  abdomen pelvis shows circumferential irregular thickening of the ascending colon proximal to the hepatic flexure as well as small mesenteric lymph nodes and a second potential site of thickening in the sigmoid colon.  Colonoscopy was done which showed 2 masses at the hepatic flexure and sigmoid colon biopsy was obtained and results are still pending.  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  Oncology has discussed the role of adjuvant chemotherapy for stage III colon cancer and some high risk stage II disease and she does not have high risk disease she may be a oral chemo candidate with capecitabine due to her paraplegia she would not be a candidate for intravenous chemo.   Patient was taken to the OR on 11/22/22 for a laparoscopic right colectomy with ileocolonic anastomosis, laparoscopic sigmoid resection with end colostomy and skin biopsy and was seen in the PACU and was somnolent and drowsy after receiving anesthesia.  On 12/2 through ongoing today she is awake but she is in pain and appeared a little withdrawn.  Thinks her rash is improving.  Surgery has placed her on clear liquid diet  Assessment and Plan:  Diffuse Rash mildly improving -Patient noted to have developed a rash for the past 2 weeks which have worsened, rash with bullous changes and per ID concern for erythema multiforme with no severe sloughing or Nikolsky sign noted. -Patient with no mucosal involvement and no prior HSV infection. -HIV negative.  RPR nonreactive.  Respiratory viral PCR negative. -Mycoplasma IgM <770.  -  ID recommending general surgery evaluation for punch biopsy and will be done  -ID feels could be possibly related to Bentyl. -Also concern for possible paraneoplastic rash. -ID recommending calamine lotion for itching and will need outpatient follow-up with  dermatology. 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 -FOBT negative. -Hemoglobin noted at 6.8 on admission (11/14/2022). -Status posttransfusion 2 units packed red blood cells,  -Anemia panel with iron level of 12, ferritin of 3, folate of 4.6. -CT abdomen and pelvis with circumferential irregular thickening of the ascending colon proximal to the hepatic flexure with adjacent small mesenteric lymph nodes concerning for colonic neoplasm, potential second site in the sigmoid also raising concern for additional colorectal neoplasm of the mid sigmoid colon less likely colitis.  Fecal impaction also noted. -GI consulted for further evaluation and patient started on a bowel prep for colonoscopy.  Status post 2 to 3 days bowel prep which has been completed and patient underwent colonoscopy which showed an ulcerated nonobstructing medium-sized mass found in the rectosigmoid colon where the mass was noncircumferential and oozing was present.  This was biopsied with a cold forceps for histology.  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 Gurney Maxin; Follow up Pathology on Masses  -She is having a full workup and underwent a CT of the chest without contrast today which showed "No worrisome pulmonary lesions or pulmonary nodules to suggest metastatic disease. Enlarged bilateral axillary lymph nodes are indeterminate. Abnormal appearance of the sternal manubrium. Could not exclude a permeative neoplastic process. Recommend correlation any sternal pain or tenderness. MRI may be helpful for further evaluation. Postoperative changes involving the thoracic spine with previouslaminectomies and benign-appearing postoperative fluid collection in the subcutaneous fat." -Hemoglobin/Hematocrit has gone from 10.3/35.8 -> 9.2/31.5 -> 9.1/31.2 -> 8.5/29.7 -> 7.9/27.4 -> 8.5/25.0 -> 9.2/27.0 -> 9.0/30.8 -She had a central venous catheter for her surgery but will remove given that she still has a PICC line is functioning well -Follow H&H, transfusion threshold hemoglobin < 7. 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 -Oncology has discussed the role of adjuvant chemotherapy for stage III colon cancer and some high risk stage II disease and she does not have high risk disease she may be  a oral chemo candidate with capecitabine due to her paraplegia she would not be a candidate for intravenous chemo.   Hypokalemia -Secondary to GI losses.   -Potassium is now 4.2 -Magnesium was 2.5 yesterday  -Continue potassium repletion and  magnesium monitoring -Repeat CMP in the a.m.   Hypophosphatemia -Patient's Phos Level is now 2.1 yesterday but not checked this AM  -Replete with IV K Phos 15 mmol yesterday -Continue to Monitor and Replete as Necessary -Repeat CMP in the AM    Constipation -Likely drug-induced. -Disimpaction ordered initially on admission.. -Patient was placed on Dulcolax suppository and Senokot as also received a smog enema.   -Status post bowel prep for in anticipation of colonoscopy .  -Post colonoscopy will likely need to be placed on a bowel regimen. -GI was following and signed off    Hypertension -BP currently controlled off antihypertensive medications. -Continue to Monitor BP per Protocol -Last BP reading was 134/75   Super Morbid Obesity -Complicates overall prognosis and care -Estimated body mass index is 67.79 kg/m as calculated from the following:   Height as of this encounter: '5\' 6"'$  (1.676 m).   Weight as of this encounter: 190.5 kg.  -Weight Loss and Dietary Counseling given and will need continual Lifestyle modification -Outpatient follow-up with PCP.   Leukocytosis -With a worsening leukocytosis initially but trending back down.  As WBC has gone from 20.8 -> 25.8 -> 18.5 -> 18.0 -> 14.3 -> 10.4 -> 15.7 and likely reactive in the setting of Surgery  -Currently afebrile. -Repeat UA with trace leukocytes, negative nitrites, 11-20 WBCs.   -Urine cultures with 10,000 colonies of Enterococcus faecalis.   -Blood cultures pending with no growth to date. -Respiratory viral panel negative. -Chest x-ray with no acute abnormalities.. -Patient nontoxic-appearing. -Monitor off antibiotics at this time and repeat labs in the AM.   Thrombocytopenia -The patient's platelet count dropped from 358 -> 336 -> 203 -> 158 -> 149 -> 142 -> 163 -> 156 -Continue to monitor for signs and symptoms of bleeding; no overt bleeding noted -Repeat CBC in a.m.   Paraplegia -As a result of a  laminectomy -Continue supportive care    DVT prophylaxis: enoxaparin (LOVENOX) injection 40 mg Start: 11/23/22 0800 SCD's Start: 11/22/22 1741 Place TED hose Start: 11/22/22 1741 SCDs Start: 11/14/22 2005    Code Status: Full Code Family Communication: No family currently at bedside  Disposition Plan:  Level of care: Progressive Status is: Inpatient Remains inpatient appropriate because: His further clinical improvement and clearance by surgery.  Surgery is placed on a clear liquid diet.  She will need to be reevaluated by PT and OT.   Consultants:  Gastroenterology General Surgery Medical Oncology   Procedures:  As delineated as above  Patient underwent laparoscopic right colectomy with ileocolonic anastomosis, laparoscopic sigmoid resection with end colostomy and a skin biopsy   Antimicrobials:  Anti-infectives (From admission, onward)    Start     Dose/Rate Route Frequency Ordered Stop   11/22/22 0600  cefoTEtan (CEFOTAN) 2 g in sodium chloride 0.9 % 100 mL IVPB        2 g 200 mL/hr over 30 Minutes Intravenous On call to O.R. 11/21/22 1427 11/22/22 1014   11/21/22 2200  neomycin (MYCIFRADIN) tablet 1,000 mg  Status:  Discontinued       See Hyperspace for full Linked Orders Report.   1,000 mg Oral 3 times per day 11/21/22 1427 11/21/22 1733   11/21/22 2200  metroNIDAZOLE (FLAGYL) tablet 1,000  mg  Status:  Discontinued       See Hyperspace for full Linked Orders Report.   1,000 mg Oral 3 times per day 11/21/22 1427 11/21/22 1733   11/21/22 2200  metroNIDAZOLE (FLAGYL) tablet 1,000 mg       See Hyperspace for full Linked Orders Report.   1,000 mg Oral  Once 11/21/22 1731 11/21/22 2120   11/21/22 2200  neomycin (MYCIFRADIN) tablet 1,000 mg       See Hyperspace for full Linked Orders Report.   1,000 mg Oral  Once 11/21/22 1731 11/21/22 2231   11/21/22 1900  metroNIDAZOLE (FLAGYL) tablet 1,000 mg       See Hyperspace for full Linked Orders Report.   1,000 mg Oral  Once  11/21/22 1731 11/21/22 1918   11/21/22 1900  neomycin (MYCIFRADIN) tablet 1,000 mg       See Hyperspace for full Linked Orders Report.   1,000 mg Oral  Once 11/21/22 1731 11/21/22 1918   11/21/22 1800  metroNIDAZOLE (FLAGYL) tablet 1,000 mg       See Hyperspace for full Linked Orders Report.   1,000 mg Oral  Once 11/21/22 1731 11/21/22 1753   11/21/22 1800  neomycin (MYCIFRADIN) tablet 1,000 mg       See Hyperspace for full Linked Orders Report.   1,000 mg Oral  Once 11/21/22 1731 11/21/22 1753   11/14/22 1545  cefTRIAXone (ROCEPHIN) 1 g in sodium chloride 0.9 % 100 mL IVPB        1 g 200 mL/hr over 30 Minutes Intravenous  Once 11/14/22 1539 11/15/22 0700       Subjective: Seen and examined at bedside and she is withdrawn and was complaining about some pain.  No nausea or vomiting.  Rash is improving.  She is not itching is much.  Denies any other concerns or complaints this time but did complain of pain.  Objective: Vitals:   11/23/22 0500 11/23/22 0604 11/23/22 0936 11/23/22 1246  BP:  (!) 108/57 114/77 134/75  Pulse:  100 (!) 103 (!) 103  Resp:  '17 16 16  '$ Temp:  97.7 F (36.5 C) 98.3 F (36.8 C) 98.9 F (37.2 C)  TempSrc:  Oral Oral Oral  SpO2:  100% 100% 100%  Weight: (!) 190.5 kg     Height:        Intake/Output Summary (Last 24 hours) at 11/23/2022 1655 Last data filed at 11/23/2022 0600 Gross per 24 hour  Intake 200 ml  Output 850 ml  Net -650 ml   Filed Weights   11/14/22 1346 11/22/22 0840 11/23/22 0500  Weight: (!) 163.3 kg (!) 163.3 kg (!) 190.5 kg   Examination: Physical Exam:  Constitutional: WN/WD super morbidly obese African-American female who appears withdrawn today complaining of some pain Respiratory: Diminished to auscultation bilaterally with some coarse breath sounds, no wheezing, rales, rhonchi or crackles. Normal respiratory effort and patient is not tachypenic. No accessory muscle use.  Unlabored breathing Cardiovascular: RRR, no murmurs /  rubs / gallops. S1 and S2 auscultated.  Has some lower extremity edema Abdomen: Soft, tender to palpate, distended secondary to body habitus.  Has an ostomy now bowel sounds positive.  GU: Deferred. Musculoskeletal: No clubbing / cyanosis of digits/nails. No joint deformity upper and lower extremities.  Skin: Has a diffuse rash noted with some bullae on her skin and vesicles that are broken and other lesions that are healed or that she scratched but this is improving slowly. Could be bullous pemphigoid  but unclear until biopsy results Neurologic: CN 2-12 grossly intact with no focal deficits. Romberg sign and cerebellar reflexes not assessed.  Psychiatric: Normal judgment and insight. Alert and oriented x 3.  Slightly depressed appearing was withdrawn  Data Reviewed: I have personally reviewed following labs and imaging studies  CBC: Recent Labs  Lab 11/18/22 0250 11/19/22 0343 11/20/22 0407 11/21/22 0329 11/22/22 0455 11/22/22 1041 11/22/22 1311 11/23/22 0251  WBC 18.5* 18.0* 14.3* 10.4 11.8*  --   --  15.7*  NEUTROABS 12.6* 12.0* 9.7* 9.0* 9.4*  --   --   --   HGB 10.3* 9.2* 9.1* 8.5* 7.9* 8.5* 9.2* 9.0*  HCT 35.8* 31.5* 31.2* 29.7* 27.4* 25.0* 27.0* 30.8*  MCV 67.8* 68.2* 68.7* 70.0* 70.8*  --   --  73.3*  PLT 203 158 149* 142* 163  --   --  818   Basic Metabolic Panel: Recent Labs  Lab 11/18/22 0250 11/19/22 0343 11/20/22 0407 11/21/22 0329 11/22/22 0455 11/22/22 1041 11/22/22 1311 11/23/22 0251  NA 138 134* 139 142 141 144 145 143  K 3.1* 2.8* 3.5 4.0 3.7 3.6 3.5 4.2  CL 107 104 110 112* 112*  --   --  114*  CO2 '22 23 23 23 24  '$ --   --  24  GLUCOSE 114* 110* 92 160* 143*  --   --  128*  BUN '14 11 10 10 12  '$ --   --  17  CREATININE 0.62 0.73 0.61 0.72 0.52  --   --  0.69  CALCIUM 8.2* 7.8* 7.8* 8.1* 8.1*  --   --  7.7*  MG 2.2 2.1 2.2 2.2 2.5*  --   --   --   PHOS  --   --   --  2.1* 2.1*  --   --   --    GFR: Estimated Creatinine Clearance: 150.1 mL/min (by C-G  formula based on SCr of 0.69 mg/dL). Liver Function Tests: Recent Labs  Lab 11/21/22 0329 11/22/22 0455  AST 11* 14*  ALT 11 12  ALKPHOS 52 46  BILITOT 0.6 0.4  PROT 6.0* 6.2*  ALBUMIN 2.8* 3.0*   No results for input(s): "LIPASE", "AMYLASE" in the last 168 hours. No results for input(s): "AMMONIA" in the last 168 hours. Coagulation Profile: No results for input(s): "INR", "PROTIME" in the last 168 hours. Cardiac Enzymes: No results for input(s): "CKTOTAL", "CKMB", "CKMBINDEX", "TROPONINI" in the last 168 hours. BNP (last 3 results) No results for input(s): "PROBNP" in the last 8760 hours. HbA1C: No results for input(s): "HGBA1C" in the last 72 hours. CBG: No results for input(s): "GLUCAP" in the last 168 hours. Lipid Profile: No results for input(s): "CHOL", "HDL", "LDLCALC", "TRIG", "CHOLHDL", "LDLDIRECT" in the last 72 hours. Thyroid Function Tests: No results for input(s): "TSH", "T4TOTAL", "FREET4", "T3FREE", "THYROIDAB" in the last 72 hours. Anemia Panel: No results for input(s): "VITAMINB12", "FOLATE", "FERRITIN", "TIBC", "IRON", "RETICCTPCT" in the last 72 hours. Sepsis Labs: No results for input(s): "PROCALCITON", "LATICACIDVEN" in the last 168 hours.  Recent Results (from the past 240 hour(s))  Blood culture (routine x 2)     Status: None   Collection Time: 11/14/22  5:30 PM   Specimen: BLOOD RIGHT FOREARM  Result Value Ref Range Status   Specimen Description   Final    BLOOD RIGHT FOREARM Performed at [facility], [place] [address] [place], [place] [address]    Special Requests   Final    BOTTLES DRAWN  AEROBIC AND ANAEROBIC Blood Culture adequate volume Performed at [facility] [address], [place], [place] [address]    Culture   Final    NO GROWTH 5 DAYS Performed at [facility], [place] [address], [place], [place] [address]    Report Status 11/19/2022 FINAL  Final  Blood culture (routine x 2)     Status: None    Collection Time: 11/14/22  7:39 PM   Specimen: BLOOD  Result Value Ref Range Status   Specimen Description   Final    BLOOD LEFT ANTECUBITAL Performed at [facility] [address], [place], [place] [address]    Special Requests   Final    BOTTLES DRAWN AEROBIC ONLY Blood Culture adequate volume Performed at [facility] [address] [place], [place] [address]    Culture   Final    NO GROWTH 5 DAYS Performed at [facility], [place] [address], [place], [place] [address]    Report Status 11/19/2022 FINAL  Final  Urine Culture     Status: Abnormal   Collection Time: 11/14/22 11:22 PM   Specimen: Urine, Catheterized  Result Value Ref Range Status   Specimen Description   Final    URINE, CATHETERIZED Performed at [facility] [address] [place], [place] [address]    Special Requests   Final    NONE Performed at [facility], [place] [address], [place], [place] [address]    Culture 10,000 COLONIES/mL ENTEROCOCCUS FAECALIS (A)  Final   Report Status 11/18/2022 FINAL  Final   Organism ID, Bacteria ENTEROCOCCUS FAECALIS (A)  Final      Susceptibility   Enterococcus faecalis - MIC*    AMPICILLIN <=2 SENSITIVE Sensitive     NITROFURANTOIN <=16 SENSITIVE Sensitive     VANCOMYCIN 1 SENSITIVE Sensitive     * 10,000 COLONIES/mL ENTEROCOCCUS FAECALIS  Respiratory (~20 pathogens) panel by PCR     Status: None   Collection Time: 11/15/22 10:35 AM   Specimen: Nasopharyngeal Swab; Respiratory  Result Value Ref Range Status   Adenovirus NOT DETECTED NOT DETECTED Final   Coronavirus 229E NOT DETECTED NOT DETECTED Final    Comment: (NOTE) The Coronavirus on the Respiratory Panel, DOES NOT test for the novel  Coronavirus (2019 nCoV)    Coronavirus HKU1 NOT DETECTED NOT DETECTED Final   Coronavirus NL63 NOT DETECTED NOT DETECTED Final   Coronavirus OC43 NOT DETECTED NOT DETECTED Final   Metapneumovirus  NOT DETECTED NOT DETECTED Final   Rhinovirus / Enterovirus NOT DETECTED NOT DETECTED Final   Influenza A NOT DETECTED NOT DETECTED Final   Influenza B NOT DETECTED NOT DETECTED Final   Parainfluenza Virus 1 NOT DETECTED NOT DETECTED Final   Parainfluenza Virus 2 NOT DETECTED NOT DETECTED Final   Parainfluenza Virus 3 NOT DETECTED NOT DETECTED Final   Parainfluenza Virus 4 NOT DETECTED NOT DETECTED Final   Respiratory Syncytial Virus NOT DETECTED NOT DETECTED Final   Bordetella pertussis NOT DETECTED NOT DETECTED Final   Bordetella Parapertussis NOT DETECTED NOT DETECTED Final   Chlamydophila pneumoniae NOT DETECTED NOT DETECTED Final   Mycoplasma pneumoniae NOT DETECTED NOT DETECTED Final    Comment: Performed at [facility], [place] [address], [place],  [address]  Urine Culture     Status: None   Collection Time: 11/17/22  1:02 PM   Specimen: Urine, Catheterized  Result Value Ref Range Status   Specimen  Description   Final    URINE, CATHETERIZED Performed at [facility], [place] [address], [place], [place] [address]    Special Requests   Final    NONE Performed at [facility], [place] [address], [place], [place] [address]    Culture   Final    NO GROWTH Performed at [facility], [place] [address] [place], [place] [address]    Report Status 11/18/2022 FINAL  Final    Radiology Studies: No results found.  Scheduled Meds:  alteplase  2 mg Intracatheter Once   alteplase  2 mg Intracatheter Once   Chlorhexidine Gluconate Cloth  6 each Topical Daily   enoxaparin (LOVENOX) injection  40 mg Subcutaneous Q24H   feeding supplement  237 mL Oral BID BM   folic acid  1 mg Intravenous Daily   gabapentin  600 mg Oral TID   hydrocortisone cream   Topical BID   lidocaine-EPINEPHrine  10 mL Intradermal Once   loratadine  10 mg Oral Daily   nystatin   Topical q12n4p   Ensure Max Protein  11 oz Oral BID   Continuous Infusions:   famotidine (PEPCID) IV 20 mg (11/23/22 1302)    LOS: 9 days   Raiford Noble, DO [facility] Available via Epic secure chat 7am-7pm After these hours, please refer to coverage provider listed on [email or web address] 11/23/2022, 4:55 PM

## 2022-11-23 NOTE — Progress Notes (Signed)
1 Day Post-Op   Subjective/Chief Complaint: comfortable   Objective: Vital signs in last 24 hours: Temp:  [96.6 F (35.9 C)-97.7 F (36.5 C)] 97.7 F (36.5 C) (12/02 0604) Pulse Rate:  [80-101] 100 (12/02 0604) Resp:  [14-21] 17 (12/02 0604) BP: (108-123)/(57-73) 108/57 (12/02 0604) SpO2:  [97 %-100 %] 100 % (12/02 0604) Arterial Line BP: (106-121)/(51-63) 118/55 (12/01 1530) Weight:  [163.3 kg-190.5 kg] 190.5 kg (12/02 0500) Last BM Date : 11/19/22  Intake/Output from previous day: 12/01 0701 - 12/02 0700 In: 2215 [P.O.:200; I.V.:1700; Blood:315] Out: 2585 [Urine:1035; Stool:25; Blood:155] Intake/Output this shift: No intake/output data recorded.  Exam: Awake and alert Looks comfortable Abdomen soft, ostomy pink/viable  Lab Results:  Recent Labs    11/22/22 0455 11/22/22 1041 11/22/22 1311 11/23/22 0251  WBC 11.8*  --   --  15.7*  HGB 7.9*   < > 9.2* 9.0*  HCT 27.4*   < > 27.0* 30.8*  PLT 163  --   --  156   < > = values in this interval not displayed.   BMET Recent Labs    11/22/22 0455 11/22/22 1041 11/22/22 1311 11/23/22 0251  NA 141   < > 145 143  K 3.7   < > 3.5 4.2  CL 112*  --   --  114*  CO2 24  --   --  24  GLUCOSE 143*  --   --  128*  BUN 12  --   --  17  CREATININE 0.52  --   --  0.69  CALCIUM 8.1*  --   --  7.7*   < > = values in this interval not displayed.   PT/INR No results for input(s): "LABPROT", "INR" in the last 72 hours. ABG Recent Labs    11/22/22 1041 11/22/22 1311  PHART 7.389 7.341*  HCO3 21.8 20.4    Studies/Results: No results found.  Anti-infectives: Anti-infectives (From admission, onward)    Start     Dose/Rate Route Frequency Ordered Stop   11/22/22 0600  cefoTEtan (CEFOTAN) 2 g in sodium chloride 0.9 % 100 mL IVPB        2 g 200 mL/hr over 30 Minutes Intravenous On call to O.R. 11/21/22 1427 11/22/22 1014   11/21/22 2200  neomycin (MYCIFRADIN) tablet 1,000 mg  Status:  Discontinued       See  Hyperspace for full Linked Orders Report.   1,000 mg Oral 3 times per day 11/21/22 1427 11/21/22 1733   11/21/22 2200  metroNIDAZOLE (FLAGYL) tablet 1,000 mg  Status:  Discontinued       See Hyperspace for full Linked Orders Report.   1,000 mg Oral 3 times per day 11/21/22 1427 11/21/22 1733   11/21/22 2200  metroNIDAZOLE (FLAGYL) tablet 1,000 mg       See Hyperspace for full Linked Orders Report.   1,000 mg Oral  Once 11/21/22 1731 11/21/22 2120   11/21/22 2200  neomycin (MYCIFRADIN) tablet 1,000 mg       See Hyperspace for full Linked Orders Report.   1,000 mg Oral  Once 11/21/22 1731 11/21/22 2231   11/21/22 1900  metroNIDAZOLE (FLAGYL) tablet 1,000 mg       See Hyperspace for full Linked Orders Report.   1,000 mg Oral  Once 11/21/22 1731 11/21/22 1918   11/21/22 1900  neomycin (MYCIFRADIN) tablet 1,000 mg       See Hyperspace for full Linked Orders Report.   1,000 mg Oral  Once  11/21/22 1731 11/21/22 1918   11/21/22 1800  metroNIDAZOLE (FLAGYL) tablet 1,000 mg       See Hyperspace for full Linked Orders Report.   1,000 mg Oral  Once 11/21/22 1731 11/21/22 1753   11/21/22 1800  neomycin (MYCIFRADIN) tablet 1,000 mg       See Hyperspace for full Linked Orders Report.   1,000 mg Oral  Once 11/21/22 1731 11/21/22 1753   11/14/22 1545  cefTRIAXone (ROCEPHIN) 1 g in sodium chloride 0.9 % 100 mL IVPB        1 g 200 mL/hr over 30 Minutes Intravenous  Once 11/14/22 1539 11/15/22 0700       Assessment/Plan: s/p Procedure(s): LAPAROSCOPIC RIGHT COLECTOMY WITH ANASTOMOSIS, SIGMOID RESECTION WITH COLOSTOMY, AND BIOPSY OF ABDOMINAL SKIN (N/A)  POD#1  Hgb stable On clear liquid diet Ok to get out of bed/ambulate    Coralie Keens MD 11/23/2022

## 2022-11-24 DIAGNOSIS — E876 Hypokalemia: Secondary | ICD-10-CM | POA: Diagnosis not present

## 2022-11-24 DIAGNOSIS — D72823 Leukemoid reaction: Secondary | ICD-10-CM | POA: Diagnosis not present

## 2022-11-24 DIAGNOSIS — R21 Rash and other nonspecific skin eruption: Secondary | ICD-10-CM | POA: Diagnosis not present

## 2022-11-24 LAB — CBC WITH DIFFERENTIAL/PLATELET
Abs Immature Granulocytes: 0.11 10*3/uL — ABNORMAL HIGH (ref 0.00–0.07)
Basophils Absolute: 0 10*3/uL (ref 0.0–0.1)
Basophils Relative: 0 %
Eosinophils Absolute: 0.3 10*3/uL (ref 0.0–0.5)
Eosinophils Relative: 2 %
HCT: 28 % — ABNORMAL LOW (ref 36.0–46.0)
Hemoglobin: 8.1 g/dL — ABNORMAL LOW (ref 12.0–15.0)
Immature Granulocytes: 1 %
Lymphocytes Relative: 6 %
Lymphs Abs: 1 10*3/uL (ref 0.7–4.0)
MCH: 21.7 pg — ABNORMAL LOW (ref 26.0–34.0)
MCHC: 28.9 g/dL — ABNORMAL LOW (ref 30.0–36.0)
MCV: 74.9 fL — ABNORMAL LOW (ref 80.0–100.0)
Monocytes Absolute: 0.7 10*3/uL (ref 0.1–1.0)
Monocytes Relative: 4 %
Neutro Abs: 14.8 10*3/uL — ABNORMAL HIGH (ref 1.7–7.7)
Neutrophils Relative %: 87 %
Platelets: 151 10*3/uL (ref 150–400)
RBC: 3.74 MIL/uL — ABNORMAL LOW (ref 3.87–5.11)
RDW: 34.2 % — ABNORMAL HIGH (ref 11.5–15.5)
WBC: 16.8 10*3/uL — ABNORMAL HIGH (ref 4.0–10.5)
nRBC: 0 % (ref 0.0–0.2)

## 2022-11-24 LAB — COMPREHENSIVE METABOLIC PANEL
ALT: 19 U/L (ref 0–44)
AST: 19 U/L (ref 15–41)
Albumin: 2.6 g/dL — ABNORMAL LOW (ref 3.5–5.0)
Alkaline Phosphatase: 44 U/L (ref 38–126)
Anion gap: 7 (ref 5–15)
BUN: 13 mg/dL (ref 6–20)
CO2: 25 mmol/L (ref 22–32)
Calcium: 7.8 mg/dL — ABNORMAL LOW (ref 8.9–10.3)
Chloride: 113 mmol/L — ABNORMAL HIGH (ref 98–111)
Creatinine, Ser: 0.62 mg/dL (ref 0.44–1.00)
GFR, Estimated: >60 mL/min (ref >60)
Glucose, Bld: 123 mg/dL — ABNORMAL HIGH (ref 70–99)
Potassium: 3.5 mmol/L (ref 3.5–5.1)
Sodium: 145 mmol/L (ref 135–145)
Total Bilirubin: 0.6 mg/dL (ref 0.3–1.2)
Total Protein: 5.8 g/dL — ABNORMAL LOW (ref 6.5–8.1)

## 2022-11-24 LAB — PHOSPHORUS: Phosphorus: 2.7 mg/dL (ref 2.5–4.6)

## 2022-11-24 LAB — MAGNESIUM: Magnesium: 2.2 mg/dL (ref 1.7–2.4)

## 2022-11-24 MED ORDER — POTASSIUM CHLORIDE 10 MEQ/100ML IV SOLN
10.0000 meq | INTRAVENOUS | Status: AC
  Administered 2022-11-24 (×4): 10 meq via INTRAVENOUS
  Filled 2022-11-24 (×4): qty 100

## 2022-11-24 MED ORDER — POTASSIUM CHLORIDE CRYS ER 20 MEQ PO TBCR: 40.0000 meq | EXTENDED_RELEASE_TABLET | Freq: Two times a day (BID) | ORAL | Status: DC

## 2022-11-24 NOTE — Progress Notes (Signed)
2 Days Post-Op   Subjective/Chief Complaint: Complains of incisional pain, bloating, some nausea   Objective: Vital signs in last 24 hours: Temp:  [98.3 F (36.8 C)-99.1 F (37.3 C)] 98.6 F (37 C) (12/03 0458) Pulse Rate:  [100-106] 101 (12/03 0458) Resp:  [16-20] 18 (12/03 0458) BP: (114-151)/(75-86) 151/86 (12/03 0458) SpO2:  [98 %-100 %] 98 % (12/03 0458) Weight:  [189.7 kg] 189.7 kg (12/03 0500) Last BM Date : 11/24/22  Intake/Output from previous day: 12/02 0701 - 12/03 0700 In: 270 [P.O.:120; IV Piggyback:150] Out: 800 [Urine:800] Intake/Output this shift: No intake/output data recorded.  Exam: Awake and alert Abdomen morbidly obese, soft, incision clean, nothing in ostomy bag  Lab Results:  Recent Labs    11/23/22 0251 11/24/22 0036  WBC 15.7* 16.8*  HGB 9.0* 8.1*  HCT 30.8* 28.0*  PLT 156 151   BMET Recent Labs    11/23/22 0251 11/24/22 0036  NA 143 145  K 4.2 3.5  CL 114* 113*  CO2 24 25  GLUCOSE 128* 123*  BUN 17 13  CREATININE 0.69 0.62  CALCIUM 7.7* 7.8*   PT/INR No results for input(s): "LABPROT", "INR" in the last 72 hours. ABG Recent Labs    11/22/22 1041 11/22/22 1311  PHART 7.389 7.341*  HCO3 21.8 20.4    Studies/Results: No results found.  Anti-infectives: Anti-infectives (From admission, onward)    Start     Dose/Rate Route Frequency Ordered Stop   11/22/22 0600  cefoTEtan (CEFOTAN) 2 g in sodium chloride 0.9 % 100 mL IVPB        2 g 200 mL/hr over 30 Minutes Intravenous On call to O.R. 11/21/22 1427 11/22/22 1014   11/21/22 2200  neomycin (MYCIFRADIN) tablet 1,000 mg  Status:  Discontinued       See Hyperspace for full Linked Orders Report.   1,000 mg Oral 3 times per day 11/21/22 1427 11/21/22 1733   11/21/22 2200  metroNIDAZOLE (FLAGYL) tablet 1,000 mg  Status:  Discontinued       See Hyperspace for full Linked Orders Report.   1,000 mg Oral 3 times per day 11/21/22 1427 11/21/22 1733   11/21/22 2200   metroNIDAZOLE (FLAGYL) tablet 1,000 mg       See Hyperspace for full Linked Orders Report.   1,000 mg Oral  Once 11/21/22 1731 11/21/22 2120   11/21/22 2200  neomycin (MYCIFRADIN) tablet 1,000 mg       See Hyperspace for full Linked Orders Report.   1,000 mg Oral  Once 11/21/22 1731 11/21/22 2231   11/21/22 1900  metroNIDAZOLE (FLAGYL) tablet 1,000 mg       See Hyperspace for full Linked Orders Report.   1,000 mg Oral  Once 11/21/22 1731 11/21/22 1918   11/21/22 1900  neomycin (MYCIFRADIN) tablet 1,000 mg       See Hyperspace for full Linked Orders Report.   1,000 mg Oral  Once 11/21/22 1731 11/21/22 1918   11/21/22 1800  metroNIDAZOLE (FLAGYL) tablet 1,000 mg       See Hyperspace for full Linked Orders Report.   1,000 mg Oral  Once 11/21/22 1731 11/21/22 1753   11/21/22 1800  neomycin (MYCIFRADIN) tablet 1,000 mg       See Hyperspace for full Linked Orders Report.   1,000 mg Oral  Once 11/21/22 1731 11/21/22 1753   11/14/22 1545  cefTRIAXone (ROCEPHIN) 1 g in sodium chloride 0.9 % 100 mL IVPB        1 g  200 mL/hr over 30 Minutes Intravenous  Once 11/14/22 1539 11/15/22 0700       Assessment/Plan: s/p Procedure(s): LAPAROSCOPIC RIGHT COLECTOMY WITH ANASTOMOSIS, SIGMOID RESECTION WITH COLOSTOMY, AND BIOPSY OF ABDOMINAL SKIN (N/A)  Hgb down a little.  Repeat in the morning Keep on clears unless emesis develops Try to get out of bed   LOS: 10 days    Coralie Keens 11/24/2022

## 2022-11-24 NOTE — Progress Notes (Signed)
PROGRESS NOTE    [facility]  OMV:[id number] DOB: 1971-10-29 DOA: 11/14/2022 PCP: [facility]   Brief Narrative:  The Patient is a 51 year old female history of paraplegia as a result of laminectomy, history of chronic anemia, morbid obesity, hypertension, constipation presented with a rash ongoing x 2 weeks which had developed on her anterior trunk and extremities and abdomen started on doxycycline and treated with steroids however no significant improvement.  Patient noted not to have any mucosal involvement or rash of the palms of the hands and the feet no involvement of the posterior body.  Patient also noted to have a history of drug-induced constipation.  Patient noted on 10/30/2022 to be started on Bentyl however reported rash started on her legs progressed up to 2 days after starting Bentyl and using steroids only made rash worse and spread with a burning and itching sensation.  Patient noted to be followed by GI for chronic anemia and was to undergo endoscopy in the OR unfortunately developed rash and unable to get this done.  Patient presented to the ED, noted to have a significant anemia with a hemoglobin of 6.8 and admitted.  Patient transfused 2 units packed red blood cells.  Posttransfusion CBC pending.  CT abdomen and pelvis done due to concern for ileus with circumferential irregular thickening of the ascending colon proximal to the hepatic flexure, small mesenteric lymph nodes findings concerning for colonic neoplasm, second potential site of thickening in the sigmoid also raising concern for colorectal neoplasm, significant fecal impaction, left ureteral calculus/calculi with mild fullness of the left ureter stable appearance of intra renal calculi on the left since previous imaging, signs of body wall edema.  GI consulted for further evaluation.  Patient for colonoscopy 11/19/2022.  ID also consulted for evaluation of rash.    Colonoscopy done after CT scan of the  abdomen pelvis shows circumferential irregular thickening of the ascending colon proximal to the hepatic flexure as well as small mesenteric lymph nodes and a second potential site of thickening in the sigmoid colon.  Colonoscopy was done which showed 2 masses at the hepatic flexure and sigmoid colon biopsy was obtained and results are still pending.  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  Oncology has discussed the role of adjuvant chemotherapy for stage III colon cancer and some high risk stage II disease and she does not have high risk disease she may be a oral chemo candidate with capecitabine due to her paraplegia she would not be a candidate for intravenous chemo.   Patient was taken to the OR on 11/22/22 for a laparoscopic right colectomy with ileocolonic anastomosis, laparoscopic sigmoid resection with end colostomy and skin biopsy and was seen in the PACU and was somnolent and drowsy after receiving anesthesia.   On 12/2 through ongoing today she is awake but she is in pain and appeared a little withdrawn.  Thinks her rash is improving.  Surgery has placed her on clear liquid diet  She is doing okay and tolerating clears for now but surgery wants her to try and get up and out of bed.  Still complain about some significant pain but ostomy is working.   Assessment and Plan:  Diffuse Rash improving -Patient noted to have developed a rash for the past 2 weeks which have worsened, rash with bullous changes and per ID concern  for erythema multiforme with no severe sloughing or Nikolsky sign noted. -Patient with no mucosal involvement and no prior HSV infection. -HIV negative.  RPR nonreactive.  Respiratory viral PCR negative. -Mycoplasma IgM <770.  -ID recommending general surgery evaluation for punch biopsy and will be done  -ID feels  could be possibly related to Bentyl. -Also concern for possible paraneoplastic rash. -ID recommending calamine lotion for itching and will need outpatient follow-up with dermatology. 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 -FOBT negative. -Hemoglobin noted at 6.8 on admission (11/14/2022). -Status posttransfusion 2 units packed red blood cells,  -Anemia panel with iron level of 12, ferritin of 3, folate of 4.6. -CT abdomen and pelvis with circumferential irregular thickening of the ascending colon proximal to the hepatic flexure with adjacent small mesenteric lymph nodes concerning for colonic neoplasm, potential second site in the sigmoid also raising concern for additional colorectal neoplasm of the mid sigmoid colon less likely colitis.  Fecal impaction also noted. -GI consulted for further evaluation and patient started on a bowel prep for colonoscopy.  Status post 2 to 3 days bowel prep which has been completed and patient underwent colonoscopy which showed an ulcerated nonobstructing medium-sized mass found in the rectosigmoid colon where the mass was noncircumferential and oozing was present.  This was biopsied with a cold forceps for histology.  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 Gurney Maxin; Follow up Pathology on Masses  -She is having a full workup and underwent a CT of the chest without contrast today which showed "No worrisome pulmonary lesions or pulmonary nodules to suggest metastatic disease. Enlarged bilateral axillary lymph nodes are indeterminate. Abnormal appearance of the sternal manubrium. Could not exclude a permeative neoplastic process. Recommend correlation any sternal pain or tenderness. MRI may be helpful for further evaluation. Postoperative changes involving the thoracic spine with previouslaminectomies and benign-appearing postoperative fluid collection in the subcutaneous fat." -Hemoglobin/Hematocrit has gone from 10.3/35.8 -> 9.2/31.5 -> 9.1/31.2 -> 8.5/29.7 -> 7.9/27.4 -> 8.5/25.0 -> 9.2/27.0 -> 9.0/30.8 -> 8.1/28.0 -She had a central venous catheter for her surgery but will remove given that she still has a PICC line is functioning well -Follow H&H, transfusion threshold hemoglobin < 7. 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  -Oncology has discussed the role of adjuvant chemotherapy for stage III colon cancer and some high risk stage II disease and she does not have high risk disease she may be a oral chemo candidate with capecitabine due to her  paraplegia she would not be a candidate for intravenous chemo. -Surgery recommending continuing to keep her on clears unless emesis develops and recommended trying to get her up and out of bed   Hypokalemia -Secondary to GI losses.   -Potassium is now 3.5 -Magnesium was 2.2 -Continue potassium repletion and magnesium monitoring -Repeat CMP in the a.m.   Hypophosphatemia -Patient's Phos Level is now 2.7 -Continue to Monitor and Replete as Necessary -Repeat CMP in the AM    Constipation -Likely drug-induced. -Disimpaction ordered initially on admission.. -Patient was placed on Dulcolax suppository and Senokot as also received a smog enema.   -Status post bowel prep for in anticipation of colonoscopy .  -Post colonoscopy will likely need to be placed on a bowel regimen. -GI was following and signed off and she now has an OSTOMY   Hypertension -BP currently controlled off antihypertensive medications. -Continue to Monitor BP per Protocol -Last BP reading was 147/82   Super Morbid Obesity -Complicates overall prognosis and care -Estimated body mass index is 67.5 kg/m as calculated from the following:   Height as of this encounter: '5\' 6"'$  (1.676 m).   Weight as of this encounter: 189.7 kg.  -Weight Loss and Dietary Counseling given and will need continual Lifestyle modification -Outpatient follow-up with PCP.   Leukocytosis -With a worsening leukocytosis initially but trending back down.  As WBC has gone from 20.8 -> 25.8 -> 18.5 -> 18.0 -> 14.3 -> 10.4 -> 15.7 -> 16.8 and likely reactive in the setting of Surgery and Steroid Demargination  -Currently afebrile. -Repeat UA with trace leukocytes, negative nitrites, 11-20 WBCs.   -Urine cultures with 10,000 colonies of Enterococcus faecalis.   -Blood cultures pending with no growth to date.  -Respiratory viral panel negative. -Chest x-ray with no acute abnormalities.. -Patient nontoxic-appearing. -Monitor off antibiotics at this  time and repeat labs in the AM.   Thrombocytopenia -The patient's platelet count dropped from 358 -> 336 -> 203 -> 158 -> 149 -> 142 -> 163 -> 156 -> 151 -Continue to monitor for signs and symptoms of bleeding; no overt bleeding noted -Repeat CBC in a.m.   Paraplegia -As a result of a laminectomy -Continue supportive care   DVT prophylaxis: enoxaparin (LOVENOX) injection 40 mg Start: 11/23/22 0800 SCD's Start: 11/22/22 1741 Place TED hose Start: 11/22/22 1741 SCDs Start: 11/14/22 2005    Code Status: Full Code Family Communication: Discussed with sister at bedside  Disposition Plan:  Level of care: Progressive Status is: Inpatient Remains inpatient appropriate because: Pending further clinical improvement and she will need PT and OT reevaluation   Consultants:  Gastroenterology General Surgery Medical Oncology   Procedures:  As delineated as above  Patient underwent laparoscopic right colectomy with ileocolonic anastomosis, laparoscopic sigmoid resection with end colostomy and a skin biopsy   Antimicrobials:  Anti-infectives (From admission, onward)    Start     Dose/Rate Route Frequency Ordered Stop   11/22/22 0600  cefoTEtan (CEFOTAN) 2 g in sodium chloride 0.9 % 100 mL IVPB        2 g 200 mL/hr over 30 Minutes Intravenous On call to O.R. 11/21/22 1427 11/22/22 1014   11/21/22 2200  neomycin (MYCIFRADIN) tablet 1,000 mg  Status:  Discontinued       See Hyperspace for full Linked Orders Report.   1,000 mg Oral 3 times per day 11/21/22 1427 11/21/22 1733   11/21/22 2200  metroNIDAZOLE (FLAGYL) tablet 1,000 mg  Status:  Discontinued       See Hyperspace for full Linked Orders Report.   1,000 mg Oral 3 times per day 11/21/22 1427 11/21/22 1733   11/21/22 2200  metroNIDAZOLE (FLAGYL) tablet 1,000 mg       See Hyperspace for full Linked Orders Report.   1,000 mg Oral  Once 11/21/22 1731 11/21/22 2120   11/21/22 2200  neomycin (MYCIFRADIN) tablet 1,000 mg       See  Hyperspace for full Linked Orders Report.   1,000 mg Oral  Once 11/21/22 1731 11/21/22 2231   11/21/22 1900  metroNIDAZOLE (FLAGYL) tablet 1,000 mg       See Hyperspace for full Linked Orders Report.   1,000 mg Oral  Once 11/21/22 1731 11/21/22 1918   11/21/22 1900  neomycin (MYCIFRADIN) tablet 1,000 mg       See Hyperspace for full Linked Orders Report.   1,000 mg Oral  Once 11/21/22 1731 11/21/22 1918   11/21/22 1800  metroNIDAZOLE (FLAGYL) tablet 1,000 mg       See Hyperspace for full Linked Orders Report.   1,000 mg Oral  Once 11/21/22 1731 11/21/22 1753   11/21/22 1800  neomycin (MYCIFRADIN) tablet 1,000 mg       See Hyperspace for full Linked Orders Report.   1,000 mg Oral  Once 11/21/22 1731 11/21/22 1753   11/14/22 1545  cefTRIAXone (ROCEPHIN) 1 g in sodium chloride 0.9 % 100 mL IVPB        1 g 200 mL/hr over 30 Minutes Intravenous  Once 11/14/22 1539 11/15/22 0700       Subjective: Seen and examined at bedside and she is much more awake and alert and complain of some pain.  Denies any nausea or vomiting.  Thinks her rash is improved and she is not itching at all now.  Denies any lightheadedness or dizziness and denies any other concerns or complaints at this time but continues to have some pain.  Ostomy is working fairly well.  Asking about the [place] nurse  Objective: Vitals:   11/24/22 0500 11/24/22 1153 11/24/22 1229 11/24/22 1314  BP:  (!) 147/82    Pulse:  (!) 102    Resp:  '20 20 19  '$ Temp:  98.9 F (37.2 C)    TempSrc:  Oral    SpO2:  99%    Weight: (!) 189.7 kg     Height:        Intake/Output Summary (Last 24 hours) at 11/24/2022 1642 Last data filed at 11/24/2022 2706 Gross per 24 hour  Intake 270 ml  Output 800 ml  Net -530 ml   Filed Weights   11/22/22 0840 11/23/22 0500 11/24/22 0500  Weight: (!) 163.3 kg (!) 190.5 kg (!) 189.7 kg   Examination: Physical Exam:  Constitutional: WN/WD morbidly obese African-American female who is more awake and alert  and complain of some abdominal pain Respiratory: Diminished to auscultation bilaterally with coarse breath sounds, no wheezing, rales, rhonchi or crackles. Normal respiratory effort and patient is not tachypenic. No accessory muscle use.  Unlabored breathing Cardiovascular: RRR, no murmurs / rubs / gallops. S1 and S2 auscultated. Abdomen: Soft, tender to palpate, distended secondary to body habitus.  Has a midline honeycomb dressing and an  ostomy noted that is pink and has some air and some slight output.  Bowel sounds positive.  GU: Deferred. Musculoskeletal: No clubbing / cyanosis of digits/nails. No joint deformity upper and lower extremities.  Skin: Has a diffuse rash noted with some bullae on the skin vesicles that are broken and other lesions that are healed that she scratched and could be affecting his voiding but unclear currently Neurologic: CN 2-12 grossly intact with no focal deficits. Romberg sign and cerebellar reflexes not assessed.  Psychiatric: Normal judgment and insight. Alert and oriented x 3. Normal mood and appropriate affect.   Data Reviewed: I have personally reviewed following labs and imaging studies  CBC: Recent Labs  Lab 11/19/22 0343 11/20/22 0407 11/21/22 0329 11/22/22 0455 11/22/22 1041 11/22/22 1311 11/23/22 0251 11/24/22 0036  WBC 18.0* 14.3* 10.4 11.8*  --   --  15.7* 16.8*  NEUTROABS 12.0* 9.7* 9.0* 9.4*  --   --   --  14.8*  HGB 9.2* 9.1* 8.5* 7.9* 8.5* 9.2* 9.0* 8.1*  HCT 31.5* 31.2* 29.7* 27.4* 25.0* 27.0* 30.8* 28.0*  MCV 68.2* 68.7* 70.0* 70.8*  --   --  73.3* 74.9*  PLT 158 149* 142* 163  --   --  156 324   Basic Metabolic Panel: Recent Labs  Lab 11/19/22 0343 11/20/22 0407 11/21/22 0329 11/22/22 0455 11/22/22 1041 11/22/22 1311 11/23/22 0251 11/24/22 0036  NA 134* 139 142 141 144 145 143 145  K 2.8* 3.5 4.0 3.7 3.6 3.5 4.2 3.5  CL 104 110 112* 112*  --   --  114* 113*  CO2 '23 23 23 24  '$ --   --  24 25  GLUCOSE 110* 92 160* 143*  --    --  128* 123*  BUN '11 10 10 12  '$ --   --  17 13  CREATININE 0.73 0.61 0.72 0.52  --   --  0.69 0.62  CALCIUM 7.8* 7.8* 8.1* 8.1*  --   --  7.7* 7.8*  MG 2.1 2.2 2.2 2.5*  --   --   --  2.2  PHOS  --   --  2.1* 2.1*  --   --   --  2.7   GFR: Estimated Creatinine Clearance: 149.7 mL/min (by C-G formula based on SCr of 0.62 mg/dL). Liver Function Tests: Recent Labs  Lab 11/21/22 0329 11/22/22 0455 11/24/22 0036  AST 11* 14* 19  ALT '11 12 19  '$ ALKPHOS 52 46 44  BILITOT 0.6 0.4 0.6  PROT 6.0* 6.2* 5.8*  ALBUMIN 2.8* 3.0* 2.6*   No results for input(s): "LIPASE", "AMYLASE" in the last 168 hours. No results for input(s): "AMMONIA" in the last 168 hours. Coagulation Profile: No results for input(s): "INR", "PROTIME" in the last 168 hours. Cardiac Enzymes: No results for input(s): "CKTOTAL", "CKMB", "CKMBINDEX", "TROPONINI" in the last 168 hours. BNP (last 3 results) No results for input(s): "PROBNP" in the last 8760 hours. HbA1C: No results for input(s): "HGBA1C" in the last 72 hours. CBG: No results for input(s): "GLUCAP" in the last 168 hours. Lipid Profile: No results for input(s): "CHOL", "HDL", "LDLCALC", "TRIG", "CHOLHDL", "LDLDIRECT" in the last 72 hours. Thyroid Function Tests: No results for input(s): "TSH", "T4TOTAL", "FREET4", "T3FREE", "THYROIDAB" in the last 72 hours. Anemia Panel: No results for input(s): "VITAMINB12", "FOLATE", "FERRITIN", "TIBC", "IRON", "RETICCTPCT" in the last 72 hours. Sepsis Labs: No results for input(s): "PROCALCITON", "LATICACIDVEN" in the last 168 hours.  Recent Results (from the past 240 hour(s))  Blood culture (routine x 2)     Status: None   Collection Time: 11/14/22  5:30 PM   Specimen: BLOOD RIGHT FOREARM  Result Value Ref Range Status   Specimen Description   Final    BLOOD RIGHT FOREARM Performed at [facility], [place] [address], [place], [place] [address]    Special Requests   Final    BOTTLES DRAWN AEROBIC AND ANAEROBIC  Blood Culture adequate volume Performed at [place] [address], [place], [place] [address]    Culture   Final    NO GROWTH 5 DAYS Performed at [facility], [place] [address], [place], [place] [address]    Report Status 11/19/2022 FINAL  Final  Blood culture (routine x 2)     Status: None   Collection Time: 11/14/22  7:39 PM   Specimen: BLOOD  Result Value Ref Range Status   Specimen Description   Final    BLOOD LEFT ANTECUBITAL Performed at [place] [address], [place], [place] [address]    Special Requests   Final    BOTTLES DRAWN AEROBIC ONLY Blood Culture adequate volume Performed at [place] [address], [place], [place] [address]    Culture   Final    NO GROWTH 5 DAYS Performed at [facility], [place] [address], [place], [place] [address]    Report Status 11/19/2022 FINAL  Final  Urine Culture     Status: Abnormal   Collection Time: 11/14/22 11:22 PM   Specimen: Urine, Catheterized  Result Value Ref Range Status   Specimen Description   Final    URINE, CATHETERIZED Performed at [place] [place] [address], [place], [place] [address]    Special Requests   Final    NONE Performed at [facility], [place] [address], [place], [place] [address]    Culture 10,000 COLONIES/mL ENTEROCOCCUS FAECALIS (A)  Final   Report Status 11/18/2022 FINAL  Final   Organism ID, Bacteria ENTEROCOCCUS FAECALIS (A)  Final      Susceptibility   Enterococcus faecalis - MIC*    AMPICILLIN <=2 SENSITIVE Sensitive     NITROFURANTOIN <=16 SENSITIVE Sensitive     VANCOMYCIN 1 SENSITIVE Sensitive     * 10,000 COLONIES/mL ENTEROCOCCUS FAECALIS  Respiratory (~20 pathogens) panel by PCR     Status: None   Collection Time: 11/15/22 10:35 AM   Specimen: Nasopharyngeal Swab; Respiratory  Result Value Ref Range Status   Adenovirus NOT DETECTED NOT DETECTED Final   Coronavirus  229E NOT DETECTED NOT DETECTED Final    Comment: (NOTE) The Coronavirus on the Respiratory Panel, DOES NOT test for the novel  Coronavirus (2019 nCoV)    Coronavirus HKU1 NOT DETECTED NOT DETECTED Final   Coronavirus NL63 NOT DETECTED NOT DETECTED Final   Coronavirus OC43 NOT DETECTED NOT DETECTED Final   Metapneumovirus NOT DETECTED NOT DETECTED Final   Rhinovirus / Enterovirus NOT DETECTED NOT DETECTED Final   Influenza A NOT DETECTED NOT DETECTED Final   Influenza B NOT DETECTED NOT DETECTED Final   Parainfluenza Virus 1 NOT DETECTED NOT DETECTED Final   Parainfluenza Virus 2 NOT DETECTED NOT DETECTED Final   Parainfluenza Virus 3 NOT DETECTED NOT DETECTED Final   Parainfluenza Virus 4 NOT DETECTED NOT DETECTED Final   Respiratory Syncytial Virus NOT DETECTED NOT DETECTED Final   Bordetella pertussis NOT DETECTED NOT DETECTED Final   Bordetella Parapertussis NOT  DETECTED NOT DETECTED Final   Chlamydophila pneumoniae NOT DETECTED NOT DETECTED Final   Mycoplasma pneumoniae NOT DETECTED NOT DETECTED Final    Comment: Performed at [facility], [place] [address], [place], [place] [address]  Urine Culture     Status: None   Collection Time: 11/17/22  1:02 PM   Specimen: Urine, Catheterized  Result Value Ref Range Status   Specimen Description   Final    URINE, CATHETERIZED Performed at [place] [address] [place], [place] [address]    Special Requests   Final    NONE Performed at [facility], [place] [address], [place], [place] [address]    Culture   Final    NO GROWTH Performed at [facility], [place] [address], [place], [place] [address]    Report Status 11/18/2022 FINAL  Final    Radiology Studies: No results found.  Scheduled Meds:  alteplase  2 mg Intracatheter Once   alteplase  2 mg Intracatheter Once   Chlorhexidine Gluconate Cloth  6 each Topical Daily   enoxaparin (LOVENOX) injection  40 mg Subcutaneous Q24H    feeding supplement  237 mL Oral BID BM   folic acid  1 mg Intravenous Daily   gabapentin  600 mg Oral TID   hydrocortisone cream   Topical BID   lidocaine-EPINEPHrine  10 mL Intradermal Once   loratadine  10 mg Oral Daily   nystatin   Topical q12n4p   Ensure Max Protein  11 oz Oral BID   Continuous Infusions:  famotidine (PEPCID) IV 20 mg (11/24/22 1126)    LOS: 10 days   Raiford Noble, DO [facility] Available via Epic secure chat 7am-7pm After these hours, please refer to coverage provider listed on [email or web address] 11/24/2022, 4:42 PM

## 2022-11-25 ENCOUNTER — Other Ambulatory Visit: Payer: Self-pay

## 2022-11-25 DIAGNOSIS — E876 Hypokalemia: Secondary | ICD-10-CM | POA: Diagnosis not present

## 2022-11-25 DIAGNOSIS — R21 Rash and other nonspecific skin eruption: Secondary | ICD-10-CM | POA: Diagnosis not present

## 2022-11-25 DIAGNOSIS — D72823 Leukemoid reaction: Secondary | ICD-10-CM | POA: Diagnosis not present

## 2022-11-25 LAB — CBC WITH DIFFERENTIAL/PLATELET
Abs Immature Granulocytes: 0.12 10*3/uL — ABNORMAL HIGH (ref 0.00–0.07)
Basophils Absolute: 0 10*3/uL (ref 0.0–0.1)
Basophils Relative: 0 %
Eosinophils Absolute: 2.3 10*3/uL — ABNORMAL HIGH (ref 0.0–0.5)
Eosinophils Relative: 12 %
HCT: 27.3 % — ABNORMAL LOW (ref 36.0–46.0)
Hemoglobin: 7.7 g/dL — ABNORMAL LOW (ref 12.0–15.0)
Immature Granulocytes: 1 %
Lymphocytes Relative: 7 %
Lymphs Abs: 1.3 10*3/uL (ref 0.7–4.0)
MCH: 21.3 pg — ABNORMAL LOW (ref 26.0–34.0)
MCHC: 28.2 g/dL — ABNORMAL LOW (ref 30.0–36.0)
MCV: 75.4 fL — ABNORMAL LOW (ref 80.0–100.0)
Monocytes Absolute: 0.7 10*3/uL (ref 0.1–1.0)
Monocytes Relative: 4 %
Neutro Abs: 14.4 10*3/uL — ABNORMAL HIGH (ref 1.7–7.7)
Neutrophils Relative %: 76 %
Platelets: 198 10*3/uL (ref 150–400)
RBC: 3.62 MIL/uL — ABNORMAL LOW (ref 3.87–5.11)
RDW: 34.6 % — ABNORMAL HIGH (ref 11.5–15.5)
WBC: 18.9 10*3/uL — ABNORMAL HIGH (ref 4.0–10.5)
nRBC: 0 % (ref 0.0–0.2)

## 2022-11-25 LAB — COMPREHENSIVE METABOLIC PANEL
ALT: 14 U/L (ref 0–44)
AST: 13 U/L — ABNORMAL LOW (ref 15–41)
Albumin: 2.3 g/dL — ABNORMAL LOW (ref 3.5–5.0)
Alkaline Phosphatase: 38 U/L (ref 38–126)
Anion gap: 4 — ABNORMAL LOW (ref 5–15)
BUN: 11 mg/dL (ref 6–20)
CO2: 24 mmol/L (ref 22–32)
Calcium: 7.9 mg/dL — ABNORMAL LOW (ref 8.9–10.3)
Chloride: 113 mmol/L — ABNORMAL HIGH (ref 98–111)
Creatinine, Ser: 0.57 mg/dL (ref 0.44–1.00)
GFR, Estimated: >60 mL/min (ref >60)
Glucose, Bld: 92 mg/dL (ref 70–99)
Potassium: 3.8 mmol/L (ref 3.5–5.1)
Sodium: 141 mmol/L (ref 135–145)
Total Bilirubin: 0.7 mg/dL (ref 0.3–1.2)
Total Protein: 5.3 g/dL — ABNORMAL LOW (ref 6.5–8.1)

## 2022-11-25 LAB — PHOSPHORUS: Phosphorus: 2.4 mg/dL — ABNORMAL LOW (ref 2.5–4.6)

## 2022-11-25 LAB — MAGNESIUM: Magnesium: 2.2 mg/dL (ref 1.7–2.4)

## 2022-11-25 MED ORDER — POTASSIUM PHOSPHATES 15 MMOLE/5ML IV SOLN
15.0000 mmol | Freq: Once | INTRAVENOUS | Status: AC
  Administered 2022-11-25: 15 mmol via INTRAVENOUS
  Filled 2022-11-25: qty 5

## 2022-11-25 MED ORDER — METHOCARBAMOL 1000 MG/10ML IJ SOLN
1000.0000 mg | Freq: Four times a day (QID) | INTRAVENOUS | Status: DC
  Administered 2022-11-25 – 2022-11-27 (×8): 1000 mg via INTRAVENOUS
  Filled 2022-11-25: qty 10
  Filled 2022-11-25: qty 1000
  Filled 2022-11-25 (×2): qty 10
  Filled 2022-11-25 (×3): qty 1000
  Filled 2022-11-25: qty 10
  Filled 2022-11-25: qty 1000

## 2022-11-25 MED ORDER — ACETAMINOPHEN 500 MG PO TABS
1000.0000 mg | ORAL_TABLET | Freq: Four times a day (QID) | ORAL | Status: DC
  Administered 2022-11-25 – 2022-12-06 (×25): 1000 mg via ORAL
  Filled 2022-11-25 (×31): qty 2

## 2022-11-25 MED ORDER — SIMETHICONE 80 MG PO CHEW
80.0000 mg | CHEWABLE_TABLET | Freq: Four times a day (QID) | ORAL | Status: DC | PRN
  Administered 2022-11-25 – 2022-12-01 (×5): 80 mg via ORAL
  Filled 2022-11-25 (×5): qty 1

## 2022-11-25 NOTE — Plan of Care (Signed)

## 2022-11-25 NOTE — Progress Notes (Signed)
Patient refused Foley care and did not want to be messed with/bothered. RN Environmental health practitioner.

## 2022-11-25 NOTE — Progress Notes (Signed)
PROGRESS NOTE    [facility]  GOT:[id number] DOB: 1971/06/11 DOA: 11/14/2022 PCP: [facility]   Brief Narrative:  The Patient is a 51 year old female history of paraplegia as a result of laminectomy, history of chronic anemia, morbid obesity, hypertension, constipation presented with a rash ongoing x 2 weeks which had developed on her anterior trunk and extremities and abdomen started on doxycycline and treated with steroids however no significant improvement.  Patient noted not to have any mucosal involvement or rash of the palms of the hands and the feet no involvement of the posterior body.  Patient also noted to have a history of drug-induced constipation.  Patient noted on 10/30/2022 to be started on Bentyl however reported rash started on her legs progressed up to 2 days after starting Bentyl and using steroids only made rash worse and spread with a burning and itching sensation.  Patient noted to be followed by GI for chronic anemia and was to undergo endoscopy in the OR unfortunately developed rash and unable to get this done.  Patient presented to the ED, noted to have a significant anemia with a hemoglobin of 6.8 and admitted.  Patient transfused 2 units packed red blood cells.  Posttransfusion CBC pending.  CT abdomen and pelvis done due to concern for ileus with circumferential irregular thickening of the ascending colon proximal to the hepatic flexure, small mesenteric lymph nodes findings concerning for colonic neoplasm, second potential site of thickening in the sigmoid also raising concern for colorectal neoplasm, significant fecal impaction, left ureteral calculus/calculi with mild fullness of the left ureter stable appearance of intra renal calculi on the left since previous imaging, signs of body wall edema.  GI consulted for further evaluation.  Patient for colonoscopy 11/19/2022.  ID also consulted for evaluation of rash.    Colonoscopy done after CT scan of the  abdomen pelvis shows circumferential irregular thickening of the ascending colon proximal to the hepatic flexure as well as small mesenteric lymph nodes and a second potential site of thickening in the sigmoid colon.  Colonoscopy was done which showed 2 masses at the hepatic flexure and sigmoid colon biopsy was obtained and results are still pending.  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  Oncology has discussed the role of adjuvant chemotherapy for stage III colon cancer and some high risk stage II disease and she does not have high risk disease she may be a oral chemo candidate with capecitabine due to her paraplegia she would not be a candidate for intravenous chemo.   Patient was taken to the OR on 11/22/22 for a laparoscopic right colectomy with ileocolonic anastomosis, laparoscopic sigmoid resection with end colostomy and skin biopsy and was seen in the PACU and was somnolent and drowsy after receiving anesthesia.   On 12/2 through ongoing today she is awake but she is in pain and appeared a little withdrawn.  Thinks her rash is improving.  Surgery has placed her on clear liquid diet   She is doing okay and tolerating clears for now but surgery wants her to try and get up and out of bed.  Still complain about some significant pain but ostomy is working.  She has some abdominal distention and bloating so she was given Gas-X and General Surgery recommending current plan of care.   Assessment and Plan:  Diffuse Rash improving -Patient  noted to have developed a rash for the past 2 weeks which have worsened, rash with bullous changes and per ID concern for erythema multiforme with no severe sloughing or Nikolsky sign noted. -Patient with no mucosal involvement and no prior HSV infection. -HIV negative.  RPR nonreactive.  Respiratory viral PCR  negative. -Mycoplasma IgM <770.  -ID recommending general surgery evaluation for punch biopsy and will be done  -ID feels could be possibly related to Bentyl. -Also concern for possible paraneoplastic rash. -ID recommending calamine lotion for itching and will need outpatient follow-up with dermatology. 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 -FOBT negative. -Hemoglobin noted at 6.8 on admission (11/14/2022). -Status posttransfusion 2 units packed red blood cells,  -Anemia panel with iron level of 12, ferritin of 3, folate of 4.6. -CT abdomen and pelvis with circumferential irregular thickening of the ascending colon proximal to the hepatic flexure with adjacent small mesenteric lymph nodes concerning for colonic neoplasm, potential second site in the sigmoid also raising concern for additional colorectal neoplasm of the mid sigmoid colon less likely colitis.  Fecal impaction also noted. -GI consulted for further evaluation and patient started on a bowel prep for colonoscopy.  Status post 2 to 3 days bowel prep which has been completed and patient underwent colonoscopy which showed an ulcerated nonobstructing medium-sized mass found in the rectosigmoid colon where the mass was noncircumferential and oozing was present.  This was biopsied with a cold forceps for histology.  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 Gurney Maxin; Follow up Pathology on Masses  -She is having a full workup and underwent a CT of the chest without contrast today which showed "No worrisome pulmonary lesions or pulmonary nodules to suggest metastatic disease. Enlarged bilateral axillary lymph nodes are indeterminate. Abnormal appearance of the sternal manubrium. Could not exclude a permeative neoplastic process. Recommend correlation any sternal pain or tenderness. MRI may be helpful for further evaluation. Postoperative changes involving the thoracic spine with previouslaminectomies and benign-appearing postoperative fluid collection in the subcutaneous fat." -Hemoglobin/Hematocrit has gone from 10.3/35.8 -> 9.2/31.5 -> 9.1/31.2 -> 8.5/29.7 -> 7.9/27.4 -> 8.5/25.0 -> 9.2/27.0 -> 9.0/30.8 -> 8.1/28.0 -> 7.7/27.3 -She had a central venous catheter for her surgery but will remove given that she still has a PICC line is functioning well -Follow H&H, transfusion threshold hemoglobin < 7. 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  -Oncology has discussed the role of adjuvant chemotherapy for stage III colon cancer and  some high risk stage II disease and she does not have high risk disease she may be a oral chemo candidate with capecitabine due to her paraplegia she would not be a candidate for intravenous chemo. -Surgery recommending continuing to keep her on clears unless emesis develops and recommended trying to get her up and out of bed she continues to have some nausea and bloating today and so having no bowel movements but has flatus in her ostomy bag.  She was given Gas-X for her abdominal bloating -Surgery feels that she may have a postop ileus and recommends continuing clear liquid diet for now considering n.p.o. and NGT if she continues to have emesis   Hypokalemia -Secondary to GI losses.   -Potassium is now 3.8 -Magnesium was 2.2 -Continue potassium repletion and magnesium monitoring -Repeat CMP in the a.m.   Hypophosphatemia -Patient's Phos Level is now 2.4 -Replete with IV K Phos 15 mmol -Continue to Monitor and Replete as Necessary -Repeat CMP in the AM    Constipation -Likely drug-induced. -Disimpaction ordered initially on admission.. -Patient was placed on Dulcolax suppository and Senokot as also received a smog enema.   -Status post bowel prep for in anticipation of colonoscopy .  -Post colonoscopy will likely need to be placed on a bowel regimen. -GI was following and signed off and she now has an OSTOMY   Hypertension -BP currently controlled off antihypertensive medications. -Continue to Monitor BP per Protocol -Last BP reading was 134/78   Super Morbid Obesity -Complicates overall prognosis and care -Estimated body mass index is 68.28 kg/m as calculated from the following:   Height as of this encounter: '5\' 6"'$  (1.676 m).   Weight as of this encounter: 191.9 kg.  -Weight Loss and Dietary Counseling given and will need continual Lifestyle modification -Outpatient follow-up with PCP.   Leukocytosis, worsening  -With a worsening leukocytosis initially but trending back  down.  As WBC has gone from 20.8 -> 25.8 -> 18.5 -> 18.0 -> 14.3 -> 10.4 -> 15.7 -> 16.8 -> 18.9 and likely reactive in the setting of Surgery and Steroid Demargination  -Currently afebrile. -Repeat UA with trace leukocytes, negative nitrites, 11-20 WBCs.   -Urine cultures with 10,000 colonies of Enterococcus faecalis.   -Blood cultures pending with no growth to date.  -Respiratory viral panel negative. -Chest x-ray with no acute abnormalities.. -Patient nontoxic-appearing. -Monitor off antibiotics at this time and repeat labs in the AM.   Thrombocytopenia -The patient's platelet count dropped from 358 -> 336 -> 203 -> 158 -> 149 -> 142 -> 163 -> 156 -> 151 -> 198 -Continue to monitor for signs and symptoms of bleeding; no overt bleeding noted -Repeat CBC in a.m.   Paraplegia -As a result of a laminectomy -Continue supportive care  DVT prophylaxis: enoxaparin (LOVENOX) injection 40 mg Start: 11/23/22 0800 SCD's Start: 11/22/22 1741 Place TED hose Start: 11/22/22 1741 SCDs Start: 11/14/22 2005    Code Status: Full Code Family Communication: No family currently at bedside  Disposition Plan:  Level of care: Progressive Status is: Inpatient Remains inpatient appropriate because: He is improving slowly and needs further clinical improvement and clearance by specialists   Consultants:  Gastroenterology General Surgery Medical Oncology   Procedures:  As delineated as above   Patient underwent laparoscopic right colectomy with ileocolonic anastomosis, laparoscopic sigmoid resection with end colostomy and a skin biopsy   Antimicrobials:  Anti-infectives (From admission, onward)    Start     Dose/Rate Route Frequency Ordered Stop   11/22/22 0600  cefoTEtan (CEFOTAN) 2 g in sodium chloride 0.9 % 100 mL IVPB        2 g 200 mL/hr over 30 Minutes Intravenous On call to O.R. 11/21/22 1427 11/22/22 1014   11/21/22 2200  neomycin (MYCIFRADIN) tablet 1,000 mg  Status:  Discontinued        See Hyperspace for full Linked Orders Report.   1,000 mg Oral 3 times per day 11/21/22 1427 11/21/22 1733   11/21/22 2200  metroNIDAZOLE (FLAGYL) tablet 1,000 mg  Status:  Discontinued       See Hyperspace for full Linked Orders Report.   1,000 mg Oral 3 times per day 11/21/22 1427 11/21/22 1733   11/21/22 2200  metroNIDAZOLE (FLAGYL) tablet 1,000 mg       See Hyperspace for full Linked Orders Report.   1,000 mg Oral  Once 11/21/22 1731 11/21/22 2120   11/21/22 2200  neomycin (MYCIFRADIN) tablet 1,000 mg       See Hyperspace for full Linked Orders Report.   1,000 mg Oral  Once 11/21/22 1731 11/21/22 2231   11/21/22 1900  metroNIDAZOLE (FLAGYL) tablet 1,000 mg       See Hyperspace for full Linked Orders Report.   1,000 mg Oral  Once 11/21/22 1731 11/21/22 1918   11/21/22 1900  neomycin (MYCIFRADIN) tablet 1,000 mg       See Hyperspace for full Linked Orders Report.   1,000 mg Oral  Once 11/21/22 1731 11/21/22 1918   11/21/22 1800  metroNIDAZOLE (FLAGYL) tablet 1,000 mg       See Hyperspace for full Linked Orders Report.   1,000 mg Oral  Once 11/21/22 1731 11/21/22 1753   11/21/22 1800  neomycin (MYCIFRADIN) tablet 1,000 mg       See Hyperspace for full Linked Orders Report.   1,000 mg Oral  Once 11/21/22 1731 11/21/22 1753   11/14/22 1545  cefTRIAXone (ROCEPHIN) 1 g in sodium chloride 0.9 % 100 mL IVPB        1 g 200 mL/hr over 30 Minutes Intravenous  Once 11/14/22 1539 11/15/22 0700       Subjective: Seen and examined at bedside and she is still complain of some abdominal discomfort and "bloating."  No vomiting but was having some nausea.  Thinks her rash is doing much better and is not as itchy.  No other concerns or complaints this time.   Objective: Vitals:   11/24/22 2110 11/25/22 0500 11/25/22 0625 11/25/22 1210  BP: 130/74   134/78  Pulse: (!) 102   97  Resp: '18  19 20  '$ Temp: 98.8 F (37.1 C)   99 F (37.2 C)  TempSrc: Oral   Oral  SpO2: 100%   99%  Weight:  (!)  191.9 kg    Height:        Intake/Output Summary (Last 24 hours) at 11/25/2022 1908 Last data filed at 11/25/2022 1100 Gross per 24 hour  Intake 578.38 ml  Output 700 ml  Net -121.62 ml   Filed Weights   11/23/22 0500 11/24/22 0500 11/25/22 0500  Weight: (!) 190.5 kg (!) 189.7 kg (!) 191.9 kg   Examination: Physical Exam:  Constitutional: WN/WD super morbidly obese African-American  female in no acute distress appears calm but a little fatigued Respiratory: Diminished to auscultation bilaterally with coarse breath sounds, no wheezing, rales, rhonchi or crackles. Normal respiratory effort and patient is not tachypenic. No accessory muscle use.  Unlabored breathing Cardiovascular: RRR, no murmurs / rubs / gallops. S1 and S2 auscultated.  Abdomen: Soft, slightly tender to palpate, distended secondary to body habitus bowel sounds positive.  GU: Deferred. Musculoskeletal: No clubbing / cyanosis of digits/nails. No joint deformity upper and lower extremities.  Skin: No rashes, lesions, ulcers limited skin evaluation. No induration; Warm and dry.  Neurologic: CN 2-12 grossly intact with no focal deficits. Romberg sign and cerebellar reflexes not assessed.  Psychiatric: Normal judgment and insight. Alert and oriented x 3. Normal mood and appropriate affect.   Data Reviewed: I have personally reviewed following labs and imaging studies  CBC: Recent Labs  Lab 11/20/22 0407 11/21/22 0329 11/22/22 0455 11/22/22 1041 11/22/22 1311 11/23/22 0251 11/24/22 0036 11/25/22 0210  WBC 14.3* 10.4 11.8*  --   --  15.7* 16.8* 18.9*  NEUTROABS 9.7* 9.0* 9.4*  --   --   --  14.8* 14.4*  HGB 9.1* 8.5* 7.9* 8.5* 9.2* 9.0* 8.1* 7.7*  HCT 31.2* 29.7* 27.4* 25.0* 27.0* 30.8* 28.0* 27.3*  MCV 68.7* 70.0* 70.8*  --   --  73.3* 74.9* 75.4*  PLT 149* 142* 163  --   --  156 151 809   Basic Metabolic Panel: Recent Labs  Lab 11/20/22 0407 11/21/22 0329 11/22/22 0455 11/22/22 1041 11/22/22 1311  11/23/22 0251 11/24/22 0036 11/25/22 0210  NA 139 142 141 144 145 143 145 141  K 3.5 4.0 3.7 3.6 3.5 4.2 3.5 3.8  CL 110 112* 112*  --   --  114* 113* 113*  CO2 '23 23 24  '$ --   --  '24 25 24  '$ GLUCOSE 92 160* 143*  --   --  128* 123* 92  BUN '10 10 12  '$ --   --  '17 13 11  '$ CREATININE 0.61 0.72 0.52  --   --  0.69 0.62 0.57  CALCIUM 7.8* 8.1* 8.1*  --   --  7.7* 7.8* 7.9*  MG 2.2 2.2 2.5*  --   --   --  2.2 2.2  PHOS  --  2.1* 2.1*  --   --   --  2.7 2.4*   GFR: Estimated Creatinine Clearance: 150.8 mL/min (by C-G formula based on SCr of 0.57 mg/dL). Liver Function Tests: Recent Labs  Lab 11/21/22 0329 11/22/22 0455 11/24/22 0036 11/25/22 0210  AST 11* 14* 19 13*  ALT '11 12 19 14  '$ ALKPHOS 52 46 44 38  BILITOT 0.6 0.4 0.6 0.7  PROT 6.0* 6.2* 5.8* 5.3*  ALBUMIN 2.8* 3.0* 2.6* 2.3*   No results for input(s): "LIPASE", "AMYLASE" in the last 168 hours. No results for input(s): "AMMONIA" in the last 168 hours. Coagulation Profile: No results for input(s): "INR", "PROTIME" in the last 168 hours. Cardiac Enzymes: No results for input(s): "CKTOTAL", "CKMB", "CKMBINDEX", "TROPONINI" in the last 168 hours. BNP (last 3 results) No results for input(s): "PROBNP" in the last 8760 hours. HbA1C: No results for input(s): "HGBA1C" in the last 72 hours. CBG: No results for input(s): "GLUCAP" in the last 168 hours. Lipid Profile: No results for input(s): "CHOL", "HDL", "LDLCALC", "TRIG", "CHOLHDL", "LDLDIRECT" in the last 72 hours. Thyroid Function Tests: No results for input(s): "TSH", "T4TOTAL", "FREET4", "T3FREE", "THYROIDAB" in the last 72 hours. Anemia Panel:  No results for input(s): "VITAMINB12", "FOLATE", "FERRITIN", "TIBC", "IRON", "RETICCTPCT" in the last 72 hours. Sepsis Labs: No results for input(s): "PROCALCITON", "LATICACIDVEN" in the last 168 hours.  Recent Results (from the past 240 hour(s))  Urine Culture     Status: None   Collection Time: 11/17/22  1:02 PM   Specimen:  Urine, Catheterized  Result Value Ref Range Status   Specimen Description   Final    URINE, CATHETERIZED Performed at [facility] [address] [place], [place] [address]    Special Requests   Final    NONE Performed at [facility], [place] [address] [place], [place] [address]    Culture   Final    NO GROWTH Performed at [facility], [place] [address], [place], [place] [address]    Report Status 11/18/2022 FINAL  Final    Radiology Studies: No results found.  Scheduled Meds:  acetaminophen  1,000 mg Oral Q6H   alteplase  2 mg Intracatheter Once   alteplase  2 mg Intracatheter Once   Chlorhexidine Gluconate Cloth  6 each Topical Daily   enoxaparin (LOVENOX) injection  40 mg Subcutaneous Q24H   feeding supplement  237 mL Oral BID BM   folic acid  1 mg Intravenous Daily   gabapentin  600 mg Oral TID   hydrocortisone cream   Topical BID   lidocaine-EPINEPHrine  10 mL Intradermal Once   loratadine  10 mg Oral Daily   nystatin   Topical q12n4p   Ensure Max Protein  11 oz Oral BID   Continuous Infusions:  famotidine (PEPCID) IV 20 mg (11/25/22 1003)   methocarbamol (ROBAXIN) IV 1,000 mg (11/25/22 1742)    LOS: 11 days   Raiford Noble, DO [facility] Available via Epic secure chat 7am-7pm After these hours, please refer to coverage provider listed on [email or web address] 11/25/2022, 7:08 PM

## 2022-11-25 NOTE — Progress Notes (Signed)
3 Days Post-Op   Subjective/Chief Complaint: Feeling bloated and "tight" and having related abdominal pain. Nauseas but no emesis. Sipping on clears  Objective: Vital signs in last 24 hours: Temp:  [98.8 F (37.1 C)-98.9 F (37.2 C)] 98.8 F (37.1 C) (12/03 2110) Pulse Rate:  [102] 102 (12/03 2110) Resp:  [18-20] 19 (12/04 0625) BP: (130-147)/(74-82) 130/74 (12/03 2110) SpO2:  [99 %-100 %] 100 % (12/03 2110) Weight:  [191.9 kg] 191.9 kg (12/04 0500) Last BM Date : 11/22/22  Intake/Output from previous day: 12/03 0701 - 12/04 0700 In: 650 [P.O.:150; IV Piggyback:500] Out: 700 [Urine:700] Intake/Output this shift: No intake/output data recorded.  Exam: Awake and alert Abdomen morbidly obese, soft, incision clean, air and SS drainage in ostomy bag  Lab Results:  Recent Labs    11/24/22 0036 11/25/22 0210  WBC 16.8* 18.9*  HGB 8.1* 7.7*  HCT 28.0* 27.3*  PLT 151 198    BMET Recent Labs    11/24/22 0036 11/25/22 0210  NA 145 141  K 3.5 3.8  CL 113* 113*  CO2 25 24  GLUCOSE 123* 92  BUN 13 11  CREATININE 0.62 0.57  CALCIUM 7.8* 7.9*    PT/INR No results for input(s): "LABPROT", "INR" in the last 72 hours. ABG Recent Labs    11/22/22 1041 11/22/22 1311  PHART 7.389 7.341*  HCO3 21.8 20.4     Studies/Results: No results found.  Anti-infectives: Anti-infectives (From admission, onward)    Start     Dose/Rate Route Frequency Ordered Stop   11/22/22 0600  cefoTEtan (CEFOTAN) 2 g in sodium chloride 0.9 % 100 mL IVPB        2 g 200 mL/hr over 30 Minutes Intravenous On call to O.R. 11/21/22 1427 11/22/22 1014   11/21/22 2200  neomycin (MYCIFRADIN) tablet 1,000 mg  Status:  Discontinued       See Hyperspace for full Linked Orders Report.   1,000 mg Oral 3 times per day 11/21/22 1427 11/21/22 1733   11/21/22 2200  metroNIDAZOLE (FLAGYL) tablet 1,000 mg  Status:  Discontinued       See Hyperspace for full Linked Orders Report.   1,000 mg Oral 3 times  per day 11/21/22 1427 11/21/22 1733   11/21/22 2200  metroNIDAZOLE (FLAGYL) tablet 1,000 mg       See Hyperspace for full Linked Orders Report.   1,000 mg Oral  Once 11/21/22 1731 11/21/22 2120   11/21/22 2200  neomycin (MYCIFRADIN) tablet 1,000 mg       See Hyperspace for full Linked Orders Report.   1,000 mg Oral  Once 11/21/22 1731 11/21/22 2231   11/21/22 1900  metroNIDAZOLE (FLAGYL) tablet 1,000 mg       See Hyperspace for full Linked Orders Report.   1,000 mg Oral  Once 11/21/22 1731 11/21/22 1918   11/21/22 1900  neomycin (MYCIFRADIN) tablet 1,000 mg       See Hyperspace for full Linked Orders Report.   1,000 mg Oral  Once 11/21/22 1731 11/21/22 1918   11/21/22 1800  metroNIDAZOLE (FLAGYL) tablet 1,000 mg       See Hyperspace for full Linked Orders Report.   1,000 mg Oral  Once 11/21/22 1731 11/21/22 1753   11/21/22 1800  neomycin (MYCIFRADIN) tablet 1,000 mg       See Hyperspace for full Linked Orders Report.   1,000 mg Oral  Once 11/21/22 1731 11/21/22 1753   11/14/22 1545  cefTRIAXone (ROCEPHIN) 1 g in sodium chloride  0.9 % 100 mL IVPB        1 g 200 mL/hr over 30 Minutes Intravenous  Once 11/14/22 1539 11/15/22 0700       Assessment/Plan: POD 3 s/p  laparoscopic right colectomy with ileo-colic anastomosis, laparoscopic sigmoid resection with end colostomy, skin biopsy  - c scope bx with adenocarcinoma. Surgical path pending - WBC up to 18.9 - afebrile. Continue to trend and can consider CT later this week if still elevated. She had been on steroids - CEA 51.3, CT CH without nodules, enlarged b/l axillary lymph nodes. Onc has been consulted - Hgb down to 7.7 (8.1) - repeat in am - likely post op ileus - stay on CLD for now and consider NPO and NGT if emesis develops  FEN: CLD ID: [id number] periop VTE: lovenox  Per primary Rash s/p punch bx HTN Electrolyte abnormalities Paraplegia Obesity Thrombocytopenia    LOS: 11 days   Winferd Humphrey, [facility] Surgery 11/25/2022, 10:04 AM Please see Amion for pager number during day hours 7:00am-4:30pm

## 2022-11-25 NOTE — Progress Notes (Signed)
Physical Therapy Treatment Patient Details Name: Ellen Hill MRN: [id number] DOB: [birth year] Today's Date: 11/25/2022   History of Present Illness 51 y.o. female admitted 11/14/22 for rash and symptomatic anemia.  Past medical history significant of paraplegia as a result of laminectomy, chronic anemia, morbid obesity, hypertension, constipation, neurogenic bladder, UTI.  Pt s/p laparoscopic right colectomy with ileo-colic anastomosis, laparoscopic sigmoid resection with end colostomy, skin biopsy on 11/22/22. (Simultaneous filing. User may not have seen previous data.)    PT Comments    Pt assisted to sitting EOB and reports abdominal pain at surgical sites.  Pt typically uses hoyer lift for OOB at baseline.  Pt has caregiver a few hours a day and now s/p surgery with colostomy.  Pt would likely have best outcomes in home setting if possible (increase home care if able).  However, if this is not available, pt may require more assist upon d/c for ADLs and colostomy care so pt may need SNF.     Recommendations for follow up therapy are one component of a multi-disciplinary discharge planning process, led by the attending physician.  Recommendations may be updated based on patient status, additional functional criteria and insurance authorization.  Follow Up Recommendations  Home health PT     Assistance Recommended at Discharge Intermittent Supervision/Assistance  Patient can return home with the following A little help with walking and/or transfers   Equipment Recommendations  None recommended by PT    Recommendations for Other Services       Precautions / Restrictions Precautions Precautions: Fall Precaution Comments: R UQ colostomy Restrictions Weight Bearing Restrictions: No     Mobility  Bed Mobility Overal bed mobility: Needs Assistance Bed Mobility: Supine to Sit, Sit to Supine     Supine to sit: Max assist, +2 for physical assistance Sit to supine: Max assist,  +2 for physical assistance   General bed mobility comments: pt assisting with upper body using rails, assist mostly for lower body and positioning - utilized bed pad    Transfers                        Ambulation/Gait                   Stairs             Wheelchair Mobility    Modified Rankin (Stroke Patients Only)       Balance Overall balance assessment: Needs assistance Sitting-balance support: Single extremity supported, Feet supported Sitting balance-Leahy Scale: Poor Sitting balance - Comments: held onto rail with right UE, sat EOB a few minutes until abdominal pain increased then requested return to supine                                    Cognition Arousal/Alertness: Awake/alert Behavior During Therapy: Flat affect Overall Cognitive Status: Within Functional Limits for tasks assessed                                          Exercises      General Comments        Pertinent Vitals/Pain Pain Assessment Pain Assessment: 0-10 Pain Score: 10-Worst pain ever Pain Location: abdomen Pain Descriptors / Indicators: Sore Pain Intervention(s): Repositioned, Monitored during session, Premedicated before session  Home Living                          Prior Function            PT Goals (current goals can now be found in the care plan section) Progress towards PT goals: Progressing toward goals    Frequency    Min 2X/week      PT Plan Current plan remains appropriate    Co-evaluation   Reason for Co-Treatment: To address functional/ADL transfers;For patient/therapist safety PT goals addressed during session: Mobility/safety with mobility OT goals addressed during session: ADL's and self-care      AM-PAC PT "6 Clicks" Mobility   Outcome Measure  Help needed turning from your back to your side while in a flat bed without using bedrails?: A Lot Help needed moving from lying on your  back to sitting on the side of a flat bed without using bedrails?: A Lot Help needed moving to and from a bed to a chair (including a wheelchair)?: Total Help needed standing up from a chair using your arms (e.g., wheelchair or bedside chair)?: Total Help needed to walk in hospital room?: Total Help needed climbing 3-5 steps with a railing? : Total 6 Click Score: 8    End of Session   Activity Tolerance: Patient limited by pain Patient left: in bed;with call bell/phone within reach Nurse Communication: Mobility status PT Visit Diagnosis: Muscle weakness (generalized) (M62.81);Other abnormalities of gait and mobility (R26.89)     Time: 2458-0998 PT Time Calculation (min) (ACUTE ONLY): 16 min  Charges:  $Therapeutic Activity: 8-22 mins                    Jannette Spanner PT, DPT Physical Therapist Acute Rehabilitation Services Preferred contact method: Secure Chat Weekend Pager Only: [phone] Office: [place] 11/25/2022, 4:04 PM

## 2022-11-25 NOTE — Progress Notes (Signed)
Occupational Therapy Treatment Patient Details Name: Ellen Hill MRN: [id number] DOB: 1971/05/16 Today's Date: 11/25/2022   History of present illness Patient is a 51 y.o. female admitted 11/14/22 for rash and symptomatic anemia.  Past medical history significant of paraplegia as a result of laminectomy, chronic anemia, morbid obesity, hypertension, constipation, neurogenic bladder, UTI.  Pt s/p laparoscopic right colectomy with ileo-colic anastomosis, laparoscopic sigmoid resection with end colostomy, skin biopsy on 11/22/22.   OT comments  Patient was able to engage in sitting EOB with +2 assistance. Patient limited by pain of 10/10 with pain medications on board. Patient was able to engage in grooming tasks sitting in chair position in bed. Patient would continue to benefit from skilled OT services at this time while admitted and after d/c to address noted deficits in order to improve overall safety and independence in ADLs.     Recommendations for follow up therapy are one component of a multi-disciplinary discharge planning process, led by the attending physician.  Recommendations may be updated based on patient status, additional functional criteria and insurance authorization.    Follow Up Recommendations  Home health OT     Assistance Recommended at Discharge Frequent or constant Supervision/Assistance  Patient can return home with the following  Assistance with cooking/housework;A lot of help with bathing/dressing/bathroom   Equipment Recommendations  None recommended by OT    Recommendations for Other Services      Precautions / Restrictions Precautions Precautions: Fall Precaution Comments: R UQ colostomy Restrictions Weight Bearing Restrictions: No       Mobility Bed Mobility Overal bed mobility: Needs Assistance Bed Mobility: Supine to Sit, Sit to Supine     Supine to sit: Max assist, +2 for physical assistance Sit to supine: Max assist, +2 for physical  assistance   General bed mobility comments: pt assisting with upper body using rails, assist mostly for lower body and positioning - utilized bed pad    Transfers                         Balance                                           ADL either performed or assessed with clinical judgement   ADL Overall ADL's : Needs assistance/impaired     Grooming: Set up;Bed level;Wash/dry hands;Wash/dry face;Oral care Grooming Details (indicate cue type and reason): with increased time   Upper Body Bathing Details (indicate cue type and reason): declined stating nursing was going to help with bathing/new hosptial gown later.                           General ADL Comments: Patient was able to transition to sitting EOB with +3 with increased time and cues for movements. patient ws able to sit unsupported with BUE support for a few minutes but unable to sustain with pain levels. patient returned to chair position in bed to complete grooming tasks. patient requested to go back into reclined position after grooming tasks.    Extremity/Trunk Assessment              Vision       Perception     Praxis      Cognition Arousal/Alertness: Awake/alert Behavior During Therapy: Flat affect Overall Cognitive Status: Within Functional Limits for  tasks assessed                                          Exercises      Shoulder Instructions       General Comments      Pertinent Vitals/ Pain       Pain Assessment Pain Assessment: 0-10 Pain Score: 10-Worst pain ever Pain Location: abdomen Pain Descriptors / Indicators: Sore Pain Intervention(s): Limited activity within patient's tolerance, Monitored during session, Repositioned  Home Living                                          Prior Functioning/Environment              Frequency  Min 2X/week        Progress Toward Goals  OT Goals(current  goals can now be found in the care plan section)  Progress towards OT goals: Progressing toward goals     Plan Discharge plan remains appropriate    Co-evaluation    PT/OT/SLP Co-Evaluation/Treatment: Yes Reason for Co-Treatment: To address functional/ADL transfers;For patient/therapist safety PT goals addressed during session: Mobility/safety with mobility OT goals addressed during session: ADL's and self-care      AM-PAC OT "6 Clicks" Daily Activity     Outcome Measure   Help from another person eating meals?: None Help from another person taking care of personal grooming?: None Help from another person toileting, which includes using toliet, bedpan, or urinal?: Total Help from another person bathing (including washing, rinsing, drying)?: A Lot Help from another person to put on and taking off regular upper body clothing?: A Little Help from another person to put on and taking off regular lower body clothing?: Total 6 Click Score: 15    End of Session    OT Visit Diagnosis: Muscle weakness (generalized) (M62.81)   Activity Tolerance Patient tolerated treatment well   Patient Left in bed;with call bell/phone within reach   Nurse Communication Mobility status;Other (comment) (called into room to address IV)        Time: 1415-1444 OT Time Calculation (min): 29 min  Charges: OT General Charges $OT Visit: 1 Visit OT Treatments $Self Care/Home Management : 8-22 mins  Rennie Plowman, MS Acute Rehabilitation Department Office# [phone]   Willa Rough 11/25/2022, 4:16 PM

## 2022-11-25 NOTE — Care Management Important Message (Signed)
Important Message  Patient Details IM Letter given. Name: Ellen Hill MRN: [id number] Date of Birth: 03-25-1971   Medicare Important Message Given:  Yes     Kerin Salen 11/25/2022, 11:41 AM

## 2022-11-25 NOTE — Consult Note (Signed)
[facility] Nurse ostomy consult note Stoma type/location: LUQ colostomy Stomal assessment/size: 2 inches, deep red, moist, raised, edematous, lumen at 5 o'clock but not at skin level Peristomal assessment: intact, dry Treatment options for stomal/peristomal skin: skin barrier ring Output: small mucus, serosanguinous drainage, flatus Ostomy pouching: 2pc. 2 and 3/4 inch pouching system with skin barrier ring Education provided:  Explained role of ostomy nurse and creation of stoma  Explained stoma characteristics (budded, flush, color, texture, care) Demonstrated pouch change (cutting new skin barrier, measuring stoma, cleaning peristomal skin and stoma, use of barrier ring) Education on emptying when 1/3 to 1/2 full and how to empty Demonstrated "burping" flatus from pouch Demonstrated use of toilet paper wick to clean spout   Patient able to perform Lock and Roll closure mechanism x2 independently and demonstrate how to perform a simulated emptying.  Answered patient questions.    Supplies at bedside today: 2 skin barriers, 2 pouches, 2 skin barrier rings, stoma measuring guide, educational folder.   Enrolled patient in [place] program: Yes, today  Patient is quiet but interactive and open to learning. I will see her again tomorrow for continued teaching.   [facility] nursing team will follow, and will remain available to this patient, the nursing and medical teams.   Thank you for inviting [place] to participate in this patient's Plan of Care.  Maudie Flakes, MSN, RN, CNS, [place], Serita Grammes, [employer], [employer] phone:  [phone]

## 2022-11-26 DIAGNOSIS — E876 Hypokalemia: Secondary | ICD-10-CM | POA: Diagnosis not present

## 2022-11-26 DIAGNOSIS — D72823 Leukemoid reaction: Secondary | ICD-10-CM | POA: Diagnosis not present

## 2022-11-26 DIAGNOSIS — R21 Rash and other nonspecific skin eruption: Secondary | ICD-10-CM | POA: Diagnosis not present

## 2022-11-26 LAB — COMPREHENSIVE METABOLIC PANEL
ALT: 12 U/L (ref 0–44)
AST: 13 U/L — ABNORMAL LOW (ref 15–41)
Albumin: 2.4 g/dL — ABNORMAL LOW (ref 3.5–5.0)
Alkaline Phosphatase: 44 U/L (ref 38–126)
Anion gap: 6 (ref 5–15)
BUN: 11 mg/dL (ref 6–20)
CO2: 24 mmol/L (ref 22–32)
Calcium: 7.7 mg/dL — ABNORMAL LOW (ref 8.9–10.3)
Chloride: 109 mmol/L (ref 98–111)
Creatinine, Ser: 0.49 mg/dL (ref 0.44–1.00)
GFR, Estimated: >60 mL/min (ref >60)
Glucose, Bld: 90 mg/dL (ref 70–99)
Potassium: 3.6 mmol/L (ref 3.5–5.1)
Sodium: 139 mmol/L (ref 135–145)
Total Bilirubin: 0.7 mg/dL (ref 0.3–1.2)
Total Protein: 5.6 g/dL — ABNORMAL LOW (ref 6.5–8.1)

## 2022-11-26 LAB — TYPE AND SCREEN
ABO/RH(D): A POS
Antibody Screen: NEGATIVE
Unit division: 0
Unit division: 0

## 2022-11-26 LAB — CBC WITH DIFFERENTIAL/PLATELET
Abs Immature Granulocytes: 0.11 10*3/uL — ABNORMAL HIGH (ref 0.00–0.07)
Abs Immature Granulocytes: 0.05 10*3/uL (ref 0.00–0.07)
Basophils Absolute: 0 10*3/uL (ref 0.0–0.1)
Basophils Absolute: 0 10*3/uL (ref 0.0–0.1)
Basophils Relative: 0 %
Basophils Relative: 0 %
Eosinophils Absolute: 2.6 10*3/uL — ABNORMAL HIGH (ref 0.0–0.5)
Eosinophils Absolute: 2.5 10*3/uL — ABNORMAL HIGH (ref 0.0–0.5)
Eosinophils Relative: 16 %
Eosinophils Relative: 16 %
HCT: 28.7 % — ABNORMAL LOW (ref 36.0–46.0)
HCT: 29.2 % — ABNORMAL LOW (ref 36.0–46.0)
Hemoglobin: 8.3 g/dL — ABNORMAL LOW (ref 12.0–15.0)
Hemoglobin: 8.5 g/dL — ABNORMAL LOW (ref 12.0–15.0)
Immature Granulocytes: 1 %
Immature Granulocytes: 0 %
Lymphocytes Relative: 7 %
Lymphocytes Relative: 12 %
Lymphs Abs: 1.2 10*3/uL (ref 0.7–4.0)
Lymphs Abs: 1.8 10*3/uL (ref 0.7–4.0)
MCH: 21.6 pg — ABNORMAL LOW (ref 26.0–34.0)
MCH: 21.7 pg — ABNORMAL LOW (ref 26.0–34.0)
MCHC: 28.9 g/dL — ABNORMAL LOW (ref 30.0–36.0)
MCHC: 29.1 g/dL — ABNORMAL LOW (ref 30.0–36.0)
MCV: 74.7 fL — ABNORMAL LOW (ref 80.0–100.0)
MCV: 74.7 fL — ABNORMAL LOW (ref 80.0–100.0)
Monocytes Absolute: 0.9 10*3/uL (ref 0.1–1.0)
Monocytes Absolute: 1.1 10*3/uL — ABNORMAL HIGH (ref 0.1–1.0)
Monocytes Relative: 5 %
Monocytes Relative: 7 %
Neutro Abs: 11.6 10*3/uL — ABNORMAL HIGH (ref 1.7–7.7)
Neutro Abs: 9.6 10*3/uL — ABNORMAL HIGH (ref 1.7–7.7)
Neutrophils Relative %: 71 %
Neutrophils Relative %: 65 %
Platelets: 268 10*3/uL (ref 150–400)
Platelets: 299 10*3/uL (ref 150–400)
RBC: 3.84 MIL/uL — ABNORMAL LOW (ref 3.87–5.11)
RBC: 3.91 MIL/uL (ref 3.87–5.11)
RDW: 34.5 % — ABNORMAL HIGH (ref 11.5–15.5)
WBC: 16.3 10*3/uL — ABNORMAL HIGH (ref 4.0–10.5)
WBC: 15 10*3/uL — ABNORMAL HIGH (ref 4.0–10.5)
nRBC: 0 % (ref 0.0–0.2)
nRBC: 0 % (ref 0.0–0.2)

## 2022-11-26 LAB — BPAM RBC
Blood Product Expiration Date: 202312242359
Blood Product Expiration Date: 202312242359
ISSUE DATE / TIME: 202312010833
ISSUE DATE / TIME: 202312010833
Unit Type and Rh: 6200
Unit Type and Rh: 6200

## 2022-11-26 LAB — URINALYSIS, ROUTINE W REFLEX MICROSCOPIC
Bilirubin Urine: NEGATIVE
Glucose, UA: NEGATIVE mg/dL
Ketones, ur: 5 mg/dL — AB
Nitrite: NEGATIVE
Protein, ur: 30 mg/dL — AB
Specific Gravity, Urine: 1.03 (ref 1.005–1.030)
pH: 5 (ref 5.0–8.0)

## 2022-11-26 LAB — PHOSPHORUS: Phosphorus: 3.3 mg/dL (ref 2.5–4.6)

## 2022-11-26 LAB — MAGNESIUM: Magnesium: 2.3 mg/dL (ref 1.7–2.4)

## 2022-11-26 MED ORDER — DOCUSATE SODIUM 100 MG PO CAPS
100.0000 mg | ORAL_CAPSULE | Freq: Two times a day (BID) | ORAL | Status: DC
  Administered 2022-11-27 – 2022-12-06 (×18): 100 mg via ORAL
  Filled 2022-11-26 (×21): qty 1

## 2022-11-26 MED ORDER — POLYETHYLENE GLYCOL 3350 17 G PO PACK
17.0000 g | PACK | Freq: Every day | ORAL | Status: DC
  Administered 2022-11-29 – 2022-12-06 (×2): 17 g via ORAL
  Filled 2022-11-26 (×9): qty 1

## 2022-11-26 NOTE — Progress Notes (Signed)
4 Days Post-Op   Subjective/Chief Complaint: States no improvement from yesterday - still bloated and nauseas. Still having abdominal pain which is the worst at night. No emesis. Sipping on clears mostly just with meds  Objective: Vital signs in last 24 hours: Temp:  [98.6 F (37 C)-99.3 F (37.4 C)] 98.6 F (37 C) (12/05 0621) Pulse Rate:  [96-102] 96 (12/05 0621) Resp:  [19-20] 20 (12/05 0621) BP: (134-142)/(78-83) 142/83 (12/05 0621) SpO2:  [97 %-99 %] 99 % (12/05 0621) Last BM Date : 11/22/22  Intake/Output from previous day: 12/04 0701 - 12/05 0700 In: 397.4 [P.O.:180; IV Piggyback:217.4] Out: 725 [Urine:725] Intake/Output this shift: No intake/output data recorded.  Exam: Awake and alert Abdomen morbidly obese, soft, incisions clean, small amount of air and SS drainage in ostomy bag  Lab Results:  Recent Labs    11/24/22 0036 11/25/22 0210  WBC 16.8* 18.9*  HGB 8.1* 7.7*  HCT 28.0* 27.3*  PLT 151 198    BMET Recent Labs    11/24/22 0036 11/25/22 0210  NA 145 141  K 3.5 3.8  CL 113* 113*  CO2 25 24  GLUCOSE 123* 92  BUN 13 11  CREATININE 0.62 0.57  CALCIUM 7.8* 7.9*    PT/INR No results for input(s): "LABPROT", "INR" in the last 72 hours. ABG No results for input(s): "PHART", "HCO3" in the last 72 hours.  Invalid input(s): "PCO2", "PO2"   Studies/Results: No results found.  Anti-infectives: Anti-infectives (From admission, onward)    Start     Dose/Rate Route Frequency Ordered Stop   11/22/22 0600  cefoTEtan (CEFOTAN) 2 g in sodium chloride 0.9 % 100 mL IVPB        2 g 200 mL/hr over 30 Minutes Intravenous On call to O.R. 11/21/22 1427 11/22/22 1014   11/21/22 2200  neomycin (MYCIFRADIN) tablet 1,000 mg  Status:  Discontinued       See Hyperspace for full Linked Orders Report.   1,000 mg Oral 3 times per day 11/21/22 1427 11/21/22 1733   11/21/22 2200  metroNIDAZOLE (FLAGYL) tablet 1,000 mg  Status:  Discontinued       See Hyperspace  for full Linked Orders Report.   1,000 mg Oral 3 times per day 11/21/22 1427 11/21/22 1733   11/21/22 2200  metroNIDAZOLE (FLAGYL) tablet 1,000 mg       See Hyperspace for full Linked Orders Report.   1,000 mg Oral  Once 11/21/22 1731 11/21/22 2120   11/21/22 2200  neomycin (MYCIFRADIN) tablet 1,000 mg       See Hyperspace for full Linked Orders Report.   1,000 mg Oral  Once 11/21/22 1731 11/21/22 2231   11/21/22 1900  metroNIDAZOLE (FLAGYL) tablet 1,000 mg       See Hyperspace for full Linked Orders Report.   1,000 mg Oral  Once 11/21/22 1731 11/21/22 1918   11/21/22 1900  neomycin (MYCIFRADIN) tablet 1,000 mg       See Hyperspace for full Linked Orders Report.   1,000 mg Oral  Once 11/21/22 1731 11/21/22 1918   11/21/22 1800  metroNIDAZOLE (FLAGYL) tablet 1,000 mg       See Hyperspace for full Linked Orders Report.   1,000 mg Oral  Once 11/21/22 1731 11/21/22 1753   11/21/22 1800  neomycin (MYCIFRADIN) tablet 1,000 mg       See Hyperspace for full Linked Orders Report.   1,000 mg Oral  Once 11/21/22 1731 11/21/22 1753   11/14/22 1545  cefTRIAXone (ROCEPHIN)  1 g in sodium chloride 0.9 % 100 mL IVPB        1 g 200 mL/hr over 30 Minutes Intravenous  Once 11/14/22 1539 11/15/22 0700       Assessment/Plan: POD 4 s/p  laparoscopic right colectomy with ileo-colic anastomosis, laparoscopic sigmoid resection with end colostomy, skin biopsy  - c scope bx with adenocarcinoma. Surgical path pending. Punch bx pending - WBC up to 18.9 yesterday - afebrile. Am labs have not been drawn yet today. Continue to trend and can consider CT later this week if still elevated. She had been on steroids - CEA 51.3, CT CH without nodules, enlarged b/l axillary lymph nodes. Onc has been consulted - Hgb down to 7.7 yesterday - repeat pending - likely post op ileus after the above surgery- stay on sips of clears for now and consider NPO and NGT if emesis develops - consider parenteral nutrition if not improving  in next few days  FEN: CLD ID: [id number] periop VTE: lovenox  Per primary Rash s/p punch bx HTN Electrolyte abnormalities Paraplegia Obesity Thrombocytopenia    LOS: 12 days   Winferd Humphrey, [facility] Surgery 11/26/2022, 8:05 AM Please see Amion for pager number during day hours 7:00am-4:30pm

## 2022-11-26 NOTE — Progress Notes (Signed)
PROGRESS NOTE    [facility]  ZMO:[id number] DOB: 04/20/1971 DOA: 11/14/2022 PCP: [facility]   Brief Narrative:  The Patient is a 51 year old female history of paraplegia as a result of laminectomy, history of chronic anemia, morbid obesity, hypertension, constipation presented with a rash ongoing x 2 weeks which had developed on her anterior trunk and extremities and abdomen started on doxycycline and treated with steroids however no significant improvement.  Patient noted not to have any mucosal involvement or rash of the palms of the hands and the feet no involvement of the posterior body.  Patient also noted to have a history of drug-induced constipation.  Patient noted on 10/30/2022 to be started on Bentyl however reported rash started on her legs progressed up to 2 days after starting Bentyl and using steroids only made rash worse and spread with a burning and itching sensation.  Patient noted to be followed by GI for chronic anemia and was to undergo endoscopy in the OR unfortunately developed rash and unable to get this done.  Patient presented to the ED, noted to have a significant anemia with a hemoglobin of 6.8 and admitted.  Patient transfused 2 units packed red blood cells.  Posttransfusion CBC pending.  CT abdomen and pelvis done due to concern for ileus with circumferential irregular thickening of the ascending colon proximal to the hepatic flexure, small mesenteric lymph nodes findings concerning for colonic neoplasm, second potential site of thickening in the sigmoid also raising concern for colorectal neoplasm, significant fecal impaction, left ureteral calculus/calculi with mild fullness of the left ureter stable appearance of intra renal calculi on the left since previous imaging, signs of body wall edema.  GI consulted for further evaluation.  Patient for colonoscopy 11/19/2022.  ID also consulted for evaluation of rash.    Colonoscopy done after CT scan of the  abdomen pelvis shows circumferential irregular thickening of the ascending colon proximal to the hepatic flexure as well as small mesenteric lymph nodes and a second potential site of thickening in the sigmoid colon.  Colonoscopy was done which showed 2 masses at the hepatic flexure and sigmoid colon biopsy was obtained and results are still pending.  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  Oncology has discussed the role of adjuvant chemotherapy for stage III colon cancer and some high risk stage II disease and she does not have high risk disease she may be a oral chemo candidate with capecitabine due to her paraplegia she would not be a candidate for intravenous chemo.   Patient was taken to the OR on 11/22/22 for a laparoscopic right colectomy with ileocolonic anastomosis, laparoscopic sigmoid resection with end colostomy and skin biopsy and was seen in the PACU and was somnolent and drowsy after receiving anesthesia.   On 12/2 through ongoing today she is awake but she is in pain and appeared a little withdrawn.  Thinks her rash is improving.  Surgery has placed her on clear liquid diet   She is doing okay and tolerating sips of clears for now but surgery wants her to try and get up and out of bed.  Still complain about some significant pain but ostomy is working.  She has some abdominal distention and bloating so she was given Gas-X and General Surgery recommending current plan of care and gave her a bowel regimen and acetaminophen  but she is refusing these.  Surgery is considering parenteral  nutrition if not improving in the next few days   Assessment and Plan:  Diffuse Rash improving but now she is complaining about some itching -Patient noted to have developed a rash for the past 2 weeks which have worsened, rash with bullous changes and  per ID concern for erythema multiforme with no severe sloughing or Nikolsky sign noted. -Patient with no mucosal involvement and no prior HSV infection. -HIV negative.  RPR nonreactive.  Respiratory viral PCR negative. -Mycoplasma IgM <770.  -ID recommending general surgery evaluation for punch biopsy and will be done  -ID feels could be possibly related to Bentyl. -Also concern for possible paraneoplastic rash. -ID recommending calamine lotion for itching and will need outpatient follow-up with dermatology. 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 -FOBT negative. -Hemoglobin noted at 6.8 on admission (11/14/2022). -Status posttransfusion 2 units packed red blood cells,  -Anemia panel with iron level of 12, ferritin of 3, folate of 4.6. -CT abdomen and pelvis with circumferential irregular thickening of the ascending colon proximal to the hepatic flexure with adjacent small mesenteric lymph nodes concerning for colonic neoplasm, potential second site in the sigmoid also raising concern for additional colorectal neoplasm of the mid sigmoid colon less likely colitis.  Fecal impaction also noted. -GI consulted for further evaluation and patient started on a bowel prep for colonoscopy.  Status post 2 to 3 days bowel prep which has been completed and patient underwent colonoscopy which showed an ulcerated  nonobstructing medium-sized mass found in the rectosigmoid colon where the mass was noncircumferential and oozing was present.  This was biopsied with a cold forceps for histology.  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 Gurney Maxin; Follow up Pathology on Masses  -She is having a full workup and underwent a CT of the chest without contrast today which showed "No worrisome pulmonary lesions or pulmonary nodules to suggest metastatic disease. Enlarged bilateral axillary lymph nodes are indeterminate. Abnormal appearance of the sternal manubrium. Could not exclude a permeative neoplastic process. Recommend correlation any sternal pain or tenderness. MRI may be helpful for further evaluation. Postoperative changes involving the thoracic spine with previouslaminectomies and benign-appearing postoperative fluid collection in the subcutaneous fat." -Hemoglobin/Hematocrit has gone from 10.3/35.8 -> 9.2/31.5 -> 9.1/31.2 -> 8.5/29.7 -> 7.9/27.4 ->  8.5/25.0 -> 9.2/27.0 -> 9.0/30.8 -> 8.1/28.0 -> 7.7/27.3 -> 8.3/28.7 -She had a central venous catheter for her surgery but will remove given that she still has a PICC line is functioning well -Follow H&H, transfusion threshold hemoglobin < 7. 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  -Oncology has discussed the role of adjuvant chemotherapy for stage III colon cancer and some high risk stage II disease and she does not have high risk disease she may be a oral chemo candidate with capecitabine due to her paraplegia she would not be a candidate for intravenous chemo. -Surgery recommending continuing to keep her on clears unless emesis develops and recommended trying to get her up and out of bed she continues to have some nausea and bloating today and so having no bowel movements but has flatus in her ostomy bag.  She was given Gas-X for her abdominal bloating -Surgery feels that she may have a postop ileus and recommends continuing clear liquid diet for now considering n.p.o. and NGT if she continues to have emesis -Surgery feels that she may need parenteral nutrition if she is not improving in next few days and recommending staying on sips of clears.  Patient has been given Tylenol on bowel regimen but she has been refusing   Hypokalemia -Secondary to GI losses.   -Potassium is now 3.6 -Magnesium is now 2.3 -Continue potassium repletion and magnesium monitoring -Repeat CMP in the a.m.   Hypophosphatemia -Patient's Phos Level is now 3.3 -Continue to Monitor and Replete as Necessary -Repeat CMP in the AM   Hypoalbuminemia -Patient's albumin level has gone from 2.8 -> 3.0 -> 2.6 -> 2.3 -> 2.4 -Continue monitor and trend and repeat CMP in a.m.   Constipation -Likely drug-induced. -Disimpaction ordered initially on admission.. -Patient was placed on Dulcolax suppository and Senokot as also received a smog enema.   -Status post bowel prep for in anticipation of colonoscopy .  -Post colonoscopy will likely need to be placed on a bowel regimen and surgery has been on a bowel regimen but she has been refusing -GI was  following and signed off and she now has an OSTOMY   Hypertension -BP currently controlled off antihypertensive medications. -Continue to Monitor BP per Protocol -Last BP reading was 134/83   Super Morbid Obesity -Complicates overall prognosis and care -Estimated body mass index is 68.28 kg/m as calculated from the following:   Height as of this encounter: '5\' 6"'$  (1.676 m).   Weight as of this encounter: 191.9 kg.  -Weight Loss and Dietary Counseling given and will need continual Lifestyle modification -Outpatient follow-up with PCP.   Leukocytosis, worsening  -With a worsening leukocytosis initially but trending back down.  As WBC has gone from 20.8 -> 25.8 -> 18.5 -> 18.0 -> 14.3 -> 10.4 -> 15.7 -> 16.8 -> 18.9 and likely reactive in the setting of Surgery and Steroid Demargination and is now improving slowly and is 16.3 -Currently afebrile. -Repeat UA with trace leukocytes, negative nitrites, 11-20 WBCs.   -Urine cultures with 10,000 colonies of Enterococcus faecalis.   -Blood cultures pending with no growth to date.  -Respiratory viral panel negative. -Chest x-ray with no acute abnormalities.. -Patient nontoxic-appearing. -Monitor off antibiotics at this time and repeat labs in the AM.   Thrombocytopenia -The patient's platelet count dropped from 358 -> 336 -> 203 ->  158 -> 149 -> 142 -> 163 -> 156 -> 151 -> 198 -> 268 -Continue to monitor for signs and symptoms of bleeding; no overt bleeding noted -Repeat CBC in a.m.   Paraplegia -As a result of a laminectomy -Continue supportive care   DVT prophylaxis: enoxaparin (LOVENOX) injection 40 mg Start: 11/23/22 0800 SCD's Start: 11/22/22 1741 Place TED hose Start: 11/22/22 1741 SCDs Start: 11/14/22 2005    Code Status: Full Code Family Communication: No family currently at bedside  Disposition Plan:  Level of care: Progressive Status is: Inpatient Remains inpatient appropriate because: His further clinical improvement and  clearance by surgery.  Surgery feels that if she is on for the next few days that she will need parenteral nutrition   Consultants:  Gastroenterology General Surgery Medical Oncology   Procedures:  As delineated as above   Patient underwent laparoscopic right colectomy with ileocolonic anastomosis, laparoscopic sigmoid resection with end colostomy and a skin biopsy   Antimicrobials:  Anti-infectives (From admission, onward)    Start     Dose/Rate Route Frequency Ordered Stop   11/22/22 0600  cefoTEtan (CEFOTAN) 2 g in sodium chloride 0.9 % 100 mL IVPB        2 g 200 mL/hr over 30 Minutes Intravenous On call to O.R. 11/21/22 1427 11/22/22 1014   11/21/22 2200  neomycin (MYCIFRADIN) tablet 1,000 mg  Status:  Discontinued       See Hyperspace for full Linked Orders Report.   1,000 mg Oral 3 times per day 11/21/22 1427 11/21/22 1733   11/21/22 2200  metroNIDAZOLE (FLAGYL) tablet 1,000 mg  Status:  Discontinued       See Hyperspace for full Linked Orders Report.   1,000 mg Oral 3 times per day 11/21/22 1427 11/21/22 1733   11/21/22 2200  metroNIDAZOLE (FLAGYL) tablet 1,000 mg       See Hyperspace for full Linked Orders Report.   1,000 mg Oral  Once 11/21/22 1731 11/21/22 2120   11/21/22 2200  neomycin (MYCIFRADIN) tablet 1,000 mg       See Hyperspace for full Linked Orders Report.   1,000 mg Oral  Once 11/21/22 1731 11/21/22 2231   11/21/22 1900  metroNIDAZOLE (FLAGYL) tablet 1,000 mg       See Hyperspace for full Linked Orders Report.   1,000 mg Oral  Once 11/21/22 1731 11/21/22 1918   11/21/22 1900  neomycin (MYCIFRADIN) tablet 1,000 mg       See Hyperspace for full Linked Orders Report.   1,000 mg Oral  Once 11/21/22 1731 11/21/22 1918   11/21/22 1800  metroNIDAZOLE (FLAGYL) tablet 1,000 mg       See Hyperspace for full Linked Orders Report.   1,000 mg Oral  Once 11/21/22 1731 11/21/22 1753   11/21/22 1800  neomycin (MYCIFRADIN) tablet 1,000 mg       See Hyperspace for full  Linked Orders Report.   1,000 mg Oral  Once 11/21/22 1731 11/21/22 1753   11/14/22 1545  cefTRIAXone (ROCEPHIN) 1 g in sodium chloride 0.9 % 100 mL IVPB        1 g 200 mL/hr over 30 Minutes Intravenous  Once 11/14/22 1539 11/15/22 0700       Subjective: Seen and examined at bedside and the patient was feeling a little depressed but now complaining of some itching coming back.  She is not as much.  Still does not feel as well and continues to have significant pain but apparently has refused her  bowel regimen and acetaminophen.  Objective: Vitals:   11/25/22 1210 11/25/22 2158 11/26/22 0621 11/26/22 1228  BP: 134/78 136/80 (!) 142/83 134/83  Pulse: 97 (!) 102 96 (!) 101  Resp: '20 19 20 19  '$ Temp: 99 F (37.2 C) 99.3 F (37.4 C) 98.6 F (37 C) 97.6 F (36.4 C)  TempSrc: Oral Oral Oral Oral  SpO2: 99% 97% 99% 100%  Weight:      Height:        Intake/Output Summary (Last 24 hours) at 11/26/2022 1638 Last data filed at 11/26/2022 2992 Gross per 24 hour  Intake 319.03 ml  Output 725 ml  Net -405.97 ml   Filed Weights   11/23/22 0500 11/24/22 0500 11/25/22 0500  Weight: (!) 190.5 kg (!) 189.7 kg (!) 191.9 kg   Examination: Physical Exam:  Constitutional: WN/WD super morbidly obese African-American female in no acute distress appears withdrawn and little depressed appearing Respiratory: Diminished to auscultation bilaterally, no wheezing, rales, rhonchi or crackles. Normal respiratory effort and patient is not tachypenic. No accessory muscle use.  Unlabored breathing Cardiovascular: RRR, no murmurs / rubs / gallops. S1 and S2 auscultated.  Abdomen: Soft, non-tender, distended secondary body habitus and has an ostomy and midline honeycomb dressing. Bowel sounds diminished.  GU: Deferred. Musculoskeletal: No clubbing / cyanosis of digits/nails. No joint deformity upper and lower extremities.  Skin: No rashes, lesions, ulcers on limited skin evaluation but does have some crusted  lesions and some bulla noted and scratch marks for which she has been itching. No induration; Warm and dry.  Neurologic: CN 2-12 grossly intact with no focal deficits. Romberg sign and cerebellar reflexes not assessed.  Psychiatric: Normal judgment and insight. Alert and oriented x 3.  Depressed appearing mood and appropriate affect.   Data Reviewed: I have personally reviewed following labs and imaging studies  CBC: Recent Labs  Lab 11/21/22 0329 11/22/22 0455 11/22/22 1041 11/22/22 1311 11/23/22 0251 11/24/22 0036 11/25/22 0210 11/26/22 0856  WBC 10.4 11.8*  --   --  15.7* 16.8* 18.9* 16.3*  NEUTROABS 9.0* 9.4*  --   --   --  14.8* 14.4* 11.6*  HGB 8.5* 7.9*   < > 9.2* 9.0* 8.1* 7.7* 8.3*  HCT 29.7* 27.4*   < > 27.0* 30.8* 28.0* 27.3* 28.7*  MCV 70.0* 70.8*  --   --  73.3* 74.9* 75.4* 74.7*  PLT 142* 163  --   --  156 151 198 268   < > = values in this interval not displayed.   Basic Metabolic Panel: Recent Labs  Lab 11/21/22 0329 11/22/22 0455 11/22/22 1041 11/22/22 1311 11/23/22 0251 11/24/22 0036 11/25/22 0210 11/26/22 0856  NA 142 141   < > 145 143 145 141 139  K 4.0 3.7   < > 3.5 4.2 3.5 3.8 3.6  CL 112* 112*  --   --  114* 113* 113* 109  CO2 23 24  --   --  '24 25 24 24  '$ GLUCOSE 160* 143*  --   --  128* 123* 92 90  BUN 10 12  --   --  '17 13 11 11  '$ CREATININE 0.72 0.52  --   --  0.69 0.62 0.57 0.49  CALCIUM 8.1* 8.1*  --   --  7.7* 7.8* 7.9* 7.7*  MG 2.2 2.5*  --   --   --  2.2 2.2 2.3  PHOS 2.1* 2.1*  --   --   --  2.7 2.4* 3.3   < > =  values in this interval not displayed.   GFR: Estimated Creatinine Clearance: 150.8 mL/min (by C-G formula based on SCr of 0.49 mg/dL). Liver Function Tests: Recent Labs  Lab 11/21/22 0329 11/22/22 0455 11/24/22 0036 11/25/22 0210 11/26/22 0856  AST 11* 14* 19 13* 13*  ALT '11 12 19 14 12  '$ ALKPHOS 52 46 44 38 44  BILITOT 0.6 0.4 0.6 0.7 0.7  PROT 6.0* 6.2* 5.8* 5.3* 5.6*  ALBUMIN 2.8* 3.0* 2.6* 2.3* 2.4*   No  results for input(s): "LIPASE", "AMYLASE" in the last 168 hours. No results for input(s): "AMMONIA" in the last 168 hours. Coagulation Profile: No results for input(s): "INR", "PROTIME" in the last 168 hours. Cardiac Enzymes: No results for input(s): "CKTOTAL", "CKMB", "CKMBINDEX", "TROPONINI" in the last 168 hours. BNP (last 3 results) No results for input(s): "PROBNP" in the last 8760 hours. HbA1C: No results for input(s): "HGBA1C" in the last 72 hours. CBG: No results for input(s): "GLUCAP" in the last 168 hours. Lipid Profile: No results for input(s): "CHOL", "HDL", "LDLCALC", "TRIG", "CHOLHDL", "LDLDIRECT" in the last 72 hours. Thyroid Function Tests: No results for input(s): "TSH", "T4TOTAL", "FREET4", "T3FREE", "THYROIDAB" in the last 72 hours. Anemia Panel: No results for input(s): "VITAMINB12", "FOLATE", "FERRITIN", "TIBC", "IRON", "RETICCTPCT" in the last 72 hours. Sepsis Labs: No results for input(s): "PROCALCITON", "LATICACIDVEN" in the last 168 hours.  Recent Results (from the past 240 hour(s))  Urine Culture     Status: None   Collection Time: 11/17/22  1:02 PM   Specimen: Urine, Catheterized  Result Value Ref Range Status   Specimen Description   Final    URINE, CATHETERIZED Performed at [facility] [address], [place], [place] [address]    Special Requests   Final    NONE Performed at [facility], [place] [address], [place], [place] [address]    Culture   Final    NO GROWTH Performed at [facility], [place] [address] [place], [place] [address]    Report Status 11/18/2022 FINAL  Final    Radiology Studies: No results found.  Scheduled Meds:  acetaminophen  1,000 mg Oral Q6H   alteplase  2 mg Intracatheter Once   alteplase  2 mg Intracatheter Once   Chlorhexidine Gluconate Cloth  6 each Topical Daily   docusate sodium  100 mg Oral BID   enoxaparin (LOVENOX) injection  40 mg Subcutaneous Q24H   feeding  supplement  237 mL Oral BID BM   folic acid  1 mg Intravenous Daily   gabapentin  600 mg Oral TID   hydrocortisone cream   Topical BID   lidocaine-EPINEPHrine  10 mL Intradermal Once   loratadine  10 mg Oral Daily   nystatin   Topical q12n4p   polyethylene glycol  17 g Oral Daily   Ensure Max Protein  11 oz Oral BID   Continuous Infusions:  famotidine (PEPCID) IV 20 mg (11/26/22 9147)   methocarbamol (ROBAXIN) IV 1,000 mg (11/26/22 1300)    LOS: 12 days   Raiford Noble, DO [facility] Available via Epic secure chat 7am-7pm After these hours, please refer to coverage provider listed on [email or web address] 11/26/2022, 4:38 PM

## 2022-11-26 NOTE — Progress Notes (Signed)
Patient being bathed and when cleaning in Patient's private area washcloths noted have blood bright red. Patient was turned and cleaned and bleeding seems to be coming from around foley catheter. MD made aware and new orders received. VSS

## 2022-11-26 NOTE — Progress Notes (Signed)
New Orders placed by the Surgery PA and RN went to Patient's room and explained new Medications and what these Medications are for. Patient refused these Medications as well PO Tylenol ordered.

## 2022-11-27 DIAGNOSIS — C189 Malignant neoplasm of colon, unspecified: Secondary | ICD-10-CM

## 2022-11-27 DIAGNOSIS — D696 Thrombocytopenia, unspecified: Secondary | ICD-10-CM | POA: Diagnosis present

## 2022-11-27 DIAGNOSIS — K567 Ileus, unspecified: Secondary | ICD-10-CM

## 2022-11-27 DIAGNOSIS — D649 Anemia, unspecified: Secondary | ICD-10-CM | POA: Diagnosis not present

## 2022-11-27 DIAGNOSIS — G8220 Paraplegia, unspecified: Secondary | ICD-10-CM | POA: Diagnosis present

## 2022-11-27 DIAGNOSIS — R21 Rash and other nonspecific skin eruption: Secondary | ICD-10-CM | POA: Diagnosis not present

## 2022-11-27 LAB — PHOSPHORUS: Phosphorus: 2.8 mg/dL (ref 2.5–4.6)

## 2022-11-27 LAB — COMPREHENSIVE METABOLIC PANEL
ALT: 14 U/L (ref 0–44)
AST: 15 U/L (ref 15–41)
Albumin: 1.9 g/dL — ABNORMAL LOW (ref 3.5–5.0)
Alkaline Phosphatase: 39 U/L (ref 38–126)
Anion gap: 8 (ref 5–15)
BUN: 10 mg/dL (ref 6–20)
CO2: 22 mmol/L (ref 22–32)
Calcium: 7.4 mg/dL — ABNORMAL LOW (ref 8.9–10.3)
Chloride: 108 mmol/L (ref 98–111)
Creatinine, Ser: 0.51 mg/dL (ref 0.44–1.00)
GFR, Estimated: >60 mL/min (ref >60)
Glucose, Bld: 90 mg/dL (ref 70–99)
Potassium: 3.4 mmol/L — ABNORMAL LOW (ref 3.5–5.1)
Sodium: 138 mmol/L (ref 135–145)
Total Bilirubin: 0.8 mg/dL (ref 0.3–1.2)
Total Protein: 4.8 g/dL — ABNORMAL LOW (ref 6.5–8.1)

## 2022-11-27 LAB — CBC WITH DIFFERENTIAL/PLATELET
Abs Immature Granulocytes: 0.06 10*3/uL (ref 0.00–0.07)
Basophils Absolute: 0 10*3/uL (ref 0.0–0.1)
Basophils Relative: 0 %
Eosinophils Absolute: 1.7 10*3/uL — ABNORMAL HIGH (ref 0.0–0.5)
Eosinophils Relative: 15 %
HCT: 27.3 % — ABNORMAL LOW (ref 36.0–46.0)
Hemoglobin: 7.8 g/dL — ABNORMAL LOW (ref 12.0–15.0)
Immature Granulocytes: 1 %
Lymphocytes Relative: 16 %
Lymphs Abs: 1.8 10*3/uL (ref 0.7–4.0)
MCH: 21.5 pg — ABNORMAL LOW (ref 26.0–34.0)
MCHC: 28.6 g/dL — ABNORMAL LOW (ref 30.0–36.0)
MCV: 75.2 fL — ABNORMAL LOW (ref 80.0–100.0)
Monocytes Absolute: 0.8 10*3/uL (ref 0.1–1.0)
Monocytes Relative: 7 %
Neutro Abs: 6.9 10*3/uL (ref 1.7–7.7)
Neutrophils Relative %: 61 %
Platelets: 254 10*3/uL (ref 150–400)
RBC: 3.63 MIL/uL — ABNORMAL LOW (ref 3.87–5.11)
WBC: 11.4 10*3/uL — ABNORMAL HIGH (ref 4.0–10.5)
nRBC: 0 % (ref 0.0–0.2)

## 2022-11-27 LAB — MAGNESIUM: Magnesium: 2.1 mg/dL (ref 1.7–2.4)

## 2022-11-27 MED ORDER — POTASSIUM CHLORIDE 10 MEQ/50ML IV SOLN
10.0000 meq | INTRAVENOUS | Status: AC
  Administered 2022-11-27 (×4): 10 meq via INTRAVENOUS
  Filled 2022-11-27 (×4): qty 50

## 2022-11-27 MED ORDER — PREDNISONE 20 MG PO TABS
40.0000 mg | ORAL_TABLET | Freq: Every day | ORAL | Status: DC
  Administered 2022-11-27: 40 mg via ORAL
  Filled 2022-11-27 (×2): qty 2

## 2022-11-27 MED ORDER — FLUCONAZOLE 100 MG PO TABS
200.0000 mg | ORAL_TABLET | Freq: Every day | ORAL | Status: DC
  Administered 2022-11-27 – 2022-12-06 (×10): 200 mg via ORAL
  Filled 2022-11-27 (×10): qty 2

## 2022-11-27 MED ORDER — PROSOURCE PLUS PO LIQD
30.0000 mL | Freq: Two times a day (BID) | ORAL | Status: DC
  Administered 2022-11-27 – 2022-12-04 (×4): 30 mL via ORAL
  Filled 2022-11-27 (×9): qty 30

## 2022-11-27 MED ORDER — TRIAMCINOLONE ACETONIDE 0.1 % EX CREA
TOPICAL_CREAM | Freq: Two times a day (BID) | CUTANEOUS | Status: DC
  Administered 2022-12-05: 1 via TOPICAL
  Filled 2022-11-27: qty 15
  Filled 2022-11-27: qty 30
  Filled 2022-11-27 (×2): qty 15

## 2022-11-27 MED ORDER — ADULT MULTIVITAMIN W/MINERALS CH
1.0000 | ORAL_TABLET | Freq: Every day | ORAL | Status: DC
  Administered 2022-11-27 – 2022-12-06 (×10): 1 via ORAL
  Filled 2022-11-27 (×10): qty 1

## 2022-11-27 MED ORDER — METHOCARBAMOL 500 MG PO TABS
1000.0000 mg | ORAL_TABLET | Freq: Four times a day (QID) | ORAL | Status: DC
  Administered 2022-11-27 (×2): 1000 mg via ORAL
  Filled 2022-11-27 (×2): qty 2

## 2022-11-27 NOTE — Assessment & Plan Note (Addendum)
Improving Thought to be secondary to dcyclomine  Transitioning from intravenous Solu-Medrol to oral  Patient would benefit from dermatology follow-up after discharge if transportation allows.

## 2022-11-27 NOTE — Progress Notes (Signed)
Progress Note    [facility]   WPY:[id number]  DOB: [birth year]  DOA: 11/14/2022     13 PCP: [facility]  Initial CC: rash  Hospital Course: [name] is a 51 yo female with PMH paraplegia 2/2 laminectomy, anemia, morbid obesity, HTN, constipation.  She originally presented to the hospital due to an ongoing and worsening rash that started at home approximately 1 month prior to admission.  The only associated new medicine was having started dicyclomine.  After approximately 3 days of the medication, she stopped it however states that the rash has continued to worsen and progress. She also endorsed trial of steroids with no improvement.  The rash is pruritic in nature.  On workup, she was also found to be anemic, hemoglobin 6.8 g/dL.  CT abdomen/pelvis was concerning for irregular thickening along the ascending colon and also a second site of thickening in the sigmoid colon. She underwent evaluation by GI and ultimately underwent colonoscopy on 11/19/2022.  Biopsies of ascending colon and sigmoid colon from colonoscopy returned positive with moderately differentiated adenocarcinoma and fragments of adenoma with high-grade dysplasia focally approaching intramucosal adenocarcinoma for the sigmoid colon biopsy. General surgery was consulted and she underwent laparoscopic right colectomy with ileocolic anastomosis and laparoscopic sigmoid resection with end colostomy.  Also underwent skin biopsy.  Interval History:  No events overnight.  Patient seen in bed later this afternoon.  Reviewed biopsy results with patient along with skin biopsy results.  She still endorses ongoing rash and seems to be frustrated with lack of response to treatment tried thus far. Mild nausea, no vomiting. Seems to be tolerating diet.  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[place]) - CT concerning for colon cancer on admission - Colonoscopy 11/28 followed by surgery on 12/1 -Pathology from  surgery specimens has returned with moderately differentiated adenocarcinoma involving right colon/terminal ileum, sigmoid colon, colon/rectum -Pericolic adipose tissue also invaded per pathology results; will discuss with oncology -Ostomy bag starting to function - General surgery following - Diet being advanced as tolerated  Ileus ([place]) - Ostomy now showing function - Appreciate surgery assistance - Continue diet as per surgery  Bullous rash - Patient presented with approximately 1 month history of rash prior to hospitalization with associated pruritus and weeping blisters.  No lesions involving mouth -Skin biopsy obtained on 11/22/2022 showing mild superficial perivascular dermatitis with eosinophils - initial precipitating factor thought to be dicyclomine at onset but has seemed to progress despite stopping med after ~3 days of use - s/p intermittent use of hydrocortisone cream (not routine enough), claritin, nystatin powder, pepcid, atarax and she still hasn't shown much response - biopsy 12/1 noted with: mild superficial perivascular dermatitis with eosinophils - given appearance and timeline, still higher suspicion of drug reaction (esp given the bullae formation). She may have delayed response to treatment due to underlying malignancy and immunocompromised state - has had enough of above treatment thus far; transition to trial of triamcinolone (needs to be placed BID, not intermittently), start prednisone, and will try fluconazole in case of component of some underlying tinea  Symptomatic anemia - Initially presented with hemoglobin 6.8 g/dL on 11/14/2022 - She has received 3 units PRBC thus far during hospitalization - Etiology suspected due to underlying slow blood loss from adenocarcinoma - Continue trending CBC  Paraplegia ([place]) - Due to history of laminectomy - Bedbound - Home health aide for care at home  Thrombocytopenia [facility]) - Known history dating back to at least  2020 -Platelets have  been mostly normal during hospitalization - Continue trending CBC  Hypophosphatemia - Replete as needed  Constipation - Now has end ostomy in place  Hypokalemia - Replete as needed  Essential hypertension - Currently controlled without medications - Continue monitoring  Leukocytosis - multifactorial likely from reactive nature and/or some demargination from steroid use; no overt signs of infection - continue trending CBC  Morbid obesity with body mass index (BMI) of 60.0 to 69.9 in adult [facility]) - Complicates overall prognosis and care - Body mass index is 65.58 kg/m.   Old records reviewed in assessment of this patient  Antimicrobials:   DVT prophylaxis:  enoxaparin (LOVENOX) injection 40 mg Start: 11/23/22 0800 SCD's Start: 11/22/22 1741 Place TED hose Start: 11/22/22 1741 SCDs Start: 11/14/22 2005   Code Status:   Code Status: Full Code  Mobility Assessment (last 72 hours)     Mobility Assessment     Row Name 11/27/22 1550 11/27/22 1452 11/27/22 1000 11/26/22 2100 11/26/22 0800   Does patient have an order for bedrest or is patient medically unstable -- -- No - Continue assessment No - Continue assessment No - Continue assessment   What is the highest level of mobility based on the progressive mobility assessment? Level 1 (Bedfast) - Unable to balance while sitting on edge of bed Level 1 (Bedfast) - Unable to balance while sitting on edge of bed Level 1 (Bedfast) - Unable to balance while sitting on edge of bed Level 1 (Bedfast) - Unable to balance while sitting on edge of bed Level 2 (Chairfast) - Balance while sitting on edge of bed and cannot stand   Is the above level different from baseline mobility prior to current illness? -- -- Yes - Recommend PT order Yes - Recommend PT order Yes - Recommend PT order    Row Name 11/25/22 2200 11/25/22 1606 11/25/22 1558 11/24/22 2030     Does patient have an order for bedrest or is patient medically  unstable No - Continue assessment -- -- Yes- Bedfast (Level 1) - Complete    What is the highest level of mobility based on the progressive mobility assessment? Level 2 (Chairfast) - Balance while sitting on edge of bed and cannot stand Level 2 (Chairfast) - Balance while sitting on edge of bed and cannot stand Level 2 (Chairfast) - Balance while sitting on edge of bed and cannot stand Level 1 (Bedfast) - Unable to balance while sitting on edge of bed    Is the above level different from baseline mobility prior to current illness? Yes - Recommend PT order -- -- Yes - Recommend PT order             Barriers to discharge:  Disposition Plan:  Home Status is: Inpt  Objective: Blood pressure 121/70, pulse (!) 102, temperature 98.1 F (36.7 C), temperature source Oral, resp. rate 20, height '5\' 6"'$  (1.676 m), weight (!) 184.3 kg, SpO2 99 %.  Examination:  Physical Exam Constitutional:      Appearance: Normal appearance. She is obese.  HENT:     Head: Normocephalic and atraumatic.     Mouth/Throat:     Mouth: Mucous membranes are moist.     Comments: No lesions appreciated Eyes:     Extraocular Movements: Extraocular movements intact.  Neck:     Comments: Small bulla noted on neck with weeping Cardiovascular:     Rate and Rhythm: Normal rate and regular rhythm.  Pulmonary:     Effort: Pulmonary effort is normal.  No respiratory distress.     Breath sounds: Normal breath sounds.  Abdominal:     Palpations: Abdomen is soft.     Comments: Ostomy bag in place with stool noted  Musculoskeletal:     Comments: Chronic edema notably in lower extremities  Skin:    Findings: Rash present.     Comments: Scattered areas with hyperpigmented skin with desiccated and peeling skin superficially; no erythema. There are scattered bullae throughout skin too with serous weeping  Neurological:     Mental Status: She is alert.     Comments: Chronic paraplegia at baseline  Psychiatric:        Mood and  Affect: Mood normal.      Consultants:  General surgery GI  Procedures:  11/19/22: Colonoscopy 12/1:Laparoscopic right colectomy with ileo-colic anastomosis, laparoscopic sigmoid resection with end colostomy, skin biopsy   Data Reviewed: Results for orders placed or performed during the hospital encounter of 11/14/22 (from the past 24 hour(s))  Urinalysis, Routine w reflex microscopic Urine, Catheterized     Status: Abnormal   Collection Time: 11/26/22  6:08 PM  Result Value Ref Range   Color, Urine YELLOW YELLOW   APPearance HAZY (A) CLEAR   Specific Gravity, Urine 1.030 1.005 - 1.030   pH 5.0 5.0 - 8.0   Glucose, UA NEGATIVE NEGATIVE mg/dL   Hgb urine dipstick SMALL (A) NEGATIVE   Bilirubin Urine NEGATIVE NEGATIVE   Ketones, ur 5 (A) NEGATIVE mg/dL   Protein, ur 30 (A) NEGATIVE mg/dL   Nitrite NEGATIVE NEGATIVE   Leukocytes,Ua SMALL (A) NEGATIVE   RBC / HPF 11-20 0 - 5 RBC/hpf   WBC, UA 21-50 0 - 5 WBC/hpf   Bacteria, UA RARE (A) NONE SEEN   Squamous Epithelial / LPF 11-20 0 - 5   Mucus PRESENT   CBC with Differential/Platelet     Status: Abnormal   Collection Time: 11/26/22  9:37 PM  Result Value Ref Range   WBC 15.0 (H) 4.0 - 10.5 K/uL   RBC 3.91 3.87 - 5.11 MIL/uL   Hemoglobin 8.5 (L) 12.0 - 15.0 g/dL   HCT 29.2 (L) 36.0 - 46.0 %   MCV 74.7 (L) 80.0 - 100.0 fL   MCH 21.7 (L) 26.0 - 34.0 pg   MCHC 29.1 (L) 30.0 - 36.0 g/dL   RDW Not Measured 11.5 - 15.5 %   Platelets 299 150 - 400 K/uL   nRBC 0.0 0.0 - 0.2 %   Neutrophils Relative % 65 %   Neutro Abs 9.6 (H) 1.7 - 7.7 K/uL   Lymphocytes Relative 12 %   Lymphs Abs 1.8 0.7 - 4.0 K/uL   Monocytes Relative 7 %   Monocytes Absolute 1.1 (H) 0.1 - 1.0 K/uL   Eosinophils Relative 16 %   Eosinophils Absolute 2.5 (H) 0.0 - 0.5 K/uL   Basophils Relative 0 %   Basophils Absolute 0.0 0.0 - 0.1 K/uL   Immature Granulocytes 0 %   Abs Immature Granulocytes 0.05 0.00 - 0.07 K/uL   Dimorphism PRESENT    Polychromasia  PRESENT    Target Cells PRESENT    Ovalocytes PRESENT   CBC with Differential/Platelet     Status: Abnormal   Collection Time: 11/27/22  4:01 AM  Result Value Ref Range   WBC 11.4 (H) 4.0 - 10.5 K/uL   RBC 3.63 (L) 3.87 - 5.11 MIL/uL   Hemoglobin 7.8 (L) 12.0 - 15.0 g/dL   HCT 27.3 (L) 36.0 - 46.0 %  MCV 75.2 (L) 80.0 - 100.0 fL   MCH 21.5 (L) 26.0 - 34.0 pg   MCHC 28.6 (L) 30.0 - 36.0 g/dL   RDW Not Measured 11.5 - 15.5 %   Platelets 254 150 - 400 K/uL   nRBC 0.0 0.0 - 0.2 %   Neutrophils Relative % 61 %   Neutro Abs 6.9 1.7 - 7.7 K/uL   Lymphocytes Relative 16 %   Lymphs Abs 1.8 0.7 - 4.0 K/uL   Monocytes Relative 7 %   Monocytes Absolute 0.8 0.1 - 1.0 K/uL   Eosinophils Relative 15 %   Eosinophils Absolute 1.7 (H) 0.0 - 0.5 K/uL   Basophils Relative 0 %   Basophils Absolute 0.0 0.0 - 0.1 K/uL   Immature Granulocytes 1 %   Abs Immature Granulocytes 0.06 0.00 - 0.07 K/uL   Polychromasia PRESENT    Target Cells PRESENT    Ovalocytes PRESENT   Comprehensive metabolic panel     Status: Abnormal   Collection Time: 11/27/22  4:01 AM  Result Value Ref Range   Sodium 138 135 - 145 mmol/L   Potassium 3.4 (L) 3.5 - 5.1 mmol/L   Chloride 108 98 - 111 mmol/L   CO2 22 22 - 32 mmol/L   Glucose, Bld 90 70 - 99 mg/dL   BUN 10 6 - 20 mg/dL   Creatinine, Ser 0.51 0.44 - 1.00 mg/dL   Calcium 7.4 (L) 8.9 - 10.3 mg/dL   Total Protein 4.8 (L) 6.5 - 8.1 g/dL   Albumin 1.9 (L) 3.5 - 5.0 g/dL   AST 15 15 - 41 U/L   ALT 14 0 - 44 U/L   Alkaline Phosphatase 39 38 - 126 U/L   Total Bilirubin 0.8 0.3 - 1.2 mg/dL   GFR, Estimated >60 >60 mL/min   Anion gap 8 5 - 15  Phosphorus     Status: None   Collection Time: 11/27/22  4:01 AM  Result Value Ref Range   Phosphorus 2.8 2.5 - 4.6 mg/dL  Magnesium     Status: None   Collection Time: 11/27/22  4:01 AM  Result Value Ref Range   Magnesium 2.1 1.7 - 2.4 mg/dL    I have Reviewed nursing notes, Vitals, and Lab results since pt's last  encounter. Pertinent lab results : see above I have ordered test including BMP, CBC, Mg I have reviewed the last note from staff over past 24 hours I have discussed pt's care plan and test results with nursing staff, case manager  Time spent: Greater than 50% of the 55 minute visit was spent in counseling/coordination of care for the patient as laid out in the A&P.    LOS: 13 days   Dwyane Dee, MD [facility] 11/27/2022, 4:45 PM

## 2022-11-27 NOTE — Assessment & Plan Note (Addendum)
History of hypertension although blood pressures have been well-controlled

## 2022-11-27 NOTE — Assessment & Plan Note (Signed)
-   Ostomy now showing function - Appreciate surgery assistance - Continue diet as per surgery

## 2022-11-27 NOTE — Assessment & Plan Note (Addendum)
Replaced. °

## 2022-11-27 NOTE — Assessment & Plan Note (Addendum)
Counseling patient on caloric restriction  Body mass index is 65.58 kg/m.

## 2022-11-27 NOTE — Assessment & Plan Note (Addendum)
Per oncology and review of pathology, both areas of colon cancer showed T3 lesion and negative margins and lymph nodes.  She had synchronized stage II colon cancer, well to moderately differentiated with no other high risk features.  Oncology has not recommended any adjuvant chemotherapy. While follow-up with oncology is ideal in the outpatient setting, patient reports that this will be difficult due to transportation issues.

## 2022-11-27 NOTE — Progress Notes (Signed)
Progress Note  5 Days Post-Op  Subjective: Pt denies significant abdominal pain. Tolerating CLD and having ostomy output. Denies n/v.   Objective: Vital signs in last 24 hours: Temp:  [97.6 F (36.4 C)-98.4 F (36.9 C)] 98.1 F (36.7 C) (12/06 0453) Pulse Rate:  [101-111] 102 (12/06 0453) Resp:  [18-20] 20 (12/06 0453) BP: (121-134)/(70-83) 121/70 (12/06 0453) SpO2:  [97 %-100 %] 99 % (12/06 0453) Weight:  [184.3 kg] 184.3 kg (12/06 0455) Last BM Date :  (With Colostomy)  Intake/Output from previous day: 12/05 0701 - 12/06 0700 In: 1010 [P.O.:240; IV Piggyback:770] Out: 850 [Urine:675; Stool:175] Intake/Output this shift: No intake/output data recorded.  PE: General: pleasant, WD, obese female, NAD Lungs: Respiratory effort nonlabored Abd: soft, NT, ND, incisions C/D/I, ostomy viable with liquid stool in pouch Psych: A&Ox3 with an appropriate affect.    Lab Results:  Recent Labs    11/26/22 2137 11/27/22 0401  WBC 15.0* 11.4*  HGB 8.5* 7.8*  HCT 29.2* 27.3*  PLT 299 254   BMET Recent Labs    11/26/22 0856 11/27/22 0401  NA 139 138  K 3.6 3.4*  CL 109 108  CO2 24 22  GLUCOSE 90 90  BUN 11 10  CREATININE 0.49 0.51  CALCIUM 7.7* 7.4*   PT/INR No results for input(s): "LABPROT", "INR" in the last 72 hours. CMP     Component Value Date/Time   NA 138 11/27/2022 0401   K 3.4 (L) 11/27/2022 0401   CL 108 11/27/2022 0401   CO2 22 11/27/2022 0401   GLUCOSE 90 11/27/2022 0401   BUN 10 11/27/2022 0401   CREATININE 0.51 11/27/2022 0401   CALCIUM 7.4 (L) 11/27/2022 0401   PROT 4.8 (L) 11/27/2022 0401   ALBUMIN 1.9 (L) 11/27/2022 0401   AST 15 11/27/2022 0401   ALT 14 11/27/2022 0401   ALKPHOS 39 11/27/2022 0401   BILITOT 0.8 11/27/2022 0401   GFRNONAA >60 11/27/2022 0401   GFRAA >60 03/01/2019 0612   Lipase     Component Value Date/Time   LIPASE 27 10/02/2022 2133       Studies/Results: No results  found.  Anti-infectives: Anti-infectives (From admission, onward)    Start     Dose/Rate Route Frequency Ordered Stop   11/22/22 0600  cefoTEtan (CEFOTAN) 2 g in sodium chloride 0.9 % 100 mL IVPB        2 g 200 mL/hr over 30 Minutes Intravenous On call to O.R. 11/21/22 1427 11/22/22 1014   11/21/22 2200  neomycin (MYCIFRADIN) tablet 1,000 mg  Status:  Discontinued       See Hyperspace for full Linked Orders Report.   1,000 mg Oral 3 times per day 11/21/22 1427 11/21/22 1733   11/21/22 2200  metroNIDAZOLE (FLAGYL) tablet 1,000 mg  Status:  Discontinued       See Hyperspace for full Linked Orders Report.   1,000 mg Oral 3 times per day 11/21/22 1427 11/21/22 1733   11/21/22 2200  metroNIDAZOLE (FLAGYL) tablet 1,000 mg       See Hyperspace for full Linked Orders Report.   1,000 mg Oral  Once 11/21/22 1731 11/21/22 2120   11/21/22 2200  neomycin (MYCIFRADIN) tablet 1,000 mg       See Hyperspace for full Linked Orders Report.   1,000 mg Oral  Once 11/21/22 1731 11/21/22 2231   11/21/22 1900  metroNIDAZOLE (FLAGYL) tablet 1,000 mg       See Hyperspace for full Linked Orders Report.  1,000 mg Oral  Once 11/21/22 1731 11/21/22 1918   11/21/22 1900  neomycin (MYCIFRADIN) tablet 1,000 mg       See Hyperspace for full Linked Orders Report.   1,000 mg Oral  Once 11/21/22 1731 11/21/22 1918   11/21/22 1800  metroNIDAZOLE (FLAGYL) tablet 1,000 mg       See Hyperspace for full Linked Orders Report.   1,000 mg Oral  Once 11/21/22 1731 11/21/22 1753   11/21/22 1800  neomycin (MYCIFRADIN) tablet 1,000 mg       See Hyperspace for full Linked Orders Report.   1,000 mg Oral  Once 11/21/22 1731 11/21/22 1753   11/14/22 1545  cefTRIAXone (ROCEPHIN) 1 g in sodium chloride 0.9 % 100 mL IVPB        1 g 200 mL/hr over 30 Minutes Intravenous  Once 11/14/22 1539 11/15/22 0700        Assessment/Plan  POD5 s/p  laparoscopic right colectomy with ileo-colic anastomosis, laparoscopic sigmoid resection with  end colostomy, skin biopsy  - c scope bx with adenocarcinoma. Surgical path pending. Punch bx pending - WBC normalizing  - CEA 51.3, CT CH without nodules, enlarged b/l axillary lymph nodes. Onc has been consulted - Hgb down to 7.8 this AM, stable - having ostomy output, advance to FLD this AM and the ADAT to soft  - stable for discharge from a surgical perspective when tolerating soft diet and comfortable with ostomy care    FEN: FLD and ADAT to soft ID: [email or web address] periop VTE: lovenox   - Per primary attending -  Rash s/p punch bx - path pending  HTN Paraplegia Morbid Obesity Thrombocytopenia, resolved   LOS: 13 days     Norm Parcel, [facility] 11/27/2022, 9:16 AM Please see Amion for pager number during day hours 7:00am-4:30pm

## 2022-11-27 NOTE — Assessment & Plan Note (Signed)
-   Known history dating back to at least 2020 -Platelets have been mostly normal during hospitalization - Continue trending CBC

## 2022-11-27 NOTE — Assessment & Plan Note (Signed)
-   multifactorial likely from reactive nature and/or some demargination from steroid use; no overt signs of infection - continue trending CBC

## 2022-11-27 NOTE — Progress Notes (Signed)
Nutrition Follow-up  DOCUMENTATION CODES:   Not applicable  INTERVENTION:  - Full liquid diet per MD. Advance as tolerated. - Discontinue Ensure Max as patient does not like and has not been consuming.  - Will order Magic Cup once daily (290 kcal, 9g protein) and Mighty Shake once daily (330 kcal, 9g protein) to support intake.  - Will order ProSource Plus BID between meals to support intake, each supplement provides 100 calories and 15g protein.  - Encourage intake as tolerated.  - Will order daily multivitamin to support micronutrient needs.    NUTRITION DIAGNOSIS:   Altered GI function related to constipation (concern for ileus) as evidenced by other (comment) (clear liquid diet). *progressing - full liquid diet  GOAL:   Patient will meet greater than or equal to 90% of their needs *not being met, supplements ordered  MONITOR:   Diet advancement, PO intake  REASON FOR ASSESSMENT:   Consult Assessment of nutrition requirement/status  ASSESSMENT:   51 y.o. female with medical history significant of paraplegia as a result of laminectomy, chronic anemia, morbid obesity, hypertension, constipation admitted for rash and symptomatic anemia  12/1 R colectomy and end colostomy; clear liquid diet 12/6 Advanced to Full liquid diet   Patient reports she has been attempting to eat but has no appetite. No intake documented since 11/30 to assess patient's oral intake.  Patient reports she hates the Ensure Max and Ensure Surgery ordered. Per meds history patient had two Ensure Max between 12/2-12/3 but declined all other times.   Stressed importance of nutrition and trying to eat 3 meals a day to support nutrition needs and healing. Patient agreeable to try supplements other than Ensure/Boost. Will order Magic Cup, Mighty Shake to support intake, especially while patient on full liquid diet.    Medications reviewed and include: Colace, Miralax, Folic Acid,  Labs reviewed:  K+  3.4   Diet Order:   Diet Order             Diet full liquid Room service appropriate? Yes; Fluid consistency: Thin  Diet effective now                   EDUCATION NEEDS:  No education needs have been identified at this time  Skin:  Skin Assessment: Reviewed RN Assessment Skin Integrity Issues:: Other (Comment) Other: MSAD bilateral buttocks  Last BM:  Colostomy  Height:  Ht Readings from Last 1 Encounters:  11/22/22 _0  (1.676 m)   Weight:  Wt Readings from Last 1 Encounters:  11/27/22 (!) 184.3 kg   Ideal Body Weight:  53.2 kg (subtracted 10% for paraplegia)  BMI:  Body mass index is 65.58 kg/m.  Estimated Nutritional Needs:  Kcal:  2250-2400 kcal Protein:  85-95 grams Fluid:  </= 2.2L    Samson Frederic RD, LDN For contact information, refer to [facility].

## 2022-11-27 NOTE — Assessment & Plan Note (Addendum)
Patient initially presented with hemoglobin of 6.8 on 11/23 Patient received 3 units of packed red blood cells transfused throughout the hospitalization. Majority of blood loss anemia thought to be secondary to recently resected adenocarcinoma of the colon Now, it seems that patient is exhibiting some degree of vaginal bleeding Monitoring hemoglobin and hematocrit with serial CBCs Transfuse with additional packed red blood cells if hemoglobin drops less than 7 Will refer to outpatient gynecology for further evaluation and management

## 2022-11-27 NOTE — Progress Notes (Signed)
Occupational Therapy Treatment Patient Details Name: Ellen Hill MRN: [id number] DOB: 1971-09-23 Today's Date: 11/27/2022   History of present illness Patient is a 51 y.o. female admitted 11/14/22 for rash and symptomatic anemia.  Past medical history significant of paraplegia as a result of laminectomy, chronic anemia, morbid obesity, hypertension, constipation, neurogenic bladder, UTI.  Pt s/p laparoscopic right colectomy with ileo-colic anastomosis, laparoscopic sigmoid resection with end colostomy, skin biopsy on 11/22/22.   OT comments  Patient participated in AROM and strengthening exercises for BUE to increase ability to participate in ADLs. Patient is making progress towards short term goals. Patient would continue to benefit from skilled OT services at this time while admitted and after d/c to address noted deficits in order to improve overall safety and independence in ADLs.     Recommendations for follow up therapy are one component of a multi-disciplinary discharge planning process, led by the attending physician.  Recommendations may be updated based on patient status, additional functional criteria and insurance authorization.    Follow Up Recommendations  Home health OT     Assistance Recommended at Discharge Frequent or constant Supervision/Assistance  Patient can return home with the following  Assistance with cooking/housework;A lot of help with bathing/dressing/bathroom   Equipment Recommendations  None recommended by OT    Recommendations for Other Services      Precautions / Restrictions Precautions Precautions: Fall Precaution Comments: R UQ colostomy Restrictions Weight Bearing Restrictions: No           Cognition Arousal/Alertness: Awake/alert Behavior During Therapy: WFL for tasks assessed/performed Overall Cognitive Status: Within Functional Limits for tasks assessed                  Exercises Shoulder Exercises Shoulder Flexion:  AROM, Strengthening, Right, Left, 10 reps (Patient was AROM for RUE and LUE was with yellow theraband) Elbow Flexion: 10 reps, AROM, Strengthening, Theraband Theraband Level (Elbow Flexion): Level 1 (Yellow) Other Exercises Other Exercises: patient was educated on importance of ROM and squeezes to maitnain ROM for participation in ADLs. patient verbalized understanding. patient educated on positioning of BUE to reduce risks of edema.    Shoulder Instructions       General Comments      Pertinent Vitals/ Pain       Pain Assessment Pain Assessment: Faces Faces Pain Scale: Hurts a little bit Pain Location: irritation near R anterior lead sticker. MD made aware who was in room at end of session Pain Intervention(s): Monitored during session  Home Living   Prior Functioning/Environment    Frequency  Min 2X/week        Progress Toward Goals  OT Goals(current goals can now be found in the care plan section)  Progress towards OT goals: Progressing toward goals     Plan Discharge plan remains appropriate       AM-PAC OT "6 Clicks" Daily Activity     Outcome Measure   Help from another person eating meals?: None Help from another person taking care of personal grooming?: None Help from another person toileting, which includes using toliet, bedpan, or urinal?: Total Help from another person bathing (including washing, rinsing, drying)?: A Lot Help from another person to put on and taking off regular upper body clothing?: A Little Help from another person to put on and taking off regular lower body clothing?: Total 6 Click Score: 15    End of Session    OT Visit Diagnosis: Muscle weakness (generalized) (M62.81)   Activity Tolerance Patient  tolerated treatment well   Patient Left in bed;with call bell/phone within reach   Nurse Communication Mobility status        Time: 6712-4580 OT Time Calculation (min): 13 min  Charges: OT General Charges $OT Visit: 1  Visit OT Treatments $Therapeutic Activity: 8-22 mins  Rennie Plowman, MS Acute Rehabilitation Department Office# [phone]   Willa Rough 11/27/2022, 3:52 PM

## 2022-11-27 NOTE — Progress Notes (Signed)
Physical Therapy Treatment Patient Details Name: Ellen Hill MRN: [id number] DOB: 1971/06/20 Today's Date: 11/27/2022   History of Present Illness Patient is a 51 y.o. female admitted 11/14/22 for rash and symptomatic anemia.  Past medical history significant of paraplegia as a result of laminectomy, chronic anemia, morbid obesity, hypertension, constipation, neurogenic bladder, UTI.  Pt s/p laparoscopic right colectomy with ileo-colic anastomosis, laparoscopic sigmoid resection with end colostomy, skin biopsy on 11/22/22.    PT Comments    Pt with bed elevated in semi chair position, verbalizes being in much discomfort and appears to have hips swayed to the R, max A+2 to reposition with bedpad pt into more comfortable position. Once fixed, pt lets out sigh of relief from being uncomfortable, declines to transition to EOB due to being comfortable, but agreeable to exercises. Cued pt on supine exercises, pt unable to feel her LLE moving but able to engage dorsiflexion, plantarflexion, knee flexion and knee extension. Pt performs heel slide with AAROM, therapist assisting LE into more neutral position and out of external rotation. Pt able to engage bil quads for isometric quad set. Pt educated on performing additional sets and reps as able; pt reports difficulty feeling motion but able to visually see muscles activating so educated to remove covers to help mind-body connection.    Recommendations for follow up therapy are one component of a multi-disciplinary discharge planning process, led by the attending physician.  Recommendations may be updated based on patient status, additional functional criteria and insurance authorization.  Follow Up Recommendations  Home health PT     Assistance Recommended at Discharge Intermittent Supervision/Assistance  Patient can return home with the following A little help with walking and/or transfers;Assistance with cooking/housework   Equipment  Recommendations  None recommended by PT    Recommendations for Other Services       Precautions / Restrictions Precautions Precautions: Fall Precaution Comments: R UQ colostomy Restrictions Weight Bearing Restrictions: No     Mobility  Bed Mobility  General bed mobility comments: max A+2 to reposition to comfort in bed, pt declines to sit EOB due to finally getting comfortable and lunch arriving    Transfers     Ambulation/Gait      Stairs             Wheelchair Mobility    Modified Rankin (Stroke Patients Only)       Balance        Cognition Arousal/Alertness: Awake/alert Behavior During Therapy: WFL for tasks assessed/performed Overall Cognitive Status: Within Functional Limits for tasks assessed     Exercises General Exercises - Lower Extremity Quad Sets: Supine, Strengthening, Both, 10 reps Heel Slides: Supine, AAROM, Both, 10 reps Toe Raises: Supine, AROM, Strengthening, Both, 15 reps Heel Raises: Supine, AROM, Strengthening, Both, 15 reps    General Comments        Pertinent Vitals/Pain Pain Assessment Pain Assessment: Faces Faces Pain Scale: Hurts even more Pain Location: abdomen Pain Descriptors / Indicators: Sore, Grimacing Pain Intervention(s): Limited activity within patient's tolerance, Monitored during session, Repositioned    Home Living                          Prior Function            PT Goals (current goals can now be found in the care plan section) Acute Rehab PT Goals PT Goal Formulation: With patient Time For Goal Achievement: 12/06/22 Potential to Achieve Goals: Good Additional Goals Additional  Goal #1: Pt will propel w/c 50 feet with supervision Progress towards PT goals: Progressing toward goals    Frequency    Min 2X/week      PT Plan Current plan remains appropriate    Co-evaluation              AM-PAC PT "6 Clicks" Mobility   Outcome Measure  Help needed turning from your  back to your side while in a flat bed without using bedrails?: A Lot Help needed moving from lying on your back to sitting on the side of a flat bed without using bedrails?: A Lot Help needed moving to and from a bed to a chair (including a wheelchair)?: Total Help needed standing up from a chair using your arms (e.g., wheelchair or bedside chair)?: Total Help needed to walk in hospital room?: Total Help needed climbing 3-5 steps with a railing? : Total 6 Click Score: 8    End of Session   Activity Tolerance: Patient limited by pain Patient left: in bed;with call bell/phone within reach Nurse Communication: Mobility status PT Visit Diagnosis: Muscle weakness (generalized) (M62.81);Other abnormalities of gait and mobility (R26.89)     Time: 7628-3151 PT Time Calculation (min) (ACUTE ONLY): 31 min  Charges:  $Therapeutic Exercise: 8-22 mins $Therapeutic Activity: 8-22 mins                      Tori Inez Stantz PT, DPT 11/27/22, 3:03 PM

## 2022-11-27 NOTE — Hospital Course (Addendum)
Ms. Ellen Hill is a 51 yo female with PMH paraplegia 2/2 laminectomy, anemia, morbid obesity, HTN, constipation.  She originally presented to the hospital due to an ongoing and worsening rash that started at home approximately 1 month prior to admission.    Upon evaluation in the emergency department hospitalist group was called to assess the patient for admission to the hospital for evaluation and treatment of her dicyclomine related rash.    In the days that followed, patient was managed with a combination of both systemic steroids and topical steroids with slow gradual improvement in her rash.  Skin biopsy performed during this hospitalization by Dr. Kieth Brightly on 12/1 revealed mild superficial perivascular dermatitis with eosinophils seemingly supportive of a drug rash.  Also during the hospitalization patient was noted to be anemic with hemoglobin of 6.8.  CT imaging of the abdomen and pelvis revealed irregular thickening along the ascending colon with a second site of thickening of the sigmoid colon.  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  Dr. Kieth Brightly with general surgery was consulted and patient underwent laparoscopic right colectomy with ilio colonic anastomosis and laparoscopic sigmoid resection with end colostomy.  Dr. Annamaria Boots with oncology was consulted and upon review of the surgical pathology findings determined that both areas of colon cancer showed negative margins and lymph nodes.  No adjuvant chemotherapy was recommended.  Estimated recurrence is about 2025% in the next 5 years.  While outpatient follow-up with oncology was recommended, patient stated that outpatient follow-up would be difficult due to her body habitus and difficulty with getting to appointments.  In the days leading up to discharge, the patient began to exhibit some degree of gross vaginal bleeding.  Telemedicine  consultation with Dr. Damita Dunnings with OB/GYN was obtained and Megace was recommended to be initiated.  Attempts were made to obtain a transvaginal ultrasound but were unsuccessful due to patient's body habitus.  OB/GYN recommended outpatient follow-up with Dr. Berline Lopes for further evaluation and treatment.  Arranges were made for the patient to eventually be discharged home.  Arrangements were made for home health services and home-going transportation.  Patient was discharged home in improved and stable condition 12/15.

## 2022-11-27 NOTE — Assessment & Plan Note (Signed)
-   Now has end ostomy in place

## 2022-11-27 NOTE — Assessment & Plan Note (Signed)
-   Due to history of laminectomy - Bedbound - Home health aide for care at home

## 2022-11-27 NOTE — Discharge Instructions (Signed)
[facility]      [facility] °[phone] ° °OPEN ABDOMINAL SURGERY: POST OP INSTRUCTIONS ° °Always review your discharge instruction sheet given to you by the facility where your surgery was performed. ° °IF YOU HAVE DISABILITY OR FAMILY LEAVE FORMS, YOU MUST BRING THEM TO THE OFFICE FOR PROCESSING.  PLEASE DO NOT GIVE THEM TO YOUR DOCTOR. ° °A prescription for pain medication may be given to you upon discharge.  Take your pain medication as prescribed, if needed.  If narcotic pain medicine is not needed, then you may take acetaminophen (Tylenol) or ibuprofen (Advil) as needed. °Take your usually prescribed medications unless otherwise directed. °If you need a refill on your pain medication, please contact your pharmacy. They will contact our office to request authorization.  Prescriptions will not be filled after 5pm or on week-ends. °You should follow a light diet the first few days after arrival home, such as soup and crackers, pudding, etc.unless your doctor has advised otherwise. A high-fiber, low fat diet can be resumed as tolerated.   Be sure to include lots of fluids daily. Most patients will experience some swelling and bruising on the chest and neck area.  Ice packs will help.  Swelling and bruising can take several days to resolve °Most patients will experience some swelling and bruising in the area of the incision. Ice pack will help. Swelling and bruising can take several days to resolve..  °It is common to experience some constipation if taking pain medication after surgery.  Increasing fluid intake and taking a stool softener will usually help or prevent this problem from occurring.  A mild laxative (Milk of Magnesia or Miralax) should be taken according to package directions if there are no bowel movements after 48 hours. ° You may have steri-strips (small skin tapes) in place directly over the incision.  These strips should be left on the skin for 7-10 days.  If your surgeon used skin  glue on the incision, you may shower in 24 hours.  The glue will flake off over the next 2-3 weeks.  Any sutures or staples will be removed at the office during your follow-up visit. You may find that a light gauze bandage over your incision may keep your staples from being rubbed or pulled. You may shower and replace the bandage daily. °ACTIVITIES:  You may resume regular (light) daily activities beginning the next day--such as daily self-care, walking, climbing stairs--gradually increasing activities as tolerated.  You may have sexual intercourse when it is comfortable.  Refrain from any heavy lifting or straining until approved by your doctor. °You may drive when you no longer are taking prescription pain medication, you can comfortably wear a seatbelt, and you can safely maneuver your car and apply brakes ° °You should see your doctor in the office for a follow-up appointment approximately two weeks after your surgery.  Make sure that you call for this appointment within a day or two after you arrive home to insure a convenient appointment time. ° °WHEN TO CALL YOUR DOCTOR: °Fever over 101.0 °Inability to urinate °Nausea and/or vomiting °Extreme swelling or bruising °Continued bleeding from incision. °Increased pain, redness, or drainage from the incision. °Difficulty swallowing or breathing °Muscle cramping or spasms. °Numbness or tingling in hands or feet or around lips. ° °The clinic staff is available to answer your questions during regular business hours.  Please don’t hesitate to call and ask to speak to one of the nurses if you have concerns. ° °For   further questions, please visit [email or web address]  °

## 2022-11-28 DIAGNOSIS — D649 Anemia, unspecified: Secondary | ICD-10-CM | POA: Diagnosis not present

## 2022-11-28 DIAGNOSIS — C189 Malignant neoplasm of colon, unspecified: Secondary | ICD-10-CM | POA: Diagnosis not present

## 2022-11-28 DIAGNOSIS — R21 Rash and other nonspecific skin eruption: Secondary | ICD-10-CM | POA: Diagnosis not present

## 2022-11-28 DIAGNOSIS — K567 Ileus, unspecified: Secondary | ICD-10-CM | POA: Diagnosis not present

## 2022-11-28 LAB — CBC WITH DIFFERENTIAL/PLATELET
Abs Immature Granulocytes: 0.07 10*3/uL (ref 0.00–0.07)
Basophils Absolute: 0 10*3/uL (ref 0.0–0.1)
Basophils Relative: 0 %
Eosinophils Absolute: 1.2 10*3/uL — ABNORMAL HIGH (ref 0.0–0.5)
Eosinophils Relative: 10 %
HCT: 28.3 % — ABNORMAL LOW (ref 36.0–46.0)
Hemoglobin: 8.2 g/dL — ABNORMAL LOW (ref 12.0–15.0)
Immature Granulocytes: 1 %
Lymphocytes Relative: 11 %
Lymphs Abs: 1.3 10*3/uL (ref 0.7–4.0)
MCH: 21.6 pg — ABNORMAL LOW (ref 26.0–34.0)
MCHC: 29 g/dL — ABNORMAL LOW (ref 30.0–36.0)
MCV: 74.5 fL — ABNORMAL LOW (ref 80.0–100.0)
Monocytes Absolute: 0.8 10*3/uL (ref 0.1–1.0)
Monocytes Relative: 7 %
Neutro Abs: 8.4 10*3/uL — ABNORMAL HIGH (ref 1.7–7.7)
Neutrophils Relative %: 71 %
Platelets: 278 10*3/uL (ref 150–400)
RBC: 3.8 MIL/uL — ABNORMAL LOW (ref 3.87–5.11)
WBC: 11.8 10*3/uL — ABNORMAL HIGH (ref 4.0–10.5)
nRBC: 0 % (ref 0.0–0.2)

## 2022-11-28 LAB — BASIC METABOLIC PANEL
Anion gap: 7 (ref 5–15)
BUN: 8 mg/dL (ref 6–20)
CO2: 22 mmol/L (ref 22–32)
Calcium: 7.3 mg/dL — ABNORMAL LOW (ref 8.9–10.3)
Chloride: 109 mmol/L (ref 98–111)
Creatinine, Ser: 0.45 mg/dL (ref 0.44–1.00)
GFR, Estimated: >60 mL/min (ref >60)
Glucose, Bld: 104 mg/dL — ABNORMAL HIGH (ref 70–99)
Potassium: 3.3 mmol/L — ABNORMAL LOW (ref 3.5–5.1)
Sodium: 138 mmol/L (ref 135–145)

## 2022-11-28 LAB — MAGNESIUM: Magnesium: 2.2 mg/dL (ref 1.7–2.4)

## 2022-11-28 LAB — SURGICAL PATHOLOGY

## 2022-11-28 MED ORDER — FOLIC ACID 1 MG PO TABS
1.0000 mg | ORAL_TABLET | Freq: Every day | ORAL | Status: DC
  Administered 2022-11-29 – 2022-12-06 (×8): 1 mg via ORAL
  Filled 2022-11-28 (×8): qty 1

## 2022-11-28 MED ORDER — ENOXAPARIN SODIUM 80 MG/0.8ML IJ SOSY: 80.0000 mg | PREFILLED_SYRINGE | INTRAMUSCULAR | Status: DC

## 2022-11-28 MED ORDER — POTASSIUM CHLORIDE CRYS ER 20 MEQ PO TBCR
40.0000 meq | EXTENDED_RELEASE_TABLET | Freq: Once | ORAL | Status: AC
  Administered 2022-11-28: 40 meq via ORAL
  Filled 2022-11-28: qty 2

## 2022-11-28 MED ORDER — METHYLPREDNISOLONE SODIUM SUCC 40 MG IJ SOLR
40.0000 mg | Freq: Every day | INTRAMUSCULAR | Status: DC
  Administered 2022-11-28 – 2022-12-05 (×8): 40 mg via INTRAVENOUS
  Filled 2022-11-28 (×8): qty 1

## 2022-11-28 MED ORDER — POTASSIUM CHLORIDE 20 MEQ PO PACK
40.0000 meq | PACK | Freq: Once | ORAL | Status: DC
  Filled 2022-11-28: qty 2

## 2022-11-28 MED ORDER — ENOXAPARIN SODIUM 40 MG/0.4ML IJ SOSY
40.0000 mg | PREFILLED_SYRINGE | INTRAMUSCULAR | Status: DC
  Administered 2022-11-28 – 2022-12-04 (×7): 40 mg via SUBCUTANEOUS
  Filled 2022-11-28 (×7): qty 0.4

## 2022-11-28 NOTE — Progress Notes (Signed)
Roni Friberg [place]   DOB:[birth year]   GG#:[id number]   LYY#:[id number]  Oncology follow up   Subjective: Patient is recovering well from surgery, she is tolerating oral intake well, has good bowel movement.  Pain is controlled.  I discussed her surgical pathology findings with her today.   Objective:  Vitals:   11/28/22 0318 11/28/22 1301  BP: 127/74 131/68  Pulse: (!) 105 (!) 101  Resp: 19 18  Temp: 98.8 F (37.1 C) 98.3 F (36.8 C)  SpO2: 100% 100%    Body mass index is 65.33 kg/m.  Intake/Output Summary (Last 24 hours) at 11/28/2022 1710 Last data filed at 11/28/2022 0319 Gross per 24 hour  Intake --  Output 905 ml  Net -905 ml     Sclerae unicteric  MSK no focal spinal tenderness, no peripheral edema  Neuro nonfocal    CBG (last 3)  No results for input(s): "GLUCAP" in the last 72 hours.   Labs:  Urine Studies No results for input(s): "UHGB", "CRYS" in the last 72 hours.  Invalid input(s): "UACOL", "UAPR", "USPG", "UPH", "UTP", "UGL", "UKET", "UBIL", "UNIT", "UROB", "ULEU", "UEPI", "UWBC", "URBC", "UBAC", "CAST", "UCOM", "BILUA"  Basic Metabolic Panel: Recent Labs  Lab 11/22/22 0455 11/22/22 1041 11/24/22 0036 11/25/22 0210 11/26/22 0856 11/27/22 0401 11/28/22 0401  NA 141   < > 145 141 139 138 138  K 3.7   < > 3.5 3.8 3.6 3.4* 3.3*  CL 112*   < > 113* 113* 109 108 109  CO2 24   < > '25 24 24 22 22  '$ GLUCOSE 143*   < > 123* 92 90 90 104*  BUN 12   < > '13 11 11 10 8  '$ CREATININE 0.52   < > 0.62 0.57 0.49 0.51 0.45  CALCIUM 8.1*   < > 7.8* 7.9* 7.7* 7.4* 7.3*  MG 2.5*  --  2.2 2.2 2.3 2.1 2.2  PHOS 2.1*  --  2.7 2.4* 3.3 2.8  --    < > = values in this interval not displayed.   GFR Estimated Creatinine Clearance: 146.4 mL/min (by C-G formula based on SCr of 0.45 mg/dL). Liver Function Tests: Recent Labs  Lab 11/22/22 0455 11/24/22 0036 11/25/22 0210 11/26/22 0856 11/27/22 0401  AST 14* 19 13* 13* 15  ALT '12 19 14 12 14  '$ ALKPHOS 46 44 38 44 39   BILITOT 0.4 0.6 0.7 0.7 0.8  PROT 6.2* 5.8* 5.3* 5.6* 4.8*  ALBUMIN 3.0* 2.6* 2.3* 2.4* 1.9*   No results for input(s): "LIPASE", "AMYLASE" in the last 168 hours. No results for input(s): "AMMONIA" in the last 168 hours. Coagulation profile No results for input(s): "INR", "PROTIME" in the last 168 hours.  CBC: Recent Labs  Lab 11/25/22 0210 11/26/22 0856 11/26/22 2137 11/27/22 0401 11/28/22 0401  WBC 18.9* 16.3* 15.0* 11.4* 11.8*  NEUTROABS 14.4* 11.6* 9.6* 6.9 8.4*  HGB 7.7* 8.3* 8.5* 7.8* 8.2*  HCT 27.3* 28.7* 29.2* 27.3* 28.3*  MCV 75.4* 74.7* 74.7* 75.2* 74.5*  PLT 198 268 299 254 278   Cardiac Enzymes: No results for input(s): "CKTOTAL", "CKMB", "CKMBINDEX", "TROPONINI" in the last 168 hours. BNP: Invalid input(s): "POCBNP" CBG: No results for input(s): "GLUCAP" in the last 168 hours. D-Dimer No results for input(s): "DDIMER" in the last 72 hours. Hgb A1c No results for input(s): "HGBA1C" in the last 72 hours. Lipid Profile No results for input(s): "CHOL", "HDL", "LDLCALC", "TRIG", "CHOLHDL", "LDLDIRECT" in the last 72 hours. Thyroid function studies No  results for input(s): "TSH", "T4TOTAL", "T3FREE", "THYROIDAB" in the last 72 hours.  Invalid input(s): "FREET3" Anemia work up No results for input(s): "VITAMINB12", "FOLATE", "FERRITIN", "TIBC", "IRON", "RETICCTPCT" in the last 72 hours. Microbiology No results found for this or any previous visit (from the past 240 hour(s)).    Studies:  No results found.  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  She had synchronized  stage II colon cancer, well to moderately differentiated, no other high risk features.  I do not recommend adjuvant chemotherapy. -I discussed her risk of recurrence is about 20 to 25% in the next 5 years, especially in the first 3 years.  Due to her comorbidities, she is not able to follow-up with [place] routinely in the office.  I discussed symptoms to watch at home, and call us if needed in the future.  She voiced good understanding, and agrees with the plan.   Truitt Merle, MD 11/28/2022  5:10 PM

## 2022-11-28 NOTE — Progress Notes (Signed)
Progress Note  6 Days Post-Op  Subjective: Rough night, feels tight and like she had a BM per rectum  Objective: Vital signs in last 24 hours: Temp:  [98.8 F (37.1 C)-99.8 F (37.7 C)] 98.8 F (37.1 C) (12/07 0318) Pulse Rate:  [105-106] 105 (12/07 0318) Resp:  [18-19] 19 (12/07 0318) BP: (127-138)/(70-74) 127/74 (12/07 0318) SpO2:  [100 %] 100 % (12/07 0318) Weight:  [183.6 kg] 183.6 kg (12/07 0500) Last BM Date : 11/27/22  Intake/Output from previous day: 12/06 0701 - 12/07 0700 In: 150 [IV Piggyback:150] Out: 1180 [Urine:630; Stool:550] Intake/Output this shift: No intake/output data recorded.  PE: General: pleasant, WD, obese female, NAD Lungs: Respiratory effort nonlabored Abd: soft, NT, ND, incisions C/D/I, ostomy viable with liquid stool in pouch Psych: A&Ox3 with an appropriate affect.    Lab Results:  Recent Labs    11/27/22 0401 11/28/22 0401  WBC 11.4* 11.8*  HGB 7.8* 8.2*  HCT 27.3* 28.3*  PLT 254 278    BMET Recent Labs    11/27/22 0401 11/28/22 0401  NA 138 138  K 3.4* 3.3*  CL 108 109  CO2 22 22  GLUCOSE 90 104*  BUN 10 8  CREATININE 0.51 0.45  CALCIUM 7.4* 7.3*    PT/INR No results for input(s): "LABPROT", "INR" in the last 72 hours. CMP     Component Value Date/Time   NA 138 11/28/2022 0401   K 3.3 (L) 11/28/2022 0401   CL 109 11/28/2022 0401   CO2 22 11/28/2022 0401   GLUCOSE 104 (H) 11/28/2022 0401   BUN 8 11/28/2022 0401   CREATININE 0.45 11/28/2022 0401   CALCIUM 7.3 (L) 11/28/2022 0401   PROT 4.8 (L) 11/27/2022 0401   ALBUMIN 1.9 (L) 11/27/2022 0401   AST 15 11/27/2022 0401   ALT 14 11/27/2022 0401   ALKPHOS 39 11/27/2022 0401   BILITOT 0.8 11/27/2022 0401   GFRNONAA >60 11/28/2022 0401   GFRAA >60 03/01/2019 0612   Lipase     Component Value Date/Time   LIPASE 27 10/02/2022 2133       Studies/Results: No results found.  Anti-infectives: Anti-infectives (From admission, onward)    Start      Dose/Rate Route Frequency Ordered Stop   11/27/22 1730  fluconazole (DIFLUCAN) tablet 200 mg        200 mg Oral Daily 11/27/22 1639     11/22/22 0600  cefoTEtan (CEFOTAN) 2 g in sodium chloride 0.9 % 100 mL IVPB        2 g 200 mL/hr over 30 Minutes Intravenous On call to O.R. 11/21/22 1427 11/22/22 1014   11/21/22 2200  neomycin (MYCIFRADIN) tablet 1,000 mg  Status:  Discontinued       See Hyperspace for full Linked Orders Report.   1,000 mg Oral 3 times per day 11/21/22 1427 11/21/22 1733   11/21/22 2200  metroNIDAZOLE (FLAGYL) tablet 1,000 mg  Status:  Discontinued       See Hyperspace for full Linked Orders Report.   1,000 mg Oral 3 times per day 11/21/22 1427 11/21/22 1733   11/21/22 2200  metroNIDAZOLE (FLAGYL) tablet 1,000 mg       See Hyperspace for full Linked Orders Report.   1,000 mg Oral  Once 11/21/22 1731 11/21/22 2120   11/21/22 2200  neomycin (MYCIFRADIN) tablet 1,000 mg       See Hyperspace for full Linked Orders Report.   1,000 mg Oral  Once 11/21/22 1731 11/21/22 2231  11/21/22 1900  metroNIDAZOLE (FLAGYL) tablet 1,000 mg       See Hyperspace for full Linked Orders Report.   1,000 mg Oral  Once 11/21/22 1731 11/21/22 1918   11/21/22 1900  neomycin (MYCIFRADIN) tablet 1,000 mg       See Hyperspace for full Linked Orders Report.   1,000 mg Oral  Once 11/21/22 1731 11/21/22 1918   11/21/22 1800  metroNIDAZOLE (FLAGYL) tablet 1,000 mg       See Hyperspace for full Linked Orders Report.   1,000 mg Oral  Once 11/21/22 1731 11/21/22 1753   11/21/22 1800  neomycin (MYCIFRADIN) tablet 1,000 mg       See Hyperspace for full Linked Orders Report.   1,000 mg Oral  Once 11/21/22 1731 11/21/22 1753   11/14/22 1545  cefTRIAXone (ROCEPHIN) 1 g in sodium chloride 0.9 % 100 mL IVPB        1 g 200 mL/hr over 30 Minutes Intravenous  Once 11/14/22 1539 11/15/22 0700        Assessment/Plan  POD5 s/p  laparoscopic right colectomy with ileo-colic anastomosis, laparoscopic sigmoid  resection with end colostomy, skin biopsy  - c scope bx with adenocarcinoma. Surgical with synchronous T3N0 Tumors - discussed with patient.  Punch biopsy dermatitis. - WBC 11  - CEA 51.3, CT CH without nodules, enlarged b/l axillary lymph nodes. Onc has been consulted - Hgb down to 7.8 this AM, stable - Regular diet - stable for discharge from a surgical perspective when tolerating diet and comfortable with ostomy care    FEN: FLD and ADAT to soft ID: cefotetan periop VTE: lovenox   - Per primary attending -  Rash s/p punch bx - path pending  HTN Paraplegia Morbid Obesity Thrombocytopenia, resolved   LOS: 14 days     Felicie Morn, MD [facility] 11/28/2022, 8:21 AM Please see Amion for pager number during day hours 7:00am-4:30pm

## 2022-11-28 NOTE — Consult Note (Addendum)
[facility] Nurse ostomy follow up Stoma type/location: LUQ colostomy Stomal assessment/size: 1 and 5/8 inches slightly oval stoma, red, budded, moist Peristomal assessment: intact Treatment options for stomal/peristomal skin: skin barrier ring Output: brown liquid stool Ostomy pouching: 2pc. 2 and 3/4 inch inch pouching system with skin barrier ring. Education provided: Patient is able to follow along with pouch change procedure as I change pouch. She can perform lock and roll closure independently and understands the emptying process and frequency. She will need continued practice to change her pouch. Recommend HHRN post discharge for reinforcement of education and support as patient adjusts pattern with ostomy edema resolution and practices changing until independent. If you agree, please order/arrange. Enrolled patient in [place] Start Discharge program: Yes  Supplies in room: 4 pouches, 4 skin barriers, 4 rings.  [facility] nursing team will follow, and will remain available to this patient, the nursing and medical teams.  Thank you for inviting [place] to participate in this patient's Plan of Care.  Maudie Flakes, MSN, RN, CNS, [place], Serita Grammes, [employer], [employer] phone:  [phone]

## 2022-11-28 NOTE — Progress Notes (Signed)
Progress Note    [facility]   LDJ:[id number]  DOB: [birth year]  DOA: 11/14/2022     14 PCP: [facility]  Initial CC: rash  Hospital Course: Ms. Sylvan is a 51 yo female with PMH paraplegia 2/2 laminectomy, anemia, morbid obesity, HTN, constipation.  She originally presented to the hospital due to an ongoing and worsening rash that started at home approximately 1 month prior to admission.  The only associated new medicine was having started dicyclomine.  After approximately 3 days of the medication, she stopped it however states that the rash has continued to worsen and progress. She also endorsed trial of steroids with no improvement.  The rash is pruritic in nature.  On workup, she was also found to be anemic, hemoglobin 6.8 g/dL.  CT abdomen/pelvis was concerning for irregular thickening along the ascending colon and also a second site of thickening in the sigmoid colon. She underwent evaluation by GI and ultimately underwent colonoscopy on 11/19/2022.  Biopsies of ascending colon and sigmoid colon from colonoscopy returned positive with moderately differentiated adenocarcinoma and fragments of adenoma with high-grade dysplasia focally approaching intramucosal adenocarcinoma for the sigmoid colon biopsy. General surgery was consulted and she underwent laparoscopic right colectomy with ileocolic anastomosis and laparoscopic sigmoid resection with end colostomy.  Also underwent skin biopsy.  Interval History:  No events overnight.    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[place]) - CT concerning for colon cancer on admission - Colonoscopy 11/28 followed by surgery on 12/1 -Pathology from surgery specimens has returned with moderately differentiated adenocarcinoma involving right colon/terminal ileum, sigmoid colon, colon/rectum -Pericolic adipose tissue also invaded per pathology results; will discuss with oncology -Ostomy bag starting to function - General  surgery following - Now on regular diet  Ileus ([place]) - Ostomy now showing function - Appreciate surgery assistance - Continue diet as per surgery  Bullous rash - Patient presented with approximately 1 month history of rash prior to hospitalization with associated pruritus and weeping blisters.  No lesions involving mouth -Skin biopsy obtained on 11/22/2022 showing mild superficial perivascular dermatitis with eosinophils - initial precipitating factor thought to be dicyclomine at onset but has seemed to progress despite stopping med after ~3 days of use - s/p intermittent use of hydrocortisone cream (not routine enough), claritin, nystatin powder, pepcid, atarax and she still hasn't shown much response - biopsy 12/1 noted with: mild superficial perivascular dermatitis with eosinophils - given appearance and timeline, still higher suspicion of drug reaction (esp given the bullae formation). She may have delayed response to treatment due to underlying malignancy and immunocompromised state - has had enough of above treatment thus far; transition to trial of triamcinolone (needs to be placed BID, not intermittently), start back solumedrol (she associates prednisone with more pruritus), and will try fluconazole in case of component of some underlying tinea  Symptomatic anemia - Initially presented with hemoglobin 6.8 g/dL on 11/14/2022 - She has received 3 units PRBC thus far during hospitalization - Etiology suspected due to underlying slow blood loss from adenocarcinoma - Continue trending CBC  Paraplegia ([place]) - Due to history of laminectomy - Bedbound - Home health aide for care at home  Thrombocytopenia [facility]) - Known history dating back to at least 2020 -Platelets have been mostly normal during hospitalization - Continue trending CBC  Hypophosphatemia - Replete as needed  Constipation - Now has end ostomy in place  Hypokalemia - Replete as needed  Essential hypertension -  Currently controlled without medications - Continue  monitoring  Leukocytosis - multifactorial likely from reactive nature and/or some demargination from steroid use; no overt signs of infection - continue trending CBC  Morbid obesity with body mass index (BMI) of 60.0 to 69.9 in adult [facility]) - Complicates overall prognosis and care - Body mass index is 65.58 kg/m.   Old records reviewed in assessment of this patient  Antimicrobials:   DVT prophylaxis:  enoxaparin (LOVENOX) injection 40 mg Start: 11/28/22 0800 SCD's Start: 11/22/22 1741 Place TED hose Start: 11/22/22 1741 SCDs Start: 11/14/22 2005   Code Status:   Code Status: Full Code  Mobility Assessment (last 72 hours)     Mobility Assessment     Row Name 11/28/22 0930 11/27/22 1550 11/27/22 1452 11/27/22 1000 11/26/22 2100   Does patient have an order for bedrest or is patient medically unstable No - Continue assessment -- -- No - Continue assessment No - Continue assessment   What is the highest level of mobility based on the progressive mobility assessment? Level 1 (Bedfast) - Unable to balance while sitting on edge of bed Level 1 (Bedfast) - Unable to balance while sitting on edge of bed Level 1 (Bedfast) - Unable to balance while sitting on edge of bed Level 1 (Bedfast) - Unable to balance while sitting on edge of bed Level 1 (Bedfast) - Unable to balance while sitting on edge of bed   Is the above level different from baseline mobility prior to current illness? Yes - Recommend PT order -- -- Yes - Recommend PT order Yes - Recommend PT order    [place] Name 11/26/22 0800 11/25/22 2200         Does patient have an order for bedrest or is patient medically unstable No - Continue assessment No - Continue assessment      What is the highest level of mobility based on the progressive mobility assessment? Level 2 ([place]) - Balance while sitting on edge of bed and cannot stand Level 2 (Chairfast) - Balance while sitting on  edge of bed and cannot stand      Is the above level different from baseline mobility prior to current illness? Yes - Recommend PT order Yes - Recommend PT order               Barriers to discharge:  Disposition Plan:  Home Status is: Inpt  Objective: Blood pressure 131/68, pulse (!) 101, temperature 98.3 F (36.8 C), temperature source Oral, resp. rate 18, height '5\' 6"'$  (1.676 m), weight (!) 183.6 kg, SpO2 100 %.  Examination:  Physical Exam Constitutional:      Appearance: Normal appearance. She is obese.  HENT:     Head: Normocephalic and atraumatic.     Mouth/Throat:     Mouth: Mucous membranes are moist.     Comments: No lesions appreciated Eyes:     Extraocular Movements: Extraocular movements intact.  Neck:     Comments: Small bulla noted on neck with weeping Cardiovascular:     Rate and Rhythm: Normal rate and regular rhythm.  Pulmonary:     Effort: Pulmonary effort is normal. No respiratory distress.     Breath sounds: Normal breath sounds.  Abdominal:     Palpations: Abdomen is soft.     Comments: Ostomy bag in place with stool noted  Musculoskeletal:     Comments: Chronic edema notably in lower extremities  Skin:    Findings: Rash present.     Comments: Scattered areas with hyperpigmented skin with desiccated and  peeling skin superficially; no erythema. There are scattered bullae throughout skin too with serous weeping  Neurological:     Mental Status: She is alert.     Comments: Chronic paraplegia at baseline  Psychiatric:        Mood and Affect: Mood normal.      Consultants:  General surgery GI  Procedures:  11/19/22: Colonoscopy 12/1:Laparoscopic right colectomy with ileo-colic anastomosis, laparoscopic sigmoid resection with end colostomy, skin biopsy   Data Reviewed: Results for orders placed or performed during the hospital encounter of 11/14/22 (from the past 24 hour(s))  Basic metabolic panel     Status: Abnormal   Collection Time:  11/28/22  4:01 AM  Result Value Ref Range   Sodium 138 135 - 145 mmol/L   Potassium 3.3 (L) 3.5 - 5.1 mmol/L   Chloride 109 98 - 111 mmol/L   CO2 22 22 - 32 mmol/L   Glucose, Bld 104 (H) 70 - 99 mg/dL   BUN 8 6 - 20 mg/dL   Creatinine, Ser 0.45 0.44 - 1.00 mg/dL   Calcium 7.3 (L) 8.9 - 10.3 mg/dL   GFR, Estimated >60 >60 mL/min   Anion gap 7 5 - 15  CBC with Differential/Platelet     Status: Abnormal   Collection Time: 11/28/22  4:01 AM  Result Value Ref Range   WBC 11.8 (H) 4.0 - 10.5 K/uL   RBC 3.80 (L) 3.87 - 5.11 MIL/uL   Hemoglobin 8.2 (L) 12.0 - 15.0 g/dL   HCT 28.3 (L) 36.0 - 46.0 %   MCV 74.5 (L) 80.0 - 100.0 fL   MCH 21.6 (L) 26.0 - 34.0 pg   MCHC 29.0 (L) 30.0 - 36.0 g/dL   RDW Not Measured 11.5 - 15.5 %   Platelets 278 150 - 400 K/uL   nRBC 0.0 0.0 - 0.2 %   Neutrophils Relative % 71 %   Neutro Abs 8.4 (H) 1.7 - 7.7 K/uL   Lymphocytes Relative 11 %   Lymphs Abs 1.3 0.7 - 4.0 K/uL   Monocytes Relative 7 %   Monocytes Absolute 0.8 0.1 - 1.0 K/uL   Eosinophils Relative 10 %   Eosinophils Absolute 1.2 (H) 0.0 - 0.5 K/uL   Basophils Relative 0 %   Basophils Absolute 0.0 0.0 - 0.1 K/uL   Immature Granulocytes 1 %   Abs Immature Granulocytes 0.07 0.00 - 0.07 K/uL   Polychromasia PRESENT    Target Cells PRESENT    Ovalocytes PRESENT   Magnesium     Status: None   Collection Time: 11/28/22  4:01 AM  Result Value Ref Range   Magnesium 2.2 1.7 - 2.4 mg/dL    I have Reviewed nursing notes, Vitals, and Lab results since pt's last encounter. Pertinent lab results : see above I have ordered test including BMP, CBC, Mg I have reviewed the last note from staff over past 24 hours I have discussed pt's care plan and test results with nursing staff, case manager  Time spent: Greater than 50% of the 55 minute visit was spent in counseling/coordination of care for the patient as laid out in the A&P.    LOS: 14 days   Dwyane Dee, MD [facility] 11/28/2022, 4:34  PM

## 2022-11-29 DIAGNOSIS — D649 Anemia, unspecified: Secondary | ICD-10-CM | POA: Diagnosis not present

## 2022-11-29 DIAGNOSIS — K567 Ileus, unspecified: Secondary | ICD-10-CM | POA: Diagnosis not present

## 2022-11-29 DIAGNOSIS — C189 Malignant neoplasm of colon, unspecified: Secondary | ICD-10-CM | POA: Diagnosis not present

## 2022-11-29 DIAGNOSIS — R21 Rash and other nonspecific skin eruption: Secondary | ICD-10-CM | POA: Diagnosis not present

## 2022-11-29 LAB — CBC WITH DIFFERENTIAL/PLATELET
Abs Immature Granulocytes: 0.06 10*3/uL (ref 0.00–0.07)
Basophils Absolute: 0 10*3/uL (ref 0.0–0.1)
Basophils Relative: 0 %
Eosinophils Absolute: 0 10*3/uL (ref 0.0–0.5)
Eosinophils Relative: 0 %
HCT: 26.8 % — ABNORMAL LOW (ref 36.0–46.0)
Hemoglobin: 7.6 g/dL — ABNORMAL LOW (ref 12.0–15.0)
Immature Granulocytes: 1 %
Lymphocytes Relative: 10 %
Lymphs Abs: 1 10*3/uL (ref 0.7–4.0)
MCH: 21.3 pg — ABNORMAL LOW (ref 26.0–34.0)
MCHC: 28.4 g/dL — ABNORMAL LOW (ref 30.0–36.0)
MCV: 75.3 fL — ABNORMAL LOW (ref 80.0–100.0)
Monocytes Absolute: 0.2 10*3/uL (ref 0.1–1.0)
Monocytes Relative: 2 %
Neutro Abs: 8.1 10*3/uL — ABNORMAL HIGH (ref 1.7–7.7)
Neutrophils Relative %: 87 %
Platelets: 281 10*3/uL (ref 150–400)
RBC: 3.56 MIL/uL — ABNORMAL LOW (ref 3.87–5.11)
WBC: 9.3 10*3/uL (ref 4.0–10.5)
nRBC: 0 % (ref 0.0–0.2)

## 2022-11-29 LAB — BASIC METABOLIC PANEL
Anion gap: 8 (ref 5–15)
BUN: 7 mg/dL (ref 6–20)
CO2: 22 mmol/L (ref 22–32)
Calcium: 7.6 mg/dL — ABNORMAL LOW (ref 8.9–10.3)
Chloride: 107 mmol/L (ref 98–111)
Creatinine, Ser: 0.53 mg/dL (ref 0.44–1.00)
GFR, Estimated: >60 mL/min (ref >60)
Glucose, Bld: 110 mg/dL — ABNORMAL HIGH (ref 70–99)
Potassium: 4 mmol/L (ref 3.5–5.1)
Sodium: 137 mmol/L (ref 135–145)

## 2022-11-29 LAB — MAGNESIUM: Magnesium: 2.2 mg/dL (ref 1.7–2.4)

## 2022-11-29 MED ORDER — METHOCARBAMOL 500 MG PO TABS: 750.0000 mg | ORAL_TABLET | Freq: Four times a day (QID) | ORAL | Status: DC | PRN

## 2022-11-29 MED ORDER — ACETAMINOPHEN 500 MG PO TABS: 1000.0000 mg | ORAL_TABLET | Freq: Four times a day (QID) | ORAL | 0 refills | Status: AC

## 2022-11-29 MED ORDER — BACLOFEN 20 MG PO TABS
20.0000 mg | ORAL_TABLET | Freq: Every day | ORAL | Status: DC
  Administered 2022-11-29 – 2022-12-05 (×7): 20 mg via ORAL
  Filled 2022-11-29 (×7): qty 1

## 2022-11-29 MED ORDER — BACLOFEN 10 MG PO TABS
5.0000 mg | ORAL_TABLET | Freq: Two times a day (BID) | ORAL | Status: DC
  Administered 2022-11-29 – 2022-12-06 (×14): 5 mg via ORAL
  Filled 2022-11-29 (×14): qty 1

## 2022-11-29 MED ORDER — ALUM & MAG HYDROXIDE-SIMETH 200-200-20 MG/5ML PO SUSP
30.0000 mL | ORAL | Status: DC | PRN
  Administered 2022-11-30 (×4): 30 mL via ORAL
  Filled 2022-11-29 (×5): qty 30

## 2022-11-29 NOTE — Care Management Important Message (Signed)
Important Message  Patient Details IM Letter given. Name: Maesyn Frisinger MRN: [id number] Date of Birth: Jan 12, 1971   Medicare Important Message Given:  Yes     Kerin Salen 11/29/2022, 1:38 PM

## 2022-11-29 NOTE — Progress Notes (Signed)
PT Cancellation Note  Patient Details Name: [facility] MRN: [id number] DOB: 03/18/1971   Cancelled Treatment:    Reason Eval/Treat Not Completed: Other (comment) (nauseas). Pt reports being nauseas, requesting ginger ale and saltines; provided requests and notified RN. Will check back as able.    Talbot Grumbling PT, DPT 11/29/22, 11:49 AM

## 2022-11-29 NOTE — Progress Notes (Addendum)
Progress Note  7 Days Post-Op  Subjective: Still having more pain at night. Having reflux and what feels like gas pain. Has not tried much soft food but no nausea or emesis. Has had stool output but none in bag overnight/this am.  Objective: Vital signs in last 24 hours: Temp:  [98 F (36.7 C)-98.4 F (36.9 C)] 98.4 F (36.9 C) (12/08 0435) Pulse Rate:  [89-101] 89 (12/08 0435) Resp:  [18-20] 20 (12/08 0435) BP: (131-138)/(68-80) 134/80 (12/08 0435) SpO2:  [99 %-100 %] 100 % (12/08 0435) Weight:  [182 kg] 182 kg (12/08 0430) Last BM Date : 11/27/22  Intake/Output from previous day: 12/07 0701 - 12/08 0700 In: 480 [P.O.:480] Out: 700 [Urine:700] Intake/Output this shift: No intake/output data recorded.  PE: General: pleasant, WD, obese female, NAD Lungs: Respiratory effort nonlabored Abd: soft, incisions C/D/I with surgical glue, ostomy viable gas in pouch. TTP over incisions Psych: A&Ox3 with an appropriate affect.    Lab Results:  Recent Labs    11/28/22 0401 11/29/22 0335  WBC 11.8* 9.3  HGB 8.2* 7.6*  HCT 28.3* 26.8*  PLT 278 281    BMET Recent Labs    11/28/22 0401 11/29/22 0335  NA 138 137  K 3.3* 4.0  CL 109 107  CO2 22 22  GLUCOSE 104* 110*  BUN 8 7  CREATININE 0.45 0.53  CALCIUM 7.3* 7.6*    PT/INR No results for input(s): "LABPROT", "INR" in the last 72 hours. CMP     Component Value Date/Time   NA 137 11/29/2022 0335   K 4.0 11/29/2022 0335   CL 107 11/29/2022 0335   CO2 22 11/29/2022 0335   GLUCOSE 110 (H) 11/29/2022 0335   BUN 7 11/29/2022 0335   CREATININE 0.53 11/29/2022 0335   CALCIUM 7.6 (L) 11/29/2022 0335   PROT 4.8 (L) 11/27/2022 0401   ALBUMIN 1.9 (L) 11/27/2022 0401   AST 15 11/27/2022 0401   ALT 14 11/27/2022 0401   ALKPHOS 39 11/27/2022 0401   BILITOT 0.8 11/27/2022 0401   GFRNONAA >60 11/29/2022 0335   GFRAA >60 03/01/2019 0612   Lipase     Component Value Date/Time   LIPASE 27 10/02/2022 2133        Studies/Results: No results found.  Anti-infectives: Anti-infectives (From admission, onward)    Start     Dose/Rate Route Frequency Ordered Stop   11/27/22 1730  fluconazole (DIFLUCAN) tablet 200 mg        200 mg Oral Daily 11/27/22 1639     11/22/22 0600  cefoTEtan (CEFOTAN) 2 g in sodium chloride 0.9 % 100 mL IVPB        2 g 200 mL/hr over 30 Minutes Intravenous On call to O.R. 11/21/22 1427 11/22/22 1014   11/21/22 2200  neomycin (MYCIFRADIN) tablet 1,000 mg  Status:  Discontinued       See Hyperspace for full Linked Orders Report.   1,000 mg Oral 3 times per day 11/21/22 1427 11/21/22 1733   11/21/22 2200  metroNIDAZOLE (FLAGYL) tablet 1,000 mg  Status:  Discontinued       See Hyperspace for full Linked Orders Report.   1,000 mg Oral 3 times per day 11/21/22 1427 11/21/22 1733   11/21/22 2200  metroNIDAZOLE (FLAGYL) tablet 1,000 mg       See Hyperspace for full Linked Orders Report.   1,000 mg Oral  Once 11/21/22 1731 11/21/22 2120   11/21/22 2200  neomycin (MYCIFRADIN) tablet 1,000 mg  See Hyperspace for full Linked Orders Report.   1,000 mg Oral  Once 11/21/22 1731 11/21/22 2231   11/21/22 1900  metroNIDAZOLE (FLAGYL) tablet 1,000 mg       See Hyperspace for full Linked Orders Report.   1,000 mg Oral  Once 11/21/22 1731 11/21/22 1918   11/21/22 1900  neomycin (MYCIFRADIN) tablet 1,000 mg       See Hyperspace for full Linked Orders Report.   1,000 mg Oral  Once 11/21/22 1731 11/21/22 1918   11/21/22 1800  metroNIDAZOLE (FLAGYL) tablet 1,000 mg       See Hyperspace for full Linked Orders Report.   1,000 mg Oral  Once 11/21/22 1731 11/21/22 1753   11/21/22 1800  neomycin (MYCIFRADIN) tablet 1,000 mg       See Hyperspace for full Linked Orders Report.   1,000 mg Oral  Once 11/21/22 1731 11/21/22 1753   11/14/22 1545  cefTRIAXone (ROCEPHIN) 1 g in sodium chloride 0.9 % 100 mL IVPB        1 g 200 mL/hr over 30 Minutes Intravenous  Once 11/14/22 1539 11/15/22  0700        Assessment/Plan Right colon mass, sigmoid mass  POD7 s/p laparoscopic right colectomy with ileo-colic anastomosis, laparoscopic sigmoid resection with end colostomy, skin biopsy 12/7 Dr. Kieth Brightly - c scope bx with adenocarcinoma. Surgical with synchronous T3N0 Tumors - discussed with patient.  Punch biopsy dermatitis. - CEA 51.3, CT CH without nodules, enlarged b/l axillary lymph nodes. Onc has been consulted and evaluated 12/7 - Hgb down but overall stable at 7.6 this am - Regular diet. Add mylanta for reflux. Home baclofen ordered - stable for discharge from a surgical perspective when tolerating diet and comfortable with ostomy care - states she feels she needs more ostomy teaching prior to dc. States her sister would be willing to learn ostomy care. Has HH already - will [facility] out to [facility] regard to [facility] related to ostomy   FEN: regular ID: cefotetan periop VTE: lovenox   - Per primary attending -  Rash s/p punch bx - dermatitis HTN Paraplegia Morbid Obesity Thrombocytopenia, resolved    LOS: 15 days     Winferd Humphrey, [facility] Surgery 11/29/2022, 8:37 AM Please see Amion for pager number during day hours 7:00am-4:30pm

## 2022-11-29 NOTE — Progress Notes (Signed)
Progress Note    Ellen Hill   OFB:[id number]  DOB: [birth year]  DOA: 11/14/2022     15 PCP: [facility]  Initial CC: rash  Hospital Course: Ellen Hill is a 51 yo female with PMH paraplegia 2/2 laminectomy, anemia, morbid obesity, HTN, constipation.  Ellen Hill originally presented to the hospital due to an ongoing and worsening rash that started at home approximately 1 month prior to admission.  The only associated new medicine was having started dicyclomine.  After approximately 3 days of the medication, Ellen Hill stopped it however states that the rash has continued to worsen and progress. Ellen Hill also endorsed trial of steroids with no improvement.  The rash is pruritic in nature.  On workup, Ellen Hill was also found to be anemic, hemoglobin 6.8 g/dL.  CT abdomen/pelvis was concerning for irregular thickening along the ascending colon and also a second site of thickening in the sigmoid colon. Ellen Hill underwent evaluation by GI and ultimately underwent colonoscopy on 11/19/2022.  Biopsies of ascending colon and sigmoid colon from colonoscopy returned positive with moderately differentiated adenocarcinoma and fragments of adenoma with high-grade dysplasia focally approaching intramucosal adenocarcinoma for the sigmoid colon biopsy. General surgery was consulted and Ellen Hill underwent laparoscopic right colectomy with ileocolic anastomosis and laparoscopic sigmoid resection with end colostomy.  Also underwent skin biopsy.  Interval History:  No events overnight.  Still having some itching but states has started to finally improve.  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  Ellen Hill will  follow-up as needed outpatient -Ostomy bag starting to function; needs ongoing education before able to discharge - General surgery following - Now on regular diet  Ileus (HCC)-resolved as of 11/29/2022 - Ostomy now showing function - Appreciate surgery assistance - Continue diet as per surgery  Bullous rash - Patient presented with approximately 1 month history of rash prior to hospitalization with associated pruritus and weeping blisters.  No lesions involving mouth -Skin biopsy obtained on 11/22/2022 showing mild superficial perivascular dermatitis with eosinophils - initial precipitating factor thought to be dicyclomine at onset but has seemed to progress despite stopping med after ~3 days of use - s/p intermittent use of hydrocortisone cream (not routine enough), claritin, nystatin powder, pepcid, atarax and Ellen Hill still hasn't shown much response - biopsy 12/1 noted with: mild superficial perivascular dermatitis with eosinophils - given appearance and timeline, still higher suspicion of drug reaction (esp given the bullae formation). Ellen Hill may have delayed response to treatment due to underlying malignancy and immunocompromised state - has had enough of above treatment thus far; transition to trial of triamcinolone (needs to be placed BID, not intermittently), start back solumedrol (Ellen Hill associates prednisone with more pruritus), and will try fluconazole in case of component of some underlying tinea  Symptomatic anemia - Initially presented with hemoglobin 6.8 g/dL on 11/14/2022 - Ellen Hill has received 3 units PRBC thus far during hospitalization - Etiology suspected due to underlying slow blood loss from adenocarcinoma - Continue trending CBC  Paraplegia (HCC) - Due to history of laminectomy - Bedbound - Home health aide for care at home  Thrombocytopenia [facility]) - Known history dating back to at least 2020 -Platelets have been mostly normal during hospitalization - Continue trending  CBC  Hypophosphatemia - Replete as needed  Constipation - Now  has end ostomy in place  Hypokalemia - Replete as needed  Essential hypertension - Currently controlled without medications - Continue monitoring  Leukocytosis - multifactorial likely from reactive nature and/or some demargination from steroid use; no overt signs of infection - continue trending CBC  Morbid obesity with body mass index (BMI) of 60.0 to 69.9 in adult [facility]) - Complicates overall prognosis and care - Body mass index is 65.58 kg/m.   Old records reviewed in assessment of this patient  Antimicrobials:   DVT prophylaxis:  enoxaparin (LOVENOX) injection 40 mg Start: 11/28/22 0800 SCD's Start: 11/22/22 1741 Place TED hose Start: 11/22/22 1741 SCDs Start: 11/14/22 2005   Code Status:   Code Status: Full Code  Mobility Assessment (last 72 hours)     Mobility Assessment     Row Name 11/29/22 0930 11/28/22 0930 11/27/22 1550 11/27/22 1452 11/27/22 1000   Does patient have an order for bedrest or is patient medically unstable No - Continue assessment No - Continue assessment -- -- No - Continue assessment   What is the highest level of mobility based on the progressive mobility assessment? Level 1 (Bedfast) - Unable to balance while sitting on edge of bed Level 1 (Bedfast) - Unable to balance while sitting on edge of bed Level 1 (Bedfast) - Unable to balance while sitting on edge of bed Level 1 (Bedfast) - Unable to balance while sitting on edge of bed Level 1 (Bedfast) - Unable to balance while sitting on edge of bed   Is the above level different from baseline mobility prior to current illness? Yes - Recommend PT order Yes - Recommend PT order -- -- Yes - Recommend PT order    [place] Name 11/26/22 2100           Does patient have an order for bedrest or is patient medically unstable No - Continue assessment       What is the highest level of mobility based on the progressive mobility assessment?  Level 1 (Bedfast) - Unable to balance while sitting on edge of bed       Is the above level different from baseline mobility prior to current illness? Yes - Recommend PT order                Barriers to discharge:  Disposition Plan:  Home Status is: Inpt  Objective: Blood pressure (!) 144/80, pulse 88, temperature 98.6 F (37 C), temperature source Oral, resp. rate 16, height '5\' 6"'$  (1.676 m), weight (!) 182 kg, SpO2 100 %.  Examination:  Physical Exam Constitutional:      Appearance: Normal appearance. Ellen Hill is obese.  HENT:     Head: Normocephalic and atraumatic.     Mouth/Throat:     Mouth: Mucous membranes are moist.     Comments: No lesions appreciated Eyes:     Extraocular Movements: Extraocular movements intact.  Neck:     Comments: Small bulla noted on neck with weeping Cardiovascular:     Rate and Rhythm: Normal rate and regular rhythm.  Pulmonary:     Effort: Pulmonary effort is normal. No respiratory distress.     Breath sounds: Normal breath sounds.  Abdominal:     Palpations: Abdomen is soft.     Comments: Ostomy bag in place with stool noted  Musculoskeletal:     Comments: Chronic edema notably in lower extremities  Skin:    Findings: Rash present.     Comments: Scattered areas with hyperpigmented skin with desiccated and  peeling skin superficially; no erythema. There are scattered bullae throughout skin too with serous weeping  Neurological:     Mental Status: Ellen Hill is alert.     Comments: Chronic paraplegia at baseline  Psychiatric:        Mood and Affect: Mood normal.      Consultants:  General surgery GI  Procedures:  11/19/22: Colonoscopy 12/1:Laparoscopic right colectomy with ileo-colic anastomosis, laparoscopic sigmoid resection with end colostomy, skin biopsy   Data Reviewed: Results for orders placed or performed during the hospital encounter of 11/14/22 (from the past 24 hour(s))  Basic metabolic panel     Status: Abnormal   Collection  Time: 11/29/22  3:35 AM  Result Value Ref Range   Sodium 137 135 - 145 mmol/L   Potassium 4.0 3.5 - 5.1 mmol/L   Chloride 107 98 - 111 mmol/L   CO2 22 22 - 32 mmol/L   Glucose, Bld 110 (H) 70 - 99 mg/dL   BUN 7 6 - 20 mg/dL   Creatinine, Ser 0.53 0.44 - 1.00 mg/dL   Calcium 7.6 (L) 8.9 - 10.3 mg/dL   GFR, Estimated >60 >60 mL/min   Anion gap 8 5 - 15  CBC with Differential/Platelet     Status: Abnormal   Collection Time: 11/29/22  3:35 AM  Result Value Ref Range   WBC 9.3 4.0 - 10.5 K/uL   RBC 3.56 (L) 3.87 - 5.11 MIL/uL   Hemoglobin 7.6 (L) 12.0 - 15.0 g/dL   HCT 26.8 (L) 36.0 - 46.0 %   MCV 75.3 (L) 80.0 - 100.0 fL   MCH 21.3 (L) 26.0 - 34.0 pg   MCHC 28.4 (L) 30.0 - 36.0 g/dL   RDW Not Measured 11.5 - 15.5 %   Platelets 281 150 - 400 K/uL   nRBC 0.0 0.0 - 0.2 %   Neutrophils Relative % 87 %   Neutro Abs 8.1 (H) 1.7 - 7.7 K/uL   Lymphocytes Relative 10 %   Lymphs Abs 1.0 0.7 - 4.0 K/uL   Monocytes Relative 2 %   Monocytes Absolute 0.2 0.1 - 1.0 K/uL   Eosinophils Relative 0 %   Eosinophils Absolute 0.0 0.0 - 0.5 K/uL   Basophils Relative 0 %   Basophils Absolute 0.0 0.0 - 0.1 K/uL   WBC Morphology VACUOLATED NEUTROPHILS    Immature Granulocytes 1 %   Abs Immature Granulocytes 0.06 0.00 - 0.07 K/uL   Polychromasia PRESENT    Target Cells PRESENT    Ovalocytes PRESENT   Magnesium     Status: None   Collection Time: 11/29/22  3:35 AM  Result Value Ref Range   Magnesium 2.2 1.7 - 2.4 mg/dL    I have Reviewed nursing notes, Vitals, and Lab results since pt's last encounter. Pertinent lab results : see above I have ordered test including BMP, CBC, Mg I have reviewed the last note from staff over past 24 hours I have discussed pt's care plan and test results with nursing staff, case manager  Time spent: Greater than 50% of the 55 minute visit was spent in counseling/coordination of care for the patient as laid out in the A&P.    LOS: 15 days   Dwyane Dee,  MD [facility] 11/29/2022, 3:32 PM

## 2022-11-30 DIAGNOSIS — C189 Malignant neoplasm of colon, unspecified: Secondary | ICD-10-CM | POA: Diagnosis not present

## 2022-11-30 DIAGNOSIS — D649 Anemia, unspecified: Secondary | ICD-10-CM | POA: Diagnosis not present

## 2022-11-30 DIAGNOSIS — R21 Rash and other nonspecific skin eruption: Secondary | ICD-10-CM | POA: Diagnosis not present

## 2022-11-30 DIAGNOSIS — K567 Ileus, unspecified: Secondary | ICD-10-CM | POA: Diagnosis not present

## 2022-11-30 LAB — CBC WITH DIFFERENTIAL/PLATELET
Abs Immature Granulocytes: 0.06 10*3/uL (ref 0.00–0.07)
Basophils Absolute: 0 10*3/uL (ref 0.0–0.1)
Basophils Relative: 0 %
Eosinophils Absolute: 0 10*3/uL (ref 0.0–0.5)
Eosinophils Relative: 0 %
HCT: 25.1 % — ABNORMAL LOW (ref 36.0–46.0)
Hemoglobin: 7.3 g/dL — ABNORMAL LOW (ref 12.0–15.0)
Immature Granulocytes: 1 %
Lymphocytes Relative: 16 %
Lymphs Abs: 1.4 10*3/uL (ref 0.7–4.0)
MCH: 21.6 pg — ABNORMAL LOW (ref 26.0–34.0)
MCHC: 29.1 g/dL — ABNORMAL LOW (ref 30.0–36.0)
MCV: 74.3 fL — ABNORMAL LOW (ref 80.0–100.0)
Monocytes Absolute: 0.7 10*3/uL (ref 0.1–1.0)
Monocytes Relative: 7 %
Neutro Abs: 6.9 10*3/uL (ref 1.7–7.7)
Neutrophils Relative %: 76 %
Platelets: 341 10*3/uL (ref 150–400)
RBC: 3.38 MIL/uL — ABNORMAL LOW (ref 3.87–5.11)
WBC: 9.1 10*3/uL (ref 4.0–10.5)
nRBC: 0 % (ref 0.0–0.2)

## 2022-11-30 LAB — BASIC METABOLIC PANEL
Anion gap: 7 (ref 5–15)
BUN: 8 mg/dL (ref 6–20)
CO2: 25 mmol/L (ref 22–32)
Calcium: 8.1 mg/dL — ABNORMAL LOW (ref 8.9–10.3)
Chloride: 108 mmol/L (ref 98–111)
Creatinine, Ser: 0.51 mg/dL (ref 0.44–1.00)
GFR, Estimated: >60 mL/min (ref >60)
Glucose, Bld: 90 mg/dL (ref 70–99)
Potassium: 3.7 mmol/L (ref 3.5–5.1)
Sodium: 140 mmol/L (ref 135–145)

## 2022-11-30 LAB — MAGNESIUM: Magnesium: 2.2 mg/dL (ref 1.7–2.4)

## 2022-11-30 NOTE — Progress Notes (Signed)
8 Days Post-Op   Subjective/Chief Complaint: Patient complaining of gas pain, bloating PRN Simethicone as written Having some ostomy output Not doing well with regular diet yet   Objective: Vital signs in last 24 hours: Temp:  [97.8 F (36.6 C)-98.9 F (37.2 C)] 97.8 F (36.6 C) (12/09 0500) Pulse Rate:  [85-88] 85 (12/09 0500) Resp:  [16-20] 18 (12/09 0500) BP: (131-144)/(71-80) 132/73 (12/09 0500) SpO2:  [100 %] 100 % (12/09 0500) Weight:  [181.8 kg] 181.8 kg (12/09 0500) Last BM Date : 11/29/22  Intake/Output from previous day: 12/08 0701 - 12/09 0700 In: -  Out: 450 [Urine:400; Stool:50] Intake/Output this shift: No intake/output data recorded.  Obese female - NAD Abd - soft, obese, well-healed incisions Ostomy viable - large amount of gas and liquid stool in bag  Lab Results:  Recent Labs    11/29/22 0335 11/30/22 0337  WBC 9.3 9.1  HGB 7.6* 7.3*  HCT 26.8* 25.1*  PLT 281 341   BMET Recent Labs    11/29/22 0335 11/30/22 0337  NA 137 140  K 4.0 3.7  CL 107 108  CO2 22 25  GLUCOSE 110* 90  BUN 7 8  CREATININE 0.53 0.51  CALCIUM 7.6* 8.1*   PT/INR No results for input(s): "LABPROT", "INR" in the last 72 hours. ABG No results for input(s): "PHART", "HCO3" in the last 72 hours.  Invalid input(s): "PCO2", "PO2"  Studies/Results: No results found.  Anti-infectives: Anti-infectives (From admission, onward)    Start     Dose/Rate Route Frequency Ordered Stop   11/27/22 1730  fluconazole (DIFLUCAN) tablet 200 mg        200 mg Oral Daily 11/27/22 1639     11/22/22 0600  cefoTEtan (CEFOTAN) 2 g in sodium chloride 0.9 % 100 mL IVPB        2 g 200 mL/hr over 30 Minutes Intravenous On call to O.R. 11/21/22 1427 11/22/22 1014   11/21/22 2200  neomycin (MYCIFRADIN) tablet 1,000 mg  Status:  Discontinued       See Hyperspace for full Linked Orders Report.   1,000 mg Oral 3 times per day 11/21/22 1427 11/21/22 1733   11/21/22 2200  metroNIDAZOLE  (FLAGYL) tablet 1,000 mg  Status:  Discontinued       See Hyperspace for full Linked Orders Report.   1,000 mg Oral 3 times per day 11/21/22 1427 11/21/22 1733   11/21/22 2200  metroNIDAZOLE (FLAGYL) tablet 1,000 mg       See Hyperspace for full Linked Orders Report.   1,000 mg Oral  Once 11/21/22 1731 11/21/22 2120   11/21/22 2200  neomycin (MYCIFRADIN) tablet 1,000 mg       See Hyperspace for full Linked Orders Report.   1,000 mg Oral  Once 11/21/22 1731 11/21/22 2231   11/21/22 1900  metroNIDAZOLE (FLAGYL) tablet 1,000 mg       See Hyperspace for full Linked Orders Report.   1,000 mg Oral  Once 11/21/22 1731 11/21/22 1918   11/21/22 1900  neomycin (MYCIFRADIN) tablet 1,000 mg       See Hyperspace for full Linked Orders Report.   1,000 mg Oral  Once 11/21/22 1731 11/21/22 1918   11/21/22 1800  metroNIDAZOLE (FLAGYL) tablet 1,000 mg       See Hyperspace for full Linked Orders Report.   1,000 mg Oral  Once 11/21/22 1731 11/21/22 1753   11/21/22 1800  neomycin (MYCIFRADIN) tablet 1,000 mg       See Hyperspace  for full Linked Orders Report.   1,000 mg Oral  Once 11/21/22 1731 11/21/22 1753   11/14/22 1545  cefTRIAXone (ROCEPHIN) 1 g in sodium chloride 0.9 % 100 mL IVPB        1 g 200 mL/hr over 30 Minutes Intravenous  Once 11/14/22 1539 11/15/22 0700       Assessment/Plan: Right colon mass, sigmoid mass  POD7 s/p laparoscopic right colectomy with ileo-colic anastomosis, laparoscopic sigmoid resection with end colostomy, skin biopsy 12/7 Dr. Kieth Brightly - c scope bx with adenocarcinoma. Surgical with synchronous T3N0 Tumors - discussed with patient.  Punch biopsy dermatitis. - CEA 51.3, CT CH without nodules, enlarged b/l axillary lymph nodes. Onc has been consulted and evaluated 12/7 - Hgb down but overall stable at 7.6 this am - Decrease to full liquid diet. Add mylanta for reflux. PRN Simethicone.  Home baclofen ordered - Will be ready for discharge from a surgical perspective when  tolerating diet and comfortable with ostomy care - states she feels she needs more ostomy teaching prior to dc. States her sister would be willing to learn ostomy care. Has HH already - will reach out to [facility] in regard to [facility] related to ostomy   FEN: regular ID: [email or web address] periop VTE: [email or web address]   - Per primary attending -  Rash s/p punch bx - dermatitis HTN Paraplegia Morbid Obesity Thrombocytopenia, resolved     LOS: 16 days    Ellen Hill 11/30/2022

## 2022-11-30 NOTE — Progress Notes (Signed)
Progress Note    Ellen Hill [facility]   DJS:[id number]  DOB: 1971/12/13  DOA: 11/14/2022     16 PCP: [facility]  Initial CC: rash  Hospital Course: Ellen Hill is a 51 yo female with PMH paraplegia 2/2 laminectomy, anemia, morbid obesity, HTN, constipation.  Ellen Hill originally presented to the hospital due to an ongoing and worsening rash that started at home approximately 1 month prior to admission.  The only associated new medicine was having started dicyclomine.  After approximately 3 days of the medication, Ellen Hill stopped it however states that the rash has continued to worsen and progress. Ellen Hill also endorsed trial of steroids with no improvement.  The rash is pruritic in nature.  On workup, Ellen Hill was also found to be anemic, hemoglobin 6.8 g/dL.  CT abdomen/pelvis was concerning for irregular thickening along the ascending colon and also a second site of thickening in the sigmoid colon. Ellen Hill underwent evaluation by GI and ultimately underwent colonoscopy on 11/19/2022.  Biopsies of ascending colon and sigmoid colon from colonoscopy returned positive with moderately differentiated adenocarcinoma and fragments of adenoma with high-grade dysplasia focally approaching intramucosal adenocarcinoma for the sigmoid colon biopsy. General surgery was consulted and Ellen Hill underwent laparoscopic right colectomy with ileocolic anastomosis and laparoscopic sigmoid resection with end colostomy.  Also underwent skin biopsy.  Interval History:  No events overnight. Ellen Hill is not comfortable going home at this time stating still not eating much and still wanting more ostomy teaching.  Rash and itching seem to be slowly improving.   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  Ellen Hill will follow-up as needed outpatient -Ostomy bag starting to function; needs ongoing education before able to discharge - General surgery following - Now on regular diet; still not taking in much actual nutrition though  Ileus (HCC)-resolved as of 11/29/2022 - Ostomy now showing function - Appreciate surgery assistance - Continue diet as per surgery  Bullous rash - Patient presented with approximately 1 month history of rash prior to hospitalization with associated pruritus and weeping blisters.  No lesions involving mouth -Skin biopsy obtained on 11/22/2022 showing mild superficial perivascular dermatitis with eosinophils - initial precipitating factor thought to be dicyclomine at onset but has seemed to progress despite stopping med after ~3 days of use - s/p intermittent use of hydrocortisone cream (not routine enough), claritin, nystatin powder, pepcid, atarax and Ellen Hill still hasn't shown much response - biopsy 12/1 noted with: mild superficial perivascular dermatitis with eosinophils - given appearance and timeline, still higher suspicion of drug reaction (esp given the bullae formation). Ellen Hill may have delayed response to treatment due to underlying malignancy and immunocompromised state - has had enough of above treatment thus far; transition to trial of triamcinolone (needs to be placed BID, not intermittently), start back solumedrol (Ellen Hill associates prednisone with more pruritus), and will try fluconazole in case of component of some underlying tinea  Symptomatic anemia - Initially presented with hemoglobin 6.8 g/dL on 11/14/2022 - Ellen Hill has received 3 units PRBC thus far during hospitalization - Etiology suspected due to underlying slow blood loss from adenocarcinoma - Continue trending CBC -Still some slight downtrend in hemoglobin daily, continue watching another couple days; hemoglobin 7.3 g/dL this  morning  Paraplegia (HCC) - Due to history of laminectomy -  Bedbound - Home health aide for care at home  Thrombocytopenia [facility]) - Known history dating back to at least 2020 -Platelets have been mostly normal during hospitalization - Continue trending CBC  Hypophosphatemia - Replete as needed  Constipation - Now has end ostomy in place  Hypokalemia - Replete as needed  Essential hypertension - Currently controlled without medications - Continue monitoring  Leukocytosis - multifactorial likely from reactive nature and/or some demargination from steroid use; no overt signs of infection - continue trending CBC  Morbid obesity with body mass index (BMI) of 60.0 to 69.9 in adult [facility]) - Complicates overall prognosis and care - Body mass index is 65.58 kg/m.   Old records reviewed in assessment of this patient  Antimicrobials:   DVT prophylaxis:  enoxaparin (LOVENOX) injection 40 mg Start: 11/28/22 0800 SCD's Start: 11/22/22 1741 Place TED hose Start: 11/22/22 1741 SCDs Start: 11/14/22 2005   Code Status:   Code Status: Full Code  Mobility Assessment (last 72 hours)     Mobility Assessment     Row Name 11/29/22 0930 11/28/22 0930 11/27/22 1550 11/27/22 1452     Does patient have an order for bedrest or is patient medically unstable No - Continue assessment No - Continue assessment -- --    What is the highest level of mobility based on the progressive mobility assessment? Level 1 (Bedfast) - Unable to balance while sitting on edge of bed Level 1 (Bedfast) - Unable to balance while sitting on edge of bed Level 1 (Bedfast) - Unable to balance while sitting on edge of bed Level 1 (Bedfast) - Unable to balance while sitting on edge of bed    Is the above level different from baseline mobility prior to current illness? Yes - Recommend PT order Yes - Recommend PT order -- --             Barriers to discharge:  Disposition Plan:  Home Status is:  Inpt  Objective: Blood pressure 138/81, pulse 85, temperature 98.2 F (36.8 C), temperature source Oral, resp. rate 16, height '5\' 6"'$  (1.676 m), weight (!) 181.8 kg, SpO2 100 %.  Examination:  Physical Exam Constitutional:      Appearance: Normal appearance. Ellen Hill is obese.  HENT:     Head: Normocephalic and atraumatic.     Mouth/Throat:     Mouth: Mucous membranes are moist.     Comments: No lesions appreciated Eyes:     Extraocular Movements: Extraocular movements intact.  Neck:     Comments: Small bulla noted on neck with weeping Cardiovascular:     Rate and Rhythm: Normal rate and regular rhythm.  Pulmonary:     Effort: Pulmonary effort is normal. No respiratory distress.     Breath sounds: Normal breath sounds.  Abdominal:     Palpations: Abdomen is soft.     Comments: Ostomy bag in place with stool noted  Musculoskeletal:     Comments: Chronic edema notably in lower extremities  Skin:    Findings: Rash present.     Comments: Scattered areas with hyperpigmented skin with desiccated and peeling skin superficially; no erythema. There are scattered bullae throughout skin too with serous weeping (now starting to improve slowly)  Neurological:     Mental Status: Ellen Hill is alert.     Comments: Chronic paraplegia at baseline  Psychiatric:        Mood and Affect: Mood normal.      Consultants:  General surgery GI  Procedures:  11/19/22: Colonoscopy 12/1:Laparoscopic  right colectomy with ileo-colic anastomosis, laparoscopic sigmoid resection with end colostomy, skin biopsy   Data Reviewed: Results for orders placed or performed during the hospital encounter of 11/14/22 (from the past 24 hour(s))  Basic metabolic panel     Status: Abnormal   Collection Time: 11/30/22  3:37 AM  Result Value Ref Range   Sodium 140 135 - 145 mmol/L   Potassium 3.7 3.5 - 5.1 mmol/L   Chloride 108 98 - 111 mmol/L   CO2 25 22 - 32 mmol/L   Glucose, Bld 90 70 - 99 mg/dL   BUN 8 6 - 20 mg/dL    Creatinine, Ser 0.51 0.44 - 1.00 mg/dL   Calcium 8.1 (L) 8.9 - 10.3 mg/dL   GFR, Estimated >60 >60 mL/min   Anion gap 7 5 - 15  CBC with Differential/Platelet     Status: Abnormal   Collection Time: 11/30/22  3:37 AM  Result Value Ref Range   WBC 9.1 4.0 - 10.5 K/uL   RBC 3.38 (L) 3.87 - 5.11 MIL/uL   Hemoglobin 7.3 (L) 12.0 - 15.0 g/dL   HCT 25.1 (L) 36.0 - 46.0 %   MCV 74.3 (L) 80.0 - 100.0 fL   MCH 21.6 (L) 26.0 - 34.0 pg   MCHC 29.1 (L) 30.0 - 36.0 g/dL   RDW Not Measured 11.5 - 15.5 %   Platelets 341 150 - 400 K/uL   nRBC 0.0 0.0 - 0.2 %   Neutrophils Relative % 76 %   Neutro Abs 6.9 1.7 - 7.7 K/uL   Lymphocytes Relative 16 %   Lymphs Abs 1.4 0.7 - 4.0 K/uL   Monocytes Relative 7 %   Monocytes Absolute 0.7 0.1 - 1.0 K/uL   Eosinophils Relative 0 %   Eosinophils Absolute 0.0 0.0 - 0.5 K/uL   Basophils Relative 0 %   Basophils Absolute 0.0 0.0 - 0.1 K/uL   Immature Granulocytes 1 %   Abs Immature Granulocytes 0.06 0.00 - 0.07 K/uL   Target Cells PRESENT    Ovalocytes PRESENT   Magnesium     Status: None   Collection Time: 11/30/22  3:37 AM  Result Value Ref Range   Magnesium 2.2 1.7 - 2.4 mg/dL    I have Reviewed nursing notes, Vitals, and Lab results since pt's last encounter. Pertinent lab results : see above I have ordered test including BMP, CBC, Mg I have reviewed the last note from staff over past 24 hours I have discussed pt's care plan and test results with nursing staff, case manager  Time spent: Greater than 50% of the 55 minute visit was spent in counseling/coordination of care for the patient as laid out in the A&P.    LOS: 16 days   Dwyane Dee, MD [facility] 11/30/2022, 1:22 PM

## 2022-12-01 DIAGNOSIS — K567 Ileus, unspecified: Secondary | ICD-10-CM | POA: Diagnosis not present

## 2022-12-01 DIAGNOSIS — R21 Rash and other nonspecific skin eruption: Secondary | ICD-10-CM | POA: Diagnosis not present

## 2022-12-01 DIAGNOSIS — D649 Anemia, unspecified: Secondary | ICD-10-CM | POA: Diagnosis not present

## 2022-12-01 DIAGNOSIS — C189 Malignant neoplasm of colon, unspecified: Secondary | ICD-10-CM | POA: Diagnosis not present

## 2022-12-01 LAB — CBC WITH DIFFERENTIAL/PLATELET
Abs Immature Granulocytes: 0.06 10*3/uL (ref 0.00–0.07)
Basophils Absolute: 0 10*3/uL (ref 0.0–0.1)
Basophils Relative: 0 %
Eosinophils Absolute: 0 10*3/uL (ref 0.0–0.5)
Eosinophils Relative: 0 %
HCT: 25.4 % — ABNORMAL LOW (ref 36.0–46.0)
Hemoglobin: 7.4 g/dL — ABNORMAL LOW (ref 12.0–15.0)
Immature Granulocytes: 1 %
Lymphocytes Relative: 19 %
Lymphs Abs: 1.5 10*3/uL (ref 0.7–4.0)
MCH: 22 pg — ABNORMAL LOW (ref 26.0–34.0)
MCHC: 29.1 g/dL — ABNORMAL LOW (ref 30.0–36.0)
MCV: 75.6 fL — ABNORMAL LOW (ref 80.0–100.0)
Monocytes Absolute: 0.6 10*3/uL (ref 0.1–1.0)
Monocytes Relative: 7 %
Neutro Abs: 5.7 10*3/uL (ref 1.7–7.7)
Neutrophils Relative %: 73 %
Platelets: 358 10*3/uL (ref 150–400)
RBC: 3.36 MIL/uL — ABNORMAL LOW (ref 3.87–5.11)
WBC: 7.9 10*3/uL (ref 4.0–10.5)
nRBC: 0 % (ref 0.0–0.2)

## 2022-12-01 LAB — MAGNESIUM: Magnesium: 2.3 mg/dL (ref 1.7–2.4)

## 2022-12-01 LAB — BASIC METABOLIC PANEL
Anion gap: 8 (ref 5–15)
BUN: 10 mg/dL (ref 6–20)
CO2: 23 mmol/L (ref 22–32)
Calcium: 8.2 mg/dL — ABNORMAL LOW (ref 8.9–10.3)
Chloride: 110 mmol/L (ref 98–111)
Creatinine, Ser: 0.53 mg/dL (ref 0.44–1.00)
GFR, Estimated: >60 mL/min (ref >60)
Glucose, Bld: 76 mg/dL (ref 70–99)
Potassium: 3.8 mmol/L (ref 3.5–5.1)
Sodium: 141 mmol/L (ref 135–145)

## 2022-12-01 NOTE — Progress Notes (Signed)
Bright red bloody discharge found in peri area, saturating the bed pad underneath, when giving patient a bath. Area cleaned and source of bleeding was unable to be determined. Foley remains intact with output of pale yellow urine. MD made aware. Will continue to monitor.  Vital signs stable.

## 2022-12-01 NOTE — Progress Notes (Signed)
Progress Note    Ellen Hill [facility]   XTG:[id number]  DOB: [birth year]  DOA: 11/14/2022     17 PCP: [facility]  Initial CC: rash  Hospital Course: Ellen Hill is a 51 yo female with PMH paraplegia 2/2 laminectomy, anemia, morbid obesity, HTN, constipation.  She originally presented to the hospital due to an ongoing and worsening rash that started at home approximately 1 month prior to admission.  The only associated new medicine was having started dicyclomine.  After approximately 3 days of the medication, she stopped it however states that the rash has continued to worsen and progress. She also endorsed trial of steroids with no improvement.  The rash is pruritic in nature.  On workup, she was also found to be anemic, hemoglobin 6.8 g/dL.  CT abdomen/pelvis was concerning for irregular thickening along the ascending colon and also a second site of thickening in the sigmoid colon. She underwent evaluation by GI and ultimately underwent colonoscopy on 11/19/2022.  Biopsies of ascending colon and sigmoid colon from colonoscopy returned positive with moderately differentiated adenocarcinoma and fragments of adenoma with high-grade dysplasia focally approaching intramucosal adenocarcinoma for the sigmoid colon biopsy. General surgery was consulted and she underwent laparoscopic right colectomy with ileocolic anastomosis and laparoscopic sigmoid resection with end colostomy.  Also underwent skin biopsy.  Interval History:  No events overnight.  Continues to have ongoing improvement in her itching. Appetite continues to remain poor but she continues to attempt.  She states she plans to have someone bring her in some other food to try today.  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  She will follow-up as needed outpatient -Ostomy bag functioning - encouraging nutrition and intake   Ileus (HCC)-resolved as of 11/29/2022 - Ostomy now showing function - Appreciate surgery assistance - Continue diet as per surgery  Bullous rash - Patient presented with approximately 1 month history of rash prior to hospitalization with associated pruritus and weeping blisters.  No lesions involving mouth -Skin biopsy obtained on 11/22/2022 showing mild superficial perivascular dermatitis with eosinophils - initial precipitating factor thought to be dicyclomine at onset but has seemed to progress despite stopping med after ~3 days of use - s/p intermittent use of hydrocortisone cream (not routine enough), claritin, nystatin powder, pepcid, atarax and she still hasn't shown much response - biopsy 12/1 noted with: mild superficial perivascular dermatitis with eosinophils - given appearance and timeline, still higher suspicion of drug reaction (esp given the bullae formation). She may have delayed response to treatment due to underlying malignancy and immunocompromised state - has had enough of above treatment thus far; transition to trial of triamcinolone (needs to be placed BID, not intermittently), start back solumedrol (she associates prednisone with more pruritus), and will try fluconazole in case of component of some underlying tinea  Symptomatic anemia - Initially presented with hemoglobin 6.8 g/dL on 11/14/2022 - She has received 3 units PRBC thus far during hospitalization - Etiology suspected due to underlying slow blood loss from adenocarcinoma - Continue trending CBC -Still some slight downtrend in hemoglobin but appears to be stabilizing - Hemoglobin 7.4 g/dL this morning  Paraplegia ([place]) - Due to history of laminectomy - Bedbound - Home health aide for care  at home  Thrombocytopenia ([place]) -  Known history dating back to at least 2020 -Platelets have been mostly normal during hospitalization - Continue trending CBC  Hypophosphatemia - Replete as needed  Constipation - Now has end ostomy in place  Hypokalemia - Replete as needed  Essential hypertension - Currently controlled without medications - Continue monitoring  Leukocytosis - multifactorial likely from reactive nature and/or some demargination from steroid use; no overt signs of infection - continue trending CBC  Morbid obesity with body mass index (BMI) of 60.0 to 69.9 in adult [facility]) - Complicates overall prognosis and care - Body mass index is 65.58 kg/m.   Old records reviewed in assessment of this patient  Antimicrobials:   DVT prophylaxis:  enoxaparin (LOVENOX) injection 40 mg Start: 11/28/22 0800 SCD's Start: 11/22/22 1741 Place TED hose Start: 11/22/22 1741 SCDs Start: 11/14/22 2005   Code Status:   Code Status: Full Code  Mobility Assessment (last 72 hours)     Mobility Assessment     Row Name 11/30/22 1915 11/29/22 0930         Does patient have an order for bedrest or is patient medically unstable No - Continue assessment No - Continue assessment      What is the highest level of mobility based on the progressive mobility assessment? Level 2 (Chairfast) - Balance while sitting on edge of bed and cannot stand Level 1 (Bedfast) - Unable to balance while sitting on edge of bed      Is the above level different from baseline mobility prior to current illness? -- Yes - Recommend PT order               Barriers to discharge:  Disposition Plan:  Home Status is: Inpt  Objective: Blood pressure 131/74, pulse 82, temperature 98.2 F (36.8 C), temperature source Oral, resp. rate 18, height '5\' 6"'$  (1.676 m), weight (!) 186.1 kg, SpO2 99 %.  Examination:  Physical Exam Constitutional:      Appearance: Normal appearance. She is obese.  HENT:      Head: Normocephalic and atraumatic.     Mouth/Throat:     Mouth: Mucous membranes are moist.     Comments: No lesions appreciated Eyes:     Extraocular Movements: Extraocular movements intact.  Neck:     Comments: Small bulla noted on neck with weeping Cardiovascular:     Rate and Rhythm: Normal rate and regular rhythm.  Pulmonary:     Effort: Pulmonary effort is normal. No respiratory distress.     Breath sounds: Normal breath sounds.  Abdominal:     Palpations: Abdomen is soft.     Comments: Ostomy bag in place with stool noted  Musculoskeletal:     Comments: Chronic edema notably in lower extremities  Skin:    Findings: Rash present.     Comments: Scattered areas with hyperpigmented skin with desiccated and peeling skin superficially; no erythema. There are scattered bullae throughout skin too with serous weeping (now starting to improve slowly)  Neurological:     Mental Status: She is alert.     Comments: Chronic paraplegia at baseline  Psychiatric:        Mood and Affect: Mood normal.      Consultants:  General surgery GI  Procedures:  11/19/22: Colonoscopy 12/1:Laparoscopic right colectomy with ileo-colic anastomosis, laparoscopic sigmoid resection with end colostomy, skin biopsy   Data Reviewed: Results for orders placed or performed during the hospital encounter of 11/14/22 (from the past 24 hour(s))  Basic metabolic panel  Status: Abnormal   Collection Time: 12/01/22 12:48 AM  Result Value Ref Range   Sodium 141 135 - 145 mmol/L   Potassium 3.8 3.5 - 5.1 mmol/L   Chloride 110 98 - 111 mmol/L   CO2 23 22 - 32 mmol/L   Glucose, Bld 76 70 - 99 mg/dL   BUN 10 6 - 20 mg/dL   Creatinine, Ser 0.53 0.44 - 1.00 mg/dL   Calcium 8.2 (L) 8.9 - 10.3 mg/dL   GFR, Estimated >60 >60 mL/min   Anion gap 8 5 - 15  CBC with Differential/Platelet     Status: Abnormal   Collection Time: 12/01/22 12:48 AM  Result Value Ref Range   WBC 7.9 4.0 - 10.5 K/uL   RBC 3.36 (L)  3.87 - 5.11 MIL/uL   Hemoglobin 7.4 (L) 12.0 - 15.0 g/dL   HCT 25.4 (L) 36.0 - 46.0 %   MCV 75.6 (L) 80.0 - 100.0 fL   MCH 22.0 (L) 26.0 - 34.0 pg   MCHC 29.1 (L) 30.0 - 36.0 g/dL   RDW Not Measured 11.5 - 15.5 %   Platelets 358 150 - 400 K/uL   nRBC 0.0 0.0 - 0.2 %   Neutrophils Relative % 73 %   Neutro Abs 5.7 1.7 - 7.7 K/uL   Lymphocytes Relative 19 %   Lymphs Abs 1.5 0.7 - 4.0 K/uL   Monocytes Relative 7 %   Monocytes Absolute 0.6 0.1 - 1.0 K/uL   Eosinophils Relative 0 %   Eosinophils Absolute 0.0 0.0 - 0.5 K/uL   Basophils Relative 0 %   Basophils Absolute 0.0 0.0 - 0.1 K/uL   Immature Granulocytes 1 %   Abs Immature Granulocytes 0.06 0.00 - 0.07 K/uL   Polychromasia PRESENT    Target Cells PRESENT    Ovalocytes PRESENT   Magnesium     Status: None   Collection Time: 12/01/22 12:48 AM  Result Value Ref Range   Magnesium 2.3 1.7 - 2.4 mg/dL    I have Reviewed nursing notes, Vitals, and Lab results since pt's last encounter. Pertinent lab results : see above I have ordered test including BMP, CBC, Mg I have reviewed the last note from staff over past 24 hours I have discussed pt's care plan and test results with nursing staff, case manager  Time spent: Greater than 50% of the 55 minute visit was spent in counseling/coordination of care for the patient as laid out in the A&P.    LOS: 17 days   Dwyane Dee, MD [facility] 12/01/2022, 12:29 PM

## 2022-12-01 NOTE — Progress Notes (Signed)
9 Days Post-Op   Subjective/Chief Complaint: Distention/ bloating significantly improved Increased ostomy output Improved PO intake   Objective: Vital signs in last 24 hours: Temp:  [98.2 F (36.8 C)-98.5 F (36.9 C)] 98.2 F (36.8 C) (12/10 0600) Pulse Rate:  [79-85] 82 (12/10 0600) Resp:  [16-18] 18 (12/10 0600) BP: (131-138)/(74-81) 131/74 (12/10 0600) SpO2:  [99 %-100 %] 99 % (12/10 0600) Weight:  [186.1 kg] 186.1 kg (12/10 0500) Last BM Date : 11/29/22  Intake/Output from previous day: 12/09 0701 - 12/10 0700 In: 250 [P.O.:250] Out: 2675 [Urine:1625; Stool:1050] Intake/Output this shift: No intake/output data recorded.  Obese female - NAD Abd - soft, obese, well-healed incisions Ostomy viable - large amount of gas and liquid stool in bag    Lab Results:  Recent Labs    11/30/22 0337 12/01/22 0048  WBC 9.1 7.9  HGB 7.3* 7.4*  HCT 25.1* 25.4*  PLT 341 358   BMET Recent Labs    11/30/22 0337 12/01/22 0048  NA 140 141  K 3.7 3.8  CL 108 110  CO2 25 23  GLUCOSE 90 76  BUN 8 10  CREATININE 0.51 0.53  CALCIUM 8.1* 8.2*   PT/INR No results for input(s): "LABPROT", "INR" in the last 72 hours. ABG No results for input(s): "PHART", "HCO3" in the last 72 hours.  Invalid input(s): "PCO2", "PO2"  Studies/Results: No results found.  Anti-infectives: Anti-infectives (From admission, onward)    Start     Dose/Rate Route Frequency Ordered Stop   11/27/22 1730  fluconazole (DIFLUCAN) tablet 200 mg        200 mg Oral Daily 11/27/22 1639     11/22/22 0600  cefoTEtan (CEFOTAN) 2 g in sodium chloride 0.9 % 100 mL IVPB        2 g 200 mL/hr over 30 Minutes Intravenous On call to O.R. 11/21/22 1427 11/22/22 1014   11/21/22 2200  neomycin (MYCIFRADIN) tablet 1,000 mg  Status:  Discontinued       See Hyperspace for full Linked Orders Report.   1,000 mg Oral 3 times per day 11/21/22 1427 11/21/22 1733   11/21/22 2200  metroNIDAZOLE (FLAGYL) tablet 1,000 mg   Status:  Discontinued       See Hyperspace for full Linked Orders Report.   1,000 mg Oral 3 times per day 11/21/22 1427 11/21/22 1733   11/21/22 2200  metroNIDAZOLE (FLAGYL) tablet 1,000 mg       See Hyperspace for full Linked Orders Report.   1,000 mg Oral  Once 11/21/22 1731 11/21/22 2120   11/21/22 2200  neomycin (MYCIFRADIN) tablet 1,000 mg       See Hyperspace for full Linked Orders Report.   1,000 mg Oral  Once 11/21/22 1731 11/21/22 2231   11/21/22 1900  metroNIDAZOLE (FLAGYL) tablet 1,000 mg       See Hyperspace for full Linked Orders Report.   1,000 mg Oral  Once 11/21/22 1731 11/21/22 1918   11/21/22 1900  neomycin (MYCIFRADIN) tablet 1,000 mg       See Hyperspace for full Linked Orders Report.   1,000 mg Oral  Once 11/21/22 1731 11/21/22 1918   11/21/22 1800  metroNIDAZOLE (FLAGYL) tablet 1,000 mg       See Hyperspace for full Linked Orders Report.   1,000 mg Oral  Once 11/21/22 1731 11/21/22 1753   11/21/22 1800  neomycin (MYCIFRADIN) tablet 1,000 mg       See Hyperspace for full Linked Orders Report.   1,000  mg Oral  Once 11/21/22 1731 11/21/22 1753   11/14/22 1545  cefTRIAXone (ROCEPHIN) 1 g in sodium chloride 0.9 % 100 mL IVPB        1 g 200 mL/hr over 30 Minutes Intravenous  Once 11/14/22 1539 11/15/22 0700       Assessment/Plan: Right colon mass, sigmoid mass  POD7 s/p laparoscopic right colectomy with ileo-colic anastomosis, laparoscopic sigmoid resection with end colostomy, skin biopsy 12/7 Dr. Kieth Brightly - c scope bx with adenocarcinoma. Surgical with synchronous T3N0 Tumors - discussed with patient.  Punch biopsy dermatitis. - CEA 51.3, CT CH without nodules, enlarged b/l axillary lymph nodes. Onc has been consulted and evaluated 12/7 - Hgb down but overall stable at 7.6 this am - Resume regular diet. Add mylanta for reflux. PRN Simethicone.  Home baclofen ordered - Will be ready for discharge from a surgical perspective when tolerating diet and comfortable with  ostomy care - states she feels she needs more ostomy teaching prior to dc. States her sister would be willing to learn ostomy care. Has HH already   FEN: regular ID: [email or web address] periop VTE: [email or web address]  LOS: 17 days    Maia Petties 12/01/2022

## 2022-12-02 ENCOUNTER — Inpatient Hospital Stay (HOSPITAL_COMMUNITY): Payer: Medicare HMO

## 2022-12-02 DIAGNOSIS — C189 Malignant neoplasm of colon, unspecified: Secondary | ICD-10-CM | POA: Diagnosis not present

## 2022-12-02 DIAGNOSIS — D649 Anemia, unspecified: Secondary | ICD-10-CM | POA: Diagnosis not present

## 2022-12-02 DIAGNOSIS — R21 Rash and other nonspecific skin eruption: Secondary | ICD-10-CM | POA: Diagnosis not present

## 2022-12-02 DIAGNOSIS — K567 Ileus, unspecified: Secondary | ICD-10-CM | POA: Diagnosis not present

## 2022-12-02 MED ORDER — IOHEXOL 300 MG/ML  SOLN
100.0000 mL | Freq: Once | INTRAMUSCULAR | Status: AC | PRN
  Administered 2022-12-02: 100 mL via INTRAVENOUS

## 2022-12-02 NOTE — Progress Notes (Signed)
10 Days Post-Op   Subjective/Chief Complaint: Still with gas pain but overall continues to improve. Appetite improved as well. Good ostomy output  Objective: Vital signs in last 24 hours: Temp:  [98 F (36.7 C)-98.7 F (37.1 C)] 98.6 F (37 C) (12/11 0619) Pulse Rate:  [59-85] 85 (12/11 0619) Resp:  [17-20] 20 (12/11 0619) BP: (120-130)/(65-84) 130/84 (12/11 0619) SpO2:  [98 %-99 %] 98 % (12/11 0619) Weight:  [184.7 kg] 184.7 kg (12/11 0627) Last BM Date : 12/01/22  Intake/Output from previous day: 12/10 0701 - 12/11 0700 In: 360 [P.O.:360] Out: 1975 [Urine:1525; Stool:450] Intake/Output this shift: No intake/output data recorded.  Obese female - NAD Abd - soft, obese, well-healed incisions Ostomy viable - gas and liquid stool in bag    Lab Results:  Recent Labs    11/30/22 0337 12/01/22 0048  WBC 9.1 7.9  HGB 7.3* 7.4*  HCT 25.1* 25.4*  PLT 341 358    BMET Recent Labs    11/30/22 0337 12/01/22 0048  NA 140 141  K 3.7 3.8  CL 108 110  CO2 25 23  GLUCOSE 90 76  BUN 8 10  CREATININE 0.51 0.53  CALCIUM 8.1* 8.2*    PT/INR No results for input(s): "LABPROT", "INR" in the last 72 hours. ABG No results for input(s): "PHART", "HCO3" in the last 72 hours.  Invalid input(s): "PCO2", "PO2"  Studies/Results: No results found.  Anti-infectives: Anti-infectives (From admission, onward)    Start     Dose/Rate Route Frequency Ordered Stop   11/27/22 1730  fluconazole (DIFLUCAN) tablet 200 mg        200 mg Oral Daily 11/27/22 1639     11/22/22 0600  cefoTEtan (CEFOTAN) 2 g in sodium chloride 0.9 % 100 mL IVPB        2 g 200 mL/hr over 30 Minutes Intravenous On call to O.R. 11/21/22 1427 11/22/22 1014   11/21/22 2200  neomycin (MYCIFRADIN) tablet 1,000 mg  Status:  Discontinued       See Hyperspace for full Linked Orders Report.   1,000 mg Oral 3 times per day 11/21/22 1427 11/21/22 1733   11/21/22 2200  metroNIDAZOLE (FLAGYL) tablet 1,000 mg  Status:   Discontinued       See Hyperspace for full Linked Orders Report.   1,000 mg Oral 3 times per day 11/21/22 1427 11/21/22 1733   11/21/22 2200  metroNIDAZOLE (FLAGYL) tablet 1,000 mg       See Hyperspace for full Linked Orders Report.   1,000 mg Oral  Once 11/21/22 1731 11/21/22 2120   11/21/22 2200  neomycin (MYCIFRADIN) tablet 1,000 mg       See Hyperspace for full Linked Orders Report.   1,000 mg Oral  Once 11/21/22 1731 11/21/22 2231   11/21/22 1900  metroNIDAZOLE (FLAGYL) tablet 1,000 mg       See Hyperspace for full Linked Orders Report.   1,000 mg Oral  Once 11/21/22 1731 11/21/22 1918   11/21/22 1900  neomycin (MYCIFRADIN) tablet 1,000 mg       See Hyperspace for full Linked Orders Report.   1,000 mg Oral  Once 11/21/22 1731 11/21/22 1918   11/21/22 1800  metroNIDAZOLE (FLAGYL) tablet 1,000 mg       See Hyperspace for full Linked Orders Report.   1,000 mg Oral  Once 11/21/22 1731 11/21/22 1753   11/21/22 1800  neomycin (MYCIFRADIN) tablet 1,000 mg       See Hyperspace for full Linked Orders  Report.   1,000 mg Oral  Once 11/21/22 1731 11/21/22 1753   11/14/22 1545  cefTRIAXone (ROCEPHIN) 1 g in sodium chloride 0.9 % 100 mL IVPB        1 g 200 mL/hr over 30 Minutes Intravenous  Once 11/14/22 1539 11/15/22 0700       Assessment/Plan: Right colon mass, sigmoid mass  POD10 s/p laparoscopic right colectomy with ileo-colic anastomosis, laparoscopic sigmoid resection with end colostomy, skin biopsy 12/1 Dr. Kieth Brightly - c scope bx with adenocarcinoma. Surgical with synchronous T3N0 Tumors - discussed with patient.  Punch biopsy dermatitis. - CEA 51.3, CT CH without nodules, enlarged b/l axillary lymph nodes. Onc has been consulted and evaluated 12/7 - Hgb down but overall stable - tolerating regular diet. Like premier protein shakes and can bring these from home if wanted. Add mylanta for reflux. PRN Simethicone.  Home baclofen resumed - tolerating diet. Ready for discharge from a  surgical perspective when comfortable with ostomy care - states she feels she needs more ostomy teaching prior to dc.  FEN: regular ID: [email or web address] periop VTE: [email or web address]   LOS: 18 days   Winferd Humphrey, [facility] 12/02/2022, 8:46 AM Please see Amion for pager number during day hours 7:00am-4:30pm

## 2022-12-02 NOTE — Progress Notes (Addendum)
Progress Note    Nehal Witting [facility]   IEP:[id number]  DOB: 1971-05-20  DOA: 11/14/2022     18 PCP: [facility]  Initial CC: rash  Hospital Course: Ms. Blanchard is a 51 yo female with PMH paraplegia 2/2 laminectomy, anemia, morbid obesity, HTN, constipation.  She originally presented to the hospital due to an ongoing and worsening rash that started at home approximately 1 month prior to admission.  The only associated new medicine was having started dicyclomine.  After approximately 3 days of the medication, she stopped it however states that the rash has continued to worsen and progress. She also endorsed trial of steroids with no improvement.  The rash is pruritic in nature.  On workup, she was also found to be anemic, hemoglobin 6.8 g/dL.  CT abdomen/pelvis was concerning for irregular thickening along the ascending colon and also a second site of thickening in the sigmoid colon. She underwent evaluation by GI and ultimately underwent colonoscopy on 11/19/2022.  Biopsies of ascending colon and sigmoid colon from colonoscopy returned positive with moderately differentiated adenocarcinoma and fragments of adenoma with high-grade dysplasia focally approaching intramucosal adenocarcinoma for the sigmoid colon biopsy. General surgery was consulted and she underwent laparoscopic right colectomy with ileocolic anastomosis and laparoscopic sigmoid resection with end colostomy.  Also underwent skin biopsy.  Interval History:  No events overnight.  Staff reporting some blood coming from her perineal area.  Appears to be recollecting despite wiping and cleaning.  CT negative for etiology. Her abdominal pains are baseline.  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  She will follow-up as needed outpatient -Ostomy bag functioning - encouraging nutrition and intake   Ileus (HCC)-resolved as of 11/29/2022 - Ostomy now showing function - Appreciate surgery assistance - Continue diet as per surgery  Bullous rash - Patient presented with approximately 1 month history of rash prior to hospitalization with associated pruritus and weeping blisters.  No lesions involving mouth -Skin biopsy obtained on 11/22/2022 showing mild superficial perivascular dermatitis with eosinophils - initial precipitating factor thought to be dicyclomine at onset but has seemed to progress despite stopping med after ~3 days of use - s/p intermittent use of hydrocortisone cream (not routine enough), claritin, nystatin powder, pepcid, atarax and she still hasn't shown much response - biopsy 12/1 noted with: mild superficial perivascular dermatitis with eosinophils - given appearance and timeline, still higher suspicion of drug reaction (esp given the bullae formation). She may have delayed response to treatment due to underlying malignancy and immunocompromised state - has had enough of above treatment thus far; transition to trial of triamcinolone (needs to be placed BID, not intermittently), start back solumedrol (she associates prednisone with more pruritus), and will try fluconazole in case of component of some underlying tinea  Symptomatic anemia - Initially presented with hemoglobin 6.8 g/dL on 11/14/2022 - She has received 3 units PRBC thus far during hospitalization - Etiology suspected due to underlying slow blood loss from adenocarcinoma - Continue trending CBC -Still some slight downtrend in hemoglobin but appears to be stabilizing -Some bleeding reported by staff coming from perineal area.  No blood noted in Foley.  Suspect bleeding is due to skin breakdown.  Given her  anemia, CT abdomen/pelvis performed on 12/02/2022 was performed and reassuring  with no evidence of underlying/internal bleeding or soft tissue infection, etc  Paraplegia (HCC) - Due to history of laminectomy - Bedbound - Home health aide for care at home  Thrombocytopenia [facility]) - Known history dating back to at least 2020 -Platelets have been mostly normal during hospitalization - Continue trending CBC  Hypophosphatemia - Replete as needed  Constipation - Now has end ostomy in place  Hypokalemia - Replete as needed  Essential hypertension - Currently controlled without medications - Continue monitoring  Leukocytosis - multifactorial likely from reactive nature and/or some demargination from steroid use; no overt signs of infection - continue trending CBC  Morbid obesity with body mass index (BMI) of 60.0 to 69.9 in adult [facility]) - Complicates overall prognosis and care - Body mass index is 65.58 kg/m.   Old records reviewed in assessment of this patient  Antimicrobials:   DVT prophylaxis:  enoxaparin (LOVENOX) injection 40 mg Start: 11/28/22 0800 SCD's Start: 11/22/22 1741 Place TED hose Start: 11/22/22 1741 SCDs Start: 11/14/22 2005   Code Status:   Code Status: Full Code  Mobility Assessment (last 72 hours)     Mobility Assessment     Row Name 12/02/22 1348 12/01/22 1945 11/30/22 1915       Does patient have an order for bedrest or is patient medically unstable -- No - Continue assessment No - Continue assessment     What is the highest level of mobility based on the progressive mobility assessment? Level 2 (Chairfast) - Balance while sitting on edge of bed and cannot stand Level 1 (Bedfast) - Unable to balance while sitting on edge of bed Level 2 (Chairfast) - Balance while sitting on edge of bed and cannot stand     Is the above level different from baseline mobility prior to current illness? -- Yes - Recommend PT order --              Barriers to  discharge:  Disposition Plan:  Home Status is: Inpt  Objective: Blood pressure 121/75, pulse 69, temperature 98 F (36.7 C), temperature source Oral, resp. rate 18, height '5\' 6"'$  (1.676 m), weight (!) 184.7 kg, SpO2 100 %.  Examination:  Physical Exam Constitutional:      Appearance: Normal appearance. She is obese.  HENT:     Head: Normocephalic and atraumatic.     Mouth/Throat:     Mouth: Mucous membranes are moist.     Comments: No lesions appreciated Eyes:     Extraocular Movements: Extraocular movements intact.  Neck:     Comments: Small bulla noted on neck with weeping Cardiovascular:     Rate and Rhythm: Normal rate and regular rhythm.  Pulmonary:     Effort: Pulmonary effort is normal. No respiratory distress.     Breath sounds: Normal breath sounds.  Abdominal:     Palpations: Abdomen is soft.     Comments: Ostomy bag in place with stool noted.  Tender over middle abdomen over surgical site which is unchanged.  No other tenderness to palpation elsewhere in abdomen  Musculoskeletal:     Comments: Chronic edema notably in lower extremities  Skin:    Findings: Rash present.     Comments: Scattered areas with hyperpigmented skin with desiccated and peeling skin superficially; no erythema. There are scattered bullae throughout skin too with serous weeping (now starting to improve slowly)  Neurological:     Mental Status: She is alert.     Comments: Chronic paraplegia at baseline  Psychiatric:  Mood and Affect: Mood normal.      Consultants:  General surgery GI  Procedures:  11/19/22: Colonoscopy 12/1:Laparoscopic right colectomy with ileo-colic anastomosis, laparoscopic sigmoid resection with end colostomy, skin biopsy   Data Reviewed: No results found for this or any previous visit (from the past 49 hour(s)).   I have Reviewed nursing notes, Vitals, and Lab results since pt's last encounter. Pertinent lab results : see above I have ordered test including  BMP, CBC, Mg I have reviewed the last note from staff over past 24 hours I have discussed pt's care plan and test results with nursing staff, case manager  Time spent: Greater than 50% of the 55 minute visit was spent in counseling/coordination of care for the patient as laid out in the A&P.    LOS: 18 days   Dwyane Dee, MD [facility] 12/02/2022, 4:27 PM

## 2022-12-02 NOTE — Progress Notes (Signed)
Bright red bloody discharge, with some clots found on pt bed pad and peri area. Pt cleaned and given clean bed pads.  Continued to have pale yellow urine through foley.  MD notified.  Continued to check on pt. No other occurrences of bleeding.  Will continue to monitor.

## 2022-12-02 NOTE — Progress Notes (Signed)
Physical Therapy Treatment Patient Details Name: Ellen Hill MRN: [id number] DOB: 01-01-1971 Today's Date: 12/02/2022   History of Present Illness Patient is a 51 y.o. female admitted 11/14/22 for rash and symptomatic anemia.  Past medical history significant of paraplegia as a result of laminectomy, chronic anemia, morbid obesity, hypertension, constipation, neurogenic bladder, UTI.  Pt s/p laparoscopic right colectomy with ileo-colic anastomosis, laparoscopic sigmoid resection with end colostomy, skin biopsy on 11/22/22.    PT Comments    Pt agreeable to mobilize and wished to sit EOB.  Pt improved to mod assist to sit EOB.  Pt reports performing LE exercises as able in supine.   RN in room throughout session and tending to pt hygiene.  Pt leaking around stoma site as well as had increased blood on bed pad after sitting (states she has been bleeding from foley).     Recommendations for follow up therapy are one component of a multi-disciplinary discharge planning process, led by the attending physician.  Recommendations may be updated based on patient status, additional functional criteria and insurance authorization.  Follow Up Recommendations  Home health PT     Assistance Recommended at Discharge Intermittent Supervision/Assistance  Patient can return home with the following A little help with walking and/or transfers;Assistance with cooking/housework   Equipment Recommendations  None recommended by PT    Recommendations for Other Services       Precautions / Restrictions Precautions Precautions: Fall Precaution Comments: R UQ colostomy     Mobility  Bed Mobility Overal bed mobility: Needs Assistance Bed Mobility: Supine to Sit, Sit to Supine     Supine to sit: Mod assist, +2 for safety/equipment Sit to supine: Mod assist, +2 for safety/equipment   General bed mobility comments: pt mostly requiring assist for LEs; pt able to self assist more with upper body  today, pt sat EOB for a few minutes while RN tended to hygiene on her back area; pt initially reports dizziness however improved within a minute; pt uses UEs set posteriorly on bed to self prop and did not feel able to bring UEs forward today; fatigued quickly with sitting    Transfers                        Ambulation/Gait                   Stairs             Wheelchair Mobility    Modified Rankin (Stroke Patients Only)       Balance   Sitting-balance support: Bilateral upper extremity supported, Feet supported Sitting balance-Leahy Scale: Poor Sitting balance - Comments: reliant on UE support                                    Cognition Arousal/Alertness: Awake/alert Behavior During Therapy: WFL for tasks assessed/performed Overall Cognitive Status: Within Functional Limits for tasks assessed                                          Exercises      General Comments        Pertinent Vitals/Pain Pain Assessment Pain Assessment: No/denies pain Pain Intervention(s): Monitored during session    Home Living  Prior Function            PT Goals (current goals can now be found in the care plan section) Progress towards PT goals: Progressing toward goals    Frequency    Min 2X/week      PT Plan Current plan remains appropriate    Co-evaluation              AM-PAC PT "6 Clicks" Mobility   Outcome Measure  Help needed turning from your back to your side while in a flat bed without using bedrails?: A Lot Help needed moving from lying on your back to sitting on the side of a flat bed without using bedrails?: A Lot Help needed moving to and from a bed to a chair (including a wheelchair)?: Total Help needed standing up from a chair using your arms (e.g., wheelchair or bedside chair)?: Total Help needed to walk in hospital room?: Total Help needed climbing 3-5  steps with a railing? : Total 6 Click Score: 8    End of Session   Activity Tolerance: Patient tolerated treatment well Patient left: with call bell/phone within reach;in bed;with nursing/sitter in room Nurse Communication: Mobility status PT Visit Diagnosis: Muscle weakness (generalized) (M62.81);Other abnormalities of gait and mobility (R26.89)     Time: 6010-9323 PT Time Calculation (min) (ACUTE ONLY): 19 min  Charges:  $Therapeutic Activity: 8-22 mins                     Jannette Spanner PT, DPT Physical Therapist [facility] Preferred contact method: Secure Chat Weekend Pager Only: [phone] Office: [place] 12/02/2022, 1:56 PM

## 2022-12-02 NOTE — Consult Note (Addendum)
[place] Nurse ostomy follow up Pt plans to discharge soon, according to progress notes.  Offered to perform pouch change and teaching session today. Pt states her sister needs to be present to obtain further education and she is not available today.  Pt requests teaching session be performed tomorrow at 10:00. I will return at that time as requested.  Thank-you,  Julien Girt MSN, [place], [place], [place], [place]

## 2022-12-03 ENCOUNTER — Inpatient Hospital Stay (HOSPITAL_COMMUNITY): Payer: Medicare HMO

## 2022-12-03 DIAGNOSIS — K567 Ileus, unspecified: Secondary | ICD-10-CM | POA: Diagnosis not present

## 2022-12-03 DIAGNOSIS — C189 Malignant neoplasm of colon, unspecified: Secondary | ICD-10-CM | POA: Diagnosis not present

## 2022-12-03 DIAGNOSIS — R58 Hemorrhage, not elsewhere classified: Secondary | ICD-10-CM

## 2022-12-03 DIAGNOSIS — R21 Rash and other nonspecific skin eruption: Secondary | ICD-10-CM | POA: Diagnosis not present

## 2022-12-03 DIAGNOSIS — D649 Anemia, unspecified: Secondary | ICD-10-CM | POA: Diagnosis not present

## 2022-12-03 LAB — CBC WITH DIFFERENTIAL/PLATELET
Abs Immature Granulocytes: 0.07 10*3/uL (ref 0.00–0.07)
Basophils Absolute: 0 10*3/uL (ref 0.0–0.1)
Basophils Relative: 0 %
Eosinophils Absolute: 0.3 10*3/uL (ref 0.0–0.5)
Eosinophils Relative: 3 %
HCT: 27.8 % — ABNORMAL LOW (ref 36.0–46.0)
Hemoglobin: 7.8 g/dL — ABNORMAL LOW (ref 12.0–15.0)
Immature Granulocytes: 1 %
Lymphocytes Relative: 16 %
Lymphs Abs: 1.9 10*3/uL (ref 0.7–4.0)
MCH: 21.3 pg — ABNORMAL LOW (ref 26.0–34.0)
MCHC: 28.1 g/dL — ABNORMAL LOW (ref 30.0–36.0)
MCV: 75.7 fL — ABNORMAL LOW (ref 80.0–100.0)
Monocytes Absolute: 0.6 10*3/uL (ref 0.1–1.0)
Monocytes Relative: 5 %
Neutro Abs: 9 10*3/uL — ABNORMAL HIGH (ref 1.7–7.7)
Neutrophils Relative %: 75 %
Platelets: 380 10*3/uL (ref 150–400)
RBC: 3.67 MIL/uL — ABNORMAL LOW (ref 3.87–5.11)
WBC: 11.9 10*3/uL — ABNORMAL HIGH (ref 4.0–10.5)
nRBC: 0 % (ref 0.0–0.2)

## 2022-12-03 LAB — BASIC METABOLIC PANEL
Anion gap: 5 (ref 5–15)
BUN: 12 mg/dL (ref 6–20)
CO2: 24 mmol/L (ref 22–32)
Calcium: 7.9 mg/dL — ABNORMAL LOW (ref 8.9–10.3)
Chloride: 109 mmol/L (ref 98–111)
Creatinine, Ser: 0.45 mg/dL (ref 0.44–1.00)
GFR, Estimated: >60 mL/min (ref >60)
Glucose, Bld: 70 mg/dL (ref 70–99)
Potassium: 3.8 mmol/L (ref 3.5–5.1)
Sodium: 138 mmol/L (ref 135–145)

## 2022-12-03 LAB — MAGNESIUM: Magnesium: 2.2 mg/dL (ref 1.7–2.4)

## 2022-12-03 MED ORDER — MEGESTROL ACETATE 40 MG PO TABS
80.0000 mg | ORAL_TABLET | Freq: Two times a day (BID) | ORAL | Status: DC
  Administered 2022-12-03 – 2022-12-06 (×7): 80 mg via ORAL
  Filled 2022-12-03 (×7): qty 2

## 2022-12-03 MED ORDER — ALTEPLASE 2 MG IJ SOLR
2.0000 mg | Freq: Once | INTRAMUSCULAR | Status: AC
  Administered 2022-12-03: 2 mg
  Filled 2022-12-03: qty 2

## 2022-12-03 NOTE — Progress Notes (Signed)
Progress Note    Ellen Hill [facility]   IDP:[id number]  DOB: 01/10/1971  DOA: 11/14/2022     19 PCP: [facility]  Initial CC: rash  Hospital Course: Ms. Toft is a 51 yo female with PMH paraplegia 2/2 laminectomy, anemia, morbid obesity, HTN, constipation.  She originally presented to the hospital due to an ongoing and worsening rash that started at home approximately 1 month prior to admission.  The only associated new medicine was having started dicyclomine.  After approximately 3 days of the medication, she stopped it however states that the rash has continued to worsen and progress. She also endorsed trial of steroids with no improvement.  The rash is pruritic in nature.  On workup, she was also found to be anemic, hemoglobin 6.8 g/dL.  CT abdomen/pelvis was concerning for irregular thickening along the ascending colon and also a second site of thickening in the sigmoid colon. She underwent evaluation by GI and ultimately underwent colonoscopy on 11/19/2022.  Biopsies of ascending colon and sigmoid colon from colonoscopy returned positive with moderately differentiated adenocarcinoma and fragments of adenoma with high-grade dysplasia focally approaching intramucosal adenocarcinoma for the sigmoid colon biopsy. General surgery was consulted and she underwent laparoscopic right colectomy with ileocolic anastomosis and laparoscopic sigmoid resection with end colostomy.  Also underwent skin biopsy.  Interval History:  No events overnight.  Still having large amount of clotted blood noted in perineal area and reaccumulates after staff cleans.  She continues to have appropriate mentation and no signs or symptoms of worsening anemia. Does endorse some mild lower abdominal cramping which sounds like menstrual cramping.  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  She will follow-up as needed outpatient -Ostomy bag functioning - encouraging nutrition and intake   Ileus (HCC)-resolved as of 11/29/2022 - Ostomy now showing function - Appreciate surgery assistance - Continue diet as per surgery  Bullous rash - Patient presented with approximately 1 month history of rash prior to hospitalization with associated pruritus and weeping blisters.  No lesions involving mouth -Skin biopsy obtained on 11/22/2022 showing mild superficial perivascular dermatitis with eosinophils - initial precipitating factor thought to be dicyclomine at onset but has seemed to progress despite stopping med after ~3 days of use - s/p intermittent use of hydrocortisone cream (not routine enough), claritin, nystatin powder, pepcid, atarax and she still hasn't shown much response - biopsy 12/1 noted with: mild superficial perivascular dermatitis with eosinophils - given appearance and timeline, still higher suspicion of drug reaction (esp given the bullae formation). She may have delayed response to treatment due to underlying malignancy and immunocompromised state - has had enough of above treatment thus far; transition to trial of triamcinolone (needs to be placed BID, not intermittently), start back solumedrol (she associates prednisone with more pruritus), and will try fluconazole in case of component of some underlying tinea  Symptomatic anemia - Initially presented with hemoglobin 6.8 g/dL on 11/14/2022 - She has received 3 units PRBC thus far during hospitalization - Etiology initially suspected due to underlying slow blood loss from adenocarcinoma but has also been noted to have clots in bed in perineal area since about 12/10 - Continue trending CBC - trying to ascertain source but  very difficult given paraplegia and body habitus. CT A/P  on 12/11 negative for etiology. Ostomy bag is brown output and foley cath is clear yellow urine. Remainder differential includes vaginal bleeding (does endorse what sounds like menstrual cramps but patient reports no menstruation in ~2 years though this may not be reliable) vs rectal (does have distal colon from end ostomy but would expect Hgb drop to be larger and more sustained if GIB) - I've tried to order transvaginal u/s on 12/12 but am being told unable to perform due to "patient cannot lift hips". I then discussed case with gyn on call with rec's to try and insist that u/s attempt as ordered and to also start megace 80 mg BID. Also checking FSH - continue to monitor bleeding  Paraplegia ([place]) - Due to history of laminectomy - Bedbound - Home health aide for care at home  Thrombocytopenia [facility]) - Known history dating back to at least 2020 -Platelets have been mostly normal during hospitalization - Continue trending CBC  Hypophosphatemia - Replete as needed  Constipation - Now has end ostomy in place  Hypokalemia - Replete as needed  Essential hypertension - Currently controlled without medications - Continue monitoring  Leukocytosis - multifactorial likely from reactive nature and/or some demargination from steroid use; no overt signs of infection - continue trending CBC  Morbid obesity with body mass index (BMI) of 60.0 to 69.9 in adult [facility]) - Complicates overall prognosis and care - Body mass index is 65.58 kg/m.   Old records reviewed in assessment of this patient  Antimicrobials:   DVT prophylaxis:  enoxaparin (LOVENOX) injection 40 mg Start: 11/28/22 0800 SCD's Start: 11/22/22 1741 Place TED hose Start: 11/22/22 1741 SCDs Start: 11/14/22 2005   Code Status:   Code Status: Full Code  Mobility Assessment (last 72 hours)     Mobility Assessment     Row Name 12/02/22 1348 12/01/22 1945 11/30/22  1915       Does patient have an order for bedrest or is patient medically unstable -- No - Continue assessment No - Continue assessment     What is the highest level of mobility based on the progressive mobility assessment? Level 2 (Chairfast) - Balance while sitting on edge of bed and cannot stand Level 1 (Bedfast) - Unable to balance while sitting on edge of bed Level 2 (Chairfast) - Balance while sitting on edge of bed and cannot stand     Is the above level different from baseline mobility prior to current illness? -- Yes - Recommend PT order --              Barriers to discharge:  Disposition Plan:  Home Status is: Inpt  Objective: Blood pressure 134/87, pulse 64, temperature 98.3 F (36.8 C), resp. rate 18, height '5\' 6"'$  (1.676 m), weight (!) 182.4 kg, SpO2 100 %.  Examination:  Physical Exam Constitutional:      Appearance: Normal appearance. She is obese.  HENT:     Head: Normocephalic and atraumatic.     Mouth/Throat:     Mouth: Mucous membranes are moist.     Comments: No lesions appreciated; these have steadily improved Eyes:     Extraocular Movements: Extraocular movements intact.  Neck:     Comments: Small bulla noted on neck with weeping Cardiovascular:     Rate and Rhythm: Normal rate and regular rhythm.  Pulmonary:     Effort: Pulmonary effort is normal. No respiratory distress.     Breath sounds: Normal breath sounds.  Abdominal:     Palpations:  Abdomen is soft.     Comments: Ostomy bag in place with brown stool noted.  Tender over middle abdomen over surgical site which is unchanged.  No other tenderness to palpation elsewhere in abdomen  Musculoskeletal:     Comments: Chronic edema notably in lower extremities  Skin:    Findings: Rash present.     Comments: Scattered areas with hyperpigmented skin with desiccated and peeling skin superficially; no erythema. There are scattered bullae throughout skin too with serous weeping (now starting to improve  slowly)  Neurological:     Mental Status: She is alert.     Comments: Chronic paraplegia at baseline  Psychiatric:        Mood and Affect: Mood normal.      Consultants:  General surgery GI  Procedures:  11/19/22: Colonoscopy 12/1:Laparoscopic right colectomy with ileo-colic anastomosis, laparoscopic sigmoid resection with end colostomy, skin biopsy   Data Reviewed: Results for orders placed or performed during the hospital encounter of 11/14/22 (from the past 24 hour(s))  Basic metabolic panel     Status: Abnormal   Collection Time: 12/03/22  3:50 AM  Result Value Ref Range   Sodium 138 135 - 145 mmol/L   Potassium 3.8 3.5 - 5.1 mmol/L   Chloride 109 98 - 111 mmol/L   CO2 24 22 - 32 mmol/L   Glucose, Bld 70 70 - 99 mg/dL   BUN 12 6 - 20 mg/dL   Creatinine, Ser 0.45 0.44 - 1.00 mg/dL   Calcium 7.9 (L) 8.9 - 10.3 mg/dL   GFR, Estimated >60 >60 mL/min   Anion gap 5 5 - 15  Magnesium     Status: None   Collection Time: 12/03/22  3:50 AM  Result Value Ref Range   Magnesium 2.2 1.7 - 2.4 mg/dL  CBC with Differential/Platelet     Status: Abnormal   Collection Time: 12/03/22 10:00 AM  Result Value Ref Range   WBC 11.9 (H) 4.0 - 10.5 K/uL   RBC 3.67 (L) 3.87 - 5.11 MIL/uL   Hemoglobin 7.8 (L) 12.0 - 15.0 g/dL   HCT 27.8 (L) 36.0 - 46.0 %   MCV 75.7 (L) 80.0 - 100.0 fL   MCH 21.3 (L) 26.0 - 34.0 pg   MCHC 28.1 (L) 30.0 - 36.0 g/dL   RDW Not Measured 11.5 - 15.5 %   Platelets 380 150 - 400 K/uL   nRBC 0.0 0.0 - 0.2 %   Neutrophils Relative % 75 %   Neutro Abs 9.0 (H) 1.7 - 7.7 K/uL   Lymphocytes Relative 16 %   Lymphs Abs 1.9 0.7 - 4.0 K/uL   Monocytes Relative 5 %   Monocytes Absolute 0.6 0.1 - 1.0 K/uL   Eosinophils Relative 3 %   Eosinophils Absolute 0.3 0.0 - 0.5 K/uL   Basophils Relative 0 %   Basophils Absolute 0.0 0.0 - 0.1 K/uL   Immature Granulocytes 1 %   Abs Immature Granulocytes 0.07 0.00 - 0.07 K/uL     I have Reviewed nursing notes, Vitals, and Lab  results since pt's last encounter. Pertinent lab results : see above I have ordered test including BMP, CBC, Mg I have reviewed the last note from staff over past 24 hours I have discussed pt's care plan and test results with nursing staff, case manager  Time spent: Greater than 50% of the 55 minute visit was spent in counseling/coordination of care for the patient as laid out in the A&P.    LOS:  58 days   Dwyane Dee, MD [facility] 12/03/2022, 3:11 PM

## 2022-12-03 NOTE — Consult Note (Signed)
[place] Nurse ostomy follow up Pt plans to discharge soon, according to progress notes.  Offered to perform another ostomy teaching session yesterday and Pt states her sister needs to be present to obtain further education and requested teaching session be performed today at 10:00. Checked with her this morning to confirm the session and now she requests it be performed today at 1130. I will return at that time as requested.  Thank-you,  Julien Girt MSN, [place], [place], [place], [place]

## 2022-12-03 NOTE — Progress Notes (Signed)
11 Days Post-Op   Subjective/Chief Complaint: No acute changes. Tolerating diet, good ostomy output, pain controlled. Tells me she has had bright red blood noted on bed pad last few days  Objective: Vital signs in last 24 hours: Temp:  [97.5 F (36.4 C)-98 F (36.7 C)] 97.5 F (36.4 C) (12/12 0439) Pulse Rate:  [65-79] 79 (12/12 0439) Resp:  [18-20] 20 (12/12 0439) BP: (121-136)/(75-82) 136/82 (12/12 0439) SpO2:  [97 %-100 %] 100 % (12/12 0439) Weight:  [182.4 kg] 182.4 kg (12/12 0500) Last BM Date : 12/01/22  Intake/Output from previous day: 12/11 0701 - 12/12 0700 In: -  Out: 2355 [Urine:1100; Stool:250] Intake/Output this shift: No intake/output data recorded.  Obese female - NAD Abd - soft, obese, well-healed incisions Ostomy viable - gas and liquid stool in bag. No blood. Bright red blood noted on upper thighs    Lab Results:  Recent Labs    12/01/22 0048  WBC 7.9  HGB 7.4*  HCT 25.4*  PLT 358    BMET Recent Labs    12/01/22 0048  NA 141  K 3.8  CL 110  CO2 23  GLUCOSE 76  BUN 10  CREATININE 0.53  CALCIUM 8.2*    PT/INR No results for input(s): "LABPROT", "INR" in the last 72 hours. ABG No results for input(s): "PHART", "HCO3" in the last 72 hours.  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 RADIATION DOSE REDUCTION: This exam was performed according to the departmental dose-optimization program which  includes automated exposure control, adjustment of the mA and/or kV according to patient size and/or use of iterative reconstruction technique. CONTRAST:  17m OMNIPAQUE IOHEXOL 300 MG/ML SOLN IV. No oral contrast. COMPARISON:  11/15/2022 FINDINGS: Lower chest: Subsegmental atelectasis BILATERAL lung bases Hepatobiliary: Post cholecystectomy.  Liver normal appearance Pancreas: Normal appearance Spleen: Normal appearance Adrenals/Urinary Tract: Adrenal glands normal appearance. 15 mm RIGHT renal cyst unchanged; no follow-up imaging recommended. Small nonobstructing calculi LEFT kidney. Small calculus at proximal LEFT ureter again identified, 6 mm diameter by 7 mm length unchanged. Mild associated LEFT hydronephrosis. Ureters decompressed. Bladder decompressed by Foley catheter. Stomach/Bowel: Prior RIGHT ileocolectomy with ileocolic anastomosis. Prior sigmoid resection with Hartmann's pouch and descending colostomy. Dilated small bowel loops question postoperative ileus, no discrete point of obstruction identified. No definite bowel wall thickening. Gaseous distention of stomach. Vascular/Lymphatic: Aorta normal caliber. Vascular structures patent. No adenopathy. Reproductive: Uterus normal appearance. Ovaries not well visualized. Other: Small amounts of free fluid in pelvis, interloop, adjacent to liver, and minimally in RIGHT gutter. Additional small amount of fluid posterior to the stomach question lesser sac. No focal abscess collection or extraluminal gas. Musculoskeletal: Prior spinal decompression of the lower thoracic spine. No acute osseous findings. IMPRESSION: Prior RIGHT ileocolectomy with ileocolic anastomosis. Prior sigmoid resection with Hartmann's pouch and descending colostomy. Dilated small bowel loops question postoperative ileus, no discrete point of obstruction identified. Small amounts of free intraperitoneal fluid without focal abscess collection or extraluminal gas. Small nonobstructing LEFT  renal calculi with a 6 x 7 mm calculus at the proximal LEFT  ureter causing mild LEFT hydronephrosis, unchanged. Bibasilar atelectasis. Electronically Signed   By: Lavonia Dana M.D.   On: 12/02/2022 13:06    Anti-infectives: Anti-infectives (From admission, onward)    Start     Dose/Rate Route Frequency Ordered Stop   11/27/22 1730  fluconazole (DIFLUCAN) tablet 200 mg        200 mg Oral Daily 11/27/22 1639     11/22/22 0600  cefoTEtan (CEFOTAN) 2 g in sodium chloride 0.9 % 100 mL IVPB        2 g 200 mL/hr over 30 Minutes Intravenous On call to O.R. 11/21/22 1427 11/22/22 1014   11/21/22 2200  neomycin (MYCIFRADIN) tablet 1,000 mg  Status:  Discontinued       See Hyperspace for full Linked Orders Report.   1,000 mg Oral 3 times per day 11/21/22 1427 11/21/22 1733   11/21/22 2200  metroNIDAZOLE (FLAGYL) tablet 1,000 mg  Status:  Discontinued       See Hyperspace for full Linked Orders Report.   1,000 mg Oral 3 times per day 11/21/22 1427 11/21/22 1733   11/21/22 2200  metroNIDAZOLE (FLAGYL) tablet 1,000 mg       See Hyperspace for full Linked Orders Report.   1,000 mg Oral  Once 11/21/22 1731 11/21/22 2120   11/21/22 2200  neomycin (MYCIFRADIN) tablet 1,000 mg       See Hyperspace for full Linked Orders Report.   1,000 mg Oral  Once 11/21/22 1731 11/21/22 2231   11/21/22 1900  metroNIDAZOLE (FLAGYL) tablet 1,000 mg       See Hyperspace for full Linked Orders Report.   1,000 mg Oral  Once 11/21/22 1731 11/21/22 1918   11/21/22 1900  neomycin (MYCIFRADIN) tablet 1,000 mg       See Hyperspace for full Linked Orders Report.   1,000 mg Oral  Once 11/21/22 1731 11/21/22 1918   11/21/22 1800  metroNIDAZOLE (FLAGYL) tablet 1,000 mg       See Hyperspace for full Linked Orders Report.   1,000 mg Oral  Once 11/21/22 1731 11/21/22 1753   11/21/22 1800  neomycin (MYCIFRADIN) tablet 1,000 mg       See Hyperspace for full Linked Orders Report.   1,000 mg Oral  Once 11/21/22 1731 11/21/22 1753    11/14/22 1545  cefTRIAXone (ROCEPHIN) 1 g in sodium chloride 0.9 % 100 mL IVPB        1 g 200 mL/hr over 30 Minutes Intravenous  Once 11/14/22 1539 11/15/22 0700       Assessment/Plan: Right colon mass, sigmoid mass  POD11 s/p laparoscopic right colectomy with ileo-colic anastomosis, laparoscopic sigmoid resection with end colostomy, skin biopsy 12/1 Dr. Kieth Brightly - c scope bx with adenocarcinoma. Surgical with synchronous T3N0 Tumors - discussed with patient.  Punch biopsy dermatitis. - CEA 51.3, CT CH without nodules, enlarged b/l axillary lymph nodes. Onc has been consulted and evaluated 12/7 - Hgb down but overall stable - tolerating regular diet. Like premier protein shakes and can bring these from home if wanted. mylanta for reflux. PRN Simethicone.  Home baclofen resumed - tolerating diet. Ostomy care - per chart [facility] RN plans teaching today 1130 - bright red blood noted on bed pad - discussed with RN and hospitalist and have been unable to identify source at bedside. CT with contrast yesterday without clear source. No blood in ostomy or foley. Noted plans for transvaginal u/s today - if negative she may be bleeding from rectal staple line and  would recommend following clinically - labs pending  FEN: regular ID: [email or web address] periop VTE: lovenox   LOS: 19 days   Winferd Humphrey, [facility] Surgery 12/03/2022, 9:13 AM Please see Amion for pager number during day hours 7:00am-4:30pm

## 2022-12-03 NOTE — Progress Notes (Signed)
Nutrition Follow-up  INTERVENTION:   -Prosource Plus PO BID, each provides 100 kcals and 15g protein  -Magic cup daily, each supplement provides 290 kcal and 9 grams of protein   -Mighty shake daily, provides 220 kcals and 9g protein  -Multivitamin with minerals daily  NUTRITION DIAGNOSIS:   Altered GI function related to constipation (concern for ileus) as evidenced by other (comment) (clear liquid diet).  Improved, now on regular diet.  GOAL:   Patient will meet greater than or equal to 90% of their needs  Progressing.  MONITOR:   Diet advancement, PO intake, supplement acceptance  ASSESSMENT:   51 y.o. female with medical history significant of paraplegia as a result of laminectomy, chronic anemia, morbid obesity, hypertension, constipation admitted for rash and symptomatic anemia  12/1 R colectomy and end colostomy; clear liquid diet 12/6 Advanced to Full liquid diet  Patient now on regular diet as of 12/10. Currently consuming 0-25% of meals at this time. Accepted Prosource today but inconsistently accepted other days.  Pt likes Premier Protein and was encouraged to bring in from home.   Admission weight: 360 lbs Current weight: 402 lbs Per nursing documentation, pt with mild generalized and BLE edema.   Medications: Colace, Folic acid, Megace, Multivitamin with minerals daily, Miralax  Labs reviewed.  Diet Order:   Diet Order             Diet regular Room service appropriate? Yes; Fluid consistency: Thin  Diet effective now                   EDUCATION NEEDS:   No education needs have been identified at this time  Skin:  Skin Assessment: Reviewed RN Assessment Skin Integrity Issues:: Other (Comment) Other: MSAD bilateral buttocks  Last BM:  12/12 -colostomy  Height:   Ht Readings from Last 1 Encounters:  11/22/22 '5\' 6"'$  (1.676 m)    Weight:   Wt Readings from Last 1 Encounters:  12/03/22 (!) 182.4 kg    Ideal Body Weight:  53.2 kg  (subtracted 10% for paraplegia)  BMI:  Body mass index is 64.9 kg/m.  Estimated Nutritional Needs:   Kcal:  2250-2400 kcal  Protein:  85-95 grams  Fluid:  </= 2.2L  Clayton Bibles, MS, RD, LDN Inpatient Clinical Dietitian Contact information available via Amion

## 2022-12-03 NOTE — Consult Note (Addendum)
[facility] Nurse ostomy follow up Sister at bedside for pouch change demonstration and teaching session.  Pt watched the process and asked appropriate questions. She was able to open and close the velcro independently to empty.  Provided her with a slanted urinal that she might be able to use at home to empty by propping against her body if no assistance is available.  Sister was able to stretch and apply barrier ring to wafer and snap wafer and pouch together, and open and close velcro to empty.   Stoma is red and viable, above skin level, 2 inches. 250cc liquid brown stool emptied.  Discussed pouching routines and ordering supplies.  Educational materials and 5 sets of supplies left at the bedside for use after discharge; use supplies: barrier ring, Kellie Simmering # [phone], wafer Kellie Simmering # 2, pouch Lawson # [phone] Enrolled patient in [employer] Discharge program: Yes, previously.   Sister states she has not received the package yet so I will request another be sent today.   Pt plans to have home health assistance after discharge.  Thank-you,  Julien Girt MSN, [place], [place], [place], [place]

## 2022-12-03 NOTE — Consult Note (Signed)
   OB/GYN Telephone Consult  Ellen Hill is a 51 y.o. [phone] presenting with a rash to the hospital but ultimately found to be anemic and diagnosed with colon cancer now s/p surgery. .   I was called for a consult regarding the care of this patient by [facility] - Dr. Sabino Gasser..    The provider had a clinical question regarding workup for bleeding of unknown origin. They are unsure if it is from the perineum or from the rectum due to body habitus. When Dr. Sabino Gasser ordered a pelvic US he was told they would not be able to do it if the patient cannot move her hips.   The provider presented the following relevant clinical information and I performed a chart review on the patient and reviewed available documentation:  It appears the patient has a history of fibroids. He reports her LMP was 2 years ago but unknown if this is oligomenorrhea or if menopause. He is not sure if she has had any recent gyn care. He notes some menstrual like cramping.   She did have a normal pap smear that was HPV negative 04/2017 by Dr. Ihor Dow. She had been placed on Megace for AUB during that time which helped her bleeding substantially. She had an TVUS at that time which showed multiple uterine fibroids but otherwise unremarkable [place].  She had an endometrial biopsy at that time which was negative.   Since that time she now has paraplegia from a back surgery. She has limited mobility and flexibility.    BP 120/65   Pulse 69   Temp 98.1 F (36.7 C) (Oral)   Resp 18   Ht '5\' 6"'$  (1.676 m)   Wt (!) 182.4 kg   SpO2 100%   BMI 64.90 kg/m   Exam- performed by consulting provider   Recommendations:  - Would advise attempting the TVUS. A TA [place] will be of little clinical benefit. If unable to perform [place] after attempting, would then recommend follow up with Dr. Valarie Cones for additional follow up or in our office at the [place].  - In the interim, would start Megace 80 BID to help with bleeding  if it is in fact vaginal then it would help typically within a week.    Thank you for this consult and if additional recommendations are needed please call [phone] for the OB/GYN attending on service at [facility].   I spent approximately 5 minutes directly consulting with the provider and verbally discussing this case. Additionally 10 minutes minutes was spent performing chart review and documentation.   Radene Gunning, MD Attending [place], [facility] for [facility], [place]

## 2022-12-04 ENCOUNTER — Encounter (HOSPITAL_COMMUNITY): Payer: Self-pay | Admitting: Internal Medicine

## 2022-12-04 ENCOUNTER — Inpatient Hospital Stay (HOSPITAL_COMMUNITY): Payer: Medicare HMO

## 2022-12-04 DIAGNOSIS — N939 Abnormal uterine and vaginal bleeding, unspecified: Secondary | ICD-10-CM

## 2022-12-04 DIAGNOSIS — G8220 Paraplegia, unspecified: Secondary | ICD-10-CM

## 2022-12-04 LAB — BASIC METABOLIC PANEL
Anion gap: 4 — ABNORMAL LOW (ref 5–15)
BUN: 14 mg/dL (ref 6–20)
CO2: 26 mmol/L (ref 22–32)
Calcium: 8.3 mg/dL — ABNORMAL LOW (ref 8.9–10.3)
Chloride: 109 mmol/L (ref 98–111)
Creatinine, Ser: 0.47 mg/dL (ref 0.44–1.00)
GFR, Estimated: >60 mL/min (ref >60)
Glucose, Bld: 105 mg/dL — ABNORMAL HIGH (ref 70–99)
Potassium: 3.7 mmol/L (ref 3.5–5.1)
Sodium: 139 mmol/L (ref 135–145)

## 2022-12-04 LAB — CBC WITH DIFFERENTIAL/PLATELET
Abs Immature Granulocytes: 0.07 10*3/uL (ref 0.00–0.07)
Basophils Absolute: 0 10*3/uL (ref 0.0–0.1)
Basophils Relative: 0 %
Eosinophils Absolute: 0.1 10*3/uL (ref 0.0–0.5)
Eosinophils Relative: 1 %
HCT: 26.5 % — ABNORMAL LOW (ref 36.0–46.0)
Hemoglobin: 7.5 g/dL — ABNORMAL LOW (ref 12.0–15.0)
Immature Granulocytes: 1 %
Lymphocytes Relative: 15 %
Lymphs Abs: 1.5 10*3/uL (ref 0.7–4.0)
MCH: 21.6 pg — ABNORMAL LOW (ref 26.0–34.0)
MCHC: 28.3 g/dL — ABNORMAL LOW (ref 30.0–36.0)
MCV: 76.4 fL — ABNORMAL LOW (ref 80.0–100.0)
Monocytes Absolute: 0.5 10*3/uL (ref 0.1–1.0)
Monocytes Relative: 5 %
Neutro Abs: 7.9 10*3/uL — ABNORMAL HIGH (ref 1.7–7.7)
Neutrophils Relative %: 78 %
Platelets: 449 10*3/uL — ABNORMAL HIGH (ref 150–400)
RBC: 3.47 MIL/uL — ABNORMAL LOW (ref 3.87–5.11)
WBC: 10 10*3/uL (ref 4.0–10.5)
nRBC: 0 % (ref 0.0–0.2)

## 2022-12-04 LAB — FOLLICLE STIMULATING HORMONE: FSH: 4.8 m[IU]/mL

## 2022-12-04 LAB — MAGNESIUM: Magnesium: 2.3 mg/dL (ref 1.7–2.4)

## 2022-12-04 MED ORDER — SODIUM CHLORIDE 0.9 % IV SOLN
250.0000 mg | Freq: Once | INTRAVENOUS | Status: AC
  Administered 2022-12-04: 250 mg via INTRAVENOUS
  Filled 2022-12-04: qty 20

## 2022-12-04 NOTE — Progress Notes (Signed)
12 Days Post-Op   Subjective/Chief Complaint: No acute changes. Still with some bleeding. Tolerating diet and good ostomy function  Objective: Vital signs in last 24 hours: Temp:  [97.8 F (36.6 C)-98.6 F (37 C)] 97.8 F (36.6 C) (12/13 0547) Pulse Rate:  [62-72] 62 (12/13 0547) Resp:  [18-20] 20 (12/13 0547) BP: (120-134)/(65-87) 120/71 (12/13 0547) SpO2:  [100 %] 100 % (12/13 0547) Weight:  [182.2 kg] 182.2 kg (12/13 0500) Last BM Date : 12/01/22  Intake/Output from previous day: 12/12 0701 - 12/13 0700 In: 1100 [P.O.:1080] Out: 2650 [Urine:2350; Stool:300] Intake/Output this shift: No intake/output data recorded.  Obese female - NAD Abd - soft, obese, well-healed incisions Ostomy viable - gas and liquid stool in bag. No blood.    Lab Results:  Recent Labs    12/03/22 1000 12/04/22 0905  WBC 11.9* 10.0  HGB 7.8* 7.5*  HCT 27.8* 26.5*  PLT 380 449*    BMET Recent Labs    12/03/22 0350 12/04/22 0029  NA 138 139  K 3.8 3.7  CL 109 109  CO2 24 26  GLUCOSE 70 105*  BUN 12 14  CREATININE 0.45 0.47  CALCIUM 7.9* 8.3*    PT/INR No results for input(s): "LABPROT", "INR" in the last 72 hours. ABG No results for input(s): "PHART", "HCO3" in the last 72 hours.  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 RADIATION DOSE REDUCTION: This exam was performed according to the departmental dose-optimization program which  includes automated exposure control, adjustment of the mA and/or kV according to patient size and/or use of iterative reconstruction technique. CONTRAST:  141m OMNIPAQUE IOHEXOL 300 MG/ML SOLN IV. No oral contrast. COMPARISON:  11/15/2022 FINDINGS: Lower chest: Subsegmental atelectasis BILATERAL lung bases Hepatobiliary: Post cholecystectomy.  Liver normal appearance Pancreas: Normal appearance Spleen: Normal appearance Adrenals/Urinary Tract: Adrenal glands normal appearance. 15 mm RIGHT renal cyst unchanged; no follow-up imaging recommended. Small nonobstructing calculi LEFT kidney. Small calculus at proximal LEFT ureter again identified, 6 mm diameter by 7 mm length unchanged. Mild associated LEFT hydronephrosis. Ureters decompressed. Bladder decompressed by Foley catheter. Stomach/Bowel: Prior RIGHT ileocolectomy with ileocolic anastomosis. Prior sigmoid resection with Hartmann's pouch and descending colostomy. Dilated small bowel loops question postoperative ileus, no discrete point of obstruction identified. No definite bowel wall thickening. Gaseous distention of stomach. Vascular/Lymphatic: Aorta normal caliber. Vascular structures patent. No adenopathy. Reproductive: Uterus normal appearance. Ovaries not well visualized. Other: Small amounts of free fluid in pelvis, interloop, adjacent to liver, and minimally in RIGHT gutter. Additional small amount of fluid posterior to the stomach question lesser sac. No focal abscess collection or extraluminal gas. Musculoskeletal: Prior spinal decompression of the lower thoracic spine. No acute osseous findings. IMPRESSION: Prior RIGHT ileocolectomy with ileocolic anastomosis. Prior sigmoid resection with Hartmann's pouch and descending colostomy. Dilated small bowel loops question postoperative ileus, no discrete point of obstruction identified. Small amounts of free intraperitoneal fluid without focal abscess collection or extraluminal gas. Small nonobstructing LEFT  renal calculi with a 6 x 7 mm calculus at the proximal LEFT ureter causing mild LEFT  hydronephrosis, unchanged. Bibasilar atelectasis. Electronically Signed   By: Lavonia Dana M.D.   On: 12/02/2022 13:06    Anti-infectives: Anti-infectives (From admission, onward)    Start     Dose/Rate Route Frequency Ordered Stop   11/27/22 1730  fluconazole (DIFLUCAN) tablet 200 mg        200 mg Oral Daily 11/27/22 1639     11/22/22 0600  cefoTEtan (CEFOTAN) 2 g in sodium chloride 0.9 % 100 mL IVPB        2 g 200 mL/hr over 30 Minutes Intravenous On call to O.R. 11/21/22 1427 11/22/22 1014   11/21/22 2200  neomycin (MYCIFRADIN) tablet 1,000 mg  Status:  Discontinued       See Hyperspace for full Linked Orders Report.   1,000 mg Oral 3 times per day 11/21/22 1427 11/21/22 1733   11/21/22 2200  metroNIDAZOLE (FLAGYL) tablet 1,000 mg  Status:  Discontinued       See Hyperspace for full Linked Orders Report.   1,000 mg Oral 3 times per day 11/21/22 1427 11/21/22 1733   11/21/22 2200  metroNIDAZOLE (FLAGYL) tablet 1,000 mg       See Hyperspace for full Linked Orders Report.   1,000 mg Oral  Once 11/21/22 1731 11/21/22 2120   11/21/22 2200  neomycin (MYCIFRADIN) tablet 1,000 mg       See Hyperspace for full Linked Orders Report.   1,000 mg Oral  Once 11/21/22 1731 11/21/22 2231   11/21/22 1900  metroNIDAZOLE (FLAGYL) tablet 1,000 mg       See Hyperspace for full Linked Orders Report.   1,000 mg Oral  Once 11/21/22 1731 11/21/22 1918   11/21/22 1900  neomycin (MYCIFRADIN) tablet 1,000 mg       See Hyperspace for full Linked Orders Report.   1,000 mg Oral  Once 11/21/22 1731 11/21/22 1918   11/21/22 1800  metroNIDAZOLE (FLAGYL) tablet 1,000 mg       See Hyperspace for full Linked Orders Report.   1,000 mg Oral  Once 11/21/22 1731 11/21/22 1753   11/21/22 1800  neomycin (MYCIFRADIN) tablet 1,000 mg       See Hyperspace for full Linked Orders Report.   1,000 mg Oral  Once 11/21/22 1731 11/21/22 1753    11/14/22 1545  cefTRIAXone (ROCEPHIN) 1 g in sodium chloride 0.9 % 100 mL IVPB        1 g 200 mL/hr over 30 Minutes Intravenous  Once 11/14/22 1539 11/15/22 0700       Assessment/Plan: Right colon mass, sigmoid mass  POD12 s/p laparoscopic right colectomy with ileo-colic anastomosis, laparoscopic sigmoid resection with end colostomy, skin biopsy 12/1 Dr. Kieth Brightly - c scope bx with adenocarcinoma. Surgical with synchronous T3N0 Tumors - discussed with patient.  Punch biopsy dermatitis. - CEA 51.3, CT CH without nodules, enlarged b/l axillary lymph nodes. Onc has been consulted and evaluated 12/7 - Hgb down but overall stable - 7.5 (7.8) - tolerating regular diet. Likes premier protein shakes and can bring these from home if wanted. mylanta for reflux. PRN Simethicone.  Home baclofen resumed - [facility] has completed ostomy teaching - bright red blood noted on bed pad - discussed with RN and hospitalist previously and have been unable to identify source at bedside. CT with contrast 12/11 without clear source. No blood in ostomy or foley. Radiology unable to complete transvaginal ultrasound - gyn consulted and recommend attempting and f/u in clinic. Started megace. - if bleeding is from rectal source would  recommend following clinically  FEN: regular ID: [email or web address] periop VTE: lovenox   LOS: 20 days   Ellen Hill, [facility] Surgery 12/04/2022, 9:19 AM Please see Amion for pager number during day hours 7:00am-4:30pm

## 2022-12-04 NOTE — Progress Notes (Signed)
PROGRESS NOTE   Ellen Hill [facility]  XLK:[id number] DOB: 13-Dec-1971 DOA: 11/14/2022 PCP: [facility]   Date of Service: the patient was seen and examined on 12/04/2022  Brief Narrative:  Ms. Gambill is a 51 yo female with PMH paraplegia 2/2 laminectomy, anemia, morbid obesity, HTN, constipation.  She originally presented to the hospital due to an ongoing and worsening rash that started at home approximately 1 month prior to admission.  The only associated new medicine was having started dicyclomine.  After approximately 3 days of the medication, she stopped it however states that the rash has continued to worsen and progress. She also endorsed trial of steroids with no improvement.  The rash is pruritic in nature.  On workup, she was also found to be anemic, hemoglobin 6.8 g/dL.  CT abdomen/pelvis was concerning for irregular thickening along the ascending colon and also a second site of thickening in the sigmoid colon. She underwent evaluation by GI and ultimately underwent colonoscopy on 11/19/2022.  Biopsies of ascending colon and sigmoid colon from colonoscopy returned positive with moderately differentiated adenocarcinoma and fragments of adenoma with high-grade dysplasia focally approaching intramucosal adenocarcinoma for the sigmoid colon biopsy. General surgery was consulted and she underwent laparoscopic right colectomy with ileocolic anastomosis and laparoscopic sigmoid resection with end colostomy.  Also underwent skin biopsy.   Assessment and Plan:  Hospital Problem List as of 12/04/2022          Priority Resolved POA     1.     * (Principal) Bullous rash 1.  Yes    Current Assessment & Plan 11/14/2022 Hospital Encounter Edited 12/04/2022  7:01 PM by Vernelle Emerald, MD     1 month history of bullous rash prompting hospitalization Skin biopsy obtained on 11/22/2022 showing mild superficial perivascular dermatitis with eosinophils Etiology not completely clear  although most likely etiology thought to be secondary to dcyclomine  Patient has been treated with Solu-Medrol systemically with triamcinolone cream topically. Patient would benefit from dermatology follow-up after discharge if transportation allows.        2.     Colon adenocarcinoma ([place]) 2.  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 Kieth Brightly on 12/1 Per oncology and review of pathology, both areas of colon cancer showed T3 lesion and negative margins and lymph nodes.  She had synchronized stage II colon cancer, well to moderately differentiated with no other high risk features.  Oncology has not recommended any adjuvant chemotherapy. While follow-up with oncology is ideal in the outpatient setting, patient reports that this will be difficult due to transportation issues.                             3.     Symptomatic anemia 3.  Yes    Current Assessment & Plan 11/14/2022 Hospital Encounter Edited 12/04/2022  7:03 PM by Vernelle Emerald, MD     Patient initially presented with hemoglobin of 6.8 on 11/23 Patient received 3 units of packed red blood cells transfused throughout the hospitalization. Majority of blood loss anemia thought to be secondary to recently resected adenocarcinoma of  the colon Now, it seems that patient is exhibiting some degree of vaginal bleeding Monitoring hemoglobin and hematocrit with serial CBCs Transfuse with additional packed red blood cells if hemoglobin drops less than 7 Will refer to outpatient gynecology for further evaluation and management        4.     Vaginal bleeding 4.  Yes    Current  Assessment & Plan 11/14/2022 Hospital Encounter Written 12/04/2022  7:06 PM by Vernelle Emerald, MD     Per my vaginal exam today I visually identified blood coming from the vagina and not the rectum Per discussions with gynecology and consultation by them on 12/12 patient is to be on Megace 80 mg twice daily Gynecology recommended transvaginal ultrasound unfortunately this has been unsuccessful due to patient's inability to appropriately manipulate her legs and allow the probe to enter the vaginal vault Therefore gynecology recommendations patient will be referred to gynecology in the outpatient setting for further evaluation and management        5.     Morbid obesity with body mass index (BMI) of 60.0 to 69.9 in adult ([place]) 5.  Not Applicable    Current Assessment & Plan 11/14/2022 Hospital Encounter Edited 12/04/2022  7:06 PM by Vernelle Emerald, MD     Counseling patient on caloric restriction  Body mass index is 65.58 kg/m.        6.     Hypokalemia 6.  Yes    Current Assessment & Plan 11/14/2022 Hospital Encounter Edited 12/04/2022  7:07 PM by Vernelle Emerald, MD     Replaced        7.     Hypophosphatemia 7.  Yes    Current Assessment & Plan 11/14/2022 Hospital Encounter Edited 12/04/2022  7:07 PM by Vernelle Emerald, MD     Replaced        8.     Paraplegia ([place]) 8.  Yes    Current Assessment & Plan 11/14/2022 Hospital Encounter Written 11/27/2022  4:22 PM by Dwyane Dee, MD     - Due to history of laminectomy - Bedbound - Home health aide for care at home        9.     Essential hypertension 9.  Yes    Current Assessment & Plan 11/14/2022 Hospital Encounter Edited 12/04/2022  7:08 PM by Vernelle Emerald, MD     Conservative management       Subjective:  Patient complains of intermittent ongoing mild vaginal bleeding.  Patient denies any associated abdominal pain.  Patient denies any shortness of breath chest pain or lightheadedness.  Physical  Exam:  Vitals:   12/03/22 2108 12/04/22 0500 12/04/22 0547 12/04/22 1242  BP: 121/73  120/71 112/62  Pulse: 64  62 73  Resp: '20  20 18  '$ Temp: 98.6 F (37 C)  97.8 F (36.6 C) 98.2 F (36.8 C)  TempSrc: Oral  Oral Oral  SpO2: 100%  100% 100%  Weight:  (!) 182.2 kg    Height:        Constitutional: Awake alert and oriented x3, no associated distress.  Patient is obese Skin: no rashes, no lesions, good skin turgor noted. Eyes: Pupils are equally reactive to light.  No evidence of scleral icterus or conjunctival pallor.  ENMT: Moist mucous membranes noted.  Posterior pharynx clear of any exudate or lesions.   Respiratory: clear to auscultation bilaterally, no wheezing, no crackles. Normal respiratory effort. No accessory muscle use.  Cardiovascular:  Regular rate and rhythm, no murmurs / rubs / gallops. No extremity edema. 2+ pedal pulses. No carotid bruits.  Abdomen: Ostomy in place exhibiting brown stool.  Mild lower abdominal tenderness.  Abdomen is soft.  No evidence of intra-abdominal masses.  Positive bowel sounds noted in all quadrants.   GU: Vaginal exam demonstrating bright red gross blood in the vaginal vault without clots.  Foley catheter is in place. Musculoskeletal: No joint deformity upper and lower extremities. Good ROM, no contractures. Normal muscle tone.    Data Reviewed:  I have personally reviewed and interpreted labs, imaging.  Significant findings are   CBC: Recent Labs  Lab 11/29/22 0335 11/30/22 0337 12/01/22 0048 12/03/22 1000 12/04/22 0905  WBC 9.3 9.1 7.9 11.9* 10.0  NEUTROABS 8.1* 6.9 5.7 9.0* 7.9*  HGB 7.6* 7.3* 7.4* 7.8* 7.5*  HCT 26.8* 25.1* 25.4* 27.8* 26.5*  MCV 75.3* 74.3* 75.6* 75.7* 76.4*  PLT 281 341 358 380 060*   Basic Metabolic Panel: Recent Labs  Lab 11/29/22 0335 11/30/22 0337 12/01/22 0048 12/03/22 0350 12/04/22 0029  NA 137 140 141 138 139  K 4.0 3.7 3.8 3.8 3.7  CL 107 108 110 109 109  CO2 '22 25 23 24 26  '$ GLUCOSE  110* 90 76 70 105*  BUN '7 8 10 12 14  '$ CREATININE 0.53 0.51 0.53 0.45 0.47  CALCIUM 7.6* 8.1* 8.2* 7.9* 8.3*  MG 2.2 2.2 2.3 2.2 2.3      Code Status:  Full code.  Code status decision has been confirmed with: patient    Severity of Illness:  The appropriate patient status for this patient is INPATIENT. Inpatient status is judged to be reasonable and necessary in order to provide the required intensity of service to ensure the patient's safety. The patient's presenting symptoms, physical exam findings, and initial radiographic and laboratory data in the context of their chronic comorbidities is felt to place them at high risk for further clinical deterioration. Furthermore, it is not anticipated that the patient will be medically stable for discharge from the hospital within 2 midnights of admission.   * I certify that at the point of admission it is my clinical judgment that the patient will require inpatient hospital care spanning beyond 2 midnights from the point of admission due to high intensity of service, high risk for further deterioration and high frequency of surveillance required.*  Time spent:  55 minutes  Author:  Vernelle Emerald MD  12/04/2022 7:08 PM

## 2022-12-04 NOTE — Assessment & Plan Note (Signed)
Per my vaginal exam today I visually identified blood coming from the vagina and not the rectum Per discussions with gynecology and consultation by them on 12/12 patient is to be on Megace 80 mg twice daily Gynecology recommended transvaginal ultrasound unfortunately this has been unsuccessful due to patient's inability to appropriately manipulate her legs and allow the probe to enter the vaginal vault Therefore gynecology recommendations patient will be referred to gynecology in the outpatient setting for further evaluation and management

## 2022-12-04 NOTE — Progress Notes (Signed)
Physical Therapy Treatment Patient Details Name: Ellen Hill MRN: [id number] DOB: 1971-03-17 Today's Date: 12/04/2022   History of Present Illness Patient is a 51 y.o. female admitted 11/14/22 for rash and symptomatic anemia.  Past medical history significant of paraplegia as a result of laminectomy, chronic anemia, morbid obesity, hypertension, constipation, neurogenic bladder, UTI.  Pt s/p laparoscopic right colectomy with ileo-colic anastomosis, laparoscopic sigmoid resection with end colostomy, skin biopsy on 11/22/22.    PT Comments    Patient is eager to  participate in sitting in bed/chair position. Patient performed resistive UE exercises.  And active LE.  Patient sat upright in bed for 3 minute intervals while supporting with UE's on bed rails and not supported at the back. Continue mobility   Recommendations for follow up therapy are one component of a multi-disciplinary discharge planning process, led by the attending physician.  Recommendations may be updated based on patient status, additional functional criteria and insurance authorization.  Follow Up Recommendations  Home health PT     Assistance Recommended at Discharge Intermittent Supervision/Assistance  Patient can return home with the following A lot of help with walking and/or transfers;A lot of help with bathing/dressing/bathroom;Assistance with cooking/housework   Equipment Recommendations  None recommended by PT    Recommendations for Other Services       Precautions / Restrictions Precautions Precautions: Fall Precaution Comments: R UQ colostomy Restrictions Weight Bearing Restrictions: No     Mobility  Bed Mobility               General bed mobility comments: placed bed in chair position with legs down, HOB at 60*, patient worked on  pulling forward to upright position x 3, holdng for 2-3 minutes. left in semi chair position    Transfers                         Ambulation/Gait                   Stairs             Wheelchair Mobility    Modified Rankin (Stroke Patients Only)       Balance Overall balance assessment: Needs assistance Sitting-balance support: Bilateral upper extremity supported   Sitting balance - Comments: in chair position, sits upright  holding with UE's                                    Cognition Arousal/Alertness: Awake/alert Behavior During Therapy: [facility] for tasks assessed/performed                                            Exercises General Exercises - Upper Extremity Shoulder Flexion: Both, 15 reps, Theraband Theraband Level (Shoulder Flexion): Level 4 (Blue) Shoulder Extension: Strengthening, 15 reps, Theraband Theraband Level (Shoulder Extension): Level 4 (Blue) Shoulder ADduction: Strengthening, 15 reps, Theraband, Both Theraband Level (Shoulder Adduction): Level 4 (Blue) Elbow Flexion: Strengthening, 10 reps, Theraband, Both Theraband Level (Elbow Flexion): Level 4 (Blue) Elbow Extension: Strengthening, Both, Theraband Theraband Level (Elbow Extension): Level 4 (Blue) General Exercises - Lower Extremity Ankle Circles/Pumps: AROM, Both, 20 reps Long Arc Quad: AROM, Both, 15 reps    General Comments        Pertinent Vitals/Pain Pain Assessment Pain Assessment: No/denies  pain    Home Living                          Prior Function            PT Goals (current goals can now be found in the care plan section) Progress towards PT goals: Progressing toward goals    Frequency    Min 2X/week      PT Plan Current plan remains appropriate    Co-evaluation              AM-PAC PT "6 Clicks" Mobility   Outcome Measure  Help needed turning from your back to your side while in a flat bed without using bedrails?: A Lot Help needed moving from lying on your back to sitting on the side of a flat bed without using bedrails?: A  Lot Help needed moving to and from a bed to a chair (including a wheelchair)?: Total Help needed standing up from a chair using your arms (e.g., wheelchair or bedside chair)?: Total Help needed to walk in hospital room?: Total Help needed climbing 3-5 steps with a railing? : Total 6 Click Score: 8    End of Session   Activity Tolerance: Patient tolerated treatment well Patient left: with call bell/phone within reach;in bed Nurse Communication: Mobility status;Need for lift equipment PT Visit Diagnosis: Muscle weakness (generalized) (M62.81);Other abnormalities of gait and mobility (R26.89)     Time: 1122-1205 PT Time Calculation (min) (ACUTE ONLY): 43 min  Charges:  $Therapeutic Exercise: 23-37 mins $Therapeutic Activity: 8-22 mins                     [place] Office [phone] Weekend [phone]    Claretha Cooper 12/04/2022, 2:35 PM

## 2022-12-05 LAB — CBC WITH DIFFERENTIAL/PLATELET
Abs Immature Granulocytes: 0.15 10*3/uL — ABNORMAL HIGH (ref 0.00–0.07)
Basophils Absolute: 0 10*3/uL (ref 0.0–0.1)
Basophils Relative: 0 %
Eosinophils Absolute: 0.4 10*3/uL (ref 0.0–0.5)
Eosinophils Relative: 3 %
HCT: 28.7 % — ABNORMAL LOW (ref 36.0–46.0)
Hemoglobin: 8.2 g/dL — ABNORMAL LOW (ref 12.0–15.0)
Immature Granulocytes: 1 %
Lymphocytes Relative: 19 %
Lymphs Abs: 2.3 10*3/uL (ref 0.7–4.0)
MCH: 21.6 pg — ABNORMAL LOW (ref 26.0–34.0)
MCHC: 28.6 g/dL — ABNORMAL LOW (ref 30.0–36.0)
MCV: 75.7 fL — ABNORMAL LOW (ref 80.0–100.0)
Monocytes Absolute: 0.7 10*3/uL (ref 0.1–1.0)
Monocytes Relative: 6 %
Neutro Abs: 8.6 10*3/uL — ABNORMAL HIGH (ref 1.7–7.7)
Neutrophils Relative %: 71 %
Platelets: 478 10*3/uL — ABNORMAL HIGH (ref 150–400)
RBC: 3.79 MIL/uL — ABNORMAL LOW (ref 3.87–5.11)
WBC: 12.2 10*3/uL — ABNORMAL HIGH (ref 4.0–10.5)
nRBC: 0.2 % (ref 0.0–0.2)

## 2022-12-05 LAB — BASIC METABOLIC PANEL
Anion gap: 5 (ref 5–15)
BUN: 12 mg/dL (ref 6–20)
CO2: 24 mmol/L (ref 22–32)
Calcium: 8.4 mg/dL — ABNORMAL LOW (ref 8.9–10.3)
Chloride: 110 mmol/L (ref 98–111)
Creatinine, Ser: 0.5 mg/dL (ref 0.44–1.00)
GFR, Estimated: >60 mL/min (ref >60)
Glucose, Bld: 95 mg/dL (ref 70–99)
Potassium: 3.5 mmol/L (ref 3.5–5.1)
Sodium: 139 mmol/L (ref 135–145)

## 2022-12-05 LAB — MAGNESIUM: Magnesium: 2.3 mg/dL (ref 1.7–2.4)

## 2022-12-05 MED ORDER — ALTEPLASE 2 MG IJ SOLR
2.0000 mg | Freq: Once | INTRAMUSCULAR | Status: AC
  Administered 2022-12-05: 2 mg
  Filled 2022-12-05: qty 2

## 2022-12-05 MED ORDER — METHYLPREDNISOLONE 16 MG PO TABS
32.0000 mg | ORAL_TABLET | Freq: Once | ORAL | Status: AC
  Administered 2022-12-06: 32 mg via ORAL
  Filled 2022-12-05: qty 2

## 2022-12-05 NOTE — Progress Notes (Signed)
Unable to arrange PTAR for pt departure tomorrow morning. Need MD dc order. Patient has voiced that she needs to be home before 2:30pm. TOC was notified of this need today.

## 2022-12-05 NOTE — Progress Notes (Signed)
13 Days Post-Op   Subjective/Chief Complaint: Pt reports feeling wiped out from last 24 hrs. She wants to get home but also just wants the day to rest. She is tolerating diet and having ostomy output. Bleeding identified as vaginal bleeding yesterday which pt reports a hx of.   Objective: Vital signs in last 24 hours: Temp:  [98.1 F (36.7 C)-99 F (37.2 C)] 98.1 F (36.7 C) (12/14 3710) Pulse Rate:  [64-76] 64 (12/14 0632) Resp:  [16-18] 16 (12/14 0632) BP: (112-138)/(62-75) 138/75 (12/14 0632) SpO2:  [99 %-100 %] 100 % (12/14 6269) Last BM Date : 12/01/22  Intake/Output from previous day: 12/13 0701 - 12/14 0700 In: 960 [P.O.:960] Out: 3560 [Urine:3550; Stool:10] Intake/Output this shift: No intake/output data recorded.  Obese female - NAD Abd - soft, obese, well-healed incisions Ostomy viable - gas and liquid stool in bag. No blood.    Lab Results:  Recent Labs    12/03/22 1000 12/04/22 0905  WBC 11.9* 10.0  HGB 7.8* 7.5*  HCT 27.8* 26.5*  PLT 380 449*    BMET Recent Labs    12/03/22 0350 12/04/22 0029  NA 138 139  K 3.8 3.7  CL 109 109  CO2 24 26  GLUCOSE 70 105*  BUN 12 14  CREATININE 0.45 0.47  CALCIUM 7.9* 8.3*    PT/INR No results for input(s): "LABPROT", "INR" in the last 72 hours. ABG No results for input(s): "PHART", "HCO3" in the last 72 hours.  Invalid input(s): "PCO2", "PO2"  Studies/Results: No results found.  Anti-infectives: Anti-infectives (From admission, onward)    Start     Dose/Rate Route Frequency Ordered Stop   11/27/22 1730  fluconazole (DIFLUCAN) tablet 200 mg        200 mg Oral Daily 11/27/22 1639     11/22/22 0600  cefoTEtan (CEFOTAN) 2 g in sodium chloride 0.9 % 100 mL IVPB        2 g 200 mL/hr over 30 Minutes Intravenous On call to O.R. 11/21/22 1427 11/22/22 1014   11/21/22 2200  neomycin (MYCIFRADIN) tablet 1,000 mg  Status:  Discontinued       See Hyperspace for full Linked Orders Report.   1,000 mg Oral 3  times per day 11/21/22 1427 11/21/22 1733   11/21/22 2200  metroNIDAZOLE (FLAGYL) tablet 1,000 mg  Status:  Discontinued       See Hyperspace for full Linked Orders Report.   1,000 mg Oral 3 times per day 11/21/22 1427 11/21/22 1733   11/21/22 2200  metroNIDAZOLE (FLAGYL) tablet 1,000 mg       See Hyperspace for full Linked Orders Report.   1,000 mg Oral  Once 11/21/22 1731 11/21/22 2120   11/21/22 2200  neomycin (MYCIFRADIN) tablet 1,000 mg       See Hyperspace for full Linked Orders Report.   1,000 mg Oral  Once 11/21/22 1731 11/21/22 2231   11/21/22 1900  metroNIDAZOLE (FLAGYL) tablet 1,000 mg       See Hyperspace for full Linked Orders Report.   1,000 mg Oral  Once 11/21/22 1731 11/21/22 1918   11/21/22 1900  neomycin (MYCIFRADIN) tablet 1,000 mg       See Hyperspace for full Linked Orders Report.   1,000 mg Oral  Once 11/21/22 1731 11/21/22 1918   11/21/22 1800  metroNIDAZOLE (FLAGYL) tablet 1,000 mg       See Hyperspace for full Linked Orders Report.   1,000 mg Oral  Once 11/21/22 1731 11/21/22 1753  11/21/22 1800  neomycin (MYCIFRADIN) tablet 1,000 mg       See Hyperspace for full Linked Orders Report.   1,000 mg Oral  Once 11/21/22 1731 11/21/22 1753   11/14/22 1545  cefTRIAXone (ROCEPHIN) 1 g in sodium chloride 0.9 % 100 mL IVPB        1 g 200 mL/hr over 30 Minutes Intravenous  Once 11/14/22 1539 11/15/22 0700       Assessment/Plan: Right colon mass, sigmoid mass  POD13 s/p laparoscopic right colectomy with ileo-colic anastomosis, laparoscopic sigmoid resection with end colostomy, skin biopsy 12/1 Dr. Kieth Brightly - c scope bx with adenocarcinoma. Surgical with synchronous T3N0 Tumors - discussed with patient.  Punch biopsy dermatitis. - CEA 51.3, CT CH without nodules, enlarged b/l axillary lymph nodes. Onc has been consulted and evaluated 12/7 - Hgb down but overall stable as of yesterday - 7.5 (7.8) - tolerating regular diet. Likes premier protein shakes and can bring  these from home if wanted. mylanta for reflux. PRN Simethicone.  Home baclofen resumed - [facility] has completed ostomy teaching - bleeding identified as vaginal yesterday, megace restarted and outpatient GYN follow up recommended - stable for discharge from a surgical perspective, follow up in AVS. Will follow peripherally at this point.   FEN: regular ID: [email or web address] periop VTE: lovenox   LOS: 21 days   Norm Parcel, [facility] 12/05/2022, 8:27 AM Please see Amion for pager number during day hours 7:00am-4:30pm

## 2022-12-05 NOTE — Progress Notes (Signed)
Occupational Therapy Treatment Patient Details Name: Ellen Hill MRN: [id number] DOB: 12-13-1971 Today's Date: 12/05/2022   History of present illness Patient is a 51 y.o. female admitted 11/14/22 for rash and symptomatic anemia.  Past medical history significant of paraplegia as a result of laminectomy, chronic anemia, morbid obesity, hypertension, constipation, neurogenic bladder, UTI.  Pt s/p laparoscopic right colectomy with ileo-colic anastomosis, laparoscopic sigmoid resection with end colostomy, skin biopsy on 11/22/22.   OT comments  Patient was motivated to participate in the session. She progressed to sitting EOB for ~10 minutes; while EOB, she performed grooming with SBA to min guard assist, then upper body dressing with mod assist. She required reliance on at least 1 upper extremity on the bed , in order to maintain sitting balance, except for on a few short periods. She required mod assist overall for bed mobility, including for rolling left & right and for sit to supine. She is making gradual functional progress and will continue to benefit from OT services to facilitate improved ADL performance & to decrease the risk for restricted participation in meaningful activities.    Recommendations for follow up therapy are one component of a multi-disciplinary discharge planning process, led by the attending physician.  Recommendations may be updated based on patient status, additional functional criteria and insurance authorization.    Follow Up Recommendations  Home health OT     Assistance Recommended at Discharge Frequent or constant Supervision/Assistance  Patient can return home with the following  Assistance with cooking/housework;A lot of help with bathing/dressing/bathroom   Equipment Recommendations  None recommended by OT       Precautions / Restrictions Precautions Precautions: Fall Precaution Comments: colostomy Restrictions Weight Bearing Restrictions: No        Mobility Bed Mobility Overal bed mobility: Needs Assistance Bed Mobility: Supine to Sit, Sit to Supine Rolling: Mod assist   Supine to sit: Mod assist, +2 for safety/equipment Sit to supine: Mod assist, +2 for safety/equipment   General bed mobility comments: Pt required increased time and general cues for sequencing/optimal transfer technique, including using bed rail as needed and trunk positioning    Transfers    General transfer comment: Pt sat EOB for ~10 minutes with SBA to occasional min guard assist, though she required increased reliance on at least 1 upper extremity to maintain sitting EOB, especially once fatigued     Balance       Sitting balance - Comments: SBA to min guard assist           ADL either performed or assessed with clinical judgement   ADL Overall ADL's : Needs assistance/impaired Eating/Feeding: Independent;Bed level   Grooming: Min guard Grooming Details (indicate cue type and reason): She performed face washing and teeth brushing seated EOB. She presented with increased reliance on one UE support on the bed to maintain sitting balance EOB.         Upper Body Dressing : Moderate assistance;Sitting Upper Body Dressing Details (indicate cue type and reason): She required assist to doff a hospital gown, then to don another one seated EOB. Lower Body Dressing: Total assistance;Bed level                        Cognition Arousal/Alertness: Awake/alert Behavior During Therapy: WFL for tasks assessed/performed Overall Cognitive Status: Within Functional Limits for tasks assessed        General Comments: Oriented x4, able to follow commands without difficulty  Pertinent Vitals/ Pain       Pain Assessment Pain Location: no pain reported         Frequency  Min 2X/week        Progress Toward Goals  OT Goals(current goals can now be found in the care plan section)  Progress towards OT goals:  Progressing toward goals  Acute Rehab OT Goals OT Goal Formulation: With patient Time For Goal Achievement: 12/13/22 Potential to Achieve Goals: [place] Discharge plan remains appropriate       AM-PAC OT "6 Clicks" Daily Activity     Outcome Measure   Help from another person eating meals?: None Help from another person taking care of personal grooming?: None Help from another person toileting, which includes using toliet, bedpan, or urinal?: Total Help from another person bathing (including washing, rinsing, drying)?: A Lot Help from another person to put on and taking off regular upper body clothing?: A Little Help from another person to put on and taking off regular lower body clothing?: Total 6 Click Score: 15    End of Session    OT Visit Diagnosis: Muscle weakness (generalized) (M62.81)   Activity Tolerance Patient tolerated treatment well   Patient Left in bed;with call bell/phone within reach   Nurse Communication Mobility status        Time: 2951-8841 OT Time Calculation (min): 27 min  Charges: OT General Charges $OT Visit: 1 Visit OT Treatments $Self Care/Home Management : 8-22 mins $Therapeutic Activity: 8-22 mins    Ellen Hill, OTR/L 12/05/2022, 10:20 AM

## 2022-12-05 NOTE — Consult Note (Addendum)
[place] Nurse ostomy follow up Pt performed pouch change with minimal amt assistance using a hand held mirror to visualize the stoma.  She was able to stretch and apply barrier ring to wafer and snap wafer and pouch together, and open and close velcro to empty.   Stoma is red and viable, above skin level, 2 inches. 80 cc liquid brown stool emptied.  Reviewed pouching routines and ordering supplies. Educational materials and 5 sets of supplies left at the bedside for use after discharge; use supplies: barrier ring, Kellie Simmering # [phone], wafer Kellie Simmering # 2, pouch Lawson # [phone].  Pt plans to have home health assistance after discharge.  Enrolled patient in [place] Discharge program: Yes, previously.   Gae Dry MSN, RN, [place], [place], [place]      Revision History

## 2022-12-05 NOTE — TOC Progression Note (Signed)
Transition of Care [facility]) - Progression Note    Patient Details  Name: Ellen Hill MRN: [id number] Date of Birth: 08-26-1971  Transition of Care [facility]) CM/SW Contact  Leeroy Cha, RN Phone Number: 12/05/2022, 8:33 AM  Clinical Narrative:    Hhc arranged through [facility] pt ot in placed rn for ostomy care added.   Expected Discharge Plan: [place] Barriers to Discharge: Continued Medical Work up  Expected Discharge Plan and Services Expected Discharge Plan: [place]   Discharge Planning Services: CM Consult Post Acute Care Choice: [place] arrangements for the past 2 months: Single Family Home                           HH Arranged: RN [place] Agency: [place] Date [place]: 12/05/22 Time [place]: [phone] Representative spoke with at [place]: [place] Determinants of Health ([place]) Interventions    Readmission Risk Interventions   No data to display

## 2022-12-05 NOTE — Progress Notes (Signed)
PROGRESS NOTE   Ellen Hill [facility]  KGU:[id number] DOB: 03-11-1971 DOA: 11/14/2022 PCP: [facility]   Date of Service: the patient was seen and examined on 12/05/2022  Brief Narrative:  [name] is a 51 yo female with PMH paraplegia 2/2 laminectomy, anemia, morbid obesity, HTN, constipation.  She originally presented to the hospital due to an ongoing and worsening rash that started at home approximately 1 month prior to admission.  The only associated new medicine was having started dicyclomine.  After approximately 3 days of the medication, she stopped it however states that the rash has continued to worsen and progress. She also endorsed trial of steroids with no improvement.  The rash is pruritic in nature.  On workup, she was also found to be anemic, hemoglobin 6.8 g/dL.  CT abdomen/pelvis was concerning for irregular thickening along the ascending colon and also a second site of thickening in the sigmoid colon. She underwent evaluation by GI and ultimately underwent colonoscopy on 11/19/2022.  Biopsies of ascending colon and sigmoid colon from colonoscopy returned positive with moderately differentiated adenocarcinoma and fragments of adenoma with high-grade dysplasia focally approaching intramucosal adenocarcinoma for the sigmoid colon biopsy. General surgery was consulted and she underwent laparoscopic right colectomy with ileocolic anastomosis and laparoscopic sigmoid resection with end colostomy.  Also underwent skin biopsy.   Assessment and Plan: * Bullous rash Improving Thought to be secondary to dcyclomine  Transitioning from intravenous Solu-Medrol to oral  Patient would benefit from dermatology follow-up after discharge if transportation allows.  Colon adenocarcinoma [facility]) Per oncology and review of pathology, both areas of colon cancer showed T3 lesion and negative margins and lymph nodes.  She had synchronized stage II colon cancer, well to moderately  differentiated with no other high risk features.  Oncology has not recommended any adjuvant chemotherapy. While follow-up with oncology is ideal in the outpatient setting, patient reports that this will be difficult due to transportation issues.   Ileus (HCC)-resolved as of 11/29/2022 - Ostomy now showing function - Appreciate surgery assistance - Continue diet as per surgery  Symptomatic anemia Hemoglobin remained stable Majority of blood loss anemia thought to be secondary to recently resected adenocarcinoma of the colon Now, it seems that patient is exhibiting some degree of vaginal bleeding Monitoring hemoglobin and hematocrit with serial CBCs Transfuse with additional packed red blood cells if hemoglobin drops less than 7 Will refer to outpatient gynecology for further evaluation and management  Vaginal bleeding Per my vaginal exam 12/13 I visually identified blood coming from the vagina and not the rectum Per discussions with gynecology and consultation by them on 12/12 patient is to be on Megace 80 mg twice daily Degree of bleeding seems to be improving. Per gynecology recommendations patient will be referred to gynecology in the outpatient setting for further evaluation and management  Morbid obesity with body mass index (BMI) of 60.0 to 69.9 in adult [facility]) Counseling patient on caloric restriction  Body mass index is 65.58 kg/m.  Hypokalemia Replaced  Hypophosphatemia Replaced  Paraplegia (HCC) - Due to history of laminectomy - Bedbound - Home health aide for care at home  Essential hypertension History of hypertension although blood pressures have been well-controlled  Leukocytosis - multifactorial likely from reactive nature and/or some demargination from steroid use; no overt signs of infection - continue trending CBC  Subjective:  Patient states that the amount of vaginal bleeding she is experiencing is decreasing.  Patient denies any associated pain  weakness shortness of breath or  chest pain.  Patient reports that her rash is continuing to improve.  Physical Exam:  Vitals:   12/04/22 2001 12/05/22 0632 12/05/22 1255 12/05/22 2015  BP: 129/67 138/75 137/77 (!) 120/59  Pulse: 76 64 74 91  Resp: '18 16 18 16  '$ Temp: 99 F (37.2 C) 98.1 F (36.7 C) 98.2 F (36.8 C) 99.4 F (37.4 C)  TempSrc: Oral Oral Oral Oral  SpO2: 99% 100% 100% 100%  Weight:      Height:         Constitutional: Awake alert and oriented x3, no associated distress.  Patient is obese. Skin: Involves areas of rash reveals resolving bulla with healing lesions.  Poor skin turgor otherwise. Eyes: Pupils are equally reactive to light.  No evidence of scleral icterus or conjunctival pallor.  ENMT: Moist mucous membranes noted.  Posterior pharynx clear of any exudate or lesions.   Respiratory: clear to auscultation bilaterally, no wheezing, no crackles. Normal respiratory effort. No accessory muscle use.  Cardiovascular: Regular rate and rhythm, no murmurs / rubs / gallops. No extremity edema. 2+ pedal pulses. No carotid bruits.  Abdomen: [employer] abdomen is soft and nontender.  No evidence of intra-abdominal masses.  Positive bowel sounds noted in all quadrants.   Musculoskeletal: No joint deformity upper and lower extremities. Good ROM, no contractures. Normal muscle tone.    Data Reviewed:  I have personally reviewed and interpreted labs, imaging.  Significant findings are   CBC: Recent Labs  Lab 11/30/22 0337 12/01/22 0048 12/03/22 1000 12/04/22 0905 12/05/22 1100  WBC 9.1 7.9 11.9* 10.0 12.2*  NEUTROABS 6.9 5.7 9.0* 7.9* 8.6*  HGB 7.3* 7.4* 7.8* 7.5* 8.2*  HCT 25.1* 25.4* 27.8* 26.5* 28.7*  MCV 74.3* 75.6* 75.7* 76.4* 75.7*  PLT 341 358 380 449* 607*   Basic Metabolic Panel: Recent Labs  Lab 11/30/22 0337 12/01/22 0048 12/03/22 0350 12/04/22 0029 12/05/22 1100  NA 140 141 138 139 139  K 3.7 3.8 3.8 3.7 3.5  CL 108 110 109 109 110  CO2  '25 23 24 26 24  '$ GLUCOSE 90 76 70 105* 95  BUN '8 10 12 14 12  '$ CREATININE 0.51 0.53 0.45 0.47 0.50  CALCIUM 8.1* 8.2* 7.9* 8.3* 8.4*  MG 2.2 2.3 2.2 2.3 2.3     Code Status:  Full code.  Code status decision has been confirmed with: patient Family Communication: deferred    Severity of Illness:  The appropriate patient status for this patient is INPATIENT. Inpatient status is judged to be reasonable and necessary in order to provide the required intensity of service to ensure the patient's safety. The patient's presenting symptoms, physical exam findings, and initial radiographic and laboratory data in the context of their chronic comorbidities is felt to place them at high risk for further clinical deterioration. Furthermore, it is not anticipated that the patient will be medically stable for discharge from the hospital within 2 midnights of admission.   * I certify that at the point of admission it is my clinical judgment that the patient will require inpatient hospital care spanning beyond 2 midnights from the point of admission due to high intensity of service, high risk for further deterioration and high frequency of surveillance required.*  Time spent:  50 minutes  Author:  Vernelle Emerald MD  12/05/2022 8:55 PM

## 2022-12-06 DIAGNOSIS — N3001 Acute cystitis with hematuria: Secondary | ICD-10-CM

## 2022-12-06 DIAGNOSIS — N3000 Acute cystitis without hematuria: Secondary | ICD-10-CM | POA: Diagnosis present

## 2022-12-06 LAB — CBC WITH DIFFERENTIAL/PLATELET
Abs Immature Granulocytes: 0.11 10*3/uL — ABNORMAL HIGH (ref 0.00–0.07)
Basophils Absolute: 0 10*3/uL (ref 0.0–0.1)
Basophils Relative: 0 %
Eosinophils Absolute: 1.1 10*3/uL — ABNORMAL HIGH (ref 0.0–0.5)
Eosinophils Relative: 9 %
HCT: 27.7 % — ABNORMAL LOW (ref 36.0–46.0)
Hemoglobin: 7.9 g/dL — ABNORMAL LOW (ref 12.0–15.0)
Immature Granulocytes: 1 %
Lymphocytes Relative: 20 %
Lymphs Abs: 2.4 10*3/uL (ref 0.7–4.0)
MCH: 22 pg — ABNORMAL LOW (ref 26.0–34.0)
MCHC: 28.5 g/dL — ABNORMAL LOW (ref 30.0–36.0)
MCV: 77.2 fL — ABNORMAL LOW (ref 80.0–100.0)
Monocytes Absolute: 0.6 10*3/uL (ref 0.1–1.0)
Monocytes Relative: 5 %
Neutro Abs: 7.6 10*3/uL (ref 1.7–7.7)
Neutrophils Relative %: 65 %
Platelets: 409 10*3/uL — ABNORMAL HIGH (ref 150–400)
RBC: 3.59 MIL/uL — ABNORMAL LOW (ref 3.87–5.11)
WBC: 11.8 10*3/uL — ABNORMAL HIGH (ref 4.0–10.5)
nRBC: 0.2 % (ref 0.0–0.2)

## 2022-12-06 LAB — COMPREHENSIVE METABOLIC PANEL
ALT: 33 U/L (ref 0–44)
AST: 24 U/L (ref 15–41)
Albumin: 2.7 g/dL — ABNORMAL LOW (ref 3.5–5.0)
Alkaline Phosphatase: 60 U/L (ref 38–126)
Anion gap: 7 (ref 5–15)
BUN: 13 mg/dL (ref 6–20)
CO2: 23 mmol/L (ref 22–32)
Calcium: 8 mg/dL — ABNORMAL LOW (ref 8.9–10.3)
Chloride: 114 mmol/L — ABNORMAL HIGH (ref 98–111)
Creatinine, Ser: 0.51 mg/dL (ref 0.44–1.00)
GFR, Estimated: >60 mL/min (ref >60)
Glucose, Bld: 87 mg/dL (ref 70–99)
Potassium: 3.7 mmol/L (ref 3.5–5.1)
Sodium: 144 mmol/L (ref 135–145)
Total Bilirubin: 0.4 mg/dL (ref 0.3–1.2)
Total Protein: 5.7 g/dL — ABNORMAL LOW (ref 6.5–8.1)

## 2022-12-06 LAB — MAGNESIUM: Magnesium: 2 mg/dL (ref 1.7–2.4)

## 2022-12-06 MED ORDER — TRIAMCINOLONE ACETONIDE 0.1 % EX CREA: TOPICAL_CREAM | Freq: Two times a day (BID) | CUTANEOUS | 0 refills | Status: AC

## 2022-12-06 MED ORDER — MEGESTROL ACETATE 40 MG PO TABS: 80.0000 mg | ORAL_TABLET | Freq: Two times a day (BID) | ORAL | 0 refills | Status: AC

## 2022-12-06 MED ORDER — TRAZODONE HCL 50 MG PO TABS: 50.0000 mg | ORAL_TABLET | Freq: Every evening | ORAL | 0 refills | Status: AC | PRN

## 2022-12-06 MED ORDER — NITROFURANTOIN MACROCRYSTAL 100 MG PO CAPS: 100.0000 mg | ORAL_CAPSULE | Freq: Four times a day (QID) | ORAL | 0 refills | Status: AC

## 2022-12-06 MED ORDER — GABAPENTIN 600 MG PO TABS: 600.0000 mg | ORAL_TABLET | Freq: Three times a day (TID) | ORAL | 1 refills | Status: AC

## 2022-12-06 MED ORDER — PANTOPRAZOLE SODIUM 40 MG PO TBEC: 40.0000 mg | DELAYED_RELEASE_TABLET | Freq: Every day | ORAL | 2 refills | Status: AC

## 2022-12-06 MED ORDER — NYSTATIN 100000 UNIT/GM EX POWD: 1.0000 | Freq: Two times a day (BID) | CUTANEOUS | 0 refills | Status: AC

## 2022-12-06 MED ORDER — HYDROXYZINE HCL 25 MG PO TABS: 25.0000 mg | ORAL_TABLET | Freq: Two times a day (BID) | ORAL | 0 refills | Status: AC | PRN

## 2022-12-06 MED ORDER — METHYLPREDNISOLONE 4 MG PO TABS: ORAL_TABLET | ORAL | 0 refills | Status: AC

## 2022-12-06 NOTE — Discharge Summary (Addendum)
Physician Discharge Summary   Patient: Ellen Hill MRN: [id number] DOB: 1971-09-25  Admit date:     11/14/2022  Discharge date: 12/06/22  Discharge Physician: Vernelle Emerald   PCP: [facility]   Recommendations at discharge:   Patient has been instructed to take the remaining tapering regimen of her oral steroids to completion to ensure resolution of her rash. Patient is additionally been instructed to take her medications as instructed for her vaginal bleeding and to follow-up closely as an outpatient with Dr. Berline Lopes with gynecology upon first available appointment. Patient has been instructed to follow-up as an outpatient with Dr. Annamaria Boots with oncology concerning her new diagnosis of adenocarcinoma of the colon. Patient has also been instructed to take her prescription for a 5-day course of nitrofurantoin twice daily for suspected cystitis.  Discharge Diagnoses: Principal Problem:   Bullous rash Active Problems:   Colon adenocarcinoma (HCC)   Symptomatic anemia   Vaginal bleeding   Morbid obesity with body mass index (BMI) of 60.0 to 69.9 in adult (HCC)   Hypokalemia   Hypophosphatemia   Paraplegia (HCC)   Essential hypertension   Cystitis, acute   Leukocytosis    Hospital Course: [name] is a 51 yo female with PMH paraplegia 2/2 laminectomy, anemia, morbid obesity, HTN, constipation.  She originally presented to the hospital due to an ongoing and worsening rash that started at home approximately 1 month prior to admission.    Upon evaluation in the emergency department hospitalist group was called to assess the patient for admission to the hospital for evaluation and treatment of her dicyclomine related rash.    In the days that followed, patient was managed with a combination of both systemic steroids and topical steroids with slow gradual improvement in her rash.  Skin biopsy performed during this hospitalization by Dr. Kieth Brightly on 12/1 revealed  mild superficial perivascular dermatitis with eosinophils seemingly supportive of a drug rash.  Also during the hospitalization patient was noted to be anemic with hemoglobin of 6.8.  CT imaging of the abdomen and pelvis revealed irregular thickening along the ascending colon with a second site of thickening of the sigmoid colon.  Patient underwent evaluation by [facility] gastroenterology with colonoscopy on 11/28 with biopsies consistent with moderately differentiated adenocarcinoma.  Dr. Kieth Brightly with general surgery was consulted and patient underwent laparoscopic right colectomy with ilio colonic anastomosis and laparoscopic sigmoid resection with end colostomy.  Dr. Annamaria Boots with oncology was consulted and upon review of the surgical pathology findings determined that both areas of colon cancer showed negative margins and lymph nodes.  No adjuvant chemotherapy was recommended.  Estimated recurrence is about 2025% in the next 5 years.  While outpatient follow-up with oncology was recommended, patient stated that outpatient follow-up would be difficult due to her body habitus and difficulty with getting to appointments.  In the days leading up to discharge, the patient began to exhibit some degree of gross vaginal bleeding.  Telemedicine consultation with Dr. Damita Dunnings with OB/GYN was obtained and Megace was recommended to be initiated.  Attempts were made to obtain a transvaginal ultrasound but were unsuccessful due to patient's body habitus.  OB/GYN recommended outpatient follow-up with Dr. Berline Lopes for further evaluation and treatment.  Arranges were made for the patient to eventually be discharged home.  Arrangements were made for home health services and home-going transportation.  Patient was discharged home in improved and stable condition 12/15.     Consultants: Dr. Kieth Brightly with Surgery, Dr. Alessandra Bevels with  gastroenterology, Dr. Burr Medico with Oncology Procedures performed:   Colonoscopy performed by Dr.  Randel Pigg on 11/28 Laparoscopic right colectomy with ileocolonic anastomosis and laparoscopic sigmoid resection with end colostomy by Dr. Kieth Brightly on 12/1 Skin biopsy on 12/1 by Dr. Kieth Brightly.  Disposition: Home health Diet recommendation:  Discharge Diet Orders (From admission, onward)     Start     Ordered   12/06/22 0000  Diet - low sodium heart healthy        12/06/22 1040           Cardiac diet  DISCHARGE MEDICATION: Allergies as of 12/06/2022       Reactions   Bentyl [dicyclomine] Rash        Medication List     STOP taking these medications    diphenhydrAMINE-zinc acetate cream Commonly known as: BENADRYL   hydrochlorothiazide 25 MG tablet Commonly known as: HYDRODIURIL   hydrocortisone cream 1 %   omeprazole 20 MG capsule Commonly known as: PRILOSEC   Potassium Chloride ER 20 MEQ Tbcr   tamsulosin 0.4 MG Caps capsule Commonly known as: FLOMAX       TAKE these medications    acetaminophen 500 MG tablet Commonly known as: TYLENOL Take 2 tablets (1,000 mg total) by mouth every 6 (six) hours.   baclofen 20 MG tablet Commonly known as: LIORESAL TAKE 1 TABLET EVERY DAY AT 6 PM What changed: See the new instructions.   baclofen 10 MG tablet Commonly known as: LIORESAL TAKE 1 TABLET TWICE A DAY AT 8 AM AND 1 PM WITH 5 MG TABLET. What changed: See the new instructions.   cetirizine 10 MG tablet Commonly known as: ZYRTEC Take 10 mg by mouth daily.   diphenhydrAMINE 25 mg capsule Commonly known as: BENADRYL Take 25 mg by mouth every 6 (six) hours as needed for itching or allergies.   gabapentin 600 MG tablet Commonly known as: NEURONTIN Take 1 tablet (600 mg total) by mouth 3 (three) times daily.   hydrOXYzine 25 MG tablet Commonly known as: ATARAX Take 1 tablet (25 mg total) by mouth 2 (two) times daily as needed for itching.   megestrol 40 MG tablet Commonly known as: MEGACE Take 2 tablets (80 mg total) by mouth 2 (two) times  daily.   methylPREDNISolone 4 MG tablet Commonly known as: Medrol Take 8 tablets (32 mg total) by mouth daily for 5 days, THEN 4 tablets (16 mg total) daily for 5 days, THEN 2 tablets (8 mg total) daily for 5 days, THEN 1 tablet (4 mg total) daily for 5 days. Start taking on: December 06, 2022   nitrofurantoin 100 MG capsule Commonly known as: MACRODANTIN Take 1 capsule (100 mg total) by mouth 4 (four) times daily for 5 days.   nystatin powder Commonly known as: MYCOSTATIN/NYSTOP Apply 1 Application topically 2 times daily at 12 noon and 4 pm.   oxyCODONE-acetaminophen 5-325 MG tablet Commonly known as: PERCOCET/ROXICET Take 1 tablet by mouth every 8 (eight) hours as needed for severe pain.   pantoprazole 40 MG tablet Commonly known as: Protonix Take 1 tablet (40 mg total) by mouth daily.   traZODone 50 MG tablet Commonly known as: DESYREL Take 1 tablet (50 mg total) by mouth at bedtime as needed for sleep.   triamcinolone cream 0.1 % Commonly known as: KENALOG Apply topically 2 (two) times daily for 14 days.        Follow-up Information     Kinsinger, Arta Bruce, MD. Go on 12/26/2022.  Specialty: General Surgery Why: 1:40 PM. Please arrive 30 min prior to appointment time. Contact information: [address] [place] [address] [place]. Schedule an appointment as soon as possible for a visit in 1 week(s).   Contact information: [place] [address] [phone]         Truitt Merle, MD. Schedule an appointment as soon as possible for a visit in 4 week(s).   Specialties: Hematology, Oncology Contact information: [place] [address] [phone]         Lafonda Mosses, MD. Schedule an appointment as soon as possible for a visit.   Specialty: Gynecologic Oncology Why: first available Contact information: [place]  [address] [phone]                 Discharge Exam: Danley Danker Weights   12/02/22 9983 12/03/22 0500 12/04/22 0500  Weight: (!) 184.7 kg (!) 182.4 kg (!) 182.2 kg    Constitutional: Awake alert and oriented x3, no associated distress.   Respiratory: clear to auscultation bilaterally, no wheezing, no crackles. Normal respiratory effort. No accessory muscle use.  Cardiovascular: Regular rate and rhythm, no murmurs / rubs / gallops. No extremity edema. 2+ pedal pulses. No carotid bruits.  Abdomen: Colostomy in place with pink mucosa with brown stool output.  Abdomen is soft and nontender.  Musculoskeletal: No joint deformity upper and lower extremities. Good ROM, no contractures. Normal muscle tone.     Condition at discharge: fair  The results of significant diagnostics from this hospitalization (including imaging, microbiology, ancillary and laboratory) are listed below for reference.   Imaging Studies: CT ABDOMEN PELVIS W CONTRAST  Result Date: 12/02/2022 CLINICAL DATA:  No menses for 2 years, vaginal bleeding versus skin excoriation; post laparoscopic partial RIGHT colectomy 11/22/2022 for synchronous invasive well to moderately differentiated adenocarcinomas invading pericolic adipose tissue (pT3 lesions) of RIGHT colon and sigmoid colon EXAM: CT ABDOMEN AND PELVIS WITH CONTRAST TECHNIQUE: Multidetector CT imaging of the abdomen and pelvis was performed using the standard protocol following bolus administration of intravenous contrast. RADIATION DOSE REDUCTION: This exam was performed according to the departmental dose-optimization program which includes automated exposure control, adjustment of the mA and/or kV according to patient size and/or use of iterative reconstruction technique. CONTRAST:  154m OMNIPAQUE IOHEXOL 300 MG/ML SOLN IV. No oral contrast. COMPARISON:  11/15/2022 FINDINGS: Lower chest: Subsegmental atelectasis BILATERAL lung bases Hepatobiliary: Post cholecystectomy.   Liver normal appearance Pancreas: Normal appearance Spleen: Normal appearance Adrenals/Urinary Tract: Adrenal glands normal appearance. 15 mm RIGHT renal cyst unchanged; no follow-up imaging recommended. Small nonobstructing calculi LEFT kidney. Small calculus at proximal LEFT ureter again identified, 6 mm diameter by 7 mm length unchanged. Mild associated LEFT hydronephrosis. Ureters decompressed. Bladder decompressed by Foley catheter. Stomach/Bowel: Prior RIGHT ileocolectomy with ileocolic anastomosis. Prior sigmoid resection with Hartmann's pouch and descending colostomy. Dilated small bowel loops question postoperative ileus, no discrete point of obstruction identified. No definite bowel wall thickening. Gaseous distention of stomach. Vascular/Lymphatic: Aorta normal caliber. Vascular structures patent. No adenopathy. Reproductive: Uterus normal appearance. Ovaries not well visualized. Other: Small amounts of free fluid in pelvis, interloop, adjacent to liver, and minimally in RIGHT gutter. Additional small amount of fluid posterior to the stomach question lesser sac. No focal abscess collection or extraluminal gas. Musculoskeletal: Prior spinal decompression of the lower thoracic spine. No acute osseous findings. IMPRESSION: Prior  RIGHT ileocolectomy with ileocolic anastomosis. Prior sigmoid resection with Hartmann's pouch and descending colostomy. Dilated small bowel loops question postoperative ileus, no discrete point of obstruction identified. Small amounts of free intraperitoneal fluid without focal abscess collection or extraluminal gas. Small nonobstructing LEFT renal calculi with a 6 x 7 mm calculus at the proximal LEFT ureter causing mild LEFT hydronephrosis, unchanged. Bibasilar atelectasis. Electronically Signed   By: Lavonia Dana M.D.   On: 12/02/2022 13:06   CT CHEST WO CONTRAST  Result Date: 11/20/2022 CLINICAL DATA:  Colon cancer staging.  * Tracking Code: BO * EXAM: CT CHEST WITHOUT  CONTRAST TECHNIQUE: Multidetector CT imaging of the chest was performed following the standard protocol without IV contrast. RADIATION DOSE REDUCTION: This exam was performed according to the departmental dose-optimization program which includes automated exposure control, adjustment of the mA and/or kV according to patient size and/or use of iterative reconstruction technique. COMPARISON:  CT abdomen/pelvis 11/15/2022 FINDINGS: Cardiovascular: The heart is within normal limits in size. No pericardial effusion. The aorta is normal in caliber. No atherosclerotic calcifications. The right-sided PICC line is noted in good position. Mediastinum/Nodes: No mediastinal or hilar mass or lymphadenopathy. The esophagus is unremarkable. There are enlarged bilateral axillary lymph nodes which are indeterminate. No supraclavicular adenopathy. Lungs/Pleura: No acute pulmonary process. No worrisome pulmonary lesions or pulmonary nodules to suggest metastatic disease. Small calcified granuloma noted in the right lower lobe. No pleural effusions or pleural nodules. Upper Abdomen: No significant upper abdominal findings. No hepatic lesions are identified. Status post cholecystectomy. Musculoskeletal: Postoperative changes involving the thoracic spine with previous laminectomies and benign-appearing postoperative fluid collection in the subcutaneous fat. Abnormal appearance of the sternal manubrium. Has a somewhat permeative appearance. Could not exclude a neoplastic process. Recommend correlation any sternal pain or tenderness. MRI may be helpful for further evaluation. IMPRESSION: 1. No worrisome pulmonary lesions or pulmonary nodules to suggest metastatic disease. 2. Enlarged bilateral axillary lymph nodes are indeterminate. 3. Abnormal appearance of the sternal manubrium. Could not exclude a permeative neoplastic process. Recommend correlation any sternal pain or tenderness. MRI may be helpful for further evaluation. 4.  Postoperative changes involving the thoracic spine with previous laminectomies and benign-appearing postoperative fluid collection in the subcutaneous fat. Electronically Signed   By: Marijo Sanes M.D.   On: 11/20/2022 09:29   DG CHEST PORT 1 VIEW  Result Date: 11/17/2022 CLINICAL DATA:  Leukocytosis EXAM: PORTABLE CHEST 1 VIEW COMPARISON:  11/14/2022 FINDINGS: The lungs are clear without focal pneumonia, edema, pneumothorax or pleural effusion. Cardiopericardial silhouette is at upper limits of normal for size. The visualized bony structures of the thorax are unremarkable. Right PICC line tip overlies the mid SVC level. Telemetry leads overlie the chest. IMPRESSION: No active disease. Electronically Signed   By: Misty Stanley M.D.   On: 11/17/2022 11:10   [place] EKG SITE RITE  Result Date: 11/15/2022 If Site Rite image not attached, placement could not be confirmed due to current cardiac rhythm.  CT ABDOMEN PELVIS W CONTRAST  Result Date: 11/15/2022 CLINICAL DATA:  Suspected bowel obstruction. Hemoccult positive stool. * Tracking Code: BO * EXAM: CT ABDOMEN AND PELVIS WITH CONTRAST TECHNIQUE: Multidetector CT imaging of the abdomen and pelvis was performed using the standard protocol following bolus administration of intravenous contrast. RADIATION DOSE REDUCTION: This exam was performed according to the departmental dose-optimization program which includes automated exposure control, adjustment of the mA and/or kV according to patient size and/or use of iterative reconstruction technique. CONTRAST:  155m OMNIPAQUE IOHEXOL  300 MG/ML  SOLN COMPARISON:  October 02, 2022. FINDINGS: Lower chest: Lung bases are clear without effusion or consolidative changes. Hepatobiliary: Limited assessment of the liver which is incompletely imaged. No gross lesion. No biliary duct dilation following cholecystectomy. Study overall is limited by body habitus. Portal vein is patent. Pancreas: Normal, without mass,  inflammation or ductal dilatation. Spleen: Top normal size. Adrenals/Urinary Tract: Adrenal glands are normal. Nephrolithiasis on the LEFT with LEFT proximal ureteral calculi in the range of 4-5 mm, perhaps 2 adjacent calculi or 1 irregular calculus displaying mild LEFT proximal ureteral and renal pelvic distension without hydronephrosis is unchanged. Calculi within the kidney also unchanged. 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 No additional ureteral calculi on today's study. No perivesical stranding. Stomach/Bowel: Large volume of stool in the rectum. No substantial perirectal stranding is. Moderate stool elsewhere in the colon. Just proximal to the hepatic flexure in the ascending colon there is irregular wall thickening with infiltration of the pericolonic fat associated with small pericolonic lymph nodes. In hindsight a subtle finding on previous imaging and persistence and appearance is highly concerning for colorectal neoplasm. Additional site of wall thickening potentially in the sigmoid colon (image 57/3) likewise potentially present and persistent. Stomach without signs of inflammation. Small bowel without signs of obstruction. Lesion in the ascending colon does not appear to obstruct. Appendix is normal. Vascular/Lymphatic: Scattered small lymph nodes throughout the retroperitoneum. No upper abdominal lymphadenopathy no gross mesenteric adenopathy though scattered small lymph nodes throughout the RIGHT colonic mesentery suspicious given other findings on the current study. No pelvic lymphadenopathy. Vascular structures in the abdomen and pelvis are patent grossly on venous phase. Reproductive: Unremarkable. Other: No ascites.  No pneumoperitoneum. Musculoskeletal: Spinal degenerative changes. No acute or destructive bone process. Signs of body wall edema. IMPRESSION: 1. Circumferential irregular  thickening of the ascending colon just proximal to the hepatic flexure with adjacent small mesenteric lymph nodes is highly concerning for if not diagnostic of colonic neoplasm. 2. Second site of potential thickening in the sigmoid: Raise the question of an additional colorectal neoplasm of the mid sigmoid colon, less likely colitis. Endoscopic correlation is advised. 3. Signs of fecal impaction. Wall thickness of the rectum raising the question of stercoral proctocolitis. No substantial adjacent stranding at this time. 4. Proximal LEFT ureteral calculus/calculi with mild fullness of the LEFT ureter, stable appearance of intrarenal calculi on the LEFT since previous imaging. 5. Signs of body wall edema 6. Limited assessment of the liver which is incompletely imaged. These results will be called to the ordering clinician or representative by the Radiologist Assistant, and communication documented in the PACS or [employer]. Electronically Signed   By: Zetta Bills M.D.   On: 11/15/2022 08:26   DG Abd 1 View  Result Date: 11/14/2022 CLINICAL DATA:  Constipation, no bowel movement for 3 days. EXAM: ABDOMEN - 1 VIEW COMPARISON:  None Available. FINDINGS: A few distended loops of small bowel are noted in the abdomen measuring up to 3.5 cm. A moderate amount of retained stool is present in the colon and rectum. No radio-opaque calculi or other acute radiographic abnormality are seen. Surgical clips are present in the right upper quadrant. IMPRESSION: 1. Multiple distended loops of small bowel in the abdomen measuring up to 3.5 cm, possible ileus versus obstruction. 2. Moderate amount of retained stool in the colon. Electronically Signed   By: Regan Rakers.D.  On: 11/14/2022 20:41   DG Chest Portable 1 View  Result Date: 11/14/2022 CLINICAL DATA:  weakness EXAM: PORTABLE CHEST 1 VIEW COMPARISON:  Chest x-ray 05/20/2011 FINDINGS: The heart and mediastinal contours are within normal limits. Left  costophrenic angle and left base/hemidiaphragm collimated off view. No focal consolidation. No pulmonary edema. No pleural effusion. No pneumothorax. No acute osseous abnormality. IMPRESSION: No active disease; however, left costophrenic angle and left base/hemidiaphragm collimated off view. Cannot exclude consolidation at the left base. Recommend repeat PA and lateral view of the chest. Electronically Signed   By: Iven Finn M.D.   On: 11/14/2022 17:07    Microbiology: Results for orders placed or performed during the hospital encounter of 11/14/22  Blood culture (routine x 2)     Status: None   Collection Time: 11/14/22  5:30 PM   Specimen: BLOOD RIGHT FOREARM  Result Value Ref Range Status   Specimen Description   Final    BLOOD RIGHT FOREARM Performed at [facility] [address], [place], [place] [address]    Special Requests   Final    BOTTLES DRAWN AEROBIC AND ANAEROBIC Blood Culture adequate volume Performed at [facility] [address] [place], [place] [address]    Culture   Final    NO GROWTH 5 DAYS Performed at [facility], [place] [address] [place], [place] [address]    Report Status 11/19/2022 FINAL  Final  Blood culture (routine x 2)     Status: None   Collection Time: 11/14/22  7:39 PM   Specimen: BLOOD  Result Value Ref Range Status   Specimen Description   Final    BLOOD LEFT ANTECUBITAL Performed at [facility] [address], [place], [place] [address]    Special Requests   Final    BOTTLES DRAWN AEROBIC ONLY Blood Culture adequate volume Performed at [facility] [address] [place], [place] [address]    Culture   Final    NO GROWTH 5 DAYS Performed at [facility], [place] [address] [place], [place] [address]    Report Status 11/19/2022 FINAL  Final  Urine Culture     Status: Abnormal   Collection Time: 11/14/22 11:22 PM   Specimen: Urine, Catheterized  Result Value  Ref Range Status   Specimen Description   Final    URINE, CATHETERIZED Performed at [facility] [address], [place], [place] [address]    Special Requests   Final    NONE Performed at [facility], [place] [address] [place], [place] [address]    Culture 10,000 COLONIES/mL ENTEROCOCCUS FAECALIS (A)  Final   Report Status 11/18/2022 FINAL  Final   Organism ID, Bacteria ENTEROCOCCUS FAECALIS (A)  Final      Susceptibility   Enterococcus faecalis - MIC*    AMPICILLIN <=2 SENSITIVE Sensitive     NITROFURANTOIN <=16 SENSITIVE Sensitive     VANCOMYCIN 1 SENSITIVE Sensitive     * 10,000 COLONIES/mL ENTEROCOCCUS FAECALIS  Respiratory (~20 pathogens) panel by PCR     Status: None   Collection Time: 11/15/22 10:35 AM   Specimen: Nasopharyngeal Swab; Respiratory  Result Value Ref Range Status   Adenovirus NOT DETECTED NOT DETECTED Final   Coronavirus 229E NOT DETECTED NOT DETECTED Final    Comment: (NOTE) The Coronavirus on the Respiratory Panel, DOES NOT test for the novel  Coronavirus (2019 nCoV)    Coronavirus HKU1  NOT DETECTED NOT DETECTED Final   Coronavirus NL63 NOT DETECTED NOT DETECTED Final   Coronavirus OC43 NOT DETECTED NOT DETECTED Final   Metapneumovirus NOT DETECTED NOT DETECTED Final   Rhinovirus / Enterovirus NOT DETECTED NOT DETECTED Final   Influenza A NOT DETECTED NOT DETECTED Final   Influenza B NOT DETECTED NOT DETECTED Final   Parainfluenza Virus 1 NOT DETECTED NOT DETECTED Final   Parainfluenza Virus 2 NOT DETECTED NOT DETECTED Final   Parainfluenza Virus 3 NOT DETECTED NOT DETECTED Final   Parainfluenza Virus 4 NOT DETECTED NOT DETECTED Final   Respiratory Syncytial Virus NOT DETECTED NOT DETECTED Final   Bordetella pertussis NOT DETECTED NOT DETECTED Final   Bordetella Parapertussis NOT DETECTED NOT DETECTED Final   Chlamydophila pneumoniae NOT DETECTED NOT DETECTED Final   Mycoplasma pneumoniae NOT DETECTED NOT  DETECTED Final    Comment: Performed at [facility], [place] [address], [place], [place] [address]  Urine Culture     Status: None   Collection Time: 11/17/22  1:02 PM   Specimen: Urine, Catheterized  Result Value Ref Range Status   Specimen Description   Final    URINE, CATHETERIZED Performed at [facility] [address], [place], [place] [address]    Special Requests   Final    NONE Performed at [facility], [place] [address] [place], [place] [address]    Culture   Final    NO GROWTH Performed at [facility], [place] [address] [place], [place] [address]    Report Status 11/18/2022 FINAL  Final    Labs: CBC: Recent Labs  Lab 12/01/22 0048 12/03/22 1000 12/04/22 0905 12/05/22 1100 12/06/22 0357  WBC 7.9 11.9* 10.0 12.2* 11.8*  NEUTROABS 5.7 9.0* 7.9* 8.6* 7.6  HGB 7.4* 7.8* 7.5* 8.2* 7.9*  HCT 25.4* 27.8* 26.5* 28.7* 27.7*  MCV 75.6* 75.7* 76.4* 75.7* 77.2*  PLT 358 380 449* 478* 254*   Basic Metabolic Panel: Recent Labs  Lab 12/01/22 0048 12/03/22 0350 12/04/22 0029 12/05/22 1100 12/06/22 0357  NA 141 138 139 139 144  K 3.8 3.8 3.7 3.5 3.7  CL 110 109 109 110 114*  CO2 '23 24 26 24 23  '$ GLUCOSE 76 70 105* 95 87  BUN '10 12 14 12 13  '$ CREATININE 0.53 0.45 0.47 0.50 0.51  CALCIUM 8.2* 7.9* 8.3* 8.4* 8.0*  MG 2.3 2.2 2.3 2.3 2.0   Liver Function Tests: Recent Labs  Lab 12/06/22 0357  AST 24  ALT 33  ALKPHOS 60  BILITOT 0.4  PROT 5.7*  ALBUMIN 2.7*   CBG: No results for input(s): "GLUCAP" in the last 168 hours.  Discharge time spent: greater than 30 minutes.  Signed: Vernelle Emerald, MD [facility] 12/06/2022

## 2022-12-06 NOTE — TOC Transition Note (Addendum)
Transition of Care [facility]) - CM/SW Discharge Note   Patient Details  Name: Ellen Hill MRN: [id number] Date of Birth: 1971/11/16  [facility] CM/SW Contact:  Henrietta Dine, RN Phone Number: [phone] 12/06/2022, 9:31 AM   Clinical Narrative:    D/C orders received; HHPT/OT, and [facility] for ostomy care previously arranged through [place]; [place] at [facility] notified of pt d/c; spoke w/ pt and verified address; pt will be transported by [place]; [place] called at [phone]; spoke; Liliane Channel; no TOC needs.    Final next level of care: [place] Barriers to Discharge: Continued Medical Work up   Patient Goals and CMS Choice Patient states their goals for this hospitalization and ongoing recovery are::  (Home) CMS Medicare.gov Compare Post Acute Care list provided to:: Patient Choice offered to / list presented to : Patient  Discharge Placement                       Discharge Plan and Services   Discharge Planning Services: CM Consult Post Acute Care Choice: Home Health                    HH Arranged: RN [place]: [place] Date [place]: 12/05/22 Time [place]: [phone] Representative spoke with at [place]: [place] Determinants of Health ([place]) Interventions     Readmission Risk Interventions     No data to display

## 2022-12-06 NOTE — Plan of Care (Signed)
  Problem: Fluid Volume: Goal: Hemodynamic stability will improve Outcome: Progressing   Problem: Education: Goal: Knowledge of General Education information will improve Description: Including pain rating scale, medication(s)/side effects and non-pharmacologic comfort measures Outcome: Adequate for Discharge   Problem: Activity: Goal: Risk for activity intolerance will decrease Outcome: Adequate for Discharge   Problem: Skin Integrity: Goal: Risk for impaired skin integrity will decrease Outcome: Adequate for Discharge

## 2022-12-06 NOTE — Progress Notes (Signed)
RUE PICC removal: Site unremarkable. Vaseline gauze and pressure dressing applied. Pt instructed to leave dressing intact for 24 hours, monitor site for erythema drainage or pain. Pt to remain flat in bed for 30 min post removal. She voiced understanding.

## 2022-12-20 ENCOUNTER — Ambulatory Visit (HOSPITAL_COMMUNITY): Payer: Medicare HMO | Admitting: Nurse Practitioner

## 2023-02-20 NOTE — Telephone Encounter (Signed)
Error

## 2023-03-04 ENCOUNTER — Ambulatory Visit (HOSPITAL_COMMUNITY): Payer: Medicare HMO | Admitting: Nurse Practitioner

## 2023-05-13 ENCOUNTER — Other Ambulatory Visit: Payer: Self-pay

## 2023-05-13 ENCOUNTER — Emergency Department (HOSPITAL_COMMUNITY)
Admission: EM | Admit: 2023-05-13 | Discharge: 2023-05-13 | Disposition: A | Discharge status: Home / Self Care | Payer: Medicare HMO | Attending: Emergency Medicine | Admitting: Emergency Medicine

## 2023-05-13 ENCOUNTER — Encounter (HOSPITAL_COMMUNITY): Payer: Self-pay

## 2023-05-13 ENCOUNTER — Emergency Department (HOSPITAL_COMMUNITY): Payer: Medicare HMO

## 2023-05-13 DIAGNOSIS — I10 Essential (primary) hypertension: Secondary | ICD-10-CM | POA: Insufficient documentation

## 2023-05-13 DIAGNOSIS — R0602 Shortness of breath: Secondary | ICD-10-CM | POA: Diagnosis present

## 2023-05-13 DIAGNOSIS — R030 Elevated blood-pressure reading, without diagnosis of hypertension: Secondary | ICD-10-CM

## 2023-05-13 DIAGNOSIS — R062 Wheezing: Secondary | ICD-10-CM | POA: Diagnosis not present

## 2023-05-13 DIAGNOSIS — R059 Cough, unspecified: Secondary | ICD-10-CM | POA: Diagnosis not present

## 2023-05-13 DIAGNOSIS — R600 Localized edema: Secondary | ICD-10-CM | POA: Diagnosis not present

## 2023-05-13 DIAGNOSIS — E8809 Other disorders of plasma-protein metabolism, not elsewhere classified: Secondary | ICD-10-CM

## 2023-05-13 DIAGNOSIS — D509 Iron deficiency anemia, unspecified: Secondary | ICD-10-CM | POA: Diagnosis not present

## 2023-05-13 DIAGNOSIS — R770 Abnormality of albumin: Secondary | ICD-10-CM | POA: Insufficient documentation

## 2023-05-13 DIAGNOSIS — J45909 Unspecified asthma, uncomplicated: Secondary | ICD-10-CM | POA: Diagnosis not present

## 2023-05-13 LAB — CBC WITH DIFFERENTIAL/PLATELET
Abs Immature Granulocytes: 0.02 10*3/uL (ref 0.00–0.07)
Basophils Absolute: 0 10*3/uL (ref 0.0–0.1)
Basophils Relative: 0 %
Eosinophils Absolute: 0.3 10*3/uL (ref 0.0–0.5)
Eosinophils Relative: 4 %
HCT: 31.7 % — ABNORMAL LOW (ref 36.0–46.0)
Hemoglobin: 9.4 g/dL — ABNORMAL LOW (ref 12.0–15.0)
Immature Granulocytes: 0 %
Lymphocytes Relative: 21 %
Lymphs Abs: 1.6 10*3/uL (ref 0.7–4.0)
MCH: 23 pg — ABNORMAL LOW (ref 26.0–34.0)
MCHC: 29.7 g/dL — ABNORMAL LOW (ref 30.0–36.0)
MCV: 77.7 fL — ABNORMAL LOW (ref 80.0–100.0)
Monocytes Absolute: 0.4 10*3/uL (ref 0.1–1.0)
Monocytes Relative: 6 %
Neutro Abs: 5.4 10*3/uL (ref 1.7–7.7)
Neutrophils Relative %: 69 %
Platelets: 332 10*3/uL (ref 150–400)
RBC: 4.08 MIL/uL (ref 3.87–5.11)
RDW: 19.7 % — ABNORMAL HIGH (ref 11.5–15.5)
WBC: 7.8 10*3/uL (ref 4.0–10.5)
nRBC: 0 % (ref 0.0–0.2)

## 2023-05-13 LAB — COMPREHENSIVE METABOLIC PANEL
ALT: 12 U/L (ref 0–44)
AST: 17 U/L (ref 15–41)
Albumin: 2.6 g/dL — ABNORMAL LOW (ref 3.5–5.0)
Alkaline Phosphatase: 63 U/L (ref 38–126)
Anion gap: 8 (ref 5–15)
BUN: 7 mg/dL (ref 6–20)
CO2: 19 mmol/L — ABNORMAL LOW (ref 22–32)
Calcium: 8.4 mg/dL — ABNORMAL LOW (ref 8.9–10.3)
Chloride: 114 mmol/L — ABNORMAL HIGH (ref 98–111)
Creatinine, Ser: 0.77 mg/dL (ref 0.44–1.00)
GFR, Estimated: >60 mL/min (ref >60)
Glucose, Bld: 97 mg/dL (ref 70–99)
Potassium: 3.5 mmol/L (ref 3.5–5.1)
Sodium: 141 mmol/L (ref 135–145)
Total Bilirubin: 0.4 mg/dL (ref 0.3–1.2)
Total Protein: 7.2 g/dL (ref 6.5–8.1)

## 2023-05-13 LAB — BRAIN NATRIURETIC PEPTIDE: B Natriuretic Peptide: 36 pg/mL (ref 0.0–100.0)

## 2023-05-13 NOTE — ED Provider Notes (Signed)
[place] EMERGENCY DEPARTMENT AT [facility] Provider Note   CSN: [id number] Arrival date & time: 05/13/23  1204     History  No chief complaint on file.   Ellen Hill is a 52 y.o. female.  HPI Patient presents with shortness of breath cough.  States she has wheezes.  Thinks she could have extra fluid.  No history of CHF.  States she has more swelling on her legs.  Has a colostomy and states that she is actually had less output.  Patient is bedbound at baseline due to previous back surgery.  States she has had cough without sputum production feels little wheezy at times.  No chest pain.   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 after loss of parents, is fine   Vaginal Pap smear, abnormal    f/u wnl    Home Medications Prior to Admission medications   Medication Sig Start Date End Date Taking? Authorizing Provider  acetaminophen (TYLENOL) 500 MG tablet Take 2 tablets (1,000 mg total) by mouth every 6 (six) hours. 11/29/22   Eric Form, PA-C  baclofen (LIORESAL) 10 MG tablet TAKE 1 TABLET TWICE A DAY AT 8 AM AND 1 PM WITH 5 MG TABLET. Patient taking differently: Take 10 mg by mouth 2 (two) times daily. 01/23/22   Deeann Saint, MD  baclofen (LIORESAL) 20 MG tablet TAKE 1 TABLET EVERY DAY AT 6 PM Patient taking differently:  Take 20 mg by mouth at bedtime. 09/07/21   Deeann Saint, MD  cetirizine (ZYRTEC) 10 MG tablet Take 10 mg by mouth daily.    [provider]  diphenhydrAMINE (BENADRYL) 25 mg capsule Take 25 mg by mouth every 6 (six) hours as needed for itching or allergies.    [provider]  gabapentin (NEURONTIN) 600 MG tablet Take 1 tablet (600 mg total) by mouth 3 (three) times daily. 12/06/22   Shalhoub, Deno Lunger, MD  hydrOXYzine (ATARAX) 25 MG tablet Take 1 tablet (25 mg total) by mouth 2 (two) times daily as needed for itching. 12/06/22   Shalhoub, Deno Lunger, MD  megestrol (MEGACE) 40 MG tablet Take 2 tablets (80 mg total) by mouth 2 (two) times daily. 12/06/22   Shalhoub, Deno Lunger, MD  nystatin (MYCOSTATIN/NYSTOP) powder Apply 1 Application topically 2 times daily at 12 noon and 4 pm. 12/06/22   Shalhoub, Deno Lunger, MD  oxyCODONE-acetaminophen (PERCOCET/ROXICET) 5-325 MG tablet Take 1 tablet by mouth every 8 (eight) hours as needed for severe pain.    [provider]  pantoprazole (PROTONIX) 40 MG tablet Take 1 tablet (40 mg total) by mouth daily. 12/06/22   Shalhoub, Deno Lunger, MD  traZODone (  DESYREL) 50 MG tablet Take 1 tablet (50 mg total) by mouth at bedtime as needed for sleep. 12/06/22   Shalhoub, Deno Lunger, MD      Allergies    Bentyl [dicyclomine]    Review of Systems   Review of Systems  Physical Exam Updated Vital Signs BP (!) 141/81 (BP Location: Left Arm)   Pulse (!) 105   Temp 98.1 F (36.7 C) (Oral)   Resp 18   Ht 5\' 6"  (1.676 m)   Wt (!) 181.4 kg   SpO2 100%   BMI 64.56 kg/m  Physical Exam Vitals and nursing note reviewed.  Constitutional:      Appearance: She is obese.  Eyes:     Pupils: Pupils are equal, round, and reactive to light.  Cardiovascular:     Rate and Rhythm: Regular rhythm.  Pulmonary:     Breath sounds: No rhonchi.     Comments: Lungs clear but somewhat difficult exam due to body habitus. Musculoskeletal:     Comments: Obese  but may have some bilateral lower extremity edema.  Skin:    Capillary Refill: Capillary refill takes less than 2 seconds.  Neurological:     Mental Status: She is alert and oriented to person, place, and time.     ED Results / Procedures / Treatments   Labs (all labs ordered are listed, but only abnormal results are displayed) Labs Reviewed  CBC WITH DIFFERENTIAL/PLATELET - Abnormal; Notable for the following components:      Result Value   Hemoglobin 9.4 (*)    HCT 31.7 (*)    MCV 77.7 (*)    MCH 23.0 (*)    MCHC 29.7 (*)    RDW 19.7 (*)    All other components within normal limits  COMPREHENSIVE METABOLIC PANEL  BRAIN NATRIURETIC PEPTIDE    EKG None  Radiology DG Chest Portable 1 View  Result Date: 05/13/2023 CLINICAL DATA:  sob EXAM: PORTABLE CHEST 1 VIEW COMPARISON:  CXR 11/17/22 FINDINGS: Slightly enlarged cardiac contours, but still within normal limits. No pleural effusion. No pneumothorax. No focal airspace opacity. No radiographically apparent displaced rib fractures. Visualized upper abdomen is unremarkable. IMPRESSION: No focal airspace opacity. No pleural effusion. No pulmonary edema. Electronically Signed   By: Lorenza Cambridge M.D.   On: 05/13/2023 13:38    Procedures Procedures    Medications Ordered in ED Medications - No data to display  ED Course/ Medical Decision Making/ A&P                             Medical Decision Making Amount and/or Complexity of Data Reviewed Labs: ordered. Radiology: ordered.   Patient is shortness of breath.  Has had for the last few days.  Feels as if she may have CHF.  Has had some wheezing.  Chest x-ray done and does not show effusion.  Will get basic blood work.  Differential diagnosis includes dyspnea, CHF, pneumonia.  Hemoglobin mildly higher than has been recently.  CMP and BNP still pending.  Care turned over to Dr Denton Lank        Final Clinical Impression(s) / ED Diagnoses Final diagnoses:  None     Rx / DC Orders ED Discharge Orders     None         Benjiman Core, MD 05/13/23 1529

## 2023-05-13 NOTE — ED Provider Notes (Signed)
Pt signed out that majority of workup is complete, and that BNP is pending - to d/c to home when BNP resulted.   BNP is normal.   Pt is breathing comfortably. Room air sats 100%.   Pt currently appears stable for d/c per Dr Arlington Calix plan. Recommend close outpatient pcp f/u.     Cathren Laine, MD 05/13/23 [phone]

## 2023-05-13 NOTE — Discharge Instructions (Addendum)
It was our pleasure to provide your ER care today - we hope that you feel better.  Overall, your xrays and lab tests look good. You do have chronic anemia - take iron supplement, and follow up with primary care doctor in 1-2 weeks. Also follow up regarding your blood pressure that is mildly high.   For swelling, elevate feet/legs when not up and about, limit salt intake, eat heart healthy meal plan, and follow up with primary care doctor in 1-2 weeks.   Return to ER if worse, new symptoms, fevers, chest pain, increased trouble breathing, or other concern.

## 2023-05-13 NOTE — Progress Notes (Signed)
IV team assessed bilateral arms. No suitable veins seen for peripheral IV access with ultrasound. Recommending central access at this time. Pt reports having to have PICC for access in the past.

## 2023-05-13 NOTE — ED Triage Notes (Signed)
Pt bib EMS due to fluid retention. Lungs clear. Evaluation for possible CHF. PMH colon ca, nephrolithiasis, and chronic pain. Denies any new pain. VS B/P: 144/80, P: 99, O2 Sats: 100%RA, CBG: 150.

## 2023-06-19 ENCOUNTER — Emergency Department (HOSPITAL_COMMUNITY)
Admission: EM | Admit: 2023-06-19 | Discharge: 2023-06-19 | Disposition: A | Discharge status: Home / Self Care | Payer: Medicare HMO | Attending: Emergency Medicine | Admitting: Emergency Medicine

## 2023-06-19 ENCOUNTER — Encounter (HOSPITAL_COMMUNITY): Payer: Self-pay

## 2023-06-19 DIAGNOSIS — Z4800 Encounter for change or removal of nonsurgical wound dressing: Secondary | ICD-10-CM | POA: Diagnosis present

## 2023-06-19 DIAGNOSIS — Z79899 Other long term (current) drug therapy: Secondary | ICD-10-CM | POA: Insufficient documentation

## 2023-06-19 DIAGNOSIS — I10 Essential (primary) hypertension: Secondary | ICD-10-CM | POA: Diagnosis not present

## 2023-06-19 DIAGNOSIS — T148XXA Other injury of unspecified body region, initial encounter: Secondary | ICD-10-CM

## 2023-06-19 MED ORDER — LIDOCAINE-EPINEPHRINE (PF) 2 %-1:200000 IJ SOLN
20.0000 mL | Freq: Once | INTRAMUSCULAR | Status: AC
  Administered 2023-06-19: 20 mL
  Filled 2023-06-19: qty 20

## 2023-06-19 NOTE — Discharge Instructions (Addendum)
Thank you for allowing [place] to be part of your care today.  You were evaluated in the ED for a bleeding wound.  Your wound was closed with absorbable sutures and covered with a guaze that has clotting factors in it (helps the body naturally clot off the wound).  Continue to monitor this area and have your wound care nurse change the dressings as scheduled.   If your wound begins bleeding again, return to the ED immediately for further treatment.

## 2023-06-19 NOTE — ED Triage Notes (Signed)
Bleeding wound to breast area that started today. Excessive bleeding per EMS. Not on blood thinners. EMS reports wound care nurse took off wound dressing to right breast area and it started oozing with bright red blood.

## 2023-06-19 NOTE — ED Notes (Addendum)
Quikclot given to [name], MD

## 2023-06-19 NOTE — ED Notes (Signed)
[facility] called. Will be here in about an hour

## 2023-06-19 NOTE — ED Provider Notes (Signed)
..  Laceration Repair  Date/Time: 06/19/2023 5:36 PM  Performed by: Linwood Dibbles, MD Authorized by: Linwood Dibbles, MD   Consent:    Consent obtained:  Verbal   Consent given by:  Patient Universal protocol:    Patient identity confirmed:  Verbally with patient Anesthesia:    Anesthesia method:  Local infiltration   Local anesthetic:  Lidocaine 2% WITH epi Laceration details:    Location: right breast.   Length (cm):  0 Pre-procedure details:    Preparation:  Patient was prepped and draped in usual sterile fashion Exploration:    Hemostasis achieved with:  Tied off vessels Treatment:    Area cleansed with:  Chlorhexidine Skin repair:    Repair method:  Sutures   Suture size:  4-0   Suture material:  Chromic gut   Suture technique:  Figure eight   Number of sutures:  2 Approximation:    Approximation:  Close Repair type:    Repair type:  Simple Post-procedure details:    Dressing:  Antibiotic ointment   Procedure completion:  Tolerated well, no immediate complications Comments:     Pt with a bleeding vessel from a superficial breast wound in her right breast.  Constant vascular bleeding.  No pulsatile flow.  2 figure-of-eight sutures placed for hemostasis.  Pt presented with a vascular bleed from a superficial breast wound.  Likely venous in nature.  Bleeding controlled with figure of eight sutures.  I provided a substantive portion of the care of this patient.  I personally made/approved the management plan for this patient and take responsibility for the patient management.       Linwood Dibbles, MD 06/19/23 2116

## 2023-06-19 NOTE — ED Provider Notes (Signed)
[place] EMERGENCY DEPARTMENT AT [facility] Provider Note   CSN: [id number] Arrival date & time: 06/19/23  1719     History  Chief Complaint  Patient presents with   Wound Check    Ellen Hill is a 52 y.o. female with past medical history significant for anemia, irregular bleeding, obesity, hypertension, paraplegia presents to the ED with a bleeding ulcerative wound to the right breast that began today.  Patient was at home when a wound care nurse took off wound dressing and it began to have brisk bright red bleeding.  Per EMS, she had excessive bleeding that they were unable to control with pressure.  Patient does not take blood thinners.  Denies weakness, syncope, fever.        Home Medications Prior to Admission medications   Medication Sig Start Date End Date Taking? Authorizing Provider  acetaminophen (TYLENOL) 500 MG tablet Take 2 tablets (1,000 mg total) by mouth every 6 (six) hours. 11/29/22   Eric Form, PA-C  baclofen (LIORESAL) 10 MG tablet TAKE 1 TABLET TWICE A DAY AT 8 AM AND 1 PM WITH 5 MG TABLET. Patient taking differently: Take 10 mg by mouth 2 (two) times daily. 01/23/22   Deeann Saint, MD  baclofen (LIORESAL) 20 MG tablet TAKE 1 TABLET EVERY DAY AT 6 PM Patient taking differently: Take 20 mg by mouth at bedtime. 09/07/21   Deeann Saint, MD  cetirizine (ZYRTEC) 10 MG tablet Take 10 mg by mouth daily.    [provider]  diphenhydrAMINE (BENADRYL) 25 mg capsule Take 25 mg by mouth every 6 (six) hours as needed for itching or allergies.    [provider]  gabapentin (NEURONTIN) 600 MG tablet Take 1 tablet (600 mg total) by mouth 3 (three) times daily. 12/06/22   Shalhoub, Deno Lunger, MD  hydrOXYzine (ATARAX) 25 MG tablet Take 1 tablet (25 mg total) by mouth 2 (two) times daily as needed for itching. 12/06/22   Shalhoub, Deno Lunger, MD  megestrol (MEGACE) 40 MG tablet Take 2 tablets (80 mg total) by mouth 2 (two) times  daily. 12/06/22   Shalhoub, Deno Lunger, MD  nystatin (MYCOSTATIN/NYSTOP) powder Apply 1 Application topically 2 times daily at 12 noon and 4 pm. 12/06/22   Shalhoub, Deno Lunger, MD  oxyCODONE-acetaminophen (PERCOCET/ROXICET) 5-325 MG tablet Take 1 tablet by mouth every 8 (eight) hours as needed for severe pain.    [provider]  pantoprazole (PROTONIX) 40 MG tablet Take 1 tablet (40 mg total) by mouth daily. 12/06/22   Shalhoub, Deno Lunger, MD  traZODone (DESYREL) 50 MG tablet Take 1 tablet (50 mg total) by mouth at bedtime as needed for sleep. 12/06/22   Shalhoub, Deno Lunger, MD      Allergies    Bentyl [dicyclomine]    Review of Systems   Review of Systems  Constitutional:  Negative for fever.  Skin:  Positive for wound.  Neurological:  Negative for syncope and weakness.  Hematological:  Bruises/bleeds easily.    Physical Exam Updated Vital Signs BP 125/71   Pulse 96   Temp 99.3 F (37.4 C) (Oral)   Resp 16   Ht 5\' 6"  (1.676 m)   Wt (!) 181.4 kg   SpO2 100%   BMI 64.55 kg/m  Physical Exam Vitals and nursing note reviewed. Exam conducted with a chaperone present.  Constitutional:      General: She is not in acute distress.    Appearance: Normal appearance.  She is not ill-appearing or diaphoretic.  Cardiovascular:     Rate and Rhythm: Normal rate and regular rhythm.  Pulmonary:     Effort: Pulmonary effort is normal.  Chest:    Skin:    General: Skin is warm and dry.     Capillary Refill: Capillary refill takes less than 2 seconds.  Neurological:     Mental Status: She is alert. Mental status is at baseline.  Psychiatric:        Mood and Affect: Mood normal.        Behavior: Behavior normal.     ED Results / Procedures / Treatments   Labs (all labs ordered are listed, but only abnormal results are displayed) Labs Reviewed - No data to display  EKG None  Radiology No results found.  Procedures Procedures    Medications Ordered in ED Medications   lidocaine-EPINEPHrine (XYLOCAINE W/EPI) 2 %-1:200000 (PF) injection 20 mL (20 mLs Infiltration Given 06/19/23 1733)    ED Course/ Medical Decision Making/ A&P                             Medical Decision Making Risk Prescription drug management.   This patient presents to the ED with chief complaint(s) of bleeding from the breast with pertinent past medical history of obesity, wounds requiring wound care.  The complaint involves an extensive differential diagnosis and also carries with it a high risk of complications and morbidity.    The differential diagnosis includes venous or arterial bleed secondary to worsening ulcerative wound   Initial Assessment:   Exam significant for brisk bright red bleeding coming from ulcer/wound on patient's right breast.  There are multiple wounds to patient's breast.  There is no surrounding erythema and no abnormal discharge.  Direct pressure was held constantly until repair was made.    Treatment and Reassessment: Patient's bleeding wound was closed by attending, Linwood Dibbles, and hemostasis achieved.  See provider's note for further detail.  Quikclot dressing applied and patient was observed in ED for recurrence of bleeding.  Upon reassessment, patient has not had any further bleeding and is appropriate for discharge home.   Disposition:   Patient to be discharged home and wound care that comes to house to continue caring for patient's wounds.  Instructed patient to return to ED if bleeding recurs.  After period of observation, patient did not appear to have further bleeding and guaze did not have significant blood present.  The patient has been appropriately medically screened and/or stabilized in the ED. I have low suspicion for any other emergent medical condition which would require further screening, evaluation or treatment in the ED or require inpatient management. At time of discharge the patient is hemodynamically stable and in no acute distress. I  have discussed work-up results and diagnosis with patient and answered all questions. Patient is agreeable with discharge plan. We discussed strict return precautions for returning to the emergency department and they verbalized understanding.            Final Clinical Impression(s) / ED Diagnoses Final diagnoses:  Bleeding from wound    Rx / DC Orders ED Discharge Orders     None         Lenard Simmer, PA-C 06/19/23 1904    Linwood Dibbles, MD 06/20/23 [phone]

## 2023-08-27 ENCOUNTER — Telehealth: Payer: Self-pay | Admitting: Hematology

## 2023-08-27 NOTE — Telephone Encounter (Signed)
Scheduled appointment per referral. Patient is aware of appointment date and time. Patient is aware to arrive 15 mins prior to appointment time and to bring updated insurance cards. Patient is aware of location.   

## 2023-09-01 ENCOUNTER — Encounter: Payer: Medicare HMO | Admitting: Nurse Practitioner

## 2023-09-01 ENCOUNTER — Other Ambulatory Visit: Payer: Medicare HMO

## 2023-09-04 ENCOUNTER — Inpatient Hospital Stay: Payer: Medicare HMO | Attending: Nurse Practitioner | Admitting: Nurse Practitioner

## 2023-09-04 ENCOUNTER — Inpatient Hospital Stay: Payer: Medicare HMO

## 2023-09-04 NOTE — Progress Notes (Deleted)
Patient Care Team: [facility] as PCP - General   CHIEF COMPLAINT: Follow-up IDA and history of colon cancer  HISTORY OF PRESENT ILLNESS: Ms. Ellen Hill was initially seen by Dr. Mosetta Putt in the hospital 11/20/22 while admitted for severe anemia hgb 6.8 s/p IV Feraheme and Ferrlecit and 2 units blood transfusion. Work up which revealed 2 colon masses, no metastatic disease. S/p right colectomy, sigmoid resection, and end colostomy 11/22/22 by Dr. Sheliah Hatch. 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 Adjuvant chemo was not recommended.   INTERVAL HISTORY   ROS   Past Medical History:  Diagnosis Date  . Abnormal Pap smear    history  . Anemia   . ASCUS (atypical squamous cells of undetermined significance) on Pap smear 2008   At 36wk of pregnancy ; colpo  . Asthma    rarely uses inhaler (seasonal)  . 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 Constipation 11/14/2022  . Fibroids   . H/O candidiasis   . H/O fatigue 08/2007  . H/O varicella   . Headache(784.0)    migraines   . Heart murmur    dx child - no problems as adult  . Hx: UTI (urinary tract infection) 07/29/2007  . Increased BMI   . Irregular bleeding   . Migraines   . Obesity 02/13/2012  . Pelvic pain 02/13/2012  . Postpartum anemia 08/18/2007  . 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 after loss of parents, is fine  . Vaginal Pap smear, abnormal    f/u wnl     Past Surgical History:  Procedure Laterality Date  . BIOPSY  11/19/2022   Procedure: BIOPSY;  Surgeon: Lynann Bologna, DO;  Location: [facility];  Service: Gastroenterology;;  . CERVICAL POLYPECTOMY  05/01/2012   Procedure: CERVICAL POLYPECTOMY;  Surgeon: Michael Litter, MD;  Location: [facility];  Service: Gynecology;  Laterality: N/A;  . CESAREAN SECTION  07/04/2007  . CHOLECYSTECTOMY  07/06/2013  . CHOLECYSTECTOMY N/A 07/06/2013   Procedure: LAPAROSCOPIC  CHOLECYSTECTOMY ;  Surgeon: [name], MD;  Location: [facility] OR;  Service: General;  Laterality: N/A;  attempted cholangiogram  . COLONOSCOPY WITH PROPOFOL N/A 11/19/2022   Procedure: COLONOSCOPY WITH PROPOFOL;  Surgeon: Lynann Bologna, DO;  Location: [facility];  Service: Gastroenterology;  Laterality: N/A;  . fibroidecto    . fibroidectomy  07/04/2007  . LAPAROSCOPIC PARTIAL RIGHT COLECTOMY N/A 11/22/2022   Procedure: LAPAROSCOPIC RIGHT COLECTOMY WITH ANASTOMOSIS, SIGMOID RESECTION WITH COLOSTOMY, AND BIOPSY OF ABDOMINAL SKIN;  Surgeon: Kinsinger, De Blanch, MD;  Location: [facility];  Service: General;  Laterality: N/A;  . LUMBAR LAMINECTOMY/DECOMPRESSION MICRODISCECTOMY N/A 01/19/2019   Procedure: Thoracic Nine-Lumbar One Decompressive Laminectomy;  Surgeon: Julio Sicks, MD;  Location: [facility];  Service: Neurosurgery;  Laterality: N/A;  posterior  . MYOMECTOMY  07/04/08  . OPERATIVE HYSTEROSCOPY  05/01/2012   no menses since that time  . SUBMUCOSAL TATTOO INJECTION  11/19/2022   Procedure: SUBMUCOSAL TATTOO INJECTION;  Surgeon: Lynann Bologna, DO;  Location: [facility];  Service: Gastroenterology;;  . TUBAL LIGATION  07/04/2007     Outpatient Encounter Medications as of 09/04/2023  Medication Sig  . acetaminophen (TYLENOL) 500 MG tablet Take 2 tablets (1,000 mg total) by mouth every 6 (six) hours.  . baclofen (LIORESAL) 10 MG tablet TAKE 1 TABLET TWICE A DAY AT 8 AM AND 1 PM WITH 5 MG TABLET. (Patient taking differently: Take  10 mg by mouth 2 (two) times daily.)  . baclofen (LIORESAL) 20 MG tablet TAKE 1 TABLET EVERY DAY AT 6 PM (Patient taking differently: Take 20 mg by mouth at bedtime.)  . cetirizine (ZYRTEC) 10 MG tablet Take 10 mg by mouth daily.  . diphenhydrAMINE (BENADRYL) 25 mg capsule Take 25 mg by mouth every 6 (six) hours as needed for itching or allergies.  Marland Kitchen gabapentin (NEURONTIN) 600 MG tablet Take 1 tablet (600 mg total) by mouth 3 (three) times daily.  . hydrOXYzine  (ATARAX) 25 MG tablet Take 1 tablet (25 mg total) by mouth 2 (two) times daily as needed for itching.  . megestrol (MEGACE) 40 MG tablet Take 2 tablets (80 mg total) by mouth 2 (two) times daily.  Marland Kitchen nystatin (MYCOSTATIN/NYSTOP) powder Apply 1 Application topically 2 times daily at 12 noon and 4 pm.  . oxyCODONE-acetaminophen (PERCOCET/ROXICET) 5-325 MG tablet Take 1 tablet by mouth every 8 (eight) hours as needed for severe pain.  . pantoprazole (PROTONIX) 40 MG tablet Take 1 tablet (40 mg total) by mouth daily.  . traZODone (DESYREL) 50 MG tablet Take 1 tablet (50 mg total) by mouth at bedtime as needed for sleep.   No facility-administered encounter medications on file as of 09/04/2023.     There were no vitals filed for this visit. There is no height or weight on file to calculate BMI.   PHYSICAL EXAM GENERAL:alert, no distress and comfortable SKIN: no rash  EYES: sclera clear NECK: without mass LYMPH:  no palpable cervical or supraclavicular lymphadenopathy  LUNGS: clear with normal breathing effort HEART: regular rate & rhythm, no lower extremity edema ABDOMEN: abdomen soft, non-tender and normal bowel sounds NEURO: alert & oriented x 3 with fluent speech, no focal motor/sensory deficits Breast exam:  PAC without erythema    CBC    Component Value Date/Time   WBC 7.8 05/13/2023 1450   RBC 4.08 05/13/2023 1450   HGB 9.4 (L) 05/13/2023 1450   HCT 31.7 (L) 05/13/2023 1450   PLT 332 05/13/2023 1450   MCV 77.7 (L) 05/13/2023 1450   MCH 23.0 (L) 05/13/2023 1450   MCHC 29.7 (L) 05/13/2023 1450   RDW 19.7 (H) 05/13/2023 1450   LYMPHSABS 1.6 05/13/2023 1450   MONOABS 0.4 05/13/2023 1450   EOSABS 0.3 05/13/2023 1450   BASOSABS 0.0 05/13/2023 1450     CMP     Component Value Date/Time   NA 141 05/13/2023 1450   K 3.5 05/13/2023 1450   CL 114 (H) 05/13/2023 1450   CO2 19 (L) 05/13/2023 1450   GLUCOSE 97 05/13/2023 1450   BUN 7 05/13/2023 1450   CREATININE 0.77 05/13/2023  1450   CALCIUM 8.4 (L) 05/13/2023 1450   PROT 7.2 05/13/2023 1450   ALBUMIN 2.6 (L) 05/13/2023 1450   AST 17 05/13/2023 1450   ALT 12 05/13/2023 1450   ALKPHOS 63 05/13/2023 1450   BILITOT 0.4 05/13/2023 1450   GFRNONAA >60 05/13/2023 1450   GFRAA >60 03/01/2019 0612     ASSESSMENT & PLAN:  PLAN:  No orders of the defined types were placed in this encounter.     All questions were answered. The patient knows to call the clinic with any problems, questions or concerns. No barriers to learning were detected. I spent *** counseling the patient face to face. The total time spent in the appointment was *** and more than 50% was on counseling, review of test results, and coordination of care.  Santiago Glad, NP-C @DATE @

## 2023-10-10 ENCOUNTER — Inpatient Hospital Stay: Payer: Medicare HMO | Attending: Nurse Practitioner | Admitting: Hematology and Oncology

## 2023-10-10 ENCOUNTER — Inpatient Hospital Stay: Payer: Medicare HMO

## 2023-10-11 ENCOUNTER — Telehealth: Payer: Self-pay | Admitting: Hematology and Oncology

## 2023-10-22 ENCOUNTER — Inpatient Hospital Stay: Payer: Medicare HMO | Admitting: Hematology and Oncology

## 2023-10-22 ENCOUNTER — Telehealth: Payer: Self-pay | Admitting: Hematology

## 2023-10-22 ENCOUNTER — Inpatient Hospital Stay: Payer: Medicare HMO

## 2023-10-27 ENCOUNTER — Encounter: Payer: Medicare HMO | Admitting: Hematology

## 2023-10-27 ENCOUNTER — Other Ambulatory Visit: Payer: Medicare HMO

## 2023-10-28 ENCOUNTER — Inpatient Hospital Stay: Payer: Medicare HMO | Attending: Nurse Practitioner

## 2023-10-28 ENCOUNTER — Inpatient Hospital Stay (HOSPITAL_BASED_OUTPATIENT_CLINIC_OR_DEPARTMENT_OTHER): Payer: Medicare HMO | Attending: Nurse Practitioner | Admitting: Hematology

## 2023-10-28 ENCOUNTER — Encounter: Payer: Self-pay | Admitting: Hematology

## 2023-10-28 VITALS — BP 143/74 | HR 102 | Temp 98.9°F | Resp 18

## 2023-10-28 DIAGNOSIS — D649 Anemia, unspecified: Secondary | ICD-10-CM

## 2023-10-28 DIAGNOSIS — Z993 Dependence on wheelchair: Secondary | ICD-10-CM | POA: Insufficient documentation

## 2023-10-28 DIAGNOSIS — Z9049 Acquired absence of other specified parts of digestive tract: Secondary | ICD-10-CM | POA: Diagnosis not present

## 2023-10-28 DIAGNOSIS — Z87891 Personal history of nicotine dependence: Secondary | ICD-10-CM | POA: Insufficient documentation

## 2023-10-28 DIAGNOSIS — G8929 Other chronic pain: Secondary | ICD-10-CM

## 2023-10-28 DIAGNOSIS — Z87440 Personal history of urinary (tract) infections: Secondary | ICD-10-CM

## 2023-10-28 DIAGNOSIS — Z85038 Personal history of other malignant neoplasm of large intestine: Secondary | ICD-10-CM | POA: Diagnosis not present

## 2023-10-28 DIAGNOSIS — Z79899 Other long term (current) drug therapy: Secondary | ICD-10-CM

## 2023-10-28 DIAGNOSIS — Z8042 Family history of malignant neoplasm of prostate: Secondary | ICD-10-CM

## 2023-10-28 DIAGNOSIS — Z8249 Family history of ischemic heart disease and other diseases of the circulatory system: Secondary | ICD-10-CM | POA: Diagnosis not present

## 2023-10-28 DIAGNOSIS — Z833 Family history of diabetes mellitus: Secondary | ICD-10-CM | POA: Diagnosis not present

## 2023-10-28 DIAGNOSIS — Z803 Family history of malignant neoplasm of breast: Secondary | ICD-10-CM

## 2023-10-28 LAB — CBC WITH DIFFERENTIAL/PLATELET
Abs Immature Granulocytes: 0.04 10*3/uL (ref 0.00–0.07)
Basophils Absolute: 0 10*3/uL (ref 0.0–0.1)
Basophils Relative: 0 %
Eosinophils Absolute: 0.1 10*3/uL (ref 0.0–0.5)
Eosinophils Relative: 1 %
HCT: 33.7 % — ABNORMAL LOW (ref 36.0–46.0)
Hemoglobin: 10.1 g/dL — ABNORMAL LOW (ref 12.0–15.0)
Immature Granulocytes: 1 %
Lymphocytes Relative: 24 %
Lymphs Abs: 2 10*3/uL (ref 0.7–4.0)
MCH: 21.5 pg — ABNORMAL LOW (ref 26.0–34.0)
MCHC: 30 g/dL (ref 30.0–36.0)
MCV: 71.9 fL — ABNORMAL LOW (ref 80.0–100.0)
Monocytes Absolute: 0.4 10*3/uL (ref 0.1–1.0)
Monocytes Relative: 5 %
Neutro Abs: 5.7 10*3/uL (ref 1.7–7.7)
Neutrophils Relative %: 69 %
Platelets: 233 10*3/uL (ref 150–400)
RBC: 4.69 MIL/uL (ref 3.87–5.11)
RDW: 21.4 % — ABNORMAL HIGH (ref 11.5–15.5)
WBC: 8.3 10*3/uL (ref 4.0–10.5)
nRBC: 0 % (ref 0.0–0.2)

## 2023-10-28 LAB — IRON AND IRON BINDING CAPACITY (CC-WL,HP ONLY)
Iron: 24 ug/dL — ABNORMAL LOW (ref 28–170)
Saturation Ratios: 6 % — ABNORMAL LOW (ref 10.4–31.8)
TIBC: 414 ug/dL (ref 250–450)
UIBC: 390 ug/dL (ref 148–442)

## 2023-10-28 LAB — VITAMIN B12: Vitamin B-12: 263 pg/mL (ref 180–914)

## 2023-10-28 NOTE — Progress Notes (Signed)
[facility]   [phone] [phone]   Clinic New Consult Note   Patient Care Team: [facility] as PCP - General 10/28/2023  CHIEF COMPLAINTS/PURPOSE OF CONSULTATION:  Anemia   REFERRAL PHYSICIAN: Alcide Clever NP    Discussed the use of AI scribe software for clinical note transcription with the patient, who gave verbal consent to proceed.  History of Present Illness   The patient, a 52 year old female with a history of anemia, colon cancer and morbid obesity, presents for a new consult of chronic anemia.. She has been anemic for most of her life and has been on iron supplementation (Ferrous Sulfate 325mg  daily) for the past two months. She reports no improvement in her anemia since starting the iron supplementation.  The patient has a history of heavy and constant menstrual bleeding, which ceased after starting on Megace in December of the previous year. She also has a history of colon cancer, for which she underwent surgery in December of 2023. She has not had a repeat colonoscopy since the surgery.  The patient is wheelchair-bound due to a back surgery (Laminectomy, decompression) in January 2020, after which she lost the ability to walk. She experiences pain in her legs due to nerve damage from the surgery.  She also has a history of abnormal Pap smear, migraine, and has undergone surgeries for colon cancer, C-section, cervical polypectomy, fibroid removal, hysteroscopies, tubal ligation, and gallbladder removal. She has no history of diabetes, high blood pressure, or heart disease.         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 after loss of parents, is fine   Vaginal Pap smear, abnormal    f/u wnl    SURGICAL HISTORY: Past Surgical History:  Procedure Laterality Date   BIOPSY  11/19/2022   Procedure: BIOPSY;  Surgeon: Lynann Bologna, DO;  Location: [facility];  Service: Gastroenterology;;   CERVICAL POLYPECTOMY  05/01/2012   Procedure: CERVICAL POLYPECTOMY;  Surgeon: Michael Litter, MD;  Location: [facility];  Service: Gynecology;  Laterality: N/A;   CESAREAN SECTION  07/04/2007   CHOLECYSTECTOMY  07/06/2013   CHOLECYSTECTOMY N/A 07/06/2013   Procedure: LAPAROSCOPIC CHOLECYSTECTOMY ;  Surgeon: [name], MD;  [facility];  Service: General;  Laterality: N/A;  attempted cholangiogram   COLONOSCOPY WITH PROPOFOL N/A 11/19/2022   Procedure: COLONOSCOPY WITH PROPOFOL;  Surgeon: Lynann Bologna, DO;  Location: [facility];  Service: Gastroenterology;  Laterality: N/A;   fibroidecto     fibroidectomy  07/04/2007   LAPAROSCOPIC PARTIAL RIGHT COLECTOMY N/A 11/22/2022   Procedure: LAPAROSCOPIC RIGHT COLECTOMY WITH ANASTOMOSIS, SIGMOID RESECTION WITH COLOSTOMY, AND BIOPSY OF  ABDOMINAL SKIN;  Surgeon: Sheliah Hatch, De Blanch, MD;  Location: [facility];  Service: General;  Laterality: N/A;   LUMBAR LAMINECTOMY/DECOMPRESSION MICRODISCECTOMY N/A 01/19/2019   Procedure: Thoracic Nine-Lumbar One Decompressive Laminectomy;  Surgeon: Julio Sicks, MD;  Location: [facility];  Service: Neurosurgery;  Laterality: N/A;  posterior   MYOMECTOMY  07/04/08   OPERATIVE HYSTEROSCOPY  05/01/2012   no menses since that time   SUBMUCOSAL TATTOO INJECTION  11/19/2022   Procedure:  SUBMUCOSAL TATTOO INJECTION;  Surgeon: Lynann Bologna, DO;  Location: [facility];  Service: Gastroenterology;;   TUBAL LIGATION  07/04/2007    SOCIAL HISTORY: Social History   Socioeconomic History   Marital status: Single    Spouse name: Not on file   Number of children: 1   Years of education: Not on file   Highest education level: Not on file  Occupational History   Not on file  Tobacco Use   Smoking status: Former    Current packs/day: 0.00    Average packs/day: 0.3 packs/day for 1 year (0.3 ttl pk-yrs)    Types: Cigarettes    Start date: 03/17/1998    Quit date: 03/18/1999    Years since quitting: 24.6   Smokeless tobacco: Never  Vaping Use   Vaping status: Never Used  Substance and Sexual Activity   Alcohol use: No   Drug use: No   Sexual activity: Not Currently    Birth control/protection: Surgical  Other Topics Concern   Not on file  Social History Narrative   Not on file   Social Determinants of Health   Financial Resource Strain: Not on file  Food Insecurity: No Food Insecurity (11/15/2022)   Hunger Vital Sign    Worried About Running Out of Food in the Last Year: Never true    Ran Out of Food in the Last Year: Never true  Transportation Needs: No Transportation Needs (11/14/2022)   PRAPARE - Administrator, Civil Service (Medical): No    Lack of Transportation (Non-Medical): No  Physical Activity: Not on file  Stress: Not on file  Social Connections: Not on file  Intimate Partner Violence: Not At Risk (11/14/2022)   Humiliation, Afraid, Rape, and Kick questionnaire    Fear of Current or Ex-Partner: No    Emotionally Abused: No    Physically Abused: No    Sexually Abused: No    FAMILY HISTORY: Family History  Problem Relation Age of Onset   Hypertension Mother    Cancer Mother        Breast   Breast cancer Mother    Hypertension Father    Cancer Father        Prostate   Heart attack Father    Hypertension Sister    Diabetes  Maternal Aunt     ALLERGIES:  is allergic to bentyl [dicyclomine].  MEDICATIONS:  Current Outpatient Medications  Medication Sig Dispense Refill   acetaminophen (TYLENOL) 500 MG tablet Take 2 tablets (1,000 mg total) by mouth every 6 (six) hours. 30 tablet 0   baclofen (LIORESAL) 10 MG tablet TAKE 1 TABLET TWICE A DAY AT 8 AM AND 1 PM WITH 5 MG TABLET. (Patient taking differently: Take 10 mg by mouth 2 (two) times daily.) 180 tablet 1   baclofen (LIORESAL) 20 MG tablet TAKE 1 TABLET EVERY DAY AT 6 PM (Patient taking differently: Take 20 mg by mouth at bedtime.) 30 tablet 4   cetirizine (ZYRTEC) 10 MG tablet Take 10 mg by mouth daily.  diphenhydrAMINE (BENADRYL) 25 mg capsule Take 25 mg by mouth every 6 (six) hours as needed for itching or allergies.     gabapentin (NEURONTIN) 600 MG tablet Take 1 tablet (600 mg total) by mouth 3 (three) times daily. 90 tablet 1   hydrOXYzine (ATARAX) 25 MG tablet Take 1 tablet (25 mg total) by mouth 2 (two) times daily as needed for itching. 30 tablet 0   megestrol (MEGACE) 40 MG tablet Take 2 tablets (80 mg total) by mouth 2 (two) times daily. 60 tablet 0   nystatin (MYCOSTATIN/NYSTOP) powder Apply 1 Application topically 2 times daily at 12 noon and 4 pm. 60 g 0   oxyCODONE-acetaminophen (PERCOCET/ROXICET) 5-325 MG tablet Take 1 tablet by mouth every 8 (eight) hours as needed for severe pain.     pantoprazole (PROTONIX) 40 MG tablet Take 1 tablet (40 mg total) by mouth daily. 30 tablet 2   traZODone (DESYREL) 50 MG tablet Take 1 tablet (50 mg total) by mouth at bedtime as needed for sleep. 30 tablet 0   No current facility-administered medications for this visit.    REVIEW OF SYSTEMS:   Constitutional: Denies fevers, chills or abnormal night sweats Eyes: Denies blurriness of vision, double vision or watery eyes Ears, nose, mouth, throat, and face: Denies mucositis or sore throat Respiratory: Denies cough, dyspnea or wheezes Cardiovascular: Denies  palpitation, chest discomfort or lower extremity swelling Gastrointestinal:  Denies nausea, heartburn or change in bowel habits Skin: Denies abnormal skin rashes Lymphatics: Denies new lymphadenopathy or easy bruising Neurological:Denies numbness, tingling or new weaknesses Behavioral/Psych: Mood is stable, no new changes  All other systems were reviewed with the patient and are negative.  PHYSICAL EXAMINATION: ECOG PERFORMANCE STATUS: 3 - Symptomatic, >50% confined to bed  Vitals:   10/28/23 1407  BP: (!) 143/74  Pulse: (!) 102  Resp: 18  Temp: 98.9 F (37.2 C)  SpO2: 100%   Filed Weights    GENERAL:alert, no distress and comfortable, obese female  SKIN: skin color, texture, turgor are normal, no rashes or significant lesions EYES: normal, conjunctiva are pink and non-injected, sclera clear OROPHARYNX:no exudate, no erythema and lips, buccal mucosa, and tongue normal  NECK: supple, thyroid normal size, non-tender, without nodularity LYMPH:  no palpable lymphadenopathy in the cervical, axillary or inguinal LUNGS: clear to auscultation and percussion with normal breathing effort HEART: regular rate & rhythm and no murmurs and no lower extremity edema ABDOMEN:abdomen soft, non-tender and normal bowel sounds Musculoskeletal:no cyanosis of digits and no clubbing  PSYCH: alert & oriented x 3 with fluent speech NEURO: no focal motor/sensory deficits   LABORATORY DATA:  I have reviewed the data as listed    Latest Ref Rng & Units 10/28/2023    2:31 PM 05/13/2023    2:50 PM 12/06/2022    3:57 AM  CBC  WBC 4.0 - 10.5 K/uL 8.3  7.8  11.8   Hemoglobin 12.0 - 15.0 g/dL 65.7  9.4  7.9   Hematocrit 36.0 - 46.0 % 33.7  31.7  27.7   Platelets 150 - 400 K/uL 233  332  409     @cmpl @  RADIOGRAPHIC STUDIES: I have personally reviewed the radiological images as listed and agreed with the findings in the report. No results found.  ASSESSMENT & PLAN:  52 yo female     Chronic  Anemia Longstanding anemia, with previous lab evidence of iron deficiency, likely due to heavy menstrual bleeding and colon cancer. Currently on Ferrous Sulfate 325mg  daily  for the past two months. No significant improvement noted. -Her previous lab also showed a low folate and B12 level -Repeat CBC, B12, folate and iron studies today. -Consider B12 and folate supplementation due to previous low levels. -if she has significantly low iron, will consider iv iron -If anemia does not improve with nutritional supplementation, consider further testing for other causes of anemia.  Colon Cancer, stage IIA Status post colon resection in December 2023 for T3N0 stage 2 colon cancer. No colonoscopy since surgery. -Check tumor markers today. -Schedule colonoscopy for January 2025 as per standard follow-up protocol.  Mobility Issues Non-ambulatory status post back surgery in January 2020. 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 -No changes to current management plan.  General Health Maintenance -Consider use of transportation services for medical appointments. -Review and update medication list.      Plan -lab today  -will call her with result and treatment recommendations    Orders Placed This Encounter  Procedures   CBC with Differential/Platelet    Standing Status:   Standing    Number of Occurrences:   50    Standing Expiration Date:   10/27/2024   CEA (IN HOUSE-[facility])    Standing Status:   Standing    Number of Occurrences:   20    Standing Expiration Date:   10/27/2024   Ferritin    Standing Status:   Standing    Number of Occurrences:   20    Standing Expiration Date:   10/27/2024   Vitamin B12    Standing Status:   Standing    Number of Occurrences:   10    Standing Expiration Date:   10/27/2024   Folate RBC    Standing Status:   Future    Number of Occurrences:   1    Standing Expiration Date:   10/27/2024   Iron and Iron Binding Capacity ([facility] only)    All questions were  answered. The patient knows to call the clinic with any problems, questions or concerns. I spent 35 minutes counseling the patient face to face. The total time spent in the appointment was 45 minutes and more than 50% was on counseling.     [name], MD 10/28/2023

## 2023-10-29 ENCOUNTER — Telehealth: Payer: Self-pay | Admitting: Hematology

## 2023-10-29 LAB — CEA (IN HOUSE-CHCC): CEA (CHCC-In House): 2.38 ng/mL (ref 0.00–5.00)

## 2023-10-29 LAB — FOLATE RBC
Folate, Hemolysate: 213 ng/mL
Folate, RBC: 609 ng/mL (ref >498)
Hematocrit: 35 % (ref 34.0–46.6)

## 2023-10-29 LAB — FERRITIN: Ferritin: 26 ng/mL (ref 11–307)

## 2023-11-04 ENCOUNTER — Inpatient Hospital Stay (HOSPITAL_BASED_OUTPATIENT_CLINIC_OR_DEPARTMENT_OTHER): Payer: Medicare HMO | Admitting: Hematology

## 2023-11-04 ENCOUNTER — Encounter: Payer: Self-pay | Admitting: Hematology

## 2023-11-04 DIAGNOSIS — D500 Iron deficiency anemia secondary to blood loss (chronic): Secondary | ICD-10-CM

## 2023-11-04 DIAGNOSIS — C189 Malignant neoplasm of colon, unspecified: Secondary | ICD-10-CM | POA: Diagnosis not present

## 2023-11-04 NOTE — Assessment & Plan Note (Signed)
stage IIA Status post colon resection in December 2023 for T3N0 stage 2 colon cancer. No colonoscopy since surgery. -Tumor marker CEA was normal in November 2024 -Schedule colonoscopy for January 2025 as per standard follow-up protocol. -Continue colon cancer surveillance

## 2023-11-04 NOTE — Progress Notes (Signed)
[facility]   [phone] [phone]   Clinic Follow up Note   Patient Care Team: [facility] as PCP - General 11/04/2023  I connected with Ellen Hill on 11/04/23 at  3:00 PM EST by telephone and verified that I am speaking with the correct person using two identifiers.   I discussed the limitations, risks, security and privacy concerns of performing an evaluation and management service by telephone and the availability of in person appointments. I also discussed with the patient that there may be a patient responsible charge related to this service. The patient expressed understanding and agreed to proceed.   Patient's location:  Home  Provider's location:  Office    CHIEF COMPLAINT: anemia    CURRENT THERAPY:  Oral iron  IV iron if needed when ferritin less than 30 Will started oral B12   Oncology history Anemia Longstanding anemia, with previous lab evidence of iron deficiency, likely due to heavy menstrual bleeding and colon cancer. Currently on Ferrous Sulfate 325mg  daily for the past two months. No significant improvement noted. -Her previous lab also showed a low folate and B12 level -Repeated lab on October 24, 2023 showed slightly low iron with ferritin 12, and B12 level 214 -I recommend oral iron and B12 supplement  Colon adenocarcinoma (HCC) stage IIA Status post colon resection in December 2023 for T3N0 stage 2 colon cancer. No colonoscopy since surgery. -Tumor marker CEA was normal in November 2024 -Schedule colonoscopy for January 2025 as per standard follow-up protocol. 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 Discussed switching to IV iron for faster results and potential energy boost. Patient prefers IV iron. Informed about  premedication for allergic reactions. - Order IV iron (Venofer 200 mg) via [facility] - Premedicate for potential allergic reactions - Repeat labs one month post-IV iron - Follow-up in five months  Vitamin B12 Deficiency Slightly low B12 levels. Recommended starting oral B12 supplementation with potential for injections if levels do not improve. - Recommend OTC oral B12 supplementation daily  - Monitor B12 levels and consider injections if no improvement  Follow-up -lab reviewed -will send iv venofer 200mg  X3 to her home care agent and MD, per her request  -lab in 1, 3 months   Schedule lab appointment and follow-up visit in five months.         SUMMARY OF ONCOLOGIC HISTORY: Oncology History  Colon adenocarcinoma (HCC)  11/20/2022 Initial Diagnosis   Colon adenocarcinoma (HCC)   11/22/2022 Cancer Staging   Staging form: Colon and Rectum, AJCC 8th Edition - Pathologic stage from 11/22/2022: Stage IIA (pT3, pN0, cM0) - Signed by [name], MD on 10/28/2023 Total positive nodes: 0 Histologic grading system: 4 grade system Histologic grade (G): G1 Residual tumor (R): R0 - None     Discussed the use of AI scribe software for clinical note transcription with the patient, who gave verbal consent to proceed.  History of Present Illness   The patient, with a history of anemia, has been taking oral iron for two months. She reports that her hemoglobin has improved from 9.4 five months ago to 10.1 currently, but it is still not back to normal. Her ferritin level has also improved from 3 to 26, which is in the normal range but on the low side. She  also has a slightly low B12 level. The patient has home health care and an at-home doctor who can administer IV iron.         REVIEW OF SYSTEMS:   Constitutional: Denies fevers, chills or abnormal weight loss Eyes: Denies blurriness of vision Ears, nose, mouth, throat, and face: Denies mucositis or sore throat Respiratory:  Denies cough, dyspnea or wheezes Cardiovascular: Denies palpitation, chest discomfort or lower extremity swelling Gastrointestinal:  Denies nausea, heartburn or change in bowel habits Skin: Denies abnormal skin rashes Lymphatics: Denies new lymphadenopathy or easy bruising Neurological:Denies numbness, tingling or new weaknesses Behavioral/Psych: Mood is stable, no new changes  All other systems were reviewed with the patient and are negative.  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 after loss of parents, is fine   Vaginal Pap smear, abnormal    f/u wnl    SURGICAL HISTORY: Past Surgical History:  Procedure Laterality Date   BIOPSY  11/19/2022   Procedure: BIOPSY;  Surgeon: Lynann Bologna, DO;  Location: [facility];  Service: Gastroenterology;;   CERVICAL POLYPECTOMY  05/01/2012   Procedure: CERVICAL POLYPECTOMY;  Surgeon: Michael Litter, MD;  Location: [facility];  Service: Gynecology;  Laterality: N/A;   CESAREAN SECTION  07/04/2007   CHOLECYSTECTOMY  07/06/2013   CHOLECYSTECTOMY N/A 07/06/2013   Procedure: LAPAROSCOPIC CHOLECYSTECTOMY ;  Surgeon: [name], MD;  Location: [facility];  Service: General;  Laterality: N/A;  attempted cholangiogram   COLONOSCOPY WITH PROPOFOL N/A 11/19/2022   Procedure: COLONOSCOPY WITH PROPOFOL;   Surgeon: Lynann Bologna, DO;  Location: [facility];  Service: Gastroenterology;  Laterality: N/A;   fibroidecto     fibroidectomy  07/04/2007   LAPAROSCOPIC PARTIAL RIGHT COLECTOMY N/A 11/22/2022   Procedure: LAPAROSCOPIC RIGHT COLECTOMY WITH ANASTOMOSIS, SIGMOID RESECTION WITH COLOSTOMY, AND BIOPSY OF ABDOMINAL SKIN;  Surgeon: Kinsinger, De Blanch, MD;  Location: [facility];  Service: General;  Laterality: N/A;   LUMBAR LAMINECTOMY/DECOMPRESSION MICRODISCECTOMY N/A 01/19/2019   Procedure: Thoracic Nine-Lumbar One Decompressive Laminectomy;  Surgeon: Julio Sicks, MD;  Location: [facility] OR;  Service: Neurosurgery;  Laterality: N/A;  posterior   MYOMECTOMY  07/04/08   OPERATIVE HYSTEROSCOPY  05/01/2012   no menses since that time   SUBMUCOSAL TATTOO INJECTION  11/19/2022   Procedure: SUBMUCOSAL TATTOO INJECTION;  Surgeon: Lynann Bologna, DO;  Location: [facility];  Service: Gastroenterology;;   TUBAL LIGATION  07/04/2007    I have reviewed the social history and family history with the patient and they are unchanged from previous note.  ALLERGIES:  is allergic to bentyl [dicyclomine].  MEDICATIONS:  Current Outpatient Medications  Medication Sig Dispense Refill   acetaminophen (TYLENOL) 500  MG tablet Take 2 tablets (1,000 mg total) by mouth every 6 (six) hours. 30 tablet 0   baclofen (LIORESAL) 10 MG tablet TAKE 1 TABLET TWICE A DAY AT 8 AM AND 1 PM WITH 5 MG TABLET. (Patient taking differently: Take 10 mg by mouth 2 (two) times daily.) 180 tablet 1   baclofen (LIORESAL) 20 MG tablet TAKE 1 TABLET EVERY DAY AT 6 PM (Patient taking differently: Take 20 mg by mouth at bedtime.) 30 tablet 4   cetirizine (ZYRTEC) 10 MG tablet Take 10 mg by mouth daily.     diphenhydrAMINE (BENADRYL) 25 mg capsule Take 25 mg by mouth every 6 (six) hours as needed for itching or allergies.     gabapentin (NEURONTIN) 600 MG tablet Take 1 tablet (600 mg total) by mouth 3 (three) times daily. 90 tablet 1   hydrOXYzine  (ATARAX) 25 MG tablet Take 1 tablet (25 mg total) by mouth 2 (two) times daily as needed for itching. 30 tablet 0   megestrol (MEGACE) 40 MG tablet Take 2 tablets (80 mg total) by mouth 2 (two) times daily. 60 tablet 0   nystatin (MYCOSTATIN/NYSTOP) powder Apply 1 Application topically 2 times daily at 12 noon and 4 pm. 60 g 0   oxyCODONE-acetaminophen (PERCOCET/ROXICET) 5-325 MG tablet Take 1 tablet by mouth every 8 (eight) hours as needed for severe pain.     pantoprazole (PROTONIX) 40 MG tablet Take 1 tablet (40 mg total) by mouth daily. 30 tablet 2   traZODone (DESYREL) 50 MG tablet Take 1 tablet (50 mg total) by mouth at bedtime as needed for sleep. 30 tablet 0   No current facility-administered medications for this visit.    PHYSICAL EXAMINATION: Not performed   LABORATORY DATA:  I have reviewed the data as listed    Latest Ref Rng & Units 10/28/2023    2:31 PM 10/28/2023    2:30 PM 05/13/2023    2:50 PM  CBC  WBC 4.0 - 10.5 K/uL 8.3   7.8   Hemoglobin 12.0 - 15.0 g/dL 32.3   9.4   Hematocrit 36.0 - 46.0 % 33.7  35.0  31.7   Platelets 150 - 400 K/uL 233   332         Latest Ref Rng & Units 05/13/2023    2:50 PM 12/06/2022    3:57 AM 12/05/2022   11:00 AM  CMP  Glucose 70 - 99 mg/dL 97  87  95   BUN 6 - 20 mg/dL 7  13  12    Creatinine 0.44 - 1.00 mg/dL 5.57  3.22  0.25   Sodium 135 - 145 mmol/L 141  144  139   Potassium 3.5 - 5.1 mmol/L 3.5  3.7  3.5   Chloride 98 - 111 mmol/L 114  114  110   CO2 22 - 32 mmol/L 19  23  24    Calcium 8.9 - 10.3 mg/dL 8.4  8.0  8.4   Total Protein 6.5 - 8.1 g/dL 7.2  5.7    Total Bilirubin 0.3 - 1.2 mg/dL 0.4  0.4    Alkaline Phos 38 - 126 U/L 63  60    AST 15 - 41 U/L 17  24    ALT 0 - 44 U/L 12  33        RADIOGRAPHIC STUDIES: I have personally reviewed the radiological images as listed and agreed with the findings in the report. No results found.     I discussed the  assessment and treatment plan with the patient. The patient  was provided an opportunity to ask questions and all were answered. The patient agreed with the plan and demonstrated an understanding of the instructions.   The patient was advised to call back or seek an in-person evaluation if the symptoms worsen or if the condition fails to improve as anticipated.  I provided 21 minutes of non face-to-face telephone visit time during this encounter, and > 50% was spent counseling as documented under my assessment & plan.     [name], MD 11/04/23

## 2023-11-04 NOTE — Assessment & Plan Note (Signed)
Longstanding anemia, with previous lab evidence of iron deficiency, likely due to heavy menstrual bleeding and colon cancer. Currently on Ferrous Sulfate 325mg  daily for the past two months. No significant improvement noted. -Her previous lab also showed a low folate and B12 level -Repeated lab on October 24, 2023 showed slightly low iron with ferritin 12, and B12 level 214 -I recommend oral iron and B12 supplement

## 2023-11-06 ENCOUNTER — Telehealth: Payer: Self-pay | Admitting: Hematology

## 2023-12-02 ENCOUNTER — Telehealth: Payer: Self-pay

## 2023-12-02 ENCOUNTER — Other Ambulatory Visit: Payer: Self-pay

## 2023-12-02 NOTE — Telephone Encounter (Signed)
Received call from pt's PCP Home Nurse Nicki Guadalajara) regarding pt's IV Iron infusions.  Nicki Guadalajara stated that she reviewed Dr. Latanya Maudlin last office note with pt and it indicated that Dr. Mosetta Putt would be setting up IV Iron transfusions with pt's PCP Home Nurse which they are unable to do home infusions.  Nicki Guadalajara said the pt is a difficult stick and that she doesn't know of any home health agencies that to home infusions of IV Iron.  Nicki Guadalajara stated the pt is a home bound pt d/t previous health condition.  Nicki Guadalajara also stated the is non-compliant in taking the oral iron, prenatal vitamin, and oral B12 supplement Dr. Mosetta Putt recommended.  Asked Nicki Guadalajara why the pt was referred to Dr. Mosetta Putt for anemia because the pt has an hx of stage 2 colon cancer which she has an oncologist that should be f/u with pt.  Nicki Guadalajara stated pt was noncompliant w/f/u with oncologist after resection so Nicki Guadalajara is unaware of who the pt's oncologist.  Stated Dr. Mosetta Putt is an Oncologist and this nurse will ask Dr. Mosetta Putt if she would like to cover pt for both anemia and colon cancer.  Dr. Mosetta Putt stated Yes.  Informed Dr. Mosetta Putt that pt is noncompliant in taking medications as directed per Nicki Guadalajara.  Dr. Mosetta Putt reviewed pt's labs and stated the pt is fine for now but will make adjustment if anything changes.  Informed Nicki Guadalajara that this nurse will speak with Dr. Mosetta Putt regarding appt.

## 2024-06-10 ENCOUNTER — Other Ambulatory Visit: Payer: Self-pay

## 2024-06-10 DIAGNOSIS — Z1231 Encounter for screening mammogram for malignant neoplasm of breast: Secondary | ICD-10-CM

## 2024-09-10 ENCOUNTER — Ambulatory Visit
Admission: RE | Admit: 2024-09-10 | Discharge: 2024-09-10 | Disposition: A | Discharge status: Home / Self Care | Source: Ambulatory Visit

## 2024-09-10 DIAGNOSIS — Z1231 Encounter for screening mammogram for malignant neoplasm of breast: Secondary | ICD-10-CM

## 2025-01-05 ENCOUNTER — Ambulatory Visit: Payer: Self-pay | Admitting: Nurse Practitioner
